# Patient Record
Sex: Female | Born: 1960 | Race: Black or African American | Hispanic: No | Marital: Married | Smoking: Current some day smoker
Health system: Southern US, Community
[De-identification: ages and names within clinical notes are randomized; demographics above are authoritative.]

## PROBLEM LIST (undated history)

## (undated) DIAGNOSIS — I2510 Atherosclerotic heart disease of native coronary artery without angina pectoris: Secondary | ICD-10-CM

## (undated) DIAGNOSIS — I10 Essential (primary) hypertension: Secondary | ICD-10-CM

## (undated) DIAGNOSIS — J449 Chronic obstructive pulmonary disease, unspecified: Secondary | ICD-10-CM

## (undated) DIAGNOSIS — F32A Depression, unspecified: Secondary | ICD-10-CM

## (undated) DIAGNOSIS — I5030 Unspecified diastolic (congestive) heart failure: Secondary | ICD-10-CM

## (undated) DIAGNOSIS — G5600 Carpal tunnel syndrome, unspecified upper limb: Secondary | ICD-10-CM

## (undated) DIAGNOSIS — M1990 Unspecified osteoarthritis, unspecified site: Secondary | ICD-10-CM

## (undated) DIAGNOSIS — G5700 Lesion of sciatic nerve, unspecified lower limb: Secondary | ICD-10-CM

## (undated) DIAGNOSIS — F329 Major depressive disorder, single episode, unspecified: Secondary | ICD-10-CM

## (undated) DIAGNOSIS — G43909 Migraine, unspecified, not intractable, without status migrainosus: Secondary | ICD-10-CM

## (undated) DIAGNOSIS — M797 Fibromyalgia: Secondary | ICD-10-CM

## (undated) DIAGNOSIS — E785 Hyperlipidemia, unspecified: Secondary | ICD-10-CM

## (undated) DIAGNOSIS — M810 Age-related osteoporosis without current pathological fracture: Secondary | ICD-10-CM

## (undated) DIAGNOSIS — G4730 Sleep apnea, unspecified: Secondary | ICD-10-CM

## (undated) HISTORY — DX: Lesion of sciatic nerve, unspecified lower limb: G57.00

## (undated) HISTORY — PX: APPENDECTOMY: SHX54

## (undated) HISTORY — DX: Carpal tunnel syndrome, unspecified upper limb: G56.00

## (undated) HISTORY — DX: Age-related osteoporosis without current pathological fracture: M81.0

---

## 2004-10-12 ENCOUNTER — Encounter: Admission: RE | Admit: 2004-10-12 | Discharge: 2004-10-12 | Payer: Self-pay | Admitting: Family Medicine

## 2005-08-24 ENCOUNTER — Other Ambulatory Visit: Admission: RE | Admit: 2005-08-24 | Discharge: 2005-08-24 | Payer: Self-pay | Admitting: Obstetrics and Gynecology

## 2005-09-28 ENCOUNTER — Encounter: Admission: RE | Admit: 2005-09-28 | Discharge: 2005-09-28 | Payer: Self-pay | Admitting: Obstetrics and Gynecology

## 2007-08-15 ENCOUNTER — Emergency Department: Payer: Self-pay | Admitting: Emergency Medicine

## 2010-08-19 ENCOUNTER — Observation Stay: Payer: Self-pay | Admitting: Surgery

## 2010-08-20 LAB — PATHOLOGY REPORT

## 2010-09-02 ENCOUNTER — Ambulatory Visit: Payer: Self-pay | Admitting: Surgery

## 2011-12-21 ENCOUNTER — Telehealth: Payer: Self-pay | Admitting: Hematology and Oncology

## 2011-12-21 NOTE — Telephone Encounter (Signed)
lmonvm for pt re calling me for appt w/LO 

## 2011-12-22 ENCOUNTER — Telehealth: Payer: Self-pay | Admitting: Hematology and Oncology

## 2011-12-22 NOTE — Telephone Encounter (Signed)
S/w pt re appt for 2/12 @ 10 am w/LO.

## 2011-12-24 ENCOUNTER — Telehealth: Payer: Self-pay | Admitting: Hematology and Oncology

## 2011-12-24 NOTE — Telephone Encounter (Signed)
Del.Chart °

## 2011-12-28 ENCOUNTER — Ambulatory Visit (HOSPITAL_BASED_OUTPATIENT_CLINIC_OR_DEPARTMENT_OTHER): Payer: Federal, State, Local not specified - PPO | Admitting: Hematology and Oncology

## 2011-12-28 ENCOUNTER — Ambulatory Visit: Payer: Self-pay

## 2011-12-28 ENCOUNTER — Ambulatory Visit: Payer: Federal, State, Local not specified - PPO | Admitting: Lab

## 2011-12-28 VITALS — BP 122/86 | HR 79 | Temp 97.1°F | Ht 65.0 in | Wt 174.8 lb

## 2011-12-28 DIAGNOSIS — R238 Other skin changes: Secondary | ICD-10-CM

## 2011-12-28 DIAGNOSIS — D472 Monoclonal gammopathy: Secondary | ICD-10-CM

## 2011-12-28 DIAGNOSIS — R233 Spontaneous ecchymoses: Secondary | ICD-10-CM

## 2011-12-28 LAB — CBC WITH DIFFERENTIAL/PLATELET
BASO%: 0.1 % (ref 0.0–2.0)
Eosinophils Absolute: 0.2 10*3/uL (ref 0.0–0.5)
HCT: 43.6 % (ref 34.8–46.6)
MCHC: 32.8 g/dL (ref 31.5–36.0)
MONO#: 0.4 10*3/uL (ref 0.1–0.9)
NEUT#: 6.9 10*3/uL — ABNORMAL HIGH (ref 1.5–6.5)
NEUT%: 77 % — ABNORMAL HIGH (ref 38.4–76.8)
WBC: 8.9 10*3/uL (ref 3.9–10.3)
lymph#: 1.4 10*3/uL (ref 0.9–3.3)

## 2011-12-28 NOTE — Progress Notes (Signed)
Dr.    Mila Palmer     -      Primary. Dr.    Fatima Sanger      -      Rheumatologist.  [facility]    At   [facility].    Rd.  Cell        [phone].

## 2011-12-28 NOTE — Progress Notes (Signed)
This office note has been dictated.

## 2011-12-28 NOTE — Progress Notes (Signed)
CC:   Emeterio Reeve, MD Pollyann Savoy, M.D.  IDENTIFYING STATEMENT:  The patient is a 51 year old woman seen at the request of Dr. Corliss Skains with an elevated IgG level.  HISTORY OF PRESENT ILLNESS:  Mrs. Spinola was recently diagnosed with fibromyalgia.  She also has history of migraines and myofascial pain. During her workup, she was found to have abnormal lab work.  The results obtained through Dr. Fatima Sanger office on 12/03/2011 noted SPEP with no monoclonal protein.  IgG was slightly elevated at 2,120.  IgA was normal at 296 and IgM was 252.  The patient does have generalized muscle aches and joint pain.  She has not lost weight.  She denies fever, chills, night sweats, and adenopathy.  She does note some easy bruising.  The patient also notes that her paternal aunts had similar complaints.  She denies history of menorrhagia or gum bleeding.  She has not had any rectal bleeding.  PAST MEDICAL HISTORY: 1. Fibromyalgia. 2. Migraines. 3. Myofascial pain syndrome. 4. Glaucoma. 5. Status post appendectomy.  ALLERGIES:  None.  MEDICATIONS: 1. Skelaxin 800 mg q.i.d. 2. Topamax 200 mg at bedtime. 3. Sertraline 50 mg daily. 4. Lyrica 50 mg p.r.n. q.i.d. 5. Vitamin D 50,000 units weekly.  SOCIAL HISTORY:  The patient is married with 1 child.  She works for the [employer]. She has smoked one-half pack per day for 20 years.  She drinks a glass of wine at night.  FAMILY HISTORY:  Patient's father had liver cancer.  Patient's paternal aunts had easy bruising.  No family history for hematological disorders or malignancy.  REVIEW OF SYSTEMS:  Constitutional:  She denies fever, chills, night sweats, anorexia, weight loss.  Cardiovascular:  She denies chest pain, PND, orthopnea, ankle swelling.  Respirations:  She denies cough, hemoptysis, wheeze, shortness of breath.  GI:  She denies nausea, vomiting, abdominal pain, diarrhea, melena, hematochezia.  GU:  She denies  dysuria, hematuria, nocturia, frequency.  Skin:  Notes easy bruising.  She denies joint effusions.  She admits to generalized muscle aches and pains.  Neurologic:  She denies headache, vision change, extremity weakness.  The rest of the review of systems is negative.  PHYSICAL EXAMINATION:  General Appearance:  The patient is alert and oriented x3.  Vital Signs:  Pulse 79.  Blood pressure 122/86.  Temp 97.1.  Respirations 16.  Weight 174 pounds.  HEENT:  Head is atraumatic, normocephalic.  Extraocular muscles are intact.  Sclerae are anicteric. Pupils are equal, round, and reactive to light.  Mouth is moist without ulcerations, thrush, or lesions.  Neck:  Supple without adenopathy and trachea is central.  Chest:  Demonstrates good air entry bilaterally. Clear to both percussion and auscultation.  Cardiovascular Exam:  First and second heart sounds were present.  No added sounds or murmurs. Abdomen:  Soft, nontender.  No hepatomegaly.  Bowel sounds present. Extremities:  No calf tenderness.  CNS:  Nonfocal.  IMPRESSION AND PLAN:  Mrs. Foxworthy is a 51 year old woman referred with an abnormal SPEP with slightly elevated IgG levels.  She was diagnosed with fibromyalgia.  We spent more than half the time discussing possible etiologies.  We also reviewed the causes of easy bruising.  Mrs. Jenison will be further evaluated with blood work.  She will proceed with repeat a CBC.  We will review morphology.  We will also obtain a comprehensive metabolic panel.  I suggest we do an SPEP with immunoglobulin levels and serum light chain.  We will also  obtain a bone survey.  She has a significant history for easy bruisability.  We will need to rule out von Willebrand's disease and, therefore, we will obtain a PT, PTT, and von Willebrand panel.  The patient had a number of questions, which were all answered to her satisfaction.  She follows up to discuss results and further  recommendations.    ______________________________ Laurice Record, M.D. LIO/MEDQ  D:  12/28/2011  T:  12/28/2011  Job:  [id number]

## 2011-12-31 LAB — COMPREHENSIVE METABOLIC PANEL
ALT: 21 U/L (ref 0–35)
CO2: 20 mEq/L (ref 19–32)
Calcium: 10.6 mg/dL — ABNORMAL HIGH (ref 8.4–10.5)
Chloride: 111 mEq/L (ref 96–112)
Creatinine, Ser: 0.85 mg/dL (ref 0.50–1.10)
Sodium: 141 mEq/L (ref 135–145)
Total Protein: 7.5 g/dL (ref 6.0–8.3)

## 2011-12-31 LAB — VON WILLEBRAND FACTOR MULTIMER: Factor-VIII Activity: 122 % (ref 50–180)

## 2011-12-31 LAB — KAPPA/LAMBDA LIGHT CHAINS: Kappa:Lambda Ratio: 1.01 (ref 0.26–1.65)

## 2011-12-31 LAB — SPEP & IFE WITH QIG
Alpha-2-Globulin: 9.1 % (ref 7.1–11.8)
Beta 2: 5.3 % (ref 3.2–6.5)
Beta Globulin: 8.9 % — ABNORMAL HIGH (ref 4.7–7.2)
Gamma Globulin: 15.7 % (ref 11.1–18.8)
IgG (Immunoglobin G), Serum: 1240 mg/dL (ref 690–1700)

## 2012-01-05 ENCOUNTER — Other Ambulatory Visit (HOSPITAL_COMMUNITY): Payer: Federal, State, Local not specified - PPO

## 2012-01-11 ENCOUNTER — Ambulatory Visit (HOSPITAL_BASED_OUTPATIENT_CLINIC_OR_DEPARTMENT_OTHER): Payer: Federal, State, Local not specified - PPO | Admitting: Hematology and Oncology

## 2012-01-11 ENCOUNTER — Encounter: Payer: Self-pay | Admitting: Hematology and Oncology

## 2012-01-11 ENCOUNTER — Ambulatory Visit: Payer: Federal, State, Local not specified - PPO | Admitting: Lab

## 2012-01-11 ENCOUNTER — Telehealth: Payer: Self-pay | Admitting: Hematology and Oncology

## 2012-01-11 VITALS — BP 128/90 | HR 66 | Temp 97.1°F | Ht 65.0 in | Wt 179.0 lb

## 2012-01-11 DIAGNOSIS — M797 Fibromyalgia: Secondary | ICD-10-CM | POA: Insufficient documentation

## 2012-01-11 DIAGNOSIS — I998 Other disorder of circulatory system: Secondary | ICD-10-CM

## 2012-01-11 DIAGNOSIS — IMO0001 Reserved for inherently not codable concepts without codable children: Secondary | ICD-10-CM

## 2012-01-11 DIAGNOSIS — D472 Monoclonal gammopathy: Secondary | ICD-10-CM

## 2012-01-11 DIAGNOSIS — T148XXA Other injury of unspecified body region, initial encounter: Secondary | ICD-10-CM

## 2012-01-11 DIAGNOSIS — R238 Other skin changes: Secondary | ICD-10-CM

## 2012-01-11 NOTE — Progress Notes (Signed)
CC:   Teresa Reeve, MD Teresa Roberts, M.D.  IDENTIFYING STATEMENT:  Patient is a 51 year old woman who presents to discuss results.  INTERIM HISTORY:  In summary, the patient was diagnosed with fibromyalgia and, during workup, was found to have elevated immunoglobulins.  She is here to discuss results.  In addition, she had reported easy bruisability.  Otherwise, she is fairly asymptomatic.  RESULTS:  On 12/28/2011 notes a white blood cell of 8.9, hemoglobin 14.3, hematocrit 43.6, platelets 235; morphology was essentially unremarkable with normal-appearing white blood cells, red blood cells and platelets; BUN and creatinine 18 and 0.85 respectively; calcium 10.6; serum protein electrophoresis did not reveal an M-spike, but the possibility of a faint restricted band could not be completely excluded. Immunoglobulins IgG was normal 1240, IgA was slightly elevated at 389, and IgM was normal at 231.  The patient did not proceed for bone survey. Von Willebrand multimers were essentially normal with factor VIII activity of 122%, von Willebrand's antigen 139, ristocetin cofactor normal at 88% and all multimers were present in normal amounts with no lab evidence for von Willebrand's disease.  MEDICATIONS:  Reviewed and updated.  ALLERGIES:  None.  Past medical history, family history, social history unchanged.  REVIEW OF SYSTEMS:  Ten-point review of systems negative.  PHYSICAL EXAM:  The patient is a well-appearing well-nourished woman in no distress.  Vitals:  Pulse 66, blood pressure 128/93, temperature 97.1, respirations 20, weight 179 pounds.  HEENT:  Head is atraumatic, normocephalic.  Sclerae anicteric.  Mouth moist.  Neck:  Supple.  Chest: Clear.  CVS:  First and second heart sounds present.  No added sounds or murmurs.  Abdomen:  Soft, nontender.  Bowel sounds present. Extremities:  No edema.  Skin:  No petechiae or bruising evident.  CNS: Nonfocal.  Lymph nodes:  No  adenopathy.  LAB DATA:  As above.  In addition, sodium 141, potassium 4.3, chloride 111, CO2 20, glucose 89, total bilirubin 0.4, alkaline phosphatase 120, AST 22, ALT 21.  IMPRESSION AND PLAN:  Teresa Roberts is a 51 year old woman with: 1. 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  I would like for her to go ahead and get her bone     survey which she needs to reschedule.  She has promised to do so.     We discussed that no treatment is necessary at this time except for     surveillance.  She thus follows up in a year's time with protein     markers and repeat bone survey. 2. Easy bruisability with normal coagulation and von Willebrand     multimers.  She will proceed for platelet aggregation studies.  We     will telephone her with those results.  I spent more than half the time coordinating care.    ______________________________ Laurice Record, M.D. LIO/MEDQ  D:  01/11/2012  T:  01/11/2012  Job:  [id number]

## 2012-01-11 NOTE — Patient Instructions (Signed)
Patient to follow up as instructed.  

## 2012-01-11 NOTE — Telephone Encounter (Signed)
Sent pt back to labs.  scheduled pt for bone survey for 01/18/2012 [facility] and 01/02/2013 @ [facility].  Gv pt appt for [phone]

## 2012-01-11 NOTE — Progress Notes (Signed)
This office note has been dictated.

## 2012-01-12 ENCOUNTER — Other Ambulatory Visit (HOSPITAL_BASED_OUTPATIENT_CLINIC_OR_DEPARTMENT_OTHER): Payer: Federal, State, Local not specified - PPO | Admitting: Lab

## 2012-01-12 DIAGNOSIS — M797 Fibromyalgia: Secondary | ICD-10-CM

## 2012-01-12 DIAGNOSIS — R238 Other skin changes: Secondary | ICD-10-CM

## 2012-01-12 DIAGNOSIS — T148XXA Other injury of unspecified body region, initial encounter: Secondary | ICD-10-CM

## 2012-01-12 DIAGNOSIS — D472 Monoclonal gammopathy: Secondary | ICD-10-CM

## 2012-01-14 LAB — PLATELET AGGREGATION STUDY, BLOOD: Ristocetin: DECREASED

## 2012-01-18 ENCOUNTER — Other Ambulatory Visit (HOSPITAL_COMMUNITY): Payer: Federal, State, Local not specified - PPO

## 2012-10-19 ENCOUNTER — Encounter (HOSPITAL_COMMUNITY): Payer: Self-pay

## 2012-10-19 ENCOUNTER — Emergency Department (HOSPITAL_COMMUNITY)
Admission: EM | Admit: 2012-10-19 | Discharge: 2012-10-19 | Discharge status: Against Medical Advice | Payer: Federal, State, Local not specified - PPO | Attending: Emergency Medicine | Admitting: Emergency Medicine

## 2012-10-19 DIAGNOSIS — F17200 Nicotine dependence, unspecified, uncomplicated: Secondary | ICD-10-CM | POA: Insufficient documentation

## 2012-10-19 DIAGNOSIS — Z5320 Procedure and treatment not carried out because of patient's decision for unspecified reasons: Secondary | ICD-10-CM | POA: Insufficient documentation

## 2012-10-19 HISTORY — DX: Fibromyalgia: M79.7

## 2012-10-19 HISTORY — DX: Migraine, unspecified, not intractable, without status migrainosus: G43.909

## 2012-10-19 NOTE — ED Notes (Signed)
Pt presents with 2 day h/o generalized body pain.  Pt reports h/o fibromyalgia and states this pain is worse, she is unable to get out of the bed, is unable to lift her arms.  Pt reports 3 days ago, she had episodes of vomiting and fever.  Pt reports pain medication that she is prescribed is not working.  Pt also reports migraine headache that began x 2 days ago.  Pt reports pain is occipital (where she normally gets pain) and that prescribed medication is not working either.

## 2012-10-19 NOTE — ED Notes (Signed)
Pt did not answer x 2 

## 2012-10-19 NOTE — ED Notes (Signed)
Pt did not answer x 1 

## 2012-11-18 ENCOUNTER — Telehealth: Payer: Self-pay | Admitting: Oncology

## 2012-11-18 NOTE — Telephone Encounter (Signed)
Talked to patient, she is in a funeral and out of town. She will call us to r/s appt to Dr. Arline Asp. I will also follow up with patient on Monday

## 2013-01-02 ENCOUNTER — Other Ambulatory Visit (HOSPITAL_COMMUNITY): Payer: Federal, State, Local not specified - PPO

## 2013-01-02 ENCOUNTER — Other Ambulatory Visit: Payer: Federal, State, Local not specified - PPO | Admitting: Lab

## 2013-01-09 ENCOUNTER — Ambulatory Visit: Payer: Federal, State, Local not specified - PPO | Admitting: Hematology and Oncology

## 2014-02-04 ENCOUNTER — Other Ambulatory Visit: Payer: Self-pay | Admitting: Family Medicine

## 2014-02-04 ENCOUNTER — Inpatient Hospital Stay (HOSPITAL_COMMUNITY): Admit: 2014-02-04 | Payer: Self-pay

## 2014-02-04 ENCOUNTER — Other Ambulatory Visit (HOSPITAL_COMMUNITY)
Admission: RE | Admit: 2014-02-04 | Discharge: 2014-02-04 | Disposition: A | Discharge status: Home / Self Care | Payer: 59 | Source: Ambulatory Visit | Attending: Family Medicine | Admitting: Family Medicine

## 2014-02-04 DIAGNOSIS — Z1151 Encounter for screening for human papillomavirus (HPV): Secondary | ICD-10-CM | POA: Insufficient documentation

## 2014-02-04 DIAGNOSIS — Z124 Encounter for screening for malignant neoplasm of cervix: Secondary | ICD-10-CM | POA: Insufficient documentation

## 2014-04-15 ENCOUNTER — Telehealth: Payer: Self-pay | Admitting: Internal Medicine

## 2014-04-15 NOTE — Telephone Encounter (Signed)
S/W PATIENT AND GAVE NP APPT FOR 06/02 @ 10:30 W/DR. CHISM.  REFERRING -[facility] DX- MGUS

## 2014-04-15 NOTE — Telephone Encounter (Signed)
C/D 04/15/14 for appt. 04/16/14

## 2014-04-16 ENCOUNTER — Other Ambulatory Visit: Payer: 59

## 2014-04-16 ENCOUNTER — Ambulatory Visit (HOSPITAL_BASED_OUTPATIENT_CLINIC_OR_DEPARTMENT_OTHER): Payer: 59

## 2014-04-16 ENCOUNTER — Encounter: Payer: Self-pay | Admitting: Internal Medicine

## 2014-04-16 ENCOUNTER — Ambulatory Visit: Payer: Federal, State, Local not specified - PPO

## 2014-04-16 ENCOUNTER — Telehealth: Payer: Self-pay | Admitting: Internal Medicine

## 2014-04-16 ENCOUNTER — Ambulatory Visit (HOSPITAL_BASED_OUTPATIENT_CLINIC_OR_DEPARTMENT_OTHER): Payer: 59 | Admitting: Internal Medicine

## 2014-04-16 VITALS — BP 119/86 | HR 83 | Temp 98.1°F | Resp 18 | Ht 65.0 in | Wt 189.7 lb

## 2014-04-16 DIAGNOSIS — D472 Monoclonal gammopathy: Secondary | ICD-10-CM

## 2014-04-16 LAB — CBC WITH DIFFERENTIAL/PLATELET
BASO%: 1.2 % (ref 0.0–2.0)
BASOS ABS: 0.1 10*3/uL (ref 0.0–0.1)
EOS ABS: 0.1 10*3/uL (ref 0.0–0.5)
EOS%: 1.6 % (ref 0.0–7.0)
HCT: 50.5 % — ABNORMAL HIGH (ref 34.8–46.6)
HEMOGLOBIN: 16.3 g/dL — AB (ref 11.6–15.9)
LYMPH%: 16.9 % (ref 14.0–49.7)
MCH: 31.8 pg (ref 25.1–34.0)
MCHC: 32.3 g/dL (ref 31.5–36.0)
MCV: 98.4 fL (ref 79.5–101.0)
MONO#: 0.5 10*3/uL (ref 0.1–0.9)
MONO%: 6.2 % (ref 0.0–14.0)
NEUT%: 74.1 % (ref 38.4–76.8)
NEUTROS ABS: 6.1 10*3/uL (ref 1.5–6.5)
PLATELETS: 213 10*3/uL (ref 145–400)
RBC: 5.13 10*6/uL (ref 3.70–5.45)
RDW: 18.3 % — AB (ref 11.2–14.5)
WBC: 8.2 10*3/uL (ref 3.9–10.3)
lymph#: 1.4 10*3/uL (ref 0.9–3.3)

## 2014-04-16 LAB — COMPREHENSIVE METABOLIC PANEL (CC13)
ALK PHOS: 108 U/L (ref 40–150)
ALT: 11 U/L (ref 0–55)
AST: 12 U/L (ref 5–34)
Albumin: 3.5 g/dL (ref 3.5–5.0)
Anion Gap: 14 mEq/L — ABNORMAL HIGH (ref 3–11)
BUN: 12.2 mg/dL (ref 7.0–26.0)
CALCIUM: 9.2 mg/dL (ref 8.4–10.4)
CO2: 21 mEq/L — ABNORMAL LOW (ref 22–29)
Chloride: 106 mEq/L (ref 98–109)
Creatinine: 0.8 mg/dL (ref 0.6–1.1)
Glucose: 88 mg/dl (ref 70–140)
Potassium: 4.3 mEq/L (ref 3.5–5.1)
Sodium: 142 mEq/L (ref 136–145)
Total Bilirubin: 0.43 mg/dL (ref 0.20–1.20)
Total Protein: 7.2 g/dL (ref 6.4–8.3)

## 2014-04-16 LAB — LACTATE DEHYDROGENASE (CC13): LDH: 224 U/L (ref 125–245)

## 2014-04-16 NOTE — Progress Notes (Signed)
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  She was last seen by Dr. Sophronia Simas on 01/17/2012. She saw Dr. Stephanie Acre on 04/11/2014.   She was diagnosed with fibromyalgia in 2013. During her workup, she was found to have abnormal lab work. The results obtained through Dr. Arlean Hopping office on 12/03/2011 noted SPEP with no monoclonal protein. IgG was slightly elevated at 2,120. IgA was normal at 296 and IgM was 252. She was seen by Dr. Jamse Arn on 12/28/2011.  The results of her studies are as follows: A white blood cell of 8.9, hemoglobin 14.3, hematocrit 43.6, platelets 235; morphology was essentially unremarkable with normal-appearing white blood cells, red blood cells and platelets; BUN and creatinine 18 and 0.85 respectively; calcium 10.6; serum protein electrophoresis did not reveal an M-spike, but the possibility of a faint restricted band could not be completely excluded. Immunoglobulins IgG was normal 1240, IgA was slightly elevated at 389, and IgM was normal at 231. The patient did not proceed for bone survey. Von Willebrand multimers were essentially normal with factor VIII activity of 122%, von Willebrand's antigen 139, ristocetin cofactor normal at 88% and all multimers were present in normal amounts with no lab evidence for von Willebrand's disease. A bone survey was ordered but not obtained. She has been lost to follow up.   Today, she reports continued pain in her arms, legs, back and legs.  She describes it as achy, sharp that is intermittent.  She started walking and exercising but the pain makes this not tolerable. She denies  fevers but does report night sweats.  She denies weight changes.  She is up-to-date for her mammogram (done at the [facility]) in August.  Dr. Stephanie Acre did her annual PAP in April of this year (reportedly negative for malignancy).  She has had a colonoscopy in 2012 with one polyp.  She cannot recall her GI group. She denies kidney problems.  She denies rectal bleeding.  Her last menstrual period was in 2014.  She is on disability since December, 2015.  She smokes one pack per day for the past twenty years.   PAST MEDICAL HISTORY:  has a past medical history of Migraines and Fibromyalgia.     PAST SURGICAL HISTORY: Past Surgical History  Procedure Laterality Date  . Cesarean section       CURRENT MEDICATIONS: has a current medication list which includes the following prescription(s): duloxetine, ergocalciferol, metaxalone, and pregabalin.   ALLERGIES: Review of patient's allergies indicates no known allergies.   SOCIAL HISTORY:  reports that she has been smoking.  She does not have any smokeless tobacco history on file. She reports that she drinks about .6 ounces of alcohol per week. She reports that she does not use illicit drugs.   FAMILY HISTORY: family history is not on file.   LABORATORY DATA:  CBC    Component Value Date/Time   WBC 8.2 04/16/2014 1212   RBC 5.13 04/16/2014 1212   HGB 16.3* 04/16/2014 1212   HCT 50.5* 04/16/2014 1212   PLT 213 04/16/2014 1212   MCV 98.4 04/16/2014 1212   MCH 31.8 04/16/2014 1212  MCHC 32.3 04/16/2014 1212   RDW 18.3* 04/16/2014 1212   LYMPHSABS 1.4 04/16/2014 1212   MONOABS 0.5 04/16/2014 1212   EOSABS 0.1 04/16/2014 1212   BASOSABS 0.1 04/16/2014 1212   CMP     Component Value Date/Time   NA 142 04/16/2014 1213   NA 141 12/28/2011 1137   K 4.3 04/16/2014 1213   K 4.3 12/28/2011 1137   CL 111 12/28/2011 1137   CO2 21* 04/16/2014 1213   CO2 20 12/28/2011 1137   GLUCOSE 88 04/16/2014 1213   GLUCOSE 89 12/28/2011 1137   BUN 12.2 04/16/2014 1213   BUN 18 12/28/2011  1137   CREATININE 0.8 04/16/2014 1213   CREATININE 0.85 12/28/2011 1137   CALCIUM 9.2 04/16/2014 1213   CALCIUM 10.6* 12/28/2011 1137   PROT 7.2 04/16/2014 1213   PROT 7.5 12/28/2011 1137   ALBUMIN 3.5 04/16/2014 1213   ALBUMIN 4.3 12/28/2011 1137   AST 12 04/16/2014 1213   AST 22 12/28/2011 1137   ALT 11 04/16/2014 1213   ALT 21 12/28/2011 1137   ALKPHOS 108 04/16/2014 1213   ALKPHOS 120* 12/28/2011 1137   BILITOT 0.43 04/16/2014 1213   BILITOT 0.4 12/28/2011 1137   Results for Umholtz, Irasema (MRN [id number]) as of 04/16/2014 14:30  Ref. Range 04/16/2014 12:12  [facility] Latest Range: 125-245 U/L 224     RADIOGRAPHY: No results found.     REVIEW OF SYSTEMS:  Constitutional: Denies fevers, chills or abnormal weight loss; per HPI.  She has fibromyalgia with chronic generalized pain.  Eyes: Denies blurriness of vision Ears, nose, mouth, throat, and face: Denies mucositis or sore throat Respiratory: Denies cough, dyspnea or wheezes Cardiovascular: Denies palpitation, chest discomfort or lower extremity swelling Gastrointestinal:  Denies nausea, heartburn or change in bowel habits Skin: Denies abnormal skin rashes Lymphatics: Denies new lymphadenopathy or easy bruising Neurological:Denies numbness, tingling or new weaknesses Behavioral/Psych: Mood is stable, no new changes  All other systems were reviewed with the patient and are negative.  PHYSICAL EXAM:  height is 5' 5"  (1.651 m) and weight is 189 lb 11.2 oz (86.047 kg). Her oral temperature is 98.1 F (36.7 C). Her blood pressure is 119/86 and her pulse is 83. Her respiration is 18 and oxygen saturation is 91%.    GENERAL:alert, no distress and comfortable; well developed and well nourished. Mildly obese.  SKIN: skin color, texture, turgor are normal, no rashes or significant lesions EYES: normal, Conjunctiva are pink and non-injected, sclera clear OROPHARYNX:no exudate, no erythema and lips, buccal mucosa, and tongue normal  NECK: supple, thyroid normal  size, non-tender, without nodularity LYMPH:  no palpable lymphadenopathy in the cervical, axillary or inguinal LUNGS: clear to auscultation and percussion with normal breathing effort HEART: regular rate & rhythm and no murmurs and no lower extremity edema ABDOMEN:abdomen soft, non-tender and normal bowel sounds Musculoskeletal:no cyanosis of digits and no clubbing  NEURO: alert & oriented x 3 with fluent speech, no focal motor/sensory deficits   IMPRESSION: Brendan Gruwell is a 53 y.o. female with a history of    PLAN:  1.  IgA MGUS.  --We will exclude Multiple myeloma or smothering MM by checking an UPEP with IFE, kappa lambda light chains and 24 hour total protein. In addition, we will evaluate her bones with a skeletal survey. She has a normal calcemia (9.2), creatinine and hemoglobin (slightly elevated at 16.3). We will have her back to discuss the results of her skeletal survey if positive at her request.  --  We discussed that patients with MGUS have a one percent per year chance of developing multiple myeloma. We discussed that no treatment is necessary at this time except for surveillance pending review of her studies as noted above.   2. Tobacco Abuse. --Patient declines tobacco cessation class. She was counseled extensively on the benefits of not smoking.   3. Follow-up. -Patient will have follow up in 6 months then annually if her MM markers are stable and no organ dysfunction.  We will repeat CBC and chemistries and multiple myeloma markers.   All questions were answered. The patient knows to call the clinic with any problems, questions or concerns. We can certainly see the patient much sooner if necessary.  I spent 25 minutes counseling the patient face to face. The total time spent in the appointment was 45 minutes.    Concha Norway, MD 04/16/2014 2:36 PM

## 2014-04-16 NOTE — Progress Notes (Signed)
Checked in new patient with no financial issues. She has not seen the dr. She has not been out of the country and didn't want a photo taken. She has appt card.,

## 2014-04-16 NOTE — Telephone Encounter (Signed)
gv adn printed appt sched and avs for pt for Sept due to pt moving to [facility]...sent pt to lab

## 2014-04-16 NOTE — Patient Instructions (Signed)
Menopause Menopause is the normal time of life when menstrual periods stop completely. Menopause is complete when you have missed 12 consecutive menstrual periods. It usually occurs between the ages of 8 years and 48 years. Very rarely does a woman develop menopause before the age of 60 years. At menopause, your ovaries stop producing the female hormones estrogen and progesterone. This can cause undesirable symptoms and also affect your health. Sometimes the symptoms may occur 4 5 years before the menopause begins. There is no relationship between menopause and:  Oral contraceptives.  Number of children you had.  Race.  The age your menstrual periods started (menarche). Heavy smokers and very thin women may develop menopause earlier in life. CAUSES  The ovaries stop producing the female hormones estrogen and progesterone.  Other causes include:  Surgery to remove both ovaries.  The ovaries stop functioning for no known reason.  Tumors of the pituitary gland in the brain.  Medical disease that affects the ovaries and hormone production.  Radiation treatment to the abdomen or pelvis.  Chemotherapy that affects the ovaries. SYMPTOMS   Hot flashes.  Night sweats.  Decrease in sex drive.  Vaginal dryness and thinning of the vagina causing painful intercourse.  Dryness of the skin and developing wrinkles.  Headaches.  Tiredness.  Irritability.  Memory problems.  Weight gain.  Bladder infections.  Hair growth of the face and chest.  Infertility. More serious symptoms include:  Loss of bone (osteoporosis) causing breaks (fractures).  Depression.  Hardening and narrowing of the arteries (atherosclerosis) causing heart attacks and strokes. DIAGNOSIS   When the menstrual periods have stopped for 12 straight months.  Physical exam.  Hormone studies of the blood. TREATMENT  There are many treatment choices and nearly as many questions about them. The  decisions to treat or not to treat menopausal changes is an individual choice made with your health care provider. Your health care provider can discuss the treatments with you. Together, you can decide which treatment will work best for you. Your treatment choices may include:   Hormone therapy (estrogen and progesterone).  Non-hormonal medicines.  Treating the individual symptoms with medicine (for example antidepressants for depression).  Herbal medicines that may help specific symptoms.  Counseling by a psychiatrist or psychologist.  Group therapy.  Lifestyle changes including:  Eating healthy.  Regular exercise.  Limiting caffeine and alcohol.  Stress management and meditation.  No treatment. HOME CARE INSTRUCTIONS   Take the medicine your health care provider gives you as directed.  Get plenty of sleep and rest.  Exercise regularly.  Eat a diet that contains calcium (good for the bones) and soy products (acts like estrogen hormone).  Avoid alcoholic beverages.  Do not smoke.  If you have hot flashes, dress in layers.  Take supplements, calcium, and vitamin D to strengthen bones.  You can use over-the-counter lubricants or moisturizers for vaginal dryness.  Group therapy is sometimes very helpful.  Acupuncture may be helpful in some cases. SEEK MEDICAL CARE IF:   You are not sure you are in menopause.  You are having menopausal symptoms and need advice and treatment.  You are still having menstrual periods after age 71 years.  You have pain with intercourse.  Menopause is complete (no menstrual period for 12 months) and you develop vaginal bleeding.  You need a referral to a specialist (gynecologist, psychiatrist, or psychologist) for treatment. SEEK IMMEDIATE MEDICAL CARE IF:   You have severe depression.  You have excessive vaginal bleeding.  You fell and think you have a broken bone.  You have pain when you urinate.  You develop leg or  chest pain.  You have a fast pounding heart beat (palpitations).  You have severe headaches.  You develop vision problems.  You feel a lump in your breast.  You have abdominal pain or severe indigestion. Document Released: 01/22/2004 Document Revised: 07/04/2013 Document Reviewed: 05/31/2013 [facility] Patient Information 2014 [place], [place].

## 2014-04-18 LAB — SPEP & IFE WITH QIG
ALBUMIN ELP: 55.6 % — AB (ref 55.8–66.1)
ALPHA-2-GLOBULIN: 9 % (ref 7.1–11.8)
Alpha-1-Globulin: 4.8 % (ref 2.9–4.9)
BETA 2: 5.4 % (ref 3.2–6.5)
Beta Globulin: 9.7 % — ABNORMAL HIGH (ref 4.7–7.2)
Gamma Globulin: 15.5 % (ref 11.1–18.8)
IGA: 386 mg/dL — AB (ref 69–380)
IgG (Immunoglobin G), Serum: 1220 mg/dL (ref 690–1700)
IgM, Serum: 191 mg/dL (ref 52–322)
M-Spike, %: 0.33 g/dL
Total Protein, Serum Electrophoresis: 6.9 g/dL (ref 6.0–8.3)

## 2014-04-18 LAB — KAPPA/LAMBDA LIGHT CHAINS
Kappa free light chain: 1.43 mg/dL (ref 0.33–1.94)
Kappa:Lambda Ratio: 1.3 (ref 0.26–1.65)
LAMBDA FREE LGHT CHN: 1.1 mg/dL (ref 0.57–2.63)

## 2014-04-22 ENCOUNTER — Ambulatory Visit (HOSPITAL_COMMUNITY)
Admission: RE | Admit: 2014-04-22 | Discharge: 2014-04-22 | Disposition: A | Discharge status: Home / Self Care | Payer: 59 | Source: Ambulatory Visit | Attending: Internal Medicine | Admitting: Internal Medicine

## 2014-04-22 DIAGNOSIS — D472 Monoclonal gammopathy: Secondary | ICD-10-CM | POA: Insufficient documentation

## 2014-05-21 LAB — UIFE/LIGHT CHAINS/TP QN, 24-HR UR
ALBUMIN, U: DETECTED
ALPHA 1 UR: DETECTED — AB
ALPHA 2 UR: DETECTED — AB
Beta, Urine: DETECTED — AB
FREE KAPPA/LAMBDA RATIO: 9.85 ratio (ref 2.04–10.37)
Free Kappa Lt Chains,Ur: 8.57 mg/dL — ABNORMAL HIGH (ref 0.14–2.42)
Free Lambda Excretion/Day: 13.49 mg/d
Free Lambda Lt Chains,Ur: 0.87 mg/dL — ABNORMAL HIGH (ref 0.02–0.67)
Free Lt Chn Excr Rate: 132.84 mg/d
Gamma Globulin, Urine: DETECTED — AB
Time: 24 hours
Total Protein, Urine-Ur/day: 163 mg/d — ABNORMAL HIGH (ref 10–140)
Total Protein, Urine: 10.5 mg/dL
Volume, Urine: 1550 mL

## 2014-05-24 ENCOUNTER — Telehealth: Payer: Self-pay

## 2014-05-24 NOTE — Telephone Encounter (Signed)
Pt called for results of labs drawn 6/2, labs printed and given to Dr Juliann Mule to interpret and call pt.

## 2014-05-30 ENCOUNTER — Telehealth: Payer: Self-pay | Admitting: Internal Medicine

## 2014-05-30 NOTE — Telephone Encounter (Signed)
lvm for pt regarding to appt .Marland KitchenMarland Kitchenpt requested soon appt

## 2014-05-31 ENCOUNTER — Telehealth: Payer: Self-pay | Admitting: Internal Medicine

## 2014-05-31 ENCOUNTER — Encounter: Payer: Self-pay | Admitting: Internal Medicine

## 2014-05-31 ENCOUNTER — Ambulatory Visit (HOSPITAL_BASED_OUTPATIENT_CLINIC_OR_DEPARTMENT_OTHER): Payer: 59 | Admitting: Internal Medicine

## 2014-05-31 VITALS — BP 129/85 | HR 90 | Temp 98.2°F | Resp 18 | Ht 65.0 in | Wt 189.2 lb

## 2014-05-31 DIAGNOSIS — D472 Monoclonal gammopathy: Secondary | ICD-10-CM

## 2014-05-31 DIAGNOSIS — F17200 Nicotine dependence, unspecified, uncomplicated: Secondary | ICD-10-CM

## 2014-05-31 DIAGNOSIS — IMO0001 Reserved for inherently not codable concepts without codable children: Secondary | ICD-10-CM

## 2014-05-31 NOTE — Progress Notes (Signed)
[facility] OFFICE PROGRESS NOTE  Lilian Coma, MD [address] [place] [address]  DIAGNOSIS: MGUS (monoclonal gammopathy of unknown significance) - Plan: CBC with Differential, Comprehensive metabolic panel (Cmet) - [facility], Lactate dehydrogenase (LDH) - [facility], Kappa/lambda light chains, SPEP & IFE with QIG  Chief Complaint  Patient presents with  . MGUS (monoclonal gammopathy of unknown significance)    CURRENT TREATMENT: Observation.  INTERVAL HISTORY: Teresa Roberts 53 y.o. 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  She was last seen by me on 04/16/2014. She was last seen by Dr. Sophronia Simas on 01/17/2012. She saw Dr. Stephanie Acre on 04/11/2014.   She was diagnosed with fibromyalgia in 2013. During her workup, she was found to have abnormal lab work. The results obtained through Dr. Arlean Hopping office on 12/03/2011 noted SPEP with no monoclonal protein. IgG was slightly elevated at 2,120. IgA was normal at 296 and IgM was 252. She was seen by Dr. Jamse Arn on 12/28/2011. The results of her studies are as follows: A white blood cell of 8.9, hemoglobin 14.3, hematocrit 43.6, platelets 235; morphology was essentially unremarkable with normal-appearing white blood cells, red blood cells and platelets; BUN and creatinine 18 and 0.85 respectively; calcium 10.6; serum protein electrophoresis did not reveal an M-spike, but the possibility of a faint restricted band could not be completely excluded. Immunoglobulins IgG was normal 1240, IgA was slightly elevated at 389, and IgM was normal at 231. The patient did not proceed for bone survey. Von Willebrand multimers were essentially normal with factor VIII activity of 122%, von Willebrand's antigen 139, ristocetin cofactor normal at 88% and all multimers were present in normal amounts with no lab evidence for von Willebrand's disease. A bone survey was ordered but not obtained. .   Today, she is  accompanied by her husband Shalandria Elsbernd. She reports continued pain in her arms, legs, back and legs that she attributes to her fibromyalgia. She describes it as achy, sharp that is intermittent. She started walking and exercising but the pain makes this not tolerable. She denies fevers but does report night sweats. She denies weight changes. She is up-to-date for her mammogram (done at the [facility] hospital) in August. Dr. Stephanie Acre did her annual PAP in April of this year (reportedly negative for malignancy). She has had a colonoscopy in 2012 with one polyp. She cannot recall her GI group. She denies kidney problems. She denies rectal bleeding. She is on disability since December, 2015. She smokes one pack per day for the past twenty years.    MEDICAL HISTORY: Past Medical History  Diagnosis Date  . Migraines   . Fibromyalgia     INTERIM HISTORY: has MGUS (monoclonal gammopathy of unknown significance); Fibromyalgia; and Bruises easily on her problem list.    ALLERGIES:  has No Known Allergies.  MEDICATIONS: has a current medication list which includes the following prescription(s): amlodipine, duloxetine, ergocalciferol, metaxalone, multivitamin, and pregabalin.  SURGICAL HISTORY:  Past Surgical History  Procedure Laterality Date  . Cesarean section      REVIEW OF SYSTEMS:   Constitutional: Denies fevers, chills or abnormal weight loss Eyes: Denies blurriness of vision Ears, nose, mouth, throat, and face: Denies mucositis or sore throat Respiratory: Denies cough, dyspnea or wheezes Cardiovascular: Denies palpitation, chest discomfort or lower extremity swelling Gastrointestinal:  Denies nausea, heartburn or change in bowel habits Skin: Denies abnormal skin rashes Lymphatics: Denies new lymphadenopathy or easy bruising Neurological:Denies numbness, tingling or  new weaknesses Behavioral/Psych: Mood is stable, no new changes  All other systems were reviewed with the patient and are  negative.  PHYSICAL EXAMINATION: ECOG PERFORMANCE STATUS: 0 - Asymptomatic  Blood pressure 129/85, pulse 90, temperature 98.2 F (36.8 C), temperature source Oral, resp. rate 18, height _0  (1.651 m), weight 189 lb 3.2 oz (85.821 kg), last menstrual period 08/19/2012.  GENERAL:alert, no distress and comfortable; well developed and well nourished.  SKIN: skin color, texture, turgor are normal, no rashes or significant lesions EYES: normal, Conjunctiva are pink and non-injected, sclera clear OROPHARYNX:no exudate, no erythema and lips, buccal mucosa, and tongue normal  NECK: supple, thyroid normal size, non-tender, without nodularity LYMPH:  no palpable lymphadenopathy in the cervical, axillary or supraclavicular LUNGS: clear to auscultation with normal breathing effort, no wheezes or rhonchi HEART: regular rate & rhythm and no murmurs and no lower extremity edema ABDOMEN:abdomen soft, non-tender and normal bowel sounds Musculoskeletal:no cyanosis of digits and no clubbing  NEURO: alert & oriented x 3 with fluent speech, no focal motor/sensory deficits  Labs:  Lab Results  Component Value Date   WBC 8.2 04/16/2014   HGB 16.3* 04/16/2014   HCT 50.5* 04/16/2014   MCV 98.4 04/16/2014   PLT 213 04/16/2014   NEUTROABS 6.1 04/16/2014      Chemistry      Component Value Date/Time   NA 142 04/16/2014 1213   NA 141 12/28/2011 1137   K 4.3 04/16/2014 1213   K 4.3 12/28/2011 1137   CL 111 12/28/2011 1137   CO2 21* 04/16/2014 1213   CO2 20 12/28/2011 1137   BUN 12.2 04/16/2014 1213   BUN 18 12/28/2011 1137   CREATININE 0.8 04/16/2014 1213   CREATININE 0.85 12/28/2011 1137      Component Value Date/Time   CALCIUM 9.2 04/16/2014 1213   CALCIUM 10.6* 12/28/2011 1137   ALKPHOS 108 04/16/2014 1213   ALKPHOS 120* 12/28/2011 1137   AST 12 04/16/2014 1213   AST 22 12/28/2011 1137   ALT 11 04/16/2014 1213   ALT 21 12/28/2011 1137   BILITOT 0.43 04/16/2014 1213   BILITOT 0.4 12/28/2011 1137     Results for Olver, Deslyn  (MRN [id number]) as of 05/31/2014 13:24  Ref. Range 04/16/2014 12:12  Albumin ELP Latest Range: 55.8-66.1 % 55.6 (L)  COMMENT (PROTEIN ELECTROPHOR) No range found *  Alpha-1-Globulin Latest Range: 2.9-4.9 % 4.8  Alpha-2-Globulin Latest Range: 7.1-11.8 % 9.0  Beta Globulin Latest Range: 4.7-7.2 % 9.7 (H)  Beta 2 Latest Range: 3.2-6.5 % 5.4  Gamma Globulin Latest Range: 11.1-18.8 % 15.5  M-SPIKE, % No range found 0.33  SPE Interp. No range found *  IgG (Immunoglobin G), Serum Latest Range: [id number] mg/dL 1220  IgA Latest Range: 69-380 mg/dL 386 (H)  IgM, Serum Latest Range: 52-322 mg/dL 191  Total Protein, Serum Electrophoresis Latest Range: 6.0-8.3 g/dL 6.9  Kappa free light chain Latest Range: 0.33-1.94 mg/dL 1.43  Lambda Free Lght Chn Latest Range: 0.57-2.63 mg/dL 1.10  Kappa:Lambda Ratio Latest Range: 0.26-1.65  1.30   Results for LATEIA, FRASER (MRN [id number]) as of 05/31/2014 13:24  Ref. Range 05/16/2014 08:57  Time-UPE24 No range found 24  Volume, Urine-UPE24 No range found 1550  Total Protein, Urine-UPE24 No range found 10.5  Total Protein, Urine-Ur/day Latest Range: 10-140 mg/day 163 (H)  ALBUMIN, U Latest Range: DETECTED  DETECTED  Alpha 1, Urine Latest Range: NONE DET  DETECTED (A)  Alpha 2, Urine Latest Range: NONE DET  DETECTED (A)  Beta, Urine Latest Range: NONE DET  DETECTED (A)  Gamma Globulin, Urine Latest Range: NONE DET  DETECTED (A)  Free Kappa Lt Chains,Ur Latest Range: 0.14-2.42 mg/dL 8.57 (H)  Free Lt Chn Excr Rate No range found 132.84  Free Lambda Lt Chains,Ur Latest Range: 0.02-0.67 mg/dL 0.87 (H)  Free Lambda Excretion/Day No range found 13.49  Free Kappa/Lambda Ratio Latest Range: 2.04-10.37 ratio 3.66   Basic Metabolic Panel: No results found for this basename: NA, K, CL, CO2, GLUCOSE, BUN, CREATININE, CALCIUM, MG, PHOS,  in the last 168 hours GFR Estimated Creatinine Clearance: 89 ml/min (by C-G formula based on Cr of 0.8). Liver Function Tests: No results  found for this basename: AST, ALT, ALKPHOS, BILITOT, PROT, ALBUMIN,  in the last 168 hours No results found for this basename: LIPASE, AMYLASE,  in the last 168 hours No results found for this basename: AMMONIA,  in the last 168 hours Coagulation profile No results found for this basename: INR, PROTIME,  in the last 168 hours  CBC: No results found for this basename: WBC, NEUTROABS, HGB, HCT, MCV, PLT,  in the last 168 hours  Anemia work up No results found for this basename: VITAMINB12, FOLATE, FERRITIN, TIBC, IRON, RETICCTPCT,  in the last 72 hours  Studies:  No results found.   RADIOGRAPHIC STUDIES: DG Bone Survey Met (04/22/2014) METASTATIC BONE SURVEY  COMPARISON: None. FINDINGS: Calvarium appears within normal limits. Normal appearance of the bones of both upper extremities. No osteolytic lesions. Old left  lateral sixth rib fracture. Spinal column appears within normal limits. Bones of the left lower extremity appear normal. 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 Right leg appears normal.  IMPRESSION: No evidence of myeloma or plasmacytoma.  ASSESSMENT: Teresa Roberts 53 y.o. female with a history of MGUS (monoclonal gammopathy of unknown significance) - Plan: CBC with Differential, Comprehensive metabolic panel (Cmet) - [facility], Lactate dehydrogenase (LDH) - [facility], Kappa/lambda light chains, SPEP & IFE with QIG   PLAN:  1. IgA MGUS.  --We excluded Multiple myeloma or smothering MM by checking an UPEP with IFE, kappa lambda light chains and 24 hour total protein as noted above. Her skeletal survey was also negative for bone lesions.  Labs from last visit demonstrated a normal calcium (9.2), creatinine and hemoglobin (slightly elevated at 16.3).   --We discussed that patients with MGUS have a one percent per year chance of developing multiple myeloma. We discussed that no treatment  is necessary at this time except for surveillance.   2. Tobacco Abuse.  --Patient declines tobacco cessation class. She was counseled extensively on the benefits of not smoking.   3. Follow-up.  -Patient will have follow up in 6 months then annually if her MM markers are stable and no organ dysfunction. We will repeat CBC and chemistries and multiple myeloma markers.   All questions were answered. The patient knows to call the clinic with any problems, questions or concerns. We can certainly see the patient much sooner if necessary.  I spent 15 minutes counseling the patient face to face. The total time spent in the appointment was 25 minutes.    Tanayia Wahlquist, MD 05/31/2014 3:22 PM

## 2014-05-31 NOTE — Patient Instructions (Signed)
Fibromyalgia Fibromyalgia is a disorder that is often misunderstood. It is associated with muscular pains and tenderness that comes and goes. It is often associated with fatigue and sleep disturbances. Though it tends to be long-lasting, fibromyalgia is not life-threatening. CAUSES  The exact cause of fibromyalgia is unknown. People with certain gene types are predisposed to developing fibromyalgia and other conditions. Certain factors can play a role as triggers, such as:  Spine disorders.  Arthritis.  Severe injury (trauma) and other physical stressors.  Emotional stressors. SYMPTOMS   The main symptom is pain and stiffness in the muscles and joints, which can vary over time.  Sleep and fatigue problems. Other related symptoms may include:  Bowel and bladder problems.  Headaches.  Visual problems.  Problems with odors and noises.  Depression or mood changes.  Painful periods (dysmenorrhea).  Dryness of the skin or eyes. DIAGNOSIS  There are no specific tests for diagnosing fibromyalgia. Patients can be diagnosed accurately from the specific symptoms they have. The diagnosis is made by determining that nothing else is causing the problems. TREATMENT  There is no cure. Management includes medicines and an active, healthy lifestyle. The goal is to enhance physical fitness, decrease pain, and improve sleep. HOME CARE INSTRUCTIONS   Only take over-the-counter or prescription medicines as directed by your caregiver. Sleeping pills, tranquilizers, and pain medicines may make your problems worse.  Low-impact aerobic exercise is very important and advised for treatment. At first, it may seem to make pain worse. Gradually increasing your tolerance will overcome this feeling.  Learning relaxation techniques and how to control stress will help you. Biofeedback, visual imagery, hypnosis, muscle relaxation, yoga, and meditation are all options.  Anti-inflammatory medicines and  physical therapy may provide short-term help.  Acupuncture or massage treatments may help.  Take muscle relaxant medicines as suggested by your caregiver.  Avoid stressful situations.  Plan a healthy lifestyle. This includes your diet, sleep, rest, exercise, and friends.  Find and practice a hobby you enjoy.  Join a fibromyalgia support group for interaction, ideas, and sharing advice. This may be helpful. SEEK MEDICAL CARE IF:  You are not having good results or improvement from your treatment. FOR MORE INFORMATION  National Fibromyalgia Association: www.fmaware.org Arthritis Foundation: www.arthritis.org Document Released: 11/01/2005 Document Revised: 01/24/2012 Document Reviewed: 02/11/2010 ExitCare Patient Information 2015 [employer]. This information is not intended to replace advice given to you by your health care provider. Make sure you discuss any questions you have with your health care provider.  

## 2014-05-31 NOTE — Telephone Encounter (Signed)
gv and printed appt sched adn avs for pt for Jan 2016

## 2014-07-30 ENCOUNTER — Ambulatory Visit: Payer: 59

## 2014-07-30 ENCOUNTER — Other Ambulatory Visit: Payer: 59

## 2014-08-02 ENCOUNTER — Telehealth: Payer: Self-pay | Admitting: Hematology

## 2014-08-02 NOTE — Telephone Encounter (Signed)
Lvm advisng appt time chg on 11/29/14 from 1 pm to 2 pm.

## 2014-09-12 ENCOUNTER — Encounter (HOSPITAL_COMMUNITY): Payer: Self-pay | Admitting: Emergency Medicine

## 2014-09-12 ENCOUNTER — Emergency Department (HOSPITAL_COMMUNITY): Payer: 59

## 2014-09-12 ENCOUNTER — Emergency Department (HOSPITAL_COMMUNITY)
Admission: EM | Admit: 2014-09-12 | Discharge: 2014-09-12 | Disposition: A | Discharge status: Home / Self Care | Payer: 59 | Attending: Emergency Medicine | Admitting: Emergency Medicine

## 2014-09-12 ENCOUNTER — Other Ambulatory Visit: Payer: Self-pay | Admitting: Family Medicine

## 2014-09-12 DIAGNOSIS — R1013 Epigastric pain: Secondary | ICD-10-CM | POA: Diagnosis present

## 2014-09-12 DIAGNOSIS — M797 Fibromyalgia: Secondary | ICD-10-CM | POA: Diagnosis not present

## 2014-09-12 DIAGNOSIS — N390 Urinary tract infection, site not specified: Secondary | ICD-10-CM | POA: Insufficient documentation

## 2014-09-12 DIAGNOSIS — Z9089 Acquired absence of other organs: Secondary | ICD-10-CM | POA: Insufficient documentation

## 2014-09-12 DIAGNOSIS — K8050 Calculus of bile duct without cholangitis or cholecystitis without obstruction: Secondary | ICD-10-CM

## 2014-09-12 DIAGNOSIS — Z720 Tobacco use: Secondary | ICD-10-CM | POA: Diagnosis not present

## 2014-09-12 DIAGNOSIS — R1011 Right upper quadrant pain: Secondary | ICD-10-CM | POA: Diagnosis not present

## 2014-09-12 DIAGNOSIS — R112 Nausea with vomiting, unspecified: Secondary | ICD-10-CM | POA: Insufficient documentation

## 2014-09-12 DIAGNOSIS — K8020 Calculus of gallbladder without cholecystitis without obstruction: Secondary | ICD-10-CM | POA: Diagnosis not present

## 2014-09-12 DIAGNOSIS — G43909 Migraine, unspecified, not intractable, without status migrainosus: Secondary | ICD-10-CM | POA: Insufficient documentation

## 2014-09-12 DIAGNOSIS — R6883 Chills (without fever): Secondary | ICD-10-CM | POA: Insufficient documentation

## 2014-09-12 LAB — CBC WITH DIFFERENTIAL/PLATELET
Basophils Absolute: 0 10*3/uL (ref 0.0–0.1)
Basophils Relative: 0 % (ref 0–1)
EOS PCT: 1 % (ref 0–5)
Eosinophils Absolute: 0.1 10*3/uL (ref 0.0–0.7)
HEMATOCRIT: 51.1 % — AB (ref 36.0–46.0)
HEMOGLOBIN: 17.7 g/dL — AB (ref 12.0–15.0)
LYMPHS ABS: 1.7 10*3/uL (ref 0.7–4.0)
LYMPHS PCT: 19 % (ref 12–46)
MCH: 34.4 pg — ABNORMAL HIGH (ref 26.0–34.0)
MCHC: 34.6 g/dL (ref 30.0–36.0)
MCV: 99.2 fL (ref 78.0–100.0)
MONO ABS: 0.7 10*3/uL (ref 0.1–1.0)
MONOS PCT: 7 % (ref 3–12)
Neutro Abs: 6.6 10*3/uL (ref 1.7–7.7)
Neutrophils Relative %: 73 % (ref 43–77)
PLATELETS: 205 10*3/uL (ref 150–400)
RBC: 5.15 MIL/uL — ABNORMAL HIGH (ref 3.87–5.11)
RDW: 17.8 % — ABNORMAL HIGH (ref 11.5–15.5)
WBC: 9.1 10*3/uL (ref 4.0–10.5)

## 2014-09-12 LAB — COMPREHENSIVE METABOLIC PANEL
ALT: 12 U/L (ref 0–35)
AST: 21 U/L (ref 0–37)
Albumin: 3.9 g/dL (ref 3.5–5.2)
Alkaline Phosphatase: 104 U/L (ref 39–117)
Anion gap: 16 — ABNORMAL HIGH (ref 5–15)
BUN: 13 mg/dL (ref 6–23)
CO2: 23 meq/L (ref 19–32)
CREATININE: 0.65 mg/dL (ref 0.50–1.10)
Calcium: 10.3 mg/dL (ref 8.4–10.5)
Chloride: 98 mEq/L (ref 96–112)
GLUCOSE: 86 mg/dL (ref 70–99)
Potassium: 4.4 mEq/L (ref 3.7–5.3)
Sodium: 137 mEq/L (ref 137–147)
Total Bilirubin: 0.7 mg/dL (ref 0.3–1.2)
Total Protein: 7.8 g/dL (ref 6.0–8.3)

## 2014-09-12 LAB — URINALYSIS, ROUTINE W REFLEX MICROSCOPIC
Glucose, UA: NEGATIVE mg/dL
Ketones, ur: >80 mg/dL — AB
NITRITE: POSITIVE — AB
PROTEIN: NEGATIVE mg/dL
Specific Gravity, Urine: 1.022 (ref 1.005–1.030)
Urobilinogen, UA: 1 mg/dL (ref 0.0–1.0)
pH: 5 (ref 5.0–8.0)

## 2014-09-12 LAB — URINE MICROSCOPIC-ADD ON

## 2014-09-12 LAB — LIPASE, BLOOD: LIPASE: 22 U/L (ref 11–59)

## 2014-09-12 MED ORDER — HYDROMORPHONE HCL 1 MG/ML IJ SOLN
0.5000 mg | INTRAMUSCULAR | Status: DC | PRN
  Administered 2014-09-12: 0.5 mg via INTRAVENOUS
  Filled 2014-09-12: qty 1

## 2014-09-12 MED ORDER — SODIUM CHLORIDE 0.9 % IV BOLUS (SEPSIS)
1000.0000 mL | Freq: Once | INTRAVENOUS | Status: AC
  Administered 2014-09-12: 1000 mL via INTRAVENOUS

## 2014-09-12 MED ORDER — ONDANSETRON HCL 4 MG PO TABS: 4.0000 mg | ORAL_TABLET | Freq: Three times a day (TID) | ORAL | Status: DC | PRN

## 2014-09-12 MED ORDER — CEPHALEXIN 500 MG PO CAPS: 500.0000 mg | ORAL_CAPSULE | Freq: Two times a day (BID) | ORAL | Status: DC

## 2014-09-12 MED ORDER — HYDROCODONE-ACETAMINOPHEN 5-325 MG PO TABS: 1.0000 | ORAL_TABLET | Freq: Four times a day (QID) | ORAL | Status: DC | PRN

## 2014-09-12 NOTE — ED Provider Notes (Signed)
CSN: [id number]     Arrival date & time 09/12/14  1550 History   First MD Initiated Contact with Patient 09/12/14 1644     Chief Complaint  Patient presents with  . Sent from PCP for CT scan     (Consider location/radiation/quality/duration/timing/severity/associated sxs/prior Treatment) Patient is a 53 y.o. female presenting with abdominal pain.  Abdominal Pain Pain location:  Epigastric and RUQ Pain quality: gnawing   Pain radiates to:  Does not radiate Pain severity:  Severe Onset quality:  Gradual Duration:  4 days Timing:  Constant Progression:  Worsening Chronicity:  New Context comment:  Saw her primary doctor today, was sent to the emergency department for further workup. Relieved by: Improved slightly after IM Toradol given PCPs office. Worsened by:  Nothing tried Associated symptoms: chills, nausea and vomiting   Associated symptoms: no chest pain, no constipation, no diarrhea, no dysuria and no fever     Past Medical History  Diagnosis Date  . Migraines   . Fibromyalgia    Past Surgical History  Procedure Laterality Date  . Cesarean section    . Appendectomy     No family history on file. History  Substance Use Topics  . Smoking status: Current Every Day Smoker -- 0.50 packs/day for 20 years  . Smokeless tobacco: Not on file  . Alcohol Use: 0.6 oz/week    1 Glasses of wine per week   OB History   Grav Para Term Preterm Abortions TAB SAB Ect Mult Living                 Review of Systems  Constitutional: Positive for chills. Negative for fever.  Cardiovascular: Negative for chest pain.  Gastrointestinal: Positive for nausea, vomiting and abdominal pain. Negative for diarrhea and constipation.  Genitourinary: Negative for dysuria.  All other systems reviewed and are negative.     Allergies  Citrus and Lisinopril  Home Medications   Prior to Admission medications   Medication Sig Start Date End Date Taking? Authorizing Provider  amLODipine  (NORVASC) 5 MG tablet Take 5 mg by mouth daily. 10/29/13 10/29/14 Yes Historical Provider, MD  DULoxetine (CYMBALTA) 60 MG capsule Take 60 mg by mouth daily.   Yes Historical Provider, MD  metaxalone (SKELAXIN) 800 MG tablet Take 800 mg by mouth 4 (four) times daily.   Yes Historical Provider, MD  pregabalin (LYRICA) 50 MG capsule Take 50 mg by mouth 4 (four) times daily as needed (fibromyalgia).    Yes Historical Provider, MD   BP 146/98  Pulse 92  Temp(Src) 97.7 F (36.5 C) (Oral)  Resp 16  SpO2 90%  LMP 08/19/2012 Physical Exam  Nursing note and vitals reviewed. Constitutional: She is oriented to person, place, and time. She appears well-developed and well-nourished. No distress.  HENT:  Head: Normocephalic and atraumatic.  Mouth/Throat: Oropharynx is clear and moist.  Eyes: Conjunctivae are normal. Pupils are equal, round, and reactive to light. No scleral icterus.  Neck: Neck supple.  Cardiovascular: Normal rate, regular rhythm, normal heart sounds and intact distal pulses.   No murmur heard. Pulmonary/Chest: Effort normal and breath sounds normal. No stridor. No respiratory distress. She has no rales.  Abdominal: Soft. Bowel sounds are normal. She exhibits no distension. There is tenderness in the right upper quadrant and epigastric area. There is no rigidity, no rebound, no guarding and no CVA tenderness.  Musculoskeletal: Normal range of motion.  Neurological: She is alert and oriented to person, place, and time.  Skin: Skin  is warm and dry. No rash noted.  Psychiatric: She has a normal mood and affect. Her behavior is normal.    ED Course  Procedures (including critical care time) Labs Review Labs Reviewed  CBC WITH DIFFERENTIAL - Abnormal; Notable for the following:    RBC 5.15 (*)    Hemoglobin 17.7 (*)    HCT 51.1 (*)    MCH 34.4 (*)    RDW 17.8 (*)    All other components within normal limits  COMPREHENSIVE METABOLIC PANEL - Abnormal; Notable for the following:     Anion gap 16 (*)    All other components within normal limits  URINALYSIS, ROUTINE W REFLEX MICROSCOPIC - Abnormal; Notable for the following:    Color, Urine AMBER (*)    APPearance CLOUDY (*)    Hgb urine dipstick MODERATE (*)    Bilirubin Urine SMALL (*)    Ketones, ur >80 (*)    Nitrite POSITIVE (*)    Leukocytes, UA LARGE (*)    All other components within normal limits  URINE MICROSCOPIC-ADD ON - Abnormal; Notable for the following:    Squamous Epithelial / LPF MANY (*)    Bacteria, UA MANY (*)    All other components within normal limits  LIPASE, BLOOD    Imaging Review US Abdomen Limited  09/12/2014   CLINICAL DATA:  Four day history of right upper quadrant pain  EXAM: US ABDOMEN LIMITED - RIGHT UPPER QUADRANT  COMPARISON:  None.  FINDINGS: Gallbladder:  There is minimal sludge in the gallbladder. No gallstones are appreciable. There is no gallbladder wall thickening or pericholecystic fluid. No sonographic Murphy sign noted.  Common bile duct:  Diameter: 5 mm. No intrahepatic or extrahepatic biliary duct dilatation.  Liver:  No focal lesion identified. Liver echogenicity is overall increased. There may be mild fatty sparing near the gallbladder fossa.  IMPRESSION: Minimal sludge in gallbladder. Gallbladder otherwise appears unremarkable.  Increased echogenicity in the liver is consistent with hepatic steatosis. While no focal liver lesions are identified, it must be cautioned that the sensitivity of ultrasound for focal liver lesions is diminished in this circumstance.   Electronically Signed   By: Lowella Grip M.D.   On: 09/12/2014 19:32     EKG Interpretation None      MDM   Final diagnoses:  RUQ abdominal pain  Biliary colic  UTI (lower urinary tract infection)    53 year old female sent from her PCPs office for evaluation of abdominal pain.  Pain is epigastric and RUQ.  Labs demonstrated normal lipase, normal white blood cell count, normal kidney function,  normal LFTs.  She does have evidence of a urinary tract infection. Her pain was controlled and did not recur. She did have some hypoxia after receiving Dilaudid. This improved and she remained well-appearing subsequently. Her ultrasound showed gallbladder sludge without signs of acute cholecystitis. She tolerated by mouth intake without recurrence of pain or vomiting. I suspect pain is from biliary colic and have referred her to general surgery for further evaluation. We'll also treat her for urinary tract infection.   Artis Delay, MD 09/12/14 2302

## 2014-09-12 NOTE — Discharge Instructions (Signed)
Abdominal Pain, Women °Abdominal (stomach, pelvic, or belly) pain can be caused by many things. It is important to tell your doctor: °· The location of the pain. °· Does it come and go or is it present all the time? °· Are there things that start the pain (eating certain foods, exercise)? °· Are there other symptoms associated with the pain (fever, nausea, vomiting, diarrhea)? °All of this is helpful to know when trying to find the cause of the pain. °CAUSES  °· Stomach: virus or bacteria infection, or ulcer. °· Intestine: appendicitis (inflamed appendix), regional ileitis (Crohn's disease), ulcerative colitis (inflamed colon), irritable bowel syndrome, diverticulitis (inflamed diverticulum of the colon), or cancer of the stomach or intestine. °· Gallbladder disease or stones in the gallbladder. °· Kidney disease, kidney stones, or infection. °· Pancreas infection or cancer. °· Fibromyalgia (pain disorder). °· Diseases of the female organs: °¨ Uterus: fibroid (non-cancerous) tumors or infection. °¨ Fallopian tubes: infection or tubal pregnancy. °¨ Ovary: cysts or tumors. °¨ Pelvic adhesions (scar tissue). °¨ Endometriosis (uterus lining tissue growing in the pelvis and on the pelvic organs). °¨ Pelvic congestion syndrome (female organs filling up with blood just before the menstrual period). °¨ Pain with the menstrual period. °¨ Pain with ovulation (producing an egg). °¨ Pain with an IUD (intrauterine device, birth control) in the uterus. °¨ Cancer of the female organs. °· Functional pain (pain not caused by a disease, may improve without treatment). °· Psychological pain. °· Depression. °DIAGNOSIS  °Your doctor will decide the seriousness of your pain by doing an examination. °· Blood tests. °· X-rays. °· Ultrasound. °· CT scan (computed tomography, special type of X-ray). °· MRI (magnetic resonance imaging). °· Cultures, for infection. °· Barium enema (dye inserted in the large intestine, to better view it with  X-rays). °· Colonoscopy (looking in intestine with a lighted tube). °· Laparoscopy (minor surgery, looking in abdomen with a lighted tube). °· Major abdominal exploratory surgery (looking in abdomen with a large incision). °TREATMENT  °The treatment will depend on the cause of the pain.  °· Many cases can be observed and treated at home. °· Over-the-counter medicines recommended by your caregiver. °· Prescription medicine. °· Antibiotics, for infection. °· Birth control pills, for painful periods or for ovulation pain. °· Hormone treatment, for endometriosis. °· Nerve blocking injections. °· Physical therapy. °· Antidepressants. °· Counseling with a psychologist or psychiatrist. °· Minor or major surgery. °HOME CARE INSTRUCTIONS  °· Do not take laxatives, unless directed by your caregiver. °· Take over-the-counter pain medicine only if ordered by your caregiver. Do not take aspirin because it can cause an upset stomach or bleeding. °· Try a clear liquid diet (broth or water) as ordered by your caregiver. Slowly move to a bland diet, as tolerated, if the pain is related to the stomach or intestine. °· Have a thermometer and take your temperature several times a day, and record it. °· Bed rest and sleep, if it helps the pain. °· Avoid sexual intercourse, if it causes pain. °· Avoid stressful situations. °· Keep your follow-up appointments and tests, as your caregiver orders. °· If the pain does not go away with medicine or surgery, you may try: °¨ Acupuncture. °¨ Relaxation exercises (yoga, meditation). °¨ Group therapy. °¨ Counseling. °SEEK MEDICAL CARE IF:  °· You notice certain foods cause stomach pain. °· Your home care treatment is not helping your pain. °· You need stronger pain medicine. °· You want your IUD removed. °· You feel faint or   lightheaded.  You develop nausea and vomiting.  You develop a rash.  You are having side effects or an allergy to your medicine. SEEK IMMEDIATE MEDICAL CARE IF:   Your  pain does not go away or gets worse.  You have a fever.  Your pain is felt only in portions of the abdomen. The right side could possibly be appendicitis. The left lower portion of the abdomen could be colitis or diverticulitis.  You are passing blood in your stools (bright red or black tarry stools, with or without vomiting).  You have blood in your urine.  You develop chills, with or without a fever.  You pass out. MAKE SURE YOU:   Understand these instructions.  Will watch your condition.  Will get help right away if you are not doing well or get worse. Document Released: 08/29/2007 Document Revised: 03/18/2014 Document Reviewed: 09/18/2009 [facility] Patient Information 2015 [place], [place]. This information is not intended to replace advice given to you by your health care provider. Make sure you discuss any questions you have with your health care provider.  Biliary Colic  Biliary colic is a steady or irregular pain in the upper abdomen. It is usually under the right side of the rib cage. It happens when gallstones interfere with the normal flow of bile from the gallbladder. Bile is a liquid that helps to digest fats. Bile is made in the liver and stored in the gallbladder. When you eat a meal, bile passes from the gallbladder through the cystic duct and the common bile duct into the small intestine. There, it mixes with partially digested food. If a gallstone blocks either of these ducts, the normal flow of bile is blocked. The muscle cells in the bile duct contract forcefully to try to move the stone. This causes the pain of biliary colic.  SYMPTOMS   A person with biliary colic usually complains of pain in the upper abdomen. This pain can be:  In the center of the upper abdomen just below the breastbone.  In the upper-right part of the abdomen, near the gallbladder and liver.  Spread back toward the right shoulder blade.  Nausea and vomiting.  The pain usually occurs after  eating.  Biliary colic is usually triggered by the digestive system's demand for bile. The demand for bile is high after fatty meals. Symptoms can also occur when a person who has been fasting suddenly eats a very large meal. Most episodes of biliary colic pass after 1 to 5 hours. After the most intense pain passes, your abdomen may continue to ache mildly for about 24 hours. DIAGNOSIS  After you describe your symptoms, your caregiver will perform a physical exam. He or she will pay attention to the upper right portion of your belly (abdomen). This is the area of your liver and gallbladder. An ultrasound will help your caregiver look for gallstones. Specialized scans of the gallbladder may also be done. Blood tests may be done, especially if you have fever or if your pain persists. PREVENTION  Biliary colic can be prevented by controlling the risk factors for gallstones. Some of these risk factors, such as heredity, increasing age, and pregnancy are a normal part of life. Obesity and a high-fat diet are risk factors you can change through a healthy lifestyle. Women going through menopause who take hormone replacement therapy (estrogen) are also more likely to develop biliary colic. TREATMENT   Pain medication may be prescribed.  You may be encouraged to eat a fat-free  diet.  If the first episode of biliary colic is severe, or episodes of colic keep retuning, surgery to remove the gallbladder (cholecystectomy) is usually recommended. This procedure can be done through small incisions using an instrument called a laparoscope. The procedure often requires a brief stay in the hospital. Some people can leave the hospital the same day. It is the most widely used treatment in people troubled by painful gallstones. It is effective and safe, with no complications in more than 90% of cases.  If surgery cannot be done, medication that dissolves gallstones may be used. This medication is expensive and can take  months or years to work. Only small stones will dissolve.  Rarely, medication to dissolve gallstones is combined with a procedure called shock-wave lithotripsy. This procedure uses carefully aimed shock waves to break up gallstones. In many people treated with this procedure, gallstones form again within a few years. PROGNOSIS  If gallstones block your cystic duct or common bile duct, you are at risk for repeated episodes of biliary colic. There is also a 25% chance that you will develop a gallbladder infection(acute cholecystitis), or some other complication of gallstones within 10 to 20 years. If you have surgery, schedule it at a time that is convenient for you and at a time when you are not sick. HOME CARE INSTRUCTIONS   Drink plenty of clear fluids.  Avoid fatty, greasy or fried foods, or any foods that make your pain worse.  Take medications as directed. SEEK MEDICAL CARE IF:   You develop a fever over 100.5 F (38.1 C).  Your pain gets worse over time.  You develop nausea that prevents you from eating and drinking.  You develop vomiting. SEEK IMMEDIATE MEDICAL CARE IF:   You have continuous or severe belly (abdominal) pain which is not relieved with medications.  You develop nausea and vomiting which is not relieved with medications.  You have symptoms of biliary colic and you suddenly develop a fever and shaking chills. This may signal cholecystitis. Call your caregiver immediately.  You develop a yellow color to your skin or the white part of your eyes (jaundice). Document Released: 04/04/2006 Document Revised: 01/24/2012 Document Reviewed: 06/13/2008 [facility] Patient Information 2015 [place], [place]. This information is not intended to replace advice given to you by your health care provider. Make sure you discuss any questions you have with your health care provider.  Urinary Tract Infection Urinary tract infections (UTIs) can develop anywhere along your urinary tract.  Your urinary tract is your body's drainage system for removing wastes and extra water. Your urinary tract includes two kidneys, two ureters, a bladder, and a urethra. Your kidneys are a pair of bean-shaped organs. Each kidney is about the size of your fist. They are located below your ribs, one on each side of your spine. CAUSES Infections are caused by microbes, which are microscopic organisms, including fungi, viruses, and bacteria. These organisms are so small that they can only be seen through a microscope. Bacteria are the microbes that most commonly cause UTIs. SYMPTOMS  Symptoms of UTIs may vary by age and gender of the patient and by the location of the infection. Symptoms in [place] women typically include a frequent and intense urge to urinate and a painful, burning feeling in the bladder or urethra during urination. Older women and men are more likely to be tired, shaky, and weak and have muscle aches and abdominal pain. A fever may mean the infection is in your kidneys. Other symptoms  of a kidney infection include pain in your back or sides below the ribs, nausea, and vomiting. DIAGNOSIS To diagnose a UTI, your caregiver will ask you about your symptoms. Your caregiver also will ask to provide a urine sample. The urine sample will be tested for bacteria and white blood cells. White blood cells are made by your body to help fight infection. TREATMENT  Typically, UTIs can be treated with medication. Because most UTIs are caused by a bacterial infection, they usually can be treated with the use of antibiotics. The choice of antibiotic and length of treatment depend on your symptoms and the type of bacteria causing your infection. HOME CARE INSTRUCTIONS  If you were prescribed antibiotics, take them exactly as your caregiver instructs you. Finish the medication even if you feel better after you have only taken some of the medication.  Drink enough water and fluids to keep your urine clear or pale  yellow.  Avoid caffeine, tea, and carbonated beverages. They tend to irritate your bladder.  Empty your bladder often. Avoid holding urine for long periods of time.  Empty your bladder before and after sexual intercourse.  After a bowel movement, women should cleanse from front to back. Use each tissue only once. SEEK MEDICAL CARE IF:   You have back pain.  You develop a fever.  Your symptoms do not begin to resolve within 3 days. SEEK IMMEDIATE MEDICAL CARE IF:   You have severe back pain or lower abdominal pain.  You develop chills.  You have nausea or vomiting.  You have continued burning or discomfort with urination. MAKE SURE YOU:   Understand these instructions.  Will watch your condition.  Will get help right away if you are not doing well or get worse. Document Released: 08/11/2005 Document Revised: 05/02/2012 Document Reviewed: 12/10/2011 [facility] Patient Information 2015 [place], [place]. This information is not intended to replace advice given to you by your health care provider. Make sure you discuss any questions you have with your health care provider.

## 2014-09-12 NOTE — ED Notes (Signed)
Pt sent from [facility] for vomiting and upper abd pain x 4 days for CT scan of abd and pelvis.  They are believing possible dx to be pancreatitis, kidney stones, SBO.

## 2014-09-12 NOTE — ED Notes (Signed)
Pt aware urine sample is needed, urine sample is needed.

## 2014-11-07 ENCOUNTER — Telehealth: Payer: Self-pay | Admitting: Internal Medicine

## 2014-11-29 ENCOUNTER — Ambulatory Visit: Payer: 59

## 2014-11-29 ENCOUNTER — Other Ambulatory Visit: Payer: 59

## 2014-12-05 ENCOUNTER — Other Ambulatory Visit: Payer: Self-pay | Admitting: Medical Oncology

## 2014-12-05 DIAGNOSIS — D472 Monoclonal gammopathy: Secondary | ICD-10-CM

## 2014-12-09 ENCOUNTER — Other Ambulatory Visit: Payer: 59

## 2014-12-09 ENCOUNTER — Ambulatory Visit: Payer: 59 | Admitting: Internal Medicine

## 2015-02-06 ENCOUNTER — Other Ambulatory Visit (HOSPITAL_COMMUNITY)
Admission: RE | Admit: 2015-02-06 | Discharge: 2015-02-06 | Disposition: A | Discharge status: Home / Self Care | Payer: 59 | Source: Ambulatory Visit | Attending: Family Medicine | Admitting: Family Medicine

## 2015-02-06 ENCOUNTER — Other Ambulatory Visit: Payer: Self-pay | Admitting: Family Medicine

## 2015-02-06 DIAGNOSIS — Z124 Encounter for screening for malignant neoplasm of cervix: Secondary | ICD-10-CM | POA: Insufficient documentation

## 2015-02-10 ENCOUNTER — Other Ambulatory Visit: Payer: Self-pay | Admitting: Family Medicine

## 2015-02-10 DIAGNOSIS — Z1231 Encounter for screening mammogram for malignant neoplasm of breast: Secondary | ICD-10-CM

## 2015-02-12 LAB — CYTOLOGY - PAP

## 2015-02-25 ENCOUNTER — Other Ambulatory Visit: Payer: Self-pay | Admitting: Family Medicine

## 2015-02-25 DIAGNOSIS — Z803 Family history of malignant neoplasm of breast: Secondary | ICD-10-CM

## 2015-02-25 DIAGNOSIS — Z1231 Encounter for screening mammogram for malignant neoplasm of breast: Secondary | ICD-10-CM

## 2015-03-13 ENCOUNTER — Ambulatory Visit
Admission: RE | Admit: 2015-03-13 | Discharge: 2015-03-13 | Disposition: A | Discharge status: Home / Self Care | Payer: 59 | Source: Ambulatory Visit | Attending: Family Medicine | Admitting: Family Medicine

## 2015-03-13 DIAGNOSIS — Z803 Family history of malignant neoplasm of breast: Secondary | ICD-10-CM

## 2015-03-13 DIAGNOSIS — Z1231 Encounter for screening mammogram for malignant neoplasm of breast: Secondary | ICD-10-CM

## 2015-09-30 ENCOUNTER — Encounter: Payer: Self-pay | Admitting: Pulmonary Disease

## 2016-11-12 ENCOUNTER — Other Ambulatory Visit: Payer: Self-pay | Admitting: Rheumatology

## 2016-11-12 ENCOUNTER — Telehealth: Payer: Self-pay | Admitting: Rheumatology

## 2016-11-12 NOTE — Telephone Encounter (Signed)
-----   Message from Carole Binning, LPN sent at [id number] 10:50 AM EST ----- Regarding: Please schedule patient for a follow up appointment.  Please schedule patient for a follow up appointment. Patient is due February 2018. Thanks!

## 2016-11-12 NOTE — Telephone Encounter (Signed)
Last Visit: 06/22/16 Next Visit due February 2018. Message sent to the front to schedule.   Okay to refill Cymbalta?

## 2016-11-12 NOTE — Telephone Encounter (Signed)
LMOM for patient to call back to schedule appointment.  

## 2016-12-09 ENCOUNTER — Other Ambulatory Visit: Payer: Self-pay | Admitting: Rheumatology

## 2016-12-09 NOTE — Telephone Encounter (Signed)
Last Visit: 06/22/16 Next visit: 01/03/17  Okay to refill Cymbalta?

## 2017-01-03 ENCOUNTER — Ambulatory Visit: Payer: Self-pay | Admitting: Rheumatology

## 2017-01-05 ENCOUNTER — Other Ambulatory Visit: Payer: Self-pay | Admitting: Rheumatology

## 2017-01-05 NOTE — Telephone Encounter (Signed)
Last Visit: 06/22/16 Next Visit: 01/31/17  Okay to refill Cymbalta?

## 2017-01-26 ENCOUNTER — Inpatient Hospital Stay (HOSPITAL_COMMUNITY): Payer: Commercial Managed Care - HMO

## 2017-01-26 ENCOUNTER — Inpatient Hospital Stay (HOSPITAL_COMMUNITY)
Admission: EM | Admit: 2017-01-26 | Discharge: 2017-01-28 | DRG: 280 | Disposition: A | Discharge status: Home / Self Care | Payer: Commercial Managed Care - HMO | Attending: Infectious Disease | Admitting: Infectious Disease

## 2017-01-26 ENCOUNTER — Emergency Department (HOSPITAL_COMMUNITY): Payer: Commercial Managed Care - HMO

## 2017-01-26 ENCOUNTER — Encounter (HOSPITAL_COMMUNITY): Payer: Self-pay | Admitting: Emergency Medicine

## 2017-01-26 DIAGNOSIS — R0602 Shortness of breath: Secondary | ICD-10-CM | POA: Diagnosis not present

## 2017-01-26 DIAGNOSIS — Z9989 Dependence on other enabling machines and devices: Secondary | ICD-10-CM | POA: Diagnosis not present

## 2017-01-26 DIAGNOSIS — I5032 Chronic diastolic (congestive) heart failure: Secondary | ICD-10-CM | POA: Diagnosis present

## 2017-01-26 DIAGNOSIS — F17210 Nicotine dependence, cigarettes, uncomplicated: Secondary | ICD-10-CM | POA: Diagnosis not present

## 2017-01-26 DIAGNOSIS — R51 Headache: Secondary | ICD-10-CM

## 2017-01-26 DIAGNOSIS — J9611 Chronic respiratory failure with hypoxia: Secondary | ICD-10-CM

## 2017-01-26 DIAGNOSIS — I21A1 Myocardial infarction type 2: Secondary | ICD-10-CM | POA: Diagnosis present

## 2017-01-26 DIAGNOSIS — I272 Pulmonary hypertension, unspecified: Secondary | ICD-10-CM | POA: Diagnosis present

## 2017-01-26 DIAGNOSIS — G43909 Migraine, unspecified, not intractable, without status migrainosus: Secondary | ICD-10-CM

## 2017-01-26 DIAGNOSIS — D472 Monoclonal gammopathy: Secondary | ICD-10-CM | POA: Diagnosis present

## 2017-01-26 DIAGNOSIS — I10 Essential (primary) hypertension: Secondary | ICD-10-CM | POA: Diagnosis not present

## 2017-01-26 DIAGNOSIS — Z91018 Allergy to other foods: Secondary | ICD-10-CM

## 2017-01-26 DIAGNOSIS — J9601 Acute respiratory failure with hypoxia: Secondary | ICD-10-CM | POA: Diagnosis present

## 2017-01-26 DIAGNOSIS — E669 Obesity, unspecified: Secondary | ICD-10-CM | POA: Diagnosis present

## 2017-01-26 DIAGNOSIS — J441 Chronic obstructive pulmonary disease with (acute) exacerbation: Secondary | ICD-10-CM | POA: Diagnosis present

## 2017-01-26 DIAGNOSIS — Z888 Allergy status to other drugs, medicaments and biological substances status: Secondary | ICD-10-CM | POA: Diagnosis not present

## 2017-01-26 DIAGNOSIS — I214 Non-ST elevation (NSTEMI) myocardial infarction: Secondary | ICD-10-CM | POA: Diagnosis not present

## 2017-01-26 DIAGNOSIS — Z9981 Dependence on supplemental oxygen: Secondary | ICD-10-CM

## 2017-01-26 DIAGNOSIS — I110 Hypertensive heart disease with heart failure: Secondary | ICD-10-CM | POA: Diagnosis present

## 2017-01-26 DIAGNOSIS — I5030 Unspecified diastolic (congestive) heart failure: Secondary | ICD-10-CM

## 2017-01-26 DIAGNOSIS — E785 Hyperlipidemia, unspecified: Secondary | ICD-10-CM | POA: Diagnosis present

## 2017-01-26 DIAGNOSIS — G4733 Obstructive sleep apnea (adult) (pediatric): Secondary | ICD-10-CM | POA: Diagnosis not present

## 2017-01-26 DIAGNOSIS — Z79899 Other long term (current) drug therapy: Secondary | ICD-10-CM | POA: Diagnosis not present

## 2017-01-26 DIAGNOSIS — R519 Headache, unspecified: Secondary | ICD-10-CM

## 2017-01-26 DIAGNOSIS — Z862 Personal history of diseases of the blood and blood-forming organs and certain disorders involving the immune mechanism: Secondary | ICD-10-CM | POA: Diagnosis not present

## 2017-01-26 DIAGNOSIS — J9811 Atelectasis: Secondary | ICD-10-CM | POA: Diagnosis present

## 2017-01-26 DIAGNOSIS — R0902 Hypoxemia: Secondary | ICD-10-CM

## 2017-01-26 DIAGNOSIS — Z8249 Family history of ischemic heart disease and other diseases of the circulatory system: Secondary | ICD-10-CM | POA: Diagnosis not present

## 2017-01-26 DIAGNOSIS — C9000 Multiple myeloma not having achieved remission: Secondary | ICD-10-CM | POA: Diagnosis present

## 2017-01-26 DIAGNOSIS — J9691 Respiratory failure, unspecified with hypoxia: Secondary | ICD-10-CM

## 2017-01-26 DIAGNOSIS — M797 Fibromyalgia: Secondary | ICD-10-CM | POA: Diagnosis not present

## 2017-01-26 HISTORY — DX: Hyperlipidemia, unspecified: E78.5

## 2017-01-26 HISTORY — DX: Myocardial infarction type 2: I21.A1

## 2017-01-26 HISTORY — DX: Sleep apnea, unspecified: G47.30

## 2017-01-26 HISTORY — DX: Depression, unspecified: F32.A

## 2017-01-26 HISTORY — DX: Major depressive disorder, single episode, unspecified: F32.9

## 2017-01-26 HISTORY — DX: Essential (primary) hypertension: I10

## 2017-01-26 LAB — BASIC METABOLIC PANEL
Anion gap: 7 (ref 5–15)
BUN: 9 mg/dL (ref 6–20)
CO2: 24 mmol/L (ref 22–32)
Calcium: 9.5 mg/dL (ref 8.9–10.3)
Chloride: 109 mmol/L (ref 101–111)
Creatinine, Ser: 0.79 mg/dL (ref 0.44–1.00)
GFR calc Af Amer: >60 mL/min (ref >60)
GFR calc non Af Amer: >60 mL/min (ref >60)
Glucose, Bld: 100 mg/dL — ABNORMAL HIGH (ref 65–99)
Potassium: 4.8 mmol/L (ref 3.5–5.1)
Sodium: 140 mmol/L (ref 135–145)

## 2017-01-26 LAB — CBC WITH DIFFERENTIAL/PLATELET
Basophils Absolute: 0 10*3/uL (ref 0.0–0.1)
Basophils Relative: 0 %
EOS ABS: 0.1 10*3/uL (ref 0.0–0.7)
Eosinophils Relative: 1 %
HCT: 40.7 % (ref 36.0–46.0)
Hemoglobin: 12.7 g/dL (ref 12.0–15.0)
LYMPHS ABS: 1 10*3/uL (ref 0.7–4.0)
Lymphocytes Relative: 15 %
MCH: 24 pg — AB (ref 26.0–34.0)
MCHC: 31.2 g/dL (ref 30.0–36.0)
MCV: 76.8 fL — ABNORMAL LOW (ref 78.0–100.0)
Monocytes Absolute: 0.6 10*3/uL (ref 0.1–1.0)
Monocytes Relative: 8 %
Neutro Abs: 5.3 10*3/uL (ref 1.7–7.7)
Neutrophils Relative %: 76 %
PLATELETS: ADEQUATE 10*3/uL (ref 150–400)
RBC: 5.3 MIL/uL — ABNORMAL HIGH (ref 3.87–5.11)
RDW: 20.3 % — ABNORMAL HIGH (ref 11.5–15.5)
WBC: 7 10*3/uL (ref 4.0–10.5)

## 2017-01-26 LAB — I-STAT ARTERIAL BLOOD GAS, ED
Bicarbonate: 25.9 mmol/L (ref 20.0–28.0)
O2 Saturation: 90 %
Patient temperature: 98.6
TCO2: 27 mmol/L (ref 0–100)
pCO2 arterial: 45.4 mmHg (ref 32.0–48.0)
pH, Arterial: 7.363 (ref 7.350–7.450)
pO2, Arterial: 62 mmHg — ABNORMAL LOW (ref 83.0–108.0)

## 2017-01-26 LAB — BRAIN NATRIURETIC PEPTIDE: B Natriuretic Peptide: 224.3 pg/mL — ABNORMAL HIGH (ref 0.0–100.0)

## 2017-01-26 LAB — ECHOCARDIOGRAM COMPLETE
Height: 66 in
Weight: 3056 oz

## 2017-01-26 LAB — PROTIME-INR
INR: 1.11
Prothrombin Time: 14.4 seconds (ref 11.4–15.2)

## 2017-01-26 LAB — TROPONIN I
Troponin I: 0.12 ng/mL (ref <0.03)
Troponin I: 0.13 ng/mL (ref <0.03)

## 2017-01-26 LAB — I-STAT TROPONIN, ED: Troponin i, poc: 0.09 ng/mL (ref 0.00–0.08)

## 2017-01-26 LAB — HEPARIN LEVEL (UNFRACTIONATED): HEPARIN UNFRACTIONATED: 0.2 [IU]/mL — AB (ref 0.30–0.70)

## 2017-01-26 MED ORDER — MIRTAZAPINE 7.5 MG PO TABS
15.0000 mg | ORAL_TABLET | Freq: Every day | ORAL | Status: DC
  Administered 2017-01-26 – 2017-01-27 (×2): 15 mg via ORAL
  Filled 2017-01-26 (×2): qty 2

## 2017-01-26 MED ORDER — AMLODIPINE BESYLATE 5 MG PO TABS: 5.0000 mg | ORAL_TABLET | Freq: Every day | ORAL | Status: DC

## 2017-01-26 MED ORDER — ENOXAPARIN SODIUM 40 MG/0.4ML ~~LOC~~ SOLN: 40.0000 mg | SUBCUTANEOUS | Status: DC

## 2017-01-26 MED ORDER — FUROSEMIDE 10 MG/ML IJ SOLN
40.0000 mg | Freq: Once | INTRAMUSCULAR | Status: AC
  Administered 2017-01-26: 40 mg via INTRAVENOUS
  Filled 2017-01-26: qty 4

## 2017-01-26 MED ORDER — GABAPENTIN 400 MG PO CAPS
800.0000 mg | ORAL_CAPSULE | Freq: Two times a day (BID) | ORAL | Status: DC
Start: 2017-01-26 — End: 2017-01-28
  Administered 2017-01-26 – 2017-01-28 (×5): 800 mg via ORAL
  Filled 2017-01-26 (×5): qty 2

## 2017-01-26 MED ORDER — IPRATROPIUM-ALBUTEROL 0.5-2.5 (3) MG/3ML IN SOLN
3.0000 mL | Freq: Once | RESPIRATORY_TRACT | Status: AC
  Administered 2017-01-26: 3 mL via RESPIRATORY_TRACT
  Filled 2017-01-26: qty 3

## 2017-01-26 MED ORDER — NICOTINE 14 MG/24HR TD PT24
14.0000 mg | MEDICATED_PATCH | Freq: Every day | TRANSDERMAL | Status: DC
  Administered 2017-01-26 – 2017-01-28 (×3): 14 mg via TRANSDERMAL
  Filled 2017-01-26 (×3): qty 1

## 2017-01-26 MED ORDER — DOCUSATE SODIUM 100 MG PO CAPS
100.0000 mg | ORAL_CAPSULE | Freq: Three times a day (TID) | ORAL | Status: DC
  Administered 2017-01-26 – 2017-01-28 (×3): 100 mg via ORAL
  Filled 2017-01-26 (×3): qty 1

## 2017-01-26 MED ORDER — TRAZODONE HCL 100 MG PO TABS
100.0000 mg | ORAL_TABLET | Freq: Every evening | ORAL | Status: DC | PRN
  Administered 2017-01-26 – 2017-01-27 (×2): 100 mg via ORAL
  Filled 2017-01-26 (×2): qty 1

## 2017-01-26 MED ORDER — ATORVASTATIN CALCIUM 40 MG PO TABS
40.0000 mg | ORAL_TABLET | Freq: Every day | ORAL | Status: DC
  Administered 2017-01-27: 40 mg via ORAL
  Filled 2017-01-26: qty 1

## 2017-01-26 MED ORDER — DIPHENHYDRAMINE HCL 50 MG/ML IJ SOLN
25.0000 mg | Freq: Once | INTRAMUSCULAR | Status: AC
  Administered 2017-01-26: 25 mg via INTRAVENOUS
  Filled 2017-01-26: qty 1

## 2017-01-26 MED ORDER — CARVEDILOL 6.25 MG PO TABS
6.2500 mg | ORAL_TABLET | Freq: Two times a day (BID) | ORAL | Status: DC
  Administered 2017-01-26 – 2017-01-28 (×3): 6.25 mg via ORAL
  Filled 2017-01-26 (×3): qty 1

## 2017-01-26 MED ORDER — BUSPIRONE HCL 5 MG PO TABS
5.0000 mg | ORAL_TABLET | Freq: Two times a day (BID) | ORAL | Status: DC
  Administered 2017-01-26 – 2017-01-28 (×4): 5 mg via ORAL
  Filled 2017-01-26 (×4): qty 1

## 2017-01-26 MED ORDER — ATORVASTATIN CALCIUM 40 MG PO TABS: 40.0000 mg | ORAL_TABLET | Freq: Every day | ORAL | Status: DC

## 2017-01-26 MED ORDER — DEXAMETHASONE SODIUM PHOSPHATE 10 MG/ML IJ SOLN
10.0000 mg | Freq: Once | INTRAMUSCULAR | Status: AC
  Administered 2017-01-26: 10 mg via INTRAVENOUS
  Filled 2017-01-26: qty 1

## 2017-01-26 MED ORDER — DULOXETINE HCL 60 MG PO CPEP
60.0000 mg | ORAL_CAPSULE | Freq: Every day | ORAL | Status: DC
  Administered 2017-01-26 – 2017-01-28 (×3): 60 mg via ORAL
  Filled 2017-01-26 (×3): qty 1

## 2017-01-26 MED ORDER — IPRATROPIUM-ALBUTEROL 0.5-2.5 (3) MG/3ML IN SOLN: 3.0000 mL | RESPIRATORY_TRACT | Status: DC | PRN

## 2017-01-26 MED ORDER — HEPARIN (PORCINE) IN NACL 100-0.45 UNIT/ML-% IJ SOLN
1400.0000 [IU]/h | INTRAMUSCULAR | Status: DC
  Administered 2017-01-26: 950 [IU]/h via INTRAVENOUS
  Administered 2017-01-27: 1400 [IU]/h via INTRAVENOUS
  Filled 2017-01-26 (×2): qty 250

## 2017-01-26 MED ORDER — SODIUM CHLORIDE 0.9% FLUSH: 3.0000 mL | Freq: Two times a day (BID) | INTRAVENOUS | Status: DC

## 2017-01-26 MED ORDER — PANTOPRAZOLE SODIUM 20 MG PO TBEC
40.0000 mg | DELAYED_RELEASE_TABLET | Freq: Every day | ORAL | Status: DC
  Administered 2017-01-26 – 2017-01-28 (×3): 40 mg via ORAL
  Filled 2017-01-26 (×4): qty 2

## 2017-01-26 MED ORDER — IOPAMIDOL (ISOVUE-370) INJECTION 76%
INTRAVENOUS | Status: AC
  Administered 2017-01-26: 100 mL
  Filled 2017-01-26: qty 100

## 2017-01-26 MED ORDER — GUAIFENESIN ER 600 MG PO TB12
600.0000 mg | ORAL_TABLET | Freq: Two times a day (BID) | ORAL | Status: DC | PRN
Start: 2017-01-26 — End: 2017-01-28

## 2017-01-26 MED ORDER — METOCLOPRAMIDE HCL 5 MG/ML IJ SOLN
10.0000 mg | Freq: Once | INTRAMUSCULAR | Status: AC
  Administered 2017-01-26: 10 mg via INTRAVENOUS
  Filled 2017-01-26: qty 2

## 2017-01-26 MED ORDER — VITAMIN B-12 100 MCG PO TABS
100.0000 ug | ORAL_TABLET | Freq: Every day | ORAL | Status: DC
Start: 2017-01-26 — End: 2017-01-28
  Administered 2017-01-26 – 2017-01-28 (×3): 100 ug via ORAL
  Filled 2017-01-26 (×3): qty 1

## 2017-01-26 MED ORDER — HEPARIN BOLUS VIA INFUSION
4000.0000 [IU] | Freq: Once | INTRAVENOUS | Status: AC
  Administered 2017-01-26: 4000 [IU] via INTRAVENOUS
  Filled 2017-01-26: qty 4000

## 2017-01-26 MED ORDER — NITROGLYCERIN 0.4 MG SL SUBL
0.4000 mg | SUBLINGUAL_TABLET | SUBLINGUAL | Status: DC | PRN
  Administered 2017-01-26 (×3): 0.4 mg via SUBLINGUAL
  Filled 2017-01-26: qty 1

## 2017-01-26 MED ORDER — ASPIRIN-ACETAMINOPHEN-CAFFEINE 250-250-65 MG PO TABS
2.0000 | ORAL_TABLET | Freq: Three times a day (TID) | ORAL | Status: DC | PRN
  Administered 2017-01-28: 2 via ORAL
  Filled 2017-01-26 (×3): qty 2

## 2017-01-26 MED ORDER — ASPIRIN 81 MG PO CHEW
81.0000 mg | CHEWABLE_TABLET | ORAL | Status: AC
  Administered 2017-01-27: 81 mg via ORAL
  Filled 2017-01-26: qty 1

## 2017-01-26 NOTE — ED Provider Notes (Signed)
Complains of migraine headache typical headache she gets daily for several years headache is throbbing in nature bitemporal and frontal. She treated herself with Imitrex and with a sublingual tablet which she does not recall, without relief. Headache was gradual onset yesterday. Denies fever. Other associated symptoms include cough productive of greenish yellowish sputum for one week and shortness of breath. On exam alert Glasgow Coma Score 15 HEENT exam no facial asymmetry neck supple lungs clear auscultation heart regular rate and rhythm neurologic Glasgow Coma Score 15 gait normal Romberg normal pronator drift normal finger to nose normal DTR symmetric bilaterally at knee jerk ankle jerk and biceps toes downward going bilaterally   Orlie Dakin, MD 01/26/17 [phone]

## 2017-01-26 NOTE — ED Notes (Addendum)
Pt initial O2 sat was high 50's-low 60's. Checked on two monitors and tried a pulse oximeter on different fingers and pt's ear. Pt put on O2 and titrated to 6L via  and started started to sat in the mid 90's. Pt has normal mentation. A&Ox4.

## 2017-01-26 NOTE — Progress Notes (Signed)
ANTICOAGULATION CONSULT NOTE  Pharmacy Consult for heparin Indication: chest pain/ACS  Heparin Dosing Weight: 77.9 kg   Assessment: 49 yof with SOB, HA, cough. Trop 0.09. Pharmacy consulted to dose heparin for ACS. Not on anticoagulation PTA. Hg wnl, plt pending, no bleed documented.  Goal of Therapy:  Heparin level 0.3-0.7 units/ml Monitor platelets by anticoagulation protocol: Yes   Plan:  Heparin 4000 unit bolus Start heparin at 950 units/h 6h heparin level Daily heparin level/CBC Monitor s/sx bleeding    Teresa Roberts, PharmD, BCPS Clinical Pharmacist 01/26/2017 12:05 PM

## 2017-01-26 NOTE — Consult Note (Signed)
Cardiology Consult    Patient ID: Teresa Roberts MRN: [id number], DOB/AGE: [id number]   Admit date: 01/26/2017 Date of Consult: 01/26/2017  Primary Physician: Lilian Coma, MD Reason for Consult: Chest pain/posative troponins Primary Cardiologist: Dr. Johnsie Cancel Requesting Provider: Martinique Russo, PA-C  History of Present Illness    Teresa Roberts is a 56 y.o. with past medical history significant for Hypertension, hyperlipidemia, Fibromyalgia, COPD, OSA and CPAP with oxygen at night, chronic bronchitis, half pack per day smoker, IgA MGUS and daily headaches presented to the ED today with intractable headache and increased shortness of breath. On arrival her oxygen saturation was in the 60s and she was placed on 4 L of oxygen. Her headache has now been controlled, however, she did develop chest pain.  She has no cardiac history and no previous cardiac evaluation. No diabetes. Her shortness of breath with exertion has been progressive over the last month. She developed chest pain in the ED during her evaluation for headache and shortness of breath. Her pain is central pressure, non-radiating, improves with palpation with pressure, not associated with position or deep breathing. She denies any previous chest pain or exertional chest Discomfort. She has no orthopnea or palpitations, and only occasional puffiness of her hands and feet which is not present currently.  Her first troponin is mildly elevated at 0.09. BNP is 224. ABG showed a PO2 of 62 PCO2 was 45.4 and pH was normal. Her chest x-ray showed changes of mild CHF.   EKG shows sinus rhythm at 70 bpm with diffuse T-wave inversions, late R-wave progression, and probable left atrial enlargement.  Past Medical History   Past Medical History:  Diagnosis Date  . Fibromyalgia   . Migraines     Past Surgical History:  Procedure Laterality Date  . APPENDECTOMY    . CESAREAN SECTION       Allergies  Allergies  Allergen Reactions  . Citrus  Hives  . Lisinopril Cough    Inpatient Medications    . atorvastatin  40 mg Oral q1800  . carvedilol  6.25 mg Oral BID WC  . DULoxetine  60 mg Oral Daily  . enoxaparin (LOVENOX) injection  40 mg Subcutaneous Q24H  . nicotine  14 mg Transdermal Daily  . sodium chloride flush  3 mL Intravenous Q12H    Family History    Family History  Problem Relation Age of Onset  . Diabetes Mother   . Hypertension Mother   . CAD Mother   . Diabetes Father   . Lung cancer Father   . Diabetes Sister     Social History    Social History   Social History  . Marital status: Married    Spouse name: N/A  . Number of children: N/A  . Years of education: N/A   Occupational History  . Not on file.   Social History Main Topics  . Smoking status: Current Every Day Smoker    Packs/day: 0.50    Years: 20.00  . Smokeless tobacco: Not on file  . Alcohol use 0.6 oz/week    1 Glasses of wine per week  . Drug use: No  . Sexual activity: Not on file   Other Topics Concern  . Not on file   Social History Narrative  . No narrative on file     Review of Systems    General:  No chills, fever, night sweats or weight changes.  Cardiovascular:  Positive for chest pain 2/10 and dyspnea on exertion, no  edema, orthopnea, palpitations, paroxysmal nocturnal dyspnea. Dermatological: No rash, lesions/masses Respiratory: Positive for cough and dyspnea on exertion Urologic: No hematuria, dysuria Abdominal:   No nausea, vomiting, positive for subjective blood in her stools Neurologic:  No visual changes, wkns, changes in mental status. All other systems reviewed and are otherwise negative except as noted above.  Physical Exam    Blood pressure 131/90, pulse 67, temperature 98.7 F (37.1 C), temperature source Oral, resp. rate 16, height 5\' 6"  (1.676 m), weight 191 lb (86.6 kg), last menstrual period 08/19/2012, SpO2 95 %.  General: Pleasant, NAD Psych: Normal affect. Neuro: Alert and oriented X 3.  Moves all extremities spontaneously. HEENT: Normal  Neck: Supple without bruits or JVD. Lungs:  Resp regular and unlabored, CTA. Heart: RRR no s3, s4, or murmurs. Abdomen: Soft, non-tender, non-distended, BS + x 4.  Extremities: No clubbing, cyanosis or edema. DP/PT/Radials 2+ and equal bilaterally.  Labs    Troponin [facility] of Care Test)  Recent Labs  01/26/17 0740  TROPIPOC 0.09*   No results for input(s): CKTOTAL, CKMB, TROPONINI in the last 72 hours. Lab Results  Component Value Date   WBC 7.0 01/26/2017   HGB 12.7 01/26/2017   HCT 40.7 01/26/2017   MCV 76.8 (L) 01/26/2017   PLT PLATELETS APPEAR ADEQUATE 01/26/2017     Recent Labs Lab 01/26/17 0855  NA 140  K 4.8  CL 109  CO2 24  BUN 9  CREATININE 0.79  CALCIUM 9.5  GLUCOSE 100*   No results found for: CHOL, HDL, LDLCALC, TRIG No results found for: [facility]   Radiology Studies    Dg Chest 2 View  Result Date: 01/26/2017 CLINICAL DATA:  Shortness of breath for several days EXAM: CHEST  2 VIEW COMPARISON:  None. FINDINGS: Cardiac shadow is at the upper limits of normal in size. Mild central vascular congestion is noted with very minimal interstitial edema. No sizable effusion or focal infiltrate is noted. No bony abnormality is seen. IMPRESSION: Changes of mild CHF. Electronically Signed   By: Inez Catalina M.D.   On: 01/26/2017 08:22    EKG & Cardiac Imaging   EKG: sinus rhythm at 70 bpm with diffuse T-wave inversions, late R-wave progression, and probable left atrial enlargement.  Echocardiogram: Pending   Assessment & Plan    1. Atypical Chest pain -New-onset chest pain during evaluation for headache and shortness of breath, now nearly resolved. Central chest pressure, nonradiating, not associated with deep breathing or positions. -Patient has also experienced progressive dyspnea on exertion over the last month -CVD risk factors include hypertension, hyperlipidemia, half pack per day smoker, and  obesity -Troponin is mildly elevated at 0.09 which is in the range of demand ischemia possibly related to hypoxia. -EKG is with diffuse T-wave inversions -BNP is mildly elevated at 224.3, no clinical signs of volume overload. Carvedilol has been initiated and Lasix 40 mg IV has been given -Continue to cycle troponins -Check echocardiogram today for evaluation of LV function, valves and wall motion -Heparin drip has been initiated -With elevation in troponin, increased dyspnea on exertion over the past month, and EKG changes patient should have further cardiac testing for definitive evaluation -Plan for right and left heart cath tomorrow.  SCr is 0.79. She is scheduled for 12:00 tomorrow with Dr. Claiborne Billings.  2. Hypoxemia -Patient has a long time smoker and has a history of COPD requiring CPAP with oxygen at night. She usually does not use oxygen during the day -Supplemental oxygen and continue  to seek out pulmonary causes -Plan for right and left heart cath  3. Hypertension -Prazosin is her home med. She has been started on amlodipine in the ED. Currently fairly well-controlled  4 hyperlipidemia -Followed by her PCP.  -Patient is on simvastatin at home. Atorvastatin 40 mg has been initiated here -Will check fasting lipids  5. Tobacco use -1/2 PPD smoker, advise cessation  6. Obstructive sleep apnea -Continue home CPAP with oxygen  Signed, Daune Perch, NP-C 01/26/2017, 1:23 PM Pager: [phone]  Patient examined chart reviewed. Discussed care with patient and family. CRF;s with exertional chest pain and markedly abnormal ECG with inferior lateral T wave inversions Will order echo to r/o HOCM or LVH no murmur On exam. Lungs with poor inspiratory effort no active wheezing. Favor right and left heart cath to risk stratify Discussed risks with patient willing to proceed Lab indicates cannot be done today so will do in am  [employer]

## 2017-01-26 NOTE — Progress Notes (Signed)
ANTICOAGULATION CONSULT NOTE  Pharmacy Consult for heparin Indication: chest pain/ACS  Heparin Dosing Weight: 77.9 kg   Assessment: 21 yof with SOB, HA, cough. Trop 0.09. Pharmacy consulted to dose heparin for ACS. Not on anticoagulation PTA. Hg wnl, plt pending, no bleed documented.  Initial level low at 0.2  Goal of Therapy:  Heparin level 0.3-0.7 units/ml Monitor platelets by anticoagulation protocol: Yes   Plan:  Increase heparin to 1100 units/hr Next level am labs  Levester Fresh, PharmD, BCPS, BCCCP Clinical Pharmacist 01/26/2017 7:36 PM

## 2017-01-26 NOTE — ED Notes (Signed)
Dr Arcelia Jew at bedside.

## 2017-01-26 NOTE — ED Notes (Signed)
Pt disconnected herself to walk to bathroom.  On returning to room pt's O2 was 80-81%.  Pt reporting new bilat anterior chest pain, states pain "feels like squeezing".   MD made aware.

## 2017-01-26 NOTE — ED Notes (Signed)
Decreased pt to 2 L O2 from 5 L O2 at 0720 with Spo2 down to 89-90%.  Increased to 5 L nasal cannula with increase in SpO2 to 96% or higher.

## 2017-01-26 NOTE — ED Notes (Signed)
Notified Dr Arcelia Jew of pt's CP.

## 2017-01-26 NOTE — Progress Notes (Signed)
ABG was obtained on patient after being on 2L nasal cannula for around 10 minutes.  Results given to RN to make sure MD is aware.  Will continue to monitor.   Ref. Range 01/26/2017 07:25  Sample type Unknown ARTERIAL  pH, Arterial Latest Ref Range: 7.350 - 7.450  7.363  pCO2 arterial Latest Ref Range: 32.0 - 48.0 mmHg 45.4  pO2, Arterial Latest Ref Range: 83.0 - 108.0 mmHg 62.0 (L)  TCO2 Latest Ref Range: 0 - 100 mmol/L 27  Bicarbonate Latest Ref Range: 20.0 - 28.0 mmol/L 25.9  O2 Saturation Latest Units: % 90.0  Patient temperature Unknown 98.6 F  Collection site Unknown RADIAL, ALLEN'S T.Marland KitchenMarland Kitchen

## 2017-01-26 NOTE — Plan of Care (Signed)
Problem: Education: Goal: Knowledge of [facility] Education information/materials will improve Outcome: Progressing Pt & family given the pt information guide & information on it. Pt educated on the importance of hand hygiene. Pt is educated on pain scale & plan of care.

## 2017-01-26 NOTE — ED Triage Notes (Signed)
Pt to ED for migraine since last night. Pt states she gets migraine everyday. Pt does not c/o N, V. No complaints of visual difficulties.

## 2017-01-26 NOTE — ED Notes (Signed)
Patient transported to X-ray 

## 2017-01-26 NOTE — ED Provider Notes (Signed)
[facility] DEPT Provider Note   CSN: [id number] Arrival date & time: 01/26/17  8341     History   Chief Complaint Chief Complaint  Patient presents with  . Migraine  . Shortness of Breath    HPI Teresa Roberts is a 56 y.o. female.  Pt w PMHx of fibromyalgia, headaches, COPD on CPAP at night, presents w HA that is worse than her everyday headache. Reports it to be throbbing across the front of her head, associated with photophobia. Denies vision changes, N/V, weakness, changes in speech. She is not on anticoagulants. Reports smoking 1ppd x 19yrs. Pt also reports increasing SOB from baseline with activity. Reports productive cough x1wk with intermittent chills. Denies recent illnesses, fever, lower extremity edema, abd pain.       Past Medical History:  Diagnosis Date  . Fibromyalgia   . Migraines     Patient Active Problem List   Diagnosis Date Noted  . Shortness of breath 01/26/2017  . MGUS (monoclonal gammopathy of unknown significance) 01/11/2012  . Fibromyalgia 01/11/2012  . Bruises easily 01/11/2012    Past Surgical History:  Procedure Laterality Date  . APPENDECTOMY    . CESAREAN SECTION      OB History    No data available       Home Medications    Prior to Admission medications   Medication Sig Start Date End Date Taking? Authorizing Provider  amLODipine (NORVASC) 5 MG tablet Take 5 mg by mouth daily. 10/29/13 01/26/17 Yes Historical Provider, MD  Biotin 1 MG CAPS Take 1 mg by mouth daily.   Yes Historical Provider, MD  DULoxetine (CYMBALTA) 30 MG capsule TAKE ONE CAPSULE BY MOUTH EVERY DAY Patient taking differently: take 60mg  by mouth once daily 01/05/17  Yes Naitik Panwala, PA-C  guaiFENesin (MUCINEX) 600 MG 12 hr tablet Take 600 mg by mouth 2 (two) times daily as needed for cough.   Yes Historical Provider, MD  vitamin B-12 (CYANOCOBALAMIN) 100 MCG tablet Take 100 mcg by mouth daily.   Yes Historical Provider, MD  cephALEXin (KEFLEX) 500 MG  capsule Take 1 capsule (500 mg total) by mouth 2 (two) times daily. Patient not taking: Reported on 01/26/2017 09/12/14   Serita Grit, MD  HYDROcodone-acetaminophen [facility]) 5-325 MG per tablet Take 1 tablet by mouth every 6 (six) hours as needed for moderate pain. Patient not taking: Reported on 01/26/2017 09/12/14   Serita Grit, MD  ondansetron (ZOFRAN) 4 MG tablet Take 1 tablet (4 mg total) by mouth every 8 (eight) hours as needed for nausea or vomiting. Patient not taking: Reported on 01/26/2017 09/12/14   Serita Grit, MD    Family History History reviewed. No pertinent family history.  Social History Social History  Substance Use Topics  . Smoking status: Current Every Day Smoker    Packs/day: 0.50    Years: 20.00  . Smokeless tobacco: Not on file  . Alcohol use 0.6 oz/week    1 Glasses of wine per week     Allergies   Citrus and Lisinopril   Review of Systems Review of Systems  Constitutional: Positive for chills. Negative for fever.  HENT: Negative for congestion and sore throat.   Eyes: Positive for photophobia. Negative for visual disturbance.  Respiratory: Positive for cough (increased cough and sputum x1wk) and shortness of breath (increased SOB x1wk).   Cardiovascular: Negative for chest pain and leg swelling.  Gastrointestinal: Negative for abdominal pain, nausea and vomiting.  Neurological: Positive for headaches (Throbbing, 10/10, across forehead,  associated photophobia. No vision changes). Negative for dizziness, syncope and weakness.       No changes in speech.   Psychiatric/Behavioral: Negative for confusion.     Physical Exam Updated Vital Signs BP 147/95   Pulse 81   Temp 98.7 F (37.1 C) (Oral)   Resp 24   Ht 5\' 6"  (1.676 m)   Wt 86.6 kg   LMP 08/19/2012 Comment: reviewed by Baxter Flattery Dingus RT-T  SpO2 93%   BMI 30.83 kg/m   Physical Exam  Constitutional: She is oriented to person, place, and time. She appears well-developed and well-nourished.    HENT:  Head: Normocephalic and atraumatic.  Eyes: Conjunctivae and EOM are normal. Pupils are equal, round, and reactive to light.  Neck: Normal range of motion. Neck supple.  Cardiovascular: Normal rate, regular rhythm, normal heart sounds and intact distal pulses.  Exam reveals no friction rub.   No murmur heard. Pulmonary/Chest: Effort normal and breath sounds normal. She has no wheezes. She has no rales.  Abdominal: Soft. Bowel sounds are normal. She exhibits no distension and no mass. There is no tenderness.  Musculoskeletal: Normal range of motion.  Neurological: She is alert and oriented to person, place, and time. No cranial nerve deficit or sensory deficit. Coordination normal.  Skin: Skin is warm and dry.  Psychiatric: She has a normal mood and affect. Her behavior is normal.     ED Treatments / Results  Labs (all labs ordered are listed, but only abnormal results are displayed) Labs Reviewed  CBC WITH DIFFERENTIAL/PLATELET - Abnormal; Notable for the following:       Result Value   RBC 5.30 (*)    MCV 76.8 (*)    MCH 24.0 (*)    RDW 20.3 (*)    All other components within normal limits  BASIC METABOLIC PANEL - Abnormal; Notable for the following:    Glucose, Bld 100 (*)    All other components within normal limits  I-STAT TROPOININ, ED - Abnormal; Notable for the following:    Troponin i, poc 0.09 (*)    All other components within normal limits  I-STAT ARTERIAL BLOOD GAS, ED - Abnormal; Notable for the following:    pO2, Arterial 62.0 (*)    All other components within normal limits  TROPONIN I  TROPONIN I  BRAIN NATRIURETIC PEPTIDE  I-STAT ARTERIAL BLOOD GAS, ED    EKG  EKG Interpretation  Date/Time:  Wednesday January 26 2017 08:04:37 EDT Ventricular Rate:  72 PR Interval:    QRS Duration: 75 QT Interval:  409 QTC Calculation: 448 R Axis:   88 Text Interpretation:  Sinus rhythm Abnormal T, consider ischemia, diffuse leads No old tracing to compare  Confirmed by Winfred Leeds  MD, SAM [id number]) on 01/26/2017 8:21:11 AM       Radiology Dg Chest 2 View  Result Date: 01/26/2017 CLINICAL DATA:  Shortness of breath for several days EXAM: CHEST  2 VIEW COMPARISON:  None. FINDINGS: Cardiac shadow is at the upper limits of normal in size. Mild central vascular congestion is noted with very minimal interstitial edema. No sizable effusion or focal infiltrate is noted. No bony abnormality is seen. IMPRESSION: Changes of mild CHF. Electronically Signed   By: Inez Catalina M.D.   On: 01/26/2017 08:22    Procedures Procedures (including critical care time)  Medications Ordered in ED Medications  dexamethasone (DECADRON) injection 10 mg (10 mg Intravenous Given 01/26/17 0804)  diphenhydrAMINE (BENADRYL) injection 25 mg (25 mg Intravenous  Given 01/26/17 0759)  metoCLOPramide (REGLAN) injection 10 mg (10 mg Intravenous Given 01/26/17 0801)  ipratropium-albuterol (DUONEB) 0.5-2.5 (3) MG/3ML nebulizer solution 3 mL (3 mLs Nebulization Given 01/26/17 0826)     Initial Impression / Assessment and Plan / ED Course  I have reviewed the triage vital signs and the nursing notes.  Pertinent labs & imaging results that were available during my care of the patient were reviewed by me and considered in my medical decision making (see chart for details).  Clinical Course as of Jan 26 1021  Wed Jan 26, 2017  0837 Discussed lab results and plan for admission with pt.  [JR]    Clinical Course User Index [JR] [place] Nicole Russo, PA-C    Pt w PMHx of fibromyalgia, COPD on CPAP at night, daily headaches, presents with HA rated 10/10 as well as increased SOB x 1wk. Reports  photophobia, denies N/V, changes in speech, weakness, Cp, LE edema. Reports increase cough and sputum x1wk.  On arrival O2 sat in 60s, put pt on 4L [place] with improvement into 90s. On exam, neuro exam wnl, lungs are clear, abd is benign. CXR showed mild CHF changes. Trop elevated, suspect demand ischemia.  No apparent ischemic EKG findings. Headache treated with Dexamethasone, Benadryl, Reglan with improvement.  Admit for monitoring of respiratory status and serial trop. Internal Medicine Teaching Service will admit.  Patient seen by and discussed with Dr. Winfred Leeds.  Final Clinical Impressions(s) / ED Diagnoses   Final diagnoses:  Shortness of breath  Hypoxia  COPD exacerbation (HCC)  Daily headache    New Prescriptions New Prescriptions   No medications on file     [place] Nicole Russo, PA-C 01/26/17 [place], MD 01/26/17 1704

## 2017-01-26 NOTE — ED Notes (Signed)
Cardiology at bedside.

## 2017-01-26 NOTE — ED Notes (Signed)
EKG given to Dr. Jacubowitz. 

## 2017-01-26 NOTE — Progress Notes (Signed)
Pt refused to watch the Cath video at this time. Hoover Brunette, RN

## 2017-01-26 NOTE — ED Notes (Signed)
Pt ambulated to the restroom with ease. On return to the room pt SpO2 was 58% on RA.  Notified [place], [place].

## 2017-01-26 NOTE — ED Notes (Signed)
Pt titrated off O2 since pt was sating fine on 2L. Pt started to desat to 84%. Pt put back on 4L of O2

## 2017-01-26 NOTE — Progress Notes (Signed)
  Echocardiogram 2D Echocardiogram has been performed.  Deshannon Hinchliffe L Androw 01/26/2017, 3:43 PM

## 2017-01-26 NOTE — H&P (Signed)
Date: 01/26/2017               Patient Name:  Teresa Roberts MRN: [id number]  DOB: September 27, 1961 Age / Sex: 56 y.o., female   PCP: Jonathon Jordan, MD         Medical Service: Internal Medicine Teaching Service         Attending Physician: Dr. Heber Brodhead    First Contact: Dr. Gay Filler Pager: [phone]  Second Contact: Dr. Benjamine Mola Pager: [phone]       After Hours (After 5p/  First Contact Pager: [phone]  weekends / holidays): Second Contact Pager: [phone]   Chief Complaint: Shortness of breath  History of Present Illness: Teresa Roberts is a 56yo woman with PMHx of HTN, OSA on CPAP, tobacco abuse, fibromyalgia, and migraines who presents to the ED with shortness of breath. She reports her SOB started about 1 week ago and occurred anytime she would exert herself. She reports associated productive cough of green mucus and wheezing. She denies any fevers, chills, body aches, sore throat, rhinorrhea, chest pain, or abdominal pain. She denies any sick contacts. She also reports a migraine that started last night and has not been relieved with her normal medications of imitrex and a caffeine containing medication that she cannot recall the name of. She reports this migraine is no different than her typical ones.   In the ED, she was noted to be hypoxic in 60's on admission and then desaturated to 58% on ambulation while not on oxygen. She was breathing comfortably on 5 L oxygen via [place], satting in mid-90s when initially evaluated. EKG notable for T wave inversions in the inferolateral leads, no prior to compare. Her initial troponin elevated at 0.09.   Subsequently after initial evaluation, notified by nurse that patient experiencing new bilateral chest pain after walking to the bathroom. Patient had initially denied any chest pain. Reports pain is 4/10 in severity, on both sides of chest, squeezing/pressure-like, and non-radiating. STAT EKG obtained and shows no changes from admission EKG. Patient given  sublingual nitroglycerin. Reports her migraine has now resolved.  Home Meds:  Norvasc 5 mg daily Cymbalta 60 mg daily Mucinex 600 mg BID PRN Buspar 5 mg BID Colace Gabapentin 800 mg BID Mesalamine 1000 mg QHS Remeron 15 mg QHS Prilosec 40 mg daily Rizatriptan PRN Zocor 10 mg QHS Trazodone 100 mg QHS PRN   Allergies: Allergies as of 01/26/2017 - Review Complete 01/26/2017  Allergen Reaction Noted  . Citrus Hives 09/12/2014  . Lisinopril Cough 09/12/2014   Past Medical History:  Diagnosis Date  . Fibromyalgia   . Migraines     Family History: Mother- HTN, breast cancer. Father- deceased, kidney disease  Social History: Current smoker, 1 ppd x 30 years. Denies alcohol use. Reports marijuana use. Denies IVDU or other illicit drug use. Lives at home with husband, has 1 son. Served in [employer].   Review of Systems: A complete ROS was negative except as per HPI.   Physical Exam: Blood pressure 147/95, pulse 81, temperature 98.7 F (37.1 C), temperature source Oral, resp. rate 24, height 5' 6"  (1.676 m), weight 191 lb (86.6 kg), last menstrual period 08/19/2012, SpO2 93 %. General: Obese middle-aged woman sitting up in bed, breathing comfortably on oxygen via [place] HEENT: [place]/AT, EOMI, sclera anicteric, mucus membranes moist CV: RRR, no m/g/r. No chest wall tenderness. Pulm: CTA bilaterally, breaths non-labored on 5 L O2 Abd: BS+, soft, obese, non-tender Ext: no peripheral edema  Neuro: alert and oriented  x 3, no focal deficits   EKG: Sinus rhythm. T wave inversions in the inferior and lateral leads. No prior EKG to compare.  CXR: Mild pulmonary edema.   Assessment & Plan by Problem:  Hypoxic Respiratory Failure: Patient presenting with 1 week hx SOB found to be hypoxic in the 60's on admission. She has desatted down to 58% on room air with ambulation. ABG consistent with hypoxia with PO2 62. CXR shows mild CHF changes which could be contributing to her SOB. The more concerning  possibilities for her hypoxia are PE and ACS. She developed sudden onset chest pain in the ED in the setting of inferolateral T wave inversions on EKG and elevated troponin of 0.09. Given the degree of her hypoxia, her obesity, and her current smoking there is concern for PE with right heart strain as the etiology. Her Wells score is 0, but I have high clinical suspicion for PE so will obtain a CTA chest. Heparin gtt has been started. - f/u CTA chest - Continue heparin gtt - Oxygen as needed, aim for sats in 88-92% range - Cardiac monitoring   NSTEMI: Patient denies any cardiac history. EKG with inferior and lateral T wave inversions and troponin mildly elevated at 0.09. There is no prior EKG to compare. She did not have chest pain on my initial evaluation but then I was called shortly after that she developed acute onset squeezing/pressure-like chest pain. Repeat EKG did not show any changes from the admission EKG. She was given nitroglycerin and started on a heparin gtt. Cardiology has been consulted. Her NSTEMI could be secondary to right heart strain from PE but will need CTA chest to confirm.  - Heparin gtt - Start Coreg 6.25 mg BID - Start Atorvastatin 40 mg daily - Cardiac monitoring - Nitroglycerin PRN - f/u CTA chest - Echocardiogram - Cardiology consulted, appreciate recommendations   HTN: BP stable in 357S-177L systolic on admission. She is on Amlodipine 5 mg daily at home. - Hold Amlodipine - Beta blocker started as above  OSA on CPAP: Uses CPAP nightly.  - Continue CPAP   Tobacco abuse: Currently smokes 1 ppd and has been smoking for last 30+ years. If she does have a PE will need extensive counseling about importance of quitting to prevent future events. Patient advised to quit smoking regardless. Will provide nicotine patch. - Nicotine patch  Hx IgA MGUS: Initially diagnosed in 2013. She last saw Heme/Onc, Dr. Juliann Mule, in July 2015. Multiple myeloma and smothering MM were  excluded by a normal UPEP with IFE, kappa lambda light chains, and 24 hour total protein levels. Skeletal survey in June 2015 negatve for any bone lesions. She has not had any additional follow up. Her calcium and renal function are within normal limits. She would benefit from being plugged back in with Oncology on discharge. - Outpatient Heme/Onc follow up  Fibromyalgia: On Cymbalta at home. - Continue Cymbalta 60 mg daily   Migraines: Now resolved. Would avoid triptans in the future given her evidence of cardiac ischemia this admission. - Use Tylenol, anti-emetics if needed    Diet: NPO for now DVT PPx: Heparin gtt  Dispo: Admit patient to Observation with expected length of stay less than 2 midnights.  Signed: Juliet Rude, MD 01/26/2017, 9:51 AM  Pager: [phone]

## 2017-01-27 ENCOUNTER — Encounter (HOSPITAL_COMMUNITY)
Admission: EM | Disposition: A | Discharge status: Home / Self Care | Payer: Self-pay | Source: Home / Self Care | Attending: Internal Medicine

## 2017-01-27 DIAGNOSIS — Z9989 Dependence on other enabling machines and devices: Secondary | ICD-10-CM | POA: Diagnosis not present

## 2017-01-27 DIAGNOSIS — M797 Fibromyalgia: Secondary | ICD-10-CM

## 2017-01-27 DIAGNOSIS — R0602 Shortness of breath: Secondary | ICD-10-CM

## 2017-01-27 DIAGNOSIS — R51 Headache: Secondary | ICD-10-CM

## 2017-01-27 DIAGNOSIS — G43909 Migraine, unspecified, not intractable, without status migrainosus: Secondary | ICD-10-CM | POA: Diagnosis not present

## 2017-01-27 DIAGNOSIS — Z79899 Other long term (current) drug therapy: Secondary | ICD-10-CM

## 2017-01-27 DIAGNOSIS — Z862 Personal history of diseases of the blood and blood-forming organs and certain disorders involving the immune mechanism: Secondary | ICD-10-CM | POA: Diagnosis not present

## 2017-01-27 DIAGNOSIS — J9601 Acute respiratory failure with hypoxia: Secondary | ICD-10-CM

## 2017-01-27 DIAGNOSIS — F17210 Nicotine dependence, cigarettes, uncomplicated: Secondary | ICD-10-CM

## 2017-01-27 DIAGNOSIS — G4733 Obstructive sleep apnea (adult) (pediatric): Secondary | ICD-10-CM

## 2017-01-27 DIAGNOSIS — I10 Essential (primary) hypertension: Secondary | ICD-10-CM | POA: Diagnosis not present

## 2017-01-27 DIAGNOSIS — I214 Non-ST elevation (NSTEMI) myocardial infarction: Secondary | ICD-10-CM | POA: Diagnosis not present

## 2017-01-27 DIAGNOSIS — R0902 Hypoxemia: Secondary | ICD-10-CM | POA: Insufficient documentation

## 2017-01-27 DIAGNOSIS — R519 Headache, unspecified: Secondary | ICD-10-CM | POA: Insufficient documentation

## 2017-01-27 HISTORY — PX: RIGHT/LEFT HEART CATH AND CORONARY ANGIOGRAPHY: CATH118266

## 2017-01-27 LAB — BASIC METABOLIC PANEL
ANION GAP: 9 (ref 5–15)
BUN: 13 mg/dL (ref 6–20)
CO2: 25 mmol/L (ref 22–32)
CREATININE: 0.83 mg/dL (ref 0.44–1.00)
Calcium: 9.6 mg/dL (ref 8.9–10.3)
Chloride: 106 mmol/L (ref 101–111)
GFR calc non Af Amer: >60 mL/min (ref >60)
Glucose, Bld: 115 mg/dL — ABNORMAL HIGH (ref 65–99)
POTASSIUM: 4.1 mmol/L (ref 3.5–5.1)
SODIUM: 140 mmol/L (ref 135–145)

## 2017-01-27 LAB — CBC
HCT: 40.8 % (ref 36.0–46.0)
Hemoglobin: 12.5 g/dL (ref 12.0–15.0)
MCH: 23.5 pg — AB (ref 26.0–34.0)
MCHC: 30.6 g/dL (ref 30.0–36.0)
MCV: 76.8 fL — ABNORMAL LOW (ref 78.0–100.0)
PLATELETS: 167 10*3/uL (ref 150–400)
RBC: 5.31 MIL/uL — ABNORMAL HIGH (ref 3.87–5.11)
RDW: 18.3 % — AB (ref 11.5–15.5)
WBC: 9.1 10*3/uL (ref 4.0–10.5)

## 2017-01-27 LAB — POCT I-STAT 3, ART BLOOD GAS (G3+)
BICARBONATE: 26.6 mmol/L (ref 20.0–28.0)
O2 SAT: 92 %
PCO2 ART: 50.6 mmHg — AB (ref 32.0–48.0)
PO2 ART: 70 mmHg — AB (ref 83.0–108.0)
TCO2: 28 mmol/L (ref 0–100)
pH, Arterial: 7.328 — ABNORMAL LOW (ref 7.350–7.450)

## 2017-01-27 LAB — HEPARIN LEVEL (UNFRACTIONATED)
HEPARIN UNFRACTIONATED: 0.19 [IU]/mL — AB (ref 0.30–0.70)
Heparin Unfractionated: 0.62 IU/mL (ref 0.30–0.70)

## 2017-01-27 LAB — POCT I-STAT 3, VENOUS BLOOD GAS (G3P V)
BICARBONATE: 27.6 mmol/L (ref 20.0–28.0)
O2 Saturation: 68 %
PCO2 VEN: 53.5 mmHg (ref 44.0–60.0)
PH VEN: 7.321 (ref 7.250–7.430)
PO2 VEN: 39 mmHg (ref 32.0–45.0)
TCO2: 29 mmol/L (ref 0–100)

## 2017-01-27 LAB — LIPID PANEL
Cholesterol: 117 mg/dL (ref 0–200)
HDL: 24 mg/dL — ABNORMAL LOW (ref >40)
LDL Cholesterol: 66 mg/dL (ref 0–99)
TRIGLYCERIDES: 135 mg/dL (ref <150)
Total CHOL/HDL Ratio: 4.9 RATIO
VLDL: 27 mg/dL (ref 0–40)

## 2017-01-27 SURGERY — RIGHT/LEFT HEART CATH AND CORONARY ANGIOGRAPHY

## 2017-01-27 MED ORDER — SODIUM CHLORIDE 0.9% FLUSH: 3.0000 mL | INTRAVENOUS | Status: DC | PRN

## 2017-01-27 MED ORDER — SODIUM CHLORIDE 0.9 % WEIGHT BASED INFUSION: 1.0000 mL/kg/h | INTRAVENOUS | Status: DC

## 2017-01-27 MED ORDER — HEPARIN (PORCINE) IN NACL 2-0.9 UNIT/ML-% IJ SOLN
INTRAMUSCULAR | Status: DC | PRN
  Administered 2017-01-27: 1000 mL

## 2017-01-27 MED ORDER — FENTANYL CITRATE (PF) 100 MCG/2ML IJ SOLN
INTRAMUSCULAR | Status: AC
  Filled 2017-01-27: qty 2

## 2017-01-27 MED ORDER — FENTANYL CITRATE (PF) 100 MCG/2ML IJ SOLN
INTRAMUSCULAR | Status: DC | PRN
  Administered 2017-01-27: 25 ug via INTRAVENOUS

## 2017-01-27 MED ORDER — SODIUM CHLORIDE 0.9 % WEIGHT BASED INFUSION
3.0000 mL/kg/h | INTRAVENOUS | Status: DC
  Administered 2017-01-27: 3 mL/kg/h via INTRAVENOUS

## 2017-01-27 MED ORDER — LIDOCAINE HCL (PF) 1 % IJ SOLN
INTRAMUSCULAR | Status: DC | PRN
  Administered 2017-01-27: 20 mL

## 2017-01-27 MED ORDER — LIDOCAINE HCL (PF) 1 % IJ SOLN
INTRAMUSCULAR | Status: AC
  Filled 2017-01-27: qty 30

## 2017-01-27 MED ORDER — SODIUM CHLORIDE 0.9% FLUSH
3.0000 mL | Freq: Two times a day (BID) | INTRAVENOUS | Status: DC
  Administered 2017-01-27 – 2017-01-28 (×2): 3 mL via INTRAVENOUS

## 2017-01-27 MED ORDER — SODIUM CHLORIDE 0.9 % WEIGHT BASED INFUSION: 3.0000 mL/kg/h | INTRAVENOUS | Status: DC

## 2017-01-27 MED ORDER — KETOROLAC TROMETHAMINE 30 MG/ML IJ SOLN
30.0000 mg | Freq: Three times a day (TID) | INTRAMUSCULAR | Status: DC | PRN
Start: 2017-01-27 — End: 2017-01-28
  Administered 2017-01-27: 30 mg via INTRAVENOUS
  Filled 2017-01-27: qty 1

## 2017-01-27 MED ORDER — HEPARIN BOLUS VIA INFUSION
2000.0000 [IU] | Freq: Once | INTRAVENOUS | Status: AC
  Administered 2017-01-27: 2000 [IU] via INTRAVENOUS
  Filled 2017-01-27: qty 2000

## 2017-01-27 MED ORDER — HEPARIN (PORCINE) IN NACL 2-0.9 UNIT/ML-% IJ SOLN
INTRAMUSCULAR | Status: AC
  Filled 2017-01-27: qty 1000

## 2017-01-27 MED ORDER — MIDAZOLAM HCL 2 MG/2ML IJ SOLN
INTRAMUSCULAR | Status: AC
  Filled 2017-01-27: qty 2

## 2017-01-27 MED ORDER — SODIUM CHLORIDE 0.9 % IV SOLN: 250.0000 mL | INTRAVENOUS | Status: DC | PRN

## 2017-01-27 MED ORDER — DIAZEPAM 5 MG PO TABS: 5.0000 mg | ORAL_TABLET | ORAL | Status: DC | PRN

## 2017-01-27 MED ORDER — ENOXAPARIN SODIUM 40 MG/0.4ML ~~LOC~~ SOLN
40.0000 mg | SUBCUTANEOUS | Status: DC
  Filled 2017-01-27: qty 0.4

## 2017-01-27 MED ORDER — MIDAZOLAM HCL 2 MG/2ML IJ SOLN
INTRAMUSCULAR | Status: DC | PRN
  Administered 2017-01-27: 2 mg via INTRAVENOUS

## 2017-01-27 MED ORDER — IOPAMIDOL (ISOVUE-370) INJECTION 76%
INTRAVENOUS | Status: AC
  Filled 2017-01-27: qty 100

## 2017-01-27 MED ORDER — SODIUM CHLORIDE 0.9% FLUSH
3.0000 mL | Freq: Two times a day (BID) | INTRAVENOUS | Status: DC
Start: 2017-01-27 — End: 2017-01-27

## 2017-01-27 MED ORDER — SODIUM CHLORIDE 0.9 % WEIGHT BASED INFUSION
1.0000 mL/kg/h | INTRAVENOUS | Status: AC
  Administered 2017-01-27: 1 mL/kg/h via INTRAVENOUS

## 2017-01-27 MED ORDER — ACETAMINOPHEN 325 MG PO TABS
650.0000 mg | ORAL_TABLET | ORAL | Status: DC | PRN
  Administered 2017-01-28: 650 mg via ORAL
  Filled 2017-01-27: qty 2

## 2017-01-27 MED ORDER — IOPAMIDOL (ISOVUE-370) INJECTION 76%
INTRAVENOUS | Status: DC | PRN
  Administered 2017-01-27: 70 mL via INTRA_ARTERIAL

## 2017-01-27 MED ORDER — ONDANSETRON HCL 4 MG/2ML IJ SOLN: 4.0000 mg | Freq: Four times a day (QID) | INTRAMUSCULAR | Status: DC | PRN

## 2017-01-27 SURGICAL SUPPLY — 11 items
CATH INFINITI 5FR MULTPACK ANG (CATHETERS) ×2 IMPLANT
CATH SWAN GANZ 7F STRAIGHT (CATHETERS) ×2 IMPLANT
KIT HEART LEFT (KITS) ×2 IMPLANT
KIT HEART RIGHT NAMIC (KITS) ×2 IMPLANT
PACK CARDIAC CATHETERIZATION (CUSTOM PROCEDURE TRAY) ×2 IMPLANT
SHEATH PINNACLE 5F 10CM (SHEATH) ×2 IMPLANT
SHEATH PINNACLE 7F 10CM (SHEATH) ×2 IMPLANT
SYR MEDRAD MARK V 150ML (SYRINGE) ×2 IMPLANT
TRANSDUCER W/STOPCOCK (MISCELLANEOUS) ×4 IMPLANT
WIRE EMERALD 3MM-J .025X260CM (WIRE) ×2 IMPLANT
WIRE EMERALD 3MM-J .035X150CM (WIRE) ×2 IMPLANT

## 2017-01-27 NOTE — Plan of Care (Signed)
Problem: Cardiac: Goal: Ability to achieve and maintain adequate cardiovascular perfusion will improve Outcome: Progressing Pt has not had any chest pain since arrival to floor. Pt is scheduled for a cardiac cath today.

## 2017-01-27 NOTE — H&P (View-Only) (Signed)
Progress Note  Patient Name: Teresa Roberts Date of Encounter: 01/27/2017  Primary Cardiologist: New- Dr. Johnsie Cancel  Subjective   Patient rest well without further chest pain. Her breathing is better since admission.   Inpatient Medications    Scheduled Meds: . aspirin  81 mg Oral Pre-Cath  . atorvastatin  40 mg Oral QHS  . busPIRone  5 mg Oral BID  . carvedilol  6.25 mg Oral BID WC  . docusate sodium  100 mg Oral TID  . DULoxetine  60 mg Oral Daily  . gabapentin  800 mg Oral BID  . mirtazapine  15 mg Oral QHS  . nicotine  14 mg Transdermal Daily  . pantoprazole  40 mg Oral Daily  . sodium chloride flush  3 mL Intravenous Q12H  . vitamin B-12  100 mcg Oral Daily   Continuous Infusions: . heparin 1,400 Units/hr (01/27/17 0659)   PRN Meds: aspirin-acetaminophen-caffeine, guaiFENesin, ipratropium-albuterol, nitroGLYCERIN, traZODone   Vital Signs    Vitals:   01/26/17 1626 01/26/17 1718 01/26/17 1900 01/27/17 0607  BP: (!) 143/79  127/70 116/65  Pulse: 63 72 66 98  Resp: 18  18 19   Temp: 98 F (36.7 C)  97.3 F (36.3 C) 98.2 F (36.8 C)  TempSrc: Oral  Oral Oral  SpO2: 96%  100% 100%  Weight:    187 lb 8 oz (85 kg)  Height:        Intake/Output Summary (Last 24 hours) at 01/27/17 0739 Last data filed at 01/27/17 9323  Gross per 24 hour  Intake             2000 ml  Output             1500 ml  Net              500 ml   Filed Weights   01/26/17 0656 01/26/17 1545 01/27/17 0607  Weight: 191 lb (86.6 kg) 188 lb 3.2 oz (85.4 kg) 187 lb 8 oz (85 kg)    Telemetry    NSR 60's-70's - Personally Reviewed  ECG    No new tracings. EKG on 3/14 showed sinus rhythm at 70 bpm with diffuse T-wave inversions, late R-wave progression, and probable left atrial enlargement  Physical Exam   GEN: No acute distress.   Neck: No JVD Cardiac: RRR, no murmurs, rubs, or gallops.  Respiratory: Clear to auscultation bilaterally. GI: Soft, nontender, non-distended  MS: No edema;  No deformity. Neuro:  Nonfocal  Psych: Normal affect   Labs    Chemistry Recent Labs Lab 01/26/17 0855  NA 140  K 4.8  CL 109  CO2 24  GLUCOSE 100*  BUN 9  CREATININE 0.79  CALCIUM 9.5  GFRNONAA >60  GFRAA >60  ANIONGAP 7     Hematology Recent Labs Lab 01/26/17 0717 01/27/17 0246  WBC 7.0 9.1  RBC 5.30* 5.31*  HGB 12.7 12.5  HCT 40.7 40.8  MCV 76.8* 76.8*  MCH 24.0* 23.5*  MCHC 31.2 30.6  RDW 20.3* 18.3*  PLT PLATELETS APPEAR ADEQUATE 167    Cardiac Enzymes Recent Labs Lab 01/26/17 1320 01/26/17 1851  TROPONINI 0.12* 0.13*    Recent Labs Lab 01/26/17 0740  TROPIPOC 0.09*     BNP Recent Labs Lab 01/26/17 0717  BNP 224.3*     DDimer No results for input(s): DDIMER in the last 168 hours.   Radiology    Dg Chest 2 View  Result Date: 01/26/2017 CLINICAL DATA:  Shortness of  breath for several days EXAM: CHEST  2 VIEW COMPARISON:  None. FINDINGS: Cardiac shadow is at the upper limits of normal in size. Mild central vascular congestion is noted with very minimal interstitial edema. No sizable effusion or focal infiltrate is noted. No bony abnormality is seen. IMPRESSION: Changes of mild CHF. Electronically Signed   By: Inez Catalina M.D.   On: 01/26/2017 08:22   Ct Angio Chest Pe W Or Wo Contrast  Result Date: 01/26/2017 CLINICAL DATA:  Hypoxia EXAM: CT ANGIOGRAPHY CHEST WITH CONTRAST TECHNIQUE: Multidetector CT imaging of the chest was performed using the standard protocol during bolus administration of intravenous contrast. Multiplanar CT image reconstructions and MIPs were obtained to evaluate the vascular anatomy. CONTRAST:  62 mL Isovue 370 intravenous COMPARISON:  Chest x-ray 01/26/2017 FINDINGS: Cardiovascular: Satisfactory opacification of the pulmonary arteries to the segmental level. No evidence of pulmonary embolism. Non aneurysmal aorta. There is cardiomegaly. Minimal coronary artery calcification. No large pericardial effusion.  Mediastinum/Nodes: Nonspecific mediastinal lymph nodes. Midline trachea. No thyroid mass. Esophagus within normal limits. Lungs/Pleura: Lungs are clear. No pleural effusion or pneumothorax. Upper Abdomen: Mild reflux of contrast into the hepatic veins. No acute abnormality. Musculoskeletal: No chest wall abnormality. No acute or significant osseous findings. Mild degenerative changes. Review of the MIP images confirms the above findings. IMPRESSION: 1. Negative for acute pulmonary embolus 2. Cardiomegaly. Mild reflux of contrast into the hepatic veins suggests elevated right heart pressure. 3. Clear lung fields. Electronically Signed   By: Donavan Foil M.D.   On: 01/26/2017 23:15    Cardiac Studies   Echo 01/26/17 Study Conclusions  - Left ventricle: The cavity size was normal. Wall thickness was   normal. Systolic function was normal. The estimated ejection   fraction was in the range of 60% to 65%. Wall motion was normal;   there were no regional wall motion abnormalities. Features are   consistent with a pseudonormal left ventricular filling pattern,   with concomitant abnormal relaxation and increased filling   pressure (grade 2 diastolic dysfunction). - Aortic valve: Mildly calcified annulus. Valve area (Vmax): 1.94   cm^2. Valve area (Vmean): 2.16 cm^2. - Right atrium: The atrium was mildly dilated.  Patient Profile     56 y.o. female with past medical history significant for Hypertension, hyperlipidemia, Fibromyalgia, COPD, OSA and CPAP with oxygen at night, chronic bronchitis, half pack per day smoker, IgA MGUS and daily headaches presented to the ED on 3/14 with intractable headache and increased shortness of breath. On arrival her oxygen saturation was in the 60s and she was placed on 4 L of oxygen. Her headache has now been controlled, however, she did develop chest pain. She has no cardiac history.   Assessment & Plan    1. Atypical Chest pain -New-onset chest pain during  evaluation for headache and shortness of breath, now nearly resolved. Central chest pressure, nonradiating, not associated with deep breathing or positions. -Patient has also experienced progressive dyspnea on exertion over the last month -CVD risk factors include hypertension, hyperlipidemia, half pack per day smoker, and obesity -Troponin is mildly elevated at 0.09, 0.12, 0.13 -EKG is with diffuse T-wave inversions -BNP is mildly elevated at 224.3, no clinical signs of volume overload. Carvedilol has been initiated and Lasix 40 mg IV has been given with improvement in breathing -echocardiogram showed normal LV systolic function, no RWMA, grade 2 DD -Heparin infusing -With elevation in troponin, increased dyspnea on exertion over the past month, and EKG changes patient should  have further cardiac testing for definitive evaluation -Plan for right and left heart cath today.  SCr is 0.79. She is scheduled for 12:00 today with Dr. Claiborne Billings.  2. Diastolic dysfunction -Pt has had shortness of breath over the last month. -BNP 224 on admission. Responded well to lasix with improvement in her shortness of breath -may benefit from lasix therapy at home.  3. Hypoxemia -Patient is a long time smoker and has a history of COPD requiring CPAP with oxygen at night. She usually does not use oxygen during the day -Supplemental oxygen and continue to seek out pulmonary causes -Plan for right and left heart cath  4. Hypertension -Prazosin is her home med. She has been started on amlodipine in the ED. Currently well-controlled  5. hyperlipidemia -Followed by her PCP.  -Patient is on simvastatin at home. Atorvastatin 40 mg has been initiated here -Lipid panel: LDL 66, well controlled  6. Tobacco use -1/2 PPD smoker, advise cessation  7. Obstructive sleep apnea -Continue home CPAP with oxygen  Signed, Daune Perch, NP  01/27/2017, 7:39 AM    Patient examined chart reviewed Lungs with some basilar  atelectasis Has significant COPD and OSA Amlodipne started for BP ? Diastolic dysfunction improved breathing with lasix in ER.  Further recommendations based on results of heart cath   [facility]

## 2017-01-27 NOTE — Progress Notes (Signed)
Site area: Right groin a 5 french arterial and 7 french venous sheath was removed  Site Prior to Removal:  Level 0  Pressure Applied For 20 MINUTES    Bedrest Beginning at 1655p  Manual:   Yes.    Patient Status During Pull:  stable  Post Pull Groin Site:  Level 0  Post Pull Instructions Given:  Yes.    Post Pull Pulses Present:  Yes.    Dressing Applied:  Yes.    Comments:  VS remain astable during sheath pull

## 2017-01-27 NOTE — Plan of Care (Signed)
Problem: Education: Goal: Knowledge of [facility] Education information/materials will improve Outcome: Completed/Met Date Met: 01/27/17 Pt given general education packet. Pt & family oriented to unit & room. Pt educated & given information on tobacco cessation. Pt educated on pain rating & scale.

## 2017-01-27 NOTE — Progress Notes (Signed)
Subjective: Currently, the patient is feeling well. She denies any further chest pain and reports that her headache is mostly resolved. She has no SOB at breath at rest and was not wearing O2 when we saw her this AM. She does still endorse SOB with activity.  Interval Events: CTA negative for PE. Echo w/ G2DD.  Objective: Vital signs in last 24 hours: Vitals:   01/26/17 1718 01/26/17 1900 01/27/17 0607 01/27/17 0855  BP:  127/70 116/65 (!) 107/57  Pulse: 72 66 98 90  Resp:  18 19   Temp:  97.3 F (36.3 C) 98.2 F (36.8 C)   TempSrc:  Oral Oral   SpO2:  100% 100%   Weight:   187 lb 8 oz (85 kg)   Height:       Intake/Output:  03/14 0701 - 03/15 0700 In: 2000 [P.O.:600; I.V.:1400] Out: 1500 [Urine:1500]    Physical Exam: Physical Exam  Constitutional: She appears well-developed. She is cooperative. No distress.  Cardiovascular: Normal rate, regular rhythm, normal heart sounds and normal pulses.  Exam reveals no gallop.   No murmur heard. Pulmonary/Chest: Effort normal and breath sounds normal. No respiratory distress. Breasts are symmetrical.  Abdominal: Soft. Bowel sounds are normal. There is no tenderness.  Musculoskeletal: She exhibits no edema.   Labs: CBC:  Recent Labs Lab 01/26/17 0717 01/27/17 0246  WBC 7.0 9.1  NEUTROABS 5.3  --   HGB 12.7 12.5  HCT 40.7 40.8  MCV 76.8* 76.8*  PLT PLATELETS APPEAR ADEQUATE 469   Metabolic Panel:  Recent Labs Lab 01/26/17 0855 01/26/17 1638 01/27/17 0827  NA 140  --  140  K 4.8  --  4.1  CL 109  --  106  CO2 24  --  25  GLUCOSE 100*  --  115*  BUN 9  --  13  CREATININE 0.79  --  0.83  CALCIUM 9.5  --  9.6  LABPROT  --  14.4  --   INR  --  1.11  --    Cardiac Labs:  Recent Labs Lab 01/26/17 0717 01/26/17 0740 01/26/17 1320 01/26/17 1851  TROPIPOC  --  0.09*  --   --   TROPONINI  --   --  0.12* 0.13*  BNP 224.3*  --   --   --     Medications: Infusions: . sodium chloride 1 mL/kg/hr (01/27/17  1013)  . heparin 1,400 Units/hr (01/27/17 0659)   Scheduled Medications: . atorvastatin  40 mg Oral QHS  . busPIRone  5 mg Oral BID  . carvedilol  6.25 mg Oral BID WC  . docusate sodium  100 mg Oral TID  . DULoxetine  60 mg Oral Daily  . gabapentin  800 mg Oral BID  . mirtazapine  15 mg Oral QHS  . nicotine  14 mg Transdermal Daily  . pantoprazole  40 mg Oral Daily  . sodium chloride flush  3 mL Intravenous Q12H  . sodium chloride flush  3 mL Intravenous Q12H  . vitamin B-12  100 mcg Oral Daily   PRN Medications: sodium chloride, aspirin-acetaminophen-caffeine, guaiFENesin, ipratropium-albuterol, nitroGLYCERIN, sodium chloride flush, traZODone  Assessment/Plan: Pt is a 56 y.o. yo female with a PMHx of OSA, HTN, Tobacco abuse, and Migraines who was admitted on 01/26/2017 with symptoms of hypoxic resp failure and atypical CP after presentation to the ED for HA.  Hypoxic Respiratory Failure: Patient presenting with 1 week hx SOB found to be hypoxic in the 60's on admission.  She has desatted down to 58% on room air with ambulation. ABG consistent with hypoxia with PO2 62. CXR shows mild CHF changes but echo demonstrates G2DD w/ pEF. DDx included PE but CTA was neg. Concern for ACS given elevated trop to 0.13 w/ sudden onset chest pain in the ED and persistent inferolateral T wave inversions on EKG. Other risk factors include HTN, obesity, and tobacco abuse. Cards following w/ plan for Left and Right cath today. On ACS Heparin ggt. - Continue heparin gtt - Oxygen as needed, aim for sats in 88-92% range - Cardiac monitoring - Appreciate Cards recs  NSTEMI: Atypical CP now resolved w/ elevated trop to 0.13 and new TWI. Echo w/o wall motion abnl. Scheduled for cath as above. On ACS heparin as above. - ACS Heparin gtt - Coreg 6.25 mg BID - Atorvastatin 40 mg daily - Cardiac monitoring - Nitroglycerin PRN - Cars recs as above   HTN: BP stable in 426S-341D systolic on admission. No  normotensive on Coreg. She was on Amlodipine 5 mg daily at home. - Hold Amlodipine - Coreg as above  OSA on CPAP: Uses CPAP nightly.  - Continue CPAP   Tobacco abuse: Currently smokes 1 ppd and has been smoking for last 30+ years.Will provide nicotine patch.  Hx IgA MGUS: Initially diagnosed in 2013. She last saw Heme/Onc, Dr. Juliann Mule, in July 2015. Multiple myeloma and smoldering MM were excluded by a normal UPEP with IFE, kappa / lambda light chains, and 24 hour total protein levels. Skeletal survey in June 2015 negatve for any bone lesions. She has not had any additional follow up. Her calcium and renal function are within normal limits. - Outpatient Heme/Onc follow up  Fibromyalgia: Continue home Cymbalta 65m qD  Migraines: Now resolved. Would avoid triptans in the future given her evidence of cardiac ischemia this admission. - Use Tylenol, anti-emetics if needed  Length of Stay: 1 day(s) Dispo: Anticipated discharge pending cardiac w/u.  BHolley Raring MD Pager: [phone](7AM-5PM) 01/27/2017, 10:47 AM

## 2017-01-27 NOTE — Progress Notes (Signed)
Progress Note  Patient Name: Teresa Roberts Date of Encounter: 01/27/2017  Primary Cardiologist: New- Dr. Johnsie Cancel  Subjective   Patient rest well without further chest pain. Her breathing is better since admission.   Inpatient Medications    Scheduled Meds: . aspirin  81 mg Oral Pre-Cath  . atorvastatin  40 mg Oral QHS  . busPIRone  5 mg Oral BID  . carvedilol  6.25 mg Oral BID WC  . docusate sodium  100 mg Oral TID  . DULoxetine  60 mg Oral Daily  . gabapentin  800 mg Oral BID  . mirtazapine  15 mg Oral QHS  . nicotine  14 mg Transdermal Daily  . pantoprazole  40 mg Oral Daily  . sodium chloride flush  3 mL Intravenous Q12H  . vitamin B-12  100 mcg Oral Daily   Continuous Infusions: . heparin 1,400 Units/hr (01/27/17 0659)   PRN Meds: aspirin-acetaminophen-caffeine, guaiFENesin, ipratropium-albuterol, nitroGLYCERIN, traZODone   Vital Signs    Vitals:   01/26/17 1626 01/26/17 1718 01/26/17 1900 01/27/17 0607  BP: (!) 143/79  127/70 116/65  Pulse: 63 72 66 98  Resp: 18  18 19   Temp: 98 F (36.7 C)  97.3 F (36.3 C) 98.2 F (36.8 C)  TempSrc: Oral  Oral Oral  SpO2: 96%  100% 100%  Weight:    187 lb 8 oz (85 kg)  Height:        Intake/Output Summary (Last 24 hours) at 01/27/17 0739 Last data filed at 01/27/17 2423  Gross per 24 hour  Intake             2000 ml  Output             1500 ml  Net              500 ml   Filed Weights   01/26/17 0656 01/26/17 1545 01/27/17 0607  Weight: 191 lb (86.6 kg) 188 lb 3.2 oz (85.4 kg) 187 lb 8 oz (85 kg)    Telemetry    NSR 60's-70's - Personally Reviewed  ECG    No new tracings. EKG on 3/14 showed sinus rhythm at 70 bpm with diffuse T-wave inversions, late R-wave progression, and probable left atrial enlargement  Physical Exam   GEN: No acute distress.   Neck: No JVD Cardiac: RRR, no murmurs, rubs, or gallops.  Respiratory: Clear to auscultation bilaterally. GI: Soft, nontender, non-distended  MS: No edema;  No deformity. Neuro:  Nonfocal  Psych: Normal affect   Labs    Chemistry Recent Labs Lab 01/26/17 0855  NA 140  K 4.8  CL 109  CO2 24  GLUCOSE 100*  BUN 9  CREATININE 0.79  CALCIUM 9.5  GFRNONAA >60  GFRAA >60  ANIONGAP 7     Hematology Recent Labs Lab 01/26/17 0717 01/27/17 0246  WBC 7.0 9.1  RBC 5.30* 5.31*  HGB 12.7 12.5  HCT 40.7 40.8  MCV 76.8* 76.8*  MCH 24.0* 23.5*  MCHC 31.2 30.6  RDW 20.3* 18.3*  PLT PLATELETS APPEAR ADEQUATE 167    Cardiac Enzymes Recent Labs Lab 01/26/17 1320 01/26/17 1851  TROPONINI 0.12* 0.13*    Recent Labs Lab 01/26/17 0740  TROPIPOC 0.09*     BNP Recent Labs Lab 01/26/17 0717  BNP 224.3*     DDimer No results for input(s): DDIMER in the last 168 hours.   Radiology    Dg Chest 2 View  Result Date: 01/26/2017 CLINICAL DATA:  Shortness of  breath for several days EXAM: CHEST  2 VIEW COMPARISON:  None. FINDINGS: Cardiac shadow is at the upper limits of normal in size. Mild central vascular congestion is noted with very minimal interstitial edema. No sizable effusion or focal infiltrate is noted. No bony abnormality is seen. IMPRESSION: Changes of mild CHF. Electronically Signed   By: Inez Catalina M.D.   On: 01/26/2017 08:22   Ct Angio Chest Pe W Or Wo Contrast  Result Date: 01/26/2017 CLINICAL DATA:  Hypoxia EXAM: CT ANGIOGRAPHY CHEST WITH CONTRAST TECHNIQUE: Multidetector CT imaging of the chest was performed using the standard protocol during bolus administration of intravenous contrast. Multiplanar CT image reconstructions and MIPs were obtained to evaluate the vascular anatomy. CONTRAST:  62 mL Isovue 370 intravenous COMPARISON:  Chest x-ray 01/26/2017 FINDINGS: Cardiovascular: Satisfactory opacification of the pulmonary arteries to the segmental level. No evidence of pulmonary embolism. Non aneurysmal aorta. There is cardiomegaly. Minimal coronary artery calcification. No large pericardial effusion.  Mediastinum/Nodes: Nonspecific mediastinal lymph nodes. Midline trachea. No thyroid mass. Esophagus within normal limits. Lungs/Pleura: Lungs are clear. No pleural effusion or pneumothorax. Upper Abdomen: Mild reflux of contrast into the hepatic veins. No acute abnormality. Musculoskeletal: No chest wall abnormality. No acute or significant osseous findings. Mild degenerative changes. Review of the MIP images confirms the above findings. IMPRESSION: 1. Negative for acute pulmonary embolus 2. Cardiomegaly. Mild reflux of contrast into the hepatic veins suggests elevated right heart pressure. 3. Clear lung fields. Electronically Signed   By: Donavan Foil M.D.   On: 01/26/2017 23:15    Cardiac Studies   Echo 01/26/17 Study Conclusions  - Left ventricle: The cavity size was normal. Wall thickness was   normal. Systolic function was normal. The estimated ejection   fraction was in the range of 60% to 65%. Wall motion was normal;   there were no regional wall motion abnormalities. Features are   consistent with a pseudonormal left ventricular filling pattern,   with concomitant abnormal relaxation and increased filling   pressure (grade 2 diastolic dysfunction). - Aortic valve: Mildly calcified annulus. Valve area (Vmax): 1.94   cm^2. Valve area (Vmean): 2.16 cm^2. - Right atrium: The atrium was mildly dilated.  Patient Profile     56 y.o. female with past medical history significant for Hypertension, hyperlipidemia, Fibromyalgia, COPD, OSA and CPAP with oxygen at night, chronic bronchitis, half pack per day smoker, IgA MGUS and daily headaches presented to the ED on 3/14 with intractable headache and increased shortness of breath. On arrival her oxygen saturation was in the 60s and she was placed on 4 L of oxygen. Her headache has now been controlled, however, she did develop chest pain. She has no cardiac history.   Assessment & Plan    1. Atypical Chest pain -New-onset chest pain during  evaluation for headache and shortness of breath, now nearly resolved. Central chest pressure, nonradiating, not associated with deep breathing or positions. -Patient has also experienced progressive dyspnea on exertion over the last month -CVD risk factors include hypertension, hyperlipidemia, half pack per day smoker, and obesity -Troponin is mildly elevated at 0.09, 0.12, 0.13 -EKG is with diffuse T-wave inversions -BNP is mildly elevated at 224.3, no clinical signs of volume overload. Carvedilol has been initiated and Lasix 40 mg IV has been given with improvement in breathing -echocardiogram showed normal LV systolic function, no RWMA, grade 2 DD -Heparin infusing -With elevation in troponin, increased dyspnea on exertion over the past month, and EKG changes patient should  have further cardiac testing for definitive evaluation -Plan for right and left heart cath today.  SCr is 0.79. She is scheduled for 12:00 today with Dr. Claiborne Billings.  2. Diastolic dysfunction -Pt has had shortness of breath over the last month. -BNP 224 on admission. Responded well to lasix with improvement in her shortness of breath -may benefit from lasix therapy at home.  3. Hypoxemia -Patient is a long time smoker and has a history of COPD requiring CPAP with oxygen at night. She usually does not use oxygen during the day -Supplemental oxygen and continue to seek out pulmonary causes -Plan for right and left heart cath  4. Hypertension -Prazosin is her home med. She has been started on amlodipine in the ED. Currently well-controlled  5. hyperlipidemia -Followed by her PCP.  -Patient is on simvastatin at home. Atorvastatin 40 mg has been initiated here -Lipid panel: LDL 66, well controlled  6. Tobacco use -1/2 PPD smoker, advise cessation  7. Obstructive sleep apnea -Continue home CPAP with oxygen  Signed, Daune Perch, NP  01/27/2017, 7:39 AM    Patient examined chart reviewed Lungs with some basilar  atelectasis Has significant COPD and OSA Amlodipne started for BP ? Diastolic dysfunction improved breathing with lasix in ER.  Further recommendations based on results of heart cath   [facility]

## 2017-01-27 NOTE — Progress Notes (Signed)
ANTICOAGULATION CONSULT NOTE - Follow Up Consult  Pharmacy Consult for heparin Indication: chest pain/ACS  Labs:  Recent Labs  01/26/17 0717 01/26/17 0855 01/26/17 1320 01/26/17 1638 01/26/17 1851 01/27/17 0246  HGB 12.7  --   --   --   --  12.5  HCT 40.7  --   --   --   --  40.8  PLT PLATELETS APPEAR ADEQUATE  --   --   --   --  167  LABPROT  --   --   --  14.4  --   --   INR  --   --   --  1.11  --   --   HEPARINUNFRC  --   --   --   --  0.20* 0.19*  CREATININE  --  0.79  --   --   --   --   TROPONINI  --   --  0.12*  --  0.13*  --     Assessment: 56yo female remains subtherapeutic on heparin with lower heparin level despite increased rate; no gtt issues per RN.  Goal of Therapy:  Heparin level 0.3-0.7 units/ml   Plan:  Will rebolus with heparin 2000 units and increase gtt by 3 units/kg/hr to 1400 units/hr and check level in 6hr.  Wynona Neat, PharmD, BCPS  01/27/2017,4:11 AM

## 2017-01-27 NOTE — Interval H&P Note (Signed)
Cath Lab Visit (complete for each Cath Lab visit)  Clinical Evaluation Leading to the Procedure:   ACS: Yes.    Non-ACS:    Anginal Classification: CCS III  Anti-ischemic medical therapy: Minimal Therapy (1 class of medications)  Non-Invasive Test Results: No non-invasive testing performed  Prior CABG: No previous CABG      History and Physical Interval Note:  01/27/2017 3:18 PM  Teresa Roberts  has presented today for surgery, with the diagnosis of unstable angina  The various methods of treatment have been discussed with the patient and family. After consideration of risks, benefits and other options for treatment, the patient has consented to  Procedure(s): Right/Left Heart Cath and Coronary Angiography (N/A) as a surgical intervention .  The patient's history has been reviewed, patient examined, no change in status, stable for surgery.  I have reviewed the patient's chart and labs.  Questions were answered to the patient's satisfaction.     Shelva Majestic

## 2017-01-27 NOTE — Progress Notes (Signed)
ANTICOAGULATION CONSULT NOTE - Follow Up Consult  Pharmacy Consult for Heparin Indication: chest pain/ACS  Allergies  Allergen Reactions  . Citrus Hives  . Lisinopril Cough    Patient Measurements: Height: 5\' 4"  (162.6 cm) Weight: 187 lb 8 oz (85 kg) IBW/kg (Calculated) : 54.7 Heparin Dosing Weight: 78 kg  Vital Signs: Temp: 98.2 F (36.8 C) (03/15 0607) Temp Source: Oral (03/15 0607) BP: 107/57 (03/15 0855) Pulse Rate: 90 (03/15 0855)  Labs:  Recent Labs  01/26/17 0717 01/26/17 0855 01/26/17 1320 01/26/17 1638 01/26/17 1851 01/27/17 0246 01/27/17 0827 01/27/17 0958  HGB 12.7  --   --   --   --  12.5  --   --   HCT 40.7  --   --   --   --  40.8  --   --   PLT PLATELETS APPEAR ADEQUATE  --   --   --   --  167  --   --   LABPROT  --   --   --  14.4  --   --   --   --   INR  --   --   --  1.11  --   --   --   --   HEPARINUNFRC  --   --   --   --  0.20* 0.19*  --  0.62  CREATININE  --  0.79  --   --   --   --  0.83  --   TROPONINI  --   --  0.12*  --  0.13*  --   --   --     Estimated Creatinine Clearance: 81.7 mL/min (by C-G formula based on SCr of 0.83 mg/dL).  Assessment:   Heparin level is now therapeutic (0.62) on 1400 units/hr.   For cardiac cath today.   Goal of Therapy:  Heparin level 0.3-0.7 units/ml Monitor platelets by anticoagulation protocol: Yes   Plan:   Continue heparin drip at 1400 units/hr.  Daily heparin level and CBC while on heparin.  Follow up post-cath.  Arty Baumgartner, [place] Pager: [phone] 01/27/2017,11:37 AM

## 2017-01-28 ENCOUNTER — Encounter (HOSPITAL_COMMUNITY): Payer: Self-pay | Admitting: Cardiovascular Disease

## 2017-01-28 DIAGNOSIS — R0902 Hypoxemia: Secondary | ICD-10-CM

## 2017-01-28 DIAGNOSIS — J9611 Chronic respiratory failure with hypoxia: Secondary | ICD-10-CM

## 2017-01-28 DIAGNOSIS — J9691 Respiratory failure, unspecified with hypoxia: Secondary | ICD-10-CM

## 2017-01-28 DIAGNOSIS — G43909 Migraine, unspecified, not intractable, without status migrainosus: Secondary | ICD-10-CM

## 2017-01-28 LAB — CBC
HEMATOCRIT: 42.6 % (ref 36.0–46.0)
HEMOGLOBIN: 13 g/dL (ref 12.0–15.0)
MCH: 23.9 pg — ABNORMAL LOW (ref 26.0–34.0)
MCHC: 30.5 g/dL (ref 30.0–36.0)
MCV: 78.3 fL (ref 78.0–100.0)
PLATELETS: 178 10*3/uL (ref 150–400)
RBC: 5.44 MIL/uL — AB (ref 3.87–5.11)
RDW: 18.9 % — ABNORMAL HIGH (ref 11.5–15.5)
WBC: 9.4 10*3/uL (ref 4.0–10.5)

## 2017-01-28 LAB — BASIC METABOLIC PANEL
Anion gap: 10 (ref 5–15)
BUN: 13 mg/dL (ref 6–20)
CALCIUM: 9.5 mg/dL (ref 8.9–10.3)
CO2: 28 mmol/L (ref 22–32)
Chloride: 104 mmol/L (ref 101–111)
Creatinine, Ser: 0.79 mg/dL (ref 0.44–1.00)
GFR calc Af Amer: >60 mL/min (ref >60)
GLUCOSE: 103 mg/dL — AB (ref 65–99)
Potassium: 4.2 mmol/L (ref 3.5–5.1)
Sodium: 142 mmol/L (ref 135–145)

## 2017-01-28 MED ORDER — HYDROCHLOROTHIAZIDE 25 MG PO TABS: 25.0000 mg | ORAL_TABLET | Freq: Every day | ORAL | 0 refills | Status: DC

## 2017-01-28 MED ORDER — FUROSEMIDE 10 MG/ML IJ SOLN
40.0000 mg | Freq: Once | INTRAMUSCULAR | Status: AC
  Administered 2017-01-28: 40 mg via INTRAVENOUS
  Filled 2017-01-28: qty 4

## 2017-01-28 MED ORDER — HYDROCHLOROTHIAZIDE 25 MG PO TABS: 25.0000 mg | ORAL_TABLET | Freq: Every day | ORAL | Status: DC

## 2017-01-28 MED ORDER — NICOTINE 14 MG/24HR TD PT24: 14.0000 mg | MEDICATED_PATCH | Freq: Every day | TRANSDERMAL | 0 refills | Status: DC

## 2017-01-28 NOTE — Progress Notes (Signed)
Patient states she is not ready for CPAP at this time. Patient states she will call when she gets ready. RT will continue to monitor.

## 2017-01-28 NOTE — Care Management Note (Addendum)
Case Management Note  Patient Details  Name: Teresa Roberts MRN: [id number] Date of Birth: 19-Mar-1961  Subjective/Objective:   Pt presented for SOB and NStemi. Pt is from home and she has 02 qhs that bleeds into CPAP. Pt gets her 02 via the [facility] is the agency of choice for the [place].                Action/Plan: CM did call the [facility] in regards to patient and DME needs. Awaiting call back. CM did speak with the clinic in regards to the DME needs. Clinic to call Teresa Roberts with the information and awaiting Teresa Roberts to call back to make her aware that pt needs continuous 02 for home.   Expected Discharge Date:  01/28/17               Expected Discharge Plan:  Home/Self Care  In-House Referral:  NA  Discharge planning Services  CM Consult  Post Acute Care Choice:  Durable Medical Equipment Choice offered to:  Patient  DME Arranged:  Oxygen (Has 02 via the [facility] for DME) DME Agency:  [facility]  HH Arranged:  NA [facility] Agency:  NA  Status of Service:  Completed, signed off  If discussed at [place] of Stay Meetings, dates discussed:    Additional Comments: 1545 01-28-17 Jacqlyn Krauss, RN,BSN [phone] CM was finally able to get [place] with the [facility]. Orders were faxed to [facility] Respiratory Therapy  Department. Teresa Roberts will contact [facility] for orders and fax them in order for 02 delivery tonight to patient's home. Pt left with portable tank for home. Pt has concentrator at home. No further needs from CM at this time.    1507 01-28-17 Jacqlyn Krauss, RN,BSN [phone] CM did call [facility] in [place] to see if they had fax number for RT [facility]-CM was able to fax orders, however awaiting on return phone call from [place]. [facility] could not send 02 unless had an official order from the [facility].  Bethena Roys, RN 01/28/2017, 1:34 PM

## 2017-01-28 NOTE — Discharge Instructions (Signed)
Acute Respiratory Failure, Adult °Acute respiratory failure occurs when there is not enough oxygen passing from your lungs to your body. When this happens, your lungs have trouble removing carbon dioxide from the blood. This causes your blood oxygen level to drop too low as carbon dioxide builds up. °Acute respiratory failure is a medical emergency. It can develop quickly, but it is temporary if treated promptly. Your lung capacity, or how much air your lungs can hold, may improve with time, exercise, and treatment. °What are the causes? °There are many possible causes of acute respiratory failure, including: °· Lung injury. °· Chest injury or damage to the ribs or tissues near the lungs. °· Lung conditions that affect the flow of air and blood into and out of the lungs, such as pneumonia, acute respiratory distress syndrome, and cystic fibrosis. °· Medical conditions, such as strokes or spinal cord injuries, that affect the muscles and nerves that control breathing. °· Blood infection (sepsis). °· Inflammation of the pancreas (pancreatitis). °· A blood clot in the lungs (pulmonary embolism). °· A large-volume blood transfusion. °· Burns. °· Near-drowning. °· Seizure. °· Smoke inhalation. °· Reaction to medicines. °· Alcohol or drug overdose. °What increases the risk? °This condition is more likely to develop in people who have: °· A blocked airway. °· Asthma. °· A condition or disease that damages or weakens the muscles, nerves, bones, or tissues that are involved in breathing. °· A serious infection. °· A health problem that blocks the unconscious reflex that is involved in breathing, such as hypothyroidism or sleep apnea. °· A lung injury or trauma. °What are the signs or symptoms? °Trouble breathing is the main symptom of acute respiratory failure. Symptoms may also include: °· Rapid breathing. °· Restlessness or anxiety. °· Skin, lips, or fingernails that appear blue (cyanosis). °· Rapid heart rate. °· Abnormal  heart rhythms (arrhythmias). °· Confusion or changes in behavior. °· Tiredness or loss of energy. °· Feeling sleepy or having a loss of consciousness. °How is this diagnosed? °Your health care provider can diagnose acute respiratory failure with a medical history and physical exam. During the exam, your health care provider will listen to your heart and check for crackling or wheezing sounds in your lungs. Your may also have tests to confirm the diagnosis and determine what is causing respiratory failure. These tests may include: °· Measuring the amount of oxygen in your blood (pulse oximetry). The measurement comes from a small device that is placed on your finger, earlobe, or toe. °· Other blood tests to measure blood gases and to look for signs of infection. °· Sampling your cerebral spinal fluid or tracheal fluid to check for infections. °· Chest X-ray to look for fluid in spaces that should be filled with air. °· Electrocardiogram (ECG) to look at the heart's electrical activity. °How is this treated? °Treatment for this condition usually takes places in a hospital intensive care unit (ICU). Treatment depends on what is causing the condition. It may include one or more treatments until your symptoms improve. Treatment may include: °· Supplemental oxygen. Extra oxygen is given through a tube in the nose, a face mask, or a hood. °· A device such as a continuous positive airway pressure (CPAP) or bi-level positive airway pressure (BiPAP or BPAP) machine. This treatment uses mild air pressure to keep the airways open. A mask or other device will be placed over your nose or mouth. A tube that is connected to a motor will deliver oxygen through the mask. °·   Ventilator. This treatment helps move air into and out of the lungs. This may be done with a bag and mask or a machine. For this treatment, a tube is placed in your windpipe (trachea) so air and oxygen can flow to the lungs.  Extracorporeal membrane oxygenation  (ECMO). This treatment temporarily takes over the function of the heart and lungs, supplying oxygen and removing carbon dioxide. ECMO gives the lungs a chance to recover. It may be used if a ventilator is not effective.  Tracheostomy. This is a procedure that creates a hole in the neck to insert a breathing tube.  Receiving fluids and medicines.  Rocking the bed to help breathing. Follow these instructions at home:  Take over-the-counter and prescription medicines only as told by your health care provider.  Return to normal activities as told by your health care provider. Ask your health care provider what activities are safe for you.  Keep all follow-up visits as told by your health care provider. This is important. How is this prevented? Treating infections and medical conditions that may lead to acute respiratory failure can help prevent the condition from developing. Contact a health care provider if:  You have a fever.  Your symptoms do not improve or they get worse. Get help right away if:  You are having trouble breathing.  You lose consciousness.  Your have cyanosis or turn blue.  You develop a rapid heart rate.  You are confused. These symptoms may represent a serious problem that is an emergency. Do not wait to see if the symptoms will go away. Get medical help right away. Call your local emergency services (911 in the [place] Do not drive yourself to the hospital. This information is not intended to replace advice given to you by your health care provider. Make sure you discuss any questions you have with your health care provider. Document Released: 11/06/2013 Document Revised: 05/29/2016 Document Reviewed: 05/19/2016 Elsevier Interactive Patient Education  2017 [place].     Hypoxemia Hypoxemia occurs when the blood does not contain enough oxygen. The body cannot work well when it does not have enough oxygen because every part of the body needs oxygen. Oxygen  enters the lungs when we breathe in, then it travels to all parts of the body through the blood. Hypoxemia can develop suddenly or slowly. What are the causes? Common causes of this condition include:  Long-term (chronic) lung diseases, such as chronic obstructive pulmonary disease (COPD) or interstitial lung disease.  Disorders that affect breathing at night, such as sleep apnea.  Fluid buildup in the lungs (pulmonary edema).  Lung infection (pneumonia).  Lung or throat cancer.  Abnormal blood flow that bypasses the lungs (having a shunt).  Certain diseasesthat affect nerves or muscles.  A collapsed lung (pneumothorax).  A blood clot in the lungs (pulmonary embolus).  Certain types of heart disease.  Slow or shallow breathing (hypoventilation).  Certain medicines.  High altitudes.  Toxic chemicals, smoke, and gases. What are the signs or symptoms? In some cases, there may be no symptoms of this condition. If you do have symptoms, they may include:  Shortness of breath (dyspnea).  Bluish color of the skin, lips, or nail beds.  Breathing that is fast, noisy, or shallow.  A fast heartbeat.  Feeling tired or sleepy.  Feeling confused or worried. If hypoxemia develops quickly, you will likely have dyspnea. If hypoxemia develops slowly over months or years, you may not notice any symptoms. How is this diagnosed?  This condition is diagnosed by:  A physical exam.  Blood tests.  A test that measures the percentage of oxygen in your blood (pulse oximetry). This is done with a sensor that is placed on your finger, toe, or earlobe. How is this treated? Treatment for this condition depends on the underlying cause of your hypoxemia. You will likely be treated with oxygen therapy to restore your blood oxygen level. Depending on the cause of your hypoxemia, you may need oxygen therapy for a short time (weeks or months), or you may need it for the rest of your life. Your  health care provider may also recommend other therapies to treat the underlying cause of your hypoxemia. Follow these instructions at home:  Take over-the-counter and prescription medicines only as told by your health care provider.  If you are on oxygen therapy, follow oxygen safety precautions as directed by your health care provider. These may include:  Always having a backup supply of oxygen.  Not allowing anyone to smoke or have a fire around oxygen.  Handling oxygen tanks carefully and as instructed.  Do not use any products that contain nicotine or tobacco, such as cigarettes and e-cigarettes. If you need help quitting, ask your health care provider. Stay away from people who smoke.  Keep all follow-up visits as told by your health care provider. This is important. Contact a health care provider if:  You have any concerns about your oxygen therapy.  You have trouble breathing, even during or after treatment.  You become short of breath when you exercise.  You are tired when you wake up.  You have a headache when you wake up. Get help right away if:  Your shortness of breath gets worse, especially with normal or minimal activity.  You have a bluish color of the skin, lips, or nail beds.  You become confused or you cannot think properly.  You cough up dark mucus or blood.  You have chest pain.  You have a fever. Summary  Hypoxemia occurs when the blood does not contain enough oxygen.  Hypoxemia may or may not cause symptoms. Often, the main symptom is shortness of breath (dyspnea).  Depending on the cause of your hypoxemia, you may need oxygen therapy for a short time (weeks or months), or you may need it for the rest of your life.  If you are on oxygen therapy, follow oxygen safety precautions as directed by your health care provider. This information is not intended to replace advice given to you by your health care provider. Make sure you discuss any questions  you have with your health care provider. Document Released: 05/17/2011 Document Revised: 10/05/2016 Document Reviewed: 10/05/2016 Elsevier Interactive Patient Education  2017 [place].   Migraine Headache A migraine headache is an intense, throbbing pain on one side or both sides of the head. Migraines may also cause other symptoms, such as nausea, vomiting, and sensitivity to light and noise. What are the causes? Doing or taking certain things may also trigger migraines, such as:  Alcohol.  Smoking.  Medicines, such as:  Medicine used to treat chest pain (nitroglycerine).  Birth control pills.  Estrogen pills.  Certain blood pressure medicines.  Aged cheeses, chocolate, or caffeine.  Foods or drinks that contain nitrates, glutamate, aspartame, or tyramine.  Physical activity. Other things that may trigger a migraine include:  Menstruation.  Pregnancy.  Hunger.  Stress, lack of sleep, too much sleep, or fatigue.  Weather changes. What increases the risk? The  following factors may make you more likely to experience migraine headaches:  Age. Risk increases with age.  Family history of migraine headaches.  Being Caucasian.  Depression and anxiety.  Obesity.  Being a woman.  Having a hole in the heart (patent foramen ovale) or other heart problems. What are the signs or symptoms? The main symptom of this condition is pulsating or throbbing pain. Pain may:  Happen in any area of the head, such as on one side or both sides.  Interfere with daily activities.  Get worse with physical activity.  Get worse with exposure to bright lights or loud noises. Other symptoms may include:  Nausea.  Vomiting.  Dizziness.  General sensitivity to bright lights, loud noises, or smells. Before you get a migraine, you may get warning signs that a migraine is developing (aura). An aura may include:  Seeing flashing lights or having blind spots.  Seeing bright  spots, halos, or zigzag lines.  Having tunnel vision or blurred vision.  Having numbness or a tingling feeling.  Having trouble talking.  Having muscle weakness. How is this diagnosed? A migraine headache can be diagnosed based on:  Your symptoms.  A physical exam.  Tests, such as CT scan or MRI of the head. These imaging tests can help rule out other causes of headaches.  Taking fluid from the spine (lumbar puncture) and analyzing it (cerebrospinal fluid analysis, or CSF analysis). How is this treated? A migraine headache is usually treated with medicines that:  Relieve pain.  Relieve nausea.  Prevent migraines from coming back. Treatment may also include:  Acupuncture.  Lifestyle changes like avoiding foods that trigger migraines. Follow these instructions at home: Medicines   Take over-the-counter and prescription medicines only as told by your health care provider.  Do not drive or use heavy machinery while taking prescription pain medicine.  To prevent or treat constipation while you are taking prescription pain medicine, your health care provider may recommend that you:  Drink enough fluid to keep your urine clear or pale yellow.  Take over-the-counter or prescription medicines.  Eat foods that are high in fiber, such as fresh fruits and vegetables, whole grains, and beans.  Limit foods that are high in fat and processed sugars, such as fried and sweet foods. Lifestyle   Avoid alcohol use.  Do not use any products that contain nicotine or tobacco, such as cigarettes and e-cigarettes. If you need help quitting, ask your health care provider.  Get at least 8 hours of sleep every night.  Limit your stress. General instructions    Keep a journal to find out what may trigger your migraine headaches. For example, write down:  What you eat and drink.  How much sleep you get.  Any change to your diet or medicines.  If you have a migraine:  Avoid  things that make your symptoms worse, such as bright lights.  It may help to lie down in a dark, quiet room.  Do not drive or use heavy machinery.  Ask your health care provider what activities are safe for you while you are experiencing symptoms.  Keep all follow-up visits as told by your health care provider. This is important. Contact a health care provider if:  You develop symptoms that are different or more severe than your usual migraine symptoms. Get help right away if:  Your migraine becomes severe.  You have a fever.  You have a stiff neck.  You have vision loss.  Your muscles  feel weak or like you cannot control them.  You start to lose your balance often.  You develop trouble walking.  You faint. This information is not intended to replace advice given to you by your health care provider. Make sure you discuss any questions you have with your health care provider. Document Released: 11/01/2005 Document Revised: 05/21/2016 Document Reviewed: 04/19/2016 Elsevier Interactive Patient Education  2017 [employer].

## 2017-01-28 NOTE — Progress Notes (Addendum)
Subjective: Currently, the patient is feeling well. No acute complaints, no SOB at rest, but did have desaturation to low 80s with ambulation. No HA or CP.  Interval Events: L/RHC w/o CAD. Mod PH.  Objective: Vital signs in last 24 hours: Vitals:   01/27/17 2007 01/27/17 2008 01/28/17 0500 01/28/17 0734  BP:  139/78 110/77 112/63  Pulse: (!) 57  88 87  Resp: 18  20 18   Temp: 98.4 F (36.9 C)  97.7 F (36.5 C) 98.2 F (36.8 C)  TempSrc: Oral  Oral Oral  SpO2: 92%  92% 90%  Weight:   188 lb 12.8 oz (85.6 kg)   Height:       Intake/Output:  03/15 0701 - 03/16 0700 In: 466.7 [P.O.:240; I.V.:226.7] Out: 1300 [Urine:1300]    Physical Exam: Physical Exam  Constitutional: She appears well-developed. She is cooperative. No distress.  Cardiovascular: Normal rate, regular rhythm, normal heart sounds and normal pulses.  Exam reveals no gallop.   No murmur heard. Pulmonary/Chest: Effort normal and breath sounds normal. No respiratory distress. Breasts are symmetrical.  Abdominal: Soft. Bowel sounds are normal. There is no tenderness.  Musculoskeletal: She exhibits no edema.   Labs: CBC:  Recent Labs Lab 01/26/17 0717 01/27/17 0246 01/28/17 0415  WBC 7.0 9.1 9.4  NEUTROABS 5.3  --   --   HGB 12.7 12.5 13.0  HCT 40.7 40.8 42.6  MCV 76.8* 76.8* 78.3  PLT PLATELETS APPEAR ADEQUATE 737 106   Metabolic Panel:  Recent Labs Lab 01/26/17 0855 01/26/17 1638 01/27/17 0827 01/28/17 0415  NA 140  --  140 142  K 4.8  --  4.1 4.2  CL 109  --  106 104  CO2 24  --  25 28  GLUCOSE 100*  --  115* 103*  BUN 9  --  13 13  CREATININE 0.79  --  0.83 0.79  CALCIUM 9.5  --  9.6 9.5  LABPROT  --  14.4  --   --   INR  --  1.11  --   --    Cardiac Labs:  Recent Labs Lab 01/26/17 0717 01/26/17 0740 01/26/17 1320 01/26/17 1851  TROPIPOC  --  0.09*  --   --   TROPONINI  --   --  0.12* 0.13*  BNP 224.3*  --   --   --     Medications: Infusions:  Scheduled Medications: .  atorvastatin  40 mg Oral QHS  . busPIRone  5 mg Oral BID  . carvedilol  6.25 mg Oral BID WC  . docusate sodium  100 mg Oral TID  . DULoxetine  60 mg Oral Daily  . enoxaparin (LOVENOX) injection  40 mg Subcutaneous Q24H  . gabapentin  800 mg Oral BID  . mirtazapine  15 mg Oral QHS  . nicotine  14 mg Transdermal Daily  . pantoprazole  40 mg Oral Daily  . sodium chloride flush  3 mL Intravenous Q12H  . sodium chloride flush  3 mL Intravenous Q12H  . vitamin B-12  100 mcg Oral Daily   PRN Medications: sodium chloride, acetaminophen, aspirin-acetaminophen-caffeine, diazepam, guaiFENesin, ipratropium-albuterol, ketorolac, nitroGLYCERIN, ondansetron (ZOFRAN) IV, sodium chloride flush, traZODone  Assessment/Plan: Pt is a 56 y.o. yo female with a PMHx of OSA, HTN, Tobacco abuse, and Migraines who was admitted on 01/26/2017 with symptoms of hypoxic resp failure and atypical CP after presentation to the ED for HA.  Hypoxic Respiratory Failure: Patient presenting with 1 week hx SOB found  to be hypoxic in the 60's on admission. She has desatted down to 58% on room air with ambulation. ABG consistent with hypoxia with PO2 62. CXR shows mild CHF changes but echo demonstrates G2DD w/ pEF. L/RHC w/o CAD, mod pulm HTN. DDx included PE but CTA was neg. Type 2 NSTEMI w/ elevated trop to 0.13 and sudden onset chest pain in the ED with persistent inferolateral T wave inversions on EKG. May benefit from diuresis. Should have PFTs outpt to w/u primary lung disease. - discontinue heparin gtt - 85m IV Lasix once - add HCTZ for BP and mild diuresis - Oxygen as needed, aim for sats in 88-92% range - outpt PFts  Type 2 NSTEMI: Atypical CP now resolved w/ elevated trop to 0.13 and new TWI. Echo w/o wall motion abnl. L/RHC w/o CAD. - Coreg 6.25 mg BID - add HCTZ for BP - Atorvastatin 40 mg daily - Nitroglycerin PRN - Will need to d/c triptans 2/2 cardiovascular risk  HTN: BP stable in [id number] on  admission. No normotensive on Coreg. She was on Amlodipine 5 mg daily at home. - switch home med from Amlodipine to HCTZ for diuretic affect - Coreg as above  OSA on CPAP: Uses CPAP nightly.  - Continue CPAP   Tobacco abuse: Currently smokes 1 ppd and has been smoking for last 30+ years.Will provide nicotine patch.  Hx IgA MGUS: Initially diagnosed in 2013. She last saw Heme/Onc, Dr. CJuliann Mule in July 2015. Multiple myeloma and smoldering MM were excluded by a normal UPEP with IFE, kappa / lambda light chains, and 24 hour total protein levels. Skeletal survey in June 2015 negatve for any bone lesions. She has not had any additional follow up. Her calcium and renal function are within normal limits. - Outpatient Heme/Onc follow up  Fibromyalgia: Continue home Cymbalta [id number]  Migraines: Now resolved. Would avoid triptans in the future given her evidence of cardiac ischemia this admission. - Use Tylenol, anti-emetics if needed  Length of Stay: 2 day(s) Dispo: Anticipated discharge today to outpt f/u.  BrHolley RaringMD Pager: [phone]-5PM) 01/28/2017, 8:25 AM

## 2017-01-28 NOTE — Plan of Care (Signed)
Problem: Activity: Goal: Ability to tolerate increased activity will improve Outcome: Completed/Met Date Met: 01/28/17 Ambulating independently in room, requiring oxygen with ambulation.  Case Manager and MD made aware.  Home oxygen ordered, awaiting delivery.

## 2017-01-28 NOTE — H&P (Deleted)
Name: Teresa Roberts MRN: [id number] DOB: [birth year] 56 y.o. PCP: Jonathon Jordan, MD  Date of Admission: 01/26/2017  6:45 AM Date of Discharge: 01/28/2017 Attending Physician: Truman Hayward, MD  Discharge Diagnosis: Active Problems:   Non-ST elevation myocardial infarction (NSTEMI), type 2 ([place])   Respiratory failure with hypoxia ([place])   Migraine   Discharge Medications: Allergies as of 01/28/2017      Reactions   Citrus Hives   Lisinopril Cough      Medication List    STOP taking these medications   amLODipine 5 MG tablet Commonly known as:  NORVASC   guaiFENesin 600 MG 12 hr tablet Commonly known as:  MUCINEX   nicotine 7 mg/24hr patch Commonly known as:  NICODERM CQ - dosed in mg/24 hr Replaced by:  nicotine 14 mg/24hr patch   rizatriptan 10 MG disintegrating tablet Commonly known as:  MAXALT-MLT     TAKE these medications   Acidophilus Lactobacillus Caps Take 2 capsules by mouth every morning.   Biotin 1 MG Caps Take 1 mg by mouth daily.   busPIRone 10 MG tablet Commonly known as:  BUSPAR Take 5 mg by mouth 2 (two) times daily.   docusate sodium 100 MG capsule Commonly known as:  COLACE Take 100 mg by mouth 3 (three) times daily.   DULoxetine 30 MG capsule Commonly known as:  CYMBALTA TAKE ONE CAPSULE BY MOUTH EVERY DAY What changed:  See the new instructions.   gabapentin 300 MG capsule Commonly known as:  NEURONTIN Take 800 mg by mouth 2 (two) times daily.   hydrochlorothiazide 25 MG tablet Commonly known as:  HYDRODIURIL Take 1 tablet (25 mg total) by mouth daily. Start taking on:  01/29/2017   mesalamine 1000 MG suppository Commonly known as:  CANASA Place 1,000 mg rectally at bedtime.   mirtazapine 15 MG tablet Commonly known as:  REMERON Take 15 mg by mouth at bedtime.   nicotine 14 mg/24hr patch Commonly known as:  NICODERM CQ - dosed in mg/24 hours Place 1 patch (14 mg total) onto the skin daily. Start taking on:   01/29/2017 Replaces:  nicotine 7 mg/24hr patch   omeprazole 20 MG tablet Commonly known as:  PRILOSEC OTC Take 40 mg by mouth daily.   prazosin 2 MG capsule Commonly known as:  MINIPRESS Take 2 mg by mouth at bedtime.   simvastatin 10 MG tablet Commonly known as:  ZOCOR Take 10 mg by mouth at bedtime.   traZODone 100 MG tablet Commonly known as:  DESYREL Take 100 mg by mouth at bedtime as needed for sleep.   vitamin B-12 100 MCG tablet Commonly known as:  CYANOCOBALAMIN Take 100 mcg by mouth daily.            Durable Medical Equipment        Start     Ordered   01/28/17 1220  DME Oxygen  Once    Comments:  Use for while active.  Question Answer Comment  Mode or (Route) Nasal cannula   Liters per Minute 2   Frequency Continuous (stationary and portable oxygen unit needed)   Oxygen delivery system Gas      01/28/17 1224      Disposition and follow-up:   Teresa Roberts was discharged from [facility] in Stable condition.  At the hospital follow up visit please address:  1.  Resp Failure and Hypoxia: No obvious cause identified but PE, ACS, and HF are r/o. Concern for primary  PAH. Would recommend outpt Pulm referral and PFTs. Reassess O2 requirement.  2.  Labs / imaging needed at time of follow-up: PFTs, Amb pulse-ox  Follow-up Appointments: Follow-up Information    Lilian Coma, MD. Go on 02/04/2017.   Specialty:  Family Medicine Why:  Appt at 11:00am for hospital follow up. Contact information: [address] [place] [address] [place] Hospital Course by problem list: Active Problems:   Non-ST elevation myocardial infarction (NSTEMI), type 2 ([facility])   Respiratory failure with hypoxia ([facility])   Migraine   1. Acute resp failure with hypoxia and type 2 NSTEMI: Patient initially presented to ED with complaint of headache, while in the ED she was walking to the bathroom and had a acute onset of  hypoxia down to 58%. She had atypical chest pain at that time EKG showed T-wave inversions which were new and troponin trend showed elevation of 0.13. Cardiology was consulted she received an echocardiogram which showed grade 2 diastolic dysfunction but no significant reduced EF and no wall motion abnormalities. She received a CTA for concern of PE which was negative for any embolism or parenchymal lung disease. Cardiology decided to pursue a left and right heart cath for full investigation. Catheterization revealed no significant coronary artery disease with moderate pulmonary hypertension. She received several doses of IV Lasix for concern of pulmonary edema which had mild success but did not relieve her oxygen requirement. Cardiology has recommended that we transition her antihypertensive therapy from amlodipine to HCTZ to help with any hypervolemia in the future. Patient's chest pain resolved spontaneously. Ambulatory oxygen saturations revealed 83% on room air which resolved to 95% with 2 L. Resting saturations are 97% on room air are best addition to date has not demonstrated any obvious reasons for her current oxygen requirement for her acute respiratory failure however we have ruled out PE, ACS, and significant parenchymal lung disease. There is still some concern for primary pulmonary HTN or functional lung disease which warrants PFT testing and would likely benefit from follow up with Pulmonology. She will be discharged with home oxygen and PCP follow up who should be able to facilitate PFTs and Pulm appointments.  2. Migraines: Patient initially presented to the ED with complaint of headache that was similar to her previous migraines presented outside her home medications including triptan. She received a headache cocktail in the ED prior was suffering her acute respiratory failure and chest pain. Since her initial presentation her headache has been completely resolved and she is asymptomatic today.  The patient we would recommend that she avoid triptan's in the setting of her and STEMI as these can have some cardiovascular complications.  Discharge Vitals:   BP 112/63 (BP Location: Right Arm)   Pulse 87   Temp 98.2 F (36.8 C) (Oral)   Resp 18   Ht 5\' 4"  (1.626 m)   Wt 188 lb 12.8 oz (85.6 kg)   LMP 08/19/2012 Comment: reviewed by Baxter Flattery Dingus RT-T  SpO2 90%   BMI 32.41 kg/m   Pertinent Labs, Studies, and Procedures: As above.  Procedures Performed:  Dg Chest 2 View Result Date: 01/26/2017 IMPRESSION: Changes of mild CHF.   Ct Angio Chest Pe W Or Wo Contrast Result Date: 01/26/2017 IMPRESSION: 1. Negative for acute pulmonary embolus 2. Cardiomegaly. Mild reflux of contrast into the hepatic veins suggests elevated right heart pressure. 3. Clear lung fields.  2D Echo:  Study Conclusions - Left  ventricle: The cavity size was normal. Wall thickness was   normal. Systolic function was normal. The estimated ejection   fraction was in the range of 60% to 65%. Wall motion was normal;   there were no regional wall motion abnormalities. Features are   consistent with a pseudonormal left ventricular filling pattern,   with concomitant abnormal relaxation and increased filling   pressure (grade 2 diastolic dysfunction). - Aortic valve: Mildly calcified annulus. Valve area (Vmax): 1.94   cm^2. Valve area (Vmean): 2.16 cm^2. - Right atrium: The atrium was mildly dilated.  Cardiac Cath:   Mid RCA lesion, 20 %stenosed.  The left ventricular ejection fraction is 55-65% by visual estimate.  The left ventricular systolic function is normal.  LV end diastolic pressure is normal.   Moderate right sided heart pressure elevation with moderate pulmonary hypertension. Normal systolic function with moderate left ventricular hypertrophy with EF estimate of approximately 60%. 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  Consultations: Treatment Team:  Rounding Lbcardiology, MD  Discharge Instructions: Discharge Instructions    Call MD for:  difficulty breathing, headache or visual disturbances    Complete by:  As directed    Call MD for:  extreme fatigue    Complete by:  As directed    Call MD for:  persistant dizziness or light-headedness    Complete by:  As directed    Diet - low sodium heart healthy    Complete by:  As directed    Discharge instructions    Complete by:  As directed    Please follow up with your primary doctor to continue evaluation of your breathing. We have prescribed oxygen for you to use at home when you are active. You do not need to use this while resting. Your primary doctor can continue to schedule further testing for your shortness of breath.   Increase activity slowly    Complete by:  As directed       Signed: Holley Raring, MD 01/28/2017, 12:24 PM   Pager: [phone]

## 2017-01-28 NOTE — Progress Notes (Signed)
SATURATION QUALIFICATIONS: (This note is used to comply with regulatory documentation for home oxygen)  Patient Saturations on Room Air at Rest = 97%  Patient Saturations on Room Air while Ambulating = 83%  Patient Saturations on 2 Liters of oxygen while Ambulating = 95%  Please briefly explain why patient needs home oxygen:  Teresa Roberts

## 2017-01-28 NOTE — Progress Notes (Signed)
Reviewed discharge instructions with patient and she stated her understanding.  Reviewed heart failure instructions concerning daily weights, low sodium diet, and when to notify the physician with patient and family as well.  Patient awaiting home oxygen delivery for discharge home.  Teresa Roberts

## 2017-01-28 NOTE — Progress Notes (Signed)
Progress Note  Patient Name: Teresa Roberts Date of Encounter: 01/28/2017  Primary Cardiologist: New- Dr. Johnsie Cancel  Subjective   Feels well wants to go home   Inpatient Medications    Scheduled Meds: . atorvastatin  40 mg Oral QHS  . busPIRone  5 mg Oral BID  . carvedilol  6.25 mg Oral BID WC  . docusate sodium  100 mg Oral TID  . DULoxetine  60 mg Oral Daily  . enoxaparin (LOVENOX) injection  40 mg Subcutaneous Q24H  . gabapentin  800 mg Oral BID  . mirtazapine  15 mg Oral QHS  . nicotine  14 mg Transdermal Daily  . pantoprazole  40 mg Oral Daily  . sodium chloride flush  3 mL Intravenous Q12H  . sodium chloride flush  3 mL Intravenous Q12H  . vitamin B-12  100 mcg Oral Daily   Continuous Infusions:  PRN Meds: sodium chloride, acetaminophen, aspirin-acetaminophen-caffeine, diazepam, guaiFENesin, ipratropium-albuterol, ketorolac, nitroGLYCERIN, ondansetron (ZOFRAN) IV, sodium chloride flush, traZODone   Vital Signs    Vitals:   01/27/17 2007 01/27/17 2008 01/28/17 0500 01/28/17 0734  BP:  139/78 110/77 112/63  Pulse: (!) 57  88 87  Resp: 18  20 18   Temp: 98.4 F (36.9 C)  97.7 F (36.5 C) 98.2 F (36.8 C)  TempSrc: Oral  Oral Oral  SpO2: 92%  92% 90%  Weight:   188 lb 12.8 oz (85.6 kg)   Height:        Intake/Output Summary (Last 24 hours) at 01/28/17 0811 Last data filed at 01/28/17 0700  Gross per 24 hour  Intake           466.67 ml  Output             1300 ml  Net          -833.33 ml   Filed Weights   01/26/17 1545 01/27/17 0607 01/28/17 0500  Weight: 188 lb 3.2 oz (85.4 kg) 187 lb 8 oz (85 kg) 188 lb 12.8 oz (85.6 kg)    Telemetry    NSR 60's-70's - Personally Reviewed  ECG    No new tracings. EKG on 3/14 showed sinus rhythm at 70 bpm with diffuse T-wave inversions, late R-wave progression, and probable left atrial enlargement  Physical Exam  BP 112/63 (BP Location: Right Arm)   Pulse 87   Temp 98.2 F (36.8 C) (Oral)   Resp 18   Ht 5\' 4"   (1.626 m)   Wt 188 lb 12.8 oz (85.6 kg)   LMP 08/19/2012 Comment: reviewed by Baxter Flattery Dingus RT-T  SpO2 90%   BMI 32.41 kg/m   GEN: Obese black female   Neck: No JVD Cardiac: RRR, no murmurs, rubs, or gallops.  Respiratory: Clear to auscultation bilaterally. GI: Soft, nontender, non-distended  MS: No edema; No deformity. Neuro:  Nonfocal  Psych: Normal affect  Right femoral cath sight A no hematoma   Labs    Chemistry  Recent Labs Lab 01/26/17 0855 01/27/17 0827 01/28/17 0415  NA 140 140 142  K 4.8 4.1 4.2  CL 109 106 104  CO2 24 25 28   GLUCOSE 100* 115* 103*  BUN 9 13 13   CREATININE 0.79 0.83 0.79  CALCIUM 9.5 9.6 9.5  GFRNONAA >60 >60 >60  GFRAA >60 >60 >60  ANIONGAP 7 9 10      Hematology  Recent Labs Lab 01/26/17 0717 01/27/17 0246 01/28/17 0415  WBC 7.0 9.1 9.4  RBC 5.30* 5.31* 5.44*  HGB  12.7 12.5 13.0  HCT 40.7 40.8 42.6  MCV 76.8* 76.8* 78.3  MCH 24.0* 23.5* 23.9*  MCHC 31.2 30.6 30.5  RDW 20.3* 18.3* 18.9*  PLT PLATELETS APPEAR ADEQUATE 167 178    Cardiac Enzymes  Recent Labs Lab 01/26/17 1320 01/26/17 1851  TROPONINI 0.12* 0.13*     Recent Labs Lab 01/26/17 0740  TROPIPOC 0.09*     BNP  Recent Labs Lab 01/26/17 0717  BNP 224.3*     DDimer No results for input(s): DDIMER in the last 168 hours.   Radiology    Dg Chest 2 View  Result Date: 01/26/2017 CLINICAL DATA:  Shortness of breath for several days EXAM: CHEST  2 VIEW COMPARISON:  None. FINDINGS: Cardiac shadow is at the upper limits of normal in size. Mild central vascular congestion is noted with very minimal interstitial edema. No sizable effusion or focal infiltrate is noted. No bony abnormality is seen. IMPRESSION: Changes of mild CHF. Electronically Signed   By: Inez Catalina M.D.   On: 01/26/2017 08:22   Ct Angio Chest Pe W Or Wo Contrast  Result Date: 01/26/2017 CLINICAL DATA:  Hypoxia EXAM: CT ANGIOGRAPHY CHEST WITH CONTRAST TECHNIQUE: Multidetector CT imaging of  the chest was performed using the standard protocol during bolus administration of intravenous contrast. Multiplanar CT image reconstructions and MIPs were obtained to evaluate the vascular anatomy. CONTRAST:  62 mL Isovue 370 intravenous COMPARISON:  Chest x-ray 01/26/2017 FINDINGS: Cardiovascular: Satisfactory opacification of the pulmonary arteries to the segmental level. No evidence of pulmonary embolism. Non aneurysmal aorta. There is cardiomegaly. Minimal coronary artery calcification. No large pericardial effusion. Mediastinum/Nodes: Nonspecific mediastinal lymph nodes. Midline trachea. No thyroid mass. Esophagus within normal limits. Lungs/Pleura: Lungs are clear. No pleural effusion or pneumothorax. Upper Abdomen: Mild reflux of contrast into the hepatic veins. No acute abnormality. Musculoskeletal: No chest wall abnormality. No acute or significant osseous findings. Mild degenerative changes. Review of the MIP images confirms the above findings. IMPRESSION: 1. Negative for acute pulmonary embolus 2. Cardiomegaly. Mild reflux of contrast into the hepatic veins suggests elevated right heart pressure. 3. Clear lung fields. Electronically Signed   By: Donavan Foil M.D.   On: 01/26/2017 23:15    Cardiac Studies   Echo 01/26/17 Study Conclusions  - Left ventricle: The cavity size was normal. Wall thickness was   normal. Systolic function was normal. The estimated ejection   fraction was in the range of 60% to 65%. Wall motion was normal;   there were no regional wall motion abnormalities. Features are   consistent with a pseudonormal left ventricular filling pattern,   with concomitant abnormal relaxation and increased filling   pressure (grade 2 diastolic dysfunction). - Aortic valve: Mildly calcified annulus. Valve area (Vmax): 1.94   cm^2. Valve area (Vmean): 2.16 cm^2. - Right atrium: The atrium was mildly dilated.  Patient Profile     56 y.o. female with past medical history  significant for Hypertension, hyperlipidemia, Fibromyalgia, COPD, OSA and CPAP with oxygen at night, chronic bronchitis, half pack per day smoker, IgA MGUS and daily headaches presented to the ED on 3/14 with intractable headache and increased shortness of breath. On arrival her oxygen saturation was in the 60s and she was placed on 4 L of oxygen. Her headache has now been controlled, however, she did develop chest pain. She has no cardiac history.   Assessment & Plan    1. Atypical Chest pain -New-onset chest pain during evaluation for headache and shortness of  breath, now nearly resolved. Central chest pressure, nonradiating, not associated with deep breathing or positions. -Patient has also experienced progressive dyspnea on exertion over the last month -CVD risk factors include hypertension, hyperlipidemia, half pack per day smoker, and obesity -Troponin is mildly elevated at 0.09, 0.12, 0.13 -EKG is with diffuse T-wave inversions -BNP is mildly elevated at 224.3, no clinical signs of volume overload. Carvedilol has been initiated and Lasix 40 mg IV has been given with improvement in breathing -echocardiogram showed normal LV systolic function, no RWMA, grade 2 DD -Heparin infusing -With elevation in troponin, increased dyspnea on exertion over the past month, and EKG changes patient should have further cardiac testing for definitive evaluation -Plan for right and left heart cath today.  SCr is 0.79. She is scheduled for 12:00 today with Dr. Claiborne Billings.  2. Diastolic dysfunction -Pt has had shortness of breath over the last month. -BNP 224 on admission. Responded well to lasix with improvement in her shortness of breath - would d/c with HCTZ 12.5 mg daily   3. Hypoxemia -Patient is a long time smoker and has a history of COPD requiring CPAP with oxygen at night. She usually does not use oxygen during the day -Supplemental oxygen and continue to seek out pulmonary causes   4.  Hypertension -Prazosin is her home med. Diuretic added for diastolic issues and elevated EDP not sure she needs norvasc   5. hyperlipidemia -Followed by her PCP.  -Patient is on simvastatin at home. Atorvastatin 40 mg has been initiated here -Lipid panel: LDL 66, well controlled  6. Tobacco use -1/2 PPD smoker, advise cessation  7. Obstructive sleep apnea -Continue home CPAP with oxygen  Ok to d/c home no need for cardiology f/u   Jenkins Rouge

## 2017-01-28 NOTE — Discharge Summary (Signed)
Name: Teresa Roberts MRN: [id number] DOB: 1960/11/17 56 y.o. PCP: Teresa Jordan, Roberts  Date of Admission: 01/26/2017  6:45 AM Date of Discharge: 01/28/2017 Attending Physician: Teresa Hayward, Roberts  Discharge Diagnosis: Active Problems:   Non-ST elevation myocardial infarction (NSTEMI), type 2 ([place])   Respiratory failure with hypoxia ([place])   Migraine   Discharge Medications: Allergies as of 01/28/2017      Reactions   Citrus Hives   Lisinopril Cough      Medication List    STOP taking these medications   amLODipine 5 MG tablet Commonly known as:  NORVASC   guaiFENesin 600 MG 12 hr tablet Commonly known as:  MUCINEX   nicotine 7 mg/24hr patch Commonly known as:  NICODERM CQ - dosed in mg/24 hr Replaced by:  nicotine 14 mg/24hr patch   rizatriptan 10 MG disintegrating tablet Commonly known as:  MAXALT-MLT     TAKE these medications   Acidophilus Lactobacillus Caps Take 2 capsules by mouth every morning.   Biotin 1 MG Caps Take 1 mg by mouth daily.   busPIRone 10 MG tablet Commonly known as:  BUSPAR Take 5 mg by mouth 2 (two) times daily.   docusate sodium 100 MG capsule Commonly known as:  COLACE Take 100 mg by mouth 3 (three) times daily.   DULoxetine 30 MG capsule Commonly known as:  CYMBALTA TAKE ONE CAPSULE BY MOUTH EVERY DAY What changed:  See the new instructions.   gabapentin 300 MG capsule Commonly known as:  NEURONTIN Take 800 mg by mouth 2 (two) times daily.   hydrochlorothiazide 25 MG tablet Commonly known as:  HYDRODIURIL Take 1 tablet (25 mg total) by mouth daily. Start taking on:  01/29/2017   mesalamine 1000 MG suppository Commonly known as:  CANASA Place 1,000 mg rectally at bedtime.   mirtazapine 15 MG tablet Commonly known as:  REMERON Take 15 mg by mouth at bedtime.   nicotine 14 mg/24hr patch Commonly known as:  NICODERM CQ - dosed in mg/24 hours Place 1 patch (14 mg total) onto the skin daily. Start taking on:   01/29/2017 Replaces:  nicotine 7 mg/24hr patch   omeprazole 20 MG tablet Commonly known as:  PRILOSEC OTC Take 40 mg by mouth daily.   prazosin 2 MG capsule Commonly known as:  MINIPRESS Take 2 mg by mouth at bedtime.   simvastatin 10 MG tablet Commonly known as:  ZOCOR Take 10 mg by mouth at bedtime.   traZODone 100 MG tablet Commonly known as:  DESYREL Take 100 mg by mouth at bedtime as needed for sleep.   vitamin B-12 100 MCG tablet Commonly known as:  CYANOCOBALAMIN Take 100 mcg by mouth daily.            Durable Medical Equipment        Start     Ordered   01/28/17 1220  DME Oxygen  Once    Comments:  Use for while active.  Question Answer Comment  Mode or (Route) Nasal cannula   Liters per Minute 2   Frequency Continuous (stationary and portable oxygen unit needed)   Oxygen delivery system Gas      01/28/17 1224      Disposition and follow-up:   Ms.Teresa Roberts was discharged from [facility] in Stable condition.  At the hospital follow up visit please address:  1.  Resp Failure and Hypoxia: No obvious cause identified but PE, ACS, and HF are r/o. Concern for primary  PAH. Would recommend outpt Pulm referral and PFTs. Reassess O2 requirement.  2.  Labs / imaging needed at time of follow-up: PFTs, Amb pulse-ox  Follow-up Appointments: Follow-up Information    Teresa Roberts. Go on 02/04/2017.   Specialty:  Family Medicine Why:  Appt at 11:00am for hospital follow up. Contact information: Teresa Roberts [id number] [address] [place] Hospital Course by problem list: Active Problems:   Non-ST elevation myocardial infarction (NSTEMI), type 2 ([facility])   Respiratory failure with hypoxia ([facility])   Migraine   1. Acute resp failure with hypoxia and type 2 NSTEMI: Patient initially presented to ED with complaint of headache, while in the ED she was walking to the bathroom and had a acute onset of  hypoxia down to 58%. She had atypical chest pain at that time EKG showed T-wave inversions which were new and troponin trend showed elevation of 0.13. Cardiology was consulted she received an echocardiogram which showed grade 2 diastolic dysfunction but no significant reduced EF and no wall motion abnormalities. She received a CTA for concern of PE which was negative for any embolism or parenchymal lung disease. Cardiology decided to pursue a left and right heart cath for full investigation. Catheterization revealed no significant coronary artery disease with moderate pulmonary hypertension. She received several doses of IV Lasix for concern of pulmonary edema which had mild success but did not relieve her oxygen requirement. Cardiology has recommended that we transition her antihypertensive therapy from amlodipine to HCTZ to help with any hypervolemia in the future. Patient's chest pain resolved spontaneously. Ambulatory oxygen saturations revealed 83% on room air which resolved to 95% with 2 L. Resting saturations are 97% on room air are best addition to date has not demonstrated any obvious reasons for her current oxygen requirement for her acute respiratory failure however we have ruled out PE, ACS, and significant parenchymal lung disease. There is still some concern for primary pulmonary HTN or functional lung disease which warrants PFT testing and would likely benefit from follow up with Pulmonology. She will be discharged with home oxygen and PCP follow up who should be able to facilitate PFTs and Pulm appointments.  2. Migraines: Patient initially presented to the ED with complaint of headache that was similar to her previous migraines presented outside her home medications including triptan. She received a headache cocktail in the ED prior was suffering her acute respiratory failure and chest pain. Since her initial presentation her headache has been completely resolved and she is asymptomatic today.  The patient we would recommend that she avoid triptan's in the setting of her and STEMI as these can have some cardiovascular complications.  Discharge Vitals:   BP 112/63 (BP Location: Right Arm)   Pulse 87   Temp 98.2 F (36.8 C) (Oral)   Resp 18   Ht 5\' 4"  (1.626 m)   Wt 188 lb 12.8 oz (85.6 kg)   LMP 08/19/2012 Comment: reviewed by Baxter Flattery Dingus RT-T  SpO2 90%   BMI 32.41 kg/m   Pertinent Labs, Studies, and Procedures: As above.  Procedures Performed:  Dg Chest 2 View Result Date: 01/26/2017 IMPRESSION: Changes of mild CHF.   Ct Angio Chest Pe W Or Wo Contrast Result Date: 01/26/2017 IMPRESSION: 1. Negative for acute pulmonary embolus 2. Cardiomegaly. Mild reflux of contrast into the hepatic veins suggests elevated right heart pressure. 3. Clear lung fields.  2D Echo:  Study Conclusions - Left  ventricle: The cavity size was normal. Wall thickness was   normal. Systolic function was normal. The estimated ejection   fraction was in the range of 60% to 65%. Wall motion was normal;   there were no regional wall motion abnormalities. Features are   consistent with a pseudonormal left ventricular filling pattern,   with concomitant abnormal relaxation and increased filling   pressure (grade 2 diastolic dysfunction). - Aortic valve: Mildly calcified annulus. Valve area (Vmax): 1.94   cm^2. Valve area (Vmean): 2.16 cm^2. - Right atrium: The atrium was mildly dilated.  Cardiac Cath:   Mid RCA lesion, 20 %stenosed.  The left ventricular ejection fraction is 55-65% by visual estimate.  The left ventricular systolic function is normal.  LV end diastolic pressure is normal.   Moderate right sided heart pressure elevation with moderate pulmonary hypertension. Normal systolic function with moderate left ventricular hypertrophy with EF estimate of approximately 60%. 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  Consultations: Treatment Team:  [facility], Roberts  Discharge Instructions: Discharge Instructions    Call Roberts for:  difficulty breathing, headache or visual disturbances    Complete by:  As directed    Call Roberts for:  extreme fatigue    Complete by:  As directed    Call Roberts for:  persistant dizziness or light-headedness    Complete by:  As directed    Diet - low sodium heart healthy    Complete by:  As directed    Discharge instructions    Complete by:  As directed    Please follow up with your primary doctor to continue evaluation of your breathing. We have prescribed oxygen for you to use at home when you are active. You do not need to use this while resting. Your primary doctor can continue to schedule further testing for your shortness of breath.   Increase activity slowly    Complete by:  As directed       Signed: Holley Raring, Roberts 01/28/2017, 1:15 PM   Pager: [phone]

## 2017-01-31 ENCOUNTER — Ambulatory Visit (INDEPENDENT_AMBULATORY_CARE_PROVIDER_SITE_OTHER): Payer: Commercial Managed Care - HMO | Admitting: Rheumatology

## 2017-01-31 ENCOUNTER — Encounter: Payer: Self-pay | Admitting: Rheumatology

## 2017-01-31 VITALS — BP 136/74 | HR 80 | Resp 14 | Ht 64.0 in | Wt 186.0 lb

## 2017-01-31 DIAGNOSIS — F5101 Primary insomnia: Secondary | ICD-10-CM

## 2017-01-31 DIAGNOSIS — M797 Fibromyalgia: Secondary | ICD-10-CM

## 2017-01-31 DIAGNOSIS — M62838 Other muscle spasm: Secondary | ICD-10-CM

## 2017-01-31 DIAGNOSIS — R5382 Chronic fatigue, unspecified: Secondary | ICD-10-CM

## 2017-01-31 MED ORDER — LIDOCAINE HCL 1 % IJ SOLN
0.3000 mL | INTRAMUSCULAR | Status: AC | PRN
  Administered 2017-01-31: .3 mL

## 2017-01-31 MED ORDER — TRIAMCINOLONE ACETONIDE 40 MG/ML IJ SUSP
10.0000 mg | INTRAMUSCULAR | Status: AC | PRN
  Administered 2017-01-31: 10 mg via INTRAMUSCULAR

## 2017-01-31 MED ORDER — METHOCARBAMOL 500 MG PO TABS: 500.0000 mg | ORAL_TABLET | Freq: Three times a day (TID) | ORAL | 2 refills | Status: DC

## 2017-01-31 NOTE — Progress Notes (Signed)
Office Visit Note  Patient: Teresa Roberts             Date of Birth: 1960/12/09           MRN: [id number]             PCP: Lilian Coma, MD Referring: Jonathon Jordan, MD Visit Date: 01/31/2017 Occupation: @GUAROCC @    Subjective:  Follow-up   History of Present Illness: Teresa Roberts is a 56 y.o. female   Last seen in our office 06/22/2016.  Patient recently went to the emergency room secondary to migraines. Patient states that they couldn't figure out why my oxygen saturation was dropping and therefore worked me up for that instead of the migraines.  They're going to be sending me to my PCP for further evaluation and treatment and set me up with a pulmonologist.  Please see report for full details from 01/26/2017.  Patient's fibromyalgia is doing "bad".  Patient has a history of disability (partial) and wanted to be converted to full disability by Dr. Estanislado Pandy.  Activities of Daily Living:  Patient reports morning stiffness for 60 minutes.   Patient Reports nocturnal pain.  Difficulty dressing/grooming: Reports Difficulty climbing stairs: Reports Difficulty getting out of chair: Reports Difficulty using hands for taps, buttons, cutlery, and/or writing: Reports   Review of Systems  Constitutional: Positive for fatigue.  HENT: Negative for mouth sores and mouth dryness.   Eyes: Negative for dryness.  Respiratory: Negative for shortness of breath.   Gastrointestinal: Negative for constipation and diarrhea.  Musculoskeletal: Positive for myalgias and myalgias.  Skin: Negative for sensitivity to sunlight.  Psychiatric/Behavioral: Positive for sleep disturbance. Negative for decreased concentration.    PMFS History:  Patient Active Problem List   Diagnosis Date Noted  . Respiratory failure with hypoxia ([place]) 01/28/2017  . Migraine 01/28/2017  . Daily headache   . Hypoxia   . Shortness of breath 01/26/2017  . Non-ST elevation myocardial infarction (NSTEMI), type 2  ([facility]) 01/26/2017  . MGUS (monoclonal gammopathy of unknown significance) 01/11/2012  . Fibromyalgia 01/11/2012  . Bruises easily 01/11/2012    Past Medical History:  Diagnosis Date  . cpap   . Depression    PTSD  . Fibromyalgia   . Hyperlipidemia   . Hypertension   . Migraines     Family History  Problem Relation Age of Onset  . Diabetes Mother   . Hypertension Mother   . CAD Mother   . Diabetes Father   . Lung cancer Father   . Diabetes Sister    Past Surgical History:  Procedure Laterality Date  . APPENDECTOMY    . CESAREAN SECTION    . RIGHT/LEFT HEART CATH AND CORONARY ANGIOGRAPHY N/A 01/27/2017   Procedure: Right/Left Heart Cath and Coronary Angiography;  Surgeon: Troy Sine, MD;  Location: [facility];  Service: Cardiovascular;  Laterality: N/A;   Social History   Social History Narrative  . No narrative on file     Objective: Vital Signs: BP 136/74   Pulse 80   Resp 14   Ht 5' 4"  (1.626 m)   Wt 186 lb (84.4 kg)   LMP 08/19/2012 Comment: reviewed by Baxter Flattery Dingus RT-T  BMI 31.93 kg/m    Physical Exam  Constitutional: She is oriented to person, place, and time. She appears well-developed and well-nourished.  HENT:  Head: Normocephalic and atraumatic.  Eyes: EOM are normal. Pupils are equal, round, and reactive to light.  Cardiovascular: Normal rate, regular rhythm and  normal heart sounds.  Exam reveals no gallop and no friction rub.   No murmur heard. Pulmonary/Chest: Effort normal and breath sounds normal. She has no wheezes. She has no rales.  Abdominal: Soft. Bowel sounds are normal. She exhibits no distension. There is no tenderness. There is no guarding. No hernia.  Musculoskeletal: Normal range of motion. She exhibits no edema, tenderness or deformity.  Lymphadenopathy:    She has no cervical adenopathy.  Neurological: She is alert and oriented to person, place, and time. Coordination normal.  Skin: Skin is warm and dry. Capillary refill  takes less than 2 seconds. No rash noted.  Psychiatric: She has a normal mood and affect. Her behavior is normal.  Nursing note and vitals reviewed.    Musculoskeletal Exam:  Decreased range of motion of bilateral shoulders. Patient does not want to raise her arms up to the ceiling because "they hurt". She states "I can't do it". Grip strength is equal and strong bilaterally  Fiber myalgia tender points are 18 out of 18 positive  CDAI Exam: CDAI Homunculus Exam:   Joint Counts:  CDAI Tender Joint count: 0 CDAI Swollen Joint count: 0  Global Assessments:  Patient Global Assessment: 10 Provider Global Assessment: 10  CDAI Calculated Score: 20    Investigation: No additional findings.  Admission on 01/26/2017, Discharged on 01/28/2017  Component Date Value Ref Range Status  . WBC 01/26/2017 7.0  4.0 - 10.5 K/uL Final  . RBC 01/26/2017 5.30* 3.87 - 5.11 MIL/uL Final  . Hemoglobin 01/26/2017 12.7  12.0 - 15.0 g/dL Final  . HCT 01/26/2017 40.7  36.0 - 46.0 % Final  . MCV 01/26/2017 76.8* 78.0 - 100.0 fL Final  . MCH 01/26/2017 24.0* 26.0 - 34.0 pg Final  . MCHC 01/26/2017 31.2  30.0 - 36.0 g/dL Final  . RDW 01/26/2017 20.3* 11.5 - 15.5 % Final  . Platelets 01/26/2017 PLATELETS APPEAR ADEQUATE  150 - 400 K/uL Final  . Neutrophils Relative % 01/26/2017 76  % Final  . Neutro Abs 01/26/2017 5.3  1.7 - 7.7 K/uL Final  . Lymphocytes Relative 01/26/2017 15  % Final  . Lymphs Abs 01/26/2017 1.0  0.7 - 4.0 K/uL Final  . Monocytes Relative 01/26/2017 8  % Final  . Monocytes Absolute 01/26/2017 0.6  0.1 - 1.0 K/uL Final  . Eosinophils Relative 01/26/2017 1  % Final  . Eosinophils Absolute 01/26/2017 0.1  0.0 - 0.7 K/uL Final  . Basophils Relative 01/26/2017 0  % Final  . Basophils Absolute 01/26/2017 0.0  0.0 - 0.1 K/uL Final  . Troponin i, poc 01/26/2017 0.09* 0.00 - 0.08 ng/mL Final  . Comment 01/26/2017 NOTIFIED PHYSICIAN   Final  . Comment 3 01/26/2017          Final    Comment: Due to the release kinetics of cTnI, a negative result within the first hours of the onset of symptoms does not rule out myocardial infarction with certainty. If myocardial infarction is still suspected, repeat the test at appropriate intervals.   . pH, Arterial 01/26/2017 7.363  7.350 - 7.450 Final  . pCO2 arterial 01/26/2017 45.4  32.0 - 48.0 mmHg Final  . pO2, Arterial 01/26/2017 62.0* 83.0 - 108.0 mmHg Final  . Bicarbonate 01/26/2017 25.9  20.0 - 28.0 mmol/L Final  . TCO2 01/26/2017 27  0 - 100 mmol/L Final  . O2 Saturation 01/26/2017 90.0  % Final  . Patient temperature 01/26/2017 98.6 F   Final  . Collection site  01/26/2017 RADIAL, ALLEN'S TEST ACCEPTABLE   Final  . Drawn by 01/26/2017 Operator   Final  . Sample type 01/26/2017 ARTERIAL   Final  . Sodium 01/26/2017 140  135 - 145 mmol/L Final  . Potassium 01/26/2017 4.8  3.5 - 5.1 mmol/L Final  . Chloride 01/26/2017 109  101 - 111 mmol/L Final  . CO2 01/26/2017 24  22 - 32 mmol/L Final  . Glucose, Bld 01/26/2017 100* 65 - 99 mg/dL Final  . BUN 01/26/2017 9  6 - 20 mg/dL Final  . Creatinine, Ser 01/26/2017 0.79  0.44 - 1.00 mg/dL Final  . Calcium 01/26/2017 9.5  8.9 - 10.3 mg/dL Final  . GFR calc non Af Amer 01/26/2017 >60  >60 mL/min Final  . GFR calc Af Amer 01/26/2017 >60  >60 mL/min Final   Comment: (NOTE) The eGFR has been calculated using the CKD EPI equation. This calculation has not been validated in all clinical situations. eGFR's persistently <60 mL/min signify possible Chronic Kidney Disease.   . Anion gap 01/26/2017 7  5 - 15 Final  . Troponin I 01/26/2017 0.12* <0.03 ng/mL Final   Comment: CRITICAL RESULT CALLED TO, READ BACK BY AND VERIFIED WITH: A.WHEELER,RN [id number] 01/26/17 CLARK,S   . Troponin I 01/26/2017 0.13* <0.03 ng/mL Final  . B Natriuretic Peptide 01/26/2017 224.3* 0.0 - 100.0 pg/mL Final  . Weight 01/26/2017 3056  oz Final  . Height 01/26/2017 66  in Final  . BP 01/26/2017 112/79  mmHg  Final  . Heparin Unfractionated 01/26/2017 0.20* 0.30 - 0.70 IU/mL Final   Comment:        IF HEPARIN RESULTS ARE BELOW EXPECTED VALUES, AND PATIENT DOSAGE HAS BEEN CONFIRMED, SUGGEST FOLLOW UP TESTING OF ANTITHROMBIN III LEVELS.   Marland Kitchen Prothrombin Time 01/26/2017 14.4  11.4 - 15.2 seconds Final  . INR 01/26/2017 1.11   Final  . Heparin Unfractionated 01/27/2017 0.19* 0.30 - 0.70 IU/mL Final   Comment:        IF HEPARIN RESULTS ARE BELOW EXPECTED VALUES, AND PATIENT DOSAGE HAS BEEN CONFIRMED, SUGGEST FOLLOW UP TESTING OF ANTITHROMBIN III LEVELS.   . WBC 01/27/2017 9.1  4.0 - 10.5 K/uL Final  . RBC 01/27/2017 5.31* 3.87 - 5.11 MIL/uL Final  . Hemoglobin 01/27/2017 12.5  12.0 - 15.0 g/dL Final  . HCT 01/27/2017 40.8  36.0 - 46.0 % Final  . MCV 01/27/2017 76.8* 78.0 - 100.0 fL Final  . MCH 01/27/2017 23.5* 26.0 - 34.0 pg Final  . MCHC 01/27/2017 30.6  30.0 - 36.0 g/dL Final  . RDW 01/27/2017 18.3* 11.5 - 15.5 % Final  . Platelets 01/27/2017 167  150 - 400 K/uL Final  . Cholesterol 01/27/2017 117  0 - 200 mg/dL Final  . Triglycerides 01/27/2017 135  <150 mg/dL Final  . HDL 01/27/2017 24* >40 mg/dL Final  . Total CHOL/HDL Ratio 01/27/2017 4.9  RATIO Final  . VLDL 01/27/2017 27  0 - 40 mg/dL Final  . LDL Cholesterol 01/27/2017 66  0 - 99 mg/dL Final   Comment:        Total Cholesterol/HDL:CHD Risk Coronary Heart Disease Risk Table                     Men   Women  1/2 Average Risk   3.4   3.3  Average Risk       5.0   4.4  2 X Average Risk   9.6   7.1  3 X Average  Risk  23.4   11.0        Use the calculated Patient Ratio above and the CHD Risk Table to determine the patient's CHD Risk.        ATP III CLASSIFICATION (LDL):  <100     mg/dL   Optimal  100-129  mg/dL   Near or Above                    Optimal  130-159  mg/dL   Borderline  160-189  mg/dL   High  >190     mg/dL   Very High   . Heparin Unfractionated 01/27/2017 0.62  0.30 - 0.70 IU/mL Final   Comment:          IF HEPARIN RESULTS ARE BELOW EXPECTED VALUES, AND PATIENT DOSAGE HAS BEEN CONFIRMED, SUGGEST FOLLOW UP TESTING OF ANTITHROMBIN III LEVELS.   Marland Kitchen Sodium 01/27/2017 140  135 - 145 mmol/L Final  . Potassium 01/27/2017 4.1  3.5 - 5.1 mmol/L Final  . Chloride 01/27/2017 106  101 - 111 mmol/L Final  . CO2 01/27/2017 25  22 - 32 mmol/L Final  . Glucose, Bld 01/27/2017 115* 65 - 99 mg/dL Final  . BUN 01/27/2017 13  6 - 20 mg/dL Final  . Creatinine, Ser 01/27/2017 0.83  0.44 - 1.00 mg/dL Final  . Calcium 01/27/2017 9.6  8.9 - 10.3 mg/dL Final  . GFR calc non Af Amer 01/27/2017 >60  >60 mL/min Final  . GFR calc Af Amer 01/27/2017 >60  >60 mL/min Final   Comment: (NOTE) The eGFR has been calculated using the CKD EPI equation. This calculation has not been validated in all clinical situations. eGFR's persistently <60 mL/min signify possible Chronic Kidney Disease.   . Anion gap 01/27/2017 9  5 - 15 Final  . WBC 01/28/2017 9.4  4.0 - 10.5 K/uL Final  . RBC 01/28/2017 5.44* 3.87 - 5.11 MIL/uL Final  . Hemoglobin 01/28/2017 13.0  12.0 - 15.0 g/dL Final  . HCT 01/28/2017 42.6  36.0 - 46.0 % Final  . MCV 01/28/2017 78.3  78.0 - 100.0 fL Final  . MCH 01/28/2017 23.9* 26.0 - 34.0 pg Final  . MCHC 01/28/2017 30.5  30.0 - 36.0 g/dL Final  . RDW 01/28/2017 18.9* 11.5 - 15.5 % Final  . Platelets 01/28/2017 178  150 - 400 K/uL Final  . Sodium 01/28/2017 142  135 - 145 mmol/L Final  . Potassium 01/28/2017 4.2  3.5 - 5.1 mmol/L Final  . Chloride 01/28/2017 104  101 - 111 mmol/L Final  . CO2 01/28/2017 28  22 - 32 mmol/L Final  . Glucose, Bld 01/28/2017 103* 65 - 99 mg/dL Final  . BUN 01/28/2017 13  6 - 20 mg/dL Final  . Creatinine, Ser 01/28/2017 0.79  0.44 - 1.00 mg/dL Final  . Calcium 01/28/2017 9.5  8.9 - 10.3 mg/dL Final  . GFR calc non Af Amer 01/28/2017 >60  >60 mL/min Final  . GFR calc Af Amer 01/28/2017 >60  >60 mL/min Final   Comment: (NOTE) The eGFR has been calculated using the CKD EPI  equation. This calculation has not been validated in all clinical situations. eGFR's persistently <60 mL/min signify possible Chronic Kidney Disease.   . Anion gap 01/28/2017 10  5 - 15 Final  . pH, Ven 01/27/2017 7.321  7.250 - 7.430 Final  . pCO2, Ven 01/27/2017 53.5  44.0 - 60.0 mmHg Final  . pO2, Ven 01/27/2017 39.0  32.0 - 45.0  mmHg Final  . Bicarbonate 01/27/2017 27.6  20.0 - 28.0 mmol/L Final  . TCO2 01/27/2017 29  0 - 100 mmol/L Final  . O2 Saturation 01/27/2017 68.0  % Final  . Patient temperature 01/27/2017 HIDE   Final  . Sample type 01/27/2017 VENOUS   Final  . Comment 01/27/2017 NOTIFIED PHYSICIAN   Final  . pH, Arterial 01/27/2017 7.328* 7.350 - 7.450 Final  . pCO2 arterial 01/27/2017 50.6* 32.0 - 48.0 mmHg Final  . pO2, Arterial 01/27/2017 70.0* 83.0 - 108.0 mmHg Final  . Bicarbonate 01/27/2017 26.6  20.0 - 28.0 mmol/L Final  . TCO2 01/27/2017 28  0 - 100 mmol/L Final  . O2 Saturation 01/27/2017 92.0  % Final  . Patient temperature 01/27/2017 HIDE   Final  . Sample type 01/27/2017 ARTERIAL   Final    Imaging: Dg Chest 2 View  Result Date: 01/26/2017 CLINICAL DATA:  Shortness of breath for several days EXAM: CHEST  2 VIEW COMPARISON:  None. FINDINGS: Cardiac shadow is at the upper limits of normal in size. Mild central vascular congestion is noted with very minimal interstitial edema. No sizable effusion or focal infiltrate is noted. No bony abnormality is seen. IMPRESSION: Changes of mild CHF. Electronically Signed   By: Inez Catalina M.D.   On: 01/26/2017 08:22   Ct Angio Chest Pe W Or Wo Contrast  Result Date: 01/26/2017 CLINICAL DATA:  Hypoxia EXAM: CT ANGIOGRAPHY CHEST WITH CONTRAST TECHNIQUE: Multidetector CT imaging of the chest was performed using the standard protocol during bolus administration of intravenous contrast. Multiplanar CT image reconstructions and MIPs were obtained to evaluate the vascular anatomy. CONTRAST:  62 mL Isovue 370 intravenous COMPARISON:   Chest x-ray 01/26/2017 FINDINGS: Cardiovascular: Satisfactory opacification of the pulmonary arteries to the segmental level. No evidence of pulmonary embolism. Non aneurysmal aorta. There is cardiomegaly. Minimal coronary artery calcification. No large pericardial effusion. Mediastinum/Nodes: Nonspecific mediastinal lymph nodes. Midline trachea. No thyroid mass. Esophagus within normal limits. Lungs/Pleura: Lungs are clear. No pleural effusion or pneumothorax. Upper Abdomen: Mild reflux of contrast into the hepatic veins. No acute abnormality. Musculoskeletal: No chest wall abnormality. No acute or significant osseous findings. Mild degenerative changes. Review of the MIP images confirms the above findings. IMPRESSION: 1. Negative for acute pulmonary embolus 2. Cardiomegaly. Mild reflux of contrast into the hepatic veins suggests elevated right heart pressure. 3. Clear lung fields. Electronically Signed   By: Donavan Foil M.D.   On: 01/26/2017 23:15    Speciality Comments: No specialty comments available.    Procedures:  Trigger Point Inj Date/Time: 01/31/2017 3:21 PM Performed by: Eliezer Lofts Authorized by: Eliezer Lofts   Consent Given by:  Patient Site marked: the procedure site was marked   Timeout: prior to procedure the correct patient, procedure, and site was verified   Indications:  Muscle spasm and pain Total # of Trigger Points:  2 Location: neck   Needle Size:  27 G Approach:  Dorsal Medications #1:  0.3 mL lidocaine 1 %; 10 mg triamcinolone acetonide 40 MG/ML Medications #2:  0.3 mL lidocaine 1 %; 10 mg triamcinolone acetonide 40 MG/ML Patient tolerance:  Patient tolerated the procedure well with no immediate complications   Allergies: Citrus and Lisinopril   Assessment / Plan:     Visit Diagnoses: Fibromyalgia  Chronic fatigue  Primary insomnia  Trapezius muscle spasm - 01/31/2017: Injected 10 mg of Kenalog and 0.3 miles 1% lidocaine bilaterally today. No  relief 5 minutes after the injection  Patient was given partial disability by Dr. Estanislado Pandy about 4-5 years ago. Patient is requesting the partial disability because of her into full disability since she is unable to work and make her ENDS MEET I explained to the patient that we are not trained to give full disability and I'll be happy to send a message to Dr. Estanislado Pandy so she can evaluate patient's request and see what can be done from R and (if anything at all) I explained to the patient that functional capacity evaluation needs to be done by trained specialist and that information plus old medical records are taken by patient's representative in front of a judge who makes determination of disability. However, if anything can be done, I will send a message to Dr. Estanislado Pandy and she can discuss with patient.   orders: Orders Placed This Encounter  Procedures  . Trigger Point Injection   Meds ordered this encounter  Medications  . methocarbamol (ROBAXIN) 500 MG tablet    Sig: Take 1 tablet (500 mg total) by mouth 3 (three) times daily.    Dispense:  90 tablet    Refill:  2    Order Specific Question:   Supervising Provider    Answer:   Bo Merino [phone]    Face-to-face time spent with patient was 30 minutes. 50% of time was spent in counseling and coordination of care.  Follow-Up Instructions: Return in about 6 months (around 08/03/2017) for FMS, FATIGUE,INSOMNIA, partial disability, bil trap muscle spasm.   Eliezer Lofts, PA-C  Note - This record has been created using [employer].  Chart creation errors have been sought, but may not always  have been located. Such creation errors do not reflect on  the standard of medical care.

## 2017-02-01 ENCOUNTER — Telehealth: Payer: Self-pay | Admitting: Radiology

## 2017-02-01 NOTE — Telephone Encounter (Signed)
I called pt to advise her Dr Estanislado Pandy will send records to [place] regarding her medical records.However, she can not give any disability recommendations, this is outside of her scope of practice. Patient voiced understanding.

## 2017-02-04 DIAGNOSIS — J9691 Respiratory failure, unspecified with hypoxia: Secondary | ICD-10-CM | POA: Diagnosis not present

## 2017-02-04 DIAGNOSIS — I214 Non-ST elevation (NSTEMI) myocardial infarction: Secondary | ICD-10-CM | POA: Diagnosis not present

## 2017-02-04 DIAGNOSIS — I2510 Atherosclerotic heart disease of native coronary artery without angina pectoris: Secondary | ICD-10-CM | POA: Diagnosis not present

## 2017-02-15 ENCOUNTER — Ambulatory Visit (INDEPENDENT_AMBULATORY_CARE_PROVIDER_SITE_OTHER): Payer: Commercial Managed Care - HMO | Admitting: Pulmonary Disease

## 2017-02-15 ENCOUNTER — Encounter: Payer: Self-pay | Admitting: Pulmonary Disease

## 2017-02-15 VITALS — BP 126/84 | HR 86 | Ht 64.0 in | Wt 186.0 lb

## 2017-02-15 DIAGNOSIS — J9611 Chronic respiratory failure with hypoxia: Secondary | ICD-10-CM

## 2017-02-15 DIAGNOSIS — J449 Chronic obstructive pulmonary disease, unspecified: Secondary | ICD-10-CM | POA: Diagnosis not present

## 2017-02-15 DIAGNOSIS — Z720 Tobacco use: Secondary | ICD-10-CM | POA: Insufficient documentation

## 2017-02-15 DIAGNOSIS — J441 Chronic obstructive pulmonary disease with (acute) exacerbation: Secondary | ICD-10-CM | POA: Insufficient documentation

## 2017-02-15 MED ORDER — FLUTICASONE-UMECLIDIN-VILANT 100-62.5-25 MCG/INH IN AEPB: 1.0000 | INHALATION_SPRAY | Freq: Every day | RESPIRATORY_TRACT | 0 refills | Status: DC

## 2017-02-15 NOTE — Addendum Note (Signed)
Addended by: Valerie Salts on: 02/15/2017 04:28 PM   Modules accepted: Orders

## 2017-02-15 NOTE — Assessment & Plan Note (Signed)
Lung function is at 50% Trial of TRELEGY - call us for prescription if this works.

## 2017-02-15 NOTE — Addendum Note (Signed)
Addended by: Valerie Salts on: 02/15/2017 04:40 PM   Modules accepted: Orders

## 2017-02-15 NOTE — Patient Instructions (Addendum)
Trial of Nicotrol inhaler #4 You have to quit smoking completely  Ambulatory saturation.  Lung function is at 50% Trial of TRELEGY - call us for prescription if this works.

## 2017-02-15 NOTE — Progress Notes (Signed)
Subjective:    Patient ID: Teresa Roberts, female    DOB: 1961-08-17, 56 y.o.   MRN: [id number]  HPI   Chief Complaint  Patient presents with  . Pulm Consult    Per Pt, for SOB. Denies any chest pains today.     56 year old heavy smoker presents for evaluation of COPD and pulmonary hypertension. She was hospitalized 3/14-3/18 where she presented with headaches and hypoxia with saturation in the 60s on walking to the bathroom. CT angiogram did not show pulmonary embolism and showed clear lung fields. ABG showed mild hypercarbia with 7.36/45/62 and 7.33/51/70 . She underwent cardiac catheterization which showed no evidence of coronary artery disease and normal LV function. Right heart catheterization showed are the pressures of 55/14 and LVEDP was 18. PVR not documented She was noted to desaturate on day of discharge and was discharged on 3 L of oxygen with which she has been compliant since discharge. She has attempted to quit smoking and is now smoking about 1-2 cigarettes a day, using a nicotine patch. She smoked about 30-pack-years  Oxygen saturation was 92% on room air  Spirometry showed a ratio of 69, FEV1 of 1.15-52% and FVC of 60%   Past Medical History:  Diagnosis Date  . cpap   . Depression    PTSD  . Fibromyalgia   . Hyperlipidemia   . Hypertension   . Migraines    Past Surgical History:  Procedure Laterality Date  . APPENDECTOMY    . CESAREAN SECTION    . RIGHT/LEFT HEART CATH AND CORONARY ANGIOGRAPHY N/A 01/27/2017   Procedure: Right/Left Heart Cath and Coronary Angiography;  Surgeon: Troy Sine, MD;  Location: [facility];  Service: Cardiovascular;  Laterality: N/A;    Allergies  Allergen Reactions  . Citrus Hives  . Lisinopril Cough     Social History   Social History  . Marital status: Married    Spouse name: N/A  . Number of children: N/A  . Years of education: N/A   Occupational History  . Not on file.   Social History Main Topics  .  Smoking status: Current Every Day Smoker    Packs/day: 1.00    Years: 39.00  . Smokeless tobacco: Current User     Comment: Handouts given to pt & family  . Alcohol use 0.6 oz/week    1 Glasses of wine per week  . Drug use: No  . Sexual activity: Not on file   Other Topics Concern  . Not on file   Social History Narrative  . No narrative on file    Family History  Problem Relation Age of Onset  . Diabetes Mother   . Hypertension Mother   . CAD Mother   . Diabetes Father   . Lung cancer Father   . Diabetes Sister       Review of Systems  Positive for shortness of breath with activity and rest, weight gain, anxiety and depression, headaches and joint stiffness  Constitutional: negative for anorexia, fevers and sweats  Eyes: negative for irritation, redness and visual disturbance  Ears, nose, mouth, throat, and face: negative for earaches, epistaxis, nasal congestion and sore throat  Respiratory: negative for cough,sputum and wheezing  Cardiovascular: negative for chest pain,  lower extremity edema, orthopnea, palpitations and syncope  Gastrointestinal: negative for abdominal pain, constipation, diarrhea, melena, nausea and vomiting  Genitourinary:negative for dysuria, frequency and hematuria  Hematologic/lymphatic: negative for bleeding, easy bruising and lymphadenopathy  Musculoskeletal:negative for arthralgias,  muscle weakness and stiff joints  Neurological: negative for coordination problems, gait problems, headaches and weakness  Endocrine: negative for diabetic symptoms including polydipsia, polyuria and weight loss     Objective:   Physical Exam  Gen. Pleasant, obese, in no distress, normal affect ENT - no lesions, no post nasal drip, class 2-3 airway Neck: No JVD, no thyromegaly, no carotid bruits Lungs: no use of accessory muscles, no dullness to percussion, decreased without rales or rhonchi  Cardiovascular: Rhythm regular, heart sounds  normal, no murmurs  or gallops, no peripheral edema Abdomen: soft and non-tender, no hepatosplenomegaly, BS normal. Musculoskeletal: No deformities, no cyanosis or clubbing Neuro:  alert, non focal, no tremors       Assessment & Plan:

## 2017-02-15 NOTE — Assessment & Plan Note (Signed)
  Ambulatory saturation. If she needs long-term oxygen, we'll consider portable concentrator next visit

## 2017-02-15 NOTE — Assessment & Plan Note (Signed)
Trial of Nicotrol inhaler #4 You have to quit smoking completely She was counseled

## 2017-02-16 ENCOUNTER — Telehealth: Payer: Self-pay | Admitting: Pulmonary Disease

## 2017-02-16 ENCOUNTER — Other Ambulatory Visit: Payer: Self-pay | Admitting: Pulmonary Disease

## 2017-02-16 NOTE — Telephone Encounter (Signed)
Attempted to contact pt, but mailbox was full

## 2017-02-16 NOTE — Telephone Encounter (Signed)
Patient returned phone call.Teresa Roberts ° °

## 2017-02-16 NOTE — Telephone Encounter (Signed)
Spoke with Corene Cornea at [facility], states that they cannot provide O2 for the patient since the [place] is the current prescriber. The order for the POC needs to go through the [place] not [facility]. Will send to [facility] to have then re-route this order to the correct location >>> [place]. Thanks.

## 2017-02-16 NOTE — Telephone Encounter (Signed)
[id number] x1 

## 2017-02-17 MED ORDER — NICOTINE 10 MG IN INHA: 1.0000 | RESPIRATORY_TRACT | 0 refills | Status: DC | PRN

## 2017-02-17 NOTE — Telephone Encounter (Signed)
Okay; thanks.

## 2017-02-17 NOTE — Telephone Encounter (Signed)
Called and spoke to pt. Pt's Nicotrol was not sent to pharmacy during [facility] on 4.3.2018. OV instructions states for pt to use the Nicotrol #4. Pt is requesting this to be called in today. Pt does not need a call back, pt requests this be sent to [facility] on [address].   Dr. Elsworth Soho please advise if the Nicotrol 10mg  is the correct strength and please specify the frequency. Thanks.

## 2017-02-17 NOTE — Telephone Encounter (Signed)
[facility] [place]

## 2017-02-17 NOTE — Telephone Encounter (Signed)
Was unavble to send order to the [facility] pt went to [facility] [place] in [place] today and they are ordering her 02 for her so I am going to cancel this order Teresa Roberts

## 2017-02-17 NOTE — Telephone Encounter (Signed)
Nicotrol inhaler 10 mg - use every 2 hours as needed #4

## 2017-02-17 NOTE — Telephone Encounter (Signed)
atc pt X2, no answer, vm full.  wcb.

## 2017-02-17 NOTE — Telephone Encounter (Signed)
Patient returned phone call.Teresa Roberts ° °

## 2017-02-25 ENCOUNTER — Other Ambulatory Visit: Payer: Self-pay | Admitting: Internal Medicine

## 2017-02-28 ENCOUNTER — Other Ambulatory Visit: Payer: Self-pay | Admitting: Internal Medicine

## 2017-03-18 ENCOUNTER — Other Ambulatory Visit: Payer: Self-pay | Admitting: Internal Medicine

## 2017-03-22 ENCOUNTER — Telehealth: Payer: Self-pay | Admitting: Pulmonary Disease

## 2017-03-22 ENCOUNTER — Encounter: Payer: Self-pay | Admitting: Pulmonary Disease

## 2017-03-22 ENCOUNTER — Ambulatory Visit (INDEPENDENT_AMBULATORY_CARE_PROVIDER_SITE_OTHER): Payer: Commercial Managed Care - HMO | Admitting: Pulmonary Disease

## 2017-03-22 DIAGNOSIS — J449 Chronic obstructive pulmonary disease, unspecified: Secondary | ICD-10-CM | POA: Diagnosis not present

## 2017-03-22 DIAGNOSIS — J9611 Chronic respiratory failure with hypoxia: Secondary | ICD-10-CM

## 2017-03-22 NOTE — Assessment & Plan Note (Signed)
Continue on oxygen for now during exertion and sleep but she can stay off it for longer periods  Full reassess on next visit in 3 months

## 2017-03-22 NOTE — Patient Instructions (Signed)
Take Symbicort 2 puffs twice daily Okay to use albuterol MDI 2 puffs as needed only for wheezing or shortness of breath  [facility] once [facility] to [facility]  Keep working on quitting smoking

## 2017-03-22 NOTE — Assessment & Plan Note (Signed)
Take Symbicort 2 puffs twice daily Okay to use albuterol MDI 2 puffs as needed only for wheezing or shortness of breath  [facility] once daily to regimen  Keep working on quitting smoking

## 2017-03-22 NOTE — Telephone Encounter (Signed)
Spoke with patient informed her Dr. Elsworth Soho wanted her to remain off oxygen and it will be reassessed at her next appointment. Pt verbalized understanding. Nothing further is needed.

## 2017-03-22 NOTE — Progress Notes (Signed)
   Subjective:    Patient ID: Teresa Roberts, female    DOB: 07-02-1961, 56 y.o.   MRN: [id number]  HPI  56 year old heavy smoker for FU of COPD and pulmonary hypertension. She was hospitalized 01/2017 where she presented with headaches and hypoxia with saturation in the 60s on walking to the bathroom.  She was discharged on 3 L of oxygen  She smoked about 30-pack-years  On initial consultation with [place] 02/2017 she was noted to desaturate. She was able to get oxygen from the [place]. She feels that she is breathing better today and ready to come off oxygen. She was given a sample of trelegy which really helped her-she received Symbicort prescription from the [place] and albuterol. Albuterol causes headaches and tachycardia and palpitations and tremors so she has not been using it.  She was given Nicotrol Inhaler and she has been able to decrease smoking 3-4 cigarettes per day from her earlier half-pack per day  She desaturated from 95% at rest to 87% on walking 3 laps around the office and recovered quickly  Significant tests/ events reviewed  CT angiogram neg for pulmonary embolism .  ABG showed mild hypercarbia with 7.36/45/62 and 7.33/51/70 .  LHC  showed no evidence of coronary artery disease and normal LV function.  RHC showed RV pressures of 55/14 and LVEDP was 18. PVR not documented  Spirometry 02/2017  ratio of 69, FEV1 of 1.15-52% and FVC of 60%   Review of Systems neg for any significant sore throat, dysphagia, itching, sneezing, nasal congestion or excess/ purulent secretions, fever, chills, sweats, unintended wt loss, pleuritic or exertional cp, hempoptysis, orthopnea pnd or change in chronic leg swelling. Also denies presyncope, palpitations, heartburn, abdominal pain, nausea, vomiting, diarrhea or change in bowel or urinary habits, dysuria,hematuria, rash, arthralgias, visual complaints, headache, numbness weakness or ataxia.     Objective:   Physical Exam  Gen. Pleasant,  well-nourished, in no distress ENT - no thrush, no post nasal drip Neck: No JVD, no thyromegaly, no carotid bruits Lungs: no use of accessory muscles, no dullness to percussion,decreased BL  without rales or rhonchi  Cardiovascular: Rhythm regular, heart sounds  normal, no murmurs or gallops, no peripheral edema Musculoskeletal: No deformities, no cyanosis or clubbing        Assessment & Plan:

## 2017-04-29 ENCOUNTER — Other Ambulatory Visit: Payer: Self-pay | Admitting: Rheumatology

## 2017-04-29 NOTE — Telephone Encounter (Signed)
Last Visit: 01/31/17 Next Visit: 08/09/17  Okay to refill Methocarbamol?

## 2017-06-13 DIAGNOSIS — F5101 Primary insomnia: Secondary | ICD-10-CM | POA: Insufficient documentation

## 2017-06-13 DIAGNOSIS — R5383 Other fatigue: Secondary | ICD-10-CM | POA: Insufficient documentation

## 2017-06-13 DIAGNOSIS — Z8669 Personal history of other diseases of the nervous system and sense organs: Secondary | ICD-10-CM | POA: Insufficient documentation

## 2017-06-15 ENCOUNTER — Encounter: Payer: Self-pay | Admitting: Rheumatology

## 2017-06-15 ENCOUNTER — Ambulatory Visit (INDEPENDENT_AMBULATORY_CARE_PROVIDER_SITE_OTHER): Payer: Commercial Managed Care - HMO | Admitting: Rheumatology

## 2017-06-15 VITALS — BP 124/68 | HR 78 | Resp 14 | Ht 66.0 in | Wt 194.0 lb

## 2017-06-15 DIAGNOSIS — J449 Chronic obstructive pulmonary disease, unspecified: Secondary | ICD-10-CM

## 2017-06-15 DIAGNOSIS — M797 Fibromyalgia: Secondary | ICD-10-CM

## 2017-06-15 DIAGNOSIS — M7062 Trochanteric bursitis, left hip: Secondary | ICD-10-CM

## 2017-06-15 DIAGNOSIS — G8929 Other chronic pain: Secondary | ICD-10-CM

## 2017-06-15 DIAGNOSIS — M25511 Pain in right shoulder: Secondary | ICD-10-CM | POA: Diagnosis not present

## 2017-06-15 DIAGNOSIS — Z720 Tobacco use: Secondary | ICD-10-CM

## 2017-06-15 DIAGNOSIS — R5383 Other fatigue: Secondary | ICD-10-CM | POA: Diagnosis not present

## 2017-06-15 DIAGNOSIS — F5101 Primary insomnia: Secondary | ICD-10-CM

## 2017-06-15 DIAGNOSIS — D472 Monoclonal gammopathy: Secondary | ICD-10-CM

## 2017-06-15 DIAGNOSIS — Z8669 Personal history of other diseases of the nervous system and sense organs: Secondary | ICD-10-CM

## 2017-06-15 DIAGNOSIS — M25512 Pain in left shoulder: Secondary | ICD-10-CM

## 2017-06-15 MED ORDER — TRIAMCINOLONE ACETONIDE 40 MG/ML IJ SUSP
40.0000 mg | INTRAMUSCULAR | Status: AC | PRN
  Administered 2017-06-15: 40 mg via INTRA_ARTICULAR

## 2017-06-15 MED ORDER — METHOCARBAMOL 500 MG PO TABS: 500.0000 mg | ORAL_TABLET | Freq: Three times a day (TID) | ORAL | 4 refills | Status: DC

## 2017-06-15 MED ORDER — LIDOCAINE HCL 1 % IJ SOLN
1.5000 mL | INTRAMUSCULAR | Status: AC | PRN
  Administered 2017-06-15: 1.5 mL

## 2017-06-15 NOTE — Progress Notes (Signed)
Office Visit Note  Patient: Teresa Roberts             Date of Birth: Jan 09, 1961           MRN: [id number]             PCP: Jonathon Jordan, MD Referring: Jonathon Jordan, MD Visit Date: 06/15/2017 Occupation: @GUAROCC @    Subjective:  Muscle Pain (c/o increased pain past month can't walk entire body tightens up )   History of Present Illness: Nelwyn Hebdon is a 56 y.o. female with history of fibromyalgia syndrome. According to her she's been having discomfort everywhere. She describes her pain  all the way from her shoulders down to her lower extremities. She states difficult for her to move around and take care of herself. She reports hand swelling off and on. She has difficulty walking.  Activities of Daily Living:  Patient reports morning stiffness for  all day.   Patient Denies nocturnal pain.  Difficulty dressing/grooming: Reports Difficulty climbing stairs: Reports Difficulty getting out of chair: Reports Difficulty using hands for taps, buttons, cutlery, and/or writing: Reports   Review of Systems  Constitutional: Positive for fatigue. Negative for night sweats, weight gain, weight loss and weakness.  HENT: Positive for mouth dryness. Negative for mouth sores, trouble swallowing, trouble swallowing and nose dryness.   Eyes: Negative for pain, redness, visual disturbance and dryness.       States vision is blurry   Respiratory: Positive for cough and shortness of breath. Negative for difficulty breathing.   Cardiovascular: Negative.  Negative for chest pain, palpitations, hypertension, irregular heartbeat and swelling in legs/feet.  Gastrointestinal: Negative.  Negative for blood in stool, constipation and diarrhea.  Endocrine: Negative for cold intolerance and increased urination.  Genitourinary: Negative.  Negative for vaginal dryness.  Musculoskeletal: Positive for gait problem, myalgias, morning stiffness, muscle tenderness and myalgias. Negative for arthralgias, joint pain,  joint swelling and muscle weakness.  Skin: Negative.  Negative for color change, rash, hair loss, skin tightness, ulcers and sensitivity to sunlight.  Allergic/Immunologic: Negative.  Negative for susceptible to infections.  Neurological: Negative for dizziness, headaches, memory loss and night sweats.       Has migraines  Hematological: Negative for bruising/bleeding tendency and swollen glands.       Easy bruising  Psychiatric/Behavioral: Positive for depressed mood. Negative for suicidal ideas and sleep disturbance. The patient is not nervous/anxious.     PMFS History:  Patient Active Problem List   Diagnosis Date Noted  . Other fatigue 06/13/2017  . Primary insomnia 06/13/2017  . History of migraine 06/13/2017  . COPD (chronic obstructive pulmonary disease) ([place]) 02/15/2017  . Tobacco abuse 02/15/2017  . Respiratory failure with hypoxia ([place]) 01/28/2017  . Migraine 01/28/2017  . Daily headache   . Hypoxia   . Shortness of breath 01/26/2017  . Non-ST elevation myocardial infarction (NSTEMI), type 2 01/26/2017  . MGUS (monoclonal gammopathy of unknown significance) 01/11/2012  . Fibromyalgia 01/11/2012  . Bruises easily 01/11/2012    Past Medical History:  Diagnosis Date  . cpap   . Depression    PTSD  . Fibromyalgia   . Hyperlipidemia   . Hypertension   . Migraines     Family History  Problem Relation Age of Onset  . Diabetes Mother   . Hypertension Mother   . CAD Mother   . Diabetes Father   . Lung cancer Father   . Diabetes Sister    Past Surgical History:  Procedure Laterality  Date  . APPENDECTOMY    . CESAREAN SECTION    . RIGHT/LEFT HEART CATH AND CORONARY ANGIOGRAPHY N/A 01/27/2017   Procedure: Right/Left Heart Cath and Coronary Angiography;  Surgeon: Troy Sine, MD;  Location: [facility];  Service: Cardiovascular;  Laterality: N/A;   Social History   Social History Narrative  . No narrative on file     Objective: Vital Signs: BP  124/68   Pulse 78   Resp 14   Ht 5\' 6"  (1.676 m)   Wt 194 lb (88 kg)   LMP 08/19/2012 Comment: reviewed by Baxter Flattery Dingus RT-T  BMI 31.31 kg/m    Physical Exam  Constitutional: She is oriented to person, place, and time. She appears well-developed and well-nourished.  HENT:  Head: Normocephalic and atraumatic.  Eyes: Conjunctivae and EOM are normal.  Neck: Normal range of motion.  Cardiovascular: Normal rate, regular rhythm, normal heart sounds and intact distal pulses.   Pulmonary/Chest: Effort normal and breath sounds normal.  Abdominal: Soft. Bowel sounds are normal.  Lymphadenopathy:    She has no cervical adenopathy.  Neurological: She is alert and oriented to person, place, and time.  Skin: Skin is warm and dry. Capillary refill takes less than 2 seconds.  Psychiatric: She has a normal mood and affect. Her behavior is normal.  Nursing note and vitals reviewed.    Musculoskeletal Exam: C-spine and thoracic lumbar spine good range of motion. She has discomfort range of motion of bilateral shoulders. Elbow joints wrist joint MCPs PIPs DIPs with good range of motion with no synovitis. Hip joints knee joints ankles MTPs PIPs DIPs with good range of motion with no synovitis. She had tenderness on palpation over left trochanteric bursa. Consistent with left trochanteric bursitis.  CDAI Exam: No CDAI exam completed.    Investigation: No additional findings.   Imaging: No results found.  Speciality Comments: No specialty comments available.    Procedures:  Large Joint Inj Date/Time: 06/15/2017 9:45 AM Performed by: Bo Merino Authorized by: Bo Merino   Consent Given by:  Patient Site marked: the procedure site was marked   Timeout: prior to procedure the correct patient, procedure, and site was verified   Indications:  Pain Location:  Hip Site:  L greater trochanter Prep: patient was prepped and draped in usual sterile fashion   Needle Size:  27  G Needle Length:  1.5 inches Approach:  Lateral Ultrasound Guidance: No   Fluoroscopic Guidance: No   Arthrogram: No   Medications:  40 mg triamcinolone acetonide 40 MG/ML; 1.5 mL lidocaine 1 % Aspiration Attempted: No   Aspirate amount (mL):  0 Patient tolerance:  Patient tolerated the procedure well with no immediate complications   Allergies: Citrus and Lisinopril   Assessment / Plan:     Visit Diagnoses: Fibromyalgia: She continues to have generalized pain and discomfort and positive tender points. She ran out of methocarbamol. Per her request prescription refill for methocarbamol was given.  Other fatigue: Reports fatigue due to insomnia. Need for regular exercise was discussed.  Primary insomnia: She has chronic insomnia  Trochanteric bursitis of left hip: She has severe left trochanteric pain. Per her request the left trochanteric area was prepped and injected with cortisone as described above.  Chronic pain of both shoulders: Need for regular exercise were discussed.  MGUS (monoclonal gammopathy of unknown significance)  Chronic obstructive pulmonary disease, unspecified COPD type ([place])  Tobacco abuse: Smoking cessation was discussed at length.  History of migraine  Orders: Orders Placed This Encounter  Procedures  . Large Joint Injection/Arthrocentesis   Meds ordered this encounter  Medications  . methocarbamol (ROBAXIN) 500 MG tablet    Sig: Take 1 tablet (500 mg total) by mouth 3 (three) times daily.    Dispense:  90 tablet    Refill:  4    Face-to-face time spent with patient was 30 minutes. Greater than 50% of time was spent in counseling and coordination of care.  Follow-Up Instructions: Return in about 1 year (around 06/15/2018), or if symptoms worsen or fail to improve, for FMS.   Bo Merino, MD  Note - This record has been created using Editor, commissioning.  Chart creation errors have been sought, but may not always  have been located. Such  creation errors do not reflect on  the standard of medical care.

## 2017-06-22 ENCOUNTER — Encounter: Payer: Self-pay | Admitting: Adult Health

## 2017-06-22 ENCOUNTER — Ambulatory Visit (INDEPENDENT_AMBULATORY_CARE_PROVIDER_SITE_OTHER): Payer: 59 | Admitting: Adult Health

## 2017-06-22 DIAGNOSIS — J449 Chronic obstructive pulmonary disease, unspecified: Secondary | ICD-10-CM | POA: Diagnosis not present

## 2017-06-22 DIAGNOSIS — J9611 Chronic respiratory failure with hypoxia: Secondary | ICD-10-CM | POA: Diagnosis not present

## 2017-06-22 MED ORDER — FLUTICASONE PROPIONATE 50 MCG/ACT NA SUSP: 2.0000 | Freq: Every day | NASAL | 5 refills | Status: DC

## 2017-06-22 MED ORDER — TIOTROPIUM BROMIDE MONOHYDRATE 18 MCG IN CAPS: 18.0000 ug | ORAL_CAPSULE | Freq: Every day | RESPIRATORY_TRACT | 5 refills | Status: DC

## 2017-06-22 NOTE — Assessment & Plan Note (Signed)
Will add Spriiva to her regimen   Plan  Patient Instructions  Continue on Symbicort 2 puffs Twice daily  , rinse after use.  Add Spiriva Handihaler 1 puff daily .  Work on not smoking .  May use Flonase 2 puff daily As needed  Nasal congestion  Saline nasal spray and gel .As needed   Follow up with Dr. Elsworth Soho  In 4 months and As needed

## 2017-06-22 NOTE — Patient Instructions (Addendum)
Continue on Symbicort 2 puffs Twice daily  , rinse after use.  Add Spiriva Handihaler 1 puff daily .  Work on not smoking .  Continue on oxygen 2l/ with walking .  May use Flonase 2 puff daily As needed  Nasal congestion  Saline nasal spray and gel .As needed   Follow up with Dr. Elsworth Soho  In 4 months and As needed

## 2017-06-22 NOTE — Progress Notes (Signed)
@Patient  ID: Teresa Roberts, female    DOB: 27-Jan-1961, 56 y.o.   MRN: [id number]  Chief Complaint  Patient presents with  . Follow-up    COPD     Referring provider: Jonathon Jordan, MD  HPI: 56 year old female, active smoker followed for COPD, pulmonary hypertension, oxygen dependent respiratory failure  Significant tests/ events reviewed  CT angiogram neg for pulmonary embolism .  ABG showed mild hypercarbia with 7.36/45/62 and 7.33/51/70 .  LHC  showed no evidence of coronary artery disease and normal LV function.  RHC showed RV pressures of 55/14 and LVEDP was 18. PVR not documented  Spirometry 02/2017  ratio of 69, FEV1 of 1.15-52% and FVC of 60%  06/22/2017 Follow up : COPD , O2 RF  Pt returns for 3 months follow up . Says she is doing okay with breathing. Gets winded easily . Previously on TRELEGY but [place] will not cover TRELEGY. , She is on Symbicort Twice daily  .  Currently on Oxygen 3l/m with activity . Cut back on smoking , down to 1/2 PPD.  Complains that oxygen burns her nose.  Has nasal congestion and drainage .  No fever chest pain, orthopnea, edema or fever.  Walk test in office with O2 sats 81% on room air, on O2 at 2l/ >90%.     Allergies  Allergen Reactions  . Citrus Hives  . Lisinopril Cough    Immunization History  Administered Date(s) Administered  . Influenza-Unspecified 08/15/2016  . Pneumococcal-Unspecified 02/13/2013    Past Medical History:  Diagnosis Date  . cpap   . Depression    PTSD  . Fibromyalgia   . Hyperlipidemia   . Hypertension   . Migraines     Tobacco History: History  Smoking Status  . Current Every Day Smoker  . Packs/day: 1.00  . Years: 39.00  . Types: Cigarettes  Smokeless Tobacco  . Current User    Comment: down to 0.5ppd 06/22/17   Ready to quit: No Counseling given: Yes   Outpatient Encounter Prescriptions as of 06/22/2017  Medication Sig  . Acidophilus Lactobacillus CAPS Take 2 capsules by mouth every  morning.  . Biotin 1 MG CAPS Take 1 mg by mouth daily.  . busPIRone (BUSPAR) 10 MG tablet Take 5 mg by mouth 2 (two) times daily.  Marland Kitchen docusate sodium (COLACE) 100 MG capsule Take 100 mg by mouth 3 (three) times daily.  . DULoxetine (CYMBALTA) 30 MG capsule   . Fluticasone-Umeclidin-Vilant (TRELEGY ELLIPTA) 100-62.5-25 MCG/INH AEPB Inhale 1 puff into the lungs daily.  Marland Kitchen gabapentin (NEURONTIN) 300 MG capsule Take 800 mg by mouth 2 (two) times daily.   . hydrochlorothiazide (HYDRODIURIL) 25 MG tablet Take 1 tablet (25 mg total) by mouth daily.  . methocarbamol (ROBAXIN) 500 MG tablet Take 1 tablet (500 mg total) by mouth 3 (three) times daily.  Marland Kitchen omeprazole (PRILOSEC OTC) 20 MG tablet Take 40 mg by mouth daily.  . rizatriptan (MAXALT-MLT) 10 MG disintegrating tablet Take 10 mg by mouth as needed for migraine. May repeat in 2 hours if needed  . simvastatin (ZOCOR) 10 MG tablet Take 10 mg by mouth at bedtime.  . traZODone (DESYREL) 100 MG tablet Take 100 mg by mouth at bedtime as needed for sleep.  . vitamin B-12 (CYANOCOBALAMIN) 100 MCG tablet Take 100 mcg by mouth daily.  . [DISCONTINUED] mesalamine (CANASA) 1000 MG suppository Place 1,000 mg rectally at bedtime.   No facility-administered encounter medications on file as of 06/22/2017.  Review of Systems  Constitutional:   No  weight loss, night sweats,  Fevers, chills, fatigue, or  lassitude.  HEENT:   No headaches,  Difficulty swallowing,  Tooth/dental problems, or  Sore throat,                No sneezing, itching, ear ache, + nasal congestion, post nasal drip,   CV:  No chest pain,  Orthopnea, PND, swelling in lower extremities, anasarca, dizziness, palpitations, syncope.   GI  No heartburn, indigestion, abdominal pain, nausea, vomiting, diarrhea, change in bowel habits, loss of appetite, bloody stools.   Resp:   No excess mucus, no productive cough,  No non-productive cough,  No coughing up of blood.  No change in color of mucus.   No wheezing.  No chest wall deformity  Skin: no rash or lesions.  GU: no dysuria, change in color of urine, no urgency or frequency.  No flank pain, no hematuria   MS:  No joint pain or swelling.  No decreased range of motion.  No back pain.    Physical Exam  BP 114/68 (BP Location: Left Arm, Cuff Size: Normal)   Pulse 77   Ht 5\' 6"  (1.676 m)   Wt 193 lb (87.5 kg)   LMP 08/19/2012 Comment: reviewed by Baxter Flattery Dingus RT-T  SpO2 92%   BMI 31.15 kg/m   GEN: A/Ox3; pleasant , NAD, obese    HEENT:  [place]/AT,  EACs-clear, TMs-wnl, NOSE-clear, THROAT-clear, no lesions, no postnasal drip or exudate noted.   NECK:  Supple w/ fair ROM; no JVD; normal carotid impulses w/o bruits; no thyromegaly or nodules palpated; no lymphadenopathy.    RESP  Clear  P & A; w/o, wheezes/ rales/ or rhonchi. no accessory muscle use, no dullness to percussion  CARD:  RRR, no m/r/g, no peripheral edema, pulses intact, no cyanosis or clubbing.  GI:   Soft & nt; nml bowel sounds; no organomegaly or masses detected.   Musco: Warm bil, no deformities or joint swelling noted.   Neuro: alert, no focal deficits noted.    Skin: Warm, no lesions or rashes    Lab Results:  CBC   ProBNP No results found for: PROBNP  Imaging: No results found.   Assessment & Plan:   No problem-specific Assessment & Plan notes found for this encounter.     Rexene Edison, NP 06/22/2017

## 2017-06-22 NOTE — Assessment & Plan Note (Addendum)
desats on room air , O2 sats 2l/m >90%.

## 2017-06-29 ENCOUNTER — Other Ambulatory Visit: Payer: Self-pay | Admitting: Rheumatology

## 2017-06-29 NOTE — Telephone Encounter (Signed)
01/31/17 last visit (injection only in August) Next visit needs to be scheduled message sent for her to be called to schedule  Ok to refill per Dr Estanislado Pandy

## 2017-07-20 ENCOUNTER — Other Ambulatory Visit: Payer: Self-pay | Admitting: *Deleted

## 2017-07-20 MED ORDER — METAXALONE 800 MG PO TABS
800.0000 mg | ORAL_TABLET | Freq: Three times a day (TID) | ORAL | 2 refills | Status: DC | PRN
Start: 2017-07-20 — End: 2017-08-13

## 2017-07-20 NOTE — Telephone Encounter (Signed)
Received fax from pharmacy stating Methocarbamol is on back order or not currently available. Pharmacy requesting a medication change.  Per Dr. Estanislado Pandy okay to change prescription to Skelaxin 800 mg TID prn

## 2017-08-09 ENCOUNTER — Inpatient Hospital Stay (HOSPITAL_COMMUNITY): Payer: 59

## 2017-08-09 ENCOUNTER — Ambulatory Visit: Payer: Self-pay | Admitting: Rheumatology

## 2017-08-09 ENCOUNTER — Emergency Department (HOSPITAL_COMMUNITY): Payer: 59

## 2017-08-09 ENCOUNTER — Inpatient Hospital Stay (HOSPITAL_COMMUNITY)
Admission: EM | Admit: 2017-08-09 | Discharge: 2017-08-13 | DRG: 853 | Disposition: A | Discharge status: Home / Self Care | Payer: 59 | Attending: Internal Medicine | Admitting: Internal Medicine

## 2017-08-09 ENCOUNTER — Encounter (HOSPITAL_COMMUNITY): Payer: Self-pay | Admitting: Emergency Medicine

## 2017-08-09 DIAGNOSIS — J441 Chronic obstructive pulmonary disease with (acute) exacerbation: Secondary | ICD-10-CM | POA: Diagnosis present

## 2017-08-09 DIAGNOSIS — J440 Chronic obstructive pulmonary disease with acute lower respiratory infection: Secondary | ICD-10-CM | POA: Diagnosis present

## 2017-08-09 DIAGNOSIS — I959 Hypotension, unspecified: Secondary | ICD-10-CM | POA: Diagnosis present

## 2017-08-09 DIAGNOSIS — A419 Sepsis, unspecified organism: Secondary | ICD-10-CM | POA: Diagnosis not present

## 2017-08-09 DIAGNOSIS — G43909 Migraine, unspecified, not intractable, without status migrainosus: Secondary | ICD-10-CM | POA: Diagnosis present

## 2017-08-09 DIAGNOSIS — I361 Nonrheumatic tricuspid (valve) insufficiency: Secondary | ICD-10-CM | POA: Diagnosis not present

## 2017-08-09 DIAGNOSIS — Z9049 Acquired absence of other specified parts of digestive tract: Secondary | ICD-10-CM | POA: Diagnosis not present

## 2017-08-09 DIAGNOSIS — S82842A Displaced bimalleolar fracture of left lower leg, initial encounter for closed fracture: Secondary | ICD-10-CM | POA: Diagnosis present

## 2017-08-09 DIAGNOSIS — J449 Chronic obstructive pulmonary disease, unspecified: Secondary | ICD-10-CM | POA: Diagnosis not present

## 2017-08-09 DIAGNOSIS — Z888 Allergy status to other drugs, medicaments and biological substances status: Secondary | ICD-10-CM

## 2017-08-09 DIAGNOSIS — F329 Major depressive disorder, single episode, unspecified: Secondary | ICD-10-CM | POA: Diagnosis present

## 2017-08-09 DIAGNOSIS — W19XXXA Unspecified fall, initial encounter: Secondary | ICD-10-CM | POA: Diagnosis not present

## 2017-08-09 DIAGNOSIS — R0602 Shortness of breath: Secondary | ICD-10-CM | POA: Diagnosis not present

## 2017-08-09 DIAGNOSIS — N390 Urinary tract infection, site not specified: Secondary | ICD-10-CM

## 2017-08-09 DIAGNOSIS — Z801 Family history of malignant neoplasm of trachea, bronchus and lung: Secondary | ICD-10-CM | POA: Diagnosis not present

## 2017-08-09 DIAGNOSIS — N179 Acute kidney failure, unspecified: Secondary | ICD-10-CM | POA: Diagnosis not present

## 2017-08-09 DIAGNOSIS — M797 Fibromyalgia: Secondary | ICD-10-CM | POA: Diagnosis not present

## 2017-08-09 DIAGNOSIS — S8292XA Unspecified fracture of left lower leg, initial encounter for closed fracture: Secondary | ICD-10-CM | POA: Diagnosis not present

## 2017-08-09 DIAGNOSIS — Z79899 Other long term (current) drug therapy: Secondary | ICD-10-CM

## 2017-08-09 DIAGNOSIS — J90 Pleural effusion, not elsewhere classified: Secondary | ICD-10-CM | POA: Diagnosis not present

## 2017-08-09 DIAGNOSIS — R0600 Dyspnea, unspecified: Secondary | ICD-10-CM | POA: Diagnosis not present

## 2017-08-09 DIAGNOSIS — Z419 Encounter for procedure for purposes other than remedying health state, unspecified: Secondary | ICD-10-CM

## 2017-08-09 DIAGNOSIS — I130 Hypertensive heart and chronic kidney disease with heart failure and stage 1 through stage 4 chronic kidney disease, or unspecified chronic kidney disease: Secondary | ICD-10-CM | POA: Diagnosis present

## 2017-08-09 DIAGNOSIS — R748 Abnormal levels of other serum enzymes: Secondary | ICD-10-CM | POA: Diagnosis not present

## 2017-08-09 DIAGNOSIS — R740 Nonspecific elevation of levels of transaminase and lactic acid dehydrogenase [LDH]: Secondary | ICD-10-CM

## 2017-08-09 DIAGNOSIS — I5033 Acute on chronic diastolic (congestive) heart failure: Secondary | ICD-10-CM

## 2017-08-09 DIAGNOSIS — Z09 Encounter for follow-up examination after completed treatment for conditions other than malignant neoplasm: Secondary | ICD-10-CM

## 2017-08-09 DIAGNOSIS — R4020 Unspecified coma: Secondary | ICD-10-CM | POA: Diagnosis not present

## 2017-08-09 DIAGNOSIS — S82402A Unspecified fracture of shaft of left fibula, initial encounter for closed fracture: Secondary | ICD-10-CM | POA: Diagnosis not present

## 2017-08-09 DIAGNOSIS — Z9981 Dependence on supplemental oxygen: Secondary | ICD-10-CM | POA: Diagnosis not present

## 2017-08-09 DIAGNOSIS — R52 Pain, unspecified: Secondary | ICD-10-CM

## 2017-08-09 DIAGNOSIS — Z720 Tobacco use: Secondary | ICD-10-CM | POA: Diagnosis not present

## 2017-08-09 DIAGNOSIS — J9621 Acute and chronic respiratory failure with hypoxia: Secondary | ICD-10-CM

## 2017-08-09 DIAGNOSIS — I10 Essential (primary) hypertension: Secondary | ICD-10-CM | POA: Diagnosis not present

## 2017-08-09 DIAGNOSIS — I2729 Other secondary pulmonary hypertension: Secondary | ICD-10-CM | POA: Diagnosis present

## 2017-08-09 DIAGNOSIS — J189 Pneumonia, unspecified organism: Secondary | ICD-10-CM | POA: Diagnosis present

## 2017-08-09 DIAGNOSIS — R6521 Severe sepsis with septic shock: Secondary | ICD-10-CM | POA: Diagnosis not present

## 2017-08-09 DIAGNOSIS — R000 Tachycardia, unspecified: Secondary | ICD-10-CM | POA: Diagnosis present

## 2017-08-09 DIAGNOSIS — I5032 Chronic diastolic (congestive) heart failure: Secondary | ICD-10-CM

## 2017-08-09 DIAGNOSIS — Z8249 Family history of ischemic heart disease and other diseases of the circulatory system: Secondary | ICD-10-CM | POA: Diagnosis not present

## 2017-08-09 DIAGNOSIS — Z9889 Other specified postprocedural states: Secondary | ICD-10-CM

## 2017-08-09 DIAGNOSIS — N189 Chronic kidney disease, unspecified: Secondary | ICD-10-CM | POA: Diagnosis present

## 2017-08-09 DIAGNOSIS — Z833 Family history of diabetes mellitus: Secondary | ICD-10-CM | POA: Diagnosis not present

## 2017-08-09 DIAGNOSIS — K7200 Acute and subacute hepatic failure without coma: Secondary | ICD-10-CM | POA: Diagnosis present

## 2017-08-09 DIAGNOSIS — G9341 Metabolic encephalopathy: Secondary | ICD-10-CM

## 2017-08-09 DIAGNOSIS — S89392D Other physeal fracture of lower end of left fibula, subsequent encounter for fracture with routine healing: Secondary | ICD-10-CM | POA: Diagnosis not present

## 2017-08-09 DIAGNOSIS — R768 Other specified abnormal immunological findings in serum: Secondary | ICD-10-CM | POA: Diagnosis not present

## 2017-08-09 DIAGNOSIS — S93439A Sprain of tibiofibular ligament of unspecified ankle, initial encounter: Secondary | ICD-10-CM | POA: Diagnosis present

## 2017-08-09 DIAGNOSIS — S82892A Other fracture of left lower leg, initial encounter for closed fracture: Secondary | ICD-10-CM | POA: Diagnosis not present

## 2017-08-09 DIAGNOSIS — R7989 Other specified abnormal findings of blood chemistry: Secondary | ICD-10-CM

## 2017-08-09 DIAGNOSIS — G8918 Other acute postprocedural pain: Secondary | ICD-10-CM | POA: Diagnosis not present

## 2017-08-09 DIAGNOSIS — F17210 Nicotine dependence, cigarettes, uncomplicated: Secondary | ICD-10-CM | POA: Diagnosis present

## 2017-08-09 DIAGNOSIS — D696 Thrombocytopenia, unspecified: Secondary | ICD-10-CM | POA: Diagnosis present

## 2017-08-09 DIAGNOSIS — S8262XA Displaced fracture of lateral malleolus of left fibula, initial encounter for closed fracture: Secondary | ICD-10-CM | POA: Diagnosis not present

## 2017-08-09 DIAGNOSIS — R945 Abnormal results of liver function studies: Secondary | ICD-10-CM | POA: Diagnosis not present

## 2017-08-09 DIAGNOSIS — S9302XA Subluxation of left ankle joint, initial encounter: Secondary | ICD-10-CM | POA: Diagnosis not present

## 2017-08-09 DIAGNOSIS — S0990XA Unspecified injury of head, initial encounter: Secondary | ICD-10-CM | POA: Diagnosis not present

## 2017-08-09 DIAGNOSIS — R778 Other specified abnormalities of plasma proteins: Secondary | ICD-10-CM

## 2017-08-09 DIAGNOSIS — R7401 Elevation of levels of liver transaminase levels: Secondary | ICD-10-CM

## 2017-08-09 DIAGNOSIS — I2510 Atherosclerotic heart disease of native coronary artery without angina pectoris: Secondary | ICD-10-CM | POA: Diagnosis present

## 2017-08-09 DIAGNOSIS — E785 Hyperlipidemia, unspecified: Secondary | ICD-10-CM | POA: Diagnosis present

## 2017-08-09 HISTORY — DX: Chronic obstructive pulmonary disease, unspecified: J44.9

## 2017-08-09 HISTORY — DX: Unspecified diastolic (congestive) heart failure: I50.30

## 2017-08-09 LAB — BRAIN NATRIURETIC PEPTIDE: B Natriuretic Peptide: 2139.4 pg/mL — ABNORMAL HIGH (ref 0.0–100.0)

## 2017-08-09 LAB — URINALYSIS, ROUTINE W REFLEX MICROSCOPIC
BILIRUBIN URINE: NEGATIVE
Glucose, UA: NEGATIVE mg/dL
Ketones, ur: NEGATIVE mg/dL
NITRITE: NEGATIVE
PH: 5 (ref 5.0–8.0)
Protein, ur: NEGATIVE mg/dL
SPECIFIC GRAVITY, URINE: 1.016 (ref 1.005–1.030)
Squamous Epithelial / LPF: NONE SEEN

## 2017-08-09 LAB — COMPREHENSIVE METABOLIC PANEL
ALK PHOS: 149 U/L — AB (ref 38–126)
ALT: 405 U/L — ABNORMAL HIGH (ref 14–54)
ANION GAP: 15 (ref 5–15)
AST: 482 U/L — ABNORMAL HIGH (ref 15–41)
Albumin: 3.5 g/dL (ref 3.5–5.0)
BILIRUBIN TOTAL: 2.7 mg/dL — AB (ref 0.3–1.2)
BUN: 81 mg/dL — ABNORMAL HIGH (ref 6–20)
CALCIUM: 9.9 mg/dL (ref 8.9–10.3)
CO2: 21 mmol/L — ABNORMAL LOW (ref 22–32)
Chloride: 103 mmol/L (ref 101–111)
Creatinine, Ser: 2.45 mg/dL — ABNORMAL HIGH (ref 0.44–1.00)
GFR calc non Af Amer: 21 mL/min — ABNORMAL LOW (ref >60)
GFR, EST AFRICAN AMERICAN: 24 mL/min — AB (ref >60)
Glucose, Bld: 104 mg/dL — ABNORMAL HIGH (ref 65–99)
Potassium: 4.7 mmol/L (ref 3.5–5.1)
Sodium: 139 mmol/L (ref 135–145)
TOTAL PROTEIN: 6.9 g/dL (ref 6.5–8.1)

## 2017-08-09 LAB — CBC WITH DIFFERENTIAL/PLATELET
BASOS PCT: 0 %
Basophils Absolute: 0 10*3/uL (ref 0.0–0.1)
EOS ABS: 0 10*3/uL (ref 0.0–0.7)
Eosinophils Relative: 0 %
HEMATOCRIT: 37 % (ref 36.0–46.0)
HEMOGLOBIN: 11.3 g/dL — AB (ref 12.0–15.0)
LYMPHS ABS: 1.3 10*3/uL (ref 0.7–4.0)
LYMPHS PCT: 8 %
MCH: 21.8 pg — ABNORMAL LOW (ref 26.0–34.0)
MCHC: 30.5 g/dL (ref 30.0–36.0)
MCV: 71.4 fL — ABNORMAL LOW (ref 78.0–100.0)
MONOS PCT: 10 %
Monocytes Absolute: 1.6 10*3/uL — ABNORMAL HIGH (ref 0.1–1.0)
NEUTROS ABS: 13.4 10*3/uL — AB (ref 1.7–7.7)
Neutrophils Relative %: 82 %
Platelets: 163 10*3/uL (ref 150–400)
RBC: 5.18 MIL/uL — ABNORMAL HIGH (ref 3.87–5.11)
RDW: 20.2 % — ABNORMAL HIGH (ref 11.5–15.5)
WBC: 16.3 10*3/uL — ABNORMAL HIGH (ref 4.0–10.5)

## 2017-08-09 LAB — COOXEMETRY PANEL
Carboxyhemoglobin: 4.6 % — ABNORMAL HIGH (ref 0.5–1.5)
Methemoglobin: 1 % (ref 0.0–1.5)
O2 SAT: 98.9 %
Total hemoglobin: 11.5 g/dL — ABNORMAL LOW (ref 12.0–16.0)

## 2017-08-09 LAB — I-STAT VENOUS BLOOD GAS, ED
Acid-base deficit: 2 mmol/L (ref 0.0–2.0)
BICARBONATE: 25.4 mmol/L (ref 20.0–28.0)
O2 Saturation: 98 %
PH VEN: 7.273 (ref 7.250–7.430)
TCO2: 27 mmol/L (ref 22–32)
pCO2, Ven: 54.9 mmHg (ref 44.0–60.0)
pO2, Ven: 113 mmHg — ABNORMAL HIGH (ref 32.0–45.0)

## 2017-08-09 LAB — PROTIME-INR
INR: 1.73
Prothrombin Time: 20.1 seconds — ABNORMAL HIGH (ref 11.4–15.2)

## 2017-08-09 LAB — AMMONIA
AMMONIA: 54 umol/L — AB (ref 9–35)
Ammonia: 48 umol/L — ABNORMAL HIGH (ref 9–35)

## 2017-08-09 LAB — I-STAT TROPONIN, ED
TROPONIN I, POC: 1.11 ng/mL — AB (ref 0.00–0.08)
Troponin i, poc: 0.78 ng/mL (ref 0.00–0.08)

## 2017-08-09 LAB — ACETAMINOPHEN LEVEL: Acetaminophen (Tylenol), Serum: <10 ug/mL — ABNORMAL LOW (ref 10–30)

## 2017-08-09 LAB — CK: CK TOTAL: 183 U/L (ref 38–234)

## 2017-08-09 LAB — LACTIC ACID, PLASMA
LACTIC ACID, VENOUS: 2.2 mmol/L — AB (ref 0.5–1.9)
Lactic Acid, Venous: 2.8 mmol/L (ref 0.5–1.9)

## 2017-08-09 LAB — PROCALCITONIN: PROCALCITONIN: 0.52 ng/mL

## 2017-08-09 LAB — TROPONIN I
Troponin I: 1.13 ng/mL (ref <0.03)
Troponin I: 1.62 ng/mL (ref <0.03)

## 2017-08-09 LAB — CBG MONITORING, ED: Glucose-Capillary: 106 mg/dL — ABNORMAL HIGH (ref 65–99)

## 2017-08-09 LAB — MAGNESIUM: Magnesium: 2.5 mg/dL — ABNORMAL HIGH (ref 1.7–2.4)

## 2017-08-09 LAB — LIPASE, BLOOD: LIPASE: 57 U/L — AB (ref 11–51)

## 2017-08-09 LAB — SALICYLATE LEVEL

## 2017-08-09 LAB — STREP PNEUMONIAE URINARY ANTIGEN: Strep Pneumo Urinary Antigen: NEGATIVE

## 2017-08-09 LAB — I-STAT CG4 LACTIC ACID, ED
LACTIC ACID, VENOUS: 4.55 mmol/L — AB (ref 0.5–1.9)
LACTIC ACID, VENOUS: 2.89 mmol/L — AB (ref 0.5–1.9)

## 2017-08-09 LAB — PHOSPHORUS: Phosphorus: 3.3 mg/dL (ref 2.5–4.6)

## 2017-08-09 MED ORDER — SODIUM CHLORIDE 0.9 % IV SOLN
INTRAVENOUS | Status: DC
  Administered 2017-08-09 – 2017-08-10 (×2): via INTRAVENOUS

## 2017-08-09 MED ORDER — PIPERACILLIN-TAZOBACTAM 3.375 G IVPB 30 MIN
3.3750 g | Freq: Once | INTRAVENOUS | Status: AC
  Administered 2017-08-09: 3.375 g via INTRAVENOUS
  Filled 2017-08-09: qty 50

## 2017-08-09 MED ORDER — DULOXETINE HCL 30 MG PO CPEP
30.0000 mg | ORAL_CAPSULE | Freq: Every day | ORAL | Status: DC
  Administered 2017-08-10 – 2017-08-11 (×2): 30 mg via ORAL
  Filled 2017-08-09 (×2): qty 1

## 2017-08-09 MED ORDER — GABAPENTIN 400 MG PO CAPS: 800.0000 mg | ORAL_CAPSULE | Freq: Two times a day (BID) | ORAL | Status: DC

## 2017-08-09 MED ORDER — TRAZODONE HCL 100 MG PO TABS: 100.0000 mg | ORAL_TABLET | Freq: Every evening | ORAL | Status: DC | PRN

## 2017-08-09 MED ORDER — FLUTICASONE-UMECLIDIN-VILANT 100-62.5-25 MCG/INH IN AEPB
1.0000 | INHALATION_SPRAY | Freq: Every day | RESPIRATORY_TRACT | Status: DC
  Filled 2017-08-09: qty 1

## 2017-08-09 MED ORDER — FUROSEMIDE 10 MG/ML IJ SOLN
40.0000 mg | Freq: Once | INTRAMUSCULAR | Status: AC
  Administered 2017-08-09: 40 mg via INTRAVENOUS
  Filled 2017-08-09: qty 4

## 2017-08-09 MED ORDER — VANCOMYCIN HCL IN DEXTROSE 1-5 GM/200ML-% IV SOLN
1000.0000 mg | INTRAVENOUS | Status: DC
Start: 2017-08-10 — End: 2017-08-11
  Administered 2017-08-10 – 2017-08-11 (×2): 1000 mg via INTRAVENOUS
  Filled 2017-08-09 (×2): qty 200

## 2017-08-09 MED ORDER — METHOCARBAMOL 500 MG PO TABS
500.0000 mg | ORAL_TABLET | Freq: Three times a day (TID) | ORAL | Status: DC | PRN
  Administered 2017-08-11 (×2): 500 mg via ORAL
  Filled 2017-08-09 (×2): qty 1

## 2017-08-09 MED ORDER — UMECLIDINIUM BROMIDE 62.5 MCG/INH IN AEPB
1.0000 | INHALATION_SPRAY | Freq: Every day | RESPIRATORY_TRACT | Status: DC
  Administered 2017-08-10 – 2017-08-12 (×3): 1 via RESPIRATORY_TRACT
  Filled 2017-08-09: qty 7

## 2017-08-09 MED ORDER — ONDANSETRON HCL 4 MG PO TABS: 4.0000 mg | ORAL_TABLET | Freq: Four times a day (QID) | ORAL | Status: DC | PRN

## 2017-08-09 MED ORDER — SODIUM CHLORIDE 0.9 % IV BOLUS (SEPSIS)
1000.0000 mL | Freq: Once | INTRAVENOUS | Status: AC
  Administered 2017-08-09: 1000 mL via INTRAVENOUS

## 2017-08-09 MED ORDER — ONDANSETRON HCL 4 MG/2ML IJ SOLN: 4.0000 mg | Freq: Four times a day (QID) | INTRAMUSCULAR | Status: DC | PRN

## 2017-08-09 MED ORDER — TIOTROPIUM BROMIDE MONOHYDRATE 18 MCG IN CAPS: 18.0000 ug | ORAL_CAPSULE | Freq: Every day | RESPIRATORY_TRACT | Status: DC

## 2017-08-09 MED ORDER — FLUTICASONE FUROATE-VILANTEROL 100-25 MCG/INH IN AEPB
1.0000 | INHALATION_SPRAY | Freq: Every day | RESPIRATORY_TRACT | Status: DC
  Administered 2017-08-10 – 2017-08-12 (×3): 1 via RESPIRATORY_TRACT
  Filled 2017-08-09: qty 28

## 2017-08-09 MED ORDER — ACETAMINOPHEN 325 MG PO TABS
650.0000 mg | ORAL_TABLET | Freq: Four times a day (QID) | ORAL | Status: DC | PRN
  Administered 2017-08-10: 650 mg via ORAL
  Filled 2017-08-09: qty 2

## 2017-08-09 MED ORDER — ACETAMINOPHEN 650 MG RE SUPP: 650.0000 mg | Freq: Four times a day (QID) | RECTAL | Status: DC | PRN

## 2017-08-09 MED ORDER — BUSPIRONE HCL 10 MG PO TABS: 5.0000 mg | ORAL_TABLET | Freq: Two times a day (BID) | ORAL | Status: DC

## 2017-08-09 MED ORDER — PIPERACILLIN-TAZOBACTAM 3.375 G IVPB
3.3750 g | Freq: Three times a day (TID) | INTRAVENOUS | Status: DC
  Administered 2017-08-09 – 2017-08-11 (×6): 3.375 g via INTRAVENOUS
  Filled 2017-08-09 (×9): qty 50

## 2017-08-09 MED ORDER — HEPARIN SODIUM (PORCINE) 5000 UNIT/ML IJ SOLN
5000.0000 [IU] | Freq: Three times a day (TID) | INTRAMUSCULAR | Status: DC
  Administered 2017-08-09 – 2017-08-11 (×7): 5000 [IU] via SUBCUTANEOUS
  Filled 2017-08-09 (×7): qty 1

## 2017-08-09 MED ORDER — SIMVASTATIN 10 MG PO TABS
10.0000 mg | ORAL_TABLET | Freq: Every day | ORAL | Status: DC
  Administered 2017-08-10: 10 mg via ORAL
  Filled 2017-08-09 (×2): qty 1

## 2017-08-09 MED ORDER — PANTOPRAZOLE SODIUM 40 MG PO TBEC
40.0000 mg | DELAYED_RELEASE_TABLET | Freq: Every day | ORAL | Status: DC
  Administered 2017-08-10 – 2017-08-13 (×4): 40 mg via ORAL
  Filled 2017-08-09 (×4): qty 1

## 2017-08-09 MED ORDER — VANCOMYCIN HCL 10 G IV SOLR
1500.0000 mg | Freq: Once | INTRAVENOUS | Status: AC
  Administered 2017-08-09: 1500 mg via INTRAVENOUS
  Filled 2017-08-09: qty 1500

## 2017-08-09 MED ORDER — VANCOMYCIN HCL IN DEXTROSE 1-5 GM/200ML-% IV SOLN
1000.0000 mg | Freq: Once | INTRAVENOUS | Status: DC
  Filled 2017-08-09: qty 200

## 2017-08-09 NOTE — ED Notes (Signed)
Pt to CT

## 2017-08-09 NOTE — ED Notes (Signed)
Patient now on venti mask 7 L and  97% oxygen saturation

## 2017-08-09 NOTE — ED Notes (Signed)
Found patient with oxygen off and sitting at edge of the bed stating " I need to get out of here". Patient 68% on room air. Place on nonrebreather now at 99% admitting MD [facility] aware

## 2017-08-09 NOTE — ED Notes (Signed)
Patient transported to Ultrasound 

## 2017-08-09 NOTE — ED Notes (Signed)
Pt to US.

## 2017-08-09 NOTE — ED Notes (Signed)
Patient alert and asking to use bathroom

## 2017-08-09 NOTE — ED Triage Notes (Addendum)
Fell twice on sat  And her husband found her on floor yesterday, husband states  Today he came home and she was so weak that she could not dress herself   And not eating Has been hurting and weak,  Husband states she has been getting accupunture for the last week ,  And has not been eating, pts speech is slurreed

## 2017-08-09 NOTE — Progress Notes (Signed)
Pharmacy Antibiotic Note  Teresa Roberts is a 56 y.o. female admitted on 08/09/2017 with sepsis.  Pharmacy has been consulted for vancomycin and zosyn dosing. Pt is afebrile but WBC is elevated at 16.3. Lactic acid is elevated at 4.55. Scr is significantly elevated above baseline at 2.45.    Plan: Vancomycin 1500mg  IV x 1 then 1gm IV Q24H Zosyn 3.375gm IV Q8H (4 hr inf) F/u renal fxn, C&S, clinical status and trough at SS  Height: 5\' 6"  (167.6 cm) Weight: 192 lb 14.4 oz (87.5 kg) IBW/kg (Calculated) : 59.3  Temp (24hrs), Avg:98.5 F (36.9 C), Min:98.5 F (36.9 C), Max:98.5 F (36.9 C)   Recent Labs Lab 08/09/17 1010  LATICACIDVEN 4.55*    CrCl cannot be calculated (Patient's most recent lab result is older than the maximum 21 days allowed.).    Allergies  Allergen Reactions  . Citrus Hives  . Lisinopril Cough    Antimicrobials this admission: Vanc 9/25>> Zosyn 9/25>>  Dose adjustments this admission: N/A  Microbiology results: Pending  Thank you for allowing pharmacy to be a part of this patient's care.  Gresia Isidoro, Rande Lawman 08/09/2017 10:20 AM

## 2017-08-09 NOTE — ED Notes (Signed)
Attempted to call report

## 2017-08-09 NOTE — H&P (Addendum)
History and Physical    Teresa Roberts UYQ:[id number] DOB: [birth year] DOA: 08/09/2017  PCP: Jonathon Jordan, MD Patient coming from: Home  Chief Complaint: confusion  HPI: Teresa Roberts is a 55 y.o. female with medical history significant of hyperlipidemia, hypertension, PTSD, fibromyalgia, COPD.  Level V caveat applies this patient is in acute distress and completely altered.   Respiratory admission patient developed an imminent falls with gradual onset of weakness. She is also had progressive symptoms of urinary incontinence and frequency which is completely unusual for the patient. Been associated with progressive anorexia general malaise but denies any fevers, nausea, vomiting. No recent changes in medication regimen. On day of admission patient was completely confused and somnolent. Unable to obtain further history given patient's altered mental state, lack of family, and acuity of situation. Patient is critically ill.  ED Course: 1.5L NS bolus given per sepsis protocol. Started on vancomycin and Zosyn. A nonrebreather to treat hypoxemia.  Review of Systems: As per HPI otherwise all other systems reviewed and are negative  Ambulatory Status: No retractions.  Past Medical History:  Diagnosis Date  . COPD (chronic obstructive pulmonary disease) ([place])    O2 dependent  . cpap   . Depression    PTSD  . Diastolic CHF ([place])   . Fibromyalgia   . Hyperlipidemia   . Hypertension   . Migraines     Past Surgical History:  Procedure Laterality Date  . APPENDECTOMY    . CESAREAN SECTION    . RIGHT/LEFT HEART CATH AND CORONARY ANGIOGRAPHY N/A 01/27/2017   Procedure: Right/Left Heart Cath and Coronary Angiography;  Surgeon: Troy Sine, MD;  Location: [facility];  Service: Cardiovascular;  Laterality: N/A;    Social History   Social History  . Marital status: Married    Spouse name: N/A  . Number of children: N/A  . Years of education: N/A   Occupational History  . Not on  file.   Social History Main Topics  . Smoking status: Current Every Day Smoker    Packs/day: 1.00    Years: 39.00    Types: Cigarettes  . Smokeless tobacco: Current User     Comment: down to 0.5ppd 06/22/17  . Alcohol use 0.6 oz/week    1 Glasses of wine per week  . Drug use: No  . Sexual activity: Not on file   Other Topics Concern  . Not on file   Social History Narrative  . No narrative on file    Allergies  Allergen Reactions  . Citrus Hives  . Lisinopril Cough    Family History  Problem Relation Age of Onset  . Diabetes Mother   . Hypertension Mother   . CAD Mother   . Diabetes Father   . Lung cancer Father   . Diabetes Sister       Prior to Admission medications   Medication Sig Start Date End Date Taking? Authorizing Provider  Acidophilus Lactobacillus CAPS Take 2 capsules by mouth every morning.    [provider]  Biotin 1 MG CAPS Take 1 mg by mouth daily.    [provider]  busPIRone (BUSPAR) 10 MG tablet Take 5 mg by mouth 2 (two) times daily.    [provider]  docusate sodium (COLACE) 100 MG capsule Take 100 mg by mouth 3 (three) times daily.    [provider]  DULoxetine (CYMBALTA) 30 MG capsule  05/31/17   [provider]  DULoxetine (CYMBALTA) 30 MG capsule TAKE  ONE CAPSULE BY MOUTH EVERY DAY 06/29/17   Bo Merino, MD  fluticasone (FLONASE) 50 MCG/ACT nasal spray Place 2 sprays into both nostrils daily. 06/22/17   Parrett, Fonnie Mu, NP  Fluticasone-Umeclidin-Vilant (TRELEGY ELLIPTA) 100-62.5-25 MCG/INH AEPB Inhale 1 puff into the lungs daily. 02/15/17   Rigoberto Noel, MD  gabapentin (NEURONTIN) 300 MG capsule Take 800 mg by mouth 2 (two) times daily.     [provider]  hydrochlorothiazide (HYDRODIURIL) 25 MG tablet Take 1 tablet (25 mg total) by mouth daily. 01/29/17   Holley Raring, MD  metaxalone (SKELAXIN) 800 MG tablet Take 1 tablet (800 mg total) by mouth 3 (three) times daily as needed for  muscle spasms. 07/20/17   Bo Merino, MD  methocarbamol (ROBAXIN) 500 MG tablet Take 1 tablet (500 mg total) by mouth 3 (three) times daily. 06/15/17 09/13/17  Bo Merino, MD  omeprazole (PRILOSEC OTC) 20 MG tablet Take 40 mg by mouth daily.    [provider]  rizatriptan (MAXALT-MLT) 10 MG disintegrating tablet Take 10 mg by mouth as needed for migraine. May repeat in 2 hours if needed    [provider]  simvastatin (ZOCOR) 10 MG tablet Take 10 mg by mouth at bedtime.    [provider]  tiotropium (SPIRIVA) 18 MCG inhalation capsule Place 1 capsule (18 mcg total) into inhaler and inhale daily. 06/22/17   Parrett, Fonnie Mu, NP  traZODone (DESYREL) 100 MG tablet Take 100 mg by mouth at bedtime as needed for sleep.    [provider]  vitamin B-12 (CYANOCOBALAMIN) 100 MCG tablet Take 100 mcg by mouth daily.    [provider]    Physical Exam: Vitals:   08/09/17 1127 08/09/17 1145 08/09/17 1200 08/09/17 1230  BP: 116/83 122/88 119/82 121/88  Pulse: 78  78 78  Resp: (!) 22 (!) 23 (!) 26 (!) 25  Temp:      TempSrc:      SpO2: 100%  99% 100%  Weight:      Height:         General: Sleeping in bed. Healthy overall appearing. Eyes:  PERRL, EOMI, normal lids, iris ENT:  grossly normal hearing, lips & tongue, mmm Neck:  no LAD, masses or thyromegaly Cardiovascular:  RRR, no m/r/g. No LE edema.  Respiratory:  increased effort. On nonrebreather. Crackles in bases with diminished breath sounds throughout.  Abdomen:  soft, ntnd, NABS Skin:  no rash or induration seen on limited exam Musculoskeletal:  grossly normal tone BUE/BLE, good ROM, no bony abnormality Psychiatric: Alert and oriented 0. Follows very basic commands. Sleepy. Difficult to engage. Neurologic:  CN 2-12 grossly intact, moves all extremities in coordinated fashion, sensation intact  Labs on Admission: I have personally reviewed following labs and imaging  studies  CBC:  Recent Labs Lab 08/09/17 0955  WBC 16.3*  NEUTROABS 13.4*  HGB 11.3*  HCT 37.0  MCV 71.4*  PLT 914   Basic Metabolic Panel:  Recent Labs Lab 08/09/17 0955  NA 139  K 4.7  CL 103  CO2 21*  GLUCOSE 104*  BUN 81*  CREATININE 2.45*  CALCIUM 9.9   GFR: Estimated Creatinine Clearance: 28.9 mL/min (A) (by C-G formula based on SCr of 2.45 mg/dL (H)). Liver Function Tests:  Recent Labs Lab 08/09/17 0955  AST 482*  ALT 405*  ALKPHOS 149*  BILITOT 2.7*  PROT 6.9  ALBUMIN 3.5    Recent Labs Lab 08/09/17 0955  LIPASE 57*   No results  for input(s): AMMONIA in the last 168 hours. Coagulation Profile:  Recent Labs Lab 08/09/17 0955  INR 1.73   Cardiac Enzymes:  Recent Labs Lab 08/09/17 0955  CKTOTAL 183   BNP (last 3 results) No results for input(s): PROBNP in the last 8760 hours. HbA1C: No results for input(s): HGBA1C in the last 72 hours. CBG:  Recent Labs Lab 08/09/17 0946  GLUCAP 106*   Lipid Profile: No results for input(s): CHOL, HDL, LDLCALC, TRIG, CHOLHDL, LDLDIRECT in the last 72 hours. Thyroid Function Tests: No results for input(s): TSH, T4TOTAL, FREET4, T3FREE, THYROIDAB in the last 72 hours. Anemia Panel: No results for input(s): VITAMINB12, FOLATE, FERRITIN, TIBC, IRON, RETICCTPCT in the last 72 hours. Urine analysis:    Component Value Date/Time   COLORURINE YELLOW 08/09/2017 1039   APPEARANCEUR CLEAR 08/09/2017 1039   LABSPEC 1.016 08/09/2017 1039   PHURINE 5.0 08/09/2017 1039   GLUCOSEU NEGATIVE 08/09/2017 1039   HGBUR MODERATE (A) 08/09/2017 1039   BILIRUBINUR NEGATIVE 08/09/2017 1039   KETONESUR NEGATIVE 08/09/2017 1039   PROTEINUR NEGATIVE 08/09/2017 1039   UROBILINOGEN 1.0 09/12/2014 1952   NITRITE NEGATIVE 08/09/2017 1039   LEUKOCYTESUR MODERATE (A) 08/09/2017 1039    Creatinine Clearance: Estimated Creatinine Clearance: 28.9 mL/min (A) (by C-G formula based on SCr of 2.45 mg/dL (H)).  Sepsis  Labs: @LABRCNTIP (procalcitonin:4,lacticidven:4) )No results found for this or any previous visit (from the past 240 hour(s)).   Radiological Exams on Admission: Ct Head Wo Contrast  Result Date: 08/09/2017 CLINICAL DATA:  Altered level of consciousness, recent falls, initial encounter EXAM: CT HEAD WITHOUT CONTRAST TECHNIQUE: Contiguous axial images were obtained from the base of the skull through the vertex without intravenous contrast. COMPARISON:  None. FINDINGS: Brain: No evidence of acute infarction, hemorrhage, hydrocephalus, extra-axial collection or mass lesion/mass effect. Vascular: No hyperdense vessel or unexpected calcification. Skull: Normal. Negative for fracture or focal lesion. Sinuses/Orbits: No acute finding. Other: None. IMPRESSION: No acute intracranial abnormality noted. Electronically Signed   By: Inez Catalina M.D.   On: 08/09/2017 11:01   Dg Chest Portable 1 View  Result Date: 08/09/2017 CLINICAL DATA:  Hypoxia, tachycardia, altered mental status. EXAM: PORTABLE CHEST 1 VIEW COMPARISON:  01/26/2017 FINDINGS: Cardiomegaly with vascular congestion and bilateral lower lobe airspace opacities which could reflect edema or infection. Suspect small right effusion. No acute bony abnormality. IMPRESSION: Cardiomegaly with vascular congestion and bilateral lower lobe opacities, edema versus infection. Small right effusion. Electronically Signed   By: Rolm Baptise M.D.   On: 08/09/2017 10:26   US Abdomen Limited Ruq  Result Date: 08/09/2017 CLINICAL DATA:  Elevated liver enzymes. EXAM: ULTRASOUND ABDOMEN LIMITED RIGHT UPPER QUADRANT COMPARISON:  09/12/2014. FINDINGS: Gallbladder: No gallstones or wall thickening visualized. No sludge noted on today's exam. No sonographic Murphy sign noted by sonographer. Common bile duct: Diameter: 3.6 mm Liver: Increase echogenicity consistent fatty infiltration and/or hepatocellular disease. No focal hepatic abnormality identified. Portal vein is patent  on color Doppler imaging with normal direction of blood flow towards the liver. IMPRESSION: 1. Increased hepatic echogenicity consistent fatty infiltration or hepatocellular disease. Similar findings noted on prior exam. 2. No acute abnormality identified. No evidence of gallstones or sludge noted on today's exam. No biliary distention. Electronically Signed   By: Marcello Moores  Register   On: 08/09/2017 12:42     Assessment/Plan Active Problems:   Fibromyalgia   Migraine   COPD (chronic obstructive pulmonary disease) ([facility])   Tobacco abuse   Sepsis ([place])   Acute on chronic  respiratory failure with hypoxia ([facility])   Acute renal failure (ARF) ([facility])   Transaminitis   Acute on chronic diastolic CHF (congestive heart failure) ([facility])   Elevated troponin   Sepsis secondary to UTI ([place])   Acute metabolic encephalopathy    Sepsis: likely multifactorial including secondary to UTI and possible CAP. Some spur: Initiated in the ED with 1.5 L normal saline bolus given and started on vancomycin and Zosyn. - Continue sepsis protocol - Follow-up on  Cultures - Continue vancomycin and Zosyn - Admit to stepdown - Treatment of UTI and hypoxic respiratory failure outlined below   UTI: UA grossly abnormal. Recent complaints are to admission of urinary incontinence and frequency. - Antibiotics as above (narrow as cultures return and patient's condition stabilizes) - IVF - Follow-up urine culture  Acute on chronic hypoxic respiratory failure: Likely a combination of baseline respiratory failure from advanced COPD and decompensated congestive heart failure and possible infectious process/CAP. Patient initially with reported O2 saturations of 48%. Followed closely by Dr. Elsworth Soho for O2 dependent COPD. VBG at time of admission showing pH of 7.27, PCO2 54.9, PO2 113, bicarbonate 27. BNP 2139. Previous echo showing an EF of 65% and restrictive heart disease pattern noting grade 2 diastolic dysfunction. O2 saturations  improved to 100% on nonrebreather. Mentation slowly improving. Chest x-ray per my read showing diffuse vascular pulmonary congestion with new cardiomegaly compared to previous x-ray. No definitive infiltrate appreciated. Patient received 1.5 L normal saline bolus the pressures have remained stable. - Sepsis and CHF protocols - Antibiotix as above, f/u cultures - Follow up on blood and respiratory cultures - I/O, Daily wts - lasix 40mg  IV x1 (further dosing likely but will need to evaluate response to initial dose as pt is naive and based on improvement in overall sepsis condition) - continue supplemental O2 - Echo - continyue home Trelegy Ellipta, Spiriva  Elevated Troponin: likely secondary demand and hypoperfusion from sepsis/shock. Recent heart cath (02/2017) by Dr. Claiborne Billings showing non-obstructive CAD. EKG on my read is sinus and tachycardia w/ some ST depression in inferior leads but no true sign of ACS.  - Cards consulted - cycle trop - Tele - Continue ASA, Statin  Acute Renal failure: suspect from urosepsis and hypoperfusion. Cr 2.45. Baseline 0.85. Diuresis as above may actually worsen renal function before improving.  - IVF - Renal US and Renal consult if not improving in am.  Transaminitis: AST and ALT in the 400 range. Likely from sepsis and hypotensive shock to the liver.  - hepatitis panel ordered by PCP - CMP in am to trend. May see bump in values for 1 more day before starting to downtrend  HTN: hypotensive to normotensive - hold home HCTZ  AMS: likely from sepsis, hypoxemia, and mild uremia. Improving w/ O2, hydration, and ABX.  - Treatment as above.   Depression: - continue in the am cymbalta, Buspar,  Chronic MSK/Neuropathic pain: - continue neurontin and robaxin in am   HA: h/o migraines - hold maxalt for now  DVT prophylaxis: Hep  Code Status: full  Family Communication: none  Disposition Plan: pending improvement in sepsis   Consults called: cardiology   Admission status: SDU, inpt    Waldemar Dickens MD [facility]  If 7PM-7AM, please contact night-coverage [email or web address] Password [facility]  08/09/2017, 1:01 PM      Critical Care Time This patient is critically ill requiring frequent monitoring and support due to their life/organ threatening condition. This care involves a high complexity of medical  decision making. Greater than 70 minutes spent in direct patient care and coordination.

## 2017-08-09 NOTE — ED Notes (Signed)
Dr. Marily Memos aware of lactic 2.8

## 2017-08-09 NOTE — Consult Note (Signed)
Cardiology Consultation:   Patient ID: Teresa Roberts; [id number]; November 28, 1960   Admit date: 08/09/2017 Date of Consult: 08/09/2017  Primary Care Provider: Jonathon Jordan, MD Primary Cardiologist: New  Primary Electrophysiologist: n/a   Patient Profile:   Teresa Roberts is a 56 y.o. female with a hx of chronic diastolic CHF, moderate pulmonary HTN, fibromyalgia, oxygen dependent COPD, HTN, and HLD who is being seen today for the evaluation of elevated troponin, volume overload at the request of Dr. Marily Memos.   History of Present Illness:   Teresa Roberts is a 56 year old female with a past medical history of chronic diastolic CHF, moderate pulmonary HTN, fibromyalgia, oxygen dependent COPD, HTN, and HLD.  Over the past few days, her husband has noted that she has been progressively weak, intermittently confused. Today, she was using the bathroom and became lethargic to the point that she could not get off the toilet by herself. Her husband brought her to the ED. Pertinent admission labs - lactic acid 2.89, troponin 1.13, ammonia 54. EKG with NSR, right axis deviation, ST abnormalities in the inferior leads, consistent with prior EKG. Head CT without acute abnormalities. She was hypoxic, now on NRB.   She had a R/L heart cath in 01/2017 that showed 20% RCA stenosis, right heart with moderate PH (PVR calculated at 2.6 WU). Denies chest pain, palpitations, orthopnea and PND.     Past Medical History:  Diagnosis Date  . COPD (chronic obstructive pulmonary disease) ([place])    O2 dependent  . cpap   . Depression    PTSD  . Diastolic CHF ([place])   . Fibromyalgia   . Hyperlipidemia   . Hypertension   . Migraines     Past Surgical History:  Procedure Laterality Date  . APPENDECTOMY    . CESAREAN SECTION    . RIGHT/LEFT HEART CATH AND CORONARY ANGIOGRAPHY N/A 01/27/2017   Procedure: Right/Left Heart Cath and Coronary Angiography;  Surgeon: Troy Sine, MD;  Location: [facility];  Service:  Cardiovascular;  Laterality: N/A;       Inpatient Medications: Scheduled Meds: . [START ON 08/10/2017] busPIRone  5 mg Oral BID  . [START ON 08/10/2017] DULoxetine  30 mg Oral Daily  . Fluticasone-Umeclidin-Vilant  1 puff Inhalation Daily  . [START ON 08/10/2017] gabapentin  800 mg Oral BID  . heparin  5,000 Units Subcutaneous Q8H  . [START ON 08/10/2017] omeprazole  40 mg Oral Daily  . simvastatin  10 mg Oral QHS  . tiotropium  18 mcg Inhalation Daily   Continuous Infusions: . sodium chloride 50 mL/hr at 08/09/17 1327  . piperacillin-tazobactam (ZOSYN)  IV    . [START ON 08/10/2017] vancomycin     PRN Meds: acetaminophen **OR** acetaminophen, [START ON 08/10/2017] methocarbamol, ondansetron **OR** ondansetron (ZOFRAN) IV, traZODone  Allergies:    Allergies  Allergen Reactions  . Citrus Hives  . Lisinopril Cough    Social History:   Social History   Social History  . Marital status: Married    Spouse name: N/A  . Number of children: N/A  . Years of education: N/A   Occupational History  . Not on file.   Social History Main Topics  . Smoking status: Current Every Day Smoker    Packs/day: 1.00    Years: 39.00    Types: Cigarettes  . Smokeless tobacco: Current User     Comment: down to 0.5ppd 06/22/17  . Alcohol use 0.6 oz/week    1 Glasses of wine per  week  . Drug use: No  . Sexual activity: Not on file   Other Topics Concern  . Not on file   Social History Narrative  . No narrative on file    Family History:    Family History  Problem Relation Age of Onset  . Diabetes Mother   . Hypertension Mother   . CAD Mother   . Diabetes Father   . Lung cancer Father   . Diabetes Sister      ROS:  Please see the history of present illness.  ROS  All other ROS reviewed and negative.     Physical Exam/Data:   Vitals:   08/09/17 1445 08/09/17 1530 08/09/17 1600 08/09/17 1633  BP: 130/84 126/87 132/76   Pulse: 84 83 81   Resp: (!) 22 (!) 25 (!) 25   Temp:     98.3 F (36.8 C)  TempSrc:    Oral  SpO2: 100% 100% 100%   Weight:      Height:        Intake/Output Summary (Last 24 hours) at 08/09/17 1722 Last data filed at 08/09/17 1423  Gross per 24 hour  Intake             2050 ml  Output             1500 ml  Net              550 ml   Filed Weights   08/09/17 1000  Weight: 192 lb 14.4 oz (87.5 kg)   Body mass index is 31.14 kg/m.  General:  Ill appearing female.   HEENT: normal Lymph: no adenopathy Neck: JVP hard to assess, does appear elevated.  Endocrine:  No thryomegaly Vascular: No carotid bruits; FA pulses 2+ bilaterally without bruits  Cardiac:  normal S1, S2; RRR; no murmur Lungs:  clear to auscultation bilaterally, no wheezing, rhonchi or rales  Abd: soft, nontender, no hepatomegaly  Ext: no edema Musculoskeletal:  No deformities, BUE and BLE strength normal and equal Skin: warm and dry  Neuro:  CNs 2-12 intact, no focal abnormalities noted. Lethargic Psych:  Normal affect   EKG:  The EKG was personally reviewed and demonstrates:  NSR, right axis deviation.  Telemetry:  Telemetry was personally reviewed and demonstrates:  NSR   Relevant CV Studies: Transthoracic Echocardiography 01/2017 ------------------------------------------------------------------- Study Conclusions  - Left ventricle: The cavity size was normal. Wall thickness was   normal. Systolic function was normal. The estimated ejection   fraction was in the range of 60% to 65%. Wall motion was normal;   there were no regional wall motion abnormalities. Features are   consistent with a pseudonormal left ventricular filling pattern,   with concomitant abnormal relaxation and increased filling   pressure (grade 2 diastolic dysfunction). - Aortic valve: Mildly calcified annulus. Valve area (Vmax): 1.94   cm^2. Valve area (Vmean): 2.16 cm^2. - Right atrium: The atrium was mildly dilated.  Right/Left Heart Cath and Coronary Angiography 01/27/17  Mid RCA  lesion, 20 %stenosed.  The left ventricular ejection fraction is 55-65% by visual estimate.  The left ventricular systolic function is normal.  LV end diastolic pressure is normal.   Moderate right sided heart pressure elevation with moderate pulmonary hypertension.  Normal systolic function with moderate left ventricular hypertrophy with EF estimate of approximately 60%.  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     Laboratory Data:  Chemistry Recent Labs Lab 08/09/17 0955  NA 139  K 4.7  CL 103  CO2 21*  GLUCOSE 104*  BUN 81*  CREATININE 2.45*  CALCIUM 9.9  GFRNONAA 21*  GFRAA 24*  ANIONGAP 15     Recent Labs Lab 08/09/17 0955  PROT 6.9  ALBUMIN 3.5  AST 482*  ALT 405*  ALKPHOS 149*  BILITOT 2.7*   Hematology Recent Labs Lab 08/09/17 0955  WBC 16.3*  RBC 5.18*  HGB 11.3*  HCT 37.0  MCV 71.4*  MCH 21.8*  MCHC 30.5  RDW 20.2*  PLT 163   Cardiac Enzymes Recent Labs Lab 08/09/17 1259  TROPONINI 1.13*    Recent Labs Lab 08/09/17 1007 08/09/17 1318  TROPIPOC 0.78* 1.11*    BNP Recent Labs Lab 08/09/17 0956  BNP 2,139.4*    DDimer No results for input(s): DDIMER in the last 168 hours.  Radiology/Studies:  Ct Head Wo Contrast  Result Date: 08/09/2017 CLINICAL DATA:  Altered level of consciousness, recent falls, initial encounter EXAM: CT HEAD WITHOUT CONTRAST TECHNIQUE: Contiguous axial images were obtained from the base of the skull through the vertex without intravenous contrast. COMPARISON:  None. FINDINGS: Brain: No evidence of acute infarction, hemorrhage, hydrocephalus, extra-axial collection or mass lesion/mass effect. Vascular: No hyperdense vessel or unexpected calcification. Skull: Normal. Negative for fracture or focal lesion. Sinuses/Orbits: No acute finding. Other: None. IMPRESSION: No acute intracranial abnormality noted. Electronically Signed   By:  Inez Catalina M.D.   On: 08/09/2017 11:01   Dg Chest Portable 1 View  Result Date: 08/09/2017 CLINICAL DATA:  Hypoxia, tachycardia, altered mental status. EXAM: PORTABLE CHEST 1 VIEW COMPARISON:  01/26/2017 FINDINGS: Cardiomegaly with vascular congestion and bilateral lower lobe airspace opacities which could reflect edema or infection. Suspect small right effusion. No acute bony abnormality. IMPRESSION: Cardiomegaly with vascular congestion and bilateral lower lobe opacities, edema versus infection. Small right effusion. Electronically Signed   By: Rolm Baptise M.D.   On: 08/09/2017 10:26   US Abdomen Limited Ruq  Result Date: 08/09/2017 CLINICAL DATA:  Elevated liver enzymes. EXAM: ULTRASOUND ABDOMEN LIMITED RIGHT UPPER QUADRANT COMPARISON:  09/12/2014. FINDINGS: Gallbladder: No gallstones or wall thickening visualized. No sludge noted on today's exam. No sonographic Murphy sign noted by sonographer. Common bile duct: Diameter: 3.6 mm Liver: Increase echogenicity consistent fatty infiltration and/or hepatocellular disease. No focal hepatic abnormality identified. Portal vein is patent on color Doppler imaging with normal direction of blood flow towards the liver. IMPRESSION: 1. Increased hepatic echogenicity consistent fatty infiltration or hepatocellular disease. Similar findings noted on prior exam. 2. No acute abnormality identified. No evidence of gallstones or sludge noted on today's exam. No biliary distention. Electronically Signed   By: Marcello Moores  Register   On: 08/09/2017 12:42    Assessment and Plan:   1. Elevated troponin: Likely due to hypoxia and infectious process (questionable PNA on CXR and + UTI).  Recent heart cath with very minimal CAD. No need for repeat cath.  2. Chronic diastolic CHF:  - Volume overloaded on exam.  - Has gotten one dose of Lasix. Watch response. May need another dose, but I do not think this is her primary issue.   3. Moderate pulmonary HTN: WHO group II and  III due to underlying lung disease (severe COPD and diastolic CHF).  - Lasix if needed.  - Continue supplemental O2.   4. UTI - on antibiotics per primary  5. Acute renal failure - Suspect ATN from urosepsis.  - follow with  BM[facility].    For questions or updates, please contact [place] Please consult [email or web address] for contact info under Cardiology/STEMI.   Signed, Arbutus Leas, NP  08/09/2017 5:22 PM   History and all data above reviewed.  Patient examined.  I agree with the findings as above.  She presents with falls, weakness and is found to have an elevated troponin.  She has multiple lab abnormalities including acute renal insufficiency, elevated liver enzymes, UTI, elevated WBC and lactic acid.  She does not have any cardiac complaints. EKG has chronic changes. Of note she had recent mild plaque on cardiac cath earlier this year.   She denies chest pain.  She does have DOE and infiltrates on CXR with possible edema vs pneumonia.  The patient exam reveals COR:RRR  ,  Lungs: Decreased breath sounds  ,  Abd: Positive bowel sounds, no rebound no guarding, Ext no edema  .  All available labs, radiology testing, previous records reviewed. Agree with documented assessment and plan.   ELEVATED TROPONIN:  This is non specific and possibly demand ischemia in this situation.  She does have a significantly elevated BNP and so I suspect some component of likely diastolic dysfunction.  She was given diuresis in the ED.  Repeat an echo given this and we will follow volume.        Jeneen Rinks Kauri Garson  5:51 PM  08/09/2017

## 2017-08-09 NOTE — ED Notes (Signed)
Changed out suction for pure wick, 600 ml output document-able, had some accidents earlier that we were not able to get measured.  Urine normal yellow in color, no sediment, no foul odor.

## 2017-08-09 NOTE — ED Notes (Signed)
EDP at bedisde

## 2017-08-09 NOTE — ED Notes (Signed)
Assisted patient with use of the bedpan

## 2017-08-09 NOTE — Progress Notes (Signed)
Placed pt on 8L [place] Mask per RN request. Pt mouth breathing with spo2 only 93% on [address]L [address]. Pt in no distress, no increased WOB. RT will continue to closely monitor pt

## 2017-08-09 NOTE — ED Provider Notes (Signed)
[facility] DEPT Provider Note   CSN: [id number] Arrival date & time: 08/09/17  0909     History   Chief Complaint Chief Complaint  Patient presents with  . Weakness    HPI Teresa Roberts is a 56 y.o. female.  The history is provided by the EMS personnel, medical records and a relative. The history is limited by the condition of the patient.  Altered Mental Status   This is a new problem. The current episode started yesterday. The problem has not changed since onset.Associated symptoms include confusion, somnolence and unresponsiveness. Her past medical history is significant for hypertension.   LVL 5 Caveat for AMS  Past Medical History:  Diagnosis Date  . cpap   . Depression    PTSD  . Fibromyalgia   . Hyperlipidemia   . Hypertension   . Migraines     Patient Active Problem List   Diagnosis Date Noted  . Other fatigue 06/13/2017  . Primary insomnia 06/13/2017  . History of migraine 06/13/2017  . COPD (chronic obstructive pulmonary disease) ([place]) 02/15/2017  . Tobacco abuse 02/15/2017  . Respiratory failure with hypoxia ([place]) 01/28/2017  . Migraine 01/28/2017  . Daily headache   . Hypoxia   . Shortness of breath 01/26/2017  . Non-ST elevation myocardial infarction (NSTEMI), type 2 01/26/2017  . MGUS (monoclonal gammopathy of unknown significance) 01/11/2012  . Fibromyalgia 01/11/2012  . Bruises easily 01/11/2012    Past Surgical History:  Procedure Laterality Date  . APPENDECTOMY    . CESAREAN SECTION    . RIGHT/LEFT HEART CATH AND CORONARY ANGIOGRAPHY N/A 01/27/2017   Procedure: Right/Left Heart Cath and Coronary Angiography;  Surgeon: Troy Sine, MD;  Location: [facility];  Service: Cardiovascular;  Laterality: N/A;    OB History    No data available       Home Medications    Prior to Admission medications   Medication Sig Start Date End Date Taking? Authorizing Provider  Acidophilus Lactobacillus CAPS Take 2 capsules by mouth every  morning.    [provider]  Biotin 1 MG CAPS Take 1 mg by mouth daily.    [provider]  busPIRone (BUSPAR) 10 MG tablet Take 5 mg by mouth 2 (two) times daily.    [provider]  docusate sodium (COLACE) 100 MG capsule Take 100 mg by mouth 3 (three) times daily.    [provider]  DULoxetine (CYMBALTA) 30 MG capsule  05/31/17   [provider]  DULoxetine (CYMBALTA) 30 MG capsule TAKE ONE CAPSULE BY MOUTH EVERY DAY 06/29/17   Bo Merino, MD  fluticasone (FLONASE) 50 MCG/ACT nasal spray Place 2 sprays into both nostrils daily. 06/22/17   Parrett, Fonnie Mu, NP  Fluticasone-Umeclidin-Vilant (TRELEGY ELLIPTA) 100-62.5-25 MCG/INH AEPB Inhale 1 puff into the lungs daily. 02/15/17   Rigoberto Noel, MD  gabapentin (NEURONTIN) 300 MG capsule Take 800 mg by mouth 2 (two) times daily.     [provider]  hydrochlorothiazide (HYDRODIURIL) 25 MG tablet Take 1 tablet (25 mg total) by mouth daily. 01/29/17   Holley Raring, MD  metaxalone (SKELAXIN) 800 MG tablet Take 1 tablet (800 mg total) by mouth 3 (three) times daily as needed for muscle spasms. 07/20/17   Bo Merino, MD  methocarbamol (ROBAXIN) 500 MG tablet Take 1 tablet (500 mg total) by mouth 3 (three) times daily. 06/15/17 09/13/17  Bo Merino, MD  omeprazole (PRILOSEC OTC) 20 MG tablet Take 40 mg by mouth daily.  [provider]  rizatriptan (MAXALT-MLT) 10 MG disintegrating tablet Take 10 mg by mouth as needed for migraine. May repeat in 2 hours if needed    [provider]  simvastatin (ZOCOR) 10 MG tablet Take 10 mg by mouth at bedtime.    [provider]  tiotropium (SPIRIVA) 18 MCG inhalation capsule Place 1 capsule (18 mcg total) into inhaler and inhale daily. 06/22/17   Parrett, Fonnie Mu, NP  traZODone (DESYREL) 100 MG tablet Take 100 mg by mouth at bedtime as needed for sleep.    [provider]  vitamin B-12 (CYANOCOBALAMIN) 100 MCG tablet  Take 100 mcg by mouth daily.    [provider]    Family History Family History  Problem Relation Age of Onset  . Diabetes Mother   . Hypertension Mother   . CAD Mother   . Diabetes Father   . Lung cancer Father   . Diabetes Sister     Social History Social History  Substance Use Topics  . Smoking status: Current Every Day Smoker    Packs/day: 1.00    Years: 39.00    Types: Cigarettes  . Smokeless tobacco: Current User     Comment: down to 0.5ppd 06/22/17  . Alcohol use 0.6 oz/week    1 Glasses of wine per week     Allergies   Citrus and Lisinopril   Review of Systems Review of Systems  Unable to perform ROS: Mental status change  Psychiatric/Behavioral: Positive for confusion.     Physical Exam Updated Vital Signs BP 117/74   Pulse 93   Temp 98.5 F (36.9 C) (Oral)   Resp 16   LMP 08/19/2012 Comment: reviewed by Baxter Flattery Dingus RT-T  SpO2 91%   Physical Exam  Constitutional: She appears well-developed and well-nourished. She appears distressed.  HENT:  Head: Normocephalic.  Mouth/Throat: Oropharynx is clear and moist. No oropharyngeal exudate.  Eyes: Pupils are equal, round, and reactive to light. Conjunctivae and EOM are normal.  Neck: Normal range of motion.  Cardiovascular: Intact distal pulses.  Tachycardia present.   No murmur heard. Pulmonary/Chest: No stridor. Tachypnea noted. No respiratory distress. She has wheezes. She has rhonchi. She has rales. She exhibits no tenderness.  Abdominal: She exhibits no distension. There is tenderness (mild).  Neurological: No sensory deficit. She exhibits normal muscle tone. GCS eye subscore is 4. GCS verbal subscore is 3. GCS motor subscore is 6.  Skin: Capillary refill takes less than 2 seconds. No rash noted. She is not diaphoretic. No erythema.  Nursing note and vitals reviewed.    ED Treatments / Results  Labs (all labs ordered are listed, but only abnormal results are displayed) Labs Reviewed    URINE CULTURE - Abnormal; Notable for the following:       Result Value   Culture MULTIPLE SPECIES PRESENT, SUGGEST RECOLLECTION (*)    All other components within normal limits  CBC WITH DIFFERENTIAL/PLATELET - Abnormal; Notable for the following:    WBC 16.3 (*)    RBC 5.18 (*)    Hemoglobin 11.3 (*)    MCV 71.4 (*)    MCH 21.8 (*)    RDW 20.2 (*)    Neutro Abs 13.4 (*)    Monocytes Absolute 1.6 (*)    All other components within normal limits  COMPREHENSIVE METABOLIC PANEL - Abnormal; Notable for the following:    CO2 21 (*)    Glucose, Bld 104 (*)    BUN 81 (*)  Creatinine, Ser 2.45 (*)    AST 482 (*)    ALT 405 (*)    Alkaline Phosphatase 149 (*)    Total Bilirubin 2.7 (*)    GFR calc non Af Amer 21 (*)    GFR calc Af Amer 24 (*)    All other components within normal limits  LIPASE, BLOOD - Abnormal; Notable for the following:    Lipase 57 (*)    All other components within normal limits  PROTIME-INR - Abnormal; Notable for the following:    Prothrombin Time 20.1 (*)    All other components within normal limits  COOXEMETRY PANEL - Abnormal; Notable for the following:    Total hemoglobin 11.5 (*)    Carboxyhemoglobin 4.6 (*)    All other components within normal limits  URINALYSIS, ROUTINE W REFLEX MICROSCOPIC - Abnormal; Notable for the following:    Hgb urine dipstick MODERATE (*)    Leukocytes, UA MODERATE (*)    Bacteria, UA MANY (*)    All other components within normal limits  BRAIN NATRIURETIC PEPTIDE - Abnormal; Notable for the following:    B Natriuretic Peptide 2,139.4 (*)    All other components within normal limits  ACETAMINOPHEN LEVEL - Abnormal; Notable for the following:    Acetaminophen (Tylenol), Serum <10 (*)    All other components within normal limits  AMMONIA - Abnormal; Notable for the following:    Ammonia 54 (*)    All other components within normal limits  AMMONIA - Abnormal; Notable for the following:    Ammonia 48 (*)    All other  components within normal limits  LACTIC ACID, PLASMA - Abnormal; Notable for the following:    Lactic Acid, Venous 2.8 (*)    All other components within normal limits  LACTIC ACID, PLASMA - Abnormal; Notable for the following:    Lactic Acid, Venous 2.2 (*)    All other components within normal limits  MAGNESIUM - Abnormal; Notable for the following:    Magnesium 2.5 (*)    All other components within normal limits  TROPONIN I - Abnormal; Notable for the following:    Troponin I 1.13 (*)    All other components within normal limits  TROPONIN I - Abnormal; Notable for the following:    Troponin I 1.62 (*)    All other components within normal limits  TROPONIN I - Abnormal; Notable for the following:    Troponin I 1.67 (*)    All other components within normal limits  CBC - Abnormal; Notable for the following:    WBC 11.8 (*)    Hemoglobin 10.0 (*)    HCT 33.8 (*)    MCV 71.2 (*)    MCH 21.1 (*)    MCHC 29.6 (*)    RDW 19.8 (*)    Platelets 116 (*)    All other components within normal limits  COMPREHENSIVE METABOLIC PANEL - Abnormal; Notable for the following:    Potassium 3.1 (*)    BUN 48 (*)    Creatinine, Ser 1.34 (*)    Total Protein 5.9 (*)    Albumin 3.0 (*)    AST 904 (*)    ALT 793 (*)    Alkaline Phosphatase 135 (*)    Total Bilirubin 2.1 (*)    GFR calc non Af Amer 44 (*)    GFR calc Af Amer 51 (*)    All other components within normal limits  CBG MONITORING, ED - Abnormal; Notable  for the following:    Glucose-Capillary 106 (*)    All other components within normal limits  I-STAT CG4 LACTIC ACID, ED - Abnormal; Notable for the following:    Lactic Acid, Venous 4.55 (*)    All other components within normal limits  I-STAT TROPONIN, ED - Abnormal; Notable for the following:    Troponin i, poc 0.78 (*)    All other components within normal limits  I-STAT VENOUS BLOOD GAS, ED - Abnormal; Notable for the following:    pO2, Ven 113.0 (*)    All other  components within normal limits  I-STAT CG4 LACTIC ACID, ED - Abnormal; Notable for the following:    Lactic Acid, Venous 2.89 (*)    All other components within normal limits  I-STAT TROPONIN, ED - Abnormal; Notable for the following:    Troponin i, poc 1.11 (*)    All other components within normal limits  CULTURE, BLOOD (ROUTINE X 2)  CULTURE, BLOOD (ROUTINE X 2)  MRSA PCR SCREENING  CULTURE, EXPECTORATED SPUTUM-ASSESSMENT  GRAM STAIN  URINE CULTURE  CK  SALICYLATE LEVEL  HEPATITIS PANEL, ACUTE  HIV ANTIBODY (ROUTINE TESTING)  PROCALCITONIN  STREP PNEUMONIAE URINARY ANTIGEN  PHOSPHORUS  LEGIONELLA PNEUMOPHILA SEROGP 1 UR AG  LACTIC ACID, PLASMA  BLOOD GAS, VENOUS  CBC WITH DIFFERENTIAL/PLATELET  COMPREHENSIVE METABOLIC PANEL  MAGNESIUM    EKG  EKG Interpretation  Date/Time:  Tuesday August 09 2017 09:23:57 EDT Ventricular Rate:  97 PR Interval:  138 QRS Duration: 82 QT Interval:  320 QTC Calculation: 406 R Axis:   108 Text Interpretation:  Sinus rhythm with occasional Premature ventricular complexes Rightward axis ST & T wave abnormality, consider inferior ischemia Abnormal ECG When compared to prior, new S wave in lead 1, similar t wave inversions to prior.  No STEMI Confirmed by Antony Blackbird [phone]) on 08/09/2017 9:51:10 AM       Radiology Dg Chest 2 View  Result Date: 08/10/2017 CLINICAL DATA:  Shortness of breath. History of stepped cysts and CHF. Also COPD current smoker. EXAM: CHEST  2 VIEW COMPARISON:  Portable chest x-ray of August 09, 2017 FINDINGS: The lungs are adequately inflated. There is bilateral hilar prominence which is stable. The pulmonary vascularity appears engorged with some cephalization. The interstitial markings are less prominent today. There remain increased densities lateral to the left heart border. IMPRESSION: Slight interval improvement in the appearance of the chest with decreased interstitial edema. Persistent bilateral hilar  prominence may reflect the congested pulmonary vascularity though underlying lymphadenopathy is not excluded. Electronically Signed   By: David  Martinique M.D.   On: 08/10/2017 07:50   Dg Ankle 2 Views Left  Result Date: 08/10/2017 CLINICAL DATA:  56 year old who fell 5 days ago. Persistent left ankle pain. Initial encounter. EXAM: LEFT ANKLE - 2 VIEW COMPARISON:  None. FINDINGS: Lateral soft tissue swelling. Comminuted mildly displaced oblique fracture involving the distal fibula. Tiny avulsion fracture arising from the medial malleolus. Slight lateral subluxation of the talus relative to the tibial plafond. IMPRESSION: 1. Comminuted mildly displaced oblique fracture involving the distal fibula. 2. Tiny avulsion fracture arising from the medial malleolus. 3. Slight lateral subluxation of the talus relative to the tibia. Electronically Signed   By: Evangeline Dakin M.D.   On: 08/10/2017 18:46   Ct Head Wo Contrast  Result Date: 08/09/2017 CLINICAL DATA:  Altered level of consciousness, recent falls, initial encounter EXAM: CT HEAD WITHOUT CONTRAST TECHNIQUE: Contiguous axial images were obtained from the base of  the skull through the vertex without intravenous contrast. COMPARISON:  None. FINDINGS: Brain: No evidence of acute infarction, hemorrhage, hydrocephalus, extra-axial collection or mass lesion/mass effect. Vascular: No hyperdense vessel or unexpected calcification. Skull: Normal. Negative for fracture or focal lesion. Sinuses/Orbits: No acute finding. Other: None. IMPRESSION: No acute intracranial abnormality noted. Electronically Signed   By: Inez Catalina M.D.   On: 08/09/2017 11:01   Dg Chest Portable 1 View  Result Date: 08/09/2017 CLINICAL DATA:  Hypoxia, tachycardia, altered mental status. EXAM: PORTABLE CHEST 1 VIEW COMPARISON:  01/26/2017 FINDINGS: Cardiomegaly with vascular congestion and bilateral lower lobe airspace opacities which could reflect edema or infection. Suspect small right  effusion. No acute bony abnormality. IMPRESSION: Cardiomegaly with vascular congestion and bilateral lower lobe opacities, edema versus infection. Small right effusion. Electronically Signed   By: Rolm Baptise M.D.   On: 08/09/2017 10:26   US Abdomen Limited Ruq  Result Date: 08/09/2017 CLINICAL DATA:  Elevated liver enzymes. EXAM: ULTRASOUND ABDOMEN LIMITED RIGHT UPPER QUADRANT COMPARISON:  09/12/2014. FINDINGS: Gallbladder: No gallstones or wall thickening visualized. No sludge noted on today's exam. No sonographic Murphy sign noted by sonographer. Common bile duct: Diameter: 3.6 mm Liver: Increase echogenicity consistent fatty infiltration and/or hepatocellular disease. No focal hepatic abnormality identified. Portal vein is patent on color Doppler imaging with normal direction of blood flow towards the liver. IMPRESSION: 1. Increased hepatic echogenicity consistent fatty infiltration or hepatocellular disease. Similar findings noted on prior exam. 2. No acute abnormality identified. No evidence of gallstones or sludge noted on today's exam. No biliary distention. Electronically Signed   By: Marcello Moores  Register   On: 08/09/2017 12:42    Procedures Procedures (including critical care time)  CRITICAL CARE Performed by: Gwenyth Allegra Haja Crego Total critical care time: 45 minutes Critical care time was exclusive of separately billable procedures and treating other patients. Critical care was necessary to treat or prevent imminent or life-threatening deterioration. Critical care was time spent personally by me on the following activities: development of treatment plan with patient and/or surrogate as well as nursing, discussions with consultants, evaluation of patient's response to treatment, examination of patient, obtaining history from patient or surrogate, ordering and performing treatments and interventions, ordering and review of laboratory studies, ordering and review of radiographic studies, pulse  oximetry and re-evaluation of patient's condition.   Medications Ordered in ED Medications  piperacillin-tazobactam (ZOSYN) IVPB 3.375 g (3.375 g Intravenous New Bag/Given 08/10/17 1430)  vancomycin (VANCOCIN) IVPB 1000 mg/200 mL premix (0 mg Intravenous Stopped 08/10/17 1300)  DULoxetine (CYMBALTA) DR capsule 30 mg (30 mg Oral Given 08/10/17 0942)  traZODone (DESYREL) tablet 100 mg (not administered)  pantoprazole (PROTONIX) EC tablet 40 mg (40 mg Oral Given 08/10/17 0943)  methocarbamol (ROBAXIN) tablet 500 mg (not administered)  heparin injection 5,000 Units (5,000 Units Subcutaneous Given 08/10/17 1400)  0.9 %  sodium chloride infusion ( Intravenous New Bag/Given 08/09/17 1327)  acetaminophen (TYLENOL) tablet 650 mg (650 mg Oral Given 08/10/17 1327)    Or  acetaminophen (TYLENOL) suppository 650 mg ( Rectal See Alternative 08/10/17 1327)  ondansetron (ZOFRAN) tablet 4 mg (not administered)    Or  ondansetron (ZOFRAN) injection 4 mg (not administered)  fluticasone furoate-vilanterol (BREO ELLIPTA) 100-25 MCG/INH 1 puff (1 puff Inhalation Given 08/10/17 0757)    And  umeclidinium bromide (INCRUSE ELLIPTA) 62.5 MCG/INH 1 puff (1 puff Inhalation Given 08/10/17 0757)  MEDLINE mouth rinse (15 mLs Mouth Rinse Given 08/10/17 0414)  methylPREDNISolone sodium succinate (SOLU-MEDROL) 40 mg/mL injection 40  mg (40 mg Intravenous Given 08/10/17 1115)  ipratropium-albuterol (DUONEB) 0.5-2.5 (3) MG/3ML nebulizer solution 3 mL (not administered)  traMADol (ULTRAM) tablet 50 mg (50 mg Oral Given 08/10/17 1652)  sodium chloride 0.9 % bolus 1,000 mL (0 mLs Intravenous Stopped 08/09/17 1126)  sodium chloride 0.9 % bolus 1,000 mL (0 mLs Intravenous Stopped 08/09/17 1126)    And  sodium chloride 0.9 % bolus 1,000 mL (0 mLs Intravenous Stopped 08/09/17 1126)  piperacillin-tazobactam (ZOSYN) IVPB 3.375 g (0 g Intravenous Stopped 08/09/17 1126)  vancomycin (VANCOCIN) 1,500 mg in sodium chloride 0.9 % 500 mL IVPB (0 mg  Intravenous Stopped 08/09/17 1302)  furosemide (LASIX) injection 40 mg (40 mg Intravenous Given 08/09/17 1327)     Initial Impression / Assessment and Plan / ED Course  I have reviewed the triage vital signs and the nursing notes.  Pertinent labs & imaging results that were available during my care of the patient were reviewed by me and considered in my medical decision making (see chart for details).     Teresa Roberts is a 56 y.o. female with a past medical history significant for hypertension, hyperlipidemia, [place], PTSD, fibromyalgia, and COPD presents for altered mental status, slurred speech, and rectal bleeding. According to the patient's husband, 4 days ago, patient was having rectal bleeding from previous hemorrhoids. They called the PCP who instructed her to follow up with them in several days. Patient then had multiple falls. Husband says that yesterday, he found her on the ground in the bathroom with slurred speech. He says that she has been sleepy and confused since then. She refused going to the ED yesterday however this morning he was able to convince her to come in. He reports that this is all abnormal for her. He denies patient having any fevers or chills and also denies her having significant nausea, vomiting, or cough. Patient has been incontinent of urine onto herself. He isn't sure of constipation or diarrhea. He says that she has not been eating or drinking.  On exam, patient is disoriented. She is alert to her name. Patient opens her eyes with verbal stimuli. Patient's lungs are coarse bilaterally. Initial oxygen saturation in the 80s however on room air, a good waveform showed been nauseous saturation in the 40s. A nonrebreather was placed and oxygen improved into the 90s. Initial blood pressures in the 33H systolic. Patient will be given fluids. On repeat it was over 545 systolic.  Patient will have labs and imaging ordered to look for etiology of her hypotension, altered mental  status, hypoxia.  Patient found to have UTI and pneumonia. Patient made code sepsis. Antibiotics and fluids were started. Ultrasound ordered given LFT elevation. Troponins rising. BNP very elevated. Fluids slowed.  No evidence of acute cholecystitis on ultrasound.  Patient admitted to hospitalist service for sepsis with multiple sources. Cardiology called and will be involved for heart failure evaluation.  Patient admitted for further management.   Final Clinical Impressions(s) / ED Diagnoses   Final diagnoses:  Elevated liver enzymes     Clinical Impression: 1. Elevated liver enzymes   2. Sepsis ([place])   3. Dyspnea   4. Pain     Disposition: Admit to Hospitalist service    Kailei Cowens, Gwenyth Allegra, MD 08/10/17 [phone]

## 2017-08-09 NOTE — ED Notes (Signed)
Purewick applied.

## 2017-08-10 ENCOUNTER — Inpatient Hospital Stay (HOSPITAL_COMMUNITY): Payer: 59

## 2017-08-10 DIAGNOSIS — J441 Chronic obstructive pulmonary disease with (acute) exacerbation: Secondary | ICD-10-CM

## 2017-08-10 DIAGNOSIS — I5032 Chronic diastolic (congestive) heart failure: Secondary | ICD-10-CM

## 2017-08-10 DIAGNOSIS — Z720 Tobacco use: Secondary | ICD-10-CM

## 2017-08-10 LAB — COMPREHENSIVE METABOLIC PANEL
ALK PHOS: 135 U/L — AB (ref 38–126)
ALT: 793 U/L — AB (ref 14–54)
AST: 904 U/L — AB (ref 15–41)
Albumin: 3 g/dL — ABNORMAL LOW (ref 3.5–5.0)
Anion gap: 10 (ref 5–15)
BUN: 48 mg/dL — AB (ref 6–20)
CHLORIDE: 102 mmol/L (ref 101–111)
CO2: 31 mmol/L (ref 22–32)
Calcium: 9.5 mg/dL (ref 8.9–10.3)
Creatinine, Ser: 1.34 mg/dL — ABNORMAL HIGH (ref 0.44–1.00)
GFR calc non Af Amer: 44 mL/min — ABNORMAL LOW (ref >60)
GFR, EST AFRICAN AMERICAN: 51 mL/min — AB (ref >60)
Glucose, Bld: 88 mg/dL (ref 65–99)
POTASSIUM: 3.1 mmol/L — AB (ref 3.5–5.1)
SODIUM: 143 mmol/L (ref 135–145)
Total Bilirubin: 2.1 mg/dL — ABNORMAL HIGH (ref 0.3–1.2)
Total Protein: 5.9 g/dL — ABNORMAL LOW (ref 6.5–8.1)

## 2017-08-10 LAB — URINE CULTURE

## 2017-08-10 LAB — LACTIC ACID, PLASMA: LACTIC ACID, VENOUS: 0.8 mmol/L (ref 0.5–1.9)

## 2017-08-10 LAB — HEPATITIS PANEL, ACUTE
HCV Ab: <0.1 s/co ratio (ref 0.0–0.9)
Hep A IgM: NEGATIVE
Hep B C IgM: NEGATIVE
Hepatitis B Surface Ag: NEGATIVE

## 2017-08-10 LAB — CBC
HCT: 33.8 % — ABNORMAL LOW (ref 36.0–46.0)
HEMOGLOBIN: 10 g/dL — AB (ref 12.0–15.0)
MCH: 21.1 pg — AB (ref 26.0–34.0)
MCHC: 29.6 g/dL — ABNORMAL LOW (ref 30.0–36.0)
MCV: 71.2 fL — AB (ref 78.0–100.0)
PLATELETS: 116 10*3/uL — AB (ref 150–400)
RBC: 4.75 MIL/uL (ref 3.87–5.11)
RDW: 19.8 % — ABNORMAL HIGH (ref 11.5–15.5)
WBC: 11.8 10*3/uL — AB (ref 4.0–10.5)

## 2017-08-10 LAB — MRSA PCR SCREENING: MRSA BY PCR: NEGATIVE

## 2017-08-10 LAB — HIV ANTIBODY (ROUTINE TESTING W REFLEX): HIV Screen 4th Generation wRfx: NONREACTIVE

## 2017-08-10 LAB — TROPONIN I: TROPONIN I: 1.67 ng/mL — AB (ref <0.03)

## 2017-08-10 LAB — LEGIONELLA PNEUMOPHILA SEROGP 1 UR AG: L. PNEUMOPHILA SEROGP 1 UR AG: NEGATIVE

## 2017-08-10 MED ORDER — TRAMADOL HCL 50 MG PO TABS
50.0000 mg | ORAL_TABLET | Freq: Four times a day (QID) | ORAL | Status: DC | PRN
  Administered 2017-08-10 – 2017-08-11 (×3): 50 mg via ORAL
  Filled 2017-08-10 (×3): qty 1

## 2017-08-10 MED ORDER — METHYLPREDNISOLONE SODIUM SUCC 40 MG IJ SOLR
40.0000 mg | Freq: Three times a day (TID) | INTRAMUSCULAR | Status: DC
  Administered 2017-08-10 – 2017-08-11 (×4): 40 mg via INTRAVENOUS
  Filled 2017-08-10 (×4): qty 1

## 2017-08-10 MED ORDER — ORAL CARE MOUTH RINSE
15.0000 mL | Freq: Two times a day (BID) | OROMUCOSAL | Status: DC
  Administered 2017-08-10 – 2017-08-12 (×3): 15 mL via OROMUCOSAL

## 2017-08-10 MED ORDER — IPRATROPIUM-ALBUTEROL 0.5-2.5 (3) MG/3ML IN SOLN: 3.0000 mL | RESPIRATORY_TRACT | Status: DC | PRN

## 2017-08-10 NOTE — Progress Notes (Signed)
RT placed patient on CPAP HS. 6L O2  Bleed in needed. Patient tolerating well at this time.

## 2017-08-10 NOTE — Progress Notes (Signed)
Patient arrived to unit [place] [place] bed 6 from emergency room.Assisted patient to bed by nursing staff.Patient denies pain or discomfort at present time remains on  nonrebreather mask oxygen saturations 100%.Patient is at risk for falls educated patient and family yellow arm band ,yellow socks, and bed alarm on for safety.Educated patient and family to nurse call bell and nursing unit.No acute distress at present time will continue to monitor.

## 2017-08-10 NOTE — Progress Notes (Signed)
Progress Note  Patient Name: Teresa Roberts Date of Encounter: 08/10/2017  Primary Cardiologist:   Dr. Johnsie Cancel  Subjective   Breathing OK this morning.  No chest pain.   Inpatient Medications    Scheduled Meds: . DULoxetine  30 mg Oral Daily  . fluticasone furoate-vilanterol  1 puff Inhalation Daily   And  . umeclidinium bromide  1 puff Inhalation Daily  . heparin  5,000 Units Subcutaneous Q8H  . mouth rinse  15 mL Mouth Rinse BID  . pantoprazole  40 mg Oral Daily  . simvastatin  10 mg Oral QHS   Continuous Infusions: . sodium chloride 50 mL/hr at 08/09/17 1327  . piperacillin-tazobactam (ZOSYN)  IV 3.375 g (08/10/17 0414)  . vancomycin     PRN Meds: acetaminophen **OR** acetaminophen, methocarbamol, ondansetron **OR** ondansetron (ZOFRAN) IV, traZODone   Vital Signs    Vitals:   08/10/17 0048 08/10/17 0511 08/10/17 0600 08/10/17 0757  BP:  105/70  102/70  Pulse: 72 77  69  Resp: 20 (!) 21    Temp:  98.9 F (37.2 C)  97.7 F (36.5 C)  TempSrc:  Oral  Oral  SpO2: 100% 91% 99% 99%  Weight:  187 lb 9.6 oz (85.1 kg)    Height:        Intake/Output Summary (Last 24 hours) at 08/10/17 0901 Last data filed at 08/10/17 0802  Gross per 24 hour  Intake             2050 ml  Output             2550 ml  Net             -500 ml   Filed Weights   08/09/17 1000 08/09/17 2247 08/10/17 0511  Weight: 192 lb 14.4 oz (87.5 kg) 187 lb 9.6 oz (85.1 kg) 187 lb 9.6 oz (85.1 kg)    Telemetry    NSR - Personally Reviewed  ECG    NA - Personally Reviewed  Physical Exam   GEN: No acute distress.   Neck: No  JVD Cardiac: RRR, no murmurs, rubs, or gallops.  Respiratory: Clear  to auscultation bilaterally. GI: Soft, nontender, non-distended  MS: No  edema; No deformity. Neuro:  Nonfocal  Psych: Normal affect   Labs    Chemistry Recent Labs Lab 08/09/17 0955 08/10/17 0054  NA 139 143  K 4.7 3.1*  CL 103 102  CO2 21* 31  GLUCOSE 104* 88  BUN 81* 48*  CREATININE  2.45* 1.34*  CALCIUM 9.9 9.5  PROT 6.9 5.9*  ALBUMIN 3.5 3.0*  AST 482* 904*  ALT 405* 793*  ALKPHOS 149* 135*  BILITOT 2.7* 2.1*  GFRNONAA 21* 44*  GFRAA 24* 51*  ANIONGAP 15 10     Hematology Recent Labs Lab 08/09/17 0955 08/10/17 0054  WBC 16.3* 11.8*  RBC 5.18* 4.75  HGB 11.3* 10.0*  HCT 37.0 33.8*  MCV 71.4* 71.2*  MCH 21.8* 21.1*  MCHC 30.5 29.6*  RDW 20.2* 19.8*  PLT 163 116*    Cardiac Enzymes Recent Labs Lab 08/09/17 1259 08/09/17 1912 08/10/17 0054  TROPONINI 1.13* 1.62* 1.67*    Recent Labs Lab 08/09/17 1007 08/09/17 1318  TROPIPOC 0.78* 1.11*     BNP Recent Labs Lab 08/09/17 0956  BNP 2,139.4*     DDimer No results for input(s): DDIMER in the last 168 hours.   Radiology    Dg Chest 2 View  Result Date: 08/10/2017 CLINICAL DATA:  Shortness  of breath. History of stepped cysts and CHF. Also COPD current smoker. EXAM: CHEST  2 VIEW COMPARISON:  Portable chest x-ray of August 09, 2017 FINDINGS: The lungs are adequately inflated. There is bilateral hilar prominence which is stable. The pulmonary vascularity appears engorged with some cephalization. The interstitial markings are less prominent today. There remain increased densities lateral to the left heart border. IMPRESSION: Slight interval improvement in the appearance of the chest with decreased interstitial edema. Persistent bilateral hilar prominence may reflect the congested pulmonary vascularity though underlying lymphadenopathy is not excluded. Electronically Signed   By: David  Martinique M.D.   On: 08/10/2017 07:50   Ct Head Wo Contrast  Result Date: 08/09/2017 CLINICAL DATA:  Altered level of consciousness, recent falls, initial encounter EXAM: CT HEAD WITHOUT CONTRAST TECHNIQUE: Contiguous axial images were obtained from the base of the skull through the vertex without intravenous contrast. COMPARISON:  None. FINDINGS: Brain: No evidence of acute infarction, hemorrhage, hydrocephalus,  extra-axial collection or mass lesion/mass effect. Vascular: No hyperdense vessel or unexpected calcification. Skull: Normal. Negative for fracture or focal lesion. Sinuses/Orbits: No acute finding. Other: None. IMPRESSION: No acute intracranial abnormality noted. Electronically Signed   By: Inez Catalina M.D.   On: 08/09/2017 11:01   Dg Chest Portable 1 View  Result Date: 08/09/2017 CLINICAL DATA:  Hypoxia, tachycardia, altered mental status. EXAM: PORTABLE CHEST 1 VIEW COMPARISON:  01/26/2017 FINDINGS: Cardiomegaly with vascular congestion and bilateral lower lobe airspace opacities which could reflect edema or infection. Suspect small right effusion. No acute bony abnormality. IMPRESSION: Cardiomegaly with vascular congestion and bilateral lower lobe opacities, edema versus infection. Small right effusion. Electronically Signed   By: Rolm Baptise M.D.   On: 08/09/2017 10:26   US Abdomen Limited Ruq  Result Date: 08/09/2017 CLINICAL DATA:  Elevated liver enzymes. EXAM: ULTRASOUND ABDOMEN LIMITED RIGHT UPPER QUADRANT COMPARISON:  09/12/2014. FINDINGS: Gallbladder: No gallstones or wall thickening visualized. No sludge noted on today's exam. No sonographic Murphy sign noted by sonographer. Common bile duct: Diameter: 3.6 mm Liver: Increase echogenicity consistent fatty infiltration and/or hepatocellular disease. No focal hepatic abnormality identified. Portal vein is patent on color Doppler imaging with normal direction of blood flow towards the liver. IMPRESSION: 1. Increased hepatic echogenicity consistent fatty infiltration or hepatocellular disease. Similar findings noted on prior exam. 2. No acute abnormality identified. No evidence of gallstones or sludge noted on today's exam. No biliary distention. Electronically Signed   By: Marcello Moores  Register   On: 08/09/2017 12:42    Cardiac Studies   ECHO PENDING:    Patient Profile     56 y.o. female with a hx of chronic diastolic CHF, moderate pulmonary  HTN, fibromyalgia, oxygen dependent COPD, HTN, and HLD who is being seen for the evaluation of elevated troponin, volume overload at the request of Dr. Marily Memos.   Assessment & Plan    ELEVATED TROPONIN:   Elevated but trend is flat.  No acute EKG changes on presentation.  Repeat EKG.  Echo is pending.     ACUTE ON CHRONIC DIASTOLIC HF:  Chest Xray with interval improvement.  Intake and output incomplete.   Improved clinically.   Echo pending.   AKI:    Creat is improved.  ELEVATED LIVER ENZYMES:  AST/ALT increasing.  Plan per primary team.   Stop Zocor.   Signed, Minus Breeding, MD  08/10/2017, 9:01 AM

## 2017-08-10 NOTE — Progress Notes (Signed)
Nurse will call when PT is ready to be placed on CPAP.

## 2017-08-10 NOTE — Progress Notes (Signed)
Called and received report from [facility].

## 2017-08-10 NOTE — Progress Notes (Signed)
Patient ID: Teresa Roberts, female   DOB: Feb 21, 1961, 56 y.o.   MRN: [id number]  PROGRESS NOTE    Nannie Starzyk  OFB:[id number] DOB: 24-Aug-1961 DOA: 08/09/2017 PCP: Jonathon Jordan, MD   Brief Narrative:  56 year old female with history of hyperlipidemia, hypertension, PTSD, fibromyalgia, COPD, chronic hypoxic respiratory failure on home oxygen presented on 08/09/2017 with confusion. She was found to have probable pneumonia versus UTI along with acute kidney injury, abnormal LFTs and positive troponin. Cardiology was consulted. She was started on IV fluids and broad spectrum antibiotics.   Assessment & Plan:   Active Problems:   Fibromyalgia   Migraine   COPD (chronic obstructive pulmonary disease) (HCC)   Tobacco abuse   Sepsis ([place])   Acute on chronic respiratory failure with hypoxia (HCC)   Acute renal failure (ARF) (HCC)   Transaminitis   Acute on chronic diastolic CHF (congestive heart failure) (HCC)   Elevated troponin   Sepsis secondary to UTI ([place])   Acute metabolic encephalopathy   Sepsis: - likely multifactorial including secondary to UTI and possible CAP.  - Continue broad-spectrum antibiotics. Follow cultures - Blood pressure improving but still on the lower side. - Continue IV fluids.   UTI: - Continue antibiotics. Follow cultures   Acute on chronic hypoxic respiratory failure:  - Respiratory status improving. Continue nasal cannula oxygen supplementation.  - Continue broad-spectrum antibiotics; unclear if the patient has bacterial pneumonia. Repeat chest x-ray for tomorrow.  - Follow 2-D echo.   COPD with probable exacerbation - Continue Breo and Incruse ellipta. Start Solu-Medrol 40 mg IV every 8 hours.  - If respiratory status worsens, will get pulmonary evaluation  Leukocytosis - Probably secondary to above. Improving  Tobacco abuse ongoing - Counseled about tobacco cessation  Chronic diastolic heart failure - Blood pressure on the lower side. We will  hold off on Lasix for now. Cardiology following. Monitor strict input and output and daily weights  Elevated Troponin: likely secondary demand and hypoperfusion from sepsis. Recent heart cath (01/2017) by Dr. Claiborne Billings showing non-obstructive CAD.  - Cardiology following. - Echo pending  Acute Renal failure:  -Creatinine improving. Repeat a.m. labs. Continue IV fluids  Acute Transaminitis probably from shock liver:  - LFTs worsening. Repeat a.m. labs.Ultrasound does not show gallstones or cholecystitis or biliary dilatation  HTN:  -Blood pressure on the lower side. Monitor off antihypertensives  Acute metabolic encephalopathy leading to altered mental status:  -likely from sepsis, hypoxemia, and mild uremia.  -Improving . Monitor mental status   Depression: - continue cymbalta  Chronic MSK/Neuropathic pain: - We will monitor for now. Restart Neurontin and Robaxin once mental status stabilizes.  HA: h/o migraines - hold maxalt for now   DVT prophylaxis: Heparin Code Status:  Full Family Communication: Discussed with mother at bedside Disposition Plan: Pending clinical improvement  Consultants: Cardiology   Procedures: None  Antimicrobials: Vancomycin and Zosyn from 08/09/2017    Subjective: Patient seen and examined at bedside. She feels little better. She is more awake. She complains of mild lower abdominal pain and poor appetite but no current chest pain.  Objective: Vitals:   08/10/17 0048 08/10/17 0511 08/10/17 0600 08/10/17 0757  BP:  105/70  102/70  Pulse: 72 77  69  Resp: 20 (!) 21    Temp:  98.9 F (37.2 C)  97.7 F (36.5 C)  TempSrc:  Oral  Oral  SpO2: 100% 91% 99% 99%  Weight:  85.1 kg (187 lb 9.6 oz)    Height:  Intake/Output Summary (Last 24 hours) at 08/10/17 1038 Last data filed at 08/10/17 0802  Gross per 24 hour  Intake             2050 ml  Output             2550 ml  Net             -500 ml   Filed Weights   08/09/17 1000  08/09/17 2247 08/10/17 0511  Weight: 87.5 kg (192 lb 14.4 oz) 85.1 kg (187 lb 9.6 oz) 85.1 kg (187 lb 9.6 oz)    Examination:  General exam: Appears Older than stated age Respiratory system: Bilateral decreased breath sound at bases with scattered crackles Cardiovascular system: S1 & S2 heard, rate controlled.  Gastrointestinal system: Abdomen is nondistended, soft and nontender. Normal bowel sounds heard. Central nervous system: Sleepy but wakes up and answers questions. No focal neurological deficits. Moving extremities Extremities: No cyanosis, clubbing; trace edema  Skin: No rashes, lesions or ulcers Lymph: No cervical lymphadenopathy    Data Reviewed: I have personally reviewed following labs and imaging studies  CBC:  Recent Labs Lab 08/09/17 0955 08/10/17 0054  WBC 16.3* 11.8*  NEUTROABS 13.4*  --   HGB 11.3* 10.0*  HCT 37.0 33.8*  MCV 71.4* 71.2*  PLT 163 924*   Basic Metabolic Panel:  Recent Labs Lab 08/09/17 0955 08/09/17 1259 08/10/17 0054  NA 139  --  143  K 4.7  --  3.1*  CL 103  --  102  CO2 21*  --  31  GLUCOSE 104*  --  88  BUN 81*  --  48*  CREATININE 2.45*  --  1.34*  CALCIUM 9.9  --  9.5  MG  --  2.5*  --   PHOS  --  3.3  --    GFR: Estimated Creatinine Clearance: 52.1 mL/min (A) (by C-G formula based on SCr of 1.34 mg/dL (H)). Liver Function Tests:  Recent Labs Lab 08/09/17 0955 08/10/17 0054  AST 482* 904*  ALT 405* 793*  ALKPHOS 149* 135*  BILITOT 2.7* 2.1*  PROT 6.9 5.9*  ALBUMIN 3.5 3.0*    Recent Labs Lab 08/09/17 0955  LIPASE 57*    Recent Labs Lab 08/09/17 1205 08/09/17 1259  AMMONIA 54* 48*   Coagulation Profile:  Recent Labs Lab 08/09/17 0955  INR 1.73   Cardiac Enzymes:  Recent Labs Lab 08/09/17 0955 08/09/17 1259 08/09/17 1912 08/10/17 0054  CKTOTAL 183  --   --   --   TROPONINI  --  1.13* 1.62* 1.67*   BNP (last 3 results) No results for input(s): PROBNP in the last 8760 hours. HbA1C: No  results for input(s): HGBA1C in the last 72 hours. CBG:  Recent Labs Lab 08/09/17 0946  GLUCAP 106*   Lipid Profile: No results for input(s): CHOL, HDL, LDLCALC, TRIG, CHOLHDL, LDLDIRECT in the last 72 hours. Thyroid Function Tests: No results for input(s): TSH, T4TOTAL, FREET4, T3FREE, THYROIDAB in the last 72 hours. Anemia Panel: No results for input(s): VITAMINB12, FOLATE, FERRITIN, TIBC, IRON, RETICCTPCT in the last 72 hours. Sepsis Labs:  Recent Labs Lab 08/09/17 1259 08/09/17 1320 08/09/17 1641 08/10/17 0054  PROCALCITON 0.52  --   --   --   LATICACIDVEN 2.8* 2.89* 2.2* 0.8    Recent Results (from the past 240 hour(s))  Urine culture     Status: Abnormal   Collection Time: 08/09/17 10:39 AM  Result Value Ref Range Status  Specimen Description URINE, RANDOM  Final   Special Requests NONE  Final   Culture MULTIPLE SPECIES PRESENT, SUGGEST RECOLLECTION (A)  Final   Report Status 08/10/2017 FINAL  Final  MRSA PCR Screening     Status: None   Collection Time: 08/09/17 10:51 PM  Result Value Ref Range Status   MRSA by PCR NEGATIVE NEGATIVE Final    Comment:        The GeneXpert MRSA Assay (FDA approved for NASAL specimens only), is one component of a comprehensive MRSA colonization surveillance program. It is not intended to diagnose MRSA infection nor to guide or monitor treatment for MRSA infections.          Radiology Studies: Dg Chest 2 View  Result Date: 08/10/2017 CLINICAL DATA:  Shortness of breath. History of stepped cysts and CHF. Also COPD current smoker. EXAM: CHEST  2 VIEW COMPARISON:  Portable chest x-ray of August 09, 2017 FINDINGS: The lungs are adequately inflated. There is bilateral hilar prominence which is stable. The pulmonary vascularity appears engorged with some cephalization. The interstitial markings are less prominent today. There remain increased densities lateral to the left heart border. IMPRESSION: Slight interval improvement  in the appearance of the chest with decreased interstitial edema. Persistent bilateral hilar prominence may reflect the congested pulmonary vascularity though underlying lymphadenopathy is not excluded. Electronically Signed   By: David  Martinique M.D.   On: 08/10/2017 07:50   Ct Head Wo Contrast  Result Date: 08/09/2017 CLINICAL DATA:  Altered level of consciousness, recent falls, initial encounter EXAM: CT HEAD WITHOUT CONTRAST TECHNIQUE: Contiguous axial images were obtained from the base of the skull through the vertex without intravenous contrast. COMPARISON:  None. FINDINGS: Brain: No evidence of acute infarction, hemorrhage, hydrocephalus, extra-axial collection or mass lesion/mass effect. Vascular: No hyperdense vessel or unexpected calcification. Skull: Normal. Negative for fracture or focal lesion. Sinuses/Orbits: No acute finding. Other: None. IMPRESSION: No acute intracranial abnormality noted. Electronically Signed   By: Inez Catalina M.D.   On: 08/09/2017 11:01   Dg Chest Portable 1 View  Result Date: 08/09/2017 CLINICAL DATA:  Hypoxia, tachycardia, altered mental status. EXAM: PORTABLE CHEST 1 VIEW COMPARISON:  01/26/2017 FINDINGS: Cardiomegaly with vascular congestion and bilateral lower lobe airspace opacities which could reflect edema or infection. Suspect small right effusion. No acute bony abnormality. IMPRESSION: Cardiomegaly with vascular congestion and bilateral lower lobe opacities, edema versus infection. Small right effusion. Electronically Signed   By: Rolm Baptise M.D.   On: 08/09/2017 10:26   US Abdomen Limited Ruq  Result Date: 08/09/2017 CLINICAL DATA:  Elevated liver enzymes. EXAM: ULTRASOUND ABDOMEN LIMITED RIGHT UPPER QUADRANT COMPARISON:  09/12/2014. FINDINGS: Gallbladder: No gallstones or wall thickening visualized. No sludge noted on today's exam. No sonographic Murphy sign noted by sonographer. Common bile duct: Diameter: 3.6 mm Liver: Increase echogenicity consistent  fatty infiltration and/or hepatocellular disease. No focal hepatic abnormality identified. Portal vein is patent on color Doppler imaging with normal direction of blood flow towards the liver. IMPRESSION: 1. Increased hepatic echogenicity consistent fatty infiltration or hepatocellular disease. Similar findings noted on prior exam. 2. No acute abnormality identified. No evidence of gallstones or sludge noted on today's exam. No biliary distention. Electronically Signed   By: Marcello Moores  Register   On: 08/09/2017 12:42        Scheduled Meds: . DULoxetine  30 mg Oral Daily  . fluticasone furoate-vilanterol  1 puff Inhalation Daily   And  . umeclidinium bromide  1 puff Inhalation Daily  .  heparin  5,000 Units Subcutaneous Q8H  . mouth rinse  15 mL Mouth Rinse BID  . pantoprazole  40 mg Oral Daily   Continuous Infusions: . sodium chloride 50 mL/hr at 08/09/17 1327  . piperacillin-tazobactam (ZOSYN)  IV Stopped (08/10/17 7711)  . vancomycin       LOS: 1 day        Aline August, MD [facility] Pager [phone]  If 7PM-7AM, please contact night-coverage [email or web address] Password [facility] 08/10/2017, 10:38 AM

## 2017-08-11 ENCOUNTER — Inpatient Hospital Stay (HOSPITAL_COMMUNITY): Payer: 59

## 2017-08-11 ENCOUNTER — Telehealth: Payer: Self-pay | Admitting: Cardiology

## 2017-08-11 DIAGNOSIS — S82892A Other fracture of left lower leg, initial encounter for closed fracture: Secondary | ICD-10-CM

## 2017-08-11 LAB — CBC WITH DIFFERENTIAL/PLATELET
BASOS ABS: 0 10*3/uL (ref 0.0–0.1)
Basophils Relative: 0 %
EOS PCT: 0 %
Eosinophils Absolute: 0 10*3/uL (ref 0.0–0.7)
HEMATOCRIT: 35.5 % — AB (ref 36.0–46.0)
Hemoglobin: 10.7 g/dL — ABNORMAL LOW (ref 12.0–15.0)
LYMPHS ABS: 0.3 10*3/uL — AB (ref 0.7–4.0)
LYMPHS PCT: 3 %
MCH: 21.4 pg — AB (ref 26.0–34.0)
MCHC: 30.1 g/dL (ref 30.0–36.0)
MCV: 71.1 fL — AB (ref 78.0–100.0)
MONO ABS: 0.3 10*3/uL (ref 0.1–1.0)
Monocytes Relative: 3 %
NEUTROS ABS: 8 10*3/uL — AB (ref 1.7–7.7)
Neutrophils Relative %: 94 %
PLATELETS: 118 10*3/uL — AB (ref 150–400)
RBC: 4.99 MIL/uL (ref 3.87–5.11)
RDW: 20.8 % — AB (ref 11.5–15.5)
WBC: 8.6 10*3/uL (ref 4.0–10.5)

## 2017-08-11 LAB — COMPREHENSIVE METABOLIC PANEL
ALT: 1486 U/L — AB (ref 14–54)
AST: 922 U/L — AB (ref 15–41)
Albumin: 2.9 g/dL — ABNORMAL LOW (ref 3.5–5.0)
Alkaline Phosphatase: 130 U/L — ABNORMAL HIGH (ref 38–126)
Anion gap: 10 (ref 5–15)
BILIRUBIN TOTAL: 1.5 mg/dL — AB (ref 0.3–1.2)
BUN: 22 mg/dL — AB (ref 6–20)
CHLORIDE: 102 mmol/L (ref 101–111)
CO2: 27 mmol/L (ref 22–32)
CREATININE: 0.79 mg/dL (ref 0.44–1.00)
Calcium: 10 mg/dL (ref 8.9–10.3)
Glucose, Bld: 140 mg/dL — ABNORMAL HIGH (ref 65–99)
Potassium: 3.9 mmol/L (ref 3.5–5.1)
Sodium: 139 mmol/L (ref 135–145)
TOTAL PROTEIN: 6.3 g/dL — AB (ref 6.5–8.1)

## 2017-08-11 LAB — MAGNESIUM: MAGNESIUM: 2 mg/dL (ref 1.7–2.4)

## 2017-08-11 MED ORDER — ARIPIPRAZOLE 5 MG PO TABS
5.0000 mg | ORAL_TABLET | Freq: Every day | ORAL | Status: DC
  Administered 2017-08-11 – 2017-08-13 (×3): 5 mg via ORAL
  Filled 2017-08-11 (×3): qty 1

## 2017-08-11 MED ORDER — LEVOFLOXACIN 500 MG PO TABS
500.0000 mg | ORAL_TABLET | Freq: Every day | ORAL | Status: DC
  Administered 2017-08-11 – 2017-08-13 (×3): 500 mg via ORAL
  Filled 2017-08-11 (×3): qty 1

## 2017-08-11 MED ORDER — METHYLPREDNISOLONE SODIUM SUCC 40 MG IJ SOLR
40.0000 mg | Freq: Two times a day (BID) | INTRAMUSCULAR | Status: DC
  Administered 2017-08-12 (×2): 40 mg via INTRAVENOUS
  Filled 2017-08-11: qty 1

## 2017-08-11 MED ORDER — CEFAZOLIN SODIUM-DEXTROSE 2-4 GM/100ML-% IV SOLN
2.0000 g | INTRAVENOUS | Status: DC
  Filled 2017-08-11: qty 100

## 2017-08-11 MED ORDER — CHLORHEXIDINE GLUCONATE 4 % EX LIQD: 60.0000 mL | Freq: Once | CUTANEOUS | Status: DC

## 2017-08-11 MED ORDER — CEFAZOLIN SODIUM-DEXTROSE 2-4 GM/100ML-% IV SOLN
2.0000 g | INTRAVENOUS | Status: AC
  Administered 2017-08-12: 2 g via INTRAVENOUS
  Filled 2017-08-11 (×2): qty 100

## 2017-08-11 MED ORDER — BUSPIRONE HCL 5 MG PO TABS
15.0000 mg | ORAL_TABLET | Freq: Two times a day (BID) | ORAL | Status: DC
  Administered 2017-08-11 – 2017-08-13 (×5): 15 mg via ORAL
  Filled 2017-08-11 (×5): qty 1

## 2017-08-11 MED ORDER — POVIDONE-IODINE 10 % EX SWAB: 2.0000 "application " | Freq: Once | CUTANEOUS | Status: DC

## 2017-08-11 MED ORDER — GABAPENTIN 400 MG PO CAPS
800.0000 mg | ORAL_CAPSULE | Freq: Two times a day (BID) | ORAL | Status: DC
  Administered 2017-08-11 – 2017-08-13 (×5): 800 mg via ORAL
  Filled 2017-08-11 (×5): qty 2

## 2017-08-11 NOTE — Telephone Encounter (Signed)
Error

## 2017-08-11 NOTE — Consult Note (Signed)
Reason for Consult:Ankle fx Referring Physician: Michelene Keniston is an 56 y.o. female.  HPI: Teresa Roberts was admitted 2d ago 2/2 sepsis likely from PNA. She was confused and somnolent on arrival. As she has improved she began to c/o left ankle pain. X-rays showed an ankle fx. Her son notes she fell multiple times over the weekend.  Past Medical History:  Diagnosis Date  . COPD (chronic obstructive pulmonary disease) ([place])    O2 dependent  . cpap   . Depression    PTSD  . Diastolic CHF ([place])   . Fibromyalgia   . Hyperlipidemia   . Hypertension   . Migraines     Past Surgical History:  Procedure Laterality Date  . APPENDECTOMY    . CESAREAN SECTION    . RIGHT/LEFT HEART CATH AND CORONARY ANGIOGRAPHY N/A 01/27/2017   Procedure: Right/Left Heart Cath and Coronary Angiography;  Surgeon: Troy Sine, MD;  Location: [facility];  Service: Cardiovascular;  Laterality: N/A;    Family History  Problem Relation Age of Onset  . Diabetes Mother   . Hypertension Mother   . CAD Mother   . Diabetes Father   . Lung cancer Father   . Diabetes Sister     Social History:  reports that she has been smoking Cigarettes.  She has a 39.00 pack-year smoking history. She uses smokeless tobacco. She reports that she drinks about 0.6 oz of alcohol per week . She reports that she does not use drugs.  Allergies:  Allergies  Allergen Reactions  . Citrus Hives  . Lisinopril Cough    Medications: I have reviewed the patient's current medications.  Results for orders placed or performed during the hospital encounter of 08/09/17 (from the past 48 hour(s))  Acetaminophen level     Status: Abnormal   Collection Time: 08/09/17  9:10 AM  Result Value Ref Range   Acetaminophen (Tylenol), Serum <10 (L) 10 - 30 ug/mL    Comment:        THERAPEUTIC CONCENTRATIONS VARY SIGNIFICANTLY. A RANGE OF 10-30 ug/mL MAY BE AN EFFECTIVE CONCENTRATION FOR MANY PATIENTS. HOWEVER, SOME ARE BEST TREATED AT  CONCENTRATIONS OUTSIDE THIS RANGE. ACETAMINOPHEN CONCENTRATIONS >150 ug/mL AT 4 HOURS AFTER INGESTION AND >50 ug/mL AT 12 HOURS AFTER INGESTION ARE OFTEN ASSOCIATED WITH TOXIC REACTIONS.   Salicylate level     Status: None   Collection Time: 08/09/17  9:10 AM  Result Value Ref Range   Salicylate Lvl <3.2 2.8 - 30.0 mg/dL  CBG monitoring, ED     Status: Abnormal   Collection Time: 08/09/17  9:46 AM  Result Value Ref Range   Glucose-Capillary 106 (H) 65 - 99 mg/dL  CBC with Differential     Status: Abnormal   Collection Time: 08/09/17  9:55 AM  Result Value Ref Range   WBC 16.3 (H) 4.0 - 10.5 K/uL   RBC 5.18 (H) 3.87 - 5.11 MIL/uL   Hemoglobin 11.3 (L) 12.0 - 15.0 g/dL   HCT 37.0 36.0 - 46.0 %   MCV 71.4 (L) 78.0 - 100.0 fL   MCH 21.8 (L) 26.0 - 34.0 pg   MCHC 30.5 30.0 - 36.0 g/dL   RDW 20.2 (H) 11.5 - 15.5 %   Platelets 163 150 - 400 K/uL   Neutrophils Relative % 82 %   Lymphocytes Relative 8 %   Monocytes Relative 10 %   Eosinophils Relative 0 %   Basophils Relative 0 %   Neutro Abs 13.4 (  H) 1.7 - 7.7 K/uL   Lymphs Abs 1.3 0.7 - 4.0 K/uL   Monocytes Absolute 1.6 (H) 0.1 - 1.0 K/uL   Eosinophils Absolute 0.0 0.0 - 0.7 K/uL   Basophils Absolute 0.0 0.0 - 0.1 K/uL   RBC Morphology POLYCHROMASIA PRESENT     Comment: TARGET CELLS RARE NRBCs   Comprehensive metabolic panel     Status: Abnormal   Collection Time: 08/09/17  9:55 AM  Result Value Ref Range   Sodium 139 135 - 145 mmol/L   Potassium 4.7 3.5 - 5.1 mmol/L   Chloride 103 101 - 111 mmol/L   CO2 21 (L) 22 - 32 mmol/L   Glucose, Bld 104 (H) 65 - 99 mg/dL   BUN 81 (H) 6 - 20 mg/dL   Creatinine, Ser 2.45 (H) 0.44 - 1.00 mg/dL   Calcium 9.9 8.9 - 10.3 mg/dL   Total Protein 6.9 6.5 - 8.1 g/dL   Albumin 3.5 3.5 - 5.0 g/dL   AST 482 (H) 15 - 41 U/L   ALT 405 (H) 14 - 54 U/L   Alkaline Phosphatase 149 (H) 38 - 126 U/L   Total Bilirubin 2.7 (H) 0.3 - 1.2 mg/dL   GFR calc non Af Amer 21 (L) >60 mL/min   GFR calc  Af Amer 24 (L) >60 mL/min    Comment: (NOTE) The eGFR has been calculated using the CKD EPI equation. This calculation has not been validated in all clinical situations. eGFR's persistently <60 mL/min signify possible Chronic Kidney Disease.    Anion gap 15 5 - 15  Lipase, blood     Status: Abnormal   Collection Time: 08/09/17  9:55 AM  Result Value Ref Range   Lipase 57 (H) 11 - 51 U/L  Protime-INR     Status: Abnormal   Collection Time: 08/09/17  9:55 AM  Result Value Ref Range   Prothrombin Time 20.1 (H) 11.4 - 15.2 seconds   INR 1.73   CK     Status: None   Collection Time: 08/09/17  9:55 AM  Result Value Ref Range   Total CK 183 38 - 234 U/L  Brain natriuretic peptide     Status: Abnormal   Collection Time: 08/09/17  9:56 AM  Result Value Ref Range   B Natriuretic Peptide 2,139.4 (H) 0.0 - 100.0 pg/mL  Blood culture (routine x 2)     Status: None (Preliminary result)   Collection Time: 08/09/17 10:00 AM  Result Value Ref Range   Specimen Description BLOOD RIGHT ANTECUBITAL    Special Requests      BOTTLES DRAWN AEROBIC AND ANAEROBIC Blood Culture adequate volume   Culture NO GROWTH 1 DAY    Report Status PENDING   I-stat troponin, ED     Status: Abnormal   Collection Time: 08/09/17 10:07 AM  Result Value Ref Range   Troponin i, poc 0.78 (HH) 0.00 - 0.08 ng/mL   Comment NOTIFIED PHYSICIAN    Comment 3            Comment: Due to the release kinetics of cTnI, a negative result within the first hours of the onset of symptoms does not rule out myocardial infarction with certainty. If myocardial infarction is still suspected, repeat the test at appropriate intervals.   I-Stat venous blood gas, ED     Status: Abnormal   Collection Time: 08/09/17 10:09 AM  Result Value Ref Range   pH, Ven 7.273 7.250 - 7.430   pCO2, Ven 54.9   44.0 - 60.0 mmHg   pO2, Ven 113.0 (H) 32.0 - 45.0 mmHg   Bicarbonate 25.4 20.0 - 28.0 mmol/L   TCO2 27 22 - 32 mmol/L   O2 Saturation 98.0 %    Acid-base deficit 2.0 0.0 - 2.0 mmol/L   Patient temperature HIDE    Sample type VENOUS   I-Stat CG4 Lactic Acid, ED     Status: Abnormal   Collection Time: 08/09/17 10:10 AM  Result Value Ref Range   Lactic Acid, Venous 4.55 (HH) 0.5 - 1.9 mmol/L   Comment NOTIFIED PHYSICIAN   Blood culture (routine x 2)     Status: None (Preliminary result)   Collection Time: 08/09/17 10:15 AM  Result Value Ref Range   Specimen Description BLOOD RIGHT FOREARM    Special Requests      BOTTLES DRAWN AEROBIC AND ANAEROBIC Blood Culture adequate volume   Culture NO GROWTH 1 DAY    Report Status PENDING   .Cooxemetry Panel (carboxy, met, total hgb, O2 sat)     Status: Abnormal   Collection Time: 08/09/17 10:25 AM  Result Value Ref Range   Total hemoglobin 11.5 (L) 12.0 - 16.0 g/dL   O2 Saturation 98.9 %   Carboxyhemoglobin 4.6 (H) 0.5 - 1.5 %   Methemoglobin 1.0 0.0 - 1.5 %  Urinalysis, Routine w reflex microscopic     Status: Abnormal   Collection Time: 08/09/17 10:39 AM  Result Value Ref Range   Color, Urine YELLOW YELLOW   APPearance CLEAR CLEAR   Specific Gravity, Urine 1.016 1.005 - 1.030   pH 5.0 5.0 - 8.0   Glucose, UA NEGATIVE NEGATIVE mg/dL   Hgb urine dipstick MODERATE (A) NEGATIVE   Bilirubin Urine NEGATIVE NEGATIVE   Ketones, ur NEGATIVE NEGATIVE mg/dL   Protein, ur NEGATIVE NEGATIVE mg/dL   Nitrite NEGATIVE NEGATIVE   Leukocytes, UA MODERATE (A) NEGATIVE   RBC / HPF 6-30 0 - 5 RBC/hpf   WBC, UA TOO NUMEROUS TO COUNT 0 - 5 WBC/hpf   Bacteria, UA MANY (A) NONE SEEN   Squamous Epithelial / LPF NONE SEEN NONE SEEN   WBC Clumps PRESENT    Mucus PRESENT    Hyaline Casts, UA PRESENT   Urine culture     Status: Abnormal   Collection Time: 08/09/17 10:39 AM  Result Value Ref Range   Specimen Description URINE, RANDOM    Special Requests NONE    Culture MULTIPLE SPECIES PRESENT, SUGGEST RECOLLECTION (A)    Report Status 08/10/2017 FINAL   Ammonia     Status: Abnormal   Collection  Time: 08/09/17 12:05 PM  Result Value Ref Range   Ammonia 54 (H) 9 - 35 umol/L  Hepatitis panel, acute     Status: None   Collection Time: 08/09/17 12:59 PM  Result Value Ref Range   Hepatitis B Surface Ag Negative Negative   HCV Ab <0.1 0.0 - 0.9 s/co ratio    Comment: (NOTE)                                  Negative:     < 0.8                             Indeterminate: 0.8 - 0.9                                    Positive:     > 0.9 The CDC recommends that a positive HCV antibody result be followed up with a HCV Nucleic Acid Amplification test (550713). Performed At: [facility] [address] [place], [place] [id number] Hancock William F MD Ph:[phone]    Hep A IgM Negative Negative   Hep B C IgM Negative Negative  Ammonia     Status: Abnormal   Collection Time: 08/09/17 12:59 PM  Result Value Ref Range   Ammonia 48 (H) 9 - 35 umol/L  HIV antibody (Routine Testing)     Status: None   Collection Time: 08/09/17 12:59 PM  Result Value Ref Range   HIV Screen 4th Generation wRfx Non Reactive Non Reactive    Comment: (NOTE) Performed At: [facility] [place] [address] [place], [place] [id number] Hancock William F MD Ph:[phone]   Lactic acid, plasma     Status: Abnormal   Collection Time: 08/09/17 12:59 PM  Result Value Ref Range   Lactic Acid, Venous 2.8 (HH) 0.5 - 1.9 mmol/L    Comment: CRITICAL RESULT CALLED TO, READ BACK BY AND VERIFIED WITH: H.BOWMAN,RN 08/09/17 1348 BY BSLADE   Procalcitonin     Status: None   Collection Time: 08/09/17 12:59 PM  Result Value Ref Range   Procalcitonin 0.52 ng/mL    Comment:        Interpretation: PCT > 0.5 ng/mL and <= 2 ng/mL: Systemic infection (sepsis) is possible, but other conditions are known to elevate PCT as well. (NOTE)         ICU PCT Algorithm               Non ICU PCT Algorithm    ----------------------------     ------------------------------         PCT < 0.25 ng/mL                 PCT < 0.1 ng/mL      Stopping of antibiotics            Stopping of antibiotics       strongly encouraged.               strongly encouraged.    ----------------------------     ------------------------------       PCT level decrease by               PCT < 0.25 ng/mL       >= 80% from peak PCT       OR PCT 0.25 - 0.5 ng/mL          Stopping of antibiotics                                             encouraged.     Stopping of antibiotics           encouraged.    ----------------------------     ------------------------------       PCT level decrease by              PCT >= 0.25 ng/mL       < 80% from peak PCT        AND PCT >= 0.5 ng/mL             Continuing antibiotics                                                encouraged.       Continuing antibiotics            encouraged.    ----------------------------     ------------------------------     PCT level increase compared          PCT > 0.5 ng/mL         with peak PCT AND          PCT >= 0.5 ng/mL             Escalation of antibiotics                                          strongly encouraged.      Escalation of antibiotics        strongly encouraged.   Magnesium     Status: Abnormal   Collection Time: 08/09/17 12:59 PM  Result Value Ref Range   Magnesium 2.5 (H) 1.7 - 2.4 mg/dL  Phosphorus     Status: None   Collection Time: 08/09/17 12:59 PM  Result Value Ref Range   Phosphorus 3.3 2.5 - 4.6 mg/dL  Troponin I     Status: Abnormal   Collection Time: 08/09/17 12:59 PM  Result Value Ref Range   Troponin I 1.13 (HH) <0.03 ng/mL    Comment: CRITICAL RESULT CALLED TO, READ BACK BY AND VERIFIED WITH: H.BOWMAN,RN 1402 08/09/17 CLARK,S   Legionella Pneumophila Serogp 1 Ur Ag     Status: None   Collection Time: 08/09/17  1:01 PM  Result Value Ref Range   L. pneumophila Serogp 1 Ur Ag Negative Negative    Comment: (NOTE) Presumptive negative for L. pneumophila serogroup 1 antigen in urine, suggesting no recent or current infection. Legionnaires'  disease cannot be ruled out since other serogroups and species may also cause disease. Performed At: [facility] [address] [place], [place] [id number] Hancock William F MD Ph:[phone]    Source of Sample NONE   Strep pneumoniae urinary antigen     Status: None   Collection Time: 08/09/17  1:02 PM  Result Value Ref Range   Strep Pneumo Urinary Antigen NEGATIVE NEGATIVE    Comment:        Infection due to S. pneumoniae cannot be absolutely ruled out since the antigen present may be below the detection limit of the test.   I-stat troponin, ED     Status: Abnormal   Collection Time: 08/09/17  1:18 PM  Result Value Ref Range   Troponin i, poc 1.11 (HH) 0.00 - 0.08 ng/mL   Comment NOTIFIED PHYSICIAN    Comment 3            Comment: Due to the release kinetics of cTnI, a negative result within the first hours of the onset of symptoms does not rule out myocardial infarction with certainty. If myocardial infarction is still suspected, repeat the test at appropriate intervals.   I-Stat CG4 Lactic Acid, ED     Status: Abnormal   Collection Time: 08/09/17  1:20 PM  Result Value Ref Range   Lactic Acid, Venous 2.89 (HH) 0.5 - 1.9 mmol/L   Comment NOTIFIED PHYSICIAN   Lactic acid, plasma     Status: Abnormal   Collection Time: 08/09/17  4:41 PM  Result Value Ref Range   Lactic Acid, Venous 2.2 (HH) 0.5 - 1.9 mmol/L    Comment: CRITICAL RESULT CALLED   TO, READ BACK BY AND VERIFIED WITH: H BOWMAN,RN 1735 08/09/2017 WBOND   Troponin I     Status: Abnormal   Collection Time: 08/09/17  7:12 PM  Result Value Ref Range   Troponin I 1.62 (HH) <0.03 ng/mL    Comment: CRITICAL VALUE NOTED.  VALUE IS CONSISTENT WITH PREVIOUSLY REPORTED AND CALLED VALUE.  MRSA PCR Screening     Status: None   Collection Time: 08/09/17 10:51 PM  Result Value Ref Range   MRSA by PCR NEGATIVE NEGATIVE    Comment:        The GeneXpert MRSA Assay (FDA approved for NASAL specimens only), is one  component of a comprehensive MRSA colonization surveillance program. It is not intended to diagnose MRSA infection nor to guide or monitor treatment for MRSA infections.   Troponin I     Status: Abnormal   Collection Time: 08/10/17 12:54 AM  Result Value Ref Range   Troponin I 1.67 (HH) <0.03 ng/mL    Comment: CRITICAL VALUE NOTED.  VALUE IS CONSISTENT WITH PREVIOUSLY REPORTED AND CALLED VALUE.  CBC     Status: Abnormal   Collection Time: 08/10/17 12:54 AM  Result Value Ref Range   WBC 11.8 (H) 4.0 - 10.5 K/uL   RBC 4.75 3.87 - 5.11 MIL/uL   Hemoglobin 10.0 (L) 12.0 - 15.0 g/dL   HCT 33.8 (L) 36.0 - 46.0 %   MCV 71.2 (L) 78.0 - 100.0 fL   MCH 21.1 (L) 26.0 - 34.0 pg   MCHC 29.6 (L) 30.0 - 36.0 g/dL   RDW 19.8 (H) 11.5 - 15.5 %   Platelets 116 (L) 150 - 400 K/uL    Comment: PLATELET COUNT CONFIRMED BY SMEAR  Comprehensive metabolic panel     Status: Abnormal   Collection Time: 08/10/17 12:54 AM  Result Value Ref Range   Sodium 143 135 - 145 mmol/L   Potassium 3.1 (L) 3.5 - 5.1 mmol/L    Comment: DELTA CHECK NOTED   Chloride 102 101 - 111 mmol/L   CO2 31 22 - 32 mmol/L   Glucose, Bld 88 65 - 99 mg/dL   BUN 48 (H) 6 - 20 mg/dL   Creatinine, Ser 1.34 (H) 0.44 - 1.00 mg/dL    Comment: DELTA CHECK NOTED   Calcium 9.5 8.9 - 10.3 mg/dL   Total Protein 5.9 (L) 6.5 - 8.1 g/dL   Albumin 3.0 (L) 3.5 - 5.0 g/dL   AST 904 (H) 15 - 41 U/L   ALT 793 (H) 14 - 54 U/L   Alkaline Phosphatase 135 (H) 38 - 126 U/L   Total Bilirubin 2.1 (H) 0.3 - 1.2 mg/dL   GFR calc non Af Amer 44 (L) >60 mL/min   GFR calc Af Amer 51 (L) >60 mL/min    Comment: (NOTE) The eGFR has been calculated using the CKD EPI equation. This calculation has not been validated in all clinical situations. eGFR's persistently <60 mL/min signify possible Chronic Kidney Disease.    Anion gap 10 5 - 15  Lactic acid, plasma     Status: None   Collection Time: 08/10/17 12:54 AM  Result Value Ref Range   Lactic Acid,  Venous 0.8 0.5 - 1.9 mmol/L  CBC with Differential/Platelet     Status: Abnormal   Collection Time: 08/11/17  5:59 AM  Result Value Ref Range   WBC 8.6 4.0 - 10.5 K/uL   RBC 4.99 3.87 - 5.11 MIL/uL   Hemoglobin 10.7 (L) 12.0 - 15.0   g/dL   HCT 35.5 (L) 36.0 - 46.0 %   MCV 71.1 (L) 78.0 - 100.0 fL   MCH 21.4 (L) 26.0 - 34.0 pg   MCHC 30.1 30.0 - 36.0 g/dL   RDW 20.8 (H) 11.5 - 15.5 %   Platelets 118 (L) 150 - 400 K/uL    Comment: REPEATED TO VERIFY CONSISTENT WITH PREVIOUS RESULT    Neutrophils Relative % 94 %   Neutro Abs 8.0 (H) 1.7 - 7.7 K/uL   Lymphocytes Relative 3 %   Lymphs Abs 0.3 (L) 0.7 - 4.0 K/uL   Monocytes Relative 3 %   Monocytes Absolute 0.3 0.1 - 1.0 K/uL   Eosinophils Relative 0 %   Eosinophils Absolute 0.0 0.0 - 0.7 K/uL   Basophils Relative 0 %   Basophils Absolute 0.0 0.0 - 0.1 K/uL  Comprehensive metabolic panel     Status: Abnormal   Collection Time: 08/11/17  5:59 AM  Result Value Ref Range   Sodium 139 135 - 145 mmol/L   Potassium 3.9 3.5 - 5.1 mmol/L   Chloride 102 101 - 111 mmol/L   CO2 27 22 - 32 mmol/L   Glucose, Bld 140 (H) 65 - 99 mg/dL   BUN 22 (H) 6 - 20 mg/dL   Creatinine, Ser 0.79 0.44 - 1.00 mg/dL   Calcium 10.0 8.9 - 10.3 mg/dL   Total Protein 6.3 (L) 6.5 - 8.1 g/dL   Albumin 2.9 (L) 3.5 - 5.0 g/dL   AST 922 (H) 15 - 41 U/L   ALT 1,486 (H) 14 - 54 U/L   Alkaline Phosphatase 130 (H) 38 - 126 U/L   Total Bilirubin 1.5 (H) 0.3 - 1.2 mg/dL   GFR calc non Af Amer >60 >60 mL/min   GFR calc Af Amer >60 >60 mL/min    Comment: (NOTE) The eGFR has been calculated using the CKD EPI equation. This calculation has not been validated in all clinical situations. eGFR's persistently <60 mL/min signify possible Chronic Kidney Disease.    Anion gap 10 5 - 15  Magnesium     Status: None   Collection Time: 08/11/17  5:59 AM  Result Value Ref Range   Magnesium 2.0 1.7 - 2.4 mg/dL    Dg Chest 2 View  Result Date: 08/10/2017 CLINICAL DATA:   Shortness of breath. History of stepped cysts and CHF. Also COPD current smoker. EXAM: CHEST  2 VIEW COMPARISON:  Portable chest x-ray of August 09, 2017 FINDINGS: The lungs are adequately inflated. There is bilateral hilar prominence which is stable. The pulmonary vascularity appears engorged with some cephalization. The interstitial markings are less prominent today. There remain increased densities lateral to the left heart border. IMPRESSION: Slight interval improvement in the appearance of the chest with decreased interstitial edema. Persistent bilateral hilar prominence may reflect the congested pulmonary vascularity though underlying lymphadenopathy is not excluded. Electronically Signed   By: David  Jordan M.D.   On: 08/10/2017 07:50   Dg Ankle 2 Views Left  Result Date: 08/10/2017 CLINICAL DATA:  56-year-old who fell 5 days ago. Persistent left ankle pain. Initial encounter. EXAM: LEFT ANKLE - 2 VIEW COMPARISON:  None. FINDINGS: Lateral soft tissue swelling. Comminuted mildly displaced oblique fracture involving the distal fibula. Tiny avulsion fracture arising from the medial malleolus. Slight lateral subluxation of the talus relative to the tibial plafond. IMPRESSION: 1. Comminuted mildly displaced oblique fracture involving the distal fibula. 2. Tiny avulsion fracture arising from the medial malleolus. 3. Slight lateral subluxation   of the talus relative to the tibia. Electronically Signed   By: Evangeline Dakin M.D.   On: 08/10/2017 18:46   Ct Head Wo Contrast  Result Date: 08/09/2017 CLINICAL DATA:  Altered level of consciousness, recent falls, initial encounter EXAM: CT HEAD WITHOUT CONTRAST TECHNIQUE: Contiguous axial images were obtained from the base of the skull through the vertex without intravenous contrast. COMPARISON:  None. FINDINGS: Brain: No evidence of acute infarction, hemorrhage, hydrocephalus, extra-axial collection or mass lesion/mass effect. Vascular: No hyperdense vessel or  unexpected calcification. Skull: Normal. Negative for fracture or focal lesion. Sinuses/Orbits: No acute finding. Other: None. IMPRESSION: No acute intracranial abnormality noted. Electronically Signed   By: Inez Catalina M.D.   On: 08/09/2017 11:01   Dg Chest Portable 1 View  Result Date: 08/09/2017 CLINICAL DATA:  Hypoxia, tachycardia, altered mental status. EXAM: PORTABLE CHEST 1 VIEW COMPARISON:  01/26/2017 FINDINGS: Cardiomegaly with vascular congestion and bilateral lower lobe airspace opacities which could reflect edema or infection. Suspect small right effusion. No acute bony abnormality. IMPRESSION: Cardiomegaly with vascular congestion and bilateral lower lobe opacities, edema versus infection. Small right effusion. Electronically Signed   By: Rolm Baptise M.D.   On: 08/09/2017 10:26   US Abdomen Limited Ruq  Result Date: 08/09/2017 CLINICAL DATA:  Elevated liver enzymes. EXAM: ULTRASOUND ABDOMEN LIMITED RIGHT UPPER QUADRANT COMPARISON:  09/12/2014. FINDINGS: Gallbladder: No gallstones or wall thickening visualized. No sludge noted on today's exam. No sonographic Murphy sign noted by sonographer. Common bile duct: Diameter: 3.6 mm Liver: Increase echogenicity consistent fatty infiltration and/or hepatocellular disease. No focal hepatic abnormality identified. Portal vein is patent on color Doppler imaging with normal direction of blood flow towards the liver. IMPRESSION: 1. Increased hepatic echogenicity consistent fatty infiltration or hepatocellular disease. Similar findings noted on prior exam. 2. No acute abnormality identified. No evidence of gallstones or sludge noted on today's exam. No biliary distention. Electronically Signed   By: Marcello Moores  Register   On: 08/09/2017 12:42    Review of Systems  Constitutional: Negative for weight loss.  HENT: Negative for ear discharge, ear pain, hearing loss and tinnitus.   Eyes: Negative for blurred vision, double vision, photophobia and pain.   Respiratory: Negative for cough, sputum production and shortness of breath.   Cardiovascular: Negative for chest pain.  Gastrointestinal: Negative for abdominal pain, nausea and vomiting.  Genitourinary: Negative for dysuria, flank pain, frequency and urgency.  Musculoskeletal: Positive for joint pain (Left ankle). Negative for back pain, falls, myalgias and neck pain.  Neurological: Negative for dizziness, tingling, sensory change, focal weakness, loss of consciousness and headaches.  Endo/Heme/Allergies: Does not bruise/bleed easily.  Psychiatric/Behavioral: Negative for depression, memory loss and substance abuse. The patient is not nervous/anxious.    Blood pressure 99/64, pulse (!) 54, temperature 98.1 F (36.7 C), temperature source Oral, resp. rate 20, height 5' 6" (1.676 m), weight 86 kg (189 lb 11.2 oz), last menstrual period 08/19/2012, SpO2 93 %. Physical Exam  Constitutional: She appears well-developed and well-nourished. No distress.  HENT:  Head: Normocephalic.  Eyes: Conjunctivae are normal. Right eye exhibits no discharge. Left eye exhibits no discharge. No scleral icterus.  Cardiovascular: Normal rate and regular rhythm.   Respiratory: Effort normal. No respiratory distress.  Musculoskeletal:  LLE No traumatic wounds, ecchymosis, or rash  Mild TTP fibular head, moderate TTP ankle  No knee effusion, ankle mildly edematous  Knee stable to varus/ valgus and anterior/posterior stress  Sens DPN, SPN, TN intact  Motor EHL, ext, flex, evers  5/5  DP 2+, PT 2+   Neurological: She is alert.  Skin: Skin is warm and dry. She is not diaphoretic.  Psychiatric: She has a normal mood and affect. Her behavior is normal.    Assessment/Plan: Left ankle fx -- Will need ORIF tomorrow morning by Dr. Lyla Glassing. NPO after MN.    Lisette Abu, PA-C Orthopedic Surgery [phone] 08/11/2017, 8:56 AM

## 2017-08-11 NOTE — Progress Notes (Signed)
Patient ID: Teresa Roberts, female   DOB: 10/03/1961, 56 y.o.   MRN: [id number]  PROGRESS NOTE    Teresa Roberts  TGG:[id number] DOB: [birth year] DOA: 08/09/2017 PCP: Jonathon Jordan, MD   Brief Narrative:  56 year old female with history of hyperlipidemia, hypertension, PTSD, fibromyalgia, COPD, chronic hypoxic respiratory failure on home oxygen presented on 08/09/2017 with confusion. She was found to have probable pneumonia versus UTI along with acute kidney injury, abnormal LFTs and positive troponin. Cardiology was consulted. She was started on IV fluids and broad spectrum antibiotics.   Assessment & Plan:   Active Problems:   Fibromyalgia   Migraine   COPD (chronic obstructive pulmonary disease) (HCC)   Tobacco abuse   Sepsis ([place])   Acute on chronic respiratory failure with hypoxia (HCC)   Acute renal failure (ARF) (HCC)   Transaminitis   Acute on chronic diastolic CHF (congestive heart failure) (HCC)   Elevated troponin   Sepsis secondary to UTI ([place])   Acute metabolic encephalopathy   Sepsis: - likely multifactorial including secondary to UTI and possible CAP.  - Blood cultures negative so far. Urine culture probably contaminant. Repeat urine cultures. - Blood pressure improving but still on the lower side. - Discontinue IV fluids - Discontinue vancomycin. Discontinue Zosyn and start Levaquin  Left ankle fracture - Probably secondary to falls at home - Orthopedics consulted.  UTI: - Continue Zosyn. Follow repeat urine culture  Acute on chronic hypoxic respiratory failure:  - Respiratory status improving. Continue nasal cannula oxygen supplementation.  - Discontinue Zosyn; unclear if the patient has bacterial pneumonia. Start Levaquin - Follow 2-D echo.   COPD with probable exacerbation - Continue Breo and Incruse ellipta. Change Solu-Medrol to 40 mg IV every 12 hours.  - Respiratory status stable  Leukocytosis - Probably secondary to above. Resolved  Tobacco abuse  ongoing - Counseled about tobacco cessation  Chronic diastolic heart failure - Blood pressure on the lower side. We will hold off on Lasix for now. Cardiology following. Monitor strict input and output and daily weights - Echo pending  Elevated Troponin: likely secondary demand and hypoperfusion from sepsis. Recent heart cath (01/2017) by Dr. Claiborne Billings showing non-obstructive CAD.  - Cardiology following.  Acute Renal failure:  -Creatinine improved. Discontinue IV fluids  Acute Transaminitis probably from shock liver:  - LFTs worsening. Repeat a.m. labs.Ultrasound does not show gallstones or cholecystitis or biliary dilatation  HTN:  -Blood pressure on the lower side. Monitor off antihypertensives  Acute metabolic encephalopathy leading to altered mental status:  -likely from sepsis, hypoxemia, and mild uremia.  -Improving . Monitor mental status   Depression: - Hold Cymbalta because of worsening renal function. Monitor  Chronic MSK/Neuropathic pain: - Stable. Continue Neurontin and Robaxin   HA: h/o migraines - hold maxalt for now   DVT prophylaxis: Heparin Code Status:  Full Family Communication: Discussed with husband at bedside Disposition Plan: Pending clinical improvement  Consultants: Cardiology ; orthopedics  Procedures: None  Antimicrobials: Vancomycin and Zosyn from 08/09/2017    Subjective: Patient seen and examined at bedside. She feels little better. She is more awake. She complains of left lower extremity pain but no current chest pain, nausea or vomiting   Objective: Vitals:   08/10/17 2356 08/11/17 0428 08/11/17 0748 08/11/17 1148  BP: 97/70 98/66 99/64  107/61  Pulse: 63 (!) 54    Resp: 20 20    Temp: 98.2 F (36.8 C) 98 F (36.7 C) 98.1 F (36.7 C) 98 F (36.7 C)  TempSrc: Oral  Oral Oral Oral  SpO2: 95% 99% 93% 100%  Weight:  86 kg (189 lb 11.2 oz)    Height:        Intake/Output Summary (Last 24 hours) at 08/11/17 1323 Last data  filed at 08/11/17 0429  Gross per 24 hour  Intake           1877.5 ml  Output              600 ml  Net           1277.5 ml   Filed Weights   08/09/17 2247 08/10/17 0511 08/11/17 0428  Weight: 85.1 kg (187 lb 9.6 oz) 85.1 kg (187 lb 9.6 oz) 86 kg (189 lb 11.2 oz)    Examination:  General exam: Appears Older than stated age Respiratory system: Bilateral decreased breath sound at bases with scattered crackles Cardiovascular system: S1 & S2 heard, rate controlled.  Gastrointestinal system: Abdomen is nondistended, soft and nontender. Normal bowel sounds heard. Central nervous system: More awake today and alert. No focal neurological deficits. Moving extremities Extremities: No cyanosis, clubbing; trace edema with left foot and ankle tenderness and swelling Skin: No rashes, lesions or ulcers Lymph: No cervical lymphadenopathy    Data Reviewed: I have personally reviewed following labs and imaging studies  CBC:  Recent Labs Lab 08/09/17 0955 08/10/17 0054 08/11/17 0559  WBC 16.3* 11.8* 8.6  NEUTROABS 13.4*  --  8.0*  HGB 11.3* 10.0* 10.7*  HCT 37.0 33.8* 35.5*  MCV 71.4* 71.2* 71.1*  PLT 163 116* 875*   Basic Metabolic Panel:  Recent Labs Lab 08/09/17 0955 08/09/17 1259 08/10/17 0054 08/11/17 0559  NA 139  --  143 139  K 4.7  --  3.1* 3.9  CL 103  --  102 102  CO2 21*  --  31 27  GLUCOSE 104*  --  88 140*  BUN 81*  --  48* 22*  CREATININE 2.45*  --  1.34* 0.79  CALCIUM 9.9  --  9.5 10.0  MG  --  2.5*  --  2.0  PHOS  --  3.3  --   --    GFR: Estimated Creatinine Clearance: 87.8 mL/min (by C-G formula based on SCr of 0.79 mg/dL). Liver Function Tests:  Recent Labs Lab 08/09/17 0955 08/10/17 0054 08/11/17 0559  AST 482* 904* 922*  ALT 405* 793* 1,486*  ALKPHOS 149* 135* 130*  BILITOT 2.7* 2.1* 1.5*  PROT 6.9 5.9* 6.3*  ALBUMIN 3.5 3.0* 2.9*    Recent Labs Lab 08/09/17 0955  LIPASE 57*    Recent Labs Lab 08/09/17 1205 08/09/17 1259  AMMONIA  54* 48*   Coagulation Profile:  Recent Labs Lab 08/09/17 0955  INR 1.73   Cardiac Enzymes:  Recent Labs Lab 08/09/17 0955 08/09/17 1259 08/09/17 1912 08/10/17 0054  CKTOTAL 183  --   --   --   TROPONINI  --  1.13* 1.62* 1.67*   BNP (last 3 results) No results for input(s): PROBNP in the last 8760 hours. HbA1C: No results for input(s): HGBA1C in the last 72 hours. CBG:  Recent Labs Lab 08/09/17 0946  GLUCAP 106*   Lipid Profile: No results for input(s): CHOL, HDL, LDLCALC, TRIG, CHOLHDL, LDLDIRECT in the last 72 hours. Thyroid Function Tests: No results for input(s): TSH, T4TOTAL, FREET4, T3FREE, THYROIDAB in the last 72 hours. Anemia Panel: No results for input(s): VITAMINB12, FOLATE, FERRITIN, TIBC, IRON, RETICCTPCT in the last 72 hours. Sepsis Labs:  Recent Labs Lab  08/09/17 1259 08/09/17 1320 08/09/17 1641 08/10/17 0054  PROCALCITON 0.52  --   --   --   LATICACIDVEN 2.8* 2.89* 2.2* 0.8    Recent Results (from the past 240 hour(s))  Blood culture (routine x 2)     Status: None (Preliminary result)   Collection Time: 08/09/17 10:00 AM  Result Value Ref Range Status   Specimen Description BLOOD RIGHT ANTECUBITAL  Final   Special Requests   Final    BOTTLES DRAWN AEROBIC AND ANAEROBIC Blood Culture adequate volume   Culture NO GROWTH 1 DAY  Final   Report Status PENDING  Incomplete  Blood culture (routine x 2)     Status: None (Preliminary result)   Collection Time: 08/09/17 10:15 AM  Result Value Ref Range Status   Specimen Description BLOOD RIGHT FOREARM  Final   Special Requests   Final    BOTTLES DRAWN AEROBIC AND ANAEROBIC Blood Culture adequate volume   Culture NO GROWTH 1 DAY  Final   Report Status PENDING  Incomplete  Urine culture     Status: Abnormal   Collection Time: 08/09/17 10:39 AM  Result Value Ref Range Status   Specimen Description URINE, RANDOM  Final   Special Requests NONE  Final   Culture MULTIPLE SPECIES PRESENT, SUGGEST  RECOLLECTION (A)  Final   Report Status 08/10/2017 FINAL  Final  MRSA PCR Screening     Status: None   Collection Time: 08/09/17 10:51 PM  Result Value Ref Range Status   MRSA by PCR NEGATIVE NEGATIVE Final    Comment:        The GeneXpert MRSA Assay (FDA approved for NASAL specimens only), is one component of a comprehensive MRSA colonization surveillance program. It is not intended to diagnose MRSA infection nor to guide or monitor treatment for MRSA infections.          Radiology Studies: Dg Chest 2 View  Result Date: 08/11/2017 CLINICAL DATA:  Shortness of breath today EXAM: CHEST  2 VIEW COMPARISON:  08/10/2017 FINDINGS: Cardiomegaly hilar fullness is again noted bilaterally. When compared to prior chest CT, no adenopathy was present and therefore this is felt to be vascular. No overt edema. Linear areas of scarring or atelectasis in the lingula. No effusions. No acute bony abnormality. IMPRESSION: Cardiomegaly.  Left lower lung atelectasis or scarring. Stable bilateral hilar prominence, likely vascular. Electronically Signed   By: Rolm Baptise M.D.   On: 08/11/2017 09:05   Dg Chest 2 View  Result Date: 08/10/2017 CLINICAL DATA:  Shortness of breath. History of stepped cysts and CHF. Also COPD current smoker. EXAM: CHEST  2 VIEW COMPARISON:  Portable chest x-ray of August 09, 2017 FINDINGS: The lungs are adequately inflated. There is bilateral hilar prominence which is stable. The pulmonary vascularity appears engorged with some cephalization. The interstitial markings are less prominent today. There remain increased densities lateral to the left heart border. IMPRESSION: Slight interval improvement in the appearance of the chest with decreased interstitial edema. Persistent bilateral hilar prominence may reflect the congested pulmonary vascularity though underlying lymphadenopathy is not excluded. Electronically Signed   By: David  Martinique M.D.   On: 08/10/2017 07:50   Dg  Ankle 2 Views Left  Result Date: 08/10/2017 CLINICAL DATA:  56 year old who fell 5 days ago. Persistent left ankle pain. Initial encounter. EXAM: LEFT ANKLE - 2 VIEW COMPARISON:  None. FINDINGS: Lateral soft tissue swelling. Comminuted mildly displaced oblique fracture involving the distal fibula. Tiny avulsion fracture arising from the  medial malleolus. Slight lateral subluxation of the talus relative to the tibial plafond. IMPRESSION: 1. Comminuted mildly displaced oblique fracture involving the distal fibula. 2. Tiny avulsion fracture arising from the medial malleolus. 3. Slight lateral subluxation of the talus relative to the tibia. Electronically Signed   By: Evangeline Dakin M.D.   On: 08/10/2017 18:46        Scheduled Meds: . ARIPiprazole  5 mg Oral Daily  . chlorhexidine  60 mL Topical Once  . fluticasone furoate-vilanterol  1 puff Inhalation Daily   And  . umeclidinium bromide  1 puff Inhalation Daily  . gabapentin  800 mg Oral BID  . heparin  5,000 Units Subcutaneous Q8H  . mouth rinse  15 mL Mouth Rinse BID  . methylPREDNISolone (SOLU-MEDROL) injection  40 mg Intravenous Q8H  . pantoprazole  40 mg Oral Daily  . povidone-iodine  2 application Topical Once   Continuous Infusions: . sodium chloride 50 mL/hr at 08/10/17 2201  . [START ON 08/12/2017]  ceFAZolin (ANCEF) IV    . piperacillin-tazobactam (ZOSYN)  IV Stopped (08/11/17 1325)  . vancomycin Stopped (08/11/17 1306)     LOS: 2 days        Aline August, MD [facility] Pager [phone]  If 7PM-7AM, please contact night-coverage [email or web address] Password [email or web address] 08/11/2017, 1:23 PM

## 2017-08-11 NOTE — Progress Notes (Signed)
Patient refuses CPAP. Patient states she would rather use oxygen. CPAP taken out of room. Will continue to monitor.

## 2017-08-11 NOTE — Progress Notes (Signed)
Progress Note  Patient Name: Teresa Roberts Date of Encounter: 08/11/2017  Primary Cardiologist:   Dr. Johnsie Cancel  Subjective   Denies chest pain or SOB.    Inpatient Medications    Scheduled Meds: . DULoxetine  30 mg Oral Daily  . fluticasone furoate-vilanterol  1 puff Inhalation Daily   And  . umeclidinium bromide  1 puff Inhalation Daily  . heparin  5,000 Units Subcutaneous Q8H  . mouth rinse  15 mL Mouth Rinse BID  . methylPREDNISolone (SOLU-MEDROL) injection  40 mg Intravenous Q8H  . pantoprazole  40 mg Oral Daily   Continuous Infusions: . sodium chloride 50 mL/hr at 08/10/17 2201  . piperacillin-tazobactam (ZOSYN)  IV Stopped (08/11/17 0829)  . vancomycin Stopped (08/10/17 1300)   PRN Meds: acetaminophen **OR** acetaminophen, ipratropium-albuterol, methocarbamol, ondansetron **OR** ondansetron (ZOFRAN) IV, traMADol, traZODone   Vital Signs    Vitals:   08/10/17 2025 08/10/17 2356 08/11/17 0428 08/11/17 0748  BP: 107/73 97/70 98/66  99/64  Pulse: 70 63 (!) 54   Resp: 20 20 20    Temp: 98.3 F (36.8 C) 98.2 F (36.8 C) 98 F (36.7 C) 98.1 F (36.7 C)  TempSrc: Oral Oral Oral Oral  SpO2: 96% 95% 99% 93%  Weight:   189 lb 11.2 oz (86 kg)   Height:        Intake/Output Summary (Last 24 hours) at 08/11/17 0821 Last data filed at 08/11/17 0429  Gross per 24 hour  Intake           1877.5 ml  Output              900 ml  Net            977.5 ml   Filed Weights   08/09/17 2247 08/10/17 0511 08/11/17 0428  Weight: 187 lb 9.6 oz (85.1 kg) 187 lb 9.6 oz (85.1 kg) 189 lb 11.2 oz (86 kg)    Telemetry    NA - Personally Reviewed  ECG    NA - Personally Reviewed  Physical Exam   GEN: No  acute distress.   Neck: No  JVD Cardiac: RRR, no murmurs, rubs, or gallops.  Respiratory: Clear   to auscultation bilaterally. GI: Soft, nontender, non-distended, normal bowel sounds  MS:  No edema; No deformity. Neuro:   Nonfocal  Psych: Oriented and appropriate    Labs      Chemistry  Recent Labs Lab 08/09/17 0955 08/10/17 0054 08/11/17 0559  NA 139 143 139  K 4.7 3.1* 3.9  CL 103 102 102  CO2 21* 31 27  GLUCOSE 104* 88 140*  BUN 81* 48* 22*  CREATININE 2.45* 1.34* 0.79  CALCIUM 9.9 9.5 10.0  PROT 6.9 5.9* 6.3*  ALBUMIN 3.5 3.0* 2.9*  AST 482* 904* 922*  ALT 405* 793* 1,486*  ALKPHOS 149* 135* 130*  BILITOT 2.7* 2.1* 1.5*  GFRNONAA 21* 44* >60  GFRAA 24* 51* >60  ANIONGAP 15 10 10      Hematology  Recent Labs Lab 08/09/17 0955 08/10/17 0054 08/11/17 0559  WBC 16.3* 11.8* 8.6  RBC 5.18* 4.75 4.99  HGB 11.3* 10.0* 10.7*  HCT 37.0 33.8* 35.5*  MCV 71.4* 71.2* 71.1*  MCH 21.8* 21.1* 21.4*  MCHC 30.5 29.6* 30.1  RDW 20.2* 19.8* 20.8*  PLT 163 116* 118*    Cardiac Enzymes  Recent Labs Lab 08/09/17 1259 08/09/17 1912 08/10/17 0054  TROPONINI 1.13* 1.62* 1.67*     Recent Labs Lab 08/09/17 1007 08/09/17 1318  TROPIPOC 0.78* 1.11*     BNP  Recent Labs Lab 08/09/17 0956  BNP 2,139.4*     DDimer No results for input(s): DDIMER in the last 168 hours.   Radiology    Dg Chest 2 View  Result Date: 08/10/2017 CLINICAL DATA:  Shortness of breath. History of stepped cysts and CHF. Also COPD current smoker. EXAM: CHEST  2 VIEW COMPARISON:  Portable chest x-ray of August 09, 2017 FINDINGS: The lungs are adequately inflated. There is bilateral hilar prominence which is stable. The pulmonary vascularity appears engorged with some cephalization. The interstitial markings are less prominent today. There remain increased densities lateral to the left heart border. IMPRESSION: Slight interval improvement in the appearance of the chest with decreased interstitial edema. Persistent bilateral hilar prominence may reflect the congested pulmonary vascularity though underlying lymphadenopathy is not excluded. Electronically Signed   By: David  Martinique M.D.   On: 08/10/2017 07:50   Dg Ankle 2 Views Left  Result Date:  08/10/2017 CLINICAL DATA:  56 year old who fell 5 days ago. Persistent left ankle pain. Initial encounter. EXAM: LEFT ANKLE - 2 VIEW COMPARISON:  None. FINDINGS: Lateral soft tissue swelling. Comminuted mildly displaced oblique fracture involving the distal fibula. Tiny avulsion fracture arising from the medial malleolus. Slight lateral subluxation of the talus relative to the tibial plafond. IMPRESSION: 1. Comminuted mildly displaced oblique fracture involving the distal fibula. 2. Tiny avulsion fracture arising from the medial malleolus. 3. Slight lateral subluxation of the talus relative to the tibia. Electronically Signed   By: Evangeline Dakin M.D.   On: 08/10/2017 18:46   Ct Head Wo Contrast  Result Date: 08/09/2017 CLINICAL DATA:  Altered level of consciousness, recent falls, initial encounter EXAM: CT HEAD WITHOUT CONTRAST TECHNIQUE: Contiguous axial images were obtained from the base of the skull through the vertex without intravenous contrast. COMPARISON:  None. FINDINGS: Brain: No evidence of acute infarction, hemorrhage, hydrocephalus, extra-axial collection or mass lesion/mass effect. Vascular: No hyperdense vessel or unexpected calcification. Skull: Normal. Negative for fracture or focal lesion. Sinuses/Orbits: No acute finding. Other: None. IMPRESSION: No acute intracranial abnormality noted. Electronically Signed   By: Inez Catalina M.D.   On: 08/09/2017 11:01   Dg Chest Portable 1 View  Result Date: 08/09/2017 CLINICAL DATA:  Hypoxia, tachycardia, altered mental status. EXAM: PORTABLE CHEST 1 VIEW COMPARISON:  01/26/2017 FINDINGS: Cardiomegaly with vascular congestion and bilateral lower lobe airspace opacities which could reflect edema or infection. Suspect small right effusion. No acute bony abnormality. IMPRESSION: Cardiomegaly with vascular congestion and bilateral lower lobe opacities, edema versus infection. Small right effusion. Electronically Signed   By: Rolm Baptise M.D.   On:  08/09/2017 10:26   US Abdomen Limited Ruq  Result Date: 08/09/2017 CLINICAL DATA:  Elevated liver enzymes. EXAM: ULTRASOUND ABDOMEN LIMITED RIGHT UPPER QUADRANT COMPARISON:  09/12/2014. FINDINGS: Gallbladder: No gallstones or wall thickening visualized. No sludge noted on today's exam. No sonographic Murphy sign noted by sonographer. Common bile duct: Diameter: 3.6 mm Liver: Increase echogenicity consistent fatty infiltration and/or hepatocellular disease. No focal hepatic abnormality identified. Portal vein is patent on color Doppler imaging with normal direction of blood flow towards the liver. IMPRESSION: 1. Increased hepatic echogenicity consistent fatty infiltration or hepatocellular disease. Similar findings noted on prior exam. 2. No acute abnormality identified. No evidence of gallstones or sludge noted on today's exam. No biliary distention. Electronically Signed   By: Marcello Moores  Register   On: 08/09/2017 12:42    Cardiac Studies   ECHO PENDING  Patient Profile     56 y.o. female with a hx of chronic diastolic CHF, moderate pulmonary HTN, fibromyalgia, oxygen dependent COPD, HTN, and HLD who is being seen for the evaluation of elevated troponin, volume overload at the request of Dr. Marily Memos.   Assessment & Plan    ELEVATED TROPONIN:   Elevated but trend is flat.  No acute EKG changes on presentation.  Echo is pending.   Repeat EKG today.   ACUTE ON CHRONIC DIASTOLIC HF:  I/O about even.    AKI:    Creat is improved again today.  ELEVATED LIVER ENZYMES:  Stopped Zocor yesterday.   Liver enzymes continue to worsen.  Plan per primary team.   Signed, Minus Breeding, MD  08/11/2017, 8:21 AM

## 2017-08-11 NOTE — Anesthesia Preprocedure Evaluation (Addendum)
Anesthesia Evaluation  Patient identified by MRN, date of birth, ID band Patient awake    Reviewed: Allergy & Precautions, NPO status , Patient's Chart, lab work & pertinent test results  History of Anesthesia Complications (+) Emergence Delirium  Airway Mallampati: III  TM Distance: >3 FB Neck ROM: Full    Dental  (+) Dental Advisory Given, Teeth Intact   Pulmonary neg pulmonary ROS, shortness of breath and with exertion, sleep apnea and Continuous Positive Airway Pressure Ventilation , COPD, Current Smoker,    Pulmonary exam normal breath sounds clear to auscultation       Cardiovascular hypertension, +CHF  negative cardio ROS Normal cardiovascular exam Rhythm:Regular Rate:Normal  TTE 01/2017 - Normal EF, grade 2 diastolic dysfx, no valvulopathy noted  Cath 01/2017 - Moderate right sided heart pressure elevation with moderate pulmonary hypertension. Normal systolic function with moderate left ventricular hypertrophy with EF estimate of approximately 60%. 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   Neuro/Psych  Headaches, PSYCHIATRIC DISORDERS Depression negative neurological ROS     GI/Hepatic negative GI ROS, Elevated LFTs   Endo/Other  Obesity  Renal/GU negative Renal ROS  negative genitourinary   Musculoskeletal  (+) Fibromyalgia -  Abdominal   Peds  Hematology  (+) anemia , Thrombocytopenia   Anesthesia Other Findings   Reproductive/Obstetrics                           Anesthesia Physical Anesthesia Plan  ASA: III  Anesthesia Plan: General   Post-op Pain Management:  Regional for Post-op pain   Induction: Intravenous  PONV Risk Score and Plan: 4 or greater and Ondansetron, Dexamethasone, Midazolam, Scopolamine patch - Pre-op and Treatment may vary due to age or medical condition  Airway Management  Planned: LMA  Additional Equipment: None  Intra-op Plan:   Post-operative Plan: Extubation in OR  Informed Consent: I have reviewed the patients History and Physical, chart, labs and discussed the procedure including the risks, benefits and alternatives for the proposed anesthesia with the patient or authorized representative who has indicated his/her understanding and acceptance.   Dental advisory given  Plan Discussed with: CRNA  Anesthesia Plan Comments:         Anesthesia Quick Evaluation

## 2017-08-11 NOTE — Care Management Note (Addendum)
Case Management Note  Patient Details  Name: Teresa Roberts MRN: [id number] Date of Birth: 1961/05/05  Subjective/Objective: Pt presented for Sepsis 2/2 UTI.  C/o left ankle pain- X-rays showed an ankle fx from multiple falls. Plan for ORIF 08-12-17. PTA pt was from home with husband. Per pt she has DME RW, [place], CPAP-02 @ 2L from [place] via the [place]. PCP Dr Barbra Sarks.                  Action/Plan: CM did call the CSW to make them aware that pt is hospitalized. Awaiting call back from [place]. Will continue to monitor.   Expected Discharge Date:                  Expected Discharge Plan:  [place]  In-House Referral:  NA  Discharge planning Services  CM Consult  Post Acute Care Choice:   Home with [place] Choice offered to:   Patient  DME Arranged:   Conservation officer, nature, Wheelchair, 3n1, Tub Producer, television/film/video DME Agency:   [facility]  HH Arranged:   RN, OT, Aide HH Agency:   [facility]  Status of Service: Completed  If discussed at [place] of Stay Meetings, dates discussed:    Additional Comments: 1356 08-13-17 Jacqlyn Krauss, RN,BSN [phone] CM did speak with pt in regards to [place] and DME. Agency List Provided.  Since pt is d/c today she not be able to use her [employer] Benefit for DME. Pt will utilize her [facility] Benefit and will have a co pay. Pt is agreeable to co pay. Referral made to [facility] with [facility] and SOC to begin within 24-48 hours post d/c.  No further needs from CM at this time.  Bethena Roys, RN 08/11/2017, 2:53 PM

## 2017-08-12 ENCOUNTER — Ambulatory Visit (HOSPITAL_COMMUNITY): Payer: 59

## 2017-08-12 ENCOUNTER — Inpatient Hospital Stay (HOSPITAL_COMMUNITY): Payer: 59 | Admitting: Anesthesiology

## 2017-08-12 ENCOUNTER — Encounter (HOSPITAL_COMMUNITY)
Admission: EM | Disposition: A | Discharge status: Home / Self Care | Payer: Self-pay | Source: Home / Self Care | Attending: Internal Medicine

## 2017-08-12 ENCOUNTER — Other Ambulatory Visit: Payer: Self-pay

## 2017-08-12 ENCOUNTER — Inpatient Hospital Stay (HOSPITAL_COMMUNITY): Payer: 59

## 2017-08-12 DIAGNOSIS — S82842A Displaced bimalleolar fracture of left lower leg, initial encounter for closed fracture: Secondary | ICD-10-CM | POA: Diagnosis present

## 2017-08-12 DIAGNOSIS — R748 Abnormal levels of other serum enzymes: Secondary | ICD-10-CM

## 2017-08-12 DIAGNOSIS — I361 Nonrheumatic tricuspid (valve) insufficiency: Secondary | ICD-10-CM

## 2017-08-12 HISTORY — PX: ORIF ANKLE FRACTURE: SHX5408

## 2017-08-12 LAB — CBC WITH DIFFERENTIAL/PLATELET
BASOS ABS: 0 10*3/uL (ref 0.0–0.1)
Basophils Relative: 0 %
Eosinophils Absolute: 0 10*3/uL (ref 0.0–0.7)
Eosinophils Relative: 0 %
HCT: 35.7 % — ABNORMAL LOW (ref 36.0–46.0)
Hemoglobin: 10.7 g/dL — ABNORMAL LOW (ref 12.0–15.0)
LYMPHS ABS: 0.5 10*3/uL — AB (ref 0.7–4.0)
LYMPHS PCT: 4 %
MCH: 21.4 pg — ABNORMAL LOW (ref 26.0–34.0)
MCHC: 30 g/dL (ref 30.0–36.0)
MCV: 71.4 fL — AB (ref 78.0–100.0)
MONO ABS: 0.9 10*3/uL (ref 0.1–1.0)
MONOS PCT: 7 %
Neutro Abs: 12 10*3/uL — ABNORMAL HIGH (ref 1.7–7.7)
Neutrophils Relative %: 89 %
PLATELETS: 122 10*3/uL — AB (ref 150–400)
RBC: 5 MIL/uL (ref 3.87–5.11)
RDW: 20.9 % — AB (ref 11.5–15.5)
WBC: 13.4 10*3/uL — AB (ref 4.0–10.5)

## 2017-08-12 LAB — COMPREHENSIVE METABOLIC PANEL
ALBUMIN: 2.9 g/dL — AB (ref 3.5–5.0)
ALT: 1178 U/L — ABNORMAL HIGH (ref 14–54)
AST: 265 U/L — AB (ref 15–41)
Alkaline Phosphatase: 124 U/L (ref 38–126)
Anion gap: 7 (ref 5–15)
BUN: 21 mg/dL — AB (ref 6–20)
CHLORIDE: 102 mmol/L (ref 101–111)
CO2: 30 mmol/L (ref 22–32)
Calcium: 10.2 mg/dL (ref 8.9–10.3)
Creatinine, Ser: 0.84 mg/dL (ref 0.44–1.00)
GFR calc Af Amer: >60 mL/min (ref >60)
Glucose, Bld: 157 mg/dL — ABNORMAL HIGH (ref 65–99)
POTASSIUM: 4 mmol/L (ref 3.5–5.1)
Sodium: 139 mmol/L (ref 135–145)
Total Bilirubin: 1 mg/dL (ref 0.3–1.2)
Total Protein: 6.1 g/dL — ABNORMAL LOW (ref 6.5–8.1)

## 2017-08-12 LAB — ECHOCARDIOGRAM COMPLETE
Height: 66 in
WEIGHTICAEL: 3020.8 [oz_av]

## 2017-08-12 LAB — PROTIME-INR
INR: 1.36
Prothrombin Time: 16.6 seconds — ABNORMAL HIGH (ref 11.4–15.2)

## 2017-08-12 LAB — MAGNESIUM: MAGNESIUM: 1.9 mg/dL (ref 1.7–2.4)

## 2017-08-12 SURGERY — OPEN REDUCTION INTERNAL FIXATION (ORIF) ANKLE FRACTURE
Anesthesia: Choice | Laterality: Left

## 2017-08-12 SURGERY — OPEN REDUCTION INTERNAL FIXATION (ORIF) ANKLE FRACTURE
Anesthesia: General | Laterality: Left

## 2017-08-12 MED ORDER — ONDANSETRON HCL 4 MG/2ML IJ SOLN
INTRAMUSCULAR | Status: AC
  Filled 2017-08-12: qty 2

## 2017-08-12 MED ORDER — LIDOCAINE HCL (CARDIAC) 20 MG/ML IV SOLN
INTRAVENOUS | Status: DC | PRN
  Administered 2017-08-12: 60 mg via INTRAVENOUS

## 2017-08-12 MED ORDER — ONDANSETRON HCL 4 MG/2ML IJ SOLN
INTRAMUSCULAR | Status: DC | PRN
  Administered 2017-08-12: 4 mg via INTRAVENOUS

## 2017-08-12 MED ORDER — SODIUM CHLORIDE 0.9 % IR SOLN
Status: DC | PRN
  Administered 2017-08-12: 1000 mL

## 2017-08-12 MED ORDER — FENTANYL CITRATE (PF) 100 MCG/2ML IJ SOLN: 25.0000 ug | INTRAMUSCULAR | Status: DC | PRN

## 2017-08-12 MED ORDER — LIDOCAINE 2% (20 MG/ML) 5 ML SYRINGE
INTRAMUSCULAR | Status: AC
  Filled 2017-08-12: qty 5

## 2017-08-12 MED ORDER — CEFAZOLIN SODIUM-DEXTROSE 2-4 GM/100ML-% IV SOLN
2.0000 g | Freq: Four times a day (QID) | INTRAVENOUS | Status: AC
  Administered 2017-08-12 – 2017-08-13 (×3): 2 g via INTRAVENOUS
  Filled 2017-08-12 (×3): qty 100

## 2017-08-12 MED ORDER — METOCLOPRAMIDE HCL 5 MG/ML IJ SOLN: 5.0000 mg | Freq: Three times a day (TID) | INTRAMUSCULAR | Status: DC | PRN

## 2017-08-12 MED ORDER — BUPIVACAINE-EPINEPHRINE (PF) 0.5% -1:200000 IJ SOLN
INTRAMUSCULAR | Status: DC | PRN
  Administered 2017-08-12: 22 mL via PERINEURAL

## 2017-08-12 MED ORDER — FENTANYL CITRATE (PF) 250 MCG/5ML IJ SOLN
INTRAMUSCULAR | Status: AC
  Filled 2017-08-12: qty 5

## 2017-08-12 MED ORDER — FENTANYL CITRATE (PF) 100 MCG/2ML IJ SOLN
INTRAMUSCULAR | Status: DC | PRN
  Administered 2017-08-12: 50 ug via INTRAVENOUS

## 2017-08-12 MED ORDER — PROPOFOL 10 MG/ML IV BOLUS
INTRAVENOUS | Status: DC | PRN
  Administered 2017-08-12: 180 mg via INTRAVENOUS

## 2017-08-12 MED ORDER — PROMETHAZINE HCL 25 MG/ML IJ SOLN: 6.2500 mg | INTRAMUSCULAR | Status: DC | PRN

## 2017-08-12 MED ORDER — EPHEDRINE SULFATE 50 MG/ML IJ SOLN
INTRAMUSCULAR | Status: DC | PRN
  Administered 2017-08-12 (×2): 10 mg via INTRAVENOUS
  Administered 2017-08-12: 5 mg via INTRAVENOUS

## 2017-08-12 MED ORDER — MIDAZOLAM HCL 5 MG/5ML IJ SOLN
INTRAMUSCULAR | Status: DC | PRN
  Administered 2017-08-12: 2 mg via INTRAVENOUS

## 2017-08-12 MED ORDER — PHENYLEPHRINE HCL 10 MG/ML IJ SOLN
INTRAMUSCULAR | Status: DC | PRN
  Administered 2017-08-12 (×5): 80 ug via INTRAVENOUS

## 2017-08-12 MED ORDER — HYDROCODONE-ACETAMINOPHEN 5-325 MG PO TABS
1.0000 | ORAL_TABLET | ORAL | Status: DC | PRN
  Administered 2017-08-12 – 2017-08-13 (×3): 2 via ORAL
  Filled 2017-08-12 (×3): qty 2

## 2017-08-12 MED ORDER — ENOXAPARIN SODIUM 40 MG/0.4ML ~~LOC~~ SOLN
40.0000 mg | SUBCUTANEOUS | Status: DC
  Administered 2017-08-13: 40 mg via SUBCUTANEOUS
  Filled 2017-08-12: qty 0.4

## 2017-08-12 MED ORDER — METOCLOPRAMIDE HCL 5 MG PO TABS: 5.0000 mg | ORAL_TABLET | Freq: Three times a day (TID) | ORAL | Status: DC | PRN

## 2017-08-12 MED ORDER — PHENYLEPHRINE 40 MCG/ML (10ML) SYRINGE FOR IV PUSH (FOR BLOOD PRESSURE SUPPORT)
PREFILLED_SYRINGE | INTRAVENOUS | Status: AC
  Filled 2017-08-12: qty 10

## 2017-08-12 MED ORDER — METHYLPREDNISOLONE SODIUM SUCC 40 MG IJ SOLR
40.0000 mg | Freq: Every day | INTRAMUSCULAR | Status: DC
  Administered 2017-08-13: 40 mg via INTRAVENOUS
  Filled 2017-08-12: qty 1

## 2017-08-12 MED ORDER — PROPOFOL 10 MG/ML IV BOLUS
INTRAVENOUS | Status: AC
  Filled 2017-08-12: qty 20

## 2017-08-12 MED ORDER — MIDAZOLAM HCL 2 MG/2ML IJ SOLN
INTRAMUSCULAR | Status: AC
Start: 2017-08-12 — End: ?
  Filled 2017-08-12: qty 2

## 2017-08-12 MED ORDER — HYDROMORPHONE HCL 1 MG/ML IJ SOLN
0.5000 mg | INTRAMUSCULAR | Status: DC | PRN
  Administered 2017-08-13: 0.5 mg via INTRAVENOUS
  Filled 2017-08-12: qty 0.5

## 2017-08-12 MED ORDER — LACTATED RINGERS IV SOLN
INTRAVENOUS | Status: DC | PRN
  Administered 2017-08-12: 07:00:00 via INTRAVENOUS

## 2017-08-12 SURGICAL SUPPLY — 68 items
BAG ZIPLOCK 12X15 (MISCELLANEOUS) IMPLANT
BANDAGE ACE 4X5 VEL STRL LF (GAUZE/BANDAGES/DRESSINGS) ×3 IMPLANT
BANDAGE ACE 6X5 VEL STRL LF (GAUZE/BANDAGES/DRESSINGS) ×3 IMPLANT
BANDAGE ELASTIC 4 VELCRO ST LF (GAUZE/BANDAGES/DRESSINGS) ×3 IMPLANT
BANDAGE ELASTIC 6 VELCRO ST LF (GAUZE/BANDAGES/DRESSINGS) ×3 IMPLANT
BIT DRILL 2.5X2.75 QC CALB (BIT) ×3 IMPLANT
BIT DRILL 3.5X5.5 QC CALB (BIT) ×3 IMPLANT
BIT DRILL CALIBRATED 2.7 (BIT) ×2 IMPLANT
BIT DRILL CALIBRATED 2.7MM (BIT) ×1
BNDG COHESIVE 4X5 TAN STRL (GAUZE/BANDAGES/DRESSINGS) ×3 IMPLANT
CHLORAPREP W/TINT 26ML (MISCELLANEOUS) ×3 IMPLANT
CLOSURE WOUND 1/2 X4 (GAUZE/BANDAGES/DRESSINGS)
CUFF TOURN SGL QUICK 34 (TOURNIQUET CUFF) ×2
CUFF TRNQT CYL 34X4X40X1 (TOURNIQUET CUFF) ×1 IMPLANT
DRAPE C-ARM 42X120 X-RAY (DRAPES) ×3 IMPLANT
DRAPE C-ARMOR (DRAPES) ×3 IMPLANT
DRAPE LG THREE QUARTER DISP (DRAPES) ×3 IMPLANT
DRAPE U-SHAPE 47X51 STRL (DRAPES) ×3 IMPLANT
DRSG ADAPTIC 3X8 NADH LF (GAUZE/BANDAGES/DRESSINGS) ×3 IMPLANT
DRSG PAD ABDOMINAL 8X10 ST (GAUZE/BANDAGES/DRESSINGS) ×6 IMPLANT
ELECT REM PT RETURN 9FT ADLT (ELECTROSURGICAL) ×3
ELECTRODE REM PT RTRN 9FT ADLT (ELECTROSURGICAL) ×1 IMPLANT
FIXATION ZIPTIGHT ANKLE SNDSMS (Ankle) ×1 IMPLANT
GAUZE SPONGE 4X4 12PLY STRL (GAUZE/BANDAGES/DRESSINGS) ×6 IMPLANT
GAUZE SPONGE 4X4 12PLY STRL LF (GAUZE/BANDAGES/DRESSINGS) ×3 IMPLANT
GAUZE XEROFORM 1X8 LF (GAUZE/BANDAGES/DRESSINGS) ×3 IMPLANT
GLOVE BIOGEL PI IND STRL 7.5 (GLOVE) ×1 IMPLANT
GLOVE BIOGEL PI IND STRL 8.5 (GLOVE) ×1 IMPLANT
GLOVE BIOGEL PI INDICATOR 7.5 (GLOVE) ×2
GLOVE BIOGEL PI INDICATOR 8.5 (GLOVE) ×2
GLOVE ECLIPSE 8.0 STRL XLNG CF (GLOVE) IMPLANT
GLOVE ORTHO TXT STRL SZ7.5 (GLOVE) ×6 IMPLANT
GLOVE SURG ORTHO 8.5 STRL (GLOVE) ×9 IMPLANT
GOWN SPEC L3 XXLG W/TWL (GOWN DISPOSABLE) ×6 IMPLANT
GOWN STRL REUS W/TWL LRG LVL3 (GOWN DISPOSABLE) ×3 IMPLANT
K-WIRE ACE 1.6X6 (WIRE) ×3
KWIRE ACE 1.6X6 (WIRE) ×1 IMPLANT
MANIFOLD NEPTUNE II (INSTRUMENTS) ×3 IMPLANT
NS IRRIG 1000ML POUR BTL (IV SOLUTION) ×3 IMPLANT
PACK TOTAL KNEE CUSTOM (KITS) ×3 IMPLANT
PAD CAST 4YDX4 CTTN HI CHSV (CAST SUPPLIES) ×2 IMPLANT
PADDING CAST ABS 6INX4YD NS (CAST SUPPLIES) ×4
PADDING CAST ABS COTTON 6X4 NS (CAST SUPPLIES) ×2 IMPLANT
PADDING CAST COTTON 4X4 STRL (CAST SUPPLIES) ×4
PADDING CAST SYNTHETIC 4 (CAST SUPPLIES) ×2
PADDING CAST SYNTHETIC 4X4 STR (CAST SUPPLIES) ×1 IMPLANT
PLATE LOCK 8H 103 BILAT FIB (Plate) ×3 IMPLANT
POSITIONER SURGICAL ARM (MISCELLANEOUS) ×3 IMPLANT
SCREW CORTICAL 3.5MM  28MM (Screw) ×2 IMPLANT
SCREW CORTICAL 3.5MM 24MM (Screw) ×3 IMPLANT
SCREW CORTICAL 3.5MM 28MM (Screw) ×1 IMPLANT
SCREW LOCK 3.5X14 DIST TIB (Screw) ×3 IMPLANT
SCREW LOCK 3.5X16 DIST TIB (Screw) ×3 IMPLANT
SCREW NON LOCKING LP 3.5 14MM (Screw) ×9 IMPLANT
SCREW NON LOCKING LP 3.5 16MM (Screw) ×3 IMPLANT
SOL PREP POV-IOD 4OZ 10% (MISCELLANEOUS) ×3 IMPLANT
STAPLER VISISTAT 35W (STAPLE) ×3 IMPLANT
STRIP CLOSURE SKIN 1/2X4 (GAUZE/BANDAGES/DRESSINGS) IMPLANT
SUT ETHILON 3 0 PS 1 (SUTURE) ×3 IMPLANT
SUT MNCRL AB 4-0 PS2 18 (SUTURE) ×3 IMPLANT
SUT VIC AB 1 CT1 27 (SUTURE) ×4
SUT VIC AB 1 CT1 27XBRD ANTBC (SUTURE) ×2 IMPLANT
SUT VIC AB 2-0 CT1 27 (SUTURE) ×2
SUT VIC AB 2-0 CT1 TAPERPNT 27 (SUTURE) ×1 IMPLANT
SUT VIC AB 3-0 PS2 18 (SUTURE) ×2
SUT VIC AB 3-0 PS2 18XBRD (SUTURE) ×1 IMPLANT
TOWEL OR 17X26 10 PK STRL BLUE (TOWEL DISPOSABLE) ×6 IMPLANT
ZIPTIGHT ANKLE SYNODESMOSS FIX (Ankle) ×3 IMPLANT

## 2017-08-12 NOTE — Anesthesia Procedure Notes (Signed)
Procedure Name: LMA Insertion Date/Time: 08/12/2017 7:40 AM Performed by: Manus Gunning, Deaken Jurgens J Pre-anesthesia Checklist: Patient identified, Emergency Drugs available, Suction available, Timeout performed and Patient being monitored Patient Re-evaluated:Patient Re-evaluated prior to induction Oxygen Delivery Method: Circle system utilized Preoxygenation: Pre-oxygenation with 100% oxygen Induction Type: IV induction Ventilation: Mask ventilation without difficulty LMA: LMA inserted LMA Size: 4.0 Number of attempts: 1 Placement Confirmation: positive ETCO2 and breath sounds checked- equal and bilateral Tube secured with: Tape Dental Injury: Teeth and Oropharynx as per pre-operative assessment

## 2017-08-12 NOTE — Transfer of Care (Signed)
Immediate Anesthesia Transfer of Care Note  Patient: Teresa Roberts  Procedure(s) Performed: Procedure(s): OPEN REDUCTION INTERNAL FIXATION (ORIF) ANKLE FRACTURE (Left)  Patient Location: PACU  Anesthesia Type:General  Level of Consciousness: awake  Airway & Oxygen Therapy: Patient Spontanous Breathing and Patient connected to nasal cannula oxygen  Post-op Assessment: Report given to RN and Post -op Vital signs reviewed and stable  Post vital signs: Reviewed and stable  Last Vitals:  Vitals:   08/11/17 2120 08/12/17 0500  BP:  109/77  Pulse:  61  Resp:  18  Temp:  36.6 C  SpO2: 95% 95%    Last Pain:  Vitals:   08/12/17 0500  TempSrc: Oral  PainSc:          Complications: No apparent anesthesia complications

## 2017-08-12 NOTE — Progress Notes (Signed)
PT refused CPAP.  

## 2017-08-12 NOTE — H&P (View-Only) (Signed)
Reason for Consult:Ankle fx Referring Physician: [name] is an 56 y.o. female.  HPI: Teresa Roberts was admitted 2d ago 2/2 sepsis likely from PNA. She was confused and somnolent on arrival. As she has improved she began to c/o left ankle pain. X-rays showed an ankle fx. Her son notes she fell multiple times over the weekend.  Past Medical History:  Diagnosis Date  . COPD (chronic obstructive pulmonary disease) ([place])    O2 dependent  . cpap   . Depression    PTSD  . Diastolic CHF ([place])   . Fibromyalgia   . Hyperlipidemia   . Hypertension   . Migraines     Past Surgical History:  Procedure Laterality Date  . APPENDECTOMY    . CESAREAN SECTION    . RIGHT/LEFT HEART CATH AND CORONARY ANGIOGRAPHY N/A 01/27/2017   Procedure: Right/Left Heart Cath and Coronary Angiography;  Surgeon: Troy Sine, MD;  Location: [facility];  Service: Cardiovascular;  Laterality: N/A;    Family History  Problem Relation Age of Onset  . Diabetes Mother   . Hypertension Mother   . CAD Mother   . Diabetes Father   . Lung cancer Father   . Diabetes Sister     Social History:  reports that she has been smoking Cigarettes.  She has a 39.00 pack-year smoking history. She uses smokeless tobacco. She reports that she drinks about 0.6 oz of alcohol per week . She reports that she does not use drugs.  Allergies:  Allergies  Allergen Reactions  . Citrus Hives  . Lisinopril Cough    Medications: I have reviewed the patient's current medications.  Results for orders placed or performed during the hospital encounter of 08/09/17 (from the past 48 hour(s))  Acetaminophen level     Status: Abnormal   Collection Time: 08/09/17  9:10 AM  Result Value Ref Range   Acetaminophen (Tylenol), Serum <10 (L) 10 - 30 ug/mL    Comment:        THERAPEUTIC CONCENTRATIONS VARY SIGNIFICANTLY. A RANGE OF 10-30 ug/mL MAY BE AN EFFECTIVE CONCENTRATION FOR MANY PATIENTS. HOWEVER, SOME ARE BEST TREATED AT  CONCENTRATIONS OUTSIDE THIS RANGE. ACETAMINOPHEN CONCENTRATIONS >150 ug/mL AT 4 HOURS AFTER INGESTION AND >50 ug/mL AT 12 HOURS AFTER INGESTION ARE OFTEN ASSOCIATED WITH TOXIC REACTIONS.   Salicylate level     Status: None   Collection Time: 08/09/17  9:10 AM  Result Value Ref Range   Salicylate Lvl <3.2 2.8 - 30.0 mg/dL  CBG monitoring, ED     Status: Abnormal   Collection Time: 08/09/17  9:46 AM  Result Value Ref Range   Glucose-Capillary 106 (H) 65 - 99 mg/dL  CBC with Differential     Status: Abnormal   Collection Time: 08/09/17  9:55 AM  Result Value Ref Range   WBC 16.3 (H) 4.0 - 10.5 K/uL   RBC 5.18 (H) 3.87 - 5.11 MIL/uL   Hemoglobin 11.3 (L) 12.0 - 15.0 g/dL   HCT 37.0 36.0 - 46.0 %   MCV 71.4 (L) 78.0 - 100.0 fL   MCH 21.8 (L) 26.0 - 34.0 pg   MCHC 30.5 30.0 - 36.0 g/dL   RDW 20.2 (H) 11.5 - 15.5 %   Platelets 163 150 - 400 K/uL   Neutrophils Relative % 82 %   Lymphocytes Relative 8 %   Monocytes Relative 10 %   Eosinophils Relative 0 %   Basophils Relative 0 %   Neutro Abs 13.4 (  H) 1.7 - 7.7 K/uL   Lymphs Abs 1.3 0.7 - 4.0 K/uL   Monocytes Absolute 1.6 (H) 0.1 - 1.0 K/uL   Eosinophils Absolute 0.0 0.0 - 0.7 K/uL   Basophils Absolute 0.0 0.0 - 0.1 K/uL   RBC Morphology POLYCHROMASIA PRESENT     Comment: TARGET CELLS RARE NRBCs   Comprehensive metabolic panel     Status: Abnormal   Collection Time: 08/09/17  9:55 AM  Result Value Ref Range   Sodium 139 135 - 145 mmol/L   Potassium 4.7 3.5 - 5.1 mmol/L   Chloride 103 101 - 111 mmol/L   CO2 21 (L) 22 - 32 mmol/L   Glucose, Bld 104 (H) 65 - 99 mg/dL   BUN 81 (H) 6 - 20 mg/dL   Creatinine, Ser 2.45 (H) 0.44 - 1.00 mg/dL   Calcium 9.9 8.9 - 10.3 mg/dL   Total Protein 6.9 6.5 - 8.1 g/dL   Albumin 3.5 3.5 - 5.0 g/dL   AST 482 (H) 15 - 41 U/L   ALT 405 (H) 14 - 54 U/L   Alkaline Phosphatase 149 (H) 38 - 126 U/L   Total Bilirubin 2.7 (H) 0.3 - 1.2 mg/dL   GFR calc non Af Amer 21 (L) >60 mL/min   GFR calc  Af Amer 24 (L) >60 mL/min    Comment: (NOTE) The eGFR has been calculated using the CKD EPI equation. This calculation has not been validated in all clinical situations. eGFR's persistently <60 mL/min signify possible Chronic Kidney Disease.    Anion gap 15 5 - 15  Lipase, blood     Status: Abnormal   Collection Time: 08/09/17  9:55 AM  Result Value Ref Range   Lipase 57 (H) 11 - 51 U/L  Protime-INR     Status: Abnormal   Collection Time: 08/09/17  9:55 AM  Result Value Ref Range   Prothrombin Time 20.1 (H) 11.4 - 15.2 seconds   INR 1.73   CK     Status: None   Collection Time: 08/09/17  9:55 AM  Result Value Ref Range   Total CK 183 38 - 234 U/L  Brain natriuretic peptide     Status: Abnormal   Collection Time: 08/09/17  9:56 AM  Result Value Ref Range   B Natriuretic Peptide 2,139.4 (H) 0.0 - 100.0 pg/mL  Blood culture (routine x 2)     Status: None (Preliminary result)   Collection Time: 08/09/17 10:00 AM  Result Value Ref Range   Specimen Description BLOOD RIGHT ANTECUBITAL    Special Requests      BOTTLES DRAWN AEROBIC AND ANAEROBIC Blood Culture adequate volume   Culture NO GROWTH 1 DAY    Report Status PENDING   I-stat troponin, ED     Status: Abnormal   Collection Time: 08/09/17 10:07 AM  Result Value Ref Range   Troponin i, poc 0.78 (HH) 0.00 - 0.08 ng/mL   Comment NOTIFIED PHYSICIAN    Comment 3            Comment: Due to the release kinetics of cTnI, a negative result within the first hours of the onset of symptoms does not rule out myocardial infarction with certainty. If myocardial infarction is still suspected, repeat the test at appropriate intervals.   I-Stat venous blood gas, ED     Status: Abnormal   Collection Time: 08/09/17 10:09 AM  Result Value Ref Range   pH, Ven 7.273 7.250 - 7.430   pCO2, Ven 54.9  44.0 - 60.0 mmHg   pO2, Ven 113.0 (H) 32.0 - 45.0 mmHg   Bicarbonate 25.4 20.0 - 28.0 mmol/L   TCO2 27 22 - 32 mmol/L   O2 Saturation 98.0 %    Acid-base deficit 2.0 0.0 - 2.0 mmol/L   Patient temperature HIDE    Sample type VENOUS   I-Stat CG4 Lactic Acid, ED     Status: Abnormal   Collection Time: 08/09/17 10:10 AM  Result Value Ref Range   Lactic Acid, Venous 4.55 (HH) 0.5 - 1.9 mmol/L   Comment NOTIFIED PHYSICIAN   Blood culture (routine x 2)     Status: None (Preliminary result)   Collection Time: 08/09/17 10:15 AM  Result Value Ref Range   Specimen Description BLOOD RIGHT FOREARM    Special Requests      BOTTLES DRAWN AEROBIC AND ANAEROBIC Blood Culture adequate volume   Culture NO GROWTH 1 DAY    Report Status PENDING   .Cooxemetry Panel (carboxy, met, total hgb, O2 sat)     Status: Abnormal   Collection Time: 08/09/17 10:25 AM  Result Value Ref Range   Total hemoglobin 11.5 (L) 12.0 - 16.0 g/dL   O2 Saturation 98.9 %   Carboxyhemoglobin 4.6 (H) 0.5 - 1.5 %   Methemoglobin 1.0 0.0 - 1.5 %  Urinalysis, Routine w reflex microscopic     Status: Abnormal   Collection Time: 08/09/17 10:39 AM  Result Value Ref Range   Color, Urine YELLOW YELLOW   APPearance CLEAR CLEAR   Specific Gravity, Urine 1.016 1.005 - 1.030   pH 5.0 5.0 - 8.0   Glucose, UA NEGATIVE NEGATIVE mg/dL   Hgb urine dipstick MODERATE (A) NEGATIVE   Bilirubin Urine NEGATIVE NEGATIVE   Ketones, ur NEGATIVE NEGATIVE mg/dL   Protein, ur NEGATIVE NEGATIVE mg/dL   Nitrite NEGATIVE NEGATIVE   Leukocytes, UA MODERATE (A) NEGATIVE   RBC / HPF 6-30 0 - 5 RBC/hpf   WBC, UA TOO NUMEROUS TO COUNT 0 - 5 WBC/hpf   Bacteria, UA MANY (A) NONE SEEN   Squamous Epithelial / LPF NONE SEEN NONE SEEN   WBC Clumps PRESENT    Mucus PRESENT    Hyaline Casts, UA PRESENT   Urine culture     Status: Abnormal   Collection Time: 08/09/17 10:39 AM  Result Value Ref Range   Specimen Description URINE, RANDOM    Special Requests NONE    Culture MULTIPLE SPECIES PRESENT, SUGGEST RECOLLECTION (A)    Report Status 08/10/2017 FINAL   Ammonia     Status: Abnormal   Collection  Time: 08/09/17 12:05 PM  Result Value Ref Range   Ammonia 54 (H) 9 - 35 umol/L  Hepatitis panel, acute     Status: None   Collection Time: 08/09/17 12:59 PM  Result Value Ref Range   Hepatitis B Surface Ag Negative Negative   HCV Ab <0.1 0.0 - 0.9 s/co ratio    Comment: (NOTE)                                  Negative:     < 0.8                             Indeterminate: 0.8 - 0.9  Positive:     > 0.9 The CDC recommends that a positive HCV antibody result be followed up with a HCV Nucleic Acid Amplification test (016553). Performed At: [facility] [place], [place] [id number] Lindon Romp MD LJ:[id number]    Hep A IgM Negative Negative   Hep B C IgM Negative Negative  Ammonia     Status: Abnormal   Collection Time: 08/09/17 12:59 PM  Result Value Ref Range   Ammonia 48 (H) 9 - 35 umol/L  HIV antibody (Routine Testing)     Status: None   Collection Time: 08/09/17 12:59 PM  Result Value Ref Range   HIV Screen 4th Generation wRfx Non Reactive Non Reactive    Comment: (NOTE) Performed At: [facility] [address] [place], [place] [id number] Lindon Romp MD PQ:[id number]   Lactic acid, plasma     Status: Abnormal   Collection Time: 08/09/17 12:59 PM  Result Value Ref Range   Lactic Acid, Venous 2.8 (HH) 0.5 - 1.9 mmol/L    Comment: CRITICAL RESULT CALLED TO, READ BACK BY AND VERIFIED WITH: H.BOWMAN,RN 08/09/17 1348 BY BSLADE   Procalcitonin     Status: None   Collection Time: 08/09/17 12:59 PM  Result Value Ref Range   Procalcitonin 0.52 ng/mL    Comment:        Interpretation: PCT > 0.5 ng/mL and <= 2 ng/mL: Systemic infection (sepsis) is possible, but other conditions are known to elevate PCT as well. (NOTE)         ICU PCT Algorithm               Non ICU PCT Algorithm    ----------------------------     ------------------------------         PCT < 0.25 ng/mL                 PCT < 0.1 ng/mL      Stopping of antibiotics            Stopping of antibiotics       strongly encouraged.               strongly encouraged.    ----------------------------     ------------------------------       PCT level decrease by               PCT < 0.25 ng/mL       >= 80% from peak PCT       OR PCT 0.25 - 0.5 ng/mL          Stopping of antibiotics                                             encouraged.     Stopping of antibiotics           encouraged.    ----------------------------     ------------------------------       PCT level decrease by              PCT >= 0.25 ng/mL       < 80% from peak PCT        AND PCT >= 0.5 ng/mL             Continuing antibiotics  encouraged.       Continuing antibiotics            encouraged.    ----------------------------     ------------------------------     PCT level increase compared          PCT > 0.5 ng/mL         with peak PCT AND          PCT >= 0.5 ng/mL             Escalation of antibiotics                                          strongly encouraged.      Escalation of antibiotics        strongly encouraged.   Magnesium     Status: Abnormal   Collection Time: 08/09/17 12:59 PM  Result Value Ref Range   Magnesium 2.5 (H) 1.7 - 2.4 mg/dL  Phosphorus     Status: None   Collection Time: 08/09/17 12:59 PM  Result Value Ref Range   Phosphorus 3.3 2.5 - 4.6 mg/dL  Troponin I     Status: Abnormal   Collection Time: 08/09/17 12:59 PM  Result Value Ref Range   Troponin I 1.13 (HH) <0.03 ng/mL    Comment: CRITICAL RESULT CALLED TO, READ BACK BY AND VERIFIED WITH: H.BOWMAN,RN 1402 08/09/17 CLARK,S   Legionella Pneumophila Serogp 1 Ur Ag     Status: None   Collection Time: 08/09/17  1:01 PM  Result Value Ref Range   L. pneumophila Serogp 1 Ur Ag Negative Negative    Comment: (NOTE) Presumptive negative for L. pneumophila serogroup 1 antigen in urine, suggesting no recent or current infection. Legionnaires'  disease cannot be ruled out since other serogroups and species may also cause disease. Performed At: [facility] [address] [place], [place] [id number] Lindon Romp MD XK:[id number]    Source of Sample NONE   Strep pneumoniae urinary antigen     Status: None   Collection Time: 08/09/17  1:02 PM  Result Value Ref Range   Strep Pneumo Urinary Antigen NEGATIVE NEGATIVE    Comment:        Infection due to S. pneumoniae cannot be absolutely ruled out since the antigen present may be below the detection limit of the test.   I-stat troponin, ED     Status: Abnormal   Collection Time: 08/09/17  1:18 PM  Result Value Ref Range   Troponin i, poc 1.11 (HH) 0.00 - 0.08 ng/mL   Comment NOTIFIED PHYSICIAN    Comment 3            Comment: Due to the release kinetics of cTnI, a negative result within the first hours of the onset of symptoms does not rule out myocardial infarction with certainty. If myocardial infarction is still suspected, repeat the test at appropriate intervals.   I-Stat CG4 Lactic Acid, ED     Status: Abnormal   Collection Time: 08/09/17  1:20 PM  Result Value Ref Range   Lactic Acid, Venous 2.89 (HH) 0.5 - 1.9 mmol/L   Comment NOTIFIED PHYSICIAN   Lactic acid, plasma     Status: Abnormal   Collection Time: 08/09/17  4:41 PM  Result Value Ref Range   Lactic Acid, Venous 2.2 (HH) 0.5 - 1.9 mmol/L    Comment: CRITICAL RESULT CALLED  TO, READ BACK BY AND VERIFIED WITH: H BOWMAN,RN 1735 08/09/2017 WBOND   Troponin I     Status: Abnormal   Collection Time: 08/09/17  7:12 PM  Result Value Ref Range   Troponin I 1.62 (HH) <0.03 ng/mL    Comment: CRITICAL VALUE NOTED.  VALUE IS CONSISTENT WITH PREVIOUSLY REPORTED AND CALLED VALUE.  MRSA PCR Screening     Status: None   Collection Time: 08/09/17 10:51 PM  Result Value Ref Range   MRSA by PCR NEGATIVE NEGATIVE    Comment:        The GeneXpert MRSA Assay (FDA approved for NASAL specimens only), is one  component of a comprehensive MRSA colonization surveillance program. It is not intended to diagnose MRSA infection nor to guide or monitor treatment for MRSA infections.   Troponin I     Status: Abnormal   Collection Time: 08/10/17 12:54 AM  Result Value Ref Range   Troponin I 1.67 (HH) <0.03 ng/mL    Comment: CRITICAL VALUE NOTED.  VALUE IS CONSISTENT WITH PREVIOUSLY REPORTED AND CALLED VALUE.  CBC     Status: Abnormal   Collection Time: 08/10/17 12:54 AM  Result Value Ref Range   WBC 11.8 (H) 4.0 - 10.5 K/uL   RBC 4.75 3.87 - 5.11 MIL/uL   Hemoglobin 10.0 (L) 12.0 - 15.0 g/dL   HCT 33.8 (L) 36.0 - 46.0 %   MCV 71.2 (L) 78.0 - 100.0 fL   MCH 21.1 (L) 26.0 - 34.0 pg   MCHC 29.6 (L) 30.0 - 36.0 g/dL   RDW 19.8 (H) 11.5 - 15.5 %   Platelets 116 (L) 150 - 400 K/uL    Comment: PLATELET COUNT CONFIRMED BY SMEAR  Comprehensive metabolic panel     Status: Abnormal   Collection Time: 08/10/17 12:54 AM  Result Value Ref Range   Sodium 143 135 - 145 mmol/L   Potassium 3.1 (L) 3.5 - 5.1 mmol/L    Comment: DELTA CHECK NOTED   Chloride 102 101 - 111 mmol/L   CO2 31 22 - 32 mmol/L   Glucose, Bld 88 65 - 99 mg/dL   BUN 48 (H) 6 - 20 mg/dL   Creatinine, Ser 1.34 (H) 0.44 - 1.00 mg/dL    Comment: DELTA CHECK NOTED   Calcium 9.5 8.9 - 10.3 mg/dL   Total Protein 5.9 (L) 6.5 - 8.1 g/dL   Albumin 3.0 (L) 3.5 - 5.0 g/dL   AST 904 (H) 15 - 41 U/L   ALT 793 (H) 14 - 54 U/L   Alkaline Phosphatase 135 (H) 38 - 126 U/L   Total Bilirubin 2.1 (H) 0.3 - 1.2 mg/dL   GFR calc non Af Amer 44 (L) >60 mL/min   GFR calc Af Amer 51 (L) >60 mL/min    Comment: (NOTE) The eGFR has been calculated using the CKD EPI equation. This calculation has not been validated in all clinical situations. eGFR's persistently <60 mL/min signify possible Chronic Kidney Disease.    Anion gap 10 5 - 15  Lactic acid, plasma     Status: None   Collection Time: 08/10/17 12:54 AM  Result Value Ref Range   Lactic Acid,  Venous 0.8 0.5 - 1.9 mmol/L  CBC with Differential/Platelet     Status: Abnormal   Collection Time: 08/11/17  5:59 AM  Result Value Ref Range   WBC 8.6 4.0 - 10.5 K/uL   RBC 4.99 3.87 - 5.11 MIL/uL   Hemoglobin 10.7 (L) 12.0 - 15.0  g/dL   HCT 35.5 (L) 36.0 - 46.0 %   MCV 71.1 (L) 78.0 - 100.0 fL   MCH 21.4 (L) 26.0 - 34.0 pg   MCHC 30.1 30.0 - 36.0 g/dL   RDW 20.8 (H) 11.5 - 15.5 %   Platelets 118 (L) 150 - 400 K/uL    Comment: REPEATED TO VERIFY CONSISTENT WITH PREVIOUS RESULT    Neutrophils Relative % 94 %   Neutro Abs 8.0 (H) 1.7 - 7.7 K/uL   Lymphocytes Relative 3 %   Lymphs Abs 0.3 (L) 0.7 - 4.0 K/uL   Monocytes Relative 3 %   Monocytes Absolute 0.3 0.1 - 1.0 K/uL   Eosinophils Relative 0 %   Eosinophils Absolute 0.0 0.0 - 0.7 K/uL   Basophils Relative 0 %   Basophils Absolute 0.0 0.0 - 0.1 K/uL  Comprehensive metabolic panel     Status: Abnormal   Collection Time: 08/11/17  5:59 AM  Result Value Ref Range   Sodium 139 135 - 145 mmol/L   Potassium 3.9 3.5 - 5.1 mmol/L   Chloride 102 101 - 111 mmol/L   CO2 27 22 - 32 mmol/L   Glucose, Bld 140 (H) 65 - 99 mg/dL   BUN 22 (H) 6 - 20 mg/dL   Creatinine, Ser 0.79 0.44 - 1.00 mg/dL   Calcium 10.0 8.9 - 10.3 mg/dL   Total Protein 6.3 (L) 6.5 - 8.1 g/dL   Albumin 2.9 (L) 3.5 - 5.0 g/dL   AST 922 (H) 15 - 41 U/L   ALT 1,486 (H) 14 - 54 U/L   Alkaline Phosphatase 130 (H) 38 - 126 U/L   Total Bilirubin 1.5 (H) 0.3 - 1.2 mg/dL   GFR calc non Af Amer >60 >60 mL/min   GFR calc Af Amer >60 >60 mL/min    Comment: (NOTE) The eGFR has been calculated using the CKD EPI equation. This calculation has not been validated in all clinical situations. eGFR's persistently <60 mL/min signify possible Chronic Kidney Disease.    Anion gap 10 5 - 15  Magnesium     Status: None   Collection Time: 08/11/17  5:59 AM  Result Value Ref Range   Magnesium 2.0 1.7 - 2.4 mg/dL    Dg Chest 2 View  Result Date: 08/10/2017 CLINICAL DATA:   Shortness of breath. History of stepped cysts and CHF. Also COPD current smoker. EXAM: CHEST  2 VIEW COMPARISON:  Portable chest x-ray of August 09, 2017 FINDINGS: The lungs are adequately inflated. There is bilateral hilar prominence which is stable. The pulmonary vascularity appears engorged with some cephalization. The interstitial markings are less prominent today. There remain increased densities lateral to the left heart border. IMPRESSION: Slight interval improvement in the appearance of the chest with decreased interstitial edema. Persistent bilateral hilar prominence may reflect the congested pulmonary vascularity though underlying lymphadenopathy is not excluded. Electronically Signed   By: David  Martinique M.D.   On: 08/10/2017 07:50   Dg Ankle 2 Views Left  Result Date: 08/10/2017 CLINICAL DATA:  56 year old who fell 5 days ago. Persistent left ankle pain. Initial encounter. EXAM: LEFT ANKLE - 2 VIEW COMPARISON:  None. FINDINGS: Lateral soft tissue swelling. Comminuted mildly displaced oblique fracture involving the distal fibula. Tiny avulsion fracture arising from the medial malleolus. Slight lateral subluxation of the talus relative to the tibial plafond. IMPRESSION: 1. Comminuted mildly displaced oblique fracture involving the distal fibula. 2. Tiny avulsion fracture arising from the medial malleolus. 3. Slight lateral subluxation  of the talus relative to the tibia. Electronically Signed   By: Evangeline Dakin M.D.   On: 08/10/2017 18:46   Ct Head Wo Contrast  Result Date: 08/09/2017 CLINICAL DATA:  Altered level of consciousness, recent falls, initial encounter EXAM: CT HEAD WITHOUT CONTRAST TECHNIQUE: Contiguous axial images were obtained from the base of the skull through the vertex without intravenous contrast. COMPARISON:  None. FINDINGS: Brain: No evidence of acute infarction, hemorrhage, hydrocephalus, extra-axial collection or mass lesion/mass effect. Vascular: No hyperdense vessel or  unexpected calcification. Skull: Normal. Negative for fracture or focal lesion. Sinuses/Orbits: No acute finding. Other: None. IMPRESSION: No acute intracranial abnormality noted. Electronically Signed   By: Inez Catalina M.D.   On: 08/09/2017 11:01   Dg Chest Portable 1 View  Result Date: 08/09/2017 CLINICAL DATA:  Hypoxia, tachycardia, altered mental status. EXAM: PORTABLE CHEST 1 VIEW COMPARISON:  01/26/2017 FINDINGS: Cardiomegaly with vascular congestion and bilateral lower lobe airspace opacities which could reflect edema or infection. Suspect small right effusion. No acute bony abnormality. IMPRESSION: Cardiomegaly with vascular congestion and bilateral lower lobe opacities, edema versus infection. Small right effusion. Electronically Signed   By: Rolm Baptise M.D.   On: 08/09/2017 10:26   US Abdomen Limited Ruq  Result Date: 08/09/2017 CLINICAL DATA:  Elevated liver enzymes. EXAM: ULTRASOUND ABDOMEN LIMITED RIGHT UPPER QUADRANT COMPARISON:  09/12/2014. FINDINGS: Gallbladder: No gallstones or wall thickening visualized. No sludge noted on today's exam. No sonographic Murphy sign noted by sonographer. Common bile duct: Diameter: 3.6 mm Liver: Increase echogenicity consistent fatty infiltration and/or hepatocellular disease. No focal hepatic abnormality identified. Portal vein is patent on color Doppler imaging with normal direction of blood flow towards the liver. IMPRESSION: 1. Increased hepatic echogenicity consistent fatty infiltration or hepatocellular disease. Similar findings noted on prior exam. 2. No acute abnormality identified. No evidence of gallstones or sludge noted on today's exam. No biliary distention. Electronically Signed   By: Marcello Moores  Register   On: 08/09/2017 12:42    Review of Systems  Constitutional: Negative for weight loss.  HENT: Negative for ear discharge, ear pain, hearing loss and tinnitus.   Eyes: Negative for blurred vision, double vision, photophobia and pain.   Respiratory: Negative for cough, sputum production and shortness of breath.   Cardiovascular: Negative for chest pain.  Gastrointestinal: Negative for abdominal pain, nausea and vomiting.  Genitourinary: Negative for dysuria, flank pain, frequency and urgency.  Musculoskeletal: Positive for joint pain (Left ankle). Negative for back pain, falls, myalgias and neck pain.  Neurological: Negative for dizziness, tingling, sensory change, focal weakness, loss of consciousness and headaches.  Endo/Heme/Allergies: Does not bruise/bleed easily.  Psychiatric/Behavioral: Negative for depression, memory loss and substance abuse. The patient is not nervous/anxious.    Blood pressure 99/64, pulse (!) 54, temperature 98.1 F (36.7 C), temperature source Oral, resp. rate 20, height 5' 6" (1.676 m), weight 86 kg (189 lb 11.2 oz), last menstrual period 08/19/2012, SpO2 93 %. Physical Exam  Constitutional: She appears well-developed and well-nourished. No distress.  HENT:  Head: Normocephalic.  Eyes: Conjunctivae are normal. Right eye exhibits no discharge. Left eye exhibits no discharge. No scleral icterus.  Cardiovascular: Normal rate and regular rhythm.   Respiratory: Effort normal. No respiratory distress.  Musculoskeletal:  LLE No traumatic wounds, ecchymosis, or rash  Mild TTP fibular head, moderate TTP ankle  No knee effusion, ankle mildly edematous  Knee stable to varus/ valgus and anterior/posterior stress  Sens DPN, SPN, TN intact  Motor EHL, ext, flex, evers  5/5  DP 2+, PT 2+   Neurological: She is alert.  Skin: Skin is warm and dry. She is not diaphoretic.  Psychiatric: She has a normal mood and affect. Her behavior is normal.    Assessment/Plan: Left ankle fx -- Will need ORIF tomorrow morning by Dr. Lyla Glassing. NPO after MN.    Lisette Abu, PA-C Orthopedic Surgery [phone] 08/11/2017, 8:56 AM

## 2017-08-12 NOTE — Anesthesia Postprocedure Evaluation (Signed)
Anesthesia Post Note  Patient: Teresa Roberts  Procedure(s) Performed: Procedure(s) (LRB): OPEN REDUCTION INTERNAL FIXATION (ORIF) ANKLE FRACTURE (Left)     Patient location during evaluation: PACU Anesthesia Type: General Level of consciousness: awake and alert Pain management: pain level controlled Vital Signs Assessment: post-procedure vital signs reviewed and stable Respiratory status: spontaneous breathing, nonlabored ventilation, respiratory function stable and patient connected to nasal cannula oxygen Cardiovascular status: blood pressure returned to baseline and stable Postop Assessment: no apparent nausea or vomiting Anesthetic complications: no    Last Vitals:  Vitals:   08/12/17 0915 08/12/17 0930  BP: 112/77 129/84  Pulse:    Resp:    Temp:  36.5 C  SpO2:      Last Pain:  Vitals:   08/12/17 0906  TempSrc:   PainSc: 0-No pain                 Audry Pili

## 2017-08-12 NOTE — Progress Notes (Signed)
Progress Note  Patient Name: Teresa Roberts Date of Encounter: 08/12/2017  Primary Cardiologist:   Dr. Johnsie Cancel   Inpatient Medications    Scheduled Meds: . ARIPiprazole  5 mg Oral Daily  . busPIRone  15 mg Oral BID  . chlorhexidine  60 mL Topical Once  . fluticasone furoate-vilanterol  1 puff Inhalation Daily   And  . umeclidinium bromide  1 puff Inhalation Daily  . gabapentin  800 mg Oral BID  . levofloxacin  500 mg Oral Daily  . mouth rinse  15 mL Mouth Rinse BID  . methylPREDNISolone (SOLU-MEDROL) injection  40 mg Intravenous Q12H  . pantoprazole  40 mg Oral Daily  . povidone-iodine  2 application Topical Once   Continuous Infusions: .  ceFAZolin (ANCEF) IV     PRN Meds: acetaminophen **OR** acetaminophen, ipratropium-albuterol, methocarbamol, ondansetron **OR** ondansetron (ZOFRAN) IV, traMADol, traZODone   Vital Signs    Vitals:   08/11/17 1148 08/11/17 2050 08/11/17 2120 08/12/17 0500  BP: 107/61 98/66  109/77  Pulse:  73  61  Resp:  18  18  Temp: 98 F (36.7 C) 98.1 F (36.7 C)    TempSrc: Oral Oral  Oral  SpO2: 100%  95% 95%  Weight:    188 lb 12.8 oz (85.6 kg)  Height:        Intake/Output Summary (Last 24 hours) at 08/12/17 0540 Last data filed at 08/12/17 0538  Gross per 24 hour  Intake                0 ml  Output              150 ml  Net             -150 ml   Filed Weights   08/10/17 0511 08/11/17 0428 08/12/17 0500  Weight: 187 lb 9.6 oz (85.1 kg) 189 lb 11.2 oz (86 kg) 188 lb 12.8 oz (85.6 kg)    Telemetry    NA - Personally Reviewed  ECG    NSR, rate 59, axis WNL, inferior and anterolateral T wave inversion new since previous.  - Personally Reviewed   Labs    Chemistry  Recent Labs Lab 08/09/17 0955 08/10/17 0054 08/11/17 0559  NA 139 143 139  K 4.7 3.1* 3.9  CL 103 102 102  CO2 21* 31 27  GLUCOSE 104* 88 140*  BUN 81* 48* 22*  CREATININE 2.45* 1.34* 0.79  CALCIUM 9.9 9.5 10.0  PROT 6.9 5.9* 6.3*  ALBUMIN 3.5 3.0* 2.9*    AST 482* 904* 922*  ALT 405* 793* 1,486*  ALKPHOS 149* 135* 130*  BILITOT 2.7* 2.1* 1.5*  GFRNONAA 21* 44* >60  GFRAA 24* 51* >60  ANIONGAP 15 10 10      Hematology  Recent Labs Lab 08/09/17 0955 08/10/17 0054 08/11/17 0559  WBC 16.3* 11.8* 8.6  RBC 5.18* 4.75 4.99  HGB 11.3* 10.0* 10.7*  HCT 37.0 33.8* 35.5*  MCV 71.4* 71.2* 71.1*  MCH 21.8* 21.1* 21.4*  MCHC 30.5 29.6* 30.1  RDW 20.2* 19.8* 20.8*  PLT 163 116* 118*    Cardiac Enzymes  Recent Labs Lab 08/09/17 1259 08/09/17 1912 08/10/17 0054  TROPONINI 1.13* 1.62* 1.67*     Recent Labs Lab 08/09/17 1007 08/09/17 1318  TROPIPOC 0.78* 1.11*     BNP  Recent Labs Lab 08/09/17 0956  BNP 2,139.4*     DDimer No results for input(s): DDIMER in the last 168 hours.   Radiology  Dg Chest 2 View  Result Date: 08/11/2017 CLINICAL DATA:  Shortness of breath today EXAM: CHEST  2 VIEW COMPARISON:  08/10/2017 FINDINGS: Cardiomegaly hilar fullness is again noted bilaterally. When compared to prior chest CT, no adenopathy was present and therefore this is felt to be vascular. No overt edema. Linear areas of scarring or atelectasis in the lingula. No effusions. No acute bony abnormality. IMPRESSION: Cardiomegaly.  Left lower lung atelectasis or scarring. Stable bilateral hilar prominence, likely vascular. Electronically Signed   By: Rolm Baptise M.D.   On: 08/11/2017 09:05   Dg Chest 2 View  Result Date: 08/10/2017 CLINICAL DATA:  Shortness of breath. History of stepped cysts and CHF. Also COPD current smoker. EXAM: CHEST  2 VIEW COMPARISON:  Portable chest x-ray of August 09, 2017 FINDINGS: The lungs are adequately inflated. There is bilateral hilar prominence which is stable. The pulmonary vascularity appears engorged with some cephalization. The interstitial markings are less prominent today. There remain increased densities lateral to the left heart border. IMPRESSION: Slight interval improvement in the  appearance of the chest with decreased interstitial edema. Persistent bilateral hilar prominence may reflect the congested pulmonary vascularity though underlying lymphadenopathy is not excluded. Electronically Signed   By: David  Martinique M.D.   On: 08/10/2017 07:50   Dg Ankle 2 Views Left  Result Date: 08/10/2017 CLINICAL DATA:  56 year old who fell 5 days ago. Persistent left ankle pain. Initial encounter. EXAM: LEFT ANKLE - 2 VIEW COMPARISON:  None. FINDINGS: Lateral soft tissue swelling. Comminuted mildly displaced oblique fracture involving the distal fibula. Tiny avulsion fracture arising from the medial malleolus. Slight lateral subluxation of the talus relative to the tibial plafond. IMPRESSION: 1. Comminuted mildly displaced oblique fracture involving the distal fibula. 2. Tiny avulsion fracture arising from the medial malleolus. 3. Slight lateral subluxation of the talus relative to the tibia. Electronically Signed   By: Evangeline Dakin M.D.   On: 08/10/2017 18:46   Dg Knee Complete 4 Views Left  Result Date: 08/11/2017 CLINICAL DATA:  LEFT ankle fracture. EXAM: LEFT KNEE - COMPLETE 4+ VIEW COMPARISON:  None. FINDINGS: No evidence of fracture, dislocation, or joint effusion. No evidence of arthropathy or other focal bone abnormality. Linear densities distal femur suggesting growth lines. Soft tissues are unremarkable. IMPRESSION: Negative. Electronically Signed   By: Elon Alas M.D.   On: 08/11/2017 16:52    Cardiac Studies   ECHO PENDING   Patient Profile     56 y.o. female with a hx of chronic diastolic CHF, moderate pulmonary HTN, fibromyalgia, oxygen dependent COPD, HTN, and HLD who is being seen for the evaluation of elevated troponin, volume overload at the request of Dr. Marily Memos.   Assessment & Plan    ELEVATED TROPONIN:   Elevated but trend is flat.  I have asked the echo department to make sure that they do this study today.  The EKG was done as a routine for follow up  was changed with T wave inversion in the inferior and anterolateral leads.  She is off the floor for ankle surgery.  We will follow up the echo and see tomorrow.  Please call today if there are any cardiac questions.   Signed, Minus Breeding, MD  08/12/2017, 5:40 AM

## 2017-08-12 NOTE — Op Note (Signed)
OPERATIVE REPORT   08/12/2017  8:47 AM  PATIENT:  Teresa Roberts   SURGEON:  Emalee Knies, Horald Pollen, MD  ASSISTANT:  Ky Barban, RNFA.   PREOPERATIVE DIAGNOSIS:  Left bimalleolar equivalent ankle fracture.  POSTOPERATIVE DIAGNOSIS:  1. Left bimalleolar equivalent ankle fracture. 2. Syndesmotic injury.  PROCEDURE:  1. Open reduction internal fixation left lateral malleolus. 2.  Open treatment of syndesmosis.  ANESTHESIA:   General.  ANTIBIOTICS:  2 g Ancef.  ESTIMATED BLOOD LOSS: Minimal.  IMPLANTS:  Biomet ALPS composite 7 hole plate with 3.5 mm screws. Biomet zip tight syndesmotic implant 1.  SPECIMENS:  None.  COMPLICATIONS:  None.  DISPOSITION:  Stable to PACU.  SURGICAL INDICATIONS:  [name] is a 56 y.o. female with a diagnosis of displaced left distal fibula fracture, bimalleolar equivalent. Patient was ill recently with pneumonia, and she sustained several falls. She has been in the hospital for several days with altered mental status, and she finally improved on antibiotics. She began complaining of left ankle pain. X-rays were obtained, showing the above mentioned fracture.  Orthopedic consultation was obtained.  The risks, benefits, and alternatives were discussed with the patient preoperatively including but not limited to the risks of infection, bleeding, nerve / blood vessel injury, malunion, nonunion, cardiopulmonary complications, the need for repeat surgery, among others, and the patient was willing to proceed.  PROCEDURE IN DETAIL: I identified the patient the holding area using 2 identifiers. The surgical site was marked. She was taken to the operating room, and she was transferred to the operating room table. Gen. anesthesia was induced. All bony prominences were well padded. A nonsterile tourniquet was applied. She was positioned on the bone foam positioner.  The left lower extremity was prepped and draped in normal sterile surgical fashion. Timeout  was called verifying side and site of surgery. She did receive preoperative antibiotics within 60 minutes of beginning the procedure. Gravity was used to exsanguinate the lower extremity, and the tourniquet was elevated to 250 mmHg. I made a longitudinal incision over the distal fibula. Distally, I carried the incision sharply down to bone. Proximally, I performed careful blunt dissection order to protect the superficial peroneal nerve. Periosteum was elevated. I identified the fracture. Fracture was cleaned with curet and a rongeur. I reduced the fracture and held the reduction with a clamp. Using standard AO technique, I placed a single lag screw to hold the fracture. I selected a 7-hole plate. I bent the plate. The plate was held in place provisionally. I placed a total of 3 bicortical screws proximally. Distally, I placed a single compression screw. I then placed 2 locking screws, taking care to not penetrate the joint. I then obtained AP, mortise, and lateral views. I performed a live external rotation stress test. She did have mild medial clear space widening. Therefore, I prepared for a zip tight implant. I placed the guidepin across all 4 cortices, parallel to the joint. I then drilled over the pin. The implant was placed, tightened, and the sutures were clipped. I then obtained AP, mortise, lateral, and live external rotation stress views. The mortise was stable. Reduction was anatomic. Hardware placement was appropriate.  The tourniquet was let down. Meticulous hemostasis was achieved with electrocautery. The wound was copiously irrigated with saline. The wound was closed in layers with 2-0 Vicryl for the fascia, 3-0 Vicryl for the deep dermal tissue, staples for skin. Sterile dressing followed by a posterior splint with a U was applied. Patient was then awakened from  anesthesia and taken to the PACU in stable condition. Sponge, needle, and instrument counts were correct the case 2. There were no known  complications.  POSTOPERATIVE PLAN:  Postoperatively, we will readmit the patient to the hospitalist. She'll be nonweightbearing left lower extremity. Keep the splint clean and dry. Do not remove the splint. We'll place her on Lovenox for DVT prophylaxis in-house, and she may be discharged on aspirin. I'll need to see her in the office 2 weeks after discharge for suture removal.

## 2017-08-12 NOTE — Discharge Instructions (Signed)
1. Nonweightbearing left lower extremity. 2. Keep the splint clean and dry. Do not remove the splint. 3. Elevate your toes above nose in order to control pain and swelling.

## 2017-08-12 NOTE — Progress Notes (Signed)
Patient ID: Teresa Roberts, female   DOB: April 20, 1961, 56 y.o.   MRN: [id number]  PROGRESS NOTE    Alawna Graybeal  AJO:[id number] DOB: 19-Nov-1960 DOA: 08/09/2017 PCP: Jonathon Jordan, MD   Brief Narrative:  56 year old female with history of hyperlipidemia, hypertension, PTSD, fibromyalgia, COPD, chronic hypoxic respiratory failure on home oxygen presented on 08/09/2017 with confusion. She was found to have probable pneumonia versus UTI along with acute kidney injury, abnormal LFTs and positive troponin. Cardiology was consulted. She was started on IV fluids and broad spectrum antibiotics.   Assessment & Plan:   Active Problems:   Fibromyalgia   Migraine   COPD (chronic obstructive pulmonary disease) (HCC)   Tobacco abuse   Sepsis ([place])   Acute on chronic respiratory failure with hypoxia (HCC)   Acute renal failure (ARF) (HCC)   Transaminitis   Acute on chronic diastolic CHF (congestive heart failure) (HCC)   Elevated troponin   Sepsis secondary to UTI ([place])   Acute metabolic encephalopathy   Bimalleolar fracture of left ankle   Sepsis: - likely multifactorial including secondary to UTI and possible CAP.  - Blood cultures negative so far. Urine culture probably contaminant. Repeat urine pending - Blood pressure improved  Left ankle fracture - Probably secondary to falls at home - Status post ORIF by orthopedics today  UTI: - Continue Levaquin. Follow repeat urine culture  Acute on chronic hypoxic respiratory failure:  - Respiratory status improving. Continue nasal cannula oxygen supplementation.  - Echo still pending  COPD with probable exacerbation - Continue Breo and Incruse ellipta. Change Solu-Medrol to 40 mg IV every daily and switch to oral prednisone tomorrow - Respiratory status stable - Outpatient follow-up with pulmonary  Leukocytosis - Probably secondary. Repeat a.m. labs  Tobacco abuse ongoing - Counseled about tobacco cessation  Chronic diastolic heart  failure - Blood pressure improved. Cardiology following. Monitor strict input and output and daily weights - Echo pending. Restart Lasix if okay with cardiology  Elevated Troponin: likely secondary demand and hypoperfusion from sepsis. Recent heart cath (01/2017) by Dr. Claiborne Billings showing non-obstructive CAD.  - Cardiology following.  Acute Renal failure:  -Creatinine improved.   Acute Transaminitis probably from shock liver:  - LFTs improving. Repeat a.m. labs.Ultrasound does not show gallstones or cholecystitis or biliary dilatation  HTN:  -Blood pressure stable. Monitor off antihypertensives  Acute metabolic encephalopathy leading to altered mental status:  -likely from sepsis, hypoxemia, and mild uremia.  -Improved   Depression: - Hold Cymbalta because of abnormal LFTs. Monitor  Chronic MSK/Neuropathic pain: - Stable. Continue Neurontin and Robaxin   HA: h/o migraines - hold maxalt for now   DVT prophylaxis: Heparin Code Status:  Full Family Communication: Discussed with husband and mother at bedside Disposition Plan: Discharge in 1-2 days  Consultants: Cardiology ; orthopedics  Procedures: None  Antimicrobials: Vancomycin and Zosyn from 08/09/2017-08/11/2017 Levaquin from 08/11/2017   Subjective: Patient seen and examined at bedside. She just came back from her left leg surgery. She complains of left lower extremity pain but no current chest pain, nausea or vomiting   Objective: Vitals:   08/12/17 0906 08/12/17 0915 08/12/17 0930 08/12/17 1018  BP: 130/86 112/77 129/84 129/87  Pulse: 72   74  Resp: 14   18  Temp: 98.4 F (36.9 C)  97.7 F (36.5 C)   TempSrc:      SpO2: 100%   100%  Weight:      Height:        Intake/Output Summary (Last  24 hours) at 08/12/17 1300 Last data filed at 08/12/17 0908  Gross per 24 hour  Intake              500 ml  Output              460 ml  Net               40 ml   Filed Weights   08/10/17 0511 08/11/17 0428  08/12/17 0500  Weight: 85.1 kg (187 lb 9.6 oz) 86 kg (189 lb 11.2 oz) 85.6 kg (188 lb 12.8 oz)    Examination:  General exam: Alert and awake and calm Respiratory system: Bilateral decreased breath sound at bases with scattered crackles Cardiovascular system: S1 & S2 heard, rate controlled.  Gastrointestinal system: Abdomen is nondistended, soft and nontender. Normal bowel sounds heard. Extremities: No cyanosis, clubbing; left lower extremity dressing present    Data Reviewed: I have personally reviewed following labs and imaging studies  CBC:  Recent Labs Lab 08/09/17 0955 08/10/17 0054 08/11/17 0559 08/12/17 0432  WBC 16.3* 11.8* 8.6 13.4*  NEUTROABS 13.4*  --  8.0* 12.0*  HGB 11.3* 10.0* 10.7* 10.7*  HCT 37.0 33.8* 35.5* 35.7*  MCV 71.4* 71.2* 71.1* 71.4*  PLT 163 116* 118* 010*   Basic Metabolic Panel:  Recent Labs Lab 08/09/17 0955 08/09/17 1259 08/10/17 0054 08/11/17 0559 08/12/17 0432  NA 139  --  143 139 139  K 4.7  --  3.1* 3.9 4.0  CL 103  --  102 102 102  CO2 21*  --  31 27 30   GLUCOSE 104*  --  88 140* 157*  BUN 81*  --  48* 22* 21*  CREATININE 2.45*  --  1.34* 0.79 0.84  CALCIUM 9.9  --  9.5 10.0 10.2  MG  --  2.5*  --  2.0 1.9  PHOS  --  3.3  --   --   --    GFR: Estimated Creatinine Clearance: 83.4 mL/min (by C-G formula based on SCr of 0.84 mg/dL). Liver Function Tests:  Recent Labs Lab 08/09/17 0955 08/10/17 0054 08/11/17 0559 08/12/17 0432  AST 482* 904* 922* 265*  ALT 405* 793* 1,486* 1,178*  ALKPHOS 149* 135* 130* 124  BILITOT 2.7* 2.1* 1.5* 1.0  PROT 6.9 5.9* 6.3* 6.1*  ALBUMIN 3.5 3.0* 2.9* 2.9*    Recent Labs Lab 08/09/17 0955  LIPASE 57*    Recent Labs Lab 08/09/17 1205 08/09/17 1259  AMMONIA 54* 48*   Coagulation Profile:  Recent Labs Lab 08/09/17 0955 08/12/17 0432  INR 1.73 1.36   Cardiac Enzymes:  Recent Labs Lab 08/09/17 0955 08/09/17 1259 08/09/17 1912 08/10/17 0054  CKTOTAL 183  --   --   --    TROPONINI  --  1.13* 1.62* 1.67*   BNP (last 3 results) No results for input(s): PROBNP in the last 8760 hours. HbA1C: No results for input(s): HGBA1C in the last 72 hours. CBG:  Recent Labs Lab 08/09/17 0946  GLUCAP 106*   Lipid Profile: No results for input(s): CHOL, HDL, LDLCALC, TRIG, CHOLHDL, LDLDIRECT in the last 72 hours. Thyroid Function Tests: No results for input(s): TSH, T4TOTAL, FREET4, T3FREE, THYROIDAB in the last 72 hours. Anemia Panel: No results for input(s): VITAMINB12, FOLATE, FERRITIN, TIBC, IRON, RETICCTPCT in the last 72 hours. Sepsis Labs:  Recent Labs Lab 08/09/17 1259 08/09/17 1320 08/09/17 1641 08/10/17 0054  PROCALCITON 0.52  --   --   --  LATICACIDVEN 2.8* 2.89* 2.2* 0.8    Recent Results (from the past 240 hour(s))  Blood culture (routine x 2)     Status: None (Preliminary result)   Collection Time: 08/09/17 10:00 AM  Result Value Ref Range Status   Specimen Description BLOOD RIGHT ANTECUBITAL  Final   Special Requests   Final    BOTTLES DRAWN AEROBIC AND ANAEROBIC Blood Culture adequate volume   Culture NO GROWTH 3 DAYS  Final   Report Status PENDING  Incomplete  Blood culture (routine x 2)     Status: None (Preliminary result)   Collection Time: 08/09/17 10:15 AM  Result Value Ref Range Status   Specimen Description BLOOD RIGHT FOREARM  Final   Special Requests   Final    BOTTLES DRAWN AEROBIC AND ANAEROBIC Blood Culture adequate volume   Culture NO GROWTH 3 DAYS  Final   Report Status PENDING  Incomplete  Urine culture     Status: Abnormal   Collection Time: 08/09/17 10:39 AM  Result Value Ref Range Status   Specimen Description URINE, RANDOM  Final   Special Requests NONE  Final   Culture MULTIPLE SPECIES PRESENT, SUGGEST RECOLLECTION (A)  Final   Report Status 08/10/2017 FINAL  Final  MRSA PCR Screening     Status: None   Collection Time: 08/09/17 10:51 PM  Result Value Ref Range Status   MRSA by PCR NEGATIVE NEGATIVE  Final    Comment:        The GeneXpert MRSA Assay (FDA approved for NASAL specimens only), is one component of a comprehensive MRSA colonization surveillance program. It is not intended to diagnose MRSA infection nor to guide or monitor treatment for MRSA infections.          Radiology Studies: Dg Chest 2 View  Result Date: 08/11/2017 CLINICAL DATA:  Shortness of breath today EXAM: CHEST  2 VIEW COMPARISON:  08/10/2017 FINDINGS: Cardiomegaly hilar fullness is again noted bilaterally. When compared to prior chest CT, no adenopathy was present and therefore this is felt to be vascular. No overt edema. Linear areas of scarring or atelectasis in the lingula. No effusions. No acute bony abnormality. IMPRESSION: Cardiomegaly.  Left lower lung atelectasis or scarring. Stable bilateral hilar prominence, likely vascular. Electronically Signed   By: Rolm Baptise M.D.   On: 08/11/2017 09:05   Dg Ankle 2 Views Left  Result Date: 08/10/2017 CLINICAL DATA:  56 year old who fell 5 days ago. Persistent left ankle pain. Initial encounter. EXAM: LEFT ANKLE - 2 VIEW COMPARISON:  None. FINDINGS: Lateral soft tissue swelling. Comminuted mildly displaced oblique fracture involving the distal fibula. Tiny avulsion fracture arising from the medial malleolus. Slight lateral subluxation of the talus relative to the tibial plafond. IMPRESSION: 1. Comminuted mildly displaced oblique fracture involving the distal fibula. 2. Tiny avulsion fracture arising from the medial malleolus. 3. Slight lateral subluxation of the talus relative to the tibia. Electronically Signed   By: Evangeline Dakin M.D.   On: 08/10/2017 18:46   Dg Ankle Complete Left  Result Date: 08/12/2017 CLINICAL DATA:  ORIF left ankle. EXAM: DG C-ARM 61-120 MIN; LEFT ANKLE COMPLETE - 3+ VIEW COMPARISON:  No recent prior. FINDINGS: Plate and screw fixation of the distal fibula noted. Metallic density noted adjacent to the distal fibula this appears to be  at a site of prior screw fixation. IMPRESSION: Postsurgical changes left ankle as above. Electronically Signed   By: [place]   On: 08/12/2017 08:59   Dg Knee Complete  4 Views Left  Result Date: 08/11/2017 CLINICAL DATA:  LEFT ankle fracture. EXAM: LEFT KNEE - COMPLETE 4+ VIEW COMPARISON:  None. FINDINGS: No evidence of fracture, dislocation, or joint effusion. No evidence of arthropathy or other focal bone abnormality. Linear densities distal femur suggesting growth lines. Soft tissues are unremarkable. IMPRESSION: Negative. Electronically Signed   By: Elon Alas M.D.   On: 08/11/2017 16:52   Dg Ankle Left Port  Result Date: 08/12/2017 CLINICAL DATA:  Status post ORIF left ankle EXAM: PORTABLE LEFT ANKLE - 2 VIEW COMPARISON:  Intraoperative films from earlier in the same day. FINDINGS: Fixation sideplate is noted along the distal fibula. Multiple fixation screws are noted. Additionally a fixation device traversing the distal tibia is noted. Fracture fragments are in anatomic alignment. Overlying casting material limits fine bony detail. IMPRESSION: Status post ORIF of left fibular fracture. Electronically Signed   By: Inez Catalina M.D.   On: 08/12/2017 09:40   Dg C-arm 1-60 Min  Result Date: 08/12/2017 CLINICAL DATA:  ORIF left ankle. EXAM: DG C-ARM 61-120 MIN; LEFT ANKLE COMPLETE - 3+ VIEW COMPARISON:  No recent prior. FINDINGS: Plate and screw fixation of the distal fibula noted. Metallic density noted adjacent to the distal fibula this appears to be at a site of prior screw fixation. IMPRESSION: Postsurgical changes left ankle as above. Electronically Signed   By: Marcello Moores  Register   On: 08/12/2017 08:59        Scheduled Meds: . ARIPiprazole  5 mg Oral Daily  . busPIRone  15 mg Oral BID  . [START ON 08/13/2017] enoxaparin (LOVENOX) injection  40 mg Subcutaneous Q24H  . fluticasone furoate-vilanterol  1 puff Inhalation Daily   And  . umeclidinium bromide  1 puff Inhalation  Daily  . gabapentin  800 mg Oral BID  . levofloxacin  500 mg Oral Daily  . mouth rinse  15 mL Mouth Rinse BID  . methylPREDNISolone (SOLU-MEDROL) injection  40 mg Intravenous Q12H  . pantoprazole  40 mg Oral Daily   Continuous Infusions: .  ceFAZolin (ANCEF) IV       LOS: 3 days        Aline August, MD [facility] Pager [phone]  If 7PM-7AM, please contact night-coverage [email or web address] Password [email or web address] 08/12/2017, 1:00 PM

## 2017-08-12 NOTE — Anesthesia Procedure Notes (Signed)
Anesthesia Regional Block: Popliteal block   Pre-Anesthetic Checklist: ,, timeout performed, Correct Patient, Correct Site, Correct Laterality, Correct Procedure, Correct Position, site marked, Risks and benefits discussed,  Surgical consent,  Pre-op evaluation,  At surgeon's request and post-op pain management  Laterality: Left  Prep: chloraprep       Needles:  Injection technique: Single-shot  Needle Type: Echogenic Needle     Needle Length: 9cm  Needle Gauge: 21     Additional Needles:   Narrative:  Start time: 08/12/2017 7:20 AM End time: 08/12/2017 7:25 AM Injection made incrementally with aspirations every 5 mL.  Performed by: Personally  Anesthesiologist: Renold Don E  Additional Notes: No pain on injection. No increased resistance to injection. Injection made in 5cc increments. Good needle visualization. Patient tolerated the procedure well.

## 2017-08-12 NOTE — Progress Notes (Signed)
  Echocardiogram 2D Echocardiogram has been performed.  Jannett Celestine 08/12/2017, 2:37 PM

## 2017-08-12 NOTE — Interval H&P Note (Signed)
History and Physical Interval Note:  08/12/2017 7:27 AM  Teresa Roberts  has presented today for surgery, with the diagnosis of left ankle fracture  The various methods of treatment have been discussed with the patient and family. After consideration of risks, benefits and other options for treatment, the patient has consented to  Procedure(s): OPEN REDUCTION INTERNAL FIXATION (ORIF) ANKLE FRACTURE (Left) as a surgical intervention .  The patient's history has been reviewed, patient examined, no change in status, stable for surgery.  I have reviewed the patient's chart and labs.  Questions were answered to the patient's satisfaction.     Desiray Orchard, Horald Pollen

## 2017-08-13 LAB — CBC WITH DIFFERENTIAL/PLATELET
BASOS PCT: 0 %
Basophils Absolute: 0 10*3/uL (ref 0.0–0.1)
EOS ABS: 0 10*3/uL (ref 0.0–0.7)
EOS PCT: 0 %
HCT: 35.3 % — ABNORMAL LOW (ref 36.0–46.0)
HEMOGLOBIN: 10.2 g/dL — AB (ref 12.0–15.0)
LYMPHS PCT: 5 %
Lymphs Abs: 0.7 10*3/uL (ref 0.7–4.0)
MCH: 20.8 pg — AB (ref 26.0–34.0)
MCHC: 28.9 g/dL — ABNORMAL LOW (ref 30.0–36.0)
MCV: 72 fL — ABNORMAL LOW (ref 78.0–100.0)
MONOS PCT: 8 %
Monocytes Absolute: 1.2 10*3/uL — ABNORMAL HIGH (ref 0.1–1.0)
NEUTROS PCT: 87 %
Neutro Abs: 13 10*3/uL — ABNORMAL HIGH (ref 1.7–7.7)
Platelets: 113 10*3/uL — ABNORMAL LOW (ref 150–400)
RBC: 4.9 MIL/uL (ref 3.87–5.11)
RDW: 20.6 % — ABNORMAL HIGH (ref 11.5–15.5)
WBC: 14.9 10*3/uL — ABNORMAL HIGH (ref 4.0–10.5)

## 2017-08-13 LAB — COMPREHENSIVE METABOLIC PANEL
ALBUMIN: 2.7 g/dL — AB (ref 3.5–5.0)
ALK PHOS: 109 U/L (ref 38–126)
ALT: 603 U/L — AB (ref 14–54)
AST: 68 U/L — ABNORMAL HIGH (ref 15–41)
Anion gap: 8 (ref 5–15)
BUN: 18 mg/dL (ref 6–20)
CALCIUM: 9.7 mg/dL (ref 8.9–10.3)
CO2: 26 mmol/L (ref 22–32)
CREATININE: 0.83 mg/dL (ref 0.44–1.00)
Chloride: 103 mmol/L (ref 101–111)
GFR calc non Af Amer: >60 mL/min (ref >60)
GLUCOSE: 194 mg/dL — AB (ref 65–99)
Potassium: 4 mmol/L (ref 3.5–5.1)
SODIUM: 137 mmol/L (ref 135–145)
Total Bilirubin: 1 mg/dL (ref 0.3–1.2)
Total Protein: 5.4 g/dL — ABNORMAL LOW (ref 6.5–8.1)

## 2017-08-13 LAB — MAGNESIUM: Magnesium: 1.8 mg/dL (ref 1.7–2.4)

## 2017-08-13 MED ORDER — METHOCARBAMOL 500 MG PO TABS: 500.0000 mg | ORAL_TABLET | Freq: Three times a day (TID) | ORAL | Status: AC | PRN

## 2017-08-13 MED ORDER — LEVOFLOXACIN 500 MG PO TABS: 500.0000 mg | ORAL_TABLET | Freq: Every day | ORAL | 0 refills | Status: AC

## 2017-08-13 MED ORDER — PREDNISONE 20 MG PO TABS: 40.0000 mg | ORAL_TABLET | Freq: Every day | ORAL | 0 refills | Status: AC

## 2017-08-13 MED ORDER — ASPIRIN 325 MG PO TBEC: 325.0000 mg | DELAYED_RELEASE_TABLET | Freq: Two times a day (BID) | ORAL | 0 refills | Status: DC

## 2017-08-13 MED ORDER — HYDROCODONE-ACETAMINOPHEN 5-325 MG PO TABS: 1.0000 | ORAL_TABLET | Freq: Four times a day (QID) | ORAL | 0 refills | Status: DC | PRN

## 2017-08-13 NOTE — Progress Notes (Signed)
Subjective:  No complaints of shortness of breath or chest pain  Objective:  Vital Signs in the last 24 hours: BP 126/78 (BP Location: Left Arm)   Pulse 61   Temp 97.8 F (36.6 C) (Oral)   Resp 18   Ht 5\' 6"  (1.676 m)   Wt 85.6 kg (188 lb 12.8 oz)   LMP 08/19/2012 Comment: reviewed by Baxter Flattery Dingus RT-T  SpO2 100%   BMI 30.47 kg/m   Physical Exam: Middle-aged black female in no acute distress Lungs:  Clear  Cardiac:  Regular rhythm, normal S1 and S2, no S3, 1 to 2/6 systolic murmur Abdomen:  Soft, nontender, no masses Extremities:  No edema present  Intake/Output from previous day: 09/28 0701 - 09/29 0700 In: 860 [P.O.:360; I.V.:500] Out: 710 [Urine:700; Blood:10] Weight Filed Weights   08/10/17 0511 08/11/17 0428 08/12/17 0500  Weight: 85.1 kg (187 lb 9.6 oz) 86 kg (189 lb 11.2 oz) 85.6 kg (188 lb 12.8 oz)    Lab Results: Basic Metabolic Panel:  Recent Labs  08/12/17 0432 08/13/17 0526  NA 139 137  K 4.0 4.0  CL 102 103  CO2 30 26  GLUCOSE 157* 194*  BUN 21* 18  CREATININE 0.84 0.83    CBC:  Recent Labs  08/12/17 0432 08/13/17 0526  WBC 13.4* 14.9*  NEUTROABS 12.0* 13.0*  HGB 10.7* 10.2*  HCT 35.7* 35.3*  MCV 71.4* 72.0*  PLT 122* 113*    BNP    Component Value Date/Time   BNP 2,139.4 (H) 08/09/2017 0956    Telemetry: personally reviewed sinus rhythm with PVCs  Assessment/Plan:  1.  Acute on chronicChronic diastolic heart failure 2.  Elevated troponin likely due to demand ischemia no further workup necessary at this point 3.  Acute on chronic renal insufficiency 4.  Thrombocytopenia 5.  Transaminitis 6.  Right heart failure with pulmonary hypertension-may be component of cor pulmonale or due to chronic diastolic heart failure  Recommendations  She looks volume neutral at this point in time.  No additional workup of the troponins as necessary.  Continue oxygen.  Echo showed preserved LV function suggesting diastolic component.   Significant right heart dysfunction.   Teresa Hough  MD [facility]  08/13/2017, 9:31 AM

## 2017-08-13 NOTE — Evaluation (Signed)
Occupational Therapy Evaluation Patient Details Name: Teresa Roberts MRN: [id number] DOB: 1961/01/21 Today's Date: 08/13/2017    History of Present Illness 56 y.o. female with medical history significant of hyperlipidemia, hypertension, PTSD, fibromyalgia, COPD. She presented to the ED with c/o confusion. Pt admitted for UTI and sepsis.  She had c/o L ankle pain. Family reports multi falls at home. Xray revealed L ankle fx. Pt underwent ORIF L ankle.    Clinical Impression   Pt. And husband were educated on NWB precautions. Pt. Is having difficulty with hopping and maintaining NWB level. Discussed with pt. And husband that they will need 3-1 commode and tub transfer bench at d/c and are in agreement. Pt. Is to d/c today with family to assist at home as needed.     Follow Up Recommendations  Other (comment) [facility] nurse can assess for HHOT needs. )    Equipment Recommendations  3 in 1 bedside commode;Tub/shower bench    Recommendations for Other Services       Precautions / Restrictions Precautions Precautions: Fall Restrictions Weight Bearing Restrictions: Yes LLE Weight Bearing: Non weight bearing Other Position/Activity Restrictions: NWB ON L LE      Mobility Bed Mobility Overal bed mobility: Needs Assistance Bed Mobility: Supine to Sit     Supine to sit: Min guard        Transfers Overall transfer level: Needs assistance Equipment used: Rolling walker (2 wheeled) Transfers: Sit to/from [facility] Sit to Stand: Min assist Stand pivot transfers: Min assist       General transfer comment: verbal cues for hand placement    Balance                                           ADL either performed or assessed with clinical judgement   ADL Overall ADL's : Needs assistance/impaired Eating/Feeding: Independent   Grooming: Set up;Sitting   Upper Body Bathing: Set up;Sitting   Lower Body Bathing: Minimal assistance;Moderate  assistance;Sit to/from stand   Upper Body Dressing : Set up;Sitting   Lower Body Dressing: Moderate assistance;Maximal assistance;Sit to/from stand   Toilet Transfer: Minimal assistance;Stand-pivot   Toileting- Clothing Manipulation and Hygiene: Moderate assistance;Sit to/from stand       Functional mobility during ADLs: Minimal assistance (Pt. is having difficulty adherring to wb precautions. ) General ADL Comments: Pt. and husband were educated on nwb status and need for dme.  (Pt. was educated on wearing loose clothing to be able to don)     Vision Baseline Vision/History: Wears glasses Wears Glasses: At all times Patient Visual Report: No change from baseline       Perception     Praxis      Pertinent Vitals/Pain Pain Assessment: 0-10 Pain Score: 6  Pain Location: LLE Pain Descriptors / Indicators: Aching Pain Intervention(s): Monitored during session;Limited activity within patient's tolerance;Repositioned     Hand Dominance Right   Extremity/Trunk Assessment Upper Extremity Assessment Upper Extremity Assessment: RUE deficits/detail;LUE deficits/detail RUE:  (R SHLD FLEX 70 DEGREES SECONDARY TO FIBROMYALGIA) LUE Deficits / Details:  (L SHLD FLEX 70 DEGREES SECONDARY TO FIBROMYALGIA)           Communication Communication Communication: No difficulties   Cognition Arousal/Alertness: Awake/alert Behavior During Therapy: WFL for tasks assessed/performed Overall Cognitive Status: Within Functional Limits for tasks assessed  General Comments       Exercises     Shoulder Instructions      Home Living Family/patient expects to be discharged to:: Private residence Living Arrangements: Spouse/significant other Available Help at Discharge: Available 24 hours/day Type of Home: House Home Access: Level entry     Home Layout: One level     Bathroom Shower/Tub: Teacher, early years/pre:  Standard     Home Equipment: Cane - single point          Prior Functioning/Environment Level of Independence: Independent with assistive device(s)                 OT Problem List:        OT Treatment/Interventions:      OT Goals(Current goals can be found in the care plan section) Acute Rehab OT Goals Patient Stated Goal: home today OT Goal Formulation: With patient  OT Frequency:     Barriers to D/C:            Co-evaluation              AM-PAC PT "6 Clicks" Daily Activity     Outcome Measure Help from another person eating meals?: None Help from another person taking care of personal grooming?: A Little Help from another person toileting, which includes using toliet, bedpan, or urinal?: A Lot Help from another person bathing (including washing, rinsing, drying)?: A Lot Help from another person to put on and taking off regular upper body clothing?: A Little Help from another person to put on and taking off regular lower body clothing?: A Lot 6 Click Score: 16   End of Session Equipment Utilized During Treatment: Gait belt;Rolling walker;Oxygen Nurse Communication:  (discussed equipment needs)  Activity Tolerance:   Patient left: in chair;with call bell/phone within reach;with family/visitor present  OT Visit Diagnosis: Repeated falls (R29.6)                Time: 6659-9357 OT Time Calculation (min): 59 min Charges:  OT General Charges $OT Visit: 1 Visit OT Evaluation $OT Eval Moderate Complexity: 1 Mod OT Treatments $Self Care/Home Management : 23-37 mins G-Codes:    6 clicks  [employer] 08/13/2017, 1:47 PM

## 2017-08-13 NOTE — Evaluation (Signed)
Physical Therapy Evaluation Patient Details Name: Teresa Roberts MRN: [id number] DOB: [birth year] Today's Date: 08/13/2017   History of Present Illness  56 y.o. female with medical history significant of hyperlipidemia, hypertension, PTSD, fibromyalgia, COPD. She presented to the ED with c/o confusion. Pt admitted for UTI and sepsis.  She had c/o L ankle pain. Family reports multi falls at home. Xray revealed L ankle fx. Pt underwent ORIF L ankle.   Clinical Impression  PT eval complete. Pt required min guard assist bed mobility, min assist transfers, and min assist ambulation 10 feet with RW. Pt will have needed level of assist from family. Recommend no follow up PT services at d/c. Pt is unable to maintain NWB LLE and, therefore, mobility needs to be minimized. Recommend initiation of HHPT when WB status increased. Pt to d/c home today. PT signing off.    Follow Up Recommendations No PT follow up;Supervision for mobility/OOB    Equipment Recommendations  Wheelchair cushion (measurements PT);Wheelchair (measurements PT);Rolling walker with 5" wheels    Recommendations for Other Services       Precautions / Restrictions Precautions Precautions: Fall Restrictions Weight Bearing Restrictions: Yes LLE Weight Bearing: Non weight bearing Other Position/Activity Restrictions: NWB ON L LE      Mobility  Bed Mobility Overal bed mobility: Needs Assistance Bed Mobility: Supine to Sit     Supine to sit: Min guard        Transfers Overall transfer level: Needs assistance Equipment used: Rolling walker (2 wheeled) Transfers: Sit to/from [facility] Sit to Stand: Min assist Stand pivot transfers: Min assist       General transfer comment: verbal cues for hand placement  Ambulation/Gait Ambulation/Gait assistance: Min assist Ambulation Distance (Feet): 10 Feet Assistive device: Rolling walker (2 wheeled) Gait Pattern/deviations: Step-to pattern;Antalgic Gait  velocity: decreased   General Gait Details: Pt unable to maintain NWB LLE even with continuous cueing.   Stairs            Wheelchair Mobility    Modified Rankin (Stroke Patients Only)       Balance                                             Pertinent Vitals/Pain Pain Assessment: 0-10 Pain Score: 6  Pain Location: LLE Pain Descriptors / Indicators: Aching Pain Intervention(s): Monitored during session;Limited activity within patient's tolerance;Repositioned    Home Living Family/patient expects to be discharged to:: Private residence Living Arrangements: Spouse/significant other Available Help at Discharge: Available 24 hours/day Type of Home: House Home Access: Level entry     Home Layout: One level Home Equipment: Cane - single point      Prior Function Level of Independence: Independent with assistive device(s)               Hand Dominance   Dominant Hand: Right    Extremity/Trunk Assessment   Upper Extremity Assessment Upper Extremity Assessment: RUE deficits/detail;LUE deficits/detail RUE:  (R SHLD FLEX 70 DEGREES SECONDARY TO FIBROMYALGIA) LUE Deficits / Details:  (L SHLD FLEX 70 DEGREES SECONDARY TO FIBROMYALGIA)            Communication   Communication: No difficulties  Cognition Arousal/Alertness: Awake/alert Behavior During Therapy: WFL for tasks assessed/performed Overall Cognitive Status: Within Functional Limits for tasks assessed  General Comments      Exercises     Assessment/Plan    PT Assessment Patent does not need any further PT services  PT Problem List         PT Treatment Interventions      PT Goals (Current goals can be found in the Care Plan section)  Acute Rehab PT Goals Patient Stated Goal: home today PT Goal Formulation: All assessment and education complete, DC therapy    Frequency     Barriers to discharge         Co-evaluation               AM-PAC PT "6 Clicks" Daily Activity  Outcome Measure Difficulty turning over in bed (including adjusting bedclothes, sheets and blankets)?: A Little Difficulty moving from lying on back to sitting on the side of the bed? : A Little Difficulty sitting down on and standing up from a chair with arms (e.g., wheelchair, bedside commode, etc,.)?: Unable Help needed moving to and from a bed to chair (including a wheelchair)?: A Little Help needed walking in hospital room?: A Little Help needed climbing 3-5 steps with a railing? : A Lot 6 Click Score: 15    End of Session Equipment Utilized During Treatment: Gait belt;Oxygen Activity Tolerance: Patient tolerated treatment well Patient left: in chair;with call bell/phone within reach;with family/visitor present Nurse Communication: Mobility status PT Visit Diagnosis: Difficulty in walking, not elsewhere classified (R26.2);Pain Pain - Right/Left: Left Pain - part of body: Ankle and joints of foot    Time: 1310-1330 PT Time Calculation (min) (ACUTE ONLY): 20 min   Charges:   PT Evaluation $PT Eval Moderate Complexity: 1 Mod     PT G Codes:        Lorrin Goodell, PT  Office # [phone] Pager [phone]   Teresa Roberts 08/13/2017, 1:35 PM

## 2017-08-13 NOTE — Progress Notes (Signed)
   Subjective:  Patient reports pain as mild.  Block is just wearing off.  Denies SOB/CP/N/V.  Objective:   VITALS:   Vitals:   08/12/17 1450 08/12/17 2002 08/13/17 0518 08/13/17 0826  BP: 92/66 110/76 116/78 126/78  Pulse: 78 64 (!) 59 61  Resp:  18 16 18   Temp: 97.8 F (36.6 C) 98.2 F (36.8 C) 97.8 F (36.6 C)   TempSrc: Oral Oral Oral   SpO2: 98% 99% 94% 100%  Weight:      Height:        Neurologically intact Neurovascular intact Sensation intact distally Incision: splint c/d/i Compartment soft Able to wiggle toes, no pain  Lab Results  Component Value Date   WBC 14.9 (H) 08/13/2017   HGB 10.2 (L) 08/13/2017   HCT 35.3 (L) 08/13/2017   MCV 72.0 (L) 08/13/2017   PLT 113 (L) 08/13/2017   BMET    Component Value Date/Time   NA 137 08/13/2017 0526   NA 142 04/16/2014 1213   K 4.0 08/13/2017 0526   K 4.3 04/16/2014 1213   CL 103 08/13/2017 0526   CO2 26 08/13/2017 0526   CO2 21 (L) 04/16/2014 1213   GLUCOSE 194 (H) 08/13/2017 0526   GLUCOSE 88 04/16/2014 1213   BUN 18 08/13/2017 0526   BUN 12.2 04/16/2014 1213   CREATININE 0.83 08/13/2017 0526   CREATININE 0.8 04/16/2014 1213   CALCIUM 9.7 08/13/2017 0526   CALCIUM 9.2 04/16/2014 1213   GFRNONAA >60 08/13/2017 0526   GFRAA >60 08/13/2017 0526     Assessment/Plan: 1 Day Post-Op   Active Problems:   Fibromyalgia   Migraine   COPD (chronic obstructive pulmonary disease) (HCC)   Tobacco abuse   Sepsis (HCC)   Acute on chronic respiratory failure with hypoxia (HCC)   Acute renal failure (ARF) (HCC)   Transaminitis   Acute on chronic diastolic CHF (congestive heart failure) (HCC)   Elevated troponin   Sepsis secondary to UTI (HCC)   Acute metabolic encephalopathy   Bimalleolar fracture of left ankle   Up with therapy NWB LLE Elevate lovenox in house with RLE SCD for DVT ppx Dc home on bid asa for DVT ppx PT/OT   Nicholes Stairs 08/13/2017, 9:53 AM   Geralynn Rile, MD [phone]

## 2017-08-13 NOTE — Discharge Summary (Addendum)
Physician Discharge Summary  Teresa Roberts ZWC:[id number] DOB: 06/29/1961 DOA: 08/09/2017  PCP: Jonathon Jordan, MD  Admit date: 08/09/2017 Discharge date: 08/13/2017  Admitted From: Home Disposition:  home  Recommendations for Outpatient Follow-up:  1. Follow up with PCP in 1 week with repeat CBC/CMP  2. Follow-up with cardiology in 2-3 weeks 3. Follow-up with orthopedics in 1-2 weeks. Activity and wound care as per orthopedic recommendations 4. Follow-up with pulmonary in 2-3 weeks 5. Counseled about tobacco cessation 6. Patient would benefit from outpatient follow-up with her psychiatrist to adjust her medications 7. Follow up in the ED if symptoms worsen or new appear   Home Health: yes  Equipment/Devices: oxygen via nasal cannula continuously 4-5 L/m as per the patient  Discharge Condition: stable  CODE STATUS: full Diet recommendation: Heart Healthy   Brief/Interim Summary: 56 year old female with history of hyperlipidemia, hypertension, PTSD, fibromyalgia, COPD, chronic hypoxic respiratory failure on home oxygen presented on 08/09/2017 with confusion. She was found to have probable pneumonia versus UTI along with acute kidney injury, abnormal LFTs and positive troponin. Cardiology was consulted. She was started on IV fluids and broad spectrum antibiotics. Solu-Medrol was also started for COPD exacerbation. She was found to have left ankle fracture for which she underwent ORIF on 08/12/2017. Her condition has improved, she is on oral antibiotics. Orthopedics has cleared the patient for discharge. She'll be discharged home today after PT evaluation  Discharge Diagnoses:  Active Problems:   Fibromyalgia   Migraine   COPD (chronic obstructive pulmonary disease) (HCC)   Tobacco abuse   Sepsis ([place])   Acute on chronic respiratory failure with hypoxia (HCC)   Acute renal failure (ARF) (HCC)   Transaminitis   Acute on chronic diastolic CHF (congestive heart failure) (HCC)   Elevated  troponin   Sepsis secondary to UTI ([place])   Acute metabolic encephalopathy   Bimalleolar fracture of left ankle  Sepsis: - likely multifactorial including secondary to UTI and possible CAP.  - Blood cultures negative so far. Urine culture probably contaminant. Repeat urine negative so far - Blood pressure improved - sepsis has resolved  Left ankle fracture - Probably secondary to falls at home - Status post ORIF by orthopedics on 08/12/2017. Weight appearing and wound care as per orthopedics recommendations. PT evaluation. Patient might need home PT and home nursing evaluation and follow-up. Outpatient follow-up with orthopedics. - aspirin twice a day as per orthopedics recommendations for DVT prophylaxis  UTI: - repeat urine culture negative.  Acute on chronic hypoxic respiratory failure:  - Respiratory status back to baseline. Continue nasal cannula oxygen supplementation. Chest x-ray was not clear if the patient had any pneumonia. Continue Levaquin for 2 more days - Echo unremarkable  COPD with probable exacerbation - improving. Currently on Solu-Medrol. Switch to oral prednisone 40 mg daily for 7 days then discontinue. Outpatient follow-up with pulmonary. Continue home Symbicort and Spiriva - Outpatient follow-up with pulmonary  Leukocytosis - Probably reactive  Tobacco abuse ongoing - Counseled about tobacco cessation  Chronic diastolic heart failure - Blood pressure improved. Cardiology following. Monitor strict input and output and daily weights - Echo unremarkable. Outpatient follow-up with cardiology  Elevated Troponin: likely secondary demand and hypoperfusion from sepsis. Recent heart cath (01/2017) by Dr. Claiborne Billings showing non-obstructive CAD.  - outpatient follow-up  Acute Renal failure:  -Creatinine improved.   Acute Transaminitis probably from shock liver:  - LFTs improving. outpatient follow-up off LFTs. Ultrasound does not show gallstones or  cholecystitis or biliary dilatation  HTN:  -  Blood pressure stable. Outpatient follow-up  Acute metabolic encephalopathy leading to altered mental status:  -likely from sepsis, hypoxemia, and mild uremia.  -Improved  Depression: - patient would benefit from psychiatric evaluation as an outpatient  Chronic MSK/Neuropathic pain: - Stable. Continue Neurontin and Robaxin   HA: h/o migraines Outpatient follow-up  Discharge Instructions  Discharge Instructions    Call MD for:  difficulty breathing, headache or visual disturbances    Complete by:  As directed    Call MD for:  extreme fatigue    Complete by:  As directed    Call MD for:  hives    Complete by:  As directed    Call MD for:  persistant dizziness or light-headedness    Complete by:  As directed    Call MD for:  persistant nausea and vomiting    Complete by:  As directed    Call MD for:  severe uncontrolled pain    Complete by:  As directed    Call MD for:  temperature >100.4    Complete by:  As directed    Diet - low sodium heart healthy    Complete by:  As directed    Discharge instructions    Complete by:  As directed    activity as per orthopedics/PT recommendations   Increase activity slowly    Complete by:  As directed      Allergies as of 08/13/2017      Reactions   Citrus Hives   Lisinopril Cough      Medication List    STOP taking these medications   Fluticasone-Umeclidin-Vilant 100-62.5-25 MCG/INH Aepb Commonly known as:  TRELEGY ELLIPTA   metaxalone 800 MG tablet Commonly known as:  SKELAXIN   nicotine 7 mg/24hr patch Commonly known as:  NICODERM CQ - dosed in mg/24 hr     TAKE these medications   Acidophilus Lactobacillus Caps Take 2 capsules by mouth every morning.   ARIPiprazole 10 MG tablet Commonly known as:  ABILIFY Take 5 mg by mouth every morning.   aspirin 325 MG EC tablet Take 1 tablet (325 mg total) by mouth 2 (two) times daily.   Biotin 1 MG Caps Take 1 mg by  mouth daily.   budesonide-formoterol 160-4.5 MCG/ACT inhaler Commonly known as:  SYMBICORT Inhale 2 puffs into the lungs 2 (two) times daily.   busPIRone 15 MG tablet Commonly known as:  BUSPAR Take 15 mg by mouth 2 (two) times daily.   butalbital-acetaminophen-caffeine 50-325-40 MG tablet Commonly known as:  FIORICET, ESGIC Take 1 tablet by mouth 2 (two) times daily as needed for headache.   Carboxymethylcellulose Sod PF 0.25 % Soln Apply 1 drop to eye 2 (two) times daily.   docusate sodium 100 MG capsule Commonly known as:  COLACE Take 100 mg by mouth 3 (three) times daily.   DULoxetine 30 MG capsule Commonly known as:  CYMBALTA TAKE ONE CAPSULE BY MOUTH EVERY DAY What changed:  See the new instructions.   fluticasone 50 MCG/ACT nasal spray Commonly known as:  FLONASE Place 2 sprays into both nostrils daily.   gabapentin 800 MG tablet Commonly known as:  NEURONTIN Take 800 mg by mouth 2 (two) times daily.   hydrochlorothiazide 25 MG tablet Commonly known as:  HYDRODIURIL Take 1 tablet (25 mg total) by mouth daily.   HYDROcodone-acetaminophen 5-325 MG tablet Commonly known as:  NORCO/VICODIN Take 1-2 tablets by mouth every 6 (six) hours as needed for moderate pain (breakthrough pain).  latanoprost 0.005 % ophthalmic solution Commonly known as:  XALATAN Place 1 drop into both eyes at bedtime.   levofloxacin 500 MG tablet Commonly known as:  LEVAQUIN Take 1 tablet (500 mg total) by mouth daily.   methocarbamol 500 MG tablet Commonly known as:  ROBAXIN Take 1 tablet (500 mg total) by mouth every 8 (eight) hours as needed for muscle spasms. What changed:  when to take this  reasons to take this   omeprazole 20 MG tablet Commonly known as:  PRILOSEC OTC Take 40 mg by mouth daily.   predniSONE 20 MG tablet Commonly known as:  DELTASONE Take 2 tablets (40 mg total) by mouth daily with breakfast.   PROVENTIL HFA 108 (90 Base) MCG/ACT inhaler Generic drug:   albuterol Inhale 2 puffs into the lungs 4 (four) times daily as needed for wheezing or shortness of breath.   rizatriptan 10 MG disintegrating tablet Commonly known as:  MAXALT-MLT Take 10 mg by mouth as needed for migraine. May repeat in 2 hours if needed   simvastatin 10 MG tablet Commonly known as:  ZOCOR Take 10 mg by mouth at bedtime.   SPIRIVA RESPIMAT 1.25 MCG/ACT Aers Generic drug:  Tiotropium Bromide Monohydrate Inhale 2 puffs into the lungs daily.   topiramate 25 MG tablet Commonly known as:  TOPAMAX Take 50 mg by mouth every morning.   traZODone 100 MG tablet Commonly known as:  DESYREL Take 100 mg by mouth at bedtime as needed for sleep.   vitamin B-12 100 MCG tablet Commonly known as:  CYANOCOBALAMIN Take 100 mcg by mouth daily.            Discharge Care Instructions        Start     Ordered   08/14/17 0000  levofloxacin (LEVAQUIN) 500 MG tablet  Daily     08/13/17 1224   08/13/17 0000  methocarbamol (ROBAXIN) 500 MG tablet  Every 8 hours PRN     08/13/17 1224   08/13/17 0000  HYDROcodone-acetaminophen (NORCO/VICODIN) 5-325 MG tablet  Every 6 hours PRN     08/13/17 1224   08/13/17 0000  Increase activity slowly     08/13/17 1224   08/13/17 0000  Diet - low sodium heart healthy     08/13/17 1224   08/13/17 0000  Discharge instructions    Comments:  activity as per orthopedics/PT recommendations   08/13/17 1224   08/13/17 0000  Call MD for:  temperature >100.4     08/13/17 1224   08/13/17 0000  Call MD for:  persistant nausea and vomiting     08/13/17 1224   08/13/17 0000  Call MD for:  severe uncontrolled pain     08/13/17 1224   08/13/17 0000  Call MD for:  difficulty breathing, headache or visual disturbances     08/13/17 1224   08/13/17 0000  Call MD for:  hives     08/13/17 1224   08/13/17 0000  Call MD for:  persistant dizziness or light-headedness     08/13/17 1224   08/13/17 0000  Call MD for:  extreme fatigue     08/13/17 1224    08/13/17 0000  predniSONE (DELTASONE) 20 MG tablet  Daily with breakfast     08/13/17 1224   08/13/17 0000  aspirin 325 MG EC tablet  2 times daily     08/13/17 1231      Follow-up Information    Swinteck, Aaron Edelman, MD. Schedule an appointment as soon as  possible for a visit in 2 week(s).   Specialty:  Orthopedic Surgery Why:  For wound re-check, For suture removal Contact information: [address] [place] [address] [phone]        Jonathon Jordan, MD. Schedule an appointment as soon as possible for a visit in 1 week(s).   Specialty:  Family Medicine Contact information: [place] [address] [place] [address] [phone]        Rigoberto Noel, MD. Schedule an appointment as soon as possible for a visit in 2 week(s).   Specialty:  Pulmonary Disease Contact information: [address] [address] [phone]        Josue Hector, MD. Schedule an appointment as soon as possible for a visit in 2 week(s).   Specialty:  Cardiology Contact information: [address] [place] [address] [phone]          Allergies  Allergen Reactions  . Citrus Hives  . Lisinopril Cough    Consultations:  Cardiology and orthopedics   Procedures/Studies: Dg Chest 2 View  Result Date: 08/11/2017 CLINICAL DATA:  Shortness of breath today EXAM: CHEST  2 VIEW COMPARISON:  08/10/2017 FINDINGS: Cardiomegaly hilar fullness is again noted bilaterally. When compared to prior chest CT, no adenopathy was present and therefore this is felt to be vascular. No overt edema. Linear areas of scarring or atelectasis in the lingula. No effusions. No acute bony abnormality. IMPRESSION: Cardiomegaly.  Left lower lung atelectasis or scarring. Stable bilateral hilar prominence, likely vascular. Electronically Signed   By: Rolm Baptise M.D.   On: 08/11/2017 09:05   Dg Chest 2 View  Result Date: 08/10/2017 CLINICAL DATA:   Shortness of breath. History of stepped cysts and CHF. Also COPD current smoker. EXAM: CHEST  2 VIEW COMPARISON:  Portable chest x-ray of August 09, 2017 FINDINGS: The lungs are adequately inflated. There is bilateral hilar prominence which is stable. The pulmonary vascularity appears engorged with some cephalization. The interstitial markings are less prominent today. There remain increased densities lateral to the left heart border. IMPRESSION: Slight interval improvement in the appearance of the chest with decreased interstitial edema. Persistent bilateral hilar prominence may reflect the congested pulmonary vascularity though underlying lymphadenopathy is not excluded. Electronically Signed   By: David  Martinique M.D.   On: 08/10/2017 07:50   Dg Ankle 2 Views Left  Result Date: 08/10/2017 CLINICAL DATA:  56 year old who fell 5 days ago. Persistent left ankle pain. Initial encounter. EXAM: LEFT ANKLE - 2 VIEW COMPARISON:  None. FINDINGS: Lateral soft tissue swelling. Comminuted mildly displaced oblique fracture involving the distal fibula. Tiny avulsion fracture arising from the medial malleolus. Slight lateral subluxation of the talus relative to the tibial plafond. IMPRESSION: 1. Comminuted mildly displaced oblique fracture involving the distal fibula. 2. Tiny avulsion fracture arising from the medial malleolus. 3. Slight lateral subluxation of the talus relative to the tibia. Electronically Signed   By: Evangeline Dakin M.D.   On: 08/10/2017 18:46   Dg Ankle Complete Left  Result Date: 08/12/2017 CLINICAL DATA:  ORIF left ankle. EXAM: DG C-ARM 61-120 MIN; LEFT ANKLE COMPLETE - 3+ VIEW COMPARISON:  No recent prior. FINDINGS: Plate and screw fixation of the distal fibula noted. Metallic density noted adjacent to the distal fibula this appears to be at a site of prior screw fixation. IMPRESSION: Postsurgical changes left ankle as above. Electronically Signed   By: Marcello Moores  Register   On: 08/12/2017 08:59  Ct Head Wo Contrast  Result Date: 08/09/2017 CLINICAL DATA:  Altered level of consciousness, recent falls, initial encounter EXAM: CT HEAD WITHOUT CONTRAST TECHNIQUE: Contiguous axial images were obtained from the base of the skull through the vertex without intravenous contrast. COMPARISON:  None. FINDINGS: Brain: No evidence of acute infarction, hemorrhage, hydrocephalus, extra-axial collection or mass lesion/mass effect. Vascular: No hyperdense vessel or unexpected calcification. Skull: Normal. Negative for fracture or focal lesion. Sinuses/Orbits: No acute finding. Other: None. IMPRESSION: No acute intracranial abnormality noted. Electronically Signed   By: Inez Catalina M.D.   On: 08/09/2017 11:01   Dg Chest Portable 1 View  Result Date: 08/09/2017 CLINICAL DATA:  Hypoxia, tachycardia, altered mental status. EXAM: PORTABLE CHEST 1 VIEW COMPARISON:  01/26/2017 FINDINGS: Cardiomegaly with vascular congestion and bilateral lower lobe airspace opacities which could reflect edema or infection. Suspect small right effusion. No acute bony abnormality. IMPRESSION: Cardiomegaly with vascular congestion and bilateral lower lobe opacities, edema versus infection. Small right effusion. Electronically Signed   By: Rolm Baptise M.D.   On: 08/09/2017 10:26   Dg Knee Complete 4 Views Left  Result Date: 08/11/2017 CLINICAL DATA:  LEFT ankle fracture. EXAM: LEFT KNEE - COMPLETE 4+ VIEW COMPARISON:  None. FINDINGS: No evidence of fracture, dislocation, or joint effusion. No evidence of arthropathy or other focal bone abnormality. Linear densities distal femur suggesting growth lines. Soft tissues are unremarkable. IMPRESSION: Negative. Electronically Signed   By: Elon Alas M.D.   On: 08/11/2017 16:52   Dg Ankle Left Port  Result Date: 08/12/2017 CLINICAL DATA:  Status post ORIF left ankle EXAM: PORTABLE LEFT ANKLE - 2 VIEW COMPARISON:  Intraoperative films from earlier in the same day. FINDINGS: Fixation  sideplate is noted along the distal fibula. Multiple fixation screws are noted. Additionally a fixation device traversing the distal tibia is noted. Fracture fragments are in anatomic alignment. Overlying casting material limits fine bony detail. IMPRESSION: Status post ORIF of left fibular fracture. Electronically Signed   By: Inez Catalina M.D.   On: 08/12/2017 09:40   Dg C-arm 1-60 Min  Result Date: 08/12/2017 CLINICAL DATA:  ORIF left ankle. EXAM: DG C-ARM 61-120 MIN; LEFT ANKLE COMPLETE - 3+ VIEW COMPARISON:  No recent prior. FINDINGS: Plate and screw fixation of the distal fibula noted. Metallic density noted adjacent to the distal fibula this appears to be at a site of prior screw fixation. IMPRESSION: Postsurgical changes left ankle as above. Electronically Signed   By: Marcello Moores  Register   On: 08/12/2017 08:59   US Abdomen Limited Ruq  Result Date: 08/09/2017 CLINICAL DATA:  Elevated liver enzymes. EXAM: ULTRASOUND ABDOMEN LIMITED RIGHT UPPER QUADRANT COMPARISON:  09/12/2014. FINDINGS: Gallbladder: No gallstones or wall thickening visualized. No sludge noted on today's exam. No sonographic Murphy sign noted by sonographer. Common bile duct: Diameter: 3.6 mm Liver: Increase echogenicity consistent fatty infiltration and/or hepatocellular disease. No focal hepatic abnormality identified. Portal vein is patent on color Doppler imaging with normal direction of blood flow towards the liver. IMPRESSION: 1. Increased hepatic echogenicity consistent fatty infiltration or hepatocellular disease. Similar findings noted on prior exam. 2. No acute abnormality identified. No evidence of gallstones or sludge noted on today's exam. No biliary distention. Electronically Signed   By: Marcello Moores  Register   On: 08/09/2017 12:42    Study Conclusions  - Left ventricle: The cavity size was normal. Systolic function was   normal. The estimated ejection fraction was in the range of 55%   to 60%. Wall motion  was normal;  there were no regional wall   motion abnormalities. Left ventricular diastolic function   parameters were normal. - Ventricular septum: The contour showed systolic flattening. These   changes are consistent with RV pressure overload. - Right ventricle: The cavity size was severely dilated. Wall   thickness was normal. Systolic function was mildly reduced. - Right atrium: The atrium was moderately dilated. - Atrial septum: No defect or patent foramen ovale was identified. - Pulmonary arteries: Systolic pressure was moderately increased.   PA peak pressure: 55 mm Hg (S).  Left ankle ORIF on 08/12/2017  Subjective: Patient seen and examined at bedside. She feels better and thinks that she is ready to go home. No overnight fever, nausea or vomiting  Discharge Exam: Vitals:   08/13/17 0826 08/13/17 1100  BP: 126/78   Pulse: 61   Resp: 18   Temp:  97.8 F (36.6 C)  SpO2: 100% 98%   Vitals:   08/12/17 2002 08/13/17 0518 08/13/17 0826 08/13/17 1100  BP: 110/76 116/78 126/78   Pulse: 64 (!) 59 61   Resp: 18 16 18    Temp: 98.2 F (36.8 C) 97.8 F (36.6 C)  97.8 F (36.6 C)  TempSrc: Oral Oral  Oral  SpO2: 99% 94% 100% 98%  Weight:      Height:        General: Pt is alert, awake, not in acute distress Cardiovascular: rate controlled, S1/S2 + Respiratory: bilateral decreased breath sound at bases Abdominal: Soft, NT, ND, bowel sounds + Extremities: Left lower extremity has a splint, no cyanosis    The results of significant diagnostics from this hospitalization (including imaging, microbiology, ancillary and laboratory) are listed below for reference.     Microbiology: Recent Results (from the past 240 hour(s))  Blood culture (routine x 2)     Status: None (Preliminary result)   Collection Time: 08/09/17 10:00 AM  Result Value Ref Range Status   Specimen Description BLOOD RIGHT ANTECUBITAL  Final   Special Requests   Final    BOTTLES DRAWN AEROBIC AND ANAEROBIC Blood  Culture adequate volume   Culture NO GROWTH 3 DAYS  Final   Report Status PENDING  Incomplete  Blood culture (routine x 2)     Status: None (Preliminary result)   Collection Time: 08/09/17 10:15 AM  Result Value Ref Range Status   Specimen Description BLOOD RIGHT FOREARM  Final   Special Requests   Final    BOTTLES DRAWN AEROBIC AND ANAEROBIC Blood Culture adequate volume   Culture NO GROWTH 3 DAYS  Final   Report Status PENDING  Incomplete  Urine culture     Status: Abnormal   Collection Time: 08/09/17 10:39 AM  Result Value Ref Range Status   Specimen Description URINE, RANDOM  Final   Special Requests NONE  Final   Culture MULTIPLE SPECIES PRESENT, SUGGEST RECOLLECTION (A)  Final   Report Status 08/10/2017 FINAL  Final  MRSA PCR Screening     Status: None   Collection Time: 08/09/17 10:51 PM  Result Value Ref Range Status   MRSA by PCR NEGATIVE NEGATIVE Final    Comment:        The GeneXpert MRSA Assay (FDA approved for NASAL specimens only), is one component of a comprehensive MRSA colonization surveillance program. It is not intended to diagnose MRSA infection nor to guide or monitor treatment for MRSA infections.      Labs: BNP (last 3 results)  Recent Labs  01/26/17 0717 08/09/17  0956  BNP 224.3* 1,610.9*   Basic Metabolic Panel:  Recent Labs Lab 08/09/17 0955 08/09/17 1259 08/10/17 0054 08/11/17 0559 08/12/17 0432 08/13/17 0526  NA 139  --  143 139 139 137  K 4.7  --  3.1* 3.9 4.0 4.0  CL 103  --  102 102 102 103  CO2 21*  --  31 27 30 26   GLUCOSE 104*  --  88 140* 157* 194*  BUN 81*  --  48* 22* 21* 18  CREATININE 2.45*  --  1.34* 0.79 0.84 0.83  CALCIUM 9.9  --  9.5 10.0 10.2 9.7  MG  --  2.5*  --  2.0 1.9 1.8  PHOS  --  3.3  --   --   --   --    Liver Function Tests:  Recent Labs Lab 08/09/17 0955 08/10/17 0054 08/11/17 0559 08/12/17 0432 08/13/17 0526  AST 482* 904* 922* 265* 68*  ALT 405* 793* 1,486* 1,178* 603*  ALKPHOS 149*  135* 130* 124 109  BILITOT 2.7* 2.1* 1.5* 1.0 1.0  PROT 6.9 5.9* 6.3* 6.1* 5.4*  ALBUMIN 3.5 3.0* 2.9* 2.9* 2.7*    Recent Labs Lab 08/09/17 0955  LIPASE 57*    Recent Labs Lab 08/09/17 1205 08/09/17 1259  AMMONIA 54* 48*   CBC:  Recent Labs Lab 08/09/17 0955 08/10/17 0054 08/11/17 0559 08/12/17 0432 08/13/17 0526  WBC 16.3* 11.8* 8.6 13.4* 14.9*  NEUTROABS 13.4*  --  8.0* 12.0* 13.0*  HGB 11.3* 10.0* 10.7* 10.7* 10.2*  HCT 37.0 33.8* 35.5* 35.7* 35.3*  MCV 71.4* 71.2* 71.1* 71.4* 72.0*  PLT 163 116* 118* 122* 113*   Cardiac Enzymes:  Recent Labs Lab 08/09/17 0955 08/09/17 1259 08/09/17 1912 08/10/17 0054  CKTOTAL 183  --   --   --   TROPONINI  --  1.13* 1.62* 1.67*   BNP: Invalid input(s): POCBNP CBG:  Recent Labs Lab 08/09/17 0946  GLUCAP 106*   D-Dimer No results for input(s): DDIMER in the last 72 hours. Hgb A1c No results for input(s): HGBA1C in the last 72 hours. Lipid Profile No results for input(s): CHOL, HDL, LDLCALC, TRIG, CHOLHDL, LDLDIRECT in the last 72 hours. Thyroid function studies No results for input(s): TSH, T4TOTAL, T3FREE, THYROIDAB in the last 72 hours.  Invalid input(s): FREET3 Anemia work up No results for input(s): VITAMINB12, FOLATE, FERRITIN, TIBC, IRON, RETICCTPCT in the last 72 hours. Urinalysis    Component Value Date/Time   COLORURINE YELLOW 08/09/2017 1039   APPEARANCEUR CLEAR 08/09/2017 1039   LABSPEC 1.016 08/09/2017 1039   PHURINE 5.0 08/09/2017 1039   GLUCOSEU NEGATIVE 08/09/2017 1039   HGBUR MODERATE (A) 08/09/2017 1039   BILIRUBINUR NEGATIVE 08/09/2017 1039   KETONESUR NEGATIVE 08/09/2017 1039   PROTEINUR NEGATIVE 08/09/2017 1039   UROBILINOGEN 1.0 09/12/2014 1952   NITRITE NEGATIVE 08/09/2017 1039   LEUKOCYTESUR MODERATE (A) 08/09/2017 1039   Sepsis Labs Invalid input(s): PROCALCITONIN,  WBC,  LACTICIDVEN Microbiology Recent Results (from the past 240 hour(s))  Blood culture (routine x 2)      Status: None (Preliminary result)   Collection Time: 08/09/17 10:00 AM  Result Value Ref Range Status   Specimen Description BLOOD RIGHT ANTECUBITAL  Final   Special Requests   Final    BOTTLES DRAWN AEROBIC AND ANAEROBIC Blood Culture adequate volume   Culture NO GROWTH 3 DAYS  Final   Report Status PENDING  Incomplete  Blood culture (routine x 2)     Status: None (  Preliminary result)   Collection Time: 08/09/17 10:15 AM  Result Value Ref Range Status   Specimen Description BLOOD RIGHT FOREARM  Final   Special Requests   Final    BOTTLES DRAWN AEROBIC AND ANAEROBIC Blood Culture adequate volume   Culture NO GROWTH 3 DAYS  Final   Report Status PENDING  Incomplete  Urine culture     Status: Abnormal   Collection Time: 08/09/17 10:39 AM  Result Value Ref Range Status   Specimen Description URINE, RANDOM  Final   Special Requests NONE  Final   Culture MULTIPLE SPECIES PRESENT, SUGGEST RECOLLECTION (A)  Final   Report Status 08/10/2017 FINAL  Final  MRSA PCR Screening     Status: None   Collection Time: 08/09/17 10:51 PM  Result Value Ref Range Status   MRSA by PCR NEGATIVE NEGATIVE Final    Comment:        The GeneXpert MRSA Assay (FDA approved for NASAL specimens only), is one component of a comprehensive MRSA colonization surveillance program. It is not intended to diagnose MRSA infection nor to guide or monitor treatment for MRSA infections.      Time coordinating discharge: 35 minutes  SIGNED:   Aline August, MD  [facility] 08/13/2017, 12:26 PM Pager: [phone]  If 7PM-7AM, please contact night-coverage [email or web address] Password [email or web address]

## 2017-08-13 NOTE — Plan of Care (Signed)
Problem: Safety: Goal: Ability to remain free from injury will improve Outcome: Progressing No falls, skin break down or other injuries this shift.   Problem: Pain Managment: Goal: General experience of comfort will improve Outcome: Progressing Pt complained of left leg pain and requested pain medication 2x this shift. PRN pain meds given per orders.   Problem: Tissue Perfusion: Goal: Risk factors for ineffective tissue perfusion will decrease Outcome: Progressing Pt stated that she felt like she could breath better throughout the night. O2 remained WNL this shift.

## 2017-08-14 ENCOUNTER — Encounter (HOSPITAL_COMMUNITY): Payer: Self-pay | Admitting: Orthopedic Surgery

## 2017-08-14 LAB — CULTURE, BLOOD (ROUTINE X 2)
CULTURE: NO GROWTH
CULTURE: NO GROWTH
Special Requests: ADEQUATE
Special Requests: ADEQUATE

## 2017-08-18 DIAGNOSIS — D472 Monoclonal gammopathy: Secondary | ICD-10-CM | POA: Diagnosis not present

## 2017-08-18 DIAGNOSIS — S82899A Other fracture of unspecified lower leg, initial encounter for closed fracture: Secondary | ICD-10-CM | POA: Diagnosis not present

## 2017-08-18 DIAGNOSIS — Z79899 Other long term (current) drug therapy: Secondary | ICD-10-CM | POA: Diagnosis not present

## 2017-08-19 DIAGNOSIS — N390 Urinary tract infection, site not specified: Secondary | ICD-10-CM | POA: Diagnosis not present

## 2017-08-19 DIAGNOSIS — S8265XD Nondisplaced fracture of lateral malleolus of left fibula, subsequent encounter for closed fracture with routine healing: Secondary | ICD-10-CM | POA: Diagnosis not present

## 2017-08-19 DIAGNOSIS — J449 Chronic obstructive pulmonary disease, unspecified: Secondary | ICD-10-CM | POA: Diagnosis not present

## 2017-08-26 DIAGNOSIS — S82842D Displaced bimalleolar fracture of left lower leg, subsequent encounter for closed fracture with routine healing: Secondary | ICD-10-CM | POA: Diagnosis not present

## 2017-08-30 DIAGNOSIS — J449 Chronic obstructive pulmonary disease, unspecified: Secondary | ICD-10-CM | POA: Diagnosis not present

## 2017-08-30 DIAGNOSIS — S8265XD Nondisplaced fracture of lateral malleolus of left fibula, subsequent encounter for closed fracture with routine healing: Secondary | ICD-10-CM | POA: Diagnosis not present

## 2017-08-30 DIAGNOSIS — N390 Urinary tract infection, site not specified: Secondary | ICD-10-CM | POA: Diagnosis not present

## 2017-09-02 DIAGNOSIS — J449 Chronic obstructive pulmonary disease, unspecified: Secondary | ICD-10-CM | POA: Diagnosis not present

## 2017-09-02 DIAGNOSIS — N390 Urinary tract infection, site not specified: Secondary | ICD-10-CM | POA: Diagnosis not present

## 2017-09-02 DIAGNOSIS — S8265XD Nondisplaced fracture of lateral malleolus of left fibula, subsequent encounter for closed fracture with routine healing: Secondary | ICD-10-CM | POA: Diagnosis not present

## 2017-09-06 DIAGNOSIS — S8265XD Nondisplaced fracture of lateral malleolus of left fibula, subsequent encounter for closed fracture with routine healing: Secondary | ICD-10-CM | POA: Diagnosis not present

## 2017-09-06 DIAGNOSIS — J449 Chronic obstructive pulmonary disease, unspecified: Secondary | ICD-10-CM | POA: Diagnosis not present

## 2017-09-06 DIAGNOSIS — N390 Urinary tract infection, site not specified: Secondary | ICD-10-CM | POA: Diagnosis not present

## 2017-09-08 DIAGNOSIS — S8265XD Nondisplaced fracture of lateral malleolus of left fibula, subsequent encounter for closed fracture with routine healing: Secondary | ICD-10-CM | POA: Diagnosis not present

## 2017-09-08 DIAGNOSIS — N390 Urinary tract infection, site not specified: Secondary | ICD-10-CM | POA: Diagnosis not present

## 2017-09-08 DIAGNOSIS — J449 Chronic obstructive pulmonary disease, unspecified: Secondary | ICD-10-CM | POA: Diagnosis not present

## 2017-09-12 DIAGNOSIS — J449 Chronic obstructive pulmonary disease, unspecified: Secondary | ICD-10-CM | POA: Diagnosis not present

## 2017-09-13 DIAGNOSIS — J449 Chronic obstructive pulmonary disease, unspecified: Secondary | ICD-10-CM | POA: Diagnosis not present

## 2017-09-13 DIAGNOSIS — N390 Urinary tract infection, site not specified: Secondary | ICD-10-CM | POA: Diagnosis not present

## 2017-09-13 DIAGNOSIS — S8265XD Nondisplaced fracture of lateral malleolus of left fibula, subsequent encounter for closed fracture with routine healing: Secondary | ICD-10-CM | POA: Diagnosis not present

## 2017-09-15 DIAGNOSIS — S8265XD Nondisplaced fracture of lateral malleolus of left fibula, subsequent encounter for closed fracture with routine healing: Secondary | ICD-10-CM | POA: Diagnosis not present

## 2017-09-15 DIAGNOSIS — J449 Chronic obstructive pulmonary disease, unspecified: Secondary | ICD-10-CM | POA: Diagnosis not present

## 2017-09-15 DIAGNOSIS — N390 Urinary tract infection, site not specified: Secondary | ICD-10-CM | POA: Diagnosis not present

## 2017-09-21 DIAGNOSIS — S8262XD Displaced fracture of lateral malleolus of left fibula, subsequent encounter for closed fracture with routine healing: Secondary | ICD-10-CM | POA: Diagnosis not present

## 2017-09-22 DIAGNOSIS — N390 Urinary tract infection, site not specified: Secondary | ICD-10-CM | POA: Diagnosis not present

## 2017-09-22 DIAGNOSIS — S8265XD Nondisplaced fracture of lateral malleolus of left fibula, subsequent encounter for closed fracture with routine healing: Secondary | ICD-10-CM | POA: Diagnosis not present

## 2017-09-22 DIAGNOSIS — J449 Chronic obstructive pulmonary disease, unspecified: Secondary | ICD-10-CM | POA: Diagnosis not present

## 2017-09-28 DIAGNOSIS — M25672 Stiffness of left ankle, not elsewhere classified: Secondary | ICD-10-CM | POA: Diagnosis not present

## 2017-09-29 ENCOUNTER — Ambulatory Visit (HOSPITAL_BASED_OUTPATIENT_CLINIC_OR_DEPARTMENT_OTHER): Payer: 59 | Admitting: Hematology and Oncology

## 2017-09-29 ENCOUNTER — Encounter: Payer: Self-pay | Admitting: Hematology and Oncology

## 2017-09-29 ENCOUNTER — Telehealth: Payer: Self-pay | Admitting: Hematology and Oncology

## 2017-09-29 VITALS — BP 135/79 | HR 94 | Temp 98.3°F | Resp 19 | Ht 66.0 in | Wt 191.0 lb

## 2017-09-29 DIAGNOSIS — D649 Anemia, unspecified: Secondary | ICD-10-CM

## 2017-09-29 DIAGNOSIS — D472 Monoclonal gammopathy: Secondary | ICD-10-CM | POA: Diagnosis not present

## 2017-09-29 NOTE — Telephone Encounter (Signed)
Gave patient avs and calendar with appts per 11/15 los.  °

## 2017-09-30 ENCOUNTER — Other Ambulatory Visit (HOSPITAL_BASED_OUTPATIENT_CLINIC_OR_DEPARTMENT_OTHER): Payer: 59

## 2017-09-30 DIAGNOSIS — D472 Monoclonal gammopathy: Secondary | ICD-10-CM

## 2017-09-30 DIAGNOSIS — D649 Anemia, unspecified: Secondary | ICD-10-CM

## 2017-09-30 LAB — COMPREHENSIVE METABOLIC PANEL
ALT: 32 U/L (ref 0–55)
ANION GAP: 10 meq/L (ref 3–11)
AST: 22 U/L (ref 5–34)
Albumin: 3.6 g/dL (ref 3.5–5.0)
Alkaline Phosphatase: 161 U/L — ABNORMAL HIGH (ref 40–150)
BUN: 8.8 mg/dL (ref 7.0–26.0)
CALCIUM: 10.4 mg/dL (ref 8.4–10.4)
CHLORIDE: 108 meq/L (ref 98–109)
CO2: 24 mEq/L (ref 22–29)
CREATININE: 0.8 mg/dL (ref 0.6–1.1)
Glucose: 96 mg/dl (ref 70–140)
POTASSIUM: 3.7 meq/L (ref 3.5–5.1)
Sodium: 141 mEq/L (ref 136–145)
Total Bilirubin: 0.29 mg/dL (ref 0.20–1.20)
Total Protein: 7.6 g/dL (ref 6.4–8.3)

## 2017-09-30 LAB — IRON AND TIBC
%SAT: 17 % — ABNORMAL LOW (ref 21–57)
IRON: 69 ug/dL (ref 41–142)
TIBC: 411 ug/dL (ref 236–444)
UIBC: 343 ug/dL (ref 120–384)

## 2017-09-30 LAB — CBC WITH DIFFERENTIAL/PLATELET
BASO%: 1.4 % (ref 0.0–2.0)
BASOS ABS: 0.1 10*3/uL (ref 0.0–0.1)
EOS ABS: 0.2 10*3/uL (ref 0.0–0.5)
EOS%: 3.6 % (ref 0.0–7.0)
HEMATOCRIT: 44.8 % (ref 34.8–46.6)
HGB: 13.6 g/dL (ref 11.6–15.9)
LYMPH#: 1.3 10*3/uL (ref 0.9–3.3)
LYMPH%: 24.3 % (ref 14.0–49.7)
MCH: 23.3 pg — AB (ref 25.1–34.0)
MCHC: 30.4 g/dL — AB (ref 31.5–36.0)
MCV: 76.8 fL — AB (ref 79.5–101.0)
MONO#: 0.4 10*3/uL (ref 0.1–0.9)
MONO%: 7 % (ref 0.0–14.0)
NEUT#: 3.5 10*3/uL (ref 1.5–6.5)
NEUT%: 63.7 % (ref 38.4–76.8)
PLATELETS: 177 10*3/uL (ref 145–400)
RBC: 5.83 10*6/uL — ABNORMAL HIGH (ref 3.70–5.45)
RDW: 29.2 % — ABNORMAL HIGH (ref 11.2–14.5)
WBC: 5.5 10*3/uL (ref 3.9–10.3)

## 2017-09-30 LAB — LACTATE DEHYDROGENASE: LDH: 205 U/L (ref 125–245)

## 2017-09-30 LAB — TECHNOLOGIST REVIEW

## 2017-09-30 LAB — FERRITIN: FERRITIN: 51 ng/mL (ref 9–269)

## 2017-10-01 LAB — SEDIMENTATION RATE: SED RATE: 33 mm/h (ref 0–40)

## 2017-10-03 LAB — KAPPA/LAMBDA LIGHT CHAINS
Ig Kappa Free Light Chain: 14.4 mg/L (ref 3.3–19.4)
Ig Lambda Free Light Chain: 9.9 mg/L (ref 5.7–26.3)
KAPPA/LAMBDA FLC RATIO: 1.45 (ref 0.26–1.65)

## 2017-10-04 ENCOUNTER — Telehealth: Payer: Self-pay | Admitting: Hematology and Oncology

## 2017-10-04 ENCOUNTER — Ambulatory Visit (HOSPITAL_BASED_OUTPATIENT_CLINIC_OR_DEPARTMENT_OTHER): Payer: 59 | Admitting: Hematology and Oncology

## 2017-10-04 ENCOUNTER — Encounter: Payer: Self-pay | Admitting: Hematology and Oncology

## 2017-10-04 VITALS — BP 117/84 | HR 108 | Temp 97.7°F | Resp 18 | Ht 66.0 in | Wt 196.8 lb

## 2017-10-04 DIAGNOSIS — Z720 Tobacco use: Secondary | ICD-10-CM | POA: Diagnosis not present

## 2017-10-04 DIAGNOSIS — D472 Monoclonal gammopathy: Secondary | ICD-10-CM

## 2017-10-04 DIAGNOSIS — D649 Anemia, unspecified: Secondary | ICD-10-CM

## 2017-10-04 NOTE — Telephone Encounter (Signed)
Gave avs and calendar for February 2019 °

## 2017-10-05 LAB — MULTIPLE MYELOMA PANEL, SERUM
ALBUMIN SERPL ELPH-MCNC: 3.6 g/dL (ref 2.9–4.4)
ALPHA 1: 0.3 g/dL (ref 0.0–0.4)
ALPHA2 GLOB SERPL ELPH-MCNC: 0.7 g/dL (ref 0.4–1.0)
Albumin/Glob SerPl: 1.1 (ref 0.7–1.7)
B-Globulin SerPl Elph-Mcnc: 1.2 g/dL (ref 0.7–1.3)
Gamma Glob SerPl Elph-Mcnc: 1.2 g/dL (ref 0.4–1.8)
Globulin, Total: 3.4 g/dL (ref 2.2–3.9)
IGM (IMMUNOGLOBIN M), SRM: 157 mg/dL (ref 26–217)
IgA, Qn, Serum: 287 mg/dL (ref 87–352)
TOTAL PROTEIN: 7 g/dL (ref 6.0–8.5)

## 2017-10-12 DIAGNOSIS — M25672 Stiffness of left ankle, not elsewhere classified: Secondary | ICD-10-CM | POA: Diagnosis not present

## 2017-10-13 DIAGNOSIS — J449 Chronic obstructive pulmonary disease, unspecified: Secondary | ICD-10-CM | POA: Diagnosis not present

## 2017-10-18 DIAGNOSIS — M25672 Stiffness of left ankle, not elsewhere classified: Secondary | ICD-10-CM | POA: Diagnosis not present

## 2017-10-18 NOTE — Progress Notes (Signed)
[facility] New Visit:  Assessment: MGUS (monoclonal gammopathy of unknown significance) 56 y.o. female with previous history of elevated IgA without clear evidence of monoclonal gammopathy in the available lab work was lost to follow-up.  Returns to the clinic with new referral for the above-mentioned supposed monoclonal gammopathy as well as a recent anemia and leukocytosis.  At this time, the first step would be to reassess the hematological status and obtain repeat protein values for monoclonal gammopathy monitoring.  Plan: --Labs as outlined below --RTC 1week to review and discuss further need of evaluation Voice recognition software was used and creation of this note. Despite my best effort at editing the text, some misspelling/errors may have occurred.  Orders Placed This Encounter  Procedures  . CBC with Differential    Standing Status:   Future    Number of Occurrences:   1    Standing Expiration Date:   09/29/2018  . Sedimentation rate    Standing Status:   Future    Number of Occurrences:   1    Standing Expiration Date:   09/29/2018  . Comprehensive metabolic panel    Standing Status:   Future    Number of Occurrences:   1    Standing Expiration Date:   09/29/2018  . Lactate dehydrogenase (LDH)    Standing Status:   Future    Number of Occurrences:   1    Standing Expiration Date:   09/29/2018  . Ferritin    Standing Status:   Future    Number of Occurrences:   1    Standing Expiration Date:   09/29/2018  . Iron and TIBC    Standing Status:   Future    Number of Occurrences:   1    Standing Expiration Date:   09/29/2018  . Multiple Myeloma Panel (SPEP&IFE w/QIG)    Standing Status:   Future    Number of Occurrences:   1    Standing Expiration Date:   09/29/2018  . Kappa/lambda light chains    Standing Status:   Future    Number of Occurrences:   1    Standing Expiration Date:   09/29/2018    All questions were answered.  . The  patient knows to call the clinic with any problems, questions or concerns.  This note was electronically signed.    History of Presenting Illness Teresa Roberts 56 y.o. presenting to the [facility] for IgA monoclonal gammopathy of unknown significance and anemia, referred by Dr Jonathon Jordan.  Please see oncological history below for details.  It appears that patient was lost to follow-up, and is now referred back for continued monitoring.  Patient denies any new symptoms since the last visit.  Continues to have muscle and soft tissue pain consistent with her fibromyalgia.  No interval fevers, chills, night sweats.  Denies any changes in appetite or unexpected weight loss.  Oncological/hematological History: **MGUS, IgA --Diagnosed with fibromyalgia in 2013. During her workup, she was found to have abnormal lab work.   --Labs, 12/03/11: SPEP -- no monoclonal protein; IgG 2120, IgA 296, IgM 252.   --Labs, 12/28/11: . WBC 8.9, Hgb 14.3, Plt 235; morphology was essentially unremarkable with normal-appearing white blood cells, red blood cells and platelets; Cr 0.9, Ca 10.6; SPEP -- no M-spike, but the possibility of a faint restricted band could not be completely excluded; IgG 1240, IgA 389, IgM 231. fVIII activity 122%, vWF Ag 139, Ristocetin cofactor 88%(wnl), all multimers  were present in normal amounts with no lab evidence for von Willebrand's disease. --Labs, 08/13/17: WBC 14.9, ANC 13.0, ALC 0.7, Mono 1.2, Hgb 10.2, MCV 72.0, MCH 20.8, RDW 20.6, Plt 113; tProt 5.4, Alb 2.7, Ca 9.7, Cr 0.8, AP 109;   Medical History: Past Medical History:  Diagnosis Date  . COPD (chronic obstructive pulmonary disease) ([place])    O2 dependent  . cpap   . Depression    PTSD  . Diastolic CHF ([place])   . Fibromyalgia   . Hyperlipidemia   . Hypertension   . Migraines     Surgical History: Past Surgical History:  Procedure Laterality Date  . APPENDECTOMY    . CESAREAN SECTION    . ORIF ANKLE FRACTURE Left  08/12/2017   Procedure: OPEN REDUCTION INTERNAL FIXATION (ORIF) ANKLE FRACTURE;  Surgeon: Rod Can, MD;  Location: [facility];  Service: Orthopedics;  Laterality: Left;  . RIGHT/LEFT HEART CATH AND CORONARY ANGIOGRAPHY N/A 01/27/2017   Procedure: Right/Left Heart Cath and Coronary Angiography;  Surgeon: Troy Sine, MD;  Location: [facility];  Service: Cardiovascular;  Laterality: N/A;    Family History: Family History  Problem Relation Age of Onset  . Diabetes Mother   . Hypertension Mother   . CAD Mother   . Diabetes Father   . Lung cancer Father   . Diabetes Sister     Social History: Social History   Socioeconomic History  . Marital status: Married    Spouse name: Not on file  . Number of children: Not on file  . Years of education: Not on file  . Highest education level: Not on file  Social Needs  . Financial resource strain: Not on file  . Food insecurity - worry: Not on file  . Food insecurity - inability: Not on file  . Transportation needs - medical: Not on file  . Transportation needs - non-medical: Not on file  Occupational History  . Not on file  Tobacco Use  . Smoking status: Current Every Day Smoker    Packs/day: 1.00    Years: 39.00    Pack years: 39.00    Types: Cigarettes  . Smokeless tobacco: Current User  . Tobacco comment: down to 0.5ppd 06/22/17  Substance and Sexual Activity  . Alcohol use: Yes    Alcohol/week: 0.6 oz    Types: 1 Glasses of wine per week  . Drug use: No  . Sexual activity: Not on file  Other Topics Concern  . Not on file  Social History Narrative  . Not on file    Allergies: Allergies  Allergen Reactions  . Citrus Hives  . Lisinopril Cough    Medications:  Current Outpatient Medications  Medication Sig Dispense Refill  . Acidophilus Lactobacillus CAPS Take 2 capsules by mouth every morning.    Marland Kitchen albuterol (PROVENTIL HFA) 108 (90 Base) MCG/ACT inhaler Inhale 2 puffs into the lungs 4 (four) times daily as  needed for wheezing or shortness of breath.    . ARIPiprazole (ABILIFY) 10 MG tablet Take 5 mg by mouth every morning.    Marland Kitchen aspirin 325 MG EC tablet Take 1 tablet (325 mg total) by mouth 2 (two) times daily. 60 tablet 0  . Biotin 1 MG CAPS Take 1 mg by mouth daily.    . budesonide-formoterol (SYMBICORT) 160-4.5 MCG/ACT inhaler Inhale 2 puffs into the lungs 2 (two) times daily.    . busPIRone (BUSPAR) 15 MG tablet Take 15 mg by mouth 2 (two)  times daily.    . butalbital-acetaminophen-caffeine (FIORICET, ESGIC) 50-325-40 MG tablet Take 1 tablet by mouth 2 (two) times daily as needed for headache.    . Carboxymethylcellulose Sod PF 0.25 % SOLN Apply 1 drop to eye 2 (two) times daily.    Marland Kitchen docusate sodium (COLACE) 100 MG capsule Take 100 mg by mouth 3 (three) times daily.    . DULoxetine (CYMBALTA) 30 MG capsule TAKE ONE CAPSULE BY MOUTH EVERY DAY (Patient taking differently: take 53m capsule once daily at bedtime) 30 capsule 5  . fluticasone (FLONASE) 50 MCG/ACT nasal spray Place 2 sprays into both nostrils daily. 16 g 5  . gabapentin (NEURONTIN) 800 MG tablet Take 800 mg by mouth 2 (two) times daily.    . hydrochlorothiazide (HYDRODIURIL) 25 MG tablet Take 1 tablet (25 mg total) by mouth daily. 30 tablet 0  . HYDROcodone-acetaminophen (NORCO/VICODIN) 5-325 MG tablet Take 1-2 tablets by mouth every 6 (six) hours as needed for moderate pain (breakthrough pain). 20 tablet 0  . latanoprost (XALATAN) 0.005 % ophthalmic solution Place 1 drop into both eyes at bedtime.    . methocarbamol (ROBAXIN) 500 MG tablet Take 1 tablet (500 mg total) by mouth every 8 (eight) hours as needed for muscle spasms.    .Marland Kitchenomeprazole (PRILOSEC OTC) 20 MG tablet Take 40 mg by mouth daily.    . rizatriptan (MAXALT-MLT) 10 MG disintegrating tablet Take 10 mg by mouth as needed for migraine. May repeat in 2 hours if needed    . simvastatin (ZOCOR) 10 MG tablet Take 10 mg by mouth at bedtime.    . Tiotropium Bromide Monohydrate  (SPIRIVA RESPIMAT) 1.25 MCG/ACT AERS Inhale 2 puffs into the lungs daily.    .Marland Kitchentopiramate (TOPAMAX) 25 MG tablet Take 50 mg by mouth every morning.    . traZODone (DESYREL) 100 MG tablet Take 100 mg by mouth at bedtime as needed for sleep.    . vitamin B-12 (CYANOCOBALAMIN) 100 MCG tablet Take 100 mcg by mouth daily.     No current facility-administered medications for this visit.     Review of Systems: Review of Systems  All other systems reviewed and are negative.    PHYSICAL EXAMINATION Blood pressure 135/79, pulse 94, temperature 98.3 F (36.8 C), temperature source Oral, resp. rate 19, height 5' 6"  (1.676 m), weight 191 lb (86.6 kg), last menstrual period 08/19/2012, SpO2 94 %, peak flow (!) 2 L/min.  ECOG PERFORMANCE STATUS: 1 - Symptomatic but completely ambulatory  Physical Exam  Constitutional: She is oriented to person, place, and time and well-developed, well-nourished, and in no distress. No distress.  HENT:  Head: Normocephalic and atraumatic.  Mouth/Throat: Oropharynx is clear and moist. No oropharyngeal exudate.  Eyes: Conjunctivae and EOM are normal. Pupils are equal, round, and reactive to light. No scleral icterus.  Neck: No thyromegaly present.  Cardiovascular: Normal rate, regular rhythm and normal heart sounds.  No murmur heard. Pulmonary/Chest: Effort normal and breath sounds normal. No respiratory distress. She has no wheezes. She has no rales.  Abdominal: Soft. Bowel sounds are normal. She exhibits no distension and no mass. There is no tenderness. There is no rebound and no guarding.  Musculoskeletal: Normal range of motion. She exhibits no edema.  Lymphadenopathy:    She has no cervical adenopathy.  Neurological: She is alert and oriented to person, place, and time. She has normal reflexes. She displays normal reflexes. No cranial nerve deficit. Coordination normal.  Skin: Skin is warm and dry. No  rash noted. She is not diaphoretic. No erythema. No pallor.      LABORATORY DATA: I have personally reviewed the data as listed: No visits with results within 1 Week(s) from this visit.  Latest known visit with results is:  Admission on 08/09/2017, Discharged on 08/13/2017  No results displayed because visit has over 200 results.           Ardath Sax, MD

## 2017-10-19 DIAGNOSIS — S8262XD Displaced fracture of lateral malleolus of left fibula, subsequent encounter for closed fracture with routine healing: Secondary | ICD-10-CM | POA: Diagnosis not present

## 2017-10-19 DIAGNOSIS — D649 Anemia, unspecified: Secondary | ICD-10-CM | POA: Insufficient documentation

## 2017-10-19 DIAGNOSIS — D509 Iron deficiency anemia, unspecified: Secondary | ICD-10-CM | POA: Insufficient documentation

## 2017-10-19 NOTE — Assessment & Plan Note (Signed)
56 y.o. female with previous history of elevated IgA without clear evidence of monoclonal gammopathy in the available lab work was lost to follow-up.  Returns to the clinic with new referral for the above-mentioned supposed monoclonal gammopathy as well as a recent anemia and leukocytosis.  At this time, the first step would be to reassess the hematological status and obtain repeat protein values for monoclonal gammopathy monitoring.  Plan: --Labs as outlined below --RTC 1week to review and discuss further need of evaluation

## 2017-10-22 NOTE — Assessment & Plan Note (Signed)
56 y.o. female with previous history of elevated IgA without clear evidence of monoclonal gammopathy in the available lab work was lost to follow-up.  Repeat blood work demonstrates a faint monoclonal IgA kappa supporting MGUS diagnosis.  At this time, patient had complete recovery of her hemoglobin levels making active myeloma unlikely based on the remainder of the lab work.  Never the less, patient does need routine follow-up with our clinic to watch for possible future development of IgA multiple myeloma.  Plan: --RTC in 3 months with labs to monitor for possible monoclonal gammopathy progression

## 2017-10-22 NOTE — Progress Notes (Signed)
[facility] Follow-up Visit:  Assessment: MGUS (monoclonal gammopathy of unknown significance) 56 y.o. female with previous history of elevated IgA without clear evidence of monoclonal gammopathy in the available lab work was lost to follow-up.  Repeat blood work demonstrates a faint monoclonal IgA kappa supporting MGUS diagnosis.  At this time, patient had complete recovery of her hemoglobin levels making active myeloma unlikely based on the remainder of the lab work.  Never the less, patient does need routine follow-up with our clinic to watch for possible future development of IgA multiple myeloma.  Plan: --RTC in 3 months with labs to monitor for possible monoclonal gammopathy progression  Voice recognition software was used and creation of this note. Despite my best effort at editing the text, some misspelling/errors may have occurred.  Orders Placed This Encounter  Procedures  . CBC with Differential    Standing Status:   Future    Standing Expiration Date:   10/04/2018  . Comprehensive metabolic panel    Standing Status:   Future    Standing Expiration Date:   10/04/2018  . Multiple Myeloma Panel (SPEP&IFE w/QIG)    Standing Status:   Future    Standing Expiration Date:   10/04/2018   All questions were answered.  . The patient knows to call the clinic with any problems, questions or concerns.  This note was electronically signed.    History of Presenting Illness Teresa Roberts is a 55 y.o. female followed in the [facility] for IgA monoclonal gammopathy of unknown significance and anemia, referred by Dr Jonathon Jordan.  Please see oncological history below for details.  It appears that patient was lost to follow-up, and is now referred back for continued monitoring.  Patient denies any new symptoms since the last visit.  Continues to have muscle and soft tissue pain consistent with her fibromyalgia.  No interval fevers, chills, night sweats.  Denies any  changes in appetite or unexpected weight loss.  Recent returns to the clinic to review findings of recent laboratory testing.  She denies any new complaints since the last visit to the clinic.  Oncological/hematological History: **MGUS, IgA --Diagnosed with fibromyalgia in 2013. During her workup, she was found to have abnormal lab work.              --Labs, 12/03/11: SPEP -- no monoclonal protein; IgG 2120, IgA 296, IgM 252.              --Labs, 12/28/11: . WBC 8.9, Hgb 14.3, Plt 235; morphology was essentially unremarkable with normal-appearing white blood cells, red blood cells and platelets; Cr 0.9, Ca 10.6; SPEP -- no M-spike, but the possibility of a faint restricted band could not be completely excluded; IgG 1240, IgA 389, IgM 231. fVIII activity 122%, vWF Ag 139, Ristocetin cofactor 88%(wnl), all multimers were present in normal amounts with no lab evidence for von Willebrand's disease. --Labs, 08/13/17: WBC 14.9, Hgb 10.2, MCV 72.0, MCH 20.8, RDW 20.6, Plt 113; tProt 5.4, Alb 2.7, Ca   9.7, Cr 0.8, AP 109;  --Labs, 09/30/17: WBC   5.5, Hgb 13.6, MCV 76.8, MCH 23.3, RDW 29.2, Plt 177; tProt 7.6, Alb 3.6, Ca 10.4, Cr 0.8, AP 161; SPEP -- M-Spike 0, SIFE --IgA kappa monoclonal protein present; IgG 1096, IgA 287, IgM 157; kappa 14.4, lambda 9.9, KLR 1.45  LDH 205; Fe 69, FeSat 17%, TIBC 411, Ferritin 51;    Medical History: Past Medical History:  Diagnosis Date  . COPD (chronic obstructive  pulmonary disease) ([place])    O2 dependent  . cpap   . Depression    PTSD  . Diastolic CHF ([place])   . Fibromyalgia   . Hyperlipidemia   . Hypertension   . Migraines     Surgical History: Past Surgical History:  Procedure Laterality Date  . APPENDECTOMY    . CESAREAN SECTION    . ORIF ANKLE FRACTURE Left 08/12/2017   Procedure: OPEN REDUCTION INTERNAL FIXATION (ORIF) ANKLE FRACTURE;  Surgeon: Rod Can, MD;  Location: [facility];  Service: Orthopedics;  Laterality: Left;  . RIGHT/LEFT HEART  CATH AND CORONARY ANGIOGRAPHY N/A 01/27/2017   Procedure: Right/Left Heart Cath and Coronary Angiography;  Surgeon: Troy Sine, MD;  Location: [facility];  Service: Cardiovascular;  Laterality: N/A;    Family History: Family History  Problem Relation Age of Onset  . Diabetes Mother   . Hypertension Mother   . CAD Mother   . Diabetes Father   . Lung cancer Father   . Diabetes Sister     Social History: Social History   Socioeconomic History  . Marital status: Married    Spouse name: Not on file  . Number of children: Not on file  . Years of education: Not on file  . Highest education level: Not on file  Social Needs  . Financial resource strain: Not on file  . Food insecurity - worry: Not on file  . Food insecurity - inability: Not on file  . Transportation needs - medical: Not on file  . Transportation needs - non-medical: Not on file  Occupational History  . Not on file  Tobacco Use  . Smoking status: Current Every Day Smoker    Packs/day: 1.00    Years: 39.00    Pack years: 39.00    Types: Cigarettes  . Smokeless tobacco: Current User  . Tobacco comment: down to 0.5ppd 06/22/17  Substance and Sexual Activity  . Alcohol use: Yes    Alcohol/week: 0.6 oz    Types: 1 Glasses of wine per week  . Drug use: No  . Sexual activity: Not on file  Other Topics Concern  . Not on file  Social History Narrative  . Not on file    Allergies: Allergies  Allergen Reactions  . Citrus Hives  . Lisinopril Cough    Medications:  Current Outpatient Medications  Medication Sig Dispense Refill  . Acidophilus Lactobacillus CAPS Take 2 capsules by mouth every morning.    Marland Kitchen albuterol (PROVENTIL HFA) 108 (90 Base) MCG/ACT inhaler Inhale 2 puffs into the lungs 4 (four) times daily as needed for wheezing or shortness of breath.    . ARIPiprazole (ABILIFY) 10 MG tablet Take 5 mg by mouth every morning.    Marland Kitchen aspirin 325 MG EC tablet Take 1 tablet (325 mg total) by mouth 2  (two) times daily. 60 tablet 0  . Biotin 1 MG CAPS Take 1 mg by mouth daily.    . budesonide-formoterol (SYMBICORT) 160-4.5 MCG/ACT inhaler Inhale 2 puffs into the lungs 2 (two) times daily.    . busPIRone (BUSPAR) 15 MG tablet Take 15 mg by mouth 2 (two) times daily.    . butalbital-acetaminophen-caffeine (FIORICET, ESGIC) 50-325-40 MG tablet Take 1 tablet by mouth 2 (two) times daily as needed for headache.    . Carboxymethylcellulose Sod PF 0.25 % SOLN Apply 1 drop to eye 2 (two) times daily.    Marland Kitchen docusate sodium (COLACE) 100 MG capsule Take 100 mg by  mouth 3 (three) times daily.    . DULoxetine (CYMBALTA) 30 MG capsule TAKE ONE CAPSULE BY MOUTH EVERY DAY (Patient taking differently: take 60m capsule once daily at bedtime) 30 capsule 5  . fluticasone (FLONASE) 50 MCG/ACT nasal spray Place 2 sprays into both nostrils daily. 16 g 5  . gabapentin (NEURONTIN) 800 MG tablet Take 800 mg by mouth 2 (two) times daily.    . hydrochlorothiazide (HYDRODIURIL) 25 MG tablet Take 1 tablet (25 mg total) by mouth daily. 30 tablet 0  . HYDROcodone-acetaminophen (NORCO/VICODIN) 5-325 MG tablet Take 1-2 tablets by mouth every 6 (six) hours as needed for moderate pain (breakthrough pain). 20 tablet 0  . latanoprost (XALATAN) 0.005 % ophthalmic solution Place 1 drop into both eyes at bedtime.    . methocarbamol (ROBAXIN) 500 MG tablet Take 1 tablet (500 mg total) by mouth every 8 (eight) hours as needed for muscle spasms.    .Marland Kitchenomeprazole (PRILOSEC OTC) 20 MG tablet Take 40 mg by mouth daily.    . rizatriptan (MAXALT-MLT) 10 MG disintegrating tablet Take 10 mg by mouth as needed for migraine. May repeat in 2 hours if needed    . simvastatin (ZOCOR) 10 MG tablet Take 10 mg by mouth at bedtime.    . Tiotropium Bromide Monohydrate (SPIRIVA RESPIMAT) 1.25 MCG/ACT AERS Inhale 2 puffs into the lungs daily.    .Marland Kitchentopiramate (TOPAMAX) 25 MG tablet Take 50 mg by mouth every morning.    . traZODone (DESYREL) 100 MG tablet  Take 100 mg by mouth at bedtime as needed for sleep.    . vitamin B-12 (CYANOCOBALAMIN) 100 MCG tablet Take 100 mcg by mouth daily.     No current facility-administered medications for this visit.     Review of Systems: Review of Systems  All other systems reviewed and are negative.    PHYSICAL EXAMINATION Blood pressure 117/84, pulse (!) 108, temperature 97.7 F (36.5 C), temperature source Oral, resp. rate 18, height 5' 6"  (1.676 m), weight 196 lb 12.8 oz (89.3 kg), last menstrual period 08/19/2012.  ECOG PERFORMANCE STATUS: 1 - Symptomatic but completely ambulatory  Physical Exam  Constitutional: She is oriented to person, place, and time and well-developed, well-nourished, and in no distress. No distress.  HENT:  Head: Normocephalic and atraumatic.  Mouth/Throat: Oropharynx is clear and moist. No oropharyngeal exudate.  Eyes: Conjunctivae and EOM are normal. Pupils are equal, round, and reactive to light. No scleral icterus.  Neck: No thyromegaly present.  Cardiovascular: Normal rate, regular rhythm and normal heart sounds.  No murmur heard. Pulmonary/Chest: Effort normal and breath sounds normal. No respiratory distress. She has no wheezes. She has no rales.  Abdominal: Soft. Bowel sounds are normal. She exhibits no distension and no mass. There is no tenderness. There is no rebound and no guarding.  Musculoskeletal: Normal range of motion. She exhibits no edema.  Lymphadenopathy:    She has no cervical adenopathy.  Neurological: She is alert and oriented to person, place, and time. She has normal reflexes. No cranial nerve deficit. Coordination normal.  Skin: Skin is warm and dry. No rash noted. She is not diaphoretic. No erythema. No pallor.     LABORATORY DATA: I have personally reviewed the data as listed: Appointment on 09/30/2017  Component Date Value Ref Range Status  . WBC 09/30/2017 5.5  3.9 - 10.3 10e3/uL Final  . NEUT# 09/30/2017 3.5  1.5 - 6.5 10e3/uL Final   . HGB 09/30/2017 13.6  11.6 - 15.9 g/dL  Final  . HCT 09/30/2017 44.8  34.8 - 46.6 % Final  . Platelets 09/30/2017 177  145 - 400 10e3/uL Final  . MCV 09/30/2017 76.8* 79.5 - 101.0 fL Final  . MCH 09/30/2017 23.3* 25.1 - 34.0 pg Final  . MCHC 09/30/2017 30.4* 31.5 - 36.0 g/dL Final  . RBC 09/30/2017 5.83* 3.70 - 5.45 10e6/uL Final  . RDW 09/30/2017 29.2* 11.2 - 14.5 % Final  . lymph# 09/30/2017 1.3  0.9 - 3.3 10e3/uL Final  . MONO# 09/30/2017 0.4  0.1 - 0.9 10e3/uL Final  . Eosinophils Absolute 09/30/2017 0.2  0.0 - 0.5 10e3/uL Final  . Basophils Absolute 09/30/2017 0.1  0.0 - 0.1 10e3/uL Final  . NEUT% 09/30/2017 63.7  38.4 - 76.8 % Final  . LYMPH% 09/30/2017 24.3  14.0 - 49.7 % Final  . MONO% 09/30/2017 7.0  0.0 - 14.0 % Final  . EOS% 09/30/2017 3.6  0.0 - 7.0 % Final  . BASO% 09/30/2017 1.4  0.0 - 2.0 % Final  . Sodium 09/30/2017 141  136 - 145 mEq/L Final  . Potassium 09/30/2017 3.7  3.5 - 5.1 mEq/L Final  . Chloride 09/30/2017 108  98 - 109 mEq/L Final  . CO2 09/30/2017 24  22 - 29 mEq/L Final  . Glucose 09/30/2017 96  70 - 140 mg/dl Final   Glucose reference range is for nonfasting patients. Fasting glucose reference range is 70- 100.  Marland Kitchen BUN 09/30/2017 8.8  7.0 - 26.0 mg/dL Final  . Creatinine 09/30/2017 0.8  0.6 - 1.1 mg/dL Final  . Total Bilirubin 09/30/2017 0.29  0.20 - 1.20 mg/dL Final  . Alkaline Phosphatase 09/30/2017 161* 40 - 150 U/L Final  . AST 09/30/2017 22  5 - 34 U/L Final  . ALT 09/30/2017 32  0 - 55 U/L Final  . Total Protein 09/30/2017 7.6  6.4 - 8.3 g/dL Final  . Albumin 09/30/2017 3.6  3.5 - 5.0 g/dL Final  . Calcium 09/30/2017 10.4  8.4 - 10.4 mg/dL Final  . Anion Gap 09/30/2017 10  3 - 11 mEq/L Final  . EGFR 09/30/2017 >60  >60 ml/min/1.73 m2 Final   eGFR is calculated using the CKD-EPI Creatinine Equation (2009)  . Sedimentation Rate-Westergren 09/30/2017 33  0 - 40 mm/hr Final  . LDH 09/30/2017 205  125 - 245 U/L Final  . Ferritin 09/30/2017 51  9 -  269 ng/ml Final  . Iron 09/30/2017 69  41 - 142 ug/dL Final  . TIBC 09/30/2017 411  236 - 444 ug/dL Final  . UIBC 09/30/2017 343  120 - 384 ug/dL Final  . %SAT 09/30/2017 17* 21 - 57 % Final  . IgG, Qn, Serum 09/30/2017 1,096  700 - 1600 mg/dL Final  . IgA, Qn, Serum 09/30/2017 287  87 - 352 mg/dL Final  . IgM, Qn, Serum 09/30/2017 157  26 - 217 mg/dL Final  . Total Protein 09/30/2017 7.0  6.0 - 8.5 g/dL Final  . Albumin SerPl Elph-Mcnc 09/30/2017 3.6  2.9 - 4.4 g/dL Final  . Alpha 1 09/30/2017 0.3  0.0 - 0.4 g/dL Final  . Alpha2 Glob SerPl Elph-Mcnc 09/30/2017 0.7  0.4 - 1.0 g/dL Final  . B-Globulin SerPl Elph-Mcnc 09/30/2017 1.2  0.7 - 1.3 g/dL Final  . Gamma Glob SerPl Elph-Mcnc 09/30/2017 1.2  0.4 - 1.8 g/dL Final  . M Protein SerPl Elph-Mcnc 09/30/2017 Not Observed  Not Observed g/dL Final  . Globulin, Total 09/30/2017 3.4  2.2 - 3.9 g/dL Final  .  Albumin/Glob SerPl 09/30/2017 1.1  0.7 - 1.7 Final  . IFE 1 09/30/2017 Comment   Final   Comment: Immunofixation shows IgA monoclonal protein with kappa light chain specificity.   . Please Note 09/30/2017 Comment   Final   Comment: Protein electrophoresis scan will follow via computer, mail, or courier delivery.   Loletha Grayer Kappa Free Light Chain 09/30/2017 14.4  3.3 - 19.4 mg/L Final  . Ig Lambda Free Light Chain 09/30/2017 9.9  5.7 - 26.3 mg/L Final  . Kappa/Lambda FluidC Ratio 09/30/2017 1.45  0.26 - 1.65 Final  . Technologist Review 09/30/2017 Mod target, sl poly   Final       Ardath Sax, MD

## 2017-10-24 ENCOUNTER — Ambulatory Visit: Payer: 59 | Admitting: Adult Health

## 2017-10-24 ENCOUNTER — Other Ambulatory Visit: Payer: Self-pay | Admitting: Rheumatology

## 2017-10-27 ENCOUNTER — Ambulatory Visit: Payer: 59 | Admitting: Adult Health

## 2017-10-27 NOTE — Telephone Encounter (Signed)
Last Visit: 01/31/17 Next visit: 11/23/17

## 2017-10-27 NOTE — Telephone Encounter (Signed)
Okay to refill per Hazel Sams, PA-C

## 2017-11-03 DIAGNOSIS — M25672 Stiffness of left ankle, not elsewhere classified: Secondary | ICD-10-CM | POA: Diagnosis not present

## 2017-11-11 NOTE — Progress Notes (Signed)
Office Visit Note  Patient: Teresa Roberts             Date of Birth: [birth year]           MRN: [id number]             PCP: Jonathon Jordan, MD Referring: Jonathon Jordan, MD Visit Date: 11/23/2017 Occupation: @GUAROCC @    Subjective:  Fibromyalgia (increased pain and fatigue )   History of Present Illness: Teresa Roberts is a 56 y.o. female with history of fibromyalgia syndrome. She states she's been experiencing increased pain all over. Increase activity causes increased pain. She feels very tired. She is also gain some weight. She continues to have pain in her trochanteric area. She still has insomnia and fatigue. She's been doing water aerobics and regular exercise.  Activities of Daily Living:  Patient reports morning stiffness for 2 hours.   Patient Reports nocturnal pain.  Difficulty dressing/grooming: Denies Difficulty climbing stairs: Reports Difficulty getting out of chair: Reports Difficulty using hands for taps, buttons, cutlery, and/or writing: Reports   Review of Systems  Constitutional: Positive for fatigue. Negative for night sweats, weight gain, weight loss and weakness.  HENT: Positive for mouth dryness. Negative for mouth sores, trouble swallowing, trouble swallowing and nose dryness.   Eyes: Negative for pain, redness, visual disturbance and dryness.  Respiratory: Negative for cough, shortness of breath and difficulty breathing.   Cardiovascular: Negative for chest pain, palpitations, hypertension, irregular heartbeat and swelling in legs/feet.  Gastrointestinal: Negative for blood in stool, constipation and diarrhea.  Endocrine: Negative for increased urination.  Genitourinary: Negative for vaginal dryness.  Musculoskeletal: Positive for arthralgias, joint pain, myalgias, morning stiffness and myalgias. Negative for joint swelling, muscle weakness and muscle tenderness.  Skin: Negative for color change, rash, hair loss, skin tightness, ulcers and sensitivity to  sunlight.  Allergic/Immunologic: Negative for susceptible to infections.  Neurological: Negative for dizziness, memory loss and night sweats.  Hematological: Negative for swollen glands.  Psychiatric/Behavioral: Positive for depressed mood and sleep disturbance. The patient is nervous/anxious.     PMFS History:  Patient Active Problem List   Diagnosis Date Noted  . Bimalleolar fracture of left ankle 08/12/2017  . Acute on chronic respiratory failure with hypoxia ([place]) 08/09/2017  . Acute renal failure (ARF) ([place]) 08/09/2017  . Acute on chronic diastolic CHF (congestive heart failure) ([place]) 08/09/2017  . Acute metabolic encephalopathy 62/70/3500  . Other fatigue 06/13/2017  . Primary insomnia 06/13/2017  . History of migraine 06/13/2017  . COPD (chronic obstructive pulmonary disease) ([place]) 02/15/2017  . Tobacco abuse 02/15/2017  . Respiratory failure with hypoxia ([place]) 01/28/2017  . Migraine 01/28/2017  . Daily headache   . Hypoxia   . Shortness of breath 01/26/2017  . Non-ST elevation myocardial infarction (NSTEMI), type 2 01/26/2017  . MGUS (monoclonal gammopathy of unknown significance) 01/11/2012  . Fibromyalgia 01/11/2012  . Bruises easily 01/11/2012    Past Medical History:  Diagnosis Date  . COPD (chronic obstructive pulmonary disease) ([place])    O2 dependent  . cpap   . Depression    PTSD  . Diastolic CHF ([place])   . Fibromyalgia   . Hyperlipidemia   . Hypertension   . Migraines     Family History  Problem Relation Age of Onset  . Diabetes Mother   . Hypertension Mother   . CAD Mother   . Diabetes Father   . Lung cancer Father   . Diabetes Sister    Past Surgical History:  Procedure  Laterality Date  . APPENDECTOMY    . CESAREAN SECTION    . ORIF ANKLE FRACTURE Left 08/12/2017   Procedure: OPEN REDUCTION INTERNAL FIXATION (ORIF) ANKLE FRACTURE;  Surgeon: Rod Can, MD;  Location: [facility];  Service: Orthopedics;  Laterality: Left;  . RIGHT/LEFT HEART CATH  AND CORONARY ANGIOGRAPHY N/A 01/27/2017   Procedure: Right/Left Heart Cath and Coronary Angiography;  Surgeon: Troy Sine, MD;  Location: [facility];  Service: Cardiovascular;  Laterality: N/A;   Social History   Social History Narrative  . Not on file     Objective: Vital Signs: BP 106/75 (BP Location: Left Arm, Patient Position: Sitting, Cuff Size: Normal)   Pulse 87   Resp 17   Ht 5\' 4"  (1.626 m)   Wt 203 lb (92.1 kg)   LMP 08/19/2012   BMI 34.84 kg/m    Physical Exam  Constitutional: She is oriented to person, place, and time. She appears well-developed and well-nourished.  HENT:  Head: Normocephalic and atraumatic.  Eyes: Conjunctivae and EOM are normal.  Neck: Normal range of motion.  Cardiovascular: Normal rate, regular rhythm, normal heart sounds and intact distal pulses.  Pulmonary/Chest: Effort normal and breath sounds normal.  Abdominal: Soft. Bowel sounds are normal.  Lymphadenopathy:    She has no cervical adenopathy.  Neurological: She is alert and oriented to person, place, and time.  Skin: Skin is warm and dry. Capillary refill takes less than 2 seconds.  Psychiatric: She has a normal mood and affect. Her behavior is normal.  Nursing note and vitals reviewed.    Musculoskeletal Exam: C-spine and thoracic lumbar spine was good range of motion. She had bilateral trapezius is spasm. Shoulder joints elbow joints wrist joint MCPs PIPs DIPs with good range of motion with no synovitis. Hip joints knee joints ankles MTPs PIPs DIPs are good range of motion with no synovitis. Fibromyalgia tender points were 16 out of 18 positive is generalize hyperalgesia.  CDAI Exam: No CDAI exam completed.    Investigation: No additional findings. CBC Latest Ref Rng & Units 09/30/2017 08/13/2017 08/12/2017  WBC 3.9 - 10.3 10e3/uL 5.5 14.9(H) 13.4(H)  Hemoglobin 11.6 - 15.9 g/dL 13.6 10.2(L) 10.7(L)  Hematocrit 34.8 - 46.6 % 44.8 35.3(L) 35.7(L)  Platelets 145 - 400  10e3/uL 177 113(L) 122(L)   CMP Latest Ref Rng & Units 09/30/2017 09/30/2017 08/13/2017  Glucose 70 - 140 mg/dl 96 - 194(H)  BUN 7.0 - 26.0 mg/dL 8.8 - 18  Creatinine 0.6 - 1.1 mg/dL 0.8 - 0.83  Sodium 136 - 145 mEq/L 141 - 137  Potassium 3.5 - 5.1 mEq/L 3.7 - 4.0  Chloride 101 - 111 mmol/L - - 103  CO2 22 - 29 mEq/L 24 - 26  Calcium 8.4 - 10.4 mg/dL 10.4 - 9.7  Total Protein 6.0 - 8.5 g/dL 7.6 7.0 5.4(L)  Total Bilirubin 0.20 - 1.20 mg/dL 0.29 - 1.0  Alkaline Phos 40 - 150 U/L 161(H) - 109  AST 5 - 34 U/L 22 - 68(H)  ALT 0 - 55 U/L 32 - 603(H)    Imaging: No results found.  Speciality Comments: No specialty comments available.    Procedures:  Trigger Point Inj Date/Time: 11/23/2017 10:39 AM Performed by: Bo Merino, MD Authorized by: Bo Merino, MD   Consent Given by:  Patient Site marked: the procedure site was marked   Timeout: prior to procedure the correct patient, procedure, and site was verified   Indications:  Muscle spasm and pain Total #  of Trigger Points:  2 Location: neck   Needle Size:  27 G Approach:  Dorsal Medications #1:  0.5 mL lidocaine 1 %; 10 mg triamcinolone acetonide 40 MG/ML Medications #2:  0.5 mL lidocaine 1 %; 10 mg triamcinolone acetonide 40 MG/ML Patient tolerance:  Patient tolerated the procedure well with no immediate complications   Allergies: Citrus and Lisinopril   Assessment / Plan:     Visit Diagnoses: Fibromyalgia - patient complains of increased pain and discomfort. She states she's been having generalized pain and increased pain after increased activity. She is on Cymbalta 60 mg a day, methocarbamol 500 mg by mouth 3 times a day, gabapentin 800 mg by mouth twice a day. I discussed physical therapy which she would like to wait at this time.  Neck pain: She has bilateral trapezius is spasm. Per patient's request informed consent was obtained bilateral trapezius area were prepped and injected with cortisone which she  tolerated well.  Trochanteric bursitis of left hip: ITB and exercise were demonstrated and discussed.  Other fatigue: She's been experiencing increased fatigue.  Primary insomnia - On trazodone 100 mg at bedtime. Patient states not very effective.  Obesity: Patient reports increased weight gain recently. Weight loss diet and exercise was discussed.  Other medical problems are listed as follows:  MGUS (monoclonal gammopathy of unknown significance)  History of migraine - On Topamax when necessary  History of COPD  Tobacco abuse    Orders: Orders Placed This Encounter  Procedures  . Trigger Point Inj   No orders of the defined types were placed in this encounter.   Face-to-face time spent with patient was 30 minutes. Greater than 50% of time was spent in counseling and coordination of care.  Follow-Up Instructions: Return in about 6 months (around 05/23/2018) for FMS.   Bo Merino, MD  Note - This record has been created using Editor, commissioning.  Chart creation errors have been sought, but may not always  have been located. Such creation errors do not reflect on  the standard of medical care.

## 2017-11-12 DIAGNOSIS — J449 Chronic obstructive pulmonary disease, unspecified: Secondary | ICD-10-CM | POA: Diagnosis not present

## 2017-11-23 ENCOUNTER — Ambulatory Visit: Payer: Commercial Managed Care - HMO | Admitting: Rheumatology

## 2017-11-23 ENCOUNTER — Encounter: Payer: Self-pay | Admitting: Rheumatology

## 2017-11-23 VITALS — BP 106/75 | HR 87 | Resp 17 | Ht 64.0 in | Wt 203.0 lb

## 2017-11-23 DIAGNOSIS — F5101 Primary insomnia: Secondary | ICD-10-CM

## 2017-11-23 DIAGNOSIS — R5383 Other fatigue: Secondary | ICD-10-CM | POA: Diagnosis not present

## 2017-11-23 DIAGNOSIS — D472 Monoclonal gammopathy: Secondary | ICD-10-CM

## 2017-11-23 DIAGNOSIS — M797 Fibromyalgia: Secondary | ICD-10-CM

## 2017-11-23 DIAGNOSIS — M7062 Trochanteric bursitis, left hip: Secondary | ICD-10-CM

## 2017-11-23 DIAGNOSIS — E6609 Other obesity due to excess calories: Secondary | ICD-10-CM

## 2017-11-23 DIAGNOSIS — Z8709 Personal history of other diseases of the respiratory system: Secondary | ICD-10-CM

## 2017-11-23 DIAGNOSIS — Z720 Tobacco use: Secondary | ICD-10-CM

## 2017-11-23 DIAGNOSIS — M542 Cervicalgia: Secondary | ICD-10-CM

## 2017-11-23 DIAGNOSIS — Z6834 Body mass index (BMI) 34.0-34.9, adult: Secondary | ICD-10-CM

## 2017-11-23 DIAGNOSIS — Z8669 Personal history of other diseases of the nervous system and sense organs: Secondary | ICD-10-CM

## 2017-11-23 MED ORDER — TRIAMCINOLONE ACETONIDE 40 MG/ML IJ SUSP
10.0000 mg | INTRAMUSCULAR | Status: AC | PRN
  Administered 2017-11-23: 10 mg via INTRAMUSCULAR

## 2017-11-23 MED ORDER — LIDOCAINE HCL 1 % IJ SOLN
0.5000 mL | INTRAMUSCULAR | Status: AC | PRN
  Administered 2017-11-23: .5 mL

## 2017-12-02 DIAGNOSIS — D472 Monoclonal gammopathy: Secondary | ICD-10-CM | POA: Diagnosis not present

## 2017-12-02 DIAGNOSIS — G4730 Sleep apnea, unspecified: Secondary | ICD-10-CM | POA: Diagnosis not present

## 2017-12-02 DIAGNOSIS — Z713 Dietary counseling and surveillance: Secondary | ICD-10-CM | POA: Diagnosis not present

## 2017-12-13 DIAGNOSIS — J449 Chronic obstructive pulmonary disease, unspecified: Secondary | ICD-10-CM | POA: Diagnosis not present

## 2017-12-30 ENCOUNTER — Other Ambulatory Visit: Payer: 59

## 2018-01-04 ENCOUNTER — Ambulatory Visit: Payer: 59 | Admitting: Hematology and Oncology

## 2018-01-12 ENCOUNTER — Telehealth: Payer: Self-pay

## 2018-01-12 ENCOUNTER — Ambulatory Visit: Payer: 59 | Admitting: Hematology and Oncology

## 2018-01-12 NOTE — Telephone Encounter (Signed)
A call was made to the patient in regard to her missed appointment at 10:00.  A message was left for patient to call back and reschedule her appointment.

## 2018-01-25 ENCOUNTER — Telehealth: Payer: Self-pay | Admitting: Pulmonary Disease

## 2018-01-25 NOTE — Telephone Encounter (Signed)
Spoke with pt, she is requesting an order for a POC. She wants to send the RX to [facility]. Please fax to them when approved by RA. [phone]. Ok to place order?

## 2018-01-25 NOTE — Telephone Encounter (Signed)
She was last seen 06/2017. Schedule office visit please with NP/me

## 2018-01-26 NOTE — Telephone Encounter (Signed)
Spoke with patient. She is willing to come in and see RA. Will schedule her for 3/18 at 10am. Patient verbalized understanding. Nothing else needed at time of call.

## 2018-01-30 ENCOUNTER — Encounter: Payer: Self-pay | Admitting: Pulmonary Disease

## 2018-01-30 ENCOUNTER — Ambulatory Visit (INDEPENDENT_AMBULATORY_CARE_PROVIDER_SITE_OTHER): Payer: Medicare Other | Admitting: Pulmonary Disease

## 2018-01-30 VITALS — BP 120/84 | HR 89 | Ht 64.0 in | Wt 190.0 lb

## 2018-01-30 DIAGNOSIS — Z720 Tobacco use: Secondary | ICD-10-CM

## 2018-01-30 DIAGNOSIS — J9611 Chronic respiratory failure with hypoxia: Secondary | ICD-10-CM

## 2018-01-30 DIAGNOSIS — J432 Centrilobular emphysema: Secondary | ICD-10-CM | POA: Diagnosis not present

## 2018-01-30 NOTE — Assessment & Plan Note (Addendum)
Smoking cessation again emphasized  She continues to smoke about half pack per day, prior efforts to quit have not been successful including using Nicotrol Inhaler.  She does not want to try Chantix

## 2018-01-30 NOTE — Progress Notes (Signed)
   Subjective:    Patient ID: Teresa Roberts, female    DOB: [birth year], 57 y.o.   MRN: [id number]  HPI  57 year old heavy smoker for FU of COPD and pulmonary hypertension. She was hospitalized 01/2017 where she presented with headaches and hypoxia with saturation in the 60s on walking to the bathroom.  She was discharged on 3 L of oxygen  She smoked > 30-pack-years  She was last hospitalized 07/2017 for acute hypercarbic respiratory failure, presumed sepsis due to UTI, had a fall and broke her ankle which was fixed. Since then she has been maintained on 3 L continuous oxygen with which she has been compliant.  Now she requests a portable concentrator such as Inogen.  She is now on Medicare.  She initially obtained her oxygen from the [place]. She is compliant with Symbicort and Spiriva.  She continues to smoke about half pack per day, prior efforts to quit have not been successful including using Nicotrol Inhaler.  She does not want to try Chantix Attributes it to stress and depression in people around her  Significant tests/ events reviewed  CT angiogram 01/2017  neg for pulmonary embolism .  ABG showed mild hypercarbia with 7.36/45/62 and 7.33/51/70 .  LHC  showed no evidence of coronary artery disease and normal LV function.  RHC showed RV pressures of 55/14 and LVEDP was 18. PVR not documented  Spirometry 02/2017  ratio of 69, FEV1 of 1.15-52% and FVC of 60%   Past Medical History:  Diagnosis Date  . COPD (chronic obstructive pulmonary disease) ([place])    O2 dependent  . cpap   . Depression    PTSD  . Diastolic CHF ([place])   . Fibromyalgia   . Hyperlipidemia   . Hypertension   . Migraines     Review of Systems neg for any significant sore throat, dysphagia, itching, sneezing, nasal congestion or excess/ purulent secretions, fever, chills, sweats, unintended wt loss, pleuritic or exertional cp, hempoptysis, orthopnea pnd or change in chronic leg swelling. Also denies presyncope,  palpitations, heartburn, abdominal pain, nausea, vomiting, diarrhea or change in bowel or urinary habits, dysuria,hematuria, rash, arthralgias, visual complaints, headache, numbness weakness or ataxia.     Objective:   Physical Exam   Gen. Pleasant, well-nourished, in no distress ENT - no thrush, no post nasal drip Neck: No JVD, no thyromegaly, no carotid bruits Lungs: no use of accessory muscles, no dullness to percussion, decreased without rales or rhonchi  Cardiovascular: Rhythm regular, heart sounds  normal, no murmurs or gallops, no peripheral edema Musculoskeletal: No deformities, no cyanosis or clubbing         Assessment & Plan:

## 2018-01-30 NOTE — Assessment & Plan Note (Signed)
We will evaluate you for portable concentrator and send in prescription.  If she needs 2 L continuous, may be difficult to get her POC

## 2018-01-30 NOTE — Patient Instructions (Addendum)
  You have to quit smoking!  This will make you live longer.  We will evaluate you for portable concentrator and send in prescription.  Referral to pulmonary rehab.  Continue on Symbicort and Spiriva

## 2018-01-30 NOTE — Assessment & Plan Note (Signed)
  Referral to pulmonary rehab.  Continue on Symbicort and Spiriva

## 2018-01-31 ENCOUNTER — Telehealth (HOSPITAL_COMMUNITY): Payer: Self-pay

## 2018-01-31 NOTE — Telephone Encounter (Signed)
Patient returned phone call in regards to PR - Patient is interested in the program. Scheduled orientation on 02/20/2018 at 9:15am. Patient will attend the 1:30pm exc class. Went over insurance with patient and patient verbally stated she understands what is responsible for. Mailed packet.

## 2018-01-31 NOTE — Telephone Encounter (Signed)
Patients insurance is active and benefits verified through Medicare Part A/B - 20% co-insurance.  Patients insurance is active and benefits verified through [facility] - $17.00 co-pay, no deductible, out of pocket amount of $500.00/$34.00 has been met, no co-insurance, and no pre-authorization is required. Spoke with [facility] reference [phone]

## 2018-01-31 NOTE — Telephone Encounter (Signed)
Attempted to call patient in regards to PR - lm on vm

## 2018-02-01 ENCOUNTER — Telehealth: Payer: Self-pay | Admitting: Pulmonary Disease

## 2018-02-01 NOTE — Telephone Encounter (Signed)
Order reprinted and re faxed to number requested

## 2018-02-06 DIAGNOSIS — G43719 Chronic migraine without aura, intractable, without status migrainosus: Secondary | ICD-10-CM | POA: Diagnosis not present

## 2018-02-10 DIAGNOSIS — J449 Chronic obstructive pulmonary disease, unspecified: Secondary | ICD-10-CM | POA: Diagnosis not present

## 2018-02-20 ENCOUNTER — Ambulatory Visit (HOSPITAL_COMMUNITY): Payer: Medicare Other

## 2018-02-21 ENCOUNTER — Other Ambulatory Visit: Payer: Self-pay | Admitting: Rheumatology

## 2018-02-21 ENCOUNTER — Telehealth (HOSPITAL_COMMUNITY): Payer: Self-pay

## 2018-02-21 NOTE — Telephone Encounter (Signed)
Attempted to call patient to reschedule orientation for PR - Lm on vm

## 2018-02-21 NOTE — Telephone Encounter (Signed)
Last Visit: 11/23/17 Next Visit: 05/30/18  Okay to refill per Dr. Estanislado Pandy

## 2018-02-22 ENCOUNTER — Other Ambulatory Visit: Payer: Self-pay | Admitting: Rheumatology

## 2018-02-22 NOTE — Telephone Encounter (Signed)
Last Visit: 11/23/17 Next Visit: 05/30/18  Okay to refill Skelaxin?

## 2018-02-22 NOTE — Telephone Encounter (Signed)
ok 

## 2018-02-23 ENCOUNTER — Telehealth (HOSPITAL_COMMUNITY): Payer: Self-pay

## 2018-02-23 NOTE — Telephone Encounter (Signed)
2nd attempt to call patient to reschedule orientation for PR - lm on vm. Sending letter.

## 2018-02-28 ENCOUNTER — Telehealth (HOSPITAL_COMMUNITY): Payer: Self-pay

## 2018-02-28 NOTE — Telephone Encounter (Signed)
3rd attempt to call patient to reschedule orientation - lm on vm

## 2018-03-13 DIAGNOSIS — J449 Chronic obstructive pulmonary disease, unspecified: Secondary | ICD-10-CM | POA: Diagnosis not present

## 2018-03-22 ENCOUNTER — Other Ambulatory Visit: Payer: Self-pay | Admitting: Rheumatology

## 2018-03-22 NOTE — Telephone Encounter (Signed)
Last Visit: 11/23/17 Next Visit: 05/30/18  Okay to refill per Dr. Estanislado Pandy

## 2018-04-12 DIAGNOSIS — J449 Chronic obstructive pulmonary disease, unspecified: Secondary | ICD-10-CM | POA: Diagnosis not present

## 2018-04-22 ENCOUNTER — Other Ambulatory Visit: Payer: Self-pay | Admitting: Rheumatology

## 2018-04-24 ENCOUNTER — Other Ambulatory Visit: Payer: Self-pay | Admitting: Rheumatology

## 2018-04-24 NOTE — Telephone Encounter (Signed)
Last Visit: 11/23/17 Next Visit: 05/30/18  Okay to refill per Dr. Estanislado Pandy

## 2018-04-26 ENCOUNTER — Other Ambulatory Visit: Payer: Self-pay | Admitting: Rheumatology

## 2018-05-03 ENCOUNTER — Encounter: Payer: Self-pay | Admitting: Adult Health

## 2018-05-03 ENCOUNTER — Ambulatory Visit (INDEPENDENT_AMBULATORY_CARE_PROVIDER_SITE_OTHER): Payer: 59 | Admitting: Adult Health

## 2018-05-03 VITALS — BP 118/78 | HR 87 | Ht 66.0 in | Wt 200.2 lb

## 2018-05-03 DIAGNOSIS — J449 Chronic obstructive pulmonary disease, unspecified: Secondary | ICD-10-CM | POA: Diagnosis not present

## 2018-05-03 DIAGNOSIS — I5033 Acute on chronic diastolic (congestive) heart failure: Secondary | ICD-10-CM

## 2018-05-03 DIAGNOSIS — Z720 Tobacco use: Secondary | ICD-10-CM

## 2018-05-03 DIAGNOSIS — G4733 Obstructive sleep apnea (adult) (pediatric): Secondary | ICD-10-CM

## 2018-05-03 DIAGNOSIS — I272 Pulmonary hypertension, unspecified: Secondary | ICD-10-CM

## 2018-05-03 DIAGNOSIS — J9611 Chronic respiratory failure with hypoxia: Secondary | ICD-10-CM

## 2018-05-03 MED ORDER — FLUTICASONE-UMECLIDIN-VILANT 100-62.5-25 MCG/INH IN AEPB: 1.0000 | INHALATION_SPRAY | Freq: Every day | RESPIRATORY_TRACT | 0 refills | Status: DC

## 2018-05-03 MED ORDER — FLUTICASONE-UMECLIDIN-VILANT 100-62.5-25 MCG/INH IN AEPB: 1.0000 | INHALATION_SPRAY | Freq: Every day | RESPIRATORY_TRACT | 3 refills | Status: DC

## 2018-05-03 NOTE — Assessment & Plan Note (Signed)
Pulmonary hypertension noted on echo and previous right heart cath.  Patient has underlying sleep apnea COPD with cor pulmonale Refer to cardiology.  Continue on oxygen Avoid fluid overload.  PLAN  Patient Instructions  Begin TRELEGY 1 puff daily , rinse after use.  Work on not smoking .  Continue on Oxygen 4l/m .  Refer to Cardiology  For Pulmonary HTN  Continue on CPAP At bedtime  . Bring your SD card next office visit.  REfer to pulmonary rehab  Follow up with Dr. Elsworth Soho  In 6-8 weeks and As needed

## 2018-05-03 NOTE — Patient Instructions (Addendum)
Begin TRELEGY 1 puff daily , rinse after use.  Work on not smoking .  Continue on Oxygen 4l/m .  Refer to Cardiology  For Pulmonary HTN  Continue on CPAP At bedtime  . Bring your SD card next office visit.  REfer to pulmonary rehab  Follow up with Dr. Elsworth Soho  In 6-8 weeks and As needed

## 2018-05-03 NOTE — Assessment & Plan Note (Signed)
Cor pulmonale and chronic diastolic heart failure.  Appears to be euvolemic.  Patient needs referral to cardiology as she did not follow-up with them as previously recommended

## 2018-05-03 NOTE — Assessment & Plan Note (Signed)
Continue on CPAP at bedtime Bring SD card to next visit for CPAP download

## 2018-05-03 NOTE — Assessment & Plan Note (Signed)
Compensated on present regimen. Smoking cessation was discussed. Need to restart controller medication.  Plan  Patient Instructions  Begin TRELEGY 1 puff daily , rinse after use.  Work on not smoking .  Continue on Oxygen 4l/m .  Refer to Cardiology  For Pulmonary HTN  Continue on CPAP At bedtime  . Bring your SD card next office visit.  REfer to pulmonary rehab  Follow up with Dr. Elsworth Soho  In 6-8 weeks and As needed

## 2018-05-03 NOTE — Assessment & Plan Note (Signed)
Continue on oxygen to keep O2 saturations greater than 90%.  Over the last couple years patient's O2 demands are slowly increasing now requiring 4 L of oxygen to keep sats greater than 90% suspect this is multifactorial with underlying COPD cor pulmonale and pulmonary hypertension.

## 2018-05-03 NOTE — Addendum Note (Signed)
Addended by: Parke Poisson E on: 05/03/2018 05:28 PM   Modules accepted: Orders

## 2018-05-03 NOTE — Assessment & Plan Note (Signed)
Smoking cessation discussed 

## 2018-05-03 NOTE — Progress Notes (Signed)
@Patient  ID: Teresa Roberts, female    DOB: 16-Feb-1961, 57 y.o.   MRN: [id number]  Chief Complaint  Patient presents with  . Follow-up    COPD    Referring provider: Jonathon Jordan, MD  HPI: 57 year old female active smoker followed for COPD, oxygen dependent respiratory failure and pulmonary hypertension  TEST/Events  Hospitalization March 2018 for hypoxia, discharged on 3 L of oxygen Hospitalization September 2018 hypercarbic respiratory failure with sepsis CT angiogram 01/2017 neg forpulmonary embolism .  ABG showed mild hypercarbia with 7.36/45/62 and 7.33/51/70 . LHCshowed no evidence of coronary artery disease and normal LV function.  RHCshowed RVpressures of 55/14 and LVEDP was 18. PVR not documented  Spirometry4/2018ratio of 69, FEV1 of 1.15-52% and FVC of 60% 2D echo September 2018 EF 55 to 60%, severely dilated right ventricle, moderately dilated right atrium, pulmonary artery pressure 55 mmHg.  05/03/2018 Follow up ; COPD , O2 RF , Pulmonary HTN   Patient returns for a 34-month follow-up.  Patient has moderate to severe COPD.  She continues to smoke.  Discussed smoking cessation.  She is trying to cut back on smoking. Was previously getting meds thru [place] . Has run out of Synmbicort and Spiriva .  Would like to try TRELEGY again.  She denies flare  of cough or wheezing..  Patient is on chronic oxygen.  Patient says that the [place] had increase her oxygen up to 3 to 4 L. She is currently on 4 L at home.  Oxygen check in the office showed that 4 L maintains her O2 saturations greater than 90%.  Previous echo and right heart cath has showed pulmonary hypertension with cor pulmonale. Patient did not follow-up with cardiology in past as recommended.  Patient has sleep apnea says she is on CPAP at bedtime.  This was managed at the [place] . Says that she feels rested.  We discussed bringing her SD card on next visit for evaluation.     Allergies  Allergen Reactions  .  Citrus Hives  . Lisinopril Cough    Immunization History  Administered Date(s) Administered  . Influenza Split 08/15/2017  . Influenza-Unspecified 08/15/2016  . Pneumococcal-Unspecified 02/13/2013    Past Medical History:  Diagnosis Date  . COPD (chronic obstructive pulmonary disease) ([place])    O2 dependent  . cpap   . Depression    PTSD  . Diastolic CHF ([place])   . Fibromyalgia   . Hyperlipidemia   . Hypertension   . Migraines     Tobacco History: Social History   Tobacco Use  Smoking Status Current Every Day Smoker  . Packs/day: 1.00  . Years: 39.00  . Pack years: 39.00  . Types: Cigarettes  Smokeless Tobacco Never Used  Tobacco Comment   down to 0.5ppd 06/22/17   Ready to quit: No Counseling given: Yes Comment: down to 0.5ppd 06/22/17   Outpatient Encounter Medications as of 05/03/2018  Medication Sig  . Acidophilus Lactobacillus CAPS Take 2 capsules by mouth every morning.  Marland Kitchen aspirin 325 MG EC tablet Take 1 tablet (325 mg total) by mouth 2 (two) times daily.  . Biotin 1 MG CAPS Take 1 mg by mouth daily.  . budesonide-formoterol (SYMBICORT) 160-4.5 MCG/ACT inhaler Inhale 2 puffs into the lungs 2 (two) times daily.  . busPIRone (BUSPAR) 15 MG tablet Take 15 mg by mouth 2 (two) times daily.  . butalbital-acetaminophen-caffeine (FIORICET, ESGIC) 50-325-40 MG tablet Take 1 tablet by mouth 2 (two) times daily as needed for headache.  Marland Kitchen  Carboxymethylcellulose Sod PF 0.25 % SOLN Apply 1 drop to eye 2 (two) times daily.  Marland Kitchen docusate sodium (COLACE) 100 MG capsule Take 100 mg by mouth 3 (three) times daily.  . DULoxetine (CYMBALTA) 30 MG capsule take 60mg  capsule once daily at bedtime  . gabapentin (NEURONTIN) 800 MG tablet Take 800 mg by mouth 2 (two) times daily.  . hydrochlorothiazide (HYDRODIURIL) 25 MG tablet Take 1 tablet (25 mg total) by mouth daily.  Marland Kitchen latanoprost (XALATAN) 0.005 % ophthalmic solution Place 1 drop into both eyes at bedtime.  . metaxalone (SKELAXIN)  800 MG tablet TAKE 1 TABLET (800 MG TOTAL) BY MOUTH 3 (THREE) TIMES DAILY AS NEEDED FOR MUSCLE SPASMS.  Marland Kitchen omeprazole (PRILOSEC OTC) 20 MG tablet Take 40 mg by mouth daily.  . rizatriptan (MAXALT-MLT) 10 MG disintegrating tablet Take 10 mg by mouth as needed for migraine. May repeat in 2 hours if needed  . simvastatin (ZOCOR) 10 MG tablet Take 10 mg by mouth at bedtime.  . Tiotropium Bromide Monohydrate (SPIRIVA RESPIMAT) 1.25 MCG/ACT AERS Inhale 2 puffs into the lungs daily.  Marland Kitchen topiramate (TOPAMAX) 25 MG tablet Take 50 mg by mouth every morning.  . traZODone (DESYREL) 100 MG tablet Take 100 mg by mouth at bedtime as needed for sleep.   No facility-administered encounter medications on file as of 05/03/2018.      Review of Systems  Constitutional:   No  weight loss, night sweats,  Fevers, chills,  +fatigue, or  lassitude.  HEENT:   No headaches,  Difficulty swallowing,  Tooth/dental problems, or  Sore throat,                No sneezing, itching, ear ache,  +nasal congestion, post nasal drip,   CV:  No chest pain,  Orthopnea, PND, swelling in lower extremities, anasarca, dizziness, palpitations, syncope.   GI  No heartburn, indigestion, abdominal pain, nausea, vomiting, diarrhea, change in bowel habits, loss of appetite, bloody stools.   Resp:    No chest wall deformity  Skin: no rash or lesions.  GU: no dysuria, change in color of urine, no urgency or frequency.  No flank pain, no hematuria   MS:  No joint pain or swelling.  No decreased range of motion.  No back pain.    Physical Exam  BP 118/78 (BP Location: Left Arm, Cuff Size: Normal)   Pulse 87   Ht 5\' 6"  (1.676 m)   Wt 200 lb 3.2 oz (90.8 kg)   LMP 08/19/2012   SpO2 90%   BMI 32.31 kg/m   GEN: A/Ox3; pleasant , NAD, obese    HEENT:  [place]/AT,  EACs-clear, TMs-wnl, NOSE-clear, THROAT-clear, no lesions, no postnasal drip or exudate noted. Class 2-3 MP airway   NECK:  Supple w/ fair ROM; no JVD; normal carotid impulses  w/o bruits; no thyromegaly or nodules palpated; no lymphadenopathy.    RESP  Clear  P & A; w/o, wheezes/ rales/ or rhonchi. no accessory muscle use, no dullness to percussion  CARD:  RRR, no m/r/g, no peripheral edema, pulses intact, no cyanosis or clubbing.  GI:   Soft & nt; nml bowel sounds; no organomegaly or masses detected.   Musco: Warm bil, no deformities or joint swelling noted.   Neuro: alert, no focal deficits noted.    Skin: Warm, no lesions or rashes    Lab Results:    BNP  ProBNP No results found for: PROBNP  Imaging: No results found.   Assessment &  Plan:   COPD (chronic obstructive pulmonary disease) ([place]) Compensated on present regimen. Smoking cessation was discussed. Need to restart controller medication.  Plan  Patient Instructions  Begin TRELEGY 1 puff daily , rinse after use.  Work on not smoking .  Continue on Oxygen 4l/m .  Refer to Cardiology  For Pulmonary HTN  Continue on CPAP At bedtime  . Bring your SD card next office visit.  REfer to pulmonary rehab  Follow up with Dr. Elsworth Soho  In 6-8 weeks and As needed          Respiratory failure with hypoxia ([place]) Continue on oxygen to keep O2 saturations greater than 90%.  Over the last couple years patient's O2 demands are slowly increasing now requiring 4 L of oxygen to keep sats greater than 90% suspect this is multifactorial with underlying COPD cor pulmonale and pulmonary hypertension.  Acute on chronic diastolic CHF (congestive heart failure) ([facility]) Cor pulmonale and chronic diastolic heart failure.  Appears to be euvolemic.  Patient needs referral to cardiology as she did not follow-up with them as previously recommended  Tobacco abuse Smoking cessation discussed  OSA (obstructive sleep apnea) Continue on CPAP at bedtime Bring SD card to next visit for CPAP download  Pulmonary hypertension ([place]) Pulmonary hypertension noted on echo and previous right heart cath.  Patient has  underlying sleep apnea COPD with cor pulmonale Refer to cardiology.  Continue on oxygen Avoid fluid overload.  PLAN  Patient Instructions  Begin TRELEGY 1 puff daily , rinse after use.  Work on not smoking .  Continue on Oxygen 4l/m .  Refer to Cardiology  For Pulmonary HTN  Continue on CPAP At bedtime  . Bring your SD card next office visit.  REfer to pulmonary rehab  Follow up with Dr. Elsworth Soho  In 6-8 weeks and As needed             Rexene Edison, NP 05/03/2018

## 2018-05-09 DIAGNOSIS — R55 Syncope and collapse: Secondary | ICD-10-CM | POA: Diagnosis not present

## 2018-05-09 DIAGNOSIS — N179 Acute kidney failure, unspecified: Secondary | ICD-10-CM | POA: Diagnosis not present

## 2018-05-09 DIAGNOSIS — E119 Type 2 diabetes mellitus without complications: Secondary | ICD-10-CM | POA: Diagnosis not present

## 2018-05-09 DIAGNOSIS — F39 Unspecified mood [affective] disorder: Secondary | ICD-10-CM | POA: Diagnosis present

## 2018-05-09 DIAGNOSIS — G43909 Migraine, unspecified, not intractable, without status migrainosus: Secondary | ICD-10-CM | POA: Diagnosis present

## 2018-05-09 DIAGNOSIS — I10 Essential (primary) hypertension: Secondary | ICD-10-CM | POA: Diagnosis not present

## 2018-05-09 DIAGNOSIS — J449 Chronic obstructive pulmonary disease, unspecified: Secondary | ICD-10-CM | POA: Diagnosis not present

## 2018-05-09 DIAGNOSIS — I272 Pulmonary hypertension, unspecified: Secondary | ICD-10-CM | POA: Diagnosis not present

## 2018-05-09 DIAGNOSIS — R0602 Shortness of breath: Secondary | ICD-10-CM | POA: Diagnosis not present

## 2018-05-09 DIAGNOSIS — Z720 Tobacco use: Secondary | ICD-10-CM | POA: Diagnosis not present

## 2018-05-09 DIAGNOSIS — I214 Non-ST elevation (NSTEMI) myocardial infarction: Secondary | ICD-10-CM | POA: Diagnosis not present

## 2018-05-09 DIAGNOSIS — G4733 Obstructive sleep apnea (adult) (pediatric): Secondary | ICD-10-CM | POA: Diagnosis present

## 2018-05-09 DIAGNOSIS — Z9981 Dependence on supplemental oxygen: Secondary | ICD-10-CM | POA: Diagnosis not present

## 2018-05-09 DIAGNOSIS — J9811 Atelectasis: Secondary | ICD-10-CM | POA: Diagnosis not present

## 2018-05-09 DIAGNOSIS — M797 Fibromyalgia: Secondary | ICD-10-CM | POA: Diagnosis present

## 2018-05-09 DIAGNOSIS — I071 Rheumatic tricuspid insufficiency: Secondary | ICD-10-CM | POA: Diagnosis present

## 2018-05-09 DIAGNOSIS — I228 Subsequent ST elevation (STEMI) myocardial infarction of other sites: Secondary | ICD-10-CM | POA: Diagnosis not present

## 2018-05-09 DIAGNOSIS — R9431 Abnormal electrocardiogram [ECG] [EKG]: Secondary | ICD-10-CM | POA: Diagnosis not present

## 2018-05-13 DIAGNOSIS — J449 Chronic obstructive pulmonary disease, unspecified: Secondary | ICD-10-CM | POA: Diagnosis not present

## 2018-05-16 NOTE — Progress Notes (Deleted)
Office Visit Note  Patient: Teresa Roberts             Date of Birth: [birth year]           MRN: [id number]             PCP: Jonathon Jordan, MD Referring: Jonathon Jordan, MD Visit Date: 05/30/2018 Occupation: @GUAROCC @    Subjective:  No chief complaint on file.   History of Present Illness: Teresa Roberts is a 57 y.o. female ***   Activities of Daily Living:  Patient reports morning stiffness for *** {minute/hour:19697}.   Patient {ACTIONS;DENIES/REPORTS:21021675::"Denies"} nocturnal pain.  Difficulty dressing/grooming: {ACTIONS;DENIES/REPORTS:21021675::"Denies"} Difficulty climbing stairs: {ACTIONS;DENIES/REPORTS:21021675::"Denies"} Difficulty getting out of chair: {ACTIONS;DENIES/REPORTS:21021675::"Denies"} Difficulty using hands for taps, buttons, cutlery, and/or writing: {ACTIONS;DENIES/REPORTS:21021675::"Denies"}   No Rheumatology ROS completed.   PMFS History:  Patient Active Problem List   Diagnosis Date Noted  . OSA (obstructive sleep apnea) 05/03/2018  . Pulmonary hypertension ([place]) 05/03/2018  . Bimalleolar fracture of left ankle 08/12/2017  . Acute renal failure (ARF) ([place]) 08/09/2017  . Acute on chronic diastolic CHF (congestive heart failure) ([place]) 08/09/2017  . Acute metabolic encephalopathy 18/84/1660  . Other fatigue 06/13/2017  . Primary insomnia 06/13/2017  . History of migraine 06/13/2017  . COPD (chronic obstructive pulmonary disease) ([place]) 02/15/2017  . Tobacco abuse 02/15/2017  . Respiratory failure with hypoxia ([place]) 01/28/2017  . Migraine 01/28/2017  . Daily headache   . Hypoxia   . Shortness of breath 01/26/2017  . Non-ST elevation myocardial infarction (NSTEMI), type 2 01/26/2017  . MGUS (monoclonal gammopathy of unknown significance) 01/11/2012  . Fibromyalgia 01/11/2012  . Bruises easily 01/11/2012    Past Medical History:  Diagnosis Date  . COPD (chronic obstructive pulmonary disease) ([place])    O2 dependent  . cpap   . Depression    PTSD  . Diastolic CHF ([place])   . Fibromyalgia   . Hyperlipidemia   . Hypertension   . Migraines     Family History  Problem Relation Age of Onset  . Diabetes Mother   . Hypertension Mother   . CAD Mother   . Diabetes Father   . Lung cancer Father   . Diabetes Sister    Past Surgical History:  Procedure Laterality Date  . APPENDECTOMY    . CESAREAN SECTION    . ORIF ANKLE FRACTURE Left 08/12/2017   Procedure: OPEN REDUCTION INTERNAL FIXATION (ORIF) ANKLE FRACTURE;  Surgeon: Rod Can, MD;  Location: [facility];  Service: Orthopedics;  Laterality: Left;  . RIGHT/LEFT HEART CATH AND CORONARY ANGIOGRAPHY N/A 01/27/2017   Procedure: Right/Left Heart Cath and Coronary Angiography;  Surgeon: Troy Sine, MD;  Location: [facility];  Service: Cardiovascular;  Laterality: N/A;   Social History   Social History Narrative  . Not on file     Objective: Vital Signs: LMP 08/19/2012    Physical Exam   Musculoskeletal Exam: ***  CDAI Exam: No CDAI exam completed.    Investigation: No additional findings. CBC Latest Ref Rng & Units 09/30/2017 08/13/2017 08/12/2017  WBC 3.9 - 10.3 10e3/uL 5.5 14.9(H) 13.4(H)  Hemoglobin 11.6 - 15.9 g/dL 13.6 10.2(L) 10.7(L)  Hematocrit 34.8 - 46.6 % 44.8 35.3(L) 35.7(L)  Platelets 145 - 400 10e3/uL 177 113(L) 122(L)   CMP Latest Ref Rng & Units 09/30/2017 09/30/2017 08/13/2017  Glucose 70 - 140 mg/dl 96 - 194(H)  BUN 7.0 - 26.0 mg/dL 8.8 - 18  Creatinine 0.6 - 1.1 mg/dL 0.8 - 0.83  Sodium 136 -  145 mEq/L 141 - 137  Potassium 3.5 - 5.1 mEq/L 3.7 - 4.0  Chloride 101 - 111 mmol/L - - 103  CO2 22 - 29 mEq/L 24 - 26  Calcium 8.4 - 10.4 mg/dL 10.4 - 9.7  Total Protein 6.0 - 8.5 g/dL 7.6 7.0 5.4(L)  Total Bilirubin 0.20 - 1.20 mg/dL 0.29 - 1.0  Alkaline Phos 40 - 150 U/L 161(H) - 109  AST 5 - 34 U/L 22 - 68(H)  ALT 0 - 55 U/L 32 - 603(H)    Imaging: No results found.  Speciality Comments: No specialty comments  available.    Procedures:  No procedures performed Allergies: Citrus and Lisinopril   Assessment / Plan:     Visit Diagnoses: No diagnosis found.    Orders: No orders of the defined types were placed in this encounter.  No orders of the defined types were placed in this encounter.   Face-to-face time spent with patient was *** minutes. Greater than 50% of time was spent in counseling and coordination of care.  Follow-Up Instructions: No follow-ups on file.   Earnestine Mealing, CMA  Note - This record has been created using Editor, commissioning.  Chart creation errors have been sought, but may not always  have been located. Such creation errors do not reflect on  the standard of medical care.

## 2018-05-20 ENCOUNTER — Other Ambulatory Visit: Payer: Self-pay | Admitting: Rheumatology

## 2018-05-22 NOTE — Telephone Encounter (Signed)
Last visit: 11/23/2017 Next visit: 05/30/2018  Okay to refill per Dr. Estanislado Pandy.

## 2018-05-26 NOTE — Progress Notes (Signed)
Office Visit Note  Patient: Teresa Roberts             Date of Birth: [birth year]           MRN: [id number]             PCP: Jonathon Jordan, MD Referring: Jonathon Jordan, MD Visit Date: 06/05/2018 Occupation: @GUAROCC @  Subjective:  Muscle spasms   History of Present Illness: Jennette Leask is a 57 y.o. female with history of fibromyalgia.  She takes Gabapentin 800 mg po BID. she is been having increased frequency and severity of fibromyalgia flares.  She is having generalized muscle aches muscle tenderness due to fibromyalgia.  She is also been having increased muscle spasms and muscle cramps especially at nighttime.  She states she is having increased spasms in her upper and lower extremities.  She states her fatigue is also been worsening.  She continues to have interrupted sleep at night and averages about 6 hours a night of sleep.  She states she wakes up several times a night due to cramps as well as pain.  She denies using heating pad or getting massages.  She states avoids taking over-the-counter medications for pain relief.  She states she continues to have trochanteric bursitis on the left side and performs exercises on a regular basis which provide minimal relief.  She denies any joint pain or joint swelling at this time.  She states that about 6 months ago she was placed on oxygen 4 L at rest and with exertion.  She states the oxygen has helped with her level of fatigue but she continues to have difficulty exercising as well as with any exertion.  Activities of Daily Living:  Patient reports morning stiffness for  2  hours.   Patient Denies nocturnal pain.  Difficulty dressing/grooming: Denies Difficulty climbing stairs: Reports Difficulty getting out of chair: Reports Difficulty using hands for taps, buttons, cutlery, and/or writing: Reports  Review of Systems  Constitutional: Positive for fatigue.  HENT: Positive for mouth dryness. Negative for mouth sores and nose dryness.   Eyes:  Positive for dryness. Negative for pain and visual disturbance.  Respiratory: Positive for cough (On Oxygen 4 L). Negative for hemoptysis, shortness of breath and difficulty breathing.   Cardiovascular: Negative for chest pain, palpitations, hypertension and swelling in legs/feet.  Gastrointestinal: Negative for blood in stool, constipation and diarrhea.  Endocrine: Negative for increased urination.  Genitourinary: Negative for painful urination.  Musculoskeletal: Positive for myalgias, morning stiffness, muscle tenderness and myalgias. Negative for arthralgias, joint pain, joint swelling and muscle weakness.  Skin: Negative for color change, pallor, rash, hair loss, nodules/bumps, skin tightness, ulcers and sensitivity to sunlight.  Allergic/Immunologic: Negative for susceptible to infections.  Neurological: Negative for dizziness, numbness, headaches and weakness.  Hematological: Negative for swollen glands.  Psychiatric/Behavioral: Positive for depressed mood and sleep disturbance. The patient is not nervous/anxious.     PMFS History:  Patient Active Problem List   Diagnosis Date Noted  . Chest pain 05/27/2018  . HLD (hyperlipidemia) 05/27/2018  . Essential hypertension 05/27/2018  . Depression 05/27/2018  . OSA (obstructive sleep apnea) 05/03/2018  . Pulmonary hypertension ([place]) 05/03/2018  . Bimalleolar fracture of left ankle 08/12/2017  . Acute renal failure (ARF) ([place]) 08/09/2017  . Acute on chronic diastolic CHF (congestive heart failure) ([place]) 08/09/2017  . Acute metabolic encephalopathy 50/93/2671  . Other fatigue 06/13/2017  . Primary insomnia 06/13/2017  . History of migraine 06/13/2017  . COPD (chronic obstructive pulmonary disease) (  [place]) 02/15/2017  . Tobacco abuse 02/15/2017  . Respiratory failure with hypoxia ([place]) 01/28/2017  . Migraine 01/28/2017  . Daily headache   . Hypoxia   . Shortness of breath 01/26/2017  . Non-ST elevation myocardial infarction (NSTEMI),  type 2 01/26/2017  . MGUS (monoclonal gammopathy of unknown significance) 01/11/2012  . Fibromyalgia 01/11/2012  . Bruises easily 01/11/2012    Past Medical History:  Diagnosis Date  . CAD (coronary artery disease)    a. 01/2017: cath showing 20% mid RCA stenosis and otherwise normal LAD and LCx with a preserved EF of 55 to 60%; mod pulmonary HTN  . COPD (chronic obstructive pulmonary disease) ([place])    O2 dependent  . cpap   . Depression    PTSD  . Diastolic CHF ([place])   . Fibromyalgia   . Hyperlipidemia   . Hypertension   . Migraines     Family History  Problem Relation Age of Onset  . Diabetes Mother   . Hypertension Mother   . CAD Mother   . Diabetes Father   . Lung cancer Father   . Diabetes Sister    Past Surgical History:  Procedure Laterality Date  . APPENDECTOMY    . CESAREAN SECTION    . ORIF ANKLE FRACTURE Left 08/12/2017   Procedure: OPEN REDUCTION INTERNAL FIXATION (ORIF) ANKLE FRACTURE;  Surgeon: Rod Can, MD;  Location: [facility];  Service: Orthopedics;  Laterality: Left;  . RIGHT/LEFT HEART CATH AND CORONARY ANGIOGRAPHY N/A 01/27/2017   Procedure: Right/Left Heart Cath and Coronary Angiography;  Surgeon: Troy Sine, MD;  Location: [facility];  Service: Cardiovascular;  Laterality: N/A;   Social History   Social History Narrative  . Not on file    Objective: Vital Signs: BP 112/74 (BP Location: Left Arm, Patient Position: Sitting, Cuff Size: Normal)   Pulse 86   Resp 18   Ht 5\' 4"  (1.626 m)   Wt 200 lb (90.7 kg)   LMP 08/19/2012   BMI 34.33 kg/m    Physical Exam  Constitutional: She is oriented to person, place, and time. She appears well-developed and well-nourished.  HENT:  Head: Normocephalic and atraumatic.  Eyes: Conjunctivae and EOM are normal.  Neck: Normal range of motion.  Cardiovascular: Normal rate, regular rhythm, normal heart sounds and intact distal pulses.  Pulmonary/Chest: Effort normal and breath sounds normal.    On Oxygen 4L   Abdominal: Soft. Bowel sounds are normal.  Lymphadenopathy:    She has no cervical adenopathy.  Neurological: She is alert and oriented to person, place, and time.  Skin: Skin is warm and dry. Capillary refill takes less than 2 seconds.  Psychiatric: She has a normal mood and affect. Her behavior is normal.  Nursing note and vitals reviewed.    Musculoskeletal Exam: C-spine limited ROM with discomfort.  Thoracic and lumbar spine good ROM.  No midline spinal tenderness. No SI joint tenderness.  Tenderness of bilateral trapezius muscles.  Limited shoulder abduction due to discomfort.  Elbow joints, wrist joints, MCPs, PIPs, DIPs good range of motion with no synovitis.  Hip joints, knee joints, ankle joints, MTPs, PIPs, DIPs good range of motion with no synovitis.  18 out of 18 fibromyalgia tender points.  She has generalized hyperalgesia on exam.  CDAI Exam: No CDAI exam completed.   Investigation: No additional findings.  Imaging: Dg Chest 2 View  Result Date: 05/27/2018 CLINICAL DATA:  Patient from home, is c/o of sob with generalized pain. Chest pain,  and dizziness present EXAM: CHEST - 2 VIEW COMPARISON:  08/11/2017 and older studies. FINDINGS: Cardiac silhouette is mildly enlarged. No mediastinal or hilar masses. No evidence of adenopathy. Mild linear opacity at the left lung base consistent with scarring, similar to the prior exam. No evidence of pneumonia or pulmonary edema. No pleural effusion or pneumothorax. Skeletal structures are intact. IMPRESSION: No active cardiopulmonary disease. Electronically Signed   By: Lajean Manes M.D.   On: 05/27/2018 16:32   Ct Angio Chest Pe W And/or Wo Contrast  Result Date: 05/27/2018 CLINICAL DATA:  57 y/o F; shortness of breath, chest pain, dizziness. EXAM: CT ANGIOGRAPHY CHEST WITH CONTRAST TECHNIQUE: Multidetector CT imaging of the chest was performed using the standard protocol during bolus administration of intravenous  contrast. Multiplanar CT image reconstructions and MIPs were obtained to evaluate the vascular anatomy. CONTRAST:  100 cc Isovue 370 COMPARISON:  01/26/2018 CT angiogram chest. FINDINGS: Cardiovascular: Moderate cardiomegaly with prominent right atrial and ventricular enlargement. Normal caliber thoracic aorta. Borderline enlarged main pulmonary artery. Mild coronary artery calcific atherosclerosis. Satisfactory opacification of the pulmonary arteries. Mediastinum/Nodes: No enlarged mediastinal, hilar, or axillary lymph nodes. Thyroid gland, trachea, and esophagus demonstrate no significant findings. Lungs/Pleura: Mild centrilobular emphysema greatest in the upper lobes. Smooth interlobular septal thickening compatible with interstitial edema. Minor atelectasis in the lung bases. No consolidation or pleural effusion. Upper Abdomen: No acute abnormality. Musculoskeletal: No chest wall abnormality. No acute or significant osseous findings. Review of the MIP images confirms the above findings. IMPRESSION: 1. No acute pulmonary embolus identified. 2. Moderate cardiomegaly with prominent right atrial and ventricular enlargement. 3. Interstitial pulmonary edema and mild centrilobular emphysema. Electronically Signed   By: Kristine Garbe M.D.   On: 05/27/2018 20:34    Recent Labs: Lab Results  Component Value Date   WBC 7.9 05/27/2018   HGB 10.7 (L) 05/27/2018   PLT 124 (L) 05/27/2018   NA 143 05/29/2018   K 3.7 05/29/2018   CL 101 05/29/2018   CO2 33 (H) 05/29/2018   GLUCOSE 93 05/29/2018   BUN 15 05/29/2018   CREATININE 0.95 05/29/2018   BILITOT 0.29 09/30/2017   ALKPHOS 161 (H) 09/30/2017   AST 22 09/30/2017   ALT 32 09/30/2017   PROT 7.6 09/30/2017   PROT 7.0 09/30/2017   ALBUMIN 3.6 09/30/2017   CALCIUM 9.7 05/29/2018   GFRAA >60 05/29/2018    Speciality Comments: No specialty comments available.  Procedures:  Trigger Point Inj Date/Time: 06/05/2018 2:26 PM Performed by: Ofilia Neas, PA-C Authorized by: Ofilia Neas, PA-C   Consent Given by:  Patient Site marked: the procedure site was marked   Timeout: prior to procedure the correct patient, procedure, and site was verified   Indications:  Pain Total # of Trigger Points:  2 Location: neck   Needle Size:  27 G Approach:  Dorsal Medications #1:  0.5 mL lidocaine 1 %; 10 mg triamcinolone acetonide 40 MG/ML Medications #2:  0.5 mL lidocaine 1 %; 10 mg triamcinolone acetonide 40 MG/ML Patient tolerance:  Patient tolerated the procedure well with no immediate complications   Allergies: Citrus and Lisinopril   Assessment / Plan:     Visit Diagnoses: Fibromyalgia -she has been having more frequent and severe fibromyalgia flares.  She has generalized hyperalgesia on exam.  She is a 18 out of 18 tender points on exam.  She has generalized muscle aches and muscle tenderness.  She has been having more frequent muscle spasms and muscle cramps,  especially at night. She continues to have chronic insomnia and fatigue.  She is no longer taking trazodone 100 mg.  She takes gabapentin 800 mg by mouth twice a day.  She requested  bilateral trapezius trigger point injections.  She tolerated the procedure well.  Potential side effects were discussed.  We discussed massage therapy and using a heating pad.  We also discussed OTC biofreeze, which she can apply topically. She was given a prescription for Zanaflex 4 mg at bedtime PRN for muscle spasms. Potential side effects were discussed. She will follow in the office in 6 months.   Trapezius muscle spasm -She has tenderness and tension of trapezius muscles bilaterally.  We discussed Biofreeze and massage therapy. She requested trigger point injections bilaterally.  She tolerated the procedure well.  She was given a prescription for Zanaflex 4 mg at bedtime for muscle spasms.  Plan: Trigger Point Inj   Neck pain: She has limited ROM of the C-spine with discomfort.  She has tenderness  and tension of the trapezius muscles bilaterally.   Trochanteric bursitis of left hip: She has tenderness of the left trochanteric bursa.  She performs stretching exercises on a regular basis.   Other fatigue: Chronic and related to insomnia.   Primary insomnia: She is no longer   Other medical conditions are listed as follows:   MGUS (monoclonal gammopathy of unknown significance)  History of migraine: She is on Topamax.  She does not see a neurologist.  She was advised to follow up with PCP for management of migraines.   History of COPD: She is on Oxygen 4L at rest and with exertion.    Tobacco abuse: She quit smoking 1 month ago.     Orders: Orders Placed This Encounter  Procedures  . Trigger Point Inj   Meds ordered this encounter  Medications  . tiZANidine (ZANAFLEX) 4 MG tablet    Sig: Take 1 tablet (4 mg total) by mouth at bedtime as needed for muscle spasms.    Dispense:  30 tablet    Refill:  0    Face-to-face time spent with patient was 30 minutes. Greater than 50% of time was spent in counseling and coordination of care.  Follow-Up Instructions: Return in about 6 months (around 12/06/2018) for Fibromyalgia.   Ofilia Neas, PA-C  Note - This record has been created using Dragon software.  Chart creation errors have been sought, but may not always  have been located. Such creation errors do not reflect on  the standard of medical care.

## 2018-05-27 ENCOUNTER — Other Ambulatory Visit: Payer: Self-pay

## 2018-05-27 ENCOUNTER — Inpatient Hospital Stay (HOSPITAL_COMMUNITY)
Admission: EM | Admit: 2018-05-27 | Discharge: 2018-05-29 | DRG: 292 | Disposition: A | Discharge status: Home / Self Care | Payer: 59 | Attending: Family Medicine | Admitting: Family Medicine

## 2018-05-27 ENCOUNTER — Emergency Department (HOSPITAL_COMMUNITY): Payer: 59

## 2018-05-27 ENCOUNTER — Encounter (HOSPITAL_COMMUNITY): Payer: Self-pay | Admitting: Emergency Medicine

## 2018-05-27 DIAGNOSIS — I110 Hypertensive heart disease with heart failure: Principal | ICD-10-CM | POA: Diagnosis present

## 2018-05-27 DIAGNOSIS — Z6834 Body mass index (BMI) 34.0-34.9, adult: Secondary | ICD-10-CM

## 2018-05-27 DIAGNOSIS — I071 Rheumatic tricuspid insufficiency: Secondary | ICD-10-CM | POA: Diagnosis present

## 2018-05-27 DIAGNOSIS — F329 Major depressive disorder, single episode, unspecified: Secondary | ICD-10-CM | POA: Diagnosis present

## 2018-05-27 DIAGNOSIS — G4733 Obstructive sleep apnea (adult) (pediatric): Secondary | ICD-10-CM

## 2018-05-27 DIAGNOSIS — J9611 Chronic respiratory failure with hypoxia: Secondary | ICD-10-CM | POA: Diagnosis present

## 2018-05-27 DIAGNOSIS — R0602 Shortness of breath: Secondary | ICD-10-CM | POA: Diagnosis not present

## 2018-05-27 DIAGNOSIS — I5033 Acute on chronic diastolic (congestive) heart failure: Secondary | ICD-10-CM | POA: Diagnosis present

## 2018-05-27 DIAGNOSIS — F32A Depression, unspecified: Secondary | ICD-10-CM | POA: Diagnosis present

## 2018-05-27 DIAGNOSIS — R079 Chest pain, unspecified: Secondary | ICD-10-CM | POA: Diagnosis present

## 2018-05-27 DIAGNOSIS — Z8249 Family history of ischemic heart disease and other diseases of the circulatory system: Secondary | ICD-10-CM

## 2018-05-27 DIAGNOSIS — E785 Hyperlipidemia, unspecified: Secondary | ICD-10-CM | POA: Diagnosis present

## 2018-05-27 DIAGNOSIS — J449 Chronic obstructive pulmonary disease, unspecified: Secondary | ICD-10-CM | POA: Diagnosis present

## 2018-05-27 DIAGNOSIS — Z91018 Allergy to other foods: Secondary | ICD-10-CM

## 2018-05-27 DIAGNOSIS — Z833 Family history of diabetes mellitus: Secondary | ICD-10-CM

## 2018-05-27 DIAGNOSIS — Z888 Allergy status to other drugs, medicaments and biological substances status: Secondary | ICD-10-CM

## 2018-05-27 DIAGNOSIS — I2781 Cor pulmonale (chronic): Secondary | ICD-10-CM | POA: Diagnosis present

## 2018-05-27 DIAGNOSIS — Z79899 Other long term (current) drug therapy: Secondary | ICD-10-CM

## 2018-05-27 DIAGNOSIS — I10 Essential (primary) hypertension: Secondary | ICD-10-CM | POA: Diagnosis not present

## 2018-05-27 DIAGNOSIS — R0789 Other chest pain: Secondary | ICD-10-CM | POA: Diagnosis present

## 2018-05-27 DIAGNOSIS — I272 Pulmonary hypertension, unspecified: Secondary | ICD-10-CM | POA: Diagnosis present

## 2018-05-27 DIAGNOSIS — J441 Chronic obstructive pulmonary disease with (acute) exacerbation: Secondary | ICD-10-CM | POA: Diagnosis present

## 2018-05-27 DIAGNOSIS — I2510 Atherosclerotic heart disease of native coronary artery without angina pectoris: Secondary | ICD-10-CM | POA: Diagnosis present

## 2018-05-27 DIAGNOSIS — Z7982 Long term (current) use of aspirin: Secondary | ICD-10-CM

## 2018-05-27 DIAGNOSIS — R7989 Other specified abnormal findings of blood chemistry: Secondary | ICD-10-CM | POA: Diagnosis present

## 2018-05-27 DIAGNOSIS — I5032 Chronic diastolic (congestive) heart failure: Secondary | ICD-10-CM | POA: Diagnosis present

## 2018-05-27 DIAGNOSIS — G8929 Other chronic pain: Secondary | ICD-10-CM | POA: Diagnosis present

## 2018-05-27 DIAGNOSIS — D472 Monoclonal gammopathy: Secondary | ICD-10-CM | POA: Diagnosis present

## 2018-05-27 DIAGNOSIS — R778 Other specified abnormalities of plasma proteins: Secondary | ICD-10-CM

## 2018-05-27 DIAGNOSIS — Z9981 Dependence on supplemental oxygen: Secondary | ICD-10-CM

## 2018-05-27 DIAGNOSIS — Z720 Tobacco use: Secondary | ICD-10-CM | POA: Diagnosis present

## 2018-05-27 DIAGNOSIS — F17210 Nicotine dependence, cigarettes, uncomplicated: Secondary | ICD-10-CM | POA: Diagnosis present

## 2018-05-27 DIAGNOSIS — M797 Fibromyalgia: Secondary | ICD-10-CM | POA: Diagnosis present

## 2018-05-27 DIAGNOSIS — R9431 Abnormal electrocardiogram [ECG] [EKG]: Secondary | ICD-10-CM | POA: Diagnosis present

## 2018-05-27 DIAGNOSIS — F4310 Post-traumatic stress disorder, unspecified: Secondary | ICD-10-CM | POA: Diagnosis present

## 2018-05-27 DIAGNOSIS — Z801 Family history of malignant neoplasm of trachea, bronchus and lung: Secondary | ICD-10-CM

## 2018-05-27 DIAGNOSIS — G43909 Migraine, unspecified, not intractable, without status migrainosus: Secondary | ICD-10-CM | POA: Diagnosis present

## 2018-05-27 DIAGNOSIS — I2729 Other secondary pulmonary hypertension: Secondary | ICD-10-CM | POA: Diagnosis present

## 2018-05-27 DIAGNOSIS — Z8669 Personal history of other diseases of the nervous system and sense organs: Secondary | ICD-10-CM

## 2018-05-27 HISTORY — DX: Atherosclerotic heart disease of native coronary artery without angina pectoris: I25.10

## 2018-05-27 LAB — BASIC METABOLIC PANEL
Anion gap: 10 (ref 5–15)
BUN: 13 mg/dL (ref 6–20)
CO2: 26 mmol/L (ref 22–32)
Calcium: 10 mg/dL (ref 8.9–10.3)
Chloride: 103 mmol/L (ref 98–111)
Creatinine, Ser: 1 mg/dL (ref 0.44–1.00)
GFR calc Af Amer: >60 mL/min (ref >60)
GFR calc non Af Amer: >60 mL/min (ref >60)
Glucose, Bld: 93 mg/dL (ref 70–99)
Potassium: 4.3 mmol/L (ref 3.5–5.1)
Sodium: 139 mmol/L (ref 135–145)

## 2018-05-27 LAB — CBC
HCT: 37.9 % (ref 36.0–46.0)
Hemoglobin: 10.7 g/dL — ABNORMAL LOW (ref 12.0–15.0)
MCH: 20.5 pg — ABNORMAL LOW (ref 26.0–34.0)
MCHC: 28.2 g/dL — ABNORMAL LOW (ref 30.0–36.0)
MCV: 72.5 fL — ABNORMAL LOW (ref 78.0–100.0)
Platelets: 124 10*3/uL — ABNORMAL LOW (ref 150–400)
RBC: 5.23 MIL/uL — ABNORMAL HIGH (ref 3.87–5.11)
RDW: 22.4 % — ABNORMAL HIGH (ref 11.5–15.5)
WBC: 7.9 10*3/uL (ref 4.0–10.5)

## 2018-05-27 LAB — I-STAT TROPONIN, ED: Troponin i, poc: 0.43 ng/mL (ref 0.00–0.08)

## 2018-05-27 LAB — I-STAT BETA HCG BLOOD, ED (MC, WL, AP ONLY): I-stat hCG, quantitative: <5 m[IU]/mL (ref <5)

## 2018-05-27 MED ORDER — ALBUTEROL SULFATE (2.5 MG/3ML) 0.083% IN NEBU: 2.5000 mg | INHALATION_SOLUTION | RESPIRATORY_TRACT | Status: DC | PRN

## 2018-05-27 MED ORDER — BUTALBITAL-APAP-CAFFEINE 50-325-40 MG PO TABS: 1.0000 | ORAL_TABLET | Freq: Two times a day (BID) | ORAL | Status: DC | PRN

## 2018-05-27 MED ORDER — METAXALONE 800 MG PO TABS
800.0000 mg | ORAL_TABLET | Freq: Three times a day (TID) | ORAL | Status: DC | PRN
  Administered 2018-05-28 – 2018-05-29 (×4): 800 mg via ORAL
  Filled 2018-05-27 (×5): qty 1

## 2018-05-27 MED ORDER — GABAPENTIN 800 MG PO TABS
800.0000 mg | ORAL_TABLET | Freq: Two times a day (BID) | ORAL | Status: DC
  Filled 2018-05-27: qty 1

## 2018-05-27 MED ORDER — IOPAMIDOL (ISOVUE-370) INJECTION 76%
INTRAVENOUS | Status: AC
  Filled 2018-05-27: qty 100

## 2018-05-27 MED ORDER — ALBUTEROL SULFATE (2.5 MG/3ML) 0.083% IN NEBU: 5.0000 mg | INHALATION_SOLUTION | Freq: Once | RESPIRATORY_TRACT | Status: DC

## 2018-05-27 MED ORDER — SUMATRIPTAN SUCCINATE 25 MG PO TABS
25.0000 mg | ORAL_TABLET | ORAL | Status: DC | PRN
  Filled 2018-05-27: qty 1

## 2018-05-27 MED ORDER — UMECLIDINIUM BROMIDE 62.5 MCG/INH IN AEPB
1.0000 | INHALATION_SPRAY | Freq: Every day | RESPIRATORY_TRACT | Status: DC
  Administered 2018-05-28 – 2018-05-29 (×2): 1 via RESPIRATORY_TRACT
  Filled 2018-05-27: qty 7

## 2018-05-27 MED ORDER — ATORVASTATIN CALCIUM 20 MG PO TABS
80.0000 mg | ORAL_TABLET | Freq: Every day | ORAL | Status: DC
  Administered 2018-05-28 (×2): 80 mg via ORAL
  Filled 2018-05-27 (×2): qty 4

## 2018-05-27 MED ORDER — SACCHAROMYCES BOULARDII 250 MG PO CAPS
250.0000 mg | ORAL_CAPSULE | Freq: Every day | ORAL | Status: DC
  Administered 2018-05-28 – 2018-05-29 (×2): 250 mg via ORAL
  Filled 2018-05-27 (×2): qty 1

## 2018-05-27 MED ORDER — HAIR/SKIN/NAILS/BIOTIN PO TABS: 1.0000 | ORAL_TABLET | Freq: Every day | ORAL | Status: DC

## 2018-05-27 MED ORDER — LATANOPROST 0.005 % OP SOLN
1.0000 [drp] | Freq: Every day | OPHTHALMIC | Status: DC
  Administered 2018-05-28 (×2): 1 [drp] via OPHTHALMIC
  Filled 2018-05-27: qty 2.5

## 2018-05-27 MED ORDER — SIMVASTATIN 10 MG PO TABS: 10.0000 mg | ORAL_TABLET | Freq: Every day | ORAL | Status: DC

## 2018-05-27 MED ORDER — TOPIRAMATE 25 MG PO TABS
50.0000 mg | ORAL_TABLET | Freq: Every day | ORAL | Status: DC
  Filled 2018-05-27: qty 2

## 2018-05-27 MED ORDER — ZOLPIDEM TARTRATE 5 MG PO TABS: 5.0000 mg | ORAL_TABLET | Freq: Every evening | ORAL | Status: DC | PRN

## 2018-05-27 MED ORDER — ONDANSETRON HCL 4 MG/2ML IJ SOLN: 4.0000 mg | Freq: Four times a day (QID) | INTRAMUSCULAR | Status: DC | PRN

## 2018-05-27 MED ORDER — MODAFINIL 100 MG PO TABS
100.0000 mg | ORAL_TABLET | Freq: Every day | ORAL | Status: DC
  Administered 2018-05-28 – 2018-05-29 (×2): 100 mg via ORAL
  Filled 2018-05-27 (×2): qty 1

## 2018-05-27 MED ORDER — DULOXETINE HCL 30 MG PO CPEP
30.0000 mg | ORAL_CAPSULE | Freq: Every day | ORAL | Status: DC
  Administered 2018-05-28 – 2018-05-29 (×2): 30 mg via ORAL
  Filled 2018-05-27 (×2): qty 1

## 2018-05-27 MED ORDER — FUROSEMIDE 10 MG/ML IJ SOLN
60.0000 mg | Freq: Once | INTRAMUSCULAR | Status: AC
  Administered 2018-05-27: 60 mg via INTRAVENOUS
  Filled 2018-05-27: qty 6

## 2018-05-27 MED ORDER — ENOXAPARIN SODIUM 40 MG/0.4ML ~~LOC~~ SOLN
40.0000 mg | SUBCUTANEOUS | Status: DC
Start: 2018-05-27 — End: 2018-05-29
  Administered 2018-05-28 (×2): 40 mg via SUBCUTANEOUS
  Filled 2018-05-27 (×2): qty 0.4

## 2018-05-27 MED ORDER — POLYVINYL ALCOHOL 1.4 % OP SOLN
1.0000 [drp] | Freq: Two times a day (BID) | OPHTHALMIC | Status: DC | PRN
  Filled 2018-05-27: qty 15

## 2018-05-27 MED ORDER — MORPHINE SULFATE (PF) 4 MG/ML IV SOLN
2.0000 mg | INTRAVENOUS | Status: DC | PRN
  Administered 2018-05-28: 2 mg via INTRAVENOUS
  Filled 2018-05-27: qty 1

## 2018-05-27 MED ORDER — FLUTICASONE FUROATE-VILANTEROL 100-25 MCG/INH IN AEPB
1.0000 | INHALATION_SPRAY | Freq: Every day | RESPIRATORY_TRACT | Status: DC
  Administered 2018-05-28 – 2018-05-29 (×2): 1 via RESPIRATORY_TRACT
  Filled 2018-05-27: qty 28

## 2018-05-27 MED ORDER — ACETAMINOPHEN 325 MG PO TABS: 650.0000 mg | ORAL_TABLET | ORAL | Status: DC | PRN

## 2018-05-27 MED ORDER — FLUTICASONE-UMECLIDIN-VILANT 100-62.5-25 MCG/INH IN AEPB: 1.0000 | INHALATION_SPRAY | Freq: Every day | RESPIRATORY_TRACT | Status: DC

## 2018-05-27 MED ORDER — FOLIC ACID 1 MG PO TABS
1.0000 mg | ORAL_TABLET | Freq: Every day | ORAL | Status: DC
  Administered 2018-05-28: 1 mg via ORAL
  Filled 2018-05-27: qty 1

## 2018-05-27 MED ORDER — AMLODIPINE BESYLATE 2.5 MG PO TABS
2.5000 mg | ORAL_TABLET | Freq: Every day | ORAL | Status: DC
  Administered 2018-05-28 – 2018-05-29 (×2): 2.5 mg via ORAL
  Filled 2018-05-27 (×2): qty 1

## 2018-05-27 MED ORDER — GABAPENTIN 400 MG PO CAPS
800.0000 mg | ORAL_CAPSULE | Freq: Two times a day (BID) | ORAL | Status: DC
Start: 2018-05-27 — End: 2018-05-29
  Administered 2018-05-28 – 2018-05-29 (×4): 800 mg via ORAL
  Filled 2018-05-27 (×4): qty 2

## 2018-05-27 MED ORDER — VITAMIN B-12 1000 MCG PO TABS
1000.0000 ug | ORAL_TABLET | Freq: Every day | ORAL | Status: DC
  Administered 2018-05-28: 1000 ug via ORAL
  Filled 2018-05-27: qty 1

## 2018-05-27 MED ORDER — IPRATROPIUM-ALBUTEROL 0.5-2.5 (3) MG/3ML IN SOLN
3.0000 mL | Freq: Three times a day (TID) | RESPIRATORY_TRACT | Status: DC
  Administered 2018-05-28 – 2018-05-29 (×4): 3 mL via RESPIRATORY_TRACT
  Filled 2018-05-27 (×4): qty 3

## 2018-05-27 MED ORDER — DOCUSATE SODIUM 100 MG PO CAPS
100.0000 mg | ORAL_CAPSULE | Freq: Three times a day (TID) | ORAL | Status: DC
  Administered 2018-05-28 – 2018-05-29 (×3): 100 mg via ORAL
  Filled 2018-05-27 (×5): qty 1

## 2018-05-27 MED ORDER — IOPAMIDOL (ISOVUE-370) INJECTION 76%
100.0000 mL | Freq: Once | INTRAVENOUS | Status: AC | PRN
  Administered 2018-05-27: 100 mL via INTRAVENOUS

## 2018-05-27 MED ORDER — TRAZODONE HCL 50 MG PO TABS
100.0000 mg | ORAL_TABLET | Freq: Every day | ORAL | Status: DC
  Administered 2018-05-28 (×2): 100 mg via ORAL
  Filled 2018-05-27 (×2): qty 2

## 2018-05-27 MED ORDER — IPRATROPIUM-ALBUTEROL 0.5-2.5 (3) MG/3ML IN SOLN: 3.0000 mL | Freq: Four times a day (QID) | RESPIRATORY_TRACT | Status: DC

## 2018-05-27 MED ORDER — IPRATROPIUM-ALBUTEROL 0.5-2.5 (3) MG/3ML IN SOLN: 3.0000 mL | Freq: Four times a day (QID) | RESPIRATORY_TRACT | Status: DC | PRN

## 2018-05-27 MED ORDER — ASPIRIN EC 325 MG PO TBEC
325.0000 mg | DELAYED_RELEASE_TABLET | Freq: Every day | ORAL | Status: DC
  Administered 2018-05-28 (×2): 325 mg via ORAL
  Filled 2018-05-27 (×2): qty 1

## 2018-05-27 MED ORDER — MORPHINE SULFATE (PF) 4 MG/ML IV SOLN
6.0000 mg | Freq: Once | INTRAVENOUS | Status: AC
Start: 2018-05-27 — End: 2018-05-27
  Administered 2018-05-27: 6 mg via INTRAVENOUS
  Filled 2018-05-27: qty 2

## 2018-05-27 MED ORDER — BUSPIRONE HCL 5 MG PO TABS
15.0000 mg | ORAL_TABLET | Freq: Two times a day (BID) | ORAL | Status: DC
  Administered 2018-05-28 – 2018-05-29 (×4): 15 mg via ORAL
  Filled 2018-05-27 (×4): qty 1

## 2018-05-27 MED ORDER — HYDRALAZINE HCL 20 MG/ML IJ SOLN: 5.0000 mg | INTRAMUSCULAR | Status: DC | PRN

## 2018-05-27 MED ORDER — NITROGLYCERIN 0.4 MG SL SUBL: 0.4000 mg | SUBLINGUAL_TABLET | SUBLINGUAL | Status: DC | PRN

## 2018-05-27 NOTE — ED Triage Notes (Addendum)
Patient from home, is c/o of sob with generalized pain. Chest pain, and dizziness present. Patient states she had to be taken off an airplane two weeks ago after she lost consciousness and was admitted to the hospital there and stayed for two nights. Hx of COPD and fibromyalgia. Patient wears 4L O2 at home. VSS in triage.

## 2018-05-27 NOTE — ED Notes (Signed)
Attempted IV x's 1 without success.

## 2018-05-27 NOTE — ED Notes (Signed)
IV team at bedside 

## 2018-05-27 NOTE — H&P (Signed)
History and Physical    Noell Lorensen FGH:[id number] DOB: 10-Feb-1961 DOA: 05/27/2018  Referring MD/NP/PA:   PCP: Jonathon Jordan, MD   Patient coming from:  The patient is coming from home.  At baseline, pt is independent for most of ADL.        Chief Complaint: Chest pain, shortness of breath  HPI: Teresa Roberts is a 57 y.o. female with medical history significant of fibromyalgia, hypertension, hyperlipidemia, COPD on 4 L oxygen at home, depression, dCHF, pulmonary hypertension, migraine headache, obesity, OSA on CPAP, former smoker (quit smoking 2 weeks ago), who presents with chest pain and shortness of breath.  Patient states that her chest pain started yesterday afternoon, which is located in the substernal area, constant, 8 out of 10 severity, pressure-like, nonradiating.  She does not have cough, fever or chills.  No wheezing.  Patient does not have nausea, vomiting, diarrhea, abdominal pain, symptoms of UTI or unilateral weakness.  Patient states that she quit smoking 2 weeks ago. she states that she does not need nicotine patch.    Of note, patient had syncope event at the end of June. She was hospitalized to the [facility] in [place]. She brought a copy of her labs/testing. Pt had troponin 0.994. Myoview stress test that showed normal myocardial perfusion scan without evidence of infarct or inducible ischemia and EF 66%.  VQ scan was very low probability for PE.  2D echo showed normal EF with grade 1 diastolic dysfunction, severely elevated right ventricular systolic pressure, moderate to severe tricuspid regurgitation, mild pulmonic regurgitation.  Carotid Doppler did not show hemodynamically significant stenosis.  Patient also had R/LHC through [facility] 01/2017 which showedd nonobstructive CAD with 20% narrowing in mid RCA and otherwise coronaries normal.   ED Course: pt was found to have troponin 0.43, WBC 7.9, negative pregnancy test, electrolytes renal function okay,  temperature normal, no tachycardia, oxygen saturation 94% on 4 L nasal cannula oxygen, chest x-ray negative.  CT angiogram is negative for PE, but showed interstitial pulmonary edema.  Patient is placed on telemetry bed for observation.  Review of Systems:   General: no fevers, chills, no body weight gain, has poor appetite, has fatigue HEENT: no blurry vision, hearing changes or sore throat Respiratory: has dyspnea, no coughing, wheezing CV: has chest pain, no palpitations GI: no nausea, vomiting, abdominal pain, diarrhea, constipation GU: no dysuria, burning on urination, increased urinary frequency, hematuria  Ext: has trace leg edema Neuro: no unilateral weakness, numbness, or tingling, no vision change or hearing loss Skin: no rash, no skin tear. MSK: No muscle spasm, no deformity, no limitation of range of movement in spin Heme: No easy bruising.  Travel history: No recent long distant travel.  Allergy:  Allergies  Allergen Reactions  . Citrus Hives  . Lisinopril Cough    Past Medical History:  Diagnosis Date  . COPD (chronic obstructive pulmonary disease) ([place])    O2 dependent  . cpap   . Depression    PTSD  . Diastolic CHF ([place])   . Fibromyalgia   . Hyperlipidemia   . Hypertension   . Migraines     Past Surgical History:  Procedure Laterality Date  . APPENDECTOMY    . CESAREAN SECTION    . ORIF ANKLE FRACTURE Left 08/12/2017   Procedure: OPEN REDUCTION INTERNAL FIXATION (ORIF) ANKLE FRACTURE;  Surgeon: Rod Can, MD;  Location: [place];  Service: Orthopedics;  Laterality: Left;  . RIGHT/LEFT HEART CATH AND CORONARY ANGIOGRAPHY N/A  01/27/2017   Procedure: Right/Left Heart Cath and Coronary Angiography;  Surgeon: Troy Sine, MD;  Location: [place] [facility];  Service: Cardiovascular;  Laterality: N/A;    Social History:  reports that she has been smoking cigarettes.  She has a 39.00 pack-year smoking history. She has never used smokeless tobacco. She  reports that she does not drink alcohol or use drugs.  Family History:  Family History  Problem Relation Age of Onset  . Diabetes Mother   . Hypertension Mother   . CAD Mother   . Diabetes Father   . Lung cancer Father   . Diabetes Sister      Prior to Admission medications   Medication Sig Start Date End Date Taking? Authorizing Provider  amLODipine (NORVASC) 2.5 MG tablet Take 2.5 mg by mouth daily. 05/11/18  Yes [provider]  Artificial Tear Solution (JUST TEARS EYE DROPS) SOLN Place 1 drop into both eyes 2 (two) times daily as needed (dry eyes).   Yes [provider]  aspirin 81 MG EC tablet Take 81 mg by mouth daily after lunch. 05/11/18  Yes [provider]  busPIRone (BUSPAR) 15 MG tablet Take 15 mg by mouth 2 (two) times daily.   Yes [provider]  butalbital-acetaminophen-caffeine (FIORICET, ESGIC) 50-325-40 MG tablet Take 1 tablet by mouth 2 (two) times daily as needed for headache.   Yes [provider]  docusate sodium (COLACE) 100 MG capsule Take 100 mg by mouth 3 (three) times daily.   Yes [provider]  DULoxetine (CYMBALTA) 30 MG capsule take 60mg  capsule once daily at bedtime Patient taking differently: Take 30 mg by mouth daily.  02/21/18  Yes Deveshwar, Abel Presto, MD  Fluticasone-Umeclidin-Vilant (TRELEGY ELLIPTA) 100-62.5-25 MCG/INH AEPB Inhale 1 puff into the lungs daily. 05/03/18  Yes Parrett, Fonnie Mu, NP  folic acid (FOLVITE) 1 MG tablet Take 1 mg by mouth daily after lunch. 05/11/18  Yes [provider]  gabapentin (NEURONTIN) 800 MG tablet Take 800 mg by mouth 2 (two) times daily.   Yes [provider]  latanoprost (XALATAN) 0.005 % ophthalmic solution Place 1 drop into both eyes at bedtime.   Yes [provider]  metaxalone (SKELAXIN) 800 MG tablet TAKE 1 TABLET (800 MG TOTAL) BY MOUTH 3 (THREE) TIMES DAILY AS NEEDED FOR MUSCLE SPASMS. 05/22/18  Yes Deveshwar, Abel Presto, MD  modafinil  (PROVIGIL) 100 MG tablet Take 100 mg by mouth daily. 04/24/18  Yes [provider]  Multiple Vitamins-Minerals (HAIR/SKIN/NAILS/BIOTIN) TABS Take 1 tablet by mouth daily after lunch.   Yes [provider]  OXYGEN Inhale 4 L into the lungs continuous.   Yes [provider]  Probiotic Product (PROBIOTIC PO) Take 1 tablet by mouth daily.   Yes [provider]  rizatriptan (MAXALT-MLT) 10 MG disintegrating tablet Take 10 mg by mouth as needed for migraine. May repeat in 2 hours if needed   Yes [provider]  simvastatin (ZOCOR) 10 MG tablet Take 10 mg by mouth at bedtime.   Yes [provider]  topiramate (TOPAMAX) 25 MG tablet Take 50 mg by mouth daily.    Yes [provider]  traZODone (DESYREL) 100 MG tablet Take 100 mg by mouth at bedtime.    Yes [provider]  vitamin B-12 (CYANOCOBALAMIN) 1000 MCG tablet Take 1,000 mcg by mouth daily after lunch.   Yes [provider]  aspirin 325 MG EC tablet Take 1 tablet (325 mg total) by mouth 2 (  two) times daily. Patient not taking: Reported on 05/27/2018 08/13/17   Aline August, MD  Fluticasone-Umeclidin-Vilant (TRELEGY ELLIPTA) 100-62.5-25 MCG/INH AEPB Inhale 1 puff into the lungs daily. Patient not taking: Reported on 05/27/2018 05/03/18   Parrett, Fonnie Mu, NP  hydrochlorothiazide (HYDRODIURIL) 25 MG tablet Take 1 tablet (25 mg total) by mouth daily. Patient not taking: Reported on 05/27/2018 01/29/17   Holley Raring, MD    Physical Exam: Vitals:   05/27/18 2030 05/27/18 2045 05/27/18 2100 05/27/18 2305  BP: (!) 147/99 (!) 142/96 134/80 (!) 83/48  Pulse:  72  94  Resp: 17 17 17 20   Temp:    97.8 F (36.6 C)  TempSrc:      SpO2:  100%  100%  Weight:      Height:       General: Not in acute distress HEENT:       Eyes: PERRL, EOMI, no scleral icterus.       ENT: No discharge from the ears and nose, no pharynx injection, no tonsillar enlargement.        Neck:  Difficult to assess JVD due to obesity. no bruit, no mass felt. Heme: No neck lymph node enlargement. Cardiac: S1/S2, RRR, No murmurs, No gallops or rubs. Respiratory: has decreased air movement bilaterally. No rales, wheezing, rhonchi or rubs. GI: Soft, nondistended, nontender, no rebound pain, no organomegaly, BS present. GU: No hematuria Ext: has trace leg edema bilaterally. 2+DP/PT pulse bilaterally. Musculoskeletal: No joint deformities, No joint redness or warmth, no limitation of ROM in spin. Skin: No rashes.  Neuro: Alert, oriented X3, cranial nerves II-XII grossly intact, moves all extremities normally.  Psych: Patient is not psychotic, no suicidal or hemocidal ideation.  Labs on Admission: I have personally reviewed following labs and imaging studies  CBC: Recent Labs  Lab 05/27/18 1618  WBC 7.9  HGB 10.7*  HCT 37.9  MCV 72.5*  PLT 010*   Basic Metabolic Panel: Recent Labs  Lab 05/27/18 1618  NA 139  K 4.3  CL 103  CO2 26  GLUCOSE 93  BUN 13  CREATININE 1.00  CALCIUM 10.0   GFR: Estimated Creatinine Clearance: 66.7 mL/min (by C-G formula based on SCr of 1 mg/dL). Liver Function Tests: No results for input(s): AST, ALT, ALKPHOS, BILITOT, PROT, ALBUMIN in the last 168 hours. No results for input(s): LIPASE, AMYLASE in the last 168 hours. No results for input(s): AMMONIA in the last 168 hours. Coagulation Profile: No results for input(s): INR, PROTIME in the last 168 hours. Cardiac Enzymes: No results for input(s): CKTOTAL, CKMB, CKMBINDEX, TROPONINI in the last 168 hours. BNP (last 3 results) No results for input(s): PROBNP in the last 8760 hours. HbA1C: No results for input(s): HGBA1C in the last 72 hours. CBG: No results for input(s): GLUCAP in the last 168 hours. Lipid Profile: No results for input(s): CHOL, HDL, LDLCALC, TRIG, CHOLHDL, LDLDIRECT in the last 72 hours. Thyroid Function Tests: No results for input(s): TSH, T4TOTAL, FREET4, T3FREE,  THYROIDAB in the last 72 hours. Anemia Panel: No results for input(s): VITAMINB12, FOLATE, FERRITIN, TIBC, IRON, RETICCTPCT in the last 72 hours. Urine analysis:    Component Value Date/Time   COLORURINE YELLOW 08/09/2017 [place] 08/09/2017 1039   LABSPEC 1.016 08/09/2017 1039   PHURINE 5.0 08/09/2017 1039   GLUCOSEU NEGATIVE 08/09/2017 1039   HGBUR MODERATE (A) 08/09/2017 1039   BILIRUBINUR NEGATIVE 08/09/2017 1039   KETONESUR NEGATIVE 08/09/2017 1039   PROTEINUR NEGATIVE 08/09/2017 1039  UROBILINOGEN 1.0 09/12/2014 1952   NITRITE NEGATIVE 08/09/2017 1039   LEUKOCYTESUR MODERATE (A) 08/09/2017 1039   Sepsis Labs: @LABRCNTIP (procalcitonin:4,lacticidven:4) )No results found for this or any previous visit (from the past 240 hour(s)).   Radiological Exams on Admission: Dg Chest 2 View  Result Date: 05/27/2018 CLINICAL DATA:  Patient from home, is c/o of sob with generalized pain. Chest pain, and dizziness present EXAM: CHEST - 2 VIEW COMPARISON:  08/11/2017 and older studies. FINDINGS: Cardiac silhouette is mildly enlarged. No mediastinal or hilar masses. No evidence of adenopathy. Mild linear opacity at the left lung base consistent with scarring, similar to the prior exam. No evidence of pneumonia or pulmonary edema. No pleural effusion or pneumothorax. Skeletal structures are intact. IMPRESSION: No active cardiopulmonary disease. Electronically Signed   By: Lajean Manes M.D.   On: 05/27/2018 16:32   Ct Angio Chest Pe W And/or Wo Contrast  Result Date: 05/27/2018 CLINICAL DATA:  57 y/o F; shortness of breath, chest pain, dizziness. EXAM: CT ANGIOGRAPHY CHEST WITH CONTRAST TECHNIQUE: Multidetector CT imaging of the chest was performed using the standard protocol during bolus administration of intravenous contrast. Multiplanar CT image reconstructions and MIPs were obtained to evaluate the vascular anatomy. CONTRAST:  100 cc Isovue 370 COMPARISON:  01/26/2018 CT  angiogram chest. FINDINGS: Cardiovascular: Moderate cardiomegaly with prominent right atrial and ventricular enlargement. Normal caliber thoracic aorta. Borderline enlarged main pulmonary artery. Mild coronary artery calcific atherosclerosis. Satisfactory opacification of the pulmonary arteries. Mediastinum/Nodes: No enlarged mediastinal, hilar, or axillary lymph nodes. Thyroid gland, trachea, and esophagus demonstrate no significant findings. Lungs/Pleura: Mild centrilobular emphysema greatest in the upper lobes. Smooth interlobular septal thickening compatible with interstitial edema. Minor atelectasis in the lung bases. No consolidation or pleural effusion. Upper Abdomen: No acute abnormality. Musculoskeletal: No chest wall abnormality. No acute or significant osseous findings. Review of the MIP images confirms the above findings. IMPRESSION: 1. No acute pulmonary embolus identified. 2. Moderate cardiomegaly with prominent right atrial and ventricular enlargement. 3. Interstitial pulmonary edema and mild centrilobular emphysema. Electronically Signed   By: Kristine Garbe M.D.   On: 05/27/2018 20:34     EKG: Independently reviewed.  Sinus rhythm, QTC 387, low voltage, T wave inversion in inferior leads and V3-V5.  Patient had previous EKG on 08/12/2017 showed sinus rhythm, QTC 397, T wave inversion in lead III/aVF and in precordial leads diffusely.   Assessment/Plan Principal Problem:   Chest pain Active Problems:   MGUS (monoclonal gammopathy of unknown significance)   Fibromyalgia   COPD (chronic obstructive pulmonary disease) (H[facility])   Tobacco abuse   History of migraine   Acute on chronic diastolic CHF (congestive heart failure) (H[facility])   OSA (obstructive sleep apnea)   Pulmonary hypertension ([facility])   HLD (hyperlipidemia)   Essential hypertension   Depression   Chest pain: trop 0.43.  Etiology is not clear.  Patient has history of fibromyalgia, which may contribute partially, but  does not explain elevated troponin. CT angiogram is negative for PE.  Patient had fibromyalgia negative stress test recently. R/LHC through [facility] 01/2017 showedd nonobstructive CAD with 20% narrowing in mid RCA and otherwise coronaries normal. Will continue to trend trop. If going up, will consider to start IV heparin and get card consult tonight.  - will place on Tele bed for obs - cycle CE q6 x3 and repeat EKG in the am  - prn Nitroglycerin, Morphine, and aspirin - change zocor to lipitor 80 mg daily - Risk factor stratification: will check FLP,  UDS and A1C  - 2d echo  SOB: Multifactorial etiology, including OSA, COPD, and possible CHF exacerbation.  CT angiogram is negative for PE, but showed interstitial pulmonary edema, which is consistent with possible CHF exacerbation. This is very difficult to assess volume status due to morbid obesity.  Pending BNP -pt received 1 dose of Lasix 60 mg in ED, will reevaluate volume status in morning. -Continue nasal cannula oxygen to maintain oxygen saturation above 93% -Continue bronchodilators.  COPD: no wheezing or rhonchi on auscultation.  No acute exacerbation. -DuoNeb nebulizers, Breo Ellipta inhaler, prn albuerol nebs  Fibromyalgia: -On Neurontin, skelaxin  MGUS (monoclonal gammopathy of unknown significance): following with Dr. Lebron Conners.  No active treatment now. under observation. -Follow-up with Dr. Lebron Conners  Tobacco abuse: Patient states that she quit smoking 2 weeks ago, she does not need nicotine patch today. -encouraged to not restart smoking  History of migraine: -prn sumatriptan  Acute on chronic diastolic CHF and Pulmonary hypertension: Difficult to assess volume status due to morbid obesity.  Pending BMP.  CT angiogram showed interstitial pulmonary edema.  Patient has shortness of breath, indicating possible CHF exacerbation. -Received 1 dose of Lasix-60 mg in ED, need to reevaluate volume status in the morning - Follow-up 2D  echo  OSA (obstructive sleep apnea): -CPAP  HLD: -lipitor  Essential hypertension: -Amlodipine - hydralazine IV prn  Depression and anxiety -BuSpar, Cymbalta, Topamax   DVT ppx: SQ Lovenox Code Status: Full code Family Communication:   Yes, patient's husband at bed side Disposition Plan:  Anticipate discharge back to previous home environment Consults called:  none Admission status: Obs / tele     Date of Service 05/27/2018    Ivor Costa [facility] Pager [phone]  If 7PM-7AM, please contact night-coverage [email or web address] Password [facility] 05/27/2018, 11:12 PM

## 2018-05-27 NOTE — ED Provider Notes (Signed)
[facility] EMERGENCY DEPARTMENT Provider Note   CSN: [id number] Arrival date & time: 05/27/18  1552     History   Chief Complaint Chief Complaint  Patient presents with  . Shortness of Breath    HPI Teresa Roberts is a 57 y.o. female.  HPI   30yF with CP and dyspnea. Hx of fibromyalgia and says she hurts all over but this CP is different. She has a hard to describing it beyond that. She also feels SOB and dizzy. No fever or chills. No cough. No unusual leg pain or swelling. Reports recent [place] after she had a syncopal event on airplane.   Past Medical History:  Diagnosis Date  . COPD (chronic obstructive pulmonary disease) ([place])    O2 dependent  . cpap   . Depression    PTSD  . Diastolic CHF ([place])   . Fibromyalgia   . Hyperlipidemia   . Hypertension   . Migraines     Patient Active Problem List   Diagnosis Date Noted  . OSA (obstructive sleep apnea) 05/03/2018  . Pulmonary hypertension ([place]) 05/03/2018  . Bimalleolar fracture of left ankle 08/12/2017  . Acute renal failure (ARF) ([place]) 08/09/2017  . Acute on chronic diastolic CHF (congestive heart failure) ([place]) 08/09/2017  . Acute metabolic encephalopathy 40/98/1191  . Other fatigue 06/13/2017  . Primary insomnia 06/13/2017  . History of migraine 06/13/2017  . COPD (chronic obstructive pulmonary disease) ([place]) 02/15/2017  . Tobacco abuse 02/15/2017  . Respiratory failure with hypoxia ([place]) 01/28/2017  . Migraine 01/28/2017  . Daily headache   . Hypoxia   . Shortness of breath 01/26/2017  . Non-ST elevation myocardial infarction (NSTEMI), type 2 01/26/2017  . MGUS (monoclonal gammopathy of unknown significance) 01/11/2012  . Fibromyalgia 01/11/2012  . Bruises easily 01/11/2012    Past Surgical History:  Procedure Laterality Date  . APPENDECTOMY    . CESAREAN SECTION    . ORIF ANKLE FRACTURE Left 08/12/2017   Procedure: OPEN REDUCTION INTERNAL FIXATION (ORIF) ANKLE  FRACTURE;  Surgeon: Rod Can, MD;  Location: [facility];  Service: Orthopedics;  Laterality: Left;  . RIGHT/LEFT HEART CATH AND CORONARY ANGIOGRAPHY N/A 01/27/2017   Procedure: Right/Left Heart Cath and Coronary Angiography;  Surgeon: Troy Sine, MD;  Location: [facility];  Service: Cardiovascular;  Laterality: N/A;     OB History   None      Home Medications    Prior to Admission medications   Medication Sig Start Date End Date Taking? Authorizing Provider  amLODipine (NORVASC) 2.5 MG tablet Take 2.5 mg by mouth daily. 05/11/18  Yes [provider]  Artificial Tear Solution (JUST TEARS EYE DROPS) SOLN Place 1 drop into both eyes 2 (two) times daily as needed (dry eyes).   Yes [provider]  aspirin 81 MG EC tablet Take 81 mg by mouth daily after lunch. 05/11/18  Yes [provider]  busPIRone (BUSPAR) 15 MG tablet Take 15 mg by mouth 2 (two) times daily.   Yes [provider]  butalbital-acetaminophen-caffeine (FIORICET, ESGIC) 50-325-40 MG tablet Take 1 tablet by mouth 2 (two) times daily as needed for headache.   Yes [provider]  docusate sodium (COLACE) 100 MG capsule Take 100 mg by mouth 3 (three) times daily.   Yes [provider]  DULoxetine (CYMBALTA) 30 MG capsule take 60mg  capsule once daily at bedtime Patient taking differently: Take 30 mg by mouth daily.  02/21/18  Yes Deveshwar,  Abel Presto, MD  Fluticasone-Umeclidin-Vilant (TRELEGY ELLIPTA) 100-62.5-25 MCG/INH AEPB Inhale 1 puff into the lungs daily. 05/03/18  Yes Parrett, Fonnie Mu, NP  folic acid (FOLVITE) 1 MG tablet Take 1 mg by mouth daily after lunch. 05/11/18  Yes [provider]  gabapentin (NEURONTIN) 800 MG tablet Take 800 mg by mouth 2 (two) times daily.   Yes [provider]  latanoprost (XALATAN) 0.005 % ophthalmic solution Place 1 drop into both eyes at bedtime.   Yes [provider]  metaxalone (SKELAXIN) 800 MG tablet TAKE 1  TABLET (800 MG TOTAL) BY MOUTH 3 (THREE) TIMES DAILY AS NEEDED FOR MUSCLE SPASMS. 05/22/18  Yes Deveshwar, Abel Presto, MD  modafinil (PROVIGIL) 100 MG tablet Take 100 mg by mouth daily. 04/24/18  Yes [provider]  Multiple Vitamins-Minerals (HAIR/SKIN/NAILS/BIOTIN) TABS Take 1 tablet by mouth daily after lunch.   Yes [provider]  OXYGEN Inhale 4 L into the lungs continuous.   Yes [provider]  Probiotic Product (PROBIOTIC PO) Take 1 tablet by mouth daily.   Yes [provider]  rizatriptan (MAXALT-MLT) 10 MG disintegrating tablet Take 10 mg by mouth as needed for migraine. May repeat in 2 hours if needed   Yes [provider]  simvastatin (ZOCOR) 10 MG tablet Take 10 mg by mouth at bedtime.   Yes [provider]  topiramate (TOPAMAX) 25 MG tablet Take 50 mg by mouth daily.    Yes [provider]  traZODone (DESYREL) 100 MG tablet Take 100 mg by mouth at bedtime.    Yes [provider]  vitamin B-12 (CYANOCOBALAMIN) 1000 MCG tablet Take 1,000 mcg by mouth daily after lunch.   Yes [provider]  aspirin 325 MG EC tablet Take 1 tablet (325 mg total) by mouth 2 (two) times daily. Patient not taking: Reported on 05/27/2018 08/13/17   Aline August, MD  Fluticasone-Umeclidin-Vilant (TRELEGY ELLIPTA) 100-62.5-25 MCG/INH AEPB Inhale 1 puff into the lungs daily. Patient not taking: Reported on 05/27/2018 05/03/18   Parrett, Fonnie Mu, NP  hydrochlorothiazide (HYDRODIURIL) 25 MG tablet Take 1 tablet (25 mg total) by mouth daily. Patient not taking: Reported on 05/27/2018 01/29/17   Holley Raring, MD    Family History Family History  Problem Relation Age of Onset  . Diabetes Mother   . Hypertension Mother   . CAD Mother   . Diabetes Father   . Lung cancer Father   . Diabetes Sister     Social History Social History   Tobacco Use  . Smoking status: Current Every Day Smoker    Packs/day: 1.00    Years: 39.00     Pack years: 39.00    Types: Cigarettes  . Smokeless tobacco: Never Used  . Tobacco comment: down to 0.5ppd 06/22/17  Substance Use Topics  . Alcohol use: No    Frequency: Never  . Drug use: No     Allergies   Citrus and Lisinopril   Review of Systems Review of Systems  All systems reviewed and negative, other than as noted in HPI.  Physical Exam Updated Vital Signs BP (!) 145/96   Pulse 82   Temp 98.8 F (37.1 C) (Oral)   Resp 15   Ht 5\' 4"  (1.626 m)   Wt 86.2 kg (190 lb)   LMP 08/19/2012   SpO2 94%   BMI 32.61 kg/m   Physical Exam  Constitutional: She appears well-developed and well-nourished. No distress.  Laying in bed. NAD. Obese.   HENT:  Head: Normocephalic and atraumatic.  Eyes: Conjunctivae are normal. Right eye exhibits no discharge. Left eye exhibits no discharge.  Neck: Neck supple.  Cardiovascular: Normal rate, regular rhythm and normal heart sounds. Exam reveals no gallop and no friction rub.  No murmur heard. Pulmonary/Chest: Effort normal and breath sounds normal. No respiratory distress.  Abdominal: Soft. She exhibits no distension. There is no tenderness.  Musculoskeletal: She exhibits no edema or tenderness.  Lower extremities symmetric as compared to each other. No calf tenderness. Negative Homan's. No palpable cords.   Neurological: She is alert.  Skin: Skin is warm and dry.  Psychiatric: She has a normal mood and affect. Her behavior is normal. Thought content normal.  Nursing note and vitals reviewed.    ED Treatments / Results  Labs (all labs ordered are listed, but only abnormal results are displayed) Labs Reviewed  CBC - Abnormal; Notable for the following components:      Result Value   RBC 5.23 (*)    Hemoglobin 10.7 (*)    MCV 72.5 (*)    MCH 20.5 (*)    MCHC 28.2 (*)    RDW 22.4 (*)    Platelets 124 (*)    All other components within normal limits  I-STAT TROPONIN, ED - Abnormal; Notable for the following components:    Troponin i, poc 0.43 (*)    All other components within normal limits  BASIC METABOLIC PANEL  I-STAT BETA HCG BLOOD, ED ([facility], [facility], [facility] ONLY)    EKG EKG Interpretation  Date/Time:  Saturday May 27 2018 15:59:56 EDT Ventricular Rate:  80 PR Interval:  156 QRS Duration: 82 QT Interval:  336 QTC Calculation: 387 R Axis:   88 Text Interpretation:  Normal sinus rhythm Possible Left atrial enlargement ST & T wave abnormality, consider inferior ischemia ST & T wave abnormality, consider anterolateral ischemia Abnormal ECG similar to previous tracings Confirmed by Virgel Manifold [phone]) on 05/27/2018 5:18:43 PM   Radiology Dg Chest 2 View  Result Date: 05/27/2018 CLINICAL DATA:  Patient from home, is c/o of sob with generalized pain. Chest pain, and dizziness present EXAM: CHEST - 2 VIEW COMPARISON:  08/11/2017 and older studies. FINDINGS: Cardiac silhouette is mildly enlarged. No mediastinal or hilar masses. No evidence of adenopathy. Mild linear opacity at the left lung base consistent with scarring, similar to the prior exam. No evidence of pneumonia or pulmonary edema. No pleural effusion or pneumothorax. Skeletal structures are intact. IMPRESSION: No active cardiopulmonary disease. Electronically Signed   By: Lajean Manes M.D.   On: 05/27/2018 16:32   Ct Angio Chest Pe W And/or Wo Contrast  Result Date: 05/27/2018 CLINICAL DATA:  57 y/o F; shortness of breath, chest pain, dizziness. EXAM: CT ANGIOGRAPHY CHEST WITH CONTRAST TECHNIQUE: Multidetector CT imaging of the chest was performed using the standard protocol during bolus administration of intravenous contrast. Multiplanar CT image reconstructions and MIPs were obtained to evaluate the vascular anatomy. CONTRAST:  100 cc Isovue 370 COMPARISON:  01/26/2018 CT angiogram chest. FINDINGS: Cardiovascular: Moderate cardiomegaly with prominent right atrial and ventricular enlargement. Normal caliber thoracic aorta. Borderline enlarged main pulmonary  artery. Mild coronary artery calcific atherosclerosis. Satisfactory opacification of the pulmonary arteries. Mediastinum/Nodes: No enlarged mediastinal, hilar, or axillary lymph nodes. Thyroid gland, trachea, and esophagus demonstrate no significant findings. Lungs/Pleura: Mild centrilobular emphysema greatest in the upper lobes. Smooth interlobular septal thickening compatible with interstitial edema. Minor atelectasis in the lung bases. No consolidation or pleural effusion. Upper Abdomen: No acute abnormality.  Musculoskeletal: No chest wall abnormality. No acute or significant osseous findings. Review of the MIP images confirms the above findings. IMPRESSION: 1. No acute pulmonary embolus identified. 2. Moderate cardiomegaly with prominent right atrial and ventricular enlargement. 3. Interstitial pulmonary edema and mild centrilobular emphysema. Electronically Signed   By: Kristine Garbe M.D.   On: 05/27/2018 20:34    Procedures Procedures (including critical care time)  Medications Ordered in ED Medications  iopamidol (ISOVUE-370) 76 % injection (has no administration in time range)  morphine 4 MG/ML injection 6 mg (has no administration in time range)  furosemide (LASIX) injection 60 mg (has no administration in time range)  iopamidol (ISOVUE-370) 76 % injection 100 mL (100 mLs Intravenous Contrast Given 05/27/18 1920)     Initial Impression / Assessment and Plan / ED Course  I have reviewed the triage vital signs and the nursing notes.  Pertinent labs & imaging results that were available during my care of the patient were reviewed by me and considered in my medical decision making (see chart for details).     57yF with dyspnea. Troponin is elevated. She endorses CP but says she has pain all over which she attributes to fibromyalgia.  Seems atypical for ACS. EKG abnormal but similar to priors.   She actually was admitted to a [facility] facility in [place] at the end of June  after a syncopal event. I do not have any notes available she brought a copy of her labs/testing.  Her initial troponin at that time was 0.994 and she was admitted.  She had a Lexiscan stress test which showed normal myocardial perfusion scan with no evidence of infarction or inducible ischemia. Normal wall motion with EF of 66%.  She had a VQ scan which was very low probability for PE.  She had an echocardiogram which showed normal EF, grade 1 diastolic dysfunction, severely elevated RV systolic pressures, moderate to severe tricuspid regurg and mild pulmonic regurg. Carotid dopplers w/o hemodynamically significant stenosis.   She had R/[facility]HC 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   Final Clinical Impressions(s) / ED Diagnoses   Final diagnoses:  Chest pain, unspecified type  Elevated troponin    ED Discharge Orders    None       Virgel Manifold, MD 06/06/18 1820

## 2018-05-27 NOTE — ED Notes (Signed)
Consult to IV team made.

## 2018-05-28 ENCOUNTER — Observation Stay (HOSPITAL_BASED_OUTPATIENT_CLINIC_OR_DEPARTMENT_OTHER): Payer: 59

## 2018-05-28 ENCOUNTER — Encounter (HOSPITAL_COMMUNITY): Payer: Self-pay | Admitting: Student

## 2018-05-28 DIAGNOSIS — Z79899 Other long term (current) drug therapy: Secondary | ICD-10-CM | POA: Diagnosis not present

## 2018-05-28 DIAGNOSIS — I2510 Atherosclerotic heart disease of native coronary artery without angina pectoris: Secondary | ICD-10-CM | POA: Diagnosis present

## 2018-05-28 DIAGNOSIS — I5033 Acute on chronic diastolic (congestive) heart failure: Secondary | ICD-10-CM | POA: Diagnosis not present

## 2018-05-28 DIAGNOSIS — I071 Rheumatic tricuspid insufficiency: Secondary | ICD-10-CM | POA: Diagnosis present

## 2018-05-28 DIAGNOSIS — Z7982 Long term (current) use of aspirin: Secondary | ICD-10-CM | POA: Diagnosis not present

## 2018-05-28 DIAGNOSIS — Z833 Family history of diabetes mellitus: Secondary | ICD-10-CM | POA: Diagnosis not present

## 2018-05-28 DIAGNOSIS — E785 Hyperlipidemia, unspecified: Secondary | ICD-10-CM | POA: Diagnosis present

## 2018-05-28 DIAGNOSIS — Z8249 Family history of ischemic heart disease and other diseases of the circulatory system: Secondary | ICD-10-CM | POA: Diagnosis not present

## 2018-05-28 DIAGNOSIS — M797 Fibromyalgia: Secondary | ICD-10-CM | POA: Diagnosis present

## 2018-05-28 DIAGNOSIS — D472 Monoclonal gammopathy: Secondary | ICD-10-CM | POA: Diagnosis present

## 2018-05-28 DIAGNOSIS — F17210 Nicotine dependence, cigarettes, uncomplicated: Secondary | ICD-10-CM | POA: Diagnosis present

## 2018-05-28 DIAGNOSIS — I361 Nonrheumatic tricuspid (valve) insufficiency: Secondary | ICD-10-CM

## 2018-05-28 DIAGNOSIS — G4733 Obstructive sleep apnea (adult) (pediatric): Secondary | ICD-10-CM | POA: Diagnosis present

## 2018-05-28 DIAGNOSIS — J9611 Chronic respiratory failure with hypoxia: Secondary | ICD-10-CM | POA: Diagnosis not present

## 2018-05-28 DIAGNOSIS — I110 Hypertensive heart disease with heart failure: Secondary | ICD-10-CM | POA: Diagnosis not present

## 2018-05-28 DIAGNOSIS — R0789 Other chest pain: Secondary | ICD-10-CM | POA: Diagnosis present

## 2018-05-28 DIAGNOSIS — R072 Precordial pain: Secondary | ICD-10-CM | POA: Diagnosis not present

## 2018-05-28 DIAGNOSIS — F329 Major depressive disorder, single episode, unspecified: Secondary | ICD-10-CM | POA: Diagnosis present

## 2018-05-28 DIAGNOSIS — I10 Essential (primary) hypertension: Secondary | ICD-10-CM

## 2018-05-28 DIAGNOSIS — F4310 Post-traumatic stress disorder, unspecified: Secondary | ICD-10-CM | POA: Diagnosis present

## 2018-05-28 DIAGNOSIS — Z91018 Allergy to other foods: Secondary | ICD-10-CM | POA: Diagnosis not present

## 2018-05-28 DIAGNOSIS — R079 Chest pain, unspecified: Secondary | ICD-10-CM | POA: Diagnosis not present

## 2018-05-28 DIAGNOSIS — I2729 Other secondary pulmonary hypertension: Secondary | ICD-10-CM | POA: Diagnosis present

## 2018-05-28 DIAGNOSIS — J441 Chronic obstructive pulmonary disease with (acute) exacerbation: Secondary | ICD-10-CM | POA: Diagnosis not present

## 2018-05-28 DIAGNOSIS — Z801 Family history of malignant neoplasm of trachea, bronchus and lung: Secondary | ICD-10-CM | POA: Diagnosis not present

## 2018-05-28 DIAGNOSIS — I272 Pulmonary hypertension, unspecified: Secondary | ICD-10-CM

## 2018-05-28 DIAGNOSIS — Z9981 Dependence on supplemental oxygen: Secondary | ICD-10-CM | POA: Diagnosis not present

## 2018-05-28 DIAGNOSIS — Z888 Allergy status to other drugs, medicaments and biological substances status: Secondary | ICD-10-CM | POA: Diagnosis not present

## 2018-05-28 DIAGNOSIS — Z6834 Body mass index (BMI) 34.0-34.9, adult: Secondary | ICD-10-CM | POA: Diagnosis not present

## 2018-05-28 DIAGNOSIS — J449 Chronic obstructive pulmonary disease, unspecified: Secondary | ICD-10-CM | POA: Diagnosis not present

## 2018-05-28 LAB — LIPID PANEL
Cholesterol: 144 mg/dL (ref 0–200)
HDL: 31 mg/dL — AB (ref >40)
LDL CALC: 84 mg/dL (ref 0–99)
TRIGLYCERIDES: 147 mg/dL (ref <150)
Total CHOL/HDL Ratio: 4.6 RATIO
VLDL: 29 mg/dL (ref 0–40)

## 2018-05-28 LAB — ECHOCARDIOGRAM COMPLETE
Height: 64 in
Weight: 3209.6 oz

## 2018-05-28 LAB — HEMOGLOBIN A1C
Hgb A1c MFr Bld: 6.3 % — ABNORMAL HIGH (ref 4.8–5.6)
Mean Plasma Glucose: 134.11 mg/dL

## 2018-05-28 LAB — RAPID URINE DRUG SCREEN, HOSP PERFORMED
AMPHETAMINES: NOT DETECTED
BENZODIAZEPINES: NOT DETECTED
COCAINE: NOT DETECTED
Opiates: POSITIVE — AB
Tetrahydrocannabinol: NOT DETECTED

## 2018-05-28 LAB — TROPONIN I
Troponin I: 0.27 ng/mL (ref <0.03)
Troponin I: 0.32 ng/mL (ref <0.03)
Troponin I: 0.36 ng/mL (ref <0.03)

## 2018-05-28 LAB — BRAIN NATRIURETIC PEPTIDE: B NATRIURETIC PEPTIDE 5: 510 pg/mL — AB (ref 0.0–100.0)

## 2018-05-28 MED ORDER — FUROSEMIDE 10 MG/ML IJ SOLN
40.0000 mg | Freq: Two times a day (BID) | INTRAMUSCULAR | Status: DC
  Administered 2018-05-28 – 2018-05-29 (×3): 40 mg via INTRAVENOUS
  Filled 2018-05-28 (×3): qty 4

## 2018-05-28 NOTE — Progress Notes (Signed)
RT asked about cpap, pt states her husband is bringing hers from home to wear tonight. Pt states she can place self on cpap when ready for bed.

## 2018-05-28 NOTE — Progress Notes (Signed)
  Echocardiogram 2D Echocardiogram has been performed.  Teresa Roberts 05/28/2018, 11:07 AM

## 2018-05-28 NOTE — Progress Notes (Signed)
[facility]  PROGRESS NOTE  Teresa Roberts LKT:[id number] DOB: 05-17-1961 DOA: 05/27/2018 PCP: Jonathon Jordan, MD   Brief HPI:   57 y.o. female with medical history significant of fibromyalgia, hypertension, hyperlipidemia, COPD on 4 L oxygen at home, depression, dCHF, pulmonary hypertension, migraine headache, obesity, OSA on CPAP, former smoker (quit smoking 2 weeks ago), who presents with chest pain and shortness of breath.   Subjective   Patient seen and examined, denies chest pain or shortness of breath.   Assessment/Plan:     1. Chest pain-with patient has troponin up to 0.32, cardiology was consulted and does not feel that patient has ischemic cardiomyopathy.  She had cardiac cath a year ago which showed nonobstructive disease.  No further work-up as per cardiology. 2. Acute on chronic diastolic CHF-patient started on furosemide 40 mg IV every 12.  Good diuresis.  Net -203 0 mL.  Strict intake and output.  Follow BMP in am. 3. Hypertension-blood pressure stable, continue Norvasc. 4. Fibromyalgia-stable, continue Neurontin, Skelaxin, Topamax, trazodone 5. MGUS-follows oncology as outpatient. 6.     DVT prophylaxis: Lovenox  Code Status: Full code  Family Communication: No family at bedside  Disposition Plan: likely home when medically ready for discharge   Consultants:  Cardiology  Procedures:  None   Antibiotics:   Anti-infectives (From admission, onward)   None       Objective   Vitals:   05/27/18 2308 05/27/18 2350 05/28/18 0500 05/28/18 1220  BP: (!) 98/52 106/68 115/76 115/75  Pulse:  77 73 80  Resp:   18 20  Temp:   98.5 F (36.9 C) 97.7 F (36.5 C)  TempSrc: Oral  Oral Oral  SpO2:   100% 95%  Weight: 93 kg (205 lb 0.4 oz)  91 kg (200 lb 9.6 oz)   Height: 5\' 4"  (1.626 m)       Intake/Output Summary (Last 24 hours) at 05/28/2018 1301 Last data filed at 05/28/2018 0900 Gross per 24 hour  Intake 120 ml  Output 2150 ml  Net -2030 ml    Filed Weights   05/27/18 1602 05/27/18 2308 05/28/18 0500  Weight: 86.2 kg (190 lb) 93 kg (205 lb 0.4 oz) 91 kg (200 lb 9.6 oz)     Physical Examination:    General:  Appears in no acute distress S1-S2, regular  Cardiovascular:   Respiratory: Bibasilar crackles  Abdomen: Soft, not, no organomegaly  Extremities: *No edema noted in the lower extremities  Neurologic:  Alert, oriented x3     Data Reviewed: I have personally reviewed following labs and imaging studies  CBG: No results for input(s): GLUCAP in the last 168 hours.  CBC: Recent Labs  Lab 05/27/18 1618  WBC 7.9  HGB 10.7*  HCT 37.9  MCV 72.5*  PLT 124*    Basic Metabolic Panel: Recent Labs  Lab 05/27/18 1618  NA 139  K 4.3  CL 103  CO2 26  GLUCOSE 93  BUN 13  CREATININE 1.00  CALCIUM 10.0    No results found for this or any previous visit (from the past 240 hour(s)).   Liver Function Tests: No results for input(s): AST, ALT, ALKPHOS, BILITOT, PROT, ALBUMIN in the last 168 hours. No results for input(s): LIPASE, AMYLASE in the last 168 hours. No results for input(s): AMMONIA in the last 168 hours.  Cardiac Enzymes: Recent Labs  Lab 05/27/18 2347 05/28/18 0629 05/28/18 1201  TROPONINI 0.27* 0.36* 0.32*   BNP (last 3 results) Recent Labs  08/09/17 0956 05/27/18 2347  BNP 2,139.4* 510.0*    ProBNP (last 3 results) No results for input(s): PROBNP in the last 8760 hours.    Studies: Dg Chest 2 View  Result Date: 05/27/2018 CLINICAL DATA:  Patient from home, is c/o of sob with generalized pain. Chest pain, and dizziness present EXAM: CHEST - 2 VIEW COMPARISON:  08/11/2017 and older studies. FINDINGS: Cardiac silhouette is mildly enlarged. No mediastinal or hilar masses. No evidence of adenopathy. Mild linear opacity at the left lung base consistent with scarring, similar to the prior exam. No evidence of pneumonia or pulmonary edema. No pleural effusion or pneumothorax.  Skeletal structures are intact. IMPRESSION: No active cardiopulmonary disease. Electronically Signed   By: Lajean Manes M.D.   On: 05/27/2018 16:32   Ct Angio Chest Pe W And/or Wo Contrast  Result Date: 05/27/2018 CLINICAL DATA:  57 y/o F; shortness of breath, chest pain, dizziness. EXAM: CT ANGIOGRAPHY CHEST WITH CONTRAST TECHNIQUE: Multidetector CT imaging of the chest was performed using the standard protocol during bolus administration of intravenous contrast. Multiplanar CT image reconstructions and MIPs were obtained to evaluate the vascular anatomy. CONTRAST:  100 cc Isovue 370 COMPARISON:  01/26/2018 CT angiogram chest. FINDINGS: Cardiovascular: Moderate cardiomegaly with prominent right atrial and ventricular enlargement. Normal caliber thoracic aorta. Borderline enlarged main pulmonary artery. Mild coronary artery calcific atherosclerosis. Satisfactory opacification of the pulmonary arteries. Mediastinum/Nodes: No enlarged mediastinal, hilar, or axillary lymph nodes. Thyroid gland, trachea, and esophagus demonstrate no significant findings. Lungs/Pleura: Mild centrilobular emphysema greatest in the upper lobes. Smooth interlobular septal thickening compatible with interstitial edema. Minor atelectasis in the lung bases. No consolidation or pleural effusion. Upper Abdomen: No acute abnormality. Musculoskeletal: No chest wall abnormality. No acute or significant osseous findings. Review of the MIP images confirms the above findings. IMPRESSION: 1. No acute pulmonary embolus identified. 2. Moderate cardiomegaly with prominent right atrial and ventricular enlargement. 3. Interstitial pulmonary edema and mild centrilobular emphysema. Electronically Signed   By: Kristine Garbe M.D.   On: 05/27/2018 20:34    Scheduled Meds: . amLODipine  2.5 mg Oral Daily  . aspirin EC  325 mg Oral QPC lunch  . atorvastatin  80 mg Oral q1800  . busPIRone  15 mg Oral BID  . docusate sodium  100 mg Oral TID   . DULoxetine  30 mg Oral Daily  . enoxaparin (LOVENOX) injection  40 mg Subcutaneous Q24H  . fluticasone furoate-vilanterol  1 puff Inhalation Daily   And  . umeclidinium bromide  1 puff Inhalation Daily  . folic acid  1 mg Oral QPC lunch  . furosemide  40 mg Intravenous BID  . gabapentin  800 mg Oral BID  . ipratropium-albuterol  3 mL Nebulization TID  . latanoprost  1 drop Both Eyes QHS  . modafinil  100 mg Oral Daily  . saccharomyces boulardii  250 mg Oral Daily  . topiramate  50 mg Oral Daily  . traZODone  100 mg Oral QHS  . vitamin B-12  1,000 mcg Oral QPC lunch      Time spent: 25 min  [facility] Pager [phone]. If 7PM-7AM, please contact night-coverage at [email or web address], Office  [phone]  password [email or web address]  05/28/2018, 1:01 PM  LOS: 0 days

## 2018-05-28 NOTE — Progress Notes (Signed)
Troponin 0.27 no s/s, MD notified and aware, will continue to monitor, Thanks Arvella Nigh RN.

## 2018-05-28 NOTE — Progress Notes (Signed)
Troponin 0.36, was 0.27, no s/s, MD notified, will continue to monitor, THanks Arvella Nigh RN.

## 2018-05-28 NOTE — Consult Note (Addendum)
Cardiology Consult    Patient ID: [facility]; [id number]; 21-May-1961   Admit date: 05/27/2018 Date of Consult: 05/28/2018  Primary Care Provider: Jonathon Jordan, MD Primary Cardiologist: Jenkins Rouge, MD (evaluated during admission in 01/2017 --> no outpatient follow-up)  Patient Profile    Teresa [facility] is a 57 y.o. female with past medical history of chronic diastolic CHF, CAD (nonobstructive by cath in 01/2017), COPD, chronic hypoxic respiratory failure (on 4L [place] at baseline), OSA, pulmonary HTN, HTN, HLD, fibromyalgia and tobacco use who is being seen today for the evaluation of chest pain and elevated troponin at the request of Dr. Blaine Hamper.   History of Present Illness    Teresa Roberts was first evaluated by the cardiology service in 01/2017 during an admission for atypical chest pain and CHF. She underwent a right and left heart catheterization at that time which showed 20% mid RCA stenosis and otherwise normal LAD and LCx with a preserved EF of 55 to 60% and moderate right-sided pressures consistent with moderate pulmonary hypertension. Continued medical management was recommended.  Was again admitted in 07/2017 for sepsis which was thought to be multifactorial in the setting of UTI and CAP. Cardiology was consulted as troponin values peaked at 1.67. This was thought to be secondary to demand ischemia given her flat trend and a repeat echocardiogram was performed and showed a preserved EF of 55 to 60% with no regional wall motion abnormalities.  She presented to [facility] on 05/27/2018 for evaluation of worsening dyspnea and chest discomfort. Reports that she had been admitted at an outside hospital in [place] two weeks ago for a syncopal event. By review of her H&P this admission, troponin values peaked at 0.9 during that admission and a Myoview stress test was performed which showed no evidence of infarct or ischemia. She reports that starting yesterday she developed worsening dyspnea and  had a discomfort along her sternal region which was worse when sitting up. Was not worse with exertion or after consuming food. Reports that her discomfort would last for 1 to 2 hours at a time and spontaneously resolve. She reports baseline orthopnea but denies any recent changes in this. No PND or lower extremity edema. No recent palpitations. Does not weigh herself regularly but feels like this has been stable.   Initial labs show WBC 7.9, Hgb 10.7, platelets 124, Na+ 139, K+ 4.3, and creatinine 1.00.  Initial troponin 0.43 with repeat values at 0.27 and 0.36. BNP elevated to 510.  UDS positive for opiates.  CXR shows no active cardiopulmonary disease. CTA shows no evidence of an acute PE.  She is noted to have moderate cardiomegaly with prominent right atrial and ventricular enlargement with interstitial pulmonary edema.  EKG shows normal sinus rhythm, heart rate 77, with diffuse T wave inversion along the inferior and anterior leads which is similar to prior tracings in 2018.   Past Medical History:  Diagnosis Date  . CAD (coronary artery disease)    a. 01/2017: cath showing 20% mid RCA stenosis and otherwise normal LAD and LCx with a preserved EF of 55 to 60%; mod pulmonary HTN  . COPD (chronic obstructive pulmonary disease) ([place])    O2 dependent  . cpap   . Depression    PTSD  . Diastolic CHF ([place])   . Fibromyalgia   . Hyperlipidemia   . Hypertension   . Migraines     Past Surgical History:  Procedure Laterality Date  . APPENDECTOMY    .  CESAREAN SECTION    . ORIF ANKLE FRACTURE Left 08/12/2017   Procedure: OPEN REDUCTION INTERNAL FIXATION (ORIF) ANKLE FRACTURE;  Surgeon: Rod Can, MD;  Location: [facility];  Service: Orthopedics;  Laterality: Left;  . RIGHT/LEFT HEART CATH AND CORONARY ANGIOGRAPHY N/A 01/27/2017   Procedure: Right/Left Heart Cath and Coronary Angiography;  Surgeon: Troy Sine, MD;  Location: [facility];  Service: Cardiovascular;  Laterality: N/A;      Home Medications:  Prior to Admission medications   Medication Sig Start Date End Date Taking? Authorizing Provider  amLODipine (NORVASC) 2.5 MG tablet Take 2.5 mg by mouth daily. 05/11/18  Yes [provider]  Artificial Tear Solution (JUST TEARS EYE DROPS) SOLN Place 1 drop into both eyes 2 (two) times daily as needed (dry eyes).   Yes [provider]  aspirin 81 MG EC tablet Take 81 mg by mouth daily after lunch. 05/11/18  Yes [provider]  busPIRone (BUSPAR) 15 MG tablet Take 15 mg by mouth 2 (two) times daily.   Yes [provider]  butalbital-acetaminophen-caffeine (FIORICET, ESGIC) 50-325-40 MG tablet Take 1 tablet by mouth 2 (two) times daily as needed for headache.   Yes [provider]  docusate sodium (COLACE) 100 MG capsule Take 100 mg by mouth 3 (three) times daily.   Yes [provider]  DULoxetine (CYMBALTA) 30 MG capsule take 60mg  capsule once daily at bedtime Patient taking differently: Take 30 mg by mouth daily.  02/21/18  Yes Deveshwar, Abel Presto, MD  Fluticasone-Umeclidin-Vilant (TRELEGY ELLIPTA) 100-62.5-25 MCG/INH AEPB Inhale 1 puff into the lungs daily. 05/03/18  Yes Parrett, Fonnie Mu, NP  folic acid (FOLVITE) 1 MG tablet Take 1 mg by mouth daily after lunch. 05/11/18  Yes [provider]  gabapentin (NEURONTIN) 800 MG tablet Take 800 mg by mouth 2 (two) times daily.   Yes [provider]  latanoprost (XALATAN) 0.005 % ophthalmic solution Place 1 drop into both eyes at bedtime.   Yes [provider]  metaxalone (SKELAXIN) 800 MG tablet TAKE 1 TABLET (800 MG TOTAL) BY MOUTH 3 (THREE) TIMES DAILY AS NEEDED FOR MUSCLE SPASMS. 05/22/18  Yes Deveshwar, Abel Presto, MD  modafinil (PROVIGIL) 100 MG tablet Take 100 mg by mouth daily. 04/24/18  Yes [provider]  Multiple Vitamins-Minerals (HAIR/SKIN/NAILS/BIOTIN) TABS Take 1 tablet by mouth daily after lunch.   Yes [provider]  OXYGEN Inhale  4 L into the lungs continuous.   Yes [provider]  Probiotic Product (PROBIOTIC PO) Take 1 tablet by mouth daily.   Yes [provider]  rizatriptan (MAXALT-MLT) 10 MG disintegrating tablet Take 10 mg by mouth as needed for migraine. May repeat in 2 hours if needed   Yes [provider]  simvastatin (ZOCOR) 10 MG tablet Take 10 mg by mouth at bedtime.   Yes [provider]  topiramate (TOPAMAX) 25 MG tablet Take 50 mg by mouth daily.    Yes [provider]  traZODone (DESYREL) 100 MG tablet Take 100 mg by mouth at bedtime.    Yes [provider]  vitamin B-12 (CYANOCOBALAMIN) 1000 MCG tablet Take 1,000 mcg by mouth daily after lunch.   Yes [provider]  aspirin 325 MG EC tablet Take 1 tablet (325 mg total) by mouth 2 (two) times daily. Patient not taking: Reported on 05/27/2018 08/13/17   Aline August, MD  Fluticasone-Umeclidin-Vilant (TRELEGY ELLIPTA) 100-62.5-25 MCG/INH AEPB Inhale 1 puff into the lungs daily. Patient not taking: Reported  on 05/27/2018 05/03/18   Parrett, Fonnie Mu, NP  hydrochlorothiazide (HYDRODIURIL) 25 MG tablet Take 1 tablet (25 mg total) by mouth daily. Patient not taking: Reported on 05/27/2018 01/29/17   Holley Raring, MD    Inpatient Medications: Scheduled Meds: . amLODipine  2.5 mg Oral Daily  . aspirin EC  325 mg Oral QPC lunch  . atorvastatin  80 mg Oral q1800  . busPIRone  15 mg Oral BID  . docusate sodium  100 mg Oral TID  . DULoxetine  30 mg Oral Daily  . enoxaparin (LOVENOX) injection  40 mg Subcutaneous Q24H  . fluticasone furoate-vilanterol  1 puff Inhalation Daily   And  . umeclidinium bromide  1 puff Inhalation Daily  . folic acid  1 mg Oral QPC lunch  . furosemide  40 mg Intravenous BID  . gabapentin  800 mg Oral BID  . ipratropium-albuterol  3 mL Nebulization TID  . latanoprost  1 drop Both Eyes QHS  . modafinil  100 mg Oral Daily  . saccharomyces boulardii  250 mg Oral Daily  .  topiramate  50 mg Oral Daily  . traZODone  100 mg Oral QHS  . vitamin B-12  1,000 mcg Oral QPC lunch   Continuous Infusions:  PRN Meds: acetaminophen, albuterol, butalbital-acetaminophen-caffeine, hydrALAZINE, metaxalone, morphine injection, nitroGLYCERIN, ondansetron (ZOFRAN) IV, polyvinyl alcohol, SUMAtriptan, zolpidem  Allergies:    Allergies  Allergen Reactions  . Citrus Hives  . Lisinopril Cough    Social History:   Social History   Socioeconomic History  . Marital status: Married    Spouse name: Not on file  . Number of children: Not on file  . Years of education: Not on file  . Highest education level: Not on file  Occupational History  . Not on file  Social Needs  . Financial resource strain: Not on file  . Food insecurity:    Worry: Not on file    Inability: Not on file  . Transportation needs:    Medical: Not on file    Non-medical: Not on file  Tobacco Use  . Smoking status: Current Every Day Smoker    Packs/day: 1.00    Years: 39.00    Pack years: 39.00    Types: Cigarettes  . Smokeless tobacco: Never Used  . Tobacco comment: down to 0.5ppd 06/22/17  Substance and Sexual Activity  . Alcohol use: No    Frequency: Never  . Drug use: No  . Sexual activity: Not on file  Lifestyle  . Physical activity:    Days per week: Not on file    Minutes per session: Not on file  . Stress: Not on file  Relationships  . Social connections:    Talks on phone: Not on file    Gets together: Not on file    Attends religious service: Not on file    Active member of club or organization: Not on file    Attends meetings of clubs or organizations: Not on file    Relationship status: Not on file  . Intimate partner violence:    Fear of current or ex partner: Not on file    Emotionally abused: Not on file    Physically abused: Not on file    Forced sexual activity: Not on file  Other Topics Concern  . Not on file  Social History Narrative  . Not on file      Family History:    Family History  Problem Relation Age of Onset  .  Diabetes Mother   . Hypertension Mother   . CAD Mother   . Diabetes Father   . Lung cancer Father   . Diabetes Sister       Review of Systems    General:  No chills, fever, night sweats or weight changes.  Cardiovascular:  No edema, orthopnea, palpitations, paroxysmal nocturnal dyspnea. Positive for chest pain and dyspnea on exertion.  Dermatological: No rash, lesions/masses Respiratory: No cough, Positive for dyspnea. Urologic: No hematuria, dysuria Abdominal:   No nausea, vomiting, diarrhea, bright red blood per rectum, melena, or hematemesis Neurologic:  No visual changes, wkns, changes in mental status. All other systems reviewed and are otherwise negative except as noted above.  Physical Exam/Data    Vitals:   05/27/18 2305 05/27/18 2308 05/27/18 2350 05/28/18 0500  BP: (!) 83/48 (!) 98/52 106/68 115/76  Pulse: 94  77 73  Resp: 20   18  Temp: 97.8 F (36.6 C)   98.5 F (36.9 C)  TempSrc:  Oral  Oral  SpO2: 100%   100%  Weight:  205 lb 0.4 oz (93 kg)  200 lb 9.6 oz (91 kg)  Height:  5\' 4"  (1.626 m)      Intake/Output Summary (Last 24 hours) at 05/28/2018 0911 Last data filed at 05/28/2018 0600 Gross per 24 hour  Intake 0 ml  Output 2150 ml  Net -2150 ml   Filed Weights   05/27/18 1602 05/27/18 2308 05/28/18 0500  Weight: 190 lb (86.2 kg) 205 lb 0.4 oz (93 kg) 200 lb 9.6 oz (91 kg)   Body mass index is 34.43 kg/m.   General: Pleasant, African American female appearing in NAD. Psych: Normal affect. Neuro: Alert and oriented X 3. Moves all extremities spontaneously. HEENT: Normal  Neck: Supple without bruits. JVD at 9cm. Lungs:  Resp regular and unlabored, rales along bases bilaterally. On 4L [place].  Heart: RRR no s3, s4, or murmurs. Tender to palpation along sternum.  Abdomen: Soft, non-tender, non-distended, BS + x 4.  Extremities: No clubbing, cyanosis or lower extremity edema.  DP/PT/Radials 2+ and equal bilaterally.   EKG:  The EKG was personally reviewed and demonstrates: NSR, HR 77, with diffuse T wave inversion along the inferior and anterior leads which is similar to prior tracings in 2018.   Labs/Studies     Relevant CV Studies:  Echocardiogram: 08/12/2017 Study Conclusions  - Left ventricle: The cavity size was normal. Systolic function was   normal. The estimated ejection fraction was in the range of 55%   to 60%. Wall motion was normal; there were no regional wall   motion abnormalities. Left ventricular diastolic function   parameters were normal. - Ventricular septum: The contour showed systolic flattening. These   changes are consistent with RV pressure overload. - Right ventricle: The cavity size was severely dilated. Wall   thickness was normal. Systolic function was mildly reduced. - Right atrium: The atrium was moderately dilated. - Atrial septum: No defect or patent foramen ovale was identified. - Pulmonary arteries: Systolic pressure was moderately increased.   PA peak pressure: 55 mm Hg (S).  Cardiac Catheterization: 01/2017  Mid RCA lesion, 20 %stenosed.  The left ventricular ejection fraction is 55-65% by visual estimate.  The left ventricular systolic function is normal.  LV end diastolic pressure is normal.   Moderate right sided heart pressure elevation with moderate pulmonary hypertension.  Normal systolic function with moderate left ventricular hypertrophy with EF estimate of approximately 60%.  Nonobstructive CAD with  smooth 20% narrowing in the mid RCA and otherwise normal LAD and left circumflex vessels.  RECOMMENDATION: Medical therapy   Laboratory Data:  Chemistry Recent Labs  Lab 05/27/18 1618  NA 139  K 4.3  CL 103  CO2 26  GLUCOSE 93  BUN 13  CREATININE 1.00  CALCIUM 10.0  GFRNONAA >60  GFRAA >60  ANIONGAP 10    No results for input(s): PROT, ALBUMIN, AST, ALT, ALKPHOS, BILITOT in the  last 168 hours. Hematology Recent Labs  Lab 05/27/18 1618  WBC 7.9  RBC 5.23*  HGB 10.7*  HCT 37.9  MCV 72.5*  MCH 20.5*  MCHC 28.2*  RDW 22.4*  PLT 124*   Cardiac Enzymes Recent Labs  Lab 05/27/18 2347 05/28/18 0629  TROPONINI 0.27* 0.36*    Recent Labs  Lab 05/27/18 1628  TROPIPOC 0.43*    BNP Recent Labs  Lab 05/27/18 2347  BNP 510.0*    DDimer No results for input(s): DDIMER in the last 168 hours.  Radiology/Studies:  Dg Chest 2 View  Result Date: 05/27/2018 CLINICAL DATA:  Patient from home, is c/o of sob with generalized pain. Chest pain, and dizziness present EXAM: CHEST - 2 VIEW COMPARISON:  08/11/2017 and older studies. FINDINGS: Cardiac silhouette is mildly enlarged. No mediastinal or hilar masses. No evidence of adenopathy. Mild linear opacity at the left lung base consistent with scarring, similar to the prior exam. No evidence of pneumonia or pulmonary edema. No pleural effusion or pneumothorax. Skeletal structures are intact. IMPRESSION: No active cardiopulmonary disease. Electronically Signed   By: Lajean Manes M.D.   On: 05/27/2018 16:32   Ct Angio Chest Pe W And/or Wo Contrast  Result Date: 05/27/2018 CLINICAL DATA:  57 y/o F; shortness of breath, chest pain, dizziness. EXAM: CT ANGIOGRAPHY CHEST WITH CONTRAST TECHNIQUE: Multidetector CT imaging of the chest was performed using the standard protocol during bolus administration of intravenous contrast. Multiplanar CT image reconstructions and MIPs were obtained to evaluate the vascular anatomy. CONTRAST:  100 cc Isovue 370 COMPARISON:  01/26/2018 CT angiogram chest. FINDINGS: Cardiovascular: Moderate cardiomegaly with prominent right atrial and ventricular enlargement. Normal caliber thoracic aorta. Borderline enlarged main pulmonary artery. Mild coronary artery calcific atherosclerosis. Satisfactory opacification of the pulmonary arteries. Mediastinum/Nodes: No enlarged mediastinal, hilar, or axillary lymph  nodes. Thyroid gland, trachea, and esophagus demonstrate no significant findings. Lungs/Pleura: Mild centrilobular emphysema greatest in the upper lobes. Smooth interlobular septal thickening compatible with interstitial edema. Minor atelectasis in the lung bases. No consolidation or pleural effusion. Upper Abdomen: No acute abnormality. Musculoskeletal: No chest wall abnormality. No acute or significant osseous findings. Review of the MIP images confirms the above findings. IMPRESSION: 1. No acute pulmonary embolus identified. 2. Moderate cardiomegaly with prominent right atrial and ventricular enlargement. 3. Interstitial pulmonary edema and mild centrilobular emphysema. Electronically Signed   By: Kristine Garbe M.D.   On: 05/27/2018 20:34     Assessment & Plan    1. Acute on Chronic Diastolic CHF  - She has a history of chronic hypoxic respiratory failure in the setting of COPD and pulmonary hypertension. Presented with worsening dyspnea at rest and with exertion over the past several days. Has baseline orthopnea but denies any worsening abdominal distention or lower extremity edema. Unsure of any weight changes but she does not weigh herself regularly. - BNP elevated to 510 on admission. CXR shows no active cardiopulmonary disease. CTA shows no evidence of an acute PE.  - She received a dose of IV Lasix  60 mg yesterday evening and diuresed -2.1 L. Will place order for IV Lasix at 40 mg twice daily. Obtain I&O's along with daily weights. Repeat BMET in AM. She was not on diuretic therapy prior to admission. Will likely require a standing dose at the time of discharge.  2. Chest Pain/ Elevated Troponin - The patient has a history of chest discomfort and elevated troponin values dating back to last year. Underwent a catheterization in 01/2017 which showed mild nonobstructive CAD with 20% stenosis along the RCA and no significant CAD along the LAD or LCx. Just underwent a Myoview stress test 2  weeks ago while hospitalized in [place] which showed no significant ischemia. - Troponin values have been flat this admission, peaking at 0.43. Were elevated to 0.9 during her admission 2 weeks ago but the reference range for that hospital is unavailable. EKG this admission shows diffuse TWI along the inferior and anterior leads which is similar to prior tracings in 2018.  CTA negative for PE. - On examination, her pain is reproducible along the sternum and was lasting for hours at a time when occurring yesterday.Overall this seems atypical for a cardiac etiology and most consistent with musculoskeletal pain. - Echocardiogram is pending to assess LV function and to rule out a pericardial effusion. If no significant abnormalities by echo, would not pursue further ischemic evaluation at this time given her recent cath and stress test.  3. Pulmonary HTN/ Chronic Hypoxic Respiratory Failure - PA peak pressure elevated to 55 mm Hg by echo in 07/2017. Reports compliance with CPAP at night and 4L nasal cannula throughout the day in the setting of her chronic hypoxic respiratory failure.  4. HTN - BP has been variable at 83/48 - 147/99 since admission. At 115/76 on most recent check.  - continue low-dose Amlodipine.   5. Tobacco Use - she recently quit smoking 2 weeks ago. Congratulated on this with continued cessation advised.    For questions or updates, please contact [place] Please consult [email or web address] for contact info under Cardiology/STEMI.  Signed, Erma Heritage, PA-C 05/28/2018, 9:11 AM Pager: [phone]  Cardiology Attending  Patient seen and examined. Agree with the findings as noted above. The patient presents with acute on chronic diastolic heart failure and chest pain, atypical for ischemia. She has no obstructive disease by cath a year ago and her abnormal ECG is unchanged from a year ago. She has diuresed. She is not on home lasix. Her exam is accurately noted above. I  would recommend continued IV lasix. No indication for additional workup of CAD. We will follow with you.  Mikle Bosworth.D.

## 2018-05-29 DIAGNOSIS — M797 Fibromyalgia: Secondary | ICD-10-CM

## 2018-05-29 DIAGNOSIS — R079 Chest pain, unspecified: Secondary | ICD-10-CM

## 2018-05-29 DIAGNOSIS — J449 Chronic obstructive pulmonary disease, unspecified: Secondary | ICD-10-CM

## 2018-05-29 LAB — BASIC METABOLIC PANEL
Anion gap: 9 (ref 5–15)
BUN: 15 mg/dL (ref 6–20)
CHLORIDE: 101 mmol/L (ref 98–111)
CO2: 33 mmol/L — ABNORMAL HIGH (ref 22–32)
CREATININE: 0.95 mg/dL (ref 0.44–1.00)
Calcium: 9.7 mg/dL (ref 8.9–10.3)
GFR calc Af Amer: >60 mL/min (ref >60)
Glucose, Bld: 93 mg/dL (ref 70–99)
Potassium: 3.7 mmol/L (ref 3.5–5.1)
SODIUM: 143 mmol/L (ref 135–145)

## 2018-05-29 MED ORDER — IPRATROPIUM-ALBUTEROL 0.5-2.5 (3) MG/3ML IN SOLN: 3.0000 mL | Freq: Four times a day (QID) | RESPIRATORY_TRACT | 1 refills | Status: DC | PRN

## 2018-05-29 MED ORDER — FUROSEMIDE 40 MG PO TABS: 40.0000 mg | ORAL_TABLET | Freq: Every day | ORAL | 11 refills | Status: DC

## 2018-05-29 MED ORDER — POTASSIUM CHLORIDE ER 20 MEQ PO TBCR: 20.0000 meq | EXTENDED_RELEASE_TABLET | Freq: Every day | ORAL | 1 refills | Status: DC

## 2018-05-29 NOTE — Progress Notes (Signed)
Patient has been provided discharge instructions, new medication information, follow up appointment information and when to notify the MD. Patient verbalizes understanding of all discharge instructions.

## 2018-05-29 NOTE — Discharge Summary (Addendum)
Physician Discharge Summary  Teresa Roberts VHQ:[id number] DOB: 1961/02/11 DOA: 05/27/2018  PCP: Jonathon Jordan, MD  Admit date: 05/27/2018 Discharge date: 05/29/2018  Time spent: 40* minutes  Recommendations for Outpatient Follow-up:  1. Follow up cardiology in 2-3 weeks 2. Follow up PCP in 2 weeks  Discharge Diagnoses:  Principal Problem:   Chest pain Active Problems:   MGUS (monoclonal gammopathy of unknown significance)   Fibromyalgia   COPD (chronic obstructive pulmonary disease) (HCC)   Tobacco abuse   History of migraine   Acute on chronic diastolic CHF (congestive heart failure) (HCC)   OSA (obstructive sleep apnea)   Pulmonary hypertension (HCC)   HLD (hyperlipidemia)   Essential hypertension   Depression   Discharge Condition: Stable  Diet recommendation: Heart healthy diet  Filed Weights   05/27/18 2308 05/28/18 0500 05/29/18 0518  Weight: 93 kg (205 lb 0.4 oz) 91 kg (200 lb 9.6 oz) 89 kg (196 lb 1.6 oz)    History of present illness:   57 y.o.femalewith medical history significant offibromyalgia, hypertension, hyperlipidemia, COPD on 4 L oxygen at home, depression,dCHF, pulmonary hypertension, migraine headache, obesity, OSA on CPAP, former smoker (quit smoking 2 weeks ago), who presents with chest painandshortness of breath.    Hospital Course:    1. Chest pain-resolved, with patient had elevated troponin up to 0.32, cardiology was consulted and does not feel that patient has ischemic cardiomyopathy.  She had cardiac cath a year ago which showed nonobstructive disease.  No further work-up as per cardiology. 2. Acute on chronic diastolic CHF-patient started on furosemide 40 mg IV every 12.  Good diuresis.  Net -5670 mL.    Cardiology has signed off, patient will be discharged on Lasix 40 mg p.o. daily.  She is currently on oxygen via nasal cannula at 4 L/min at home. 3. COPD- patient will follow-up with pulmonology as outpatient.  4. Hypertension-blood  pressure stable, continue Norvasc.  Will discontinue HCTZ, patient has been started on Lasix 40 mg p.o. daily as above. 5. Fibromyalgia-stable, continue Neurontin, Skelaxin, Topamax, trazodone 6. MGUS-follows oncology as outpatient.     Procedures:  Echocardiogram  Consultations:  Cardiology  Discharge Exam: Vitals:   05/29/18 0518 05/29/18 0843  BP: 105/66   Pulse: 73   Resp: 18   Temp: 98.3 F (36.8 C)   SpO2: 96% 96%    General: Appears in no acute distress Cardiovascular: S1-S2, regular Respiratory: Clear to auscultation bilaterally  Discharge Instructions   Discharge Instructions    Diet - low sodium heart healthy   Complete by:  As directed    Increase activity slowly   Complete by:  As directed      Allergies as of 05/29/2018      Reactions   Citrus Hives   Lisinopril Cough      Medication List    STOP taking these medications   hydrochlorothiazide 25 MG tablet Commonly known as:  HYDRODIURIL   JUST TEARS EYE DROPS Soln     TAKE these medications   amLODipine 2.5 MG tablet Commonly known as:  NORVASC Take 2.5 mg by mouth daily.   aspirin 81 MG EC tablet Take 81 mg by mouth daily after lunch. What changed:  Another medication with the same name was removed. Continue taking this medication, and follow the directions you see here.   busPIRone 15 MG tablet Commonly known as:  BUSPAR Take 15 mg by mouth 2 (two) times daily.   butalbital-acetaminophen-caffeine 50-325-40 MG tablet Commonly known as:  FIORICET, ESGIC Take 1 tablet by mouth 2 (two) times daily as needed for headache.   docusate sodium 100 MG capsule Commonly known as:  COLACE Take 100 mg by mouth 3 (three) times daily.   DULoxetine 30 MG capsule Commonly known as:  CYMBALTA take 60mg  capsule once daily at bedtime What changed:    how much to take  how to take this  when to take this  additional instructions   Fluticasone-Umeclidin-Vilant 100-62.5-25 MCG/INH  Aepb Commonly known as:  TRELEGY ELLIPTA Inhale 1 puff into the lungs daily. What changed:  Another medication with the same name was removed. Continue taking this medication, and follow the directions you see here.   folic acid 1 MG tablet Commonly known as:  FOLVITE Take 1 mg by mouth daily after lunch.   furosemide 40 MG tablet Commonly known as:  LASIX Take 1 tablet (40 mg total) by mouth daily.   gabapentin 800 MG tablet Commonly known as:  NEURONTIN Take 800 mg by mouth 2 (two) times daily.   HAIR/SKIN/NAILS/BIOTIN Tabs Take 1 tablet by mouth daily after lunch.   ipratropium-albuterol 0.5-2.5 (3) MG/3ML Soln Commonly known as:  DUONEB Take 3 mLs by nebulization every 6 (six) hours as needed (Shortness of breath).   latanoprost 0.005 % ophthalmic solution Commonly known as:  XALATAN Place 1 drop into both eyes at bedtime.   metaxalone 800 MG tablet Commonly known as:  SKELAXIN TAKE 1 TABLET (800 MG TOTAL) BY MOUTH 3 (THREE) TIMES DAILY AS NEEDED FOR MUSCLE SPASMS.   modafinil 100 MG tablet Commonly known as:  PROVIGIL Take 100 mg by mouth daily.   OXYGEN Inhale 4 L into the lungs continuous.   Potassium Chloride ER 20 MEQ Tbcr Take 20 mEq by mouth daily.   PROBIOTIC PO Take 1 tablet by mouth daily.   rizatriptan 10 MG disintegrating tablet Commonly known as:  MAXALT-MLT Take 10 mg by mouth as needed for migraine. May repeat in 2 hours if needed   simvastatin 10 MG tablet Commonly known as:  ZOCOR Take 10 mg by mouth at bedtime.   topiramate 25 MG tablet Commonly known as:  TOPAMAX Take 50 mg by mouth daily.   traZODone 100 MG tablet Commonly known as:  DESYREL Take 100 mg by mouth at bedtime.   vitamin B-12 1000 MCG tablet Commonly known as:  CYANOCOBALAMIN Take 1,000 mcg by mouth daily after lunch.            Durable Medical Equipment  (From admission, onward)        Start     Ordered   05/29/18 1106  For home use only DME Nebulizer  machine  Once    Question:  Patient needs a nebulizer to treat with the following condition  Answer:  COPD (chronic obstructive pulmonary disease) ([place])   05/29/18 1106     Allergies  Allergen Reactions  . Citrus Hives  . Lisinopril Cough   Follow-up Information    Jonathon Jordan, MD Follow up in 2 week(s).   Specialty:  Family Medicine Contact information: [place] [address] [phone]        Josue Hector, MD .   Specialty:  Cardiology Contact information: [phone] [address] [place] [address] [phone]            The results of significant diagnostics from this hospitalization (including imaging, microbiology, ancillary and laboratory) are listed below for reference.  Significant Diagnostic Studies: Dg Chest 2 View  Result Date: 05/27/2018 CLINICAL DATA:  Patient from home, is c/o of sob with generalized pain. Chest pain, and dizziness present EXAM: CHEST - 2 VIEW COMPARISON:  08/11/2017 and older studies. FINDINGS: Cardiac silhouette is mildly enlarged. No mediastinal or hilar masses. No evidence of adenopathy. Mild linear opacity at the left lung base consistent with scarring, similar to the prior exam. No evidence of pneumonia or pulmonary edema. No pleural effusion or pneumothorax. Skeletal structures are intact. IMPRESSION: No active cardiopulmonary disease. Electronically Signed   By: Lajean Manes M.D.   On: 05/27/2018 16:32   Ct Angio Chest Pe W And/or Wo Contrast  Result Date: 05/27/2018 CLINICAL DATA:  57 y/o F; shortness of breath, chest pain, dizziness. EXAM: CT ANGIOGRAPHY CHEST WITH CONTRAST TECHNIQUE: Multidetector CT imaging of the chest was performed using the standard protocol during bolus administration of intravenous contrast. Multiplanar CT image reconstructions and MIPs were obtained to evaluate the vascular anatomy. CONTRAST:  100 cc Isovue 370 COMPARISON:  01/26/2018 CT angiogram chest.  FINDINGS: Cardiovascular: Moderate cardiomegaly with prominent right atrial and ventricular enlargement. Normal caliber thoracic aorta. Borderline enlarged main pulmonary artery. Mild coronary artery calcific atherosclerosis. Satisfactory opacification of the pulmonary arteries. Mediastinum/Nodes: No enlarged mediastinal, hilar, or axillary lymph nodes. Thyroid gland, trachea, and esophagus demonstrate no significant findings. Lungs/Pleura: Mild centrilobular emphysema greatest in the upper lobes. Smooth interlobular septal thickening compatible with interstitial edema. Minor atelectasis in the lung bases. No consolidation or pleural effusion. Upper Abdomen: No acute abnormality. Musculoskeletal: No chest wall abnormality. No acute or significant osseous findings. Review of the MIP images confirms the above findings. IMPRESSION: 1. No acute pulmonary embolus identified. 2. Moderate cardiomegaly with prominent right atrial and ventricular enlargement. 3. Interstitial pulmonary edema and mild centrilobular emphysema. Electronically Signed   By: Kristine Garbe M.D.   On: 05/27/2018 20:34    Microbiology: No results found for this or any previous visit (from the past 240 hour(s)).   Labs: Basic Metabolic Panel: Recent Labs  Lab 05/27/18 1618 05/29/18 0639  NA 139 143  K 4.3 3.7  CL 103 101  CO2 26 33*  GLUCOSE 93 93  BUN 13 15  CREATININE 1.00 0.95  CALCIUM 10.0 9.7   Liver Function Tests: No results for input(s): AST, ALT, ALKPHOS, BILITOT, PROT, ALBUMIN in the last 168 hours. No results for input(s): LIPASE, AMYLASE in the last 168 hours. No results for input(s): AMMONIA in the last 168 hours. CBC: Recent Labs  Lab 05/27/18 1618  WBC 7.9  HGB 10.7*  HCT 37.9  MCV 72.5*  PLT 124*   Cardiac Enzymes: Recent Labs  Lab 05/27/18 2347 05/28/18 0629 05/28/18 1201  TROPONINI 0.27* 0.36* 0.32*   BNP: BNP (last 3 results) Recent Labs    08/09/17 0956 05/27/18 2347  BNP  2,139.4* 510.0*      Signed:  Oswald Hillock MD.  [facility] 05/29/2018, 11:10 AM

## 2018-05-29 NOTE — Progress Notes (Signed)
Pt had nebulizer delivered by [facility], advised she was ready to call spouse for pickup, verbalized understanding

## 2018-05-29 NOTE — Plan of Care (Signed)
  Problem: Respiratory: Goal: Ability to maintain a clear airway will improve Outcome: Completed/Met Goal: Levels of oxygenation will improve Outcome: Completed/Met Goal: Ability to maintain adequate ventilation will improve Outcome: Completed/Met   Problem: Clinical Measurements: Goal: Will remain free from infection Outcome: Completed/Met   Problem: Nutrition: Goal: Adequate nutrition will be maintained Outcome: Completed/Met   Problem: Elimination: Goal: Will not experience complications related to bowel motility Outcome: Completed/Met Goal: Will not experience complications related to urinary retention Outcome: Completed/Met   Problem: Safety: Goal: Ability to remain free from injury will improve Outcome: Completed/Met   Problem: Skin Integrity: Goal: Risk for impaired skin integrity will decrease Outcome: Completed/Met

## 2018-05-29 NOTE — Progress Notes (Addendum)
Subjective:  Denies SSCP, palpitations or Dyspnea   Objective:  Vitals:   05/28/18 1958 05/29/18 0003 05/29/18 0518 05/29/18 0843  BP: 119/73 104/65 105/66   Pulse: 85 89 73   Resp: 19 18 18    Temp: 97.8 F (36.6 C) 98.1 F (36.7 C) 98.3 F (36.8 C)   TempSrc: Oral Oral Oral   SpO2: 94% 95% 96% 96%  Weight:   196 lb 1.6 oz (89 kg)   Height:        Intake/Output from previous day:  Intake/Output Summary (Last 24 hours) at 05/29/2018 0852 Last data filed at 05/29/2018 0841 Gross per 24 hour  Intake 840 ml  Output 4600 ml  Net -3760 ml    Physical Exam: Affect appropriate Overweight black female  HEENT: normal Neck supple with no adenopathy JVP normal no bruits no thyromegaly Lungs clear with no wheezing and good diaphragmatic motion Heart:  S1/S2 no murmur, no rub, gallop or click PMI normal Abdomen: benighn, BS positve, no tenderness, no AAA no bruit.  No HSM or HJR Distal pulses intact with no bruits No edema Neuro non-focal Skin warm and dry No muscular weakness   Lab Results: Basic Metabolic Panel: Recent Labs    05/27/18 1618 05/29/18 0639  NA 139 143  K 4.3 3.7  CL 103 101  CO2 26 33*  GLUCOSE 93 93  BUN 13 15  CREATININE 1.00 0.95  CALCIUM 10.0 9.7   Liver Function Tests: No results for input(s): AST, ALT, ALKPHOS, BILITOT, PROT, ALBUMIN in the last 72 hours. No results for input(s): LIPASE, AMYLASE in the last 72 hours. CBC: Recent Labs    05/27/18 1618  WBC 7.9  HGB 10.7*  HCT 37.9  MCV 72.5*  PLT 124*   Cardiac Enzymes: Recent Labs    05/27/18 2347 05/28/18 0629 05/28/18 1201  TROPONINI 0.27* 0.36* 0.32*   BNP: Invalid input(s): POCBNP D-Dimer: No results for input(s): DDIMER in the last 72 hours. Hemoglobin A1C: Recent Labs    05/28/18 0008  HGBA1C 6.3*   Fasting Lipid Panel: Recent Labs    05/28/18 0629  CHOL 144  HDL 31*  LDLCALC 84  TRIG 147  CHOLHDL 4.6   Thyroid Function Tests: No results for  input(s): TSH, T4TOTAL, T3FREE, THYROIDAB in the last 72 hours.  Invalid input(s): FREET3 Anemia Panel: No results for input(s): VITAMINB12, FOLATE, FERRITIN, TIBC, IRON, RETICCTPCT in the last 72 hours.  Imaging: CT no PE  Cardiac Studies:  ECG: SR chronic inferolateral T wave changes    Telemetry:  NSR 05/29/2018   Echo: EF 50-55% no defined RWMA;s reviewed images right sided failure   Medications:   . amLODipine  2.5 mg Oral Daily  . aspirin EC  325 mg Oral QPC lunch  . atorvastatin  80 mg Oral q1800  . busPIRone  15 mg Oral BID  . docusate sodium  100 mg Oral TID  . DULoxetine  30 mg Oral Daily  . enoxaparin (LOVENOX) injection  40 mg Subcutaneous Q24H  . fluticasone furoate-vilanterol  1 puff Inhalation Daily   And  . umeclidinium bromide  1 puff Inhalation Daily  . folic acid  1 mg Oral QPC lunch  . furosemide  40 mg Intravenous BID  . gabapentin  800 mg Oral BID  . ipratropium-albuterol  3 mL Nebulization TID  . latanoprost  1 drop Both Eyes QHS  . modafinil  100 mg Oral Daily  . saccharomyces boulardii  250 mg Oral  Daily  . topiramate  50 mg Oral Daily  . traZODone  100 mg Oral QHS  . vitamin B-12  1,000 mcg Oral QPC lunch      Assessment/Plan:   Chest Pain:  Chronic low level elevation .32 no evolution Normal heart cath March 2018 reviewed these films and  Coronary arteries are fairly pristine. CT no PE TTE no defined RWMA low normal EF No indication for further Cardiac w/u  COPD.  Needs to stop smoking continue oxygen and CPAP suspect this is etiology of her pains along with  Cor pulmonale will need outpatient pulmonary f/u   HLD:  On statin   HTN:  stable continue norvasc change to PO diuretic   Time spent with direct patient contact and reviewing TTE and cath films form March 2018 30 minutes   Will sign off   Jenkins Rouge 05/29/2018, 8:52 AM

## 2018-05-30 ENCOUNTER — Ambulatory Visit: Payer: Self-pay | Admitting: Rheumatology

## 2018-06-01 DIAGNOSIS — D472 Monoclonal gammopathy: Secondary | ICD-10-CM | POA: Diagnosis not present

## 2018-06-01 DIAGNOSIS — J441 Chronic obstructive pulmonary disease with (acute) exacerbation: Secondary | ICD-10-CM | POA: Diagnosis not present

## 2018-06-01 DIAGNOSIS — I5043 Acute on chronic combined systolic (congestive) and diastolic (congestive) heart failure: Secondary | ICD-10-CM | POA: Diagnosis not present

## 2018-06-05 ENCOUNTER — Ambulatory Visit (INDEPENDENT_AMBULATORY_CARE_PROVIDER_SITE_OTHER): Payer: Self-pay | Admitting: Physician Assistant

## 2018-06-05 ENCOUNTER — Encounter: Payer: Self-pay | Admitting: Physician Assistant

## 2018-06-05 VITALS — BP 112/74 | HR 86 | Resp 18 | Ht 64.0 in | Wt 200.0 lb

## 2018-06-05 DIAGNOSIS — M62838 Other muscle spasm: Secondary | ICD-10-CM | POA: Diagnosis not present

## 2018-06-05 DIAGNOSIS — R5383 Other fatigue: Secondary | ICD-10-CM | POA: Diagnosis not present

## 2018-06-05 DIAGNOSIS — M797 Fibromyalgia: Secondary | ICD-10-CM

## 2018-06-05 DIAGNOSIS — D472 Monoclonal gammopathy: Secondary | ICD-10-CM

## 2018-06-05 DIAGNOSIS — Z720 Tobacco use: Secondary | ICD-10-CM

## 2018-06-05 DIAGNOSIS — Z8709 Personal history of other diseases of the respiratory system: Secondary | ICD-10-CM

## 2018-06-05 DIAGNOSIS — M542 Cervicalgia: Secondary | ICD-10-CM | POA: Diagnosis not present

## 2018-06-05 DIAGNOSIS — M7062 Trochanteric bursitis, left hip: Secondary | ICD-10-CM | POA: Diagnosis not present

## 2018-06-05 DIAGNOSIS — F5101 Primary insomnia: Secondary | ICD-10-CM

## 2018-06-05 DIAGNOSIS — Z8669 Personal history of other diseases of the nervous system and sense organs: Secondary | ICD-10-CM

## 2018-06-05 MED ORDER — LIDOCAINE HCL 1 % IJ SOLN
0.5000 mL | INTRAMUSCULAR | Status: AC | PRN
Start: 2018-06-05 — End: 2018-06-05
  Administered 2018-06-05: .5 mL

## 2018-06-05 MED ORDER — TRIAMCINOLONE ACETONIDE 40 MG/ML IJ SUSP
10.0000 mg | INTRAMUSCULAR | Status: AC | PRN
  Administered 2018-06-05: 10 mg via INTRAMUSCULAR

## 2018-06-05 MED ORDER — TIZANIDINE HCL 4 MG PO TABS: 4.0000 mg | ORAL_TABLET | Freq: Every evening | ORAL | 0 refills | Status: DC | PRN

## 2018-06-19 ENCOUNTER — Telehealth: Payer: Self-pay | Admitting: Pulmonary Disease

## 2018-06-19 ENCOUNTER — Ambulatory Visit (INDEPENDENT_AMBULATORY_CARE_PROVIDER_SITE_OTHER): Payer: 59 | Admitting: Pulmonary Disease

## 2018-06-19 ENCOUNTER — Encounter: Payer: Self-pay | Admitting: Pulmonary Disease

## 2018-06-19 DIAGNOSIS — J9611 Chronic respiratory failure with hypoxia: Secondary | ICD-10-CM

## 2018-06-19 DIAGNOSIS — Z720 Tobacco use: Secondary | ICD-10-CM | POA: Diagnosis not present

## 2018-06-19 DIAGNOSIS — J449 Chronic obstructive pulmonary disease, unspecified: Secondary | ICD-10-CM | POA: Diagnosis not present

## 2018-06-19 MED ORDER — FLUTICASONE-UMECLIDIN-VILANT 100-62.5-25 MCG/INH IN AEPB: 1.0000 | INHALATION_SPRAY | Freq: Every day | RESPIRATORY_TRACT | 3 refills | Status: DC

## 2018-06-19 MED ORDER — ALBUTEROL SULFATE (2.5 MG/3ML) 0.083% IN NEBU: 2.5000 mg | INHALATION_SOLUTION | Freq: Four times a day (QID) | RESPIRATORY_TRACT | 12 refills | Status: DC | PRN

## 2018-06-19 NOTE — Patient Instructions (Signed)
Refills on [facility]. Prescription for albuterol nebs thrice daily as needed #60  Stay on Lasix once daily CPAP is working well

## 2018-06-19 NOTE — Telephone Encounter (Signed)
Resent pt's Rx with the diagnosis code on it as pt had stated to be done. Attempted to call pt to let her know this had been done but unable to reach pt.  Left a message for pt to return call.

## 2018-06-19 NOTE — Assessment & Plan Note (Signed)
-   Currently on 4L home oxygen and Trelegy - Discharged from recent admission w/ DuoNeb nebulizer - Pt has the machine but yet to receive solution  Plan - C/w Trelegy, refilled today as requested - Start Albuterol nebulizer solution as needed for rescue instead of DuoNeb

## 2018-06-19 NOTE — Addendum Note (Signed)
Addended by: Valerie Salts on: 06/19/2018 04:48 PM   Modules accepted: Orders

## 2018-06-19 NOTE — Progress Notes (Addendum)
Teresa Roberts    [id number]    30-Nov-1960  Primary Care Physician:Wolters, Ivin Booty, MD  Referring Physician: Jonathon Jordan, MD [place], [place] [address]  Chief complaint:  COPD  HPI: Teresa Roberts is a 57 yo F w/ PMH of HTN, HLD, Diastolic CHF, CAD presenting to the clinic for continuing management of her COPD. Since her last visit, she has had 2 admissions for shortness of breath, with the most recent admission in 05/27/2018. She was worked up for ACS, found to have some pulmonary edema and discharged once symptoms improved with Lasix. She states since discharge she has had no additional dyspneic episodes. She is continuing to Lasix 40mg  po daily as prescribed.   For her COPD, she is currently on 4L home oxygen and Trelegy for maintenance and she was told to pick up duoneb nebulizer. She picked up the nebulizer machine but she has yet to pick up the nebulizer solution. She has quit smoking. She has no Albuterol inhaler at home. Denies any F/N/V/D/C. No cough, wheezing, chest pain, palpitations.  Smoking history: ~20 pack year smoking history with 1/2pack a day for 40 years. Currently have been tobacco free for last 2 months.  Allergies as of 06/19/2018 - Review Complete 06/19/2018  Allergen Reaction Noted  . Citrus Hives 09/12/2014  . Lisinopril Cough 09/12/2014    Past Medical History:  Diagnosis Date  . CAD (coronary artery disease)    a. 01/2017: cath showing 20% mid RCA stenosis and otherwise normal LAD and LCx with a preserved EF of 55 to 60%; mod pulmonary HTN  . COPD (chronic obstructive pulmonary disease) ([facility])    O2 dependent  . cpap   . Depression    PTSD  . Diastolic CHF ([facility])   . Fibromyalgia   . Hyperlipidemia   . Hypertension   . Migraines     Past Surgical History:  Procedure Laterality Date  . APPENDECTOMY    . CESAREAN SECTION    . ORIF ANKLE FRACTURE Left 08/12/2017   Procedure: OPEN REDUCTION INTERNAL FIXATION (ORIF)  ANKLE FRACTURE;  Surgeon: Rod Can, MD;  Location: [facility];  Service: Orthopedics;  Laterality: Left;  . RIGHT/LEFT HEART CATH AND CORONARY ANGIOGRAPHY N/A 01/27/2017   Procedure: Right/Left Heart Cath and Coronary Angiography;  Surgeon: Troy Sine, MD;  Location: [facility];  Service: Cardiovascular;  Laterality: N/A;     Review of systems: Review of Systems  Constitutional: Negative for fever and chills.  HENT: Negative.   Eyes: Negative for blurred vision.  Respiratory: as per HPI  Cardiovascular: Negative for chest pain and palpitations.  Gastrointestinal: Negative for vomiting, diarrhea, blood per rectum. Genitourinary: Negative for dysuria, urgency, frequency and hematuria.  Musculoskeletal: Negative for myalgias, back pain and joint pain.  Skin: Negative for itching and rash.  Neurological: Negative for dizziness, tremors, focal weakness, seizures and loss of consciousness.  Endo/Heme/Allergies: Negative for environmental allergies.  Psychiatric/Behavioral: Negative for depression, suicidal ideas and hallucinations.  All other systems reviewed and are negative.  Physical Exam: Blood pressure 124/84, pulse 72, height 5\' 4"  (1.626 m), weight 203 lb (92.1 kg), last menstrual period 08/19/2012, SpO2 96 %. Gen:      AAF in no acute distress, has 4L nasal cannula on HEENT:  EOMI, sclera anicteric Neck:     No masses; no thyromegaly Lungs:    Distant breath sounds, Normal respiratory efforts, No dullness of percussion, no rales, no ronchi  CV:         Regular rate and rhythm; no murmurs Ext:    No edema; adequate peripheral perfusion Skin:      Warm and dry; no rash Neuro: alert and oriented x 3 Psych: normal mood and affect  Imaging: Echo from 05/28/2018 show mild LVH, norm systolic function, EF 89~37%, Indeterminate diastolic function, PA peak pressure of 61mmHg This is an improvement from a prior study from 08/12/2017 which show PA peak pressure of  30mmHg  Assessment:  Refer to Encounters tab for problem based charting  Plan/Recommendations: Refer to Encounters tab for problem based charting  Gilberto Better, PGY1 [facility] 06/19/2018, 11:52 AM  CC: Jonathon Jordan, MD   Independently examined pt, evaluated data & formulated above care plan with NP/resident   Since her last visit, she had 2 hospitalizations including one at [facility] 05/2018 for shortness of breath, she was diuresed 5 L with improvement.  Echo surprisingly showed RVSP of only 25 mm which is improved from her prior PA pressure of 55 mm estimated. She has been able to obtain a portable concentrator and maintains this is 4 L pulse.  Breathing is not improved, she remains on Lasix and is compliant.  On exam-no JVD or edema, decreased breath sounds bilateral, no rhonchi or crackles  She is finally been able to quit smoking and I congratulated her on this. CPAP download was reviewed which shows excellent compliance and good control of events, her baseline and PSG was done at the [facility] which per report showed AHI of 16/hour CPAP download also shows average pressure of 16 cm, she is maintained on auto settings 12 to 20 cm  Her hypoxia seems to be somewhat out of proportion to degree of airway obstruction.  Pulmonary hypertension does not seem to be significant there is no evidence of PFO.  Leanna Sato Elsworth Soho MD

## 2018-06-19 NOTE — Assessment & Plan Note (Signed)
-   Has 40 pack year smoking hx - Smoked half a pack a day for 20 years - Currently smoke free for 2 months - Discussed alternative aid for smoking cessation for craving control

## 2018-06-19 NOTE — Assessment & Plan Note (Signed)
Shortness of breath episode resulting in admission 2/2 COPD exacerbation vs CHF exacerbation (cor pulmonale vs diastolic heart failure)  - Patient's recent dyspneic episodes most likely 2/2 fluid overload as her symptoms significantly improved with Lasix - Most recent Echo show 50~55% EF, PA peak pressure of 44mmHg - Currently stable with satting 96% in office  Plan: - C/w current Lasix dose - C/w home oxygen 4L - F/u with Cardiology

## 2018-06-22 ENCOUNTER — Telehealth (HOSPITAL_COMMUNITY): Payer: Self-pay

## 2018-06-22 NOTE — Telephone Encounter (Signed)
Patients insurance is active and benefits verified through [facility] - No co-pay, deductible, out of pocket amount of $500.00/$500.00 has been met, no co-insurance, and no pre-authorization is required. Reference [id number]  Patient is insured through Medicare A/B - Patient will need to contact insurance company to see if she has met her deductible.

## 2018-06-22 NOTE — Telephone Encounter (Signed)
Attempted to contact patient in regards to Pulmonary Rehab - lm on vm °

## 2018-06-24 ENCOUNTER — Other Ambulatory Visit: Payer: Self-pay | Admitting: Rheumatology

## 2018-06-28 ENCOUNTER — Telehealth (HOSPITAL_COMMUNITY): Payer: Self-pay

## 2018-06-28 NOTE — Telephone Encounter (Signed)
Patient called to schedule for the Pulmonary Rehab Program. Patient will come in for orientation on 07/14/18 @ 1:30PM and will attend the 1:30PM exercise class.  Mailed homework package.  Went over insurance, patient verbalized understanding.

## 2018-06-29 ENCOUNTER — Ambulatory Visit: Payer: 59 | Admitting: Cardiology

## 2018-07-04 ENCOUNTER — Other Ambulatory Visit: Payer: Self-pay | Admitting: Physician Assistant

## 2018-07-04 NOTE — Telephone Encounter (Signed)
Last Visit: 06/24/18 Next Visit: 12/06/18  Okay to refill per Dr. Estanislado Pandy

## 2018-07-07 ENCOUNTER — Other Ambulatory Visit: Payer: Self-pay | Admitting: Physician Assistant

## 2018-07-07 ENCOUNTER — Other Ambulatory Visit: Payer: Self-pay | Admitting: Rheumatology

## 2018-07-07 NOTE — Telephone Encounter (Signed)
Last visit:06/05/18 Next visit:12/06/18  Okay to refill per Dr. Deveshwar 

## 2018-07-10 NOTE — H&P (View-Only) (Signed)
Cardiology Office Note:    Date:  07/11/2018   ID:  Teresa Roberts, DOB Mar 25, 1961, MRN [id number]  PCP:  Jonathon Jordan, MD  Cardiologist:  Buford Dresser, MD PhD  Referring MD: Jonathon Jordan, MD   Chief Complaint  Patient presents with  . New Patient (Initial Visit)  Shortness of breath  History of Present Illness:    Teresa Roberts is a 57 y.o. female with a hx of fibromyalgia, hypertension, COPD, chronic diastolic heart failure, nononbstructive CAD who is seen as a new consult at the request of Dr. Stephanie Acre for evaluation and management of hypertension and chronic diastolic heart failure  Patient concerns:  1) blood pressure controlled at today's visit, feels like it is under control.  2) Fluid--started water pills in July, has been going to the bathroom a lot but no change to her breathing. Never gets swelling in her feet, sometimes in her abdomen. Sleeps on one pillow stably, sleeps with CPAP and sometimes she wakes up because of that. Wakes up 3-4 times/night to use the restroom.   She has had multiple admissions, most recently on 05/27/18, for shortness of breath. In 01/2017 she was admitted and discharged on 3L home O2. CTA negative for PE that admission. In 07/2017 she had hypercarbic respiratory failure with sepsis.  COPD notable for spirometry 02/15/2017: FEV1 52% pred, FVC 60% pred, FEV1/FVC 85% predicted. She was still smoking at the time of these PFTs.  Had an episode when she blacked out on a plane. Not sure what happened, was sitting in her seat and lost consciousness. Plan was getting ready to land at the time, she woke up in an ambulance. Was told it was CHF and pulmonary edema. Was in the hospital for two days in [place]; did ok on the flight home. Had another episode in September of last year when she was septic; lost consciousness.   Today feels "bad." Has a headache, feels sluggish. No fevers, does sweat a lot at night. No chills. No significant cough. Did quit smoking  several months ago, has not improved her breathing. Is followed by Dr. Elsworth Soho in pulmonary.  On my review of her most recent echo, she has a trivial pericardial effusion. Her RV is enlarged, actually appearing larger than her LV on her apical views. Her septum is flattened and has septal bounce. Her apical RV is trabeculated and hypocontractile, though her TAPSE at the base is normal. Her TR is trivial to mild. I do not think the RVSP is accurate, as the TR jet is not complete. So while her RA-RV gradient was thought to be only 22 mmHg, I think this is likely underrepresented. Her PASP was estimated at 25 mmHg, and I think this is low.  Denies recent chest pain, other than sharp discomfort when changing position.   Past Medical History:  Diagnosis Date  . CAD (coronary artery disease)    a. 01/2017: cath showing 20% mid RCA stenosis and otherwise normal LAD and LCx with a preserved EF of 55 to 60%; mod pulmonary HTN  . COPD (chronic obstructive pulmonary disease) ([place])    O2 dependent  . cpap   . Depression    PTSD  . Diastolic CHF ([place])   . Fibromyalgia   . Hyperlipidemia   . Hypertension   . Migraines     Past Surgical History:  Procedure Laterality Date  . APPENDECTOMY    . CESAREAN SECTION    . ORIF ANKLE FRACTURE Left 08/12/2017   Procedure: OPEN REDUCTION  INTERNAL FIXATION (ORIF) ANKLE FRACTURE;  Surgeon: Rod Can, MD;  Location: [facility];  Service: Orthopedics;  Laterality: Left;  . RIGHT/LEFT HEART CATH AND CORONARY ANGIOGRAPHY N/A 01/27/2017   Procedure: Right/Left Heart Cath and Coronary Angiography;  Surgeon: Troy Sine, MD;  Location: [facility];  Service: Cardiovascular;  Laterality: N/A;    Current Medications: Current Outpatient Medications on File Prior to Visit  Medication Sig  . albuterol (PROVENTIL) (2.5 MG/3ML) 0.083% nebulizer solution Take 3 mLs (2.5 mg total) by nebulization every 6 (six) hours as needed for wheezing or shortness of breath.  Marland Kitchen  amLODipine (NORVASC) 2.5 MG tablet Take 2.5 mg by mouth daily.  Marland Kitchen aspirin 81 MG EC tablet Take 81 mg by mouth daily after lunch.  . busPIRone (BUSPAR) 15 MG tablet Take 15 mg by mouth 2 (two) times daily.  Marland Kitchen docusate sodium (COLACE) 100 MG capsule Take 100 mg by mouth 3 (three) times daily.  . Fluticasone-Umeclidin-Vilant (TRELEGY ELLIPTA) 100-62.5-25 MCG/INH AEPB Inhale 1 puff into the lungs daily.  . folic acid (FOLVITE) 1 MG tablet Take 1 mg by mouth daily after lunch.  . furosemide (LASIX) 40 MG tablet Take 1 tablet (40 mg total) by mouth daily.  Marland Kitchen gabapentin (NEURONTIN) 800 MG tablet Take 800 mg by mouth 2 (two) times daily.  Marland Kitchen ipratropium-albuterol (DUONEB) 0.5-2.5 (3) MG/3ML SOLN Take 3 mLs by nebulization every 6 (six) hours as needed (Shortness of breath).  . latanoprost (XALATAN) 0.005 % ophthalmic solution Place 1 drop into both eyes at bedtime.  . metaxalone (SKELAXIN) 800 MG tablet TAKE 1 TABLET (800 MG TOTAL) BY MOUTH 3 (THREE) TIMES DAILY AS NEEDED FOR MUSCLE SPASMS.  . modafinil (PROVIGIL) 100 MG tablet Take 100 mg by mouth daily.  . Multiple Vitamins-Minerals (HAIR/SKIN/NAILS/BIOTIN) TABS Take 1 tablet by mouth daily after lunch.  Marland Kitchen omeprazole (PRILOSEC) 20 MG capsule Take by mouth daily.  . OXYGEN Inhale 4 L into the lungs continuous.  . potassium chloride 20 MEQ TBCR Take 20 mEq by mouth daily.  . rizatriptan (MAXALT-MLT) 10 MG disintegrating tablet Take 10 mg by mouth as needed for migraine. May repeat in 2 hours if needed  . simvastatin (ZOCOR) 10 MG tablet Take 10 mg by mouth at bedtime.  Marland Kitchen tiZANidine (ZANAFLEX) 4 MG tablet TAKE 1 TABLET (4 MG TOTAL) BY MOUTH AT BEDTIME AS NEEDED FOR MUSCLE SPASMS.  Marland Kitchen topiramate (TOPAMAX) 25 MG tablet Take 50 mg by mouth daily.   . traZODone (DESYREL) 100 MG tablet Take 100 mg by mouth at bedtime.   . vitamin B-12 (CYANOCOBALAMIN) 1000 MCG tablet Take 1,000 mcg by mouth daily after lunch.   No current facility-administered medications on  file prior to visit.     Cardiac medications: furosemide 40 mg daily, aspirin 81 mg daily, amlodipine 2.5 mg daily, simvastatin 10 mg daily.  Allergies:   Citrus and Lisinopril   Social History   Socioeconomic History  . Marital status: Married    Spouse name: Not on file  . Number of children: Not on file  . Years of education: Not on file  . Highest education level: Not on file  Occupational History  . Not on file  Social Needs  . Financial resource strain: Not on file  . Food insecurity:    Worry: Not on file    Inability: Not on file  . Transportation needs:    Medical: Not on file    Non-medical: Not on file  Tobacco Use  .  Smoking status: Former Smoker    Packs/day: 1.00    Years: 39.00    Pack years: 39.00    Types: Cigarettes    Last attempt to quit: 05/08/2018    Years since quitting: 0.1  . Smokeless tobacco: Never Used  Substance and Sexual Activity  . Alcohol use: No    Frequency: Never  . Drug use: No  . Sexual activity: Not on file  Lifestyle  . Physical activity:    Days per week: Not on file    Minutes per session: Not on file  . Stress: Not on file  Relationships  . Social connections:    Talks on phone: Not on file    Gets together: Not on file    Attends religious service: Not on file    Active member of club or organization: Not on file    Attends meetings of clubs or organizations: Not on file    Relationship status: Not on file  Other Topics Concern  . Not on file  Social History Narrative  . Not on file     Family History: The patient's family history includes CAD in her mother; Diabetes in her father, mother, and sister; Hypertension in her mother; Lung cancer in her father. No family history of use of oxygen, no heart failure.  ROS:   Please see the history of present illness.  Additional pertinent ROS: Review of Systems  Constitutional: Negative for chills and fever.       Positive for sweating  HENT: Negative for ear pain and  hearing loss.   Eyes: Negative for blurred vision and pain.  Respiratory: Positive for shortness of breath. Negative for cough, hemoptysis and wheezing.   Cardiovascular: Negative for chest pain, palpitations, orthopnea, claudication, leg swelling and PND.  Gastrointestinal: Negative for abdominal pain, blood in stool and melena.  Genitourinary: Negative for dysuria and hematuria.  Musculoskeletal: Positive for myalgias. Negative for falls.  Skin: Negative for itching.  Neurological: Positive for loss of consciousness and headaches. Negative for focal weakness.  Endo/Heme/Allergies: Bruises/bleeds easily.    EKGs/Labs/Other Studies Reviewed:    The following studies were reviewed today: [facility] 01/27/17  Mid RCA lesion, 20 %stenosed.  The left ventricular ejection fraction is 55-65% by visual estimate.  The left ventricular systolic function is normal.  LV end diastolic pressure is normal.   Moderate right sided heart pressure elevation with moderate pulmonary hypertension.  Normal systolic function with moderate left ventricular hypertrophy with EF estimate of approximately 60%.  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  RECOMMENDATION: Medical therapy  Right Heart Pressures RA: A wave 15, V-wave 13, mean 12 RV: 55/14 PA: 55/19 PW: mean16  AO: 134/72 PA: 55/19; mean 33  LV: 131/18 PW: a 19; v 17; mean 16  LV: 114/17 AO: 114/66; mean 84  Oxygen saturation 92% in the aorta and 68% in the pulmonary artery.  Cardiac output by the thermodilution method 6.5 and by the Fick method 6.2 L/m. Cardiac index 3.6 and 3.3 L/m/m, respectively   Echo 05/28/18 Study Conclusions  - Left ventricle: The cavity size was normal. Wall thickness was   increased in a pattern of mild LVH. Systolic function was normal.   The estimated ejection fraction was in the range of 50% to 55%.   Wall motion was normal;  there were no regional wall motion   abnormalities. Indeterminate diastolic function. - Aortic valve: Mildly calcified annulus.  Trileaflet. - Mitral valve: Mildly calcified annulus. There was trivial   regurgitation. - Left atrium: The atrium was at the upper limits of normal in   size. - Right ventricle: The cavity size was moderately dilated. Systolic   function was severely reduced. - Right atrium: The atrium was mildly dilated. Central venous   pressure (est): 3 mm Hg. - Tricuspid valve: There was mild regurgitation. - Pulmonary arteries: PA peak pressure: 25 mm Hg (S). - Pericardium, extracardiac: A small pericardial effusion was   identified posterior to the heart.  EKG:  EKG is ordered today.  The ekg ordered today demonstrates normal sinus rhythm with nonspecific ST changes. Does not meet strict criteria for RAD/RVH.  Recent Labs: 08/13/2017: Magnesium 1.8 09/30/2017: ALT 32 05/27/2018: B Natriuretic Peptide 510.0; Hemoglobin 10.7; Platelets 124 05/29/2018: BUN 15; Creatinine, Ser 0.95; Potassium 3.7; Sodium 143  Recent Lipid Panel    Component Value Date/Time   CHOL 144 05/28/2018 0629   TRIG 147 05/28/2018 0629   HDL 31 (L) 05/28/2018 0629   CHOLHDL 4.6 05/28/2018 0629   VLDL 29 05/28/2018 0629   LDLCALC 84 05/28/2018 0629    Physical Exam:    VS:  BP 116/83   Pulse 87   Ht 5\' 6"  (1.676 m)   Wt 206 lb 6.4 oz (93.6 kg)   LMP 08/19/2012   BMI 33.31 kg/m     Wt Readings from Last 3 Encounters:  07/11/18 206 lb 6.4 oz (93.6 kg)  06/19/18 203 lb (92.1 kg)  06/05/18 200 lb (90.7 kg)     GEN: Well nourished, well developed in no acute distress. Nasal cannula in place HEENT: Normal NECK: JVD 8 cm, supple neck LYMPHATICS: No lymphadenopathy CARDIAC: regular rhythm, normal S1 and S2, no murmurs, rubs, gallops. Radial and DP pulses 2+ bilaterally. RESPIRATORY:  Somewhat distant but clear to auscultation without rales, wheezing or rhonchi  ABDOMEN: Soft, non-tender,  non-distended MUSCULOSKELETAL:  No edema; No deformity  SKIN: Warm and dry NEUROLOGIC:  Alert and oriented x 3 PSYCHIATRIC:  Normal affect   ASSESSMENT:    1. Chronic diastolic (congestive) heart failure ([place])   2. Shortness of breath   3. Pulmonary hypertension, unspecified ([place])   4. Essential hypertension   5. Chronic respiratory failure with hypoxia ([facility])   6. Chronic obstructive pulmonary disease, unspecified COPD type ([place])   7. OSA (obstructive sleep apnea)    PLAN:    1. Chronic diastolic heart failure vs. Pulmonary hypertension as the etiology for her shortness of breath and chronic hypoxia: See my comments in the HPI re: her echo. I think that her PASP is underestimated on her echo due to lack of a complete TR jet. The RV enlargement and presence of a pericardial effusion are concerning to me, and the septal flattening also suggests that her RV pressure is higher than estimated.  I suspect that she may have pulmonary hypertension, either secondary to her lung disease or possibly due to another unclear etiology. I don't expect given her LV function and coronaries that this is due to left sided heart disease as a primary cause.   Her syncope and pericardial effusion are worrisome symptoms. I have contacted my colleague Dr. Haroldine Laws and asked him to do a right heart catheterization to better understand her filling pressures.   If she does have pressures suggestive of pulmonary hypertension, then we will have her evaluated in advanced heart failure clinic to discuss medical management options.  2. OSA: uses CPAP 3.  COPD: followed by Dr. Elsworth Soho, see note above for pulmonary function tests  Plan for follow up: [facility] likely next week, follow up in 6-8 weeks.  Medication Adjustments/Labs and Tests Ordered: Current medicines are reviewed at length with the patient today.  Concerns regarding medicines are outlined above.  Orders Placed This Encounter  Procedures  . EKG 12-Lead   No  orders of the defined types were placed in this encounter.   Patient Instructions  Medication Instructions: Your physician recommends that you continue on your current medications as directed.    If you need a refill on your cardiac medications before your next appointment, please call your pharmacy.   Labwork: None   Procedures/Testing: None  Follow-Up: Your physician wants you to follow-up in 6-8 weeks with Dr. Harrell Gave.   Special Instructions:    Thank you for choosing [facility] at [facility]!!       Signed, Buford Dresser, MD PhD 07/11/2018 1:19 PM    [place]

## 2018-07-10 NOTE — Progress Notes (Signed)
Cardiology Office Note:    Date:  07/11/2018   ID:  Teresa Roberts, DOB 11-11-1961, MRN [id number]  PCP:  Jonathon Jordan, MD  Cardiologist:  Buford Dresser, MD PhD  Referring MD: Jonathon Jordan, MD   Chief Complaint  Patient presents with  . New Patient (Initial Visit)  Shortness of breath  History of Present Illness:    Teresa Roberts is a 57 y.o. female with a hx of fibromyalgia, hypertension, COPD, chronic diastolic heart failure, nononbstructive CAD who is seen as a new consult at the request of Dr. Stephanie Acre for evaluation and management of hypertension and chronic diastolic heart failure  Patient concerns:  1) blood pressure controlled at today's visit, feels like it is under control.  2) Fluid--started water pills in July, has been going to the bathroom a lot but no change to her breathing. Never gets swelling in her feet, sometimes in her abdomen. Sleeps on one pillow stably, sleeps with CPAP and sometimes she wakes up because of that. Wakes up 3-4 times/night to use the restroom.   She has had multiple admissions, most recently on 05/27/18, for shortness of breath. In 01/2017 she was admitted and discharged on 3L home O2. CTA negative for PE that admission. In 07/2017 she had hypercarbic respiratory failure with sepsis.  COPD notable for spirometry 02/15/2017: FEV1 52% pred, FVC 60% pred, FEV1/FVC 85% predicted. She was still smoking at the time of these PFTs.  Had an episode when she blacked out on a plane. Not sure what happened, was sitting in her seat and lost consciousness. Plan was getting ready to land at the time, she woke up in an ambulance. Was told it was CHF and pulmonary edema. Was in the hospital for two days in [place]; did ok on the flight home. Had another episode in September of last year when she was septic; lost consciousness.   Today feels "bad." Has a headache, feels sluggish. No fevers, does sweat a lot at night. No chills. No significant cough. Did quit smoking  several months ago, has not improved her breathing. Is followed by Dr. Elsworth Soho in pulmonary.  On my review of her most recent echo, she has a trivial pericardial effusion. Her RV is enlarged, actually appearing larger than her LV on her apical views. Her septum is flattened and has septal bounce. Her apical RV is trabeculated and hypocontractile, though her TAPSE at the base is normal. Her TR is trivial to mild. I do not think the RVSP is accurate, as the TR jet is not complete. So while her RA-RV gradient was thought to be only 22 mmHg, I think this is likely underrepresented. Her PASP was estimated at 25 mmHg, and I think this is low.  Denies recent chest pain, other than sharp discomfort when changing position.   Past Medical History:  Diagnosis Date  . CAD (coronary artery disease)    a. 01/2017: cath showing 20% mid RCA stenosis and otherwise normal LAD and LCx with a preserved EF of 55 to 60%; mod pulmonary HTN  . COPD (chronic obstructive pulmonary disease) ([place])    O2 dependent  . cpap   . Depression    PTSD  . Diastolic CHF ([place])   . Fibromyalgia   . Hyperlipidemia   . Hypertension   . Migraines     Past Surgical History:  Procedure Laterality Date  . APPENDECTOMY    . CESAREAN SECTION    . ORIF ANKLE FRACTURE Left 08/12/2017   Procedure: OPEN REDUCTION  INTERNAL FIXATION (ORIF) ANKLE FRACTURE;  Surgeon: Rod Can, MD;  Location: [facility];  Service: Orthopedics;  Laterality: Left;  . RIGHT/LEFT HEART CATH AND CORONARY ANGIOGRAPHY N/A 01/27/2017   Procedure: Right/Left Heart Cath and Coronary Angiography;  Surgeon: Troy Sine, MD;  Location: [facility];  Service: Cardiovascular;  Laterality: N/A;    Current Medications: Current Outpatient Medications on File Prior to Visit  Medication Sig  . albuterol (PROVENTIL) (2.5 MG/3ML) 0.083% nebulizer solution Take 3 mLs (2.5 mg total) by nebulization every 6 (six) hours as needed for wheezing or shortness of breath.  Marland Kitchen  amLODipine (NORVASC) 2.5 MG tablet Take 2.5 mg by mouth daily.  Marland Kitchen aspirin 81 MG EC tablet Take 81 mg by mouth daily after lunch.  . busPIRone (BUSPAR) 15 MG tablet Take 15 mg by mouth 2 (two) times daily.  Marland Kitchen docusate sodium (COLACE) 100 MG capsule Take 100 mg by mouth 3 (three) times daily.  . Fluticasone-Umeclidin-Vilant (TRELEGY ELLIPTA) 100-62.5-25 MCG/INH AEPB Inhale 1 puff into the lungs daily.  . folic acid (FOLVITE) 1 MG tablet Take 1 mg by mouth daily after lunch.  . furosemide (LASIX) 40 MG tablet Take 1 tablet (40 mg total) by mouth daily.  Marland Kitchen gabapentin (NEURONTIN) 800 MG tablet Take 800 mg by mouth 2 (two) times daily.  Marland Kitchen ipratropium-albuterol (DUONEB) 0.5-2.5 (3) MG/3ML SOLN Take 3 mLs by nebulization every 6 (six) hours as needed (Shortness of breath).  . latanoprost (XALATAN) 0.005 % ophthalmic solution Place 1 drop into both eyes at bedtime.  . metaxalone (SKELAXIN) 800 MG tablet TAKE 1 TABLET (800 MG TOTAL) BY MOUTH 3 (THREE) TIMES DAILY AS NEEDED FOR MUSCLE SPASMS.  . modafinil (PROVIGIL) 100 MG tablet Take 100 mg by mouth daily.  . Multiple Vitamins-Minerals (HAIR/SKIN/NAILS/BIOTIN) TABS Take 1 tablet by mouth daily after lunch.  Marland Kitchen omeprazole (PRILOSEC) 20 MG capsule Take by mouth daily.  . OXYGEN Inhale 4 L into the lungs continuous.  . potassium chloride 20 MEQ TBCR Take 20 mEq by mouth daily.  . rizatriptan (MAXALT-MLT) 10 MG disintegrating tablet Take 10 mg by mouth as needed for migraine. May repeat in 2 hours if needed  . simvastatin (ZOCOR) 10 MG tablet Take 10 mg by mouth at bedtime.  Marland Kitchen tiZANidine (ZANAFLEX) 4 MG tablet TAKE 1 TABLET (4 MG TOTAL) BY MOUTH AT BEDTIME AS NEEDED FOR MUSCLE SPASMS.  Marland Kitchen topiramate (TOPAMAX) 25 MG tablet Take 50 mg by mouth daily.   . traZODone (DESYREL) 100 MG tablet Take 100 mg by mouth at bedtime.   . vitamin B-12 (CYANOCOBALAMIN) 1000 MCG tablet Take 1,000 mcg by mouth daily after lunch.   No current facility-administered medications on  file prior to visit.     Cardiac medications: furosemide 40 mg daily, aspirin 81 mg daily, amlodipine 2.5 mg daily, simvastatin 10 mg daily.  Allergies:   Citrus and Lisinopril   Social History   Socioeconomic History  . Marital status: Married    Spouse name: Not on file  . Number of children: Not on file  . Years of education: Not on file  . Highest education level: Not on file  Occupational History  . Not on file  Social Needs  . Financial resource strain: Not on file  . Food insecurity:    Worry: Not on file    Inability: Not on file  . Transportation needs:    Medical: Not on file    Non-medical: Not on file  Tobacco Use  .  Smoking status: Former Smoker    Packs/day: 1.00    Years: 39.00    Pack years: 39.00    Types: Cigarettes    Last attempt to quit: 05/08/2018    Years since quitting: 0.1  . Smokeless tobacco: Never Used  Substance and Sexual Activity  . Alcohol use: No    Frequency: Never  . Drug use: No  . Sexual activity: Not on file  Lifestyle  . Physical activity:    Days per week: Not on file    Minutes per session: Not on file  . Stress: Not on file  Relationships  . Social connections:    Talks on phone: Not on file    Gets together: Not on file    Attends religious service: Not on file    Active member of club or organization: Not on file    Attends meetings of clubs or organizations: Not on file    Relationship status: Not on file  Other Topics Concern  . Not on file  Social History Narrative  . Not on file     Family History: The patient's family history includes CAD in her mother; Diabetes in her father, mother, and sister; Hypertension in her mother; Lung cancer in her father. No family history of use of oxygen, no heart failure.  ROS:   Please see the history of present illness.  Additional pertinent ROS: Review of Systems  Constitutional: Negative for chills and fever.       Positive for sweating  HENT: Negative for ear pain and  hearing loss.   Eyes: Negative for blurred vision and pain.  Respiratory: Positive for shortness of breath. Negative for cough, hemoptysis and wheezing.   Cardiovascular: Negative for chest pain, palpitations, orthopnea, claudication, leg swelling and PND.  Gastrointestinal: Negative for abdominal pain, blood in stool and melena.  Genitourinary: Negative for dysuria and hematuria.  Musculoskeletal: Positive for myalgias. Negative for falls.  Skin: Negative for itching.  Neurological: Positive for loss of consciousness and headaches. Negative for focal weakness.  Endo/Heme/Allergies: Bruises/bleeds easily.    EKGs/Labs/Other Studies Reviewed:    The following studies were reviewed today: [facility] 01/27/17  Mid RCA lesion, 20 %stenosed.  The left ventricular ejection fraction is 55-65% by visual estimate.  The left ventricular systolic function is normal.  LV end diastolic pressure is normal.   Moderate right sided heart pressure elevation with moderate pulmonary hypertension.  Normal systolic function with moderate left ventricular hypertrophy with EF estimate of approximately 60%.  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  RECOMMENDATION: Medical therapy  Right Heart Pressures RA: A wave 15, V-wave 13, mean 12 RV: 55/14 PA: 55/19 PW: mean16  AO: 134/72 PA: 55/19; mean 33  LV: 131/18 PW: a 19; v 17; mean 16  LV: 114/17 AO: 114/66; mean 84  Oxygen saturation 92% in the aorta and 68% in the pulmonary artery.  Cardiac output by the thermodilution method 6.5 and by the Fick method 6.2 L/m. Cardiac index 3.6 and 3.3 L/m/m, respectively   Echo 05/28/18 Study Conclusions  - Left ventricle: The cavity size was normal. Wall thickness was   increased in a pattern of mild LVH. Systolic function was normal.   The estimated ejection fraction was in the range of 50% to 55%.   Wall motion was normal;  there were no regional wall motion   abnormalities. Indeterminate diastolic function. - Aortic valve: Mildly calcified annulus.  Trileaflet. - Mitral valve: Mildly calcified annulus. There was trivial   regurgitation. - Left atrium: The atrium was at the upper limits of normal in   size. - Right ventricle: The cavity size was moderately dilated. Systolic   function was severely reduced. - Right atrium: The atrium was mildly dilated. Central venous   pressure (est): 3 mm Hg. - Tricuspid valve: There was mild regurgitation. - Pulmonary arteries: PA peak pressure: 25 mm Hg (S). - Pericardium, extracardiac: A small pericardial effusion was   identified posterior to the heart.  EKG:  EKG is ordered today.  The ekg ordered today demonstrates normal sinus rhythm with nonspecific ST changes. Does not meet strict criteria for RAD/RVH.  Recent Labs: 08/13/2017: Magnesium 1.8 09/30/2017: ALT 32 05/27/2018: B Natriuretic Peptide 510.0; Hemoglobin 10.7; Platelets 124 05/29/2018: BUN 15; Creatinine, Ser 0.95; Potassium 3.7; Sodium 143  Recent Lipid Panel    Component Value Date/Time   CHOL 144 05/28/2018 0629   TRIG 147 05/28/2018 0629   HDL 31 (L) 05/28/2018 0629   CHOLHDL 4.6 05/28/2018 0629   VLDL 29 05/28/2018 0629   LDLCALC 84 05/28/2018 0629    Physical Exam:    VS:  BP 116/83   Pulse 87   Ht 5\' 6"  (1.676 m)   Wt 206 lb 6.4 oz (93.6 kg)   LMP 08/19/2012   BMI 33.31 kg/m     Wt Readings from Last 3 Encounters:  07/11/18 206 lb 6.4 oz (93.6 kg)  06/19/18 203 lb (92.1 kg)  06/05/18 200 lb (90.7 kg)     GEN: Well nourished, well developed in no acute distress. Nasal cannula in place HEENT: Normal NECK: JVD 8 cm, supple neck LYMPHATICS: No lymphadenopathy CARDIAC: regular rhythm, normal S1 and S2, no murmurs, rubs, gallops. Radial and DP pulses 2+ bilaterally. RESPIRATORY:  Somewhat distant but clear to auscultation without rales, wheezing or rhonchi  ABDOMEN: Soft, non-tender,  non-distended MUSCULOSKELETAL:  No edema; No deformity  SKIN: Warm and dry NEUROLOGIC:  Alert and oriented x 3 PSYCHIATRIC:  Normal affect   ASSESSMENT:    1. Chronic diastolic (congestive) heart failure ([place])   2. Shortness of breath   3. Pulmonary hypertension, unspecified ([place])   4. Essential hypertension   5. Chronic respiratory failure with hypoxia ([facility])   6. Chronic obstructive pulmonary disease, unspecified COPD type ([place])   7. OSA (obstructive sleep apnea)    PLAN:    1. Chronic diastolic heart failure vs. Pulmonary hypertension as the etiology for her shortness of breath and chronic hypoxia: See my comments in the HPI re: her echo. I think that her PASP is underestimated on her echo due to lack of a complete TR jet. The RV enlargement and presence of a pericardial effusion are concerning to me, and the septal flattening also suggests that her RV pressure is higher than estimated.  I suspect that she may have pulmonary hypertension, either secondary to her lung disease or possibly due to another unclear etiology. I don't expect given her LV function and coronaries that this is due to left sided heart disease as a primary cause.   Her syncope and pericardial effusion are worrisome symptoms. I have contacted my colleague Dr. Haroldine Laws and asked him to do a right heart catheterization to better understand her filling pressures.   If she does have pressures suggestive of pulmonary hypertension, then we will have her evaluated in advanced heart failure clinic to discuss medical management options.  2. OSA: uses CPAP 3.  COPD: followed by Dr. Elsworth Soho, see note above for pulmonary function tests  Plan for follow up: [facility] likely next week, follow up in 6-8 weeks.  Medication Adjustments/Labs and Tests Ordered: Current medicines are reviewed at length with the patient today.  Concerns regarding medicines are outlined above.  Orders Placed This Encounter  Procedures  . EKG 12-Lead   No  orders of the defined types were placed in this encounter.   Patient Instructions  Medication Instructions: Your physician recommends that you continue on your current medications as directed.    If you need a refill on your cardiac medications before your next appointment, please call your pharmacy.   Labwork: None   Procedures/Testing: None  Follow-Up: Your physician wants you to follow-up in 6-8 weeks with Dr. Harrell Gave.   Special Instructions:    Thank you for choosing Heartcare at [facility]!!       Signed, Buford Dresser, MD PhD 07/11/2018 1:19 PM    [place]

## 2018-07-11 ENCOUNTER — Telehealth (HOSPITAL_COMMUNITY): Payer: Self-pay

## 2018-07-11 ENCOUNTER — Encounter: Payer: Self-pay | Admitting: Cardiology

## 2018-07-11 ENCOUNTER — Ambulatory Visit (INDEPENDENT_AMBULATORY_CARE_PROVIDER_SITE_OTHER): Payer: 59 | Admitting: Cardiology

## 2018-07-11 VITALS — BP 116/83 | HR 87 | Ht 66.0 in | Wt 206.4 lb

## 2018-07-11 DIAGNOSIS — I5032 Chronic diastolic (congestive) heart failure: Secondary | ICD-10-CM | POA: Diagnosis not present

## 2018-07-11 DIAGNOSIS — I272 Pulmonary hypertension, unspecified: Secondary | ICD-10-CM

## 2018-07-11 DIAGNOSIS — G4733 Obstructive sleep apnea (adult) (pediatric): Secondary | ICD-10-CM

## 2018-07-11 DIAGNOSIS — J9611 Chronic respiratory failure with hypoxia: Secondary | ICD-10-CM

## 2018-07-11 DIAGNOSIS — Z01812 Encounter for preprocedural laboratory examination: Secondary | ICD-10-CM

## 2018-07-11 DIAGNOSIS — J449 Chronic obstructive pulmonary disease, unspecified: Secondary | ICD-10-CM

## 2018-07-11 DIAGNOSIS — R0602 Shortness of breath: Secondary | ICD-10-CM

## 2018-07-11 DIAGNOSIS — I10 Essential (primary) hypertension: Secondary | ICD-10-CM

## 2018-07-11 NOTE — Patient Instructions (Addendum)
Medication Instructions: Your physician recommends that you continue on your current medications as directed.    If you need a refill on your cardiac medications before your next appointment, please call your pharmacy.   Labwork: None   Procedures/Testing: Your physician has requested that you have a cardiac catheterization. Cardiac catheterization is used to diagnose and/or treat various heart conditions. Doctors may recommend this procedure for a number of different reasons. The most common reason is to evaluate chest pain. Chest pain can be a symptom of coronary artery disease (CAD), and cardiac catheterization can show whether plaque is narrowing or blocking your heart's arteries. This procedure is also used to evaluate the valves, as well as measure the blood flow and oxygen levels in different parts of your heart. For further information please visit [email or web address]. Please follow instruction sheet, as given.    Follow-Up: Your physician wants you to follow-up in 6-8 weeks with Dr. Harrell Gave.   Special Instructions:               [facility] [address] [place] [address] [place] [address] Dept: [phone] Loc: [phone]  Teresa Roberts  07/11/2018  You are scheduled for a Cardiac Catheterization on Wednesday, September 4 with Dr. Glori Bickers.  1. Please arrive at the [facility] (Main Entrance A) at [facility]: [address] [place], [place] [address] at 8:00 AM (This time is two hours before your procedure to ensure your preparation). Free valet parking service is available.   Special note: Every effort is made to have your procedure done on time. Please understand that emergencies sometimes delay scheduled procedures.  2. Diet: Do not eat solid foods after midnight.  The patient may have clear liquids until 5am upon the day of the procedure.  3. Labs: You will need  to have blood drawn between 07/12/18-07/14/18 4. Medication instructions in preparation for your procedure:  Stop taking, Lasix (Furosemide)  on Wednesday, September 4.     On the morning of your procedure, take your Aspirin and any morning medicines NOT listed above.  You may use sips of water.  5. Plan for one night stay--bring personal belongings. 6. Bring a current list of your medications and current insurance cards. 7. You MUST have a responsible person to drive you home. 8. Someone MUST be with you the first 24 hours after you arrive home or your discharge will be delayed. 9. Please wear clothes that are easy to get on and off and wear slip-on shoes.  Thank you for allowing [place] to care for you!   --  Invasive Cardiovascular services  Thank you for choosing Heartcare at [facility]!!

## 2018-07-11 NOTE — Telephone Encounter (Signed)
Called patient to reschedule PR, pt will come in for orientation on 07/28/18 2 9:30AM and will attend the 1:30PM exercise class time.

## 2018-07-12 DIAGNOSIS — Z01812 Encounter for preprocedural laboratory examination: Secondary | ICD-10-CM | POA: Diagnosis not present

## 2018-07-13 LAB — CBC
HEMATOCRIT: 44 % (ref 34.0–46.6)
Hemoglobin: 12.7 g/dL (ref 11.1–15.9)
MCH: 20.7 pg — AB (ref 26.6–33.0)
MCHC: 28.9 g/dL — AB (ref 31.5–35.7)
MCV: 72 fL — AB (ref 79–97)
Platelets: 206 10*3/uL (ref 150–450)
RBC: 6.14 x10E6/uL — ABNORMAL HIGH (ref 3.77–5.28)
RDW: 22.2 % — ABNORMAL HIGH (ref 12.3–15.4)
WBC: 7.4 10*3/uL (ref 3.4–10.8)

## 2018-07-13 LAB — BASIC METABOLIC PANEL
BUN/Creatinine Ratio: 14 (ref 9–23)
BUN: 17 mg/dL (ref 6–24)
CO2: 24 mmol/L (ref 20–29)
Calcium: 10.9 mg/dL — ABNORMAL HIGH (ref 8.7–10.2)
Chloride: 99 mmol/L (ref 96–106)
Creatinine, Ser: 1.24 mg/dL — ABNORMAL HIGH (ref 0.57–1.00)
GFR, EST AFRICAN AMERICAN: 56 mL/min/{1.73_m2} — AB (ref >59)
GFR, EST NON AFRICAN AMERICAN: 49 mL/min/{1.73_m2} — AB (ref >59)
Glucose: 86 mg/dL (ref 65–99)
Potassium: 4.1 mmol/L (ref 3.5–5.2)
SODIUM: 143 mmol/L (ref 134–144)

## 2018-07-14 ENCOUNTER — Ambulatory Visit (HOSPITAL_COMMUNITY): Payer: 59

## 2018-07-19 ENCOUNTER — Encounter (HOSPITAL_COMMUNITY)
Admission: RE | Disposition: A | Discharge status: Home / Self Care | Payer: Self-pay | Source: Ambulatory Visit | Attending: Internal Medicine

## 2018-07-19 ENCOUNTER — Ambulatory Visit (HOSPITAL_COMMUNITY)
Admission: RE | Admit: 2018-07-19 | Discharge: 2018-07-19 | Disposition: A | Discharge status: Home / Self Care | Payer: 59 | Source: Ambulatory Visit | Attending: Internal Medicine | Admitting: Internal Medicine

## 2018-07-19 ENCOUNTER — Encounter (HOSPITAL_COMMUNITY): Payer: Self-pay | Admitting: Internal Medicine

## 2018-07-19 ENCOUNTER — Other Ambulatory Visit: Payer: Self-pay

## 2018-07-19 DIAGNOSIS — I2510 Atherosclerotic heart disease of native coronary artery without angina pectoris: Secondary | ICD-10-CM | POA: Insufficient documentation

## 2018-07-19 DIAGNOSIS — J9611 Chronic respiratory failure with hypoxia: Secondary | ICD-10-CM | POA: Diagnosis not present

## 2018-07-19 DIAGNOSIS — Z8249 Family history of ischemic heart disease and other diseases of the circulatory system: Secondary | ICD-10-CM | POA: Diagnosis not present

## 2018-07-19 DIAGNOSIS — Z87891 Personal history of nicotine dependence: Secondary | ICD-10-CM | POA: Diagnosis not present

## 2018-07-19 DIAGNOSIS — I2721 Secondary pulmonary arterial hypertension: Secondary | ICD-10-CM

## 2018-07-19 DIAGNOSIS — F4310 Post-traumatic stress disorder, unspecified: Secondary | ICD-10-CM | POA: Insufficient documentation

## 2018-07-19 DIAGNOSIS — Z9981 Dependence on supplemental oxygen: Secondary | ICD-10-CM | POA: Diagnosis not present

## 2018-07-19 DIAGNOSIS — M797 Fibromyalgia: Secondary | ICD-10-CM | POA: Diagnosis not present

## 2018-07-19 DIAGNOSIS — I110 Hypertensive heart disease with heart failure: Secondary | ICD-10-CM | POA: Diagnosis not present

## 2018-07-19 DIAGNOSIS — Z7982 Long term (current) use of aspirin: Secondary | ICD-10-CM | POA: Diagnosis not present

## 2018-07-19 DIAGNOSIS — I5032 Chronic diastolic (congestive) heart failure: Secondary | ICD-10-CM | POA: Insufficient documentation

## 2018-07-19 DIAGNOSIS — G4733 Obstructive sleep apnea (adult) (pediatric): Secondary | ICD-10-CM | POA: Insufficient documentation

## 2018-07-19 DIAGNOSIS — E785 Hyperlipidemia, unspecified: Secondary | ICD-10-CM | POA: Insufficient documentation

## 2018-07-19 DIAGNOSIS — J449 Chronic obstructive pulmonary disease, unspecified: Secondary | ICD-10-CM | POA: Diagnosis not present

## 2018-07-19 DIAGNOSIS — F329 Major depressive disorder, single episode, unspecified: Secondary | ICD-10-CM | POA: Insufficient documentation

## 2018-07-19 HISTORY — PX: RIGHT HEART CATH: CATH118263

## 2018-07-19 LAB — POCT I-STAT 3, VENOUS BLOOD GAS (G3P V)
ACID-BASE EXCESS: 2 mmol/L (ref 0.0–2.0)
Acid-Base Excess: 1 mmol/L (ref 0.0–2.0)
Acid-Base Excess: 1 mmol/L (ref 0.0–2.0)
BICARBONATE: 28.4 mmol/L — AB (ref 20.0–28.0)
BICARBONATE: 28.7 mmol/L — AB (ref 20.0–28.0)
BICARBONATE: 29.6 mmol/L — AB (ref 20.0–28.0)
O2 SAT: 64 %
O2 SAT: 64 %
O2 SAT: 72 %
PCO2 VEN: 54.5 mmHg (ref 44.0–60.0)
PO2 VEN: 36 mmHg (ref 32.0–45.0)
TCO2: 30 mmol/L (ref 22–32)
TCO2: 30 mmol/L (ref 22–32)
TCO2: 31 mmol/L (ref 22–32)
pCO2, Ven: 56.6 mmHg (ref 44.0–60.0)
pCO2, Ven: 58.2 mmHg (ref 44.0–60.0)
pH, Ven: 7.313 (ref 7.250–7.430)
pH, Ven: 7.314 (ref 7.250–7.430)
pH, Ven: 7.326 (ref 7.250–7.430)
pO2, Ven: 37 mmHg (ref 32.0–45.0)
pO2, Ven: 42 mmHg (ref 32.0–45.0)

## 2018-07-19 SURGERY — RIGHT HEART CATH
Anesthesia: LOCAL

## 2018-07-19 MED ORDER — ASPIRIN 81 MG PO CHEW
81.0000 mg | CHEWABLE_TABLET | ORAL | Status: AC
  Administered 2018-07-19: 81 mg via ORAL

## 2018-07-19 MED ORDER — HEPARIN (PORCINE) IN NACL 1000-0.9 UT/500ML-% IV SOLN
INTRAVENOUS | Status: AC
  Filled 2018-07-19: qty 500

## 2018-07-19 MED ORDER — SODIUM CHLORIDE 0.9 % IV SOLN: 250.0000 mL | INTRAVENOUS | Status: DC | PRN

## 2018-07-19 MED ORDER — ASPIRIN 81 MG PO CHEW
CHEWABLE_TABLET | ORAL | Status: AC
  Filled 2018-07-19: qty 1

## 2018-07-19 MED ORDER — SODIUM CHLORIDE 0.9% FLUSH: 3.0000 mL | INTRAVENOUS | Status: DC | PRN

## 2018-07-19 MED ORDER — SODIUM CHLORIDE 0.9% FLUSH: 3.0000 mL | Freq: Two times a day (BID) | INTRAVENOUS | Status: DC

## 2018-07-19 MED ORDER — HEPARIN (PORCINE) IN NACL 1000-0.9 UT/500ML-% IV SOLN
INTRAVENOUS | Status: DC | PRN
  Administered 2018-07-19: 500 mL

## 2018-07-19 MED ORDER — ACETAMINOPHEN 325 MG PO TABS: 650.0000 mg | ORAL_TABLET | ORAL | Status: DC | PRN

## 2018-07-19 MED ORDER — SODIUM CHLORIDE 0.9 % IV SOLN
INTRAVENOUS | Status: DC
  Administered 2018-07-19: 09:00:00 via INTRAVENOUS

## 2018-07-19 MED ORDER — ONDANSETRON HCL 4 MG/2ML IJ SOLN: 4.0000 mg | Freq: Four times a day (QID) | INTRAMUSCULAR | Status: DC | PRN

## 2018-07-19 MED ORDER — LIDOCAINE HCL (PF) 1 % IJ SOLN
INTRAMUSCULAR | Status: AC
  Filled 2018-07-19: qty 30

## 2018-07-19 MED ORDER — LIDOCAINE HCL (PF) 1 % IJ SOLN
INTRAMUSCULAR | Status: DC | PRN
  Administered 2018-07-19: 5 mL

## 2018-07-19 SURGICAL SUPPLY — 8 items
CATH BALLN WEDGE 5F 110CM (CATHETERS) ×2 IMPLANT
KIT HEART LEFT (KITS) ×2 IMPLANT
PACK CARDIAC CATHETERIZATION (CUSTOM PROCEDURE TRAY) ×2 IMPLANT
PROTECTION STATION PRESSURIZED (MISCELLANEOUS) ×2
SHEATH GLIDE SLENDER 4/5FR (SHEATH) ×2 IMPLANT
STATION PROTECTION PRESSURIZED (MISCELLANEOUS) ×1 IMPLANT
TUBING ART PRESS 72  MALE/FEM (TUBING) ×1
TUBING ART PRESS 72 MALE/FEM (TUBING) ×1 IMPLANT

## 2018-07-19 NOTE — Discharge Instructions (Signed)
Brachial Site Care Refer to this sheet in the next few weeks. These instructions provide you with information about caring for yourself after your procedure. Your health care provider may also give you more specific instructions. Your treatment has been planned according to current medical practices, but problems sometimes occur. Call your health care provider if you have any problems or questions after your procedure. What can I expect after the procedure? After your procedure, it is typical to have the following:  Bruising at the brachial site that usually fades within 1-2 weeks.  Follow these instructions at home:  Take medicines only as directed by your health care provider.  You may shower 24-48 hours after the procedure or as directed by your health care provider. Remove the bandage (dressing) and gently wash the site with plain soap and water. Pat the area dry with a clean towel. Do not rub the site, because this may cause bleeding.  Do not take baths, swim, or use a hot tub until your health care provider approves.  Check your insertion site every day for redness, swelling, or drainage.  Do not apply powder or lotion to the site.  Do not flex or bend the affected arm for 24 hours or as directed by your health care provider.  Do not push or pull heavy objects with the affected arm for 24 hours or as directed by your health care provider.  Do not lift over 10 lb (4.5 kg) for 3 days after your procedure or as directed by your health care provider.  Ask your health care provider when it is okay to: ? Return to work or school. ? Resume usual physical activities or sports. ? Resume sexual activity.  Do not drive home if you are discharged the same day as the procedure. Have someone else drive you.  You may drive 24 hours after the procedure unless otherwise instructed by your health care provider.  Do not operate machinery or power tools for 24 hours after the procedure.  If  your procedure was done as an outpatient procedure, which means that you went home the same day as your procedure, a responsible adult should be with you for the first 24 hours after you arrive home.  Keep all follow-up visits as directed by your health care provider. This is important. Contact a health care provider if:  You have a fever.  You have chills.  You have increased bleeding from the brachial site. Hold pressure on the site. Get help right away if:  You have unusual pain at the brachial site.  You have redness, warmth, or swelling at the brachial site.  You have drainage (other than a small amount of blood on the dressing) from the brachial site.  The brachial site is bleeding, and the bleeding does not stop after 30 minutes of holding steady pressure on the site. This information is not intended to replace advice given to you by your health care provider. Make sure you discuss any questions you have with your health care provider. Document Released: 12/04/2010 Document Revised: 04/08/2016 Document Reviewed: 05/20/2014 Elsevier Interactive Patient Education  2018 [employer].

## 2018-07-19 NOTE — Interval H&P Note (Signed)
History and Physical Interval Note:  07/19/2018 9:37 AM  Teresa Roberts  has presented today for surgery, with the diagnosis of PAH  The various methods of treatment have been discussed with the patient and family. After consideration of risks, benefits and other options for treatment, the patient has consented to  Procedure(s): RIGHT HEART CATH (N/A) as a surgical intervention .  The patient's history has been reviewed, patient examined, no change in status, stable for surgery.  I have reviewed the patient's chart and labs.  Questions were answered to the patient's satisfaction.     Valicia Rief

## 2018-07-28 ENCOUNTER — Encounter (HOSPITAL_COMMUNITY): Payer: Self-pay

## 2018-07-28 ENCOUNTER — Encounter (HOSPITAL_COMMUNITY)
Admission: RE | Admit: 2018-07-28 | Discharge: 2018-07-28 | Disposition: A | Discharge status: Home / Self Care | Payer: 59 | Source: Ambulatory Visit | Attending: Pulmonary Disease | Admitting: Pulmonary Disease

## 2018-07-28 VITALS — BP 125/76 | HR 65 | Temp 97.5°F | Resp 16 | Ht 65.75 in | Wt 210.3 lb

## 2018-07-28 DIAGNOSIS — G43909 Migraine, unspecified, not intractable, without status migrainosus: Secondary | ICD-10-CM | POA: Insufficient documentation

## 2018-07-28 DIAGNOSIS — J9611 Chronic respiratory failure with hypoxia: Secondary | ICD-10-CM | POA: Diagnosis not present

## 2018-07-28 DIAGNOSIS — J449 Chronic obstructive pulmonary disease, unspecified: Secondary | ICD-10-CM | POA: Diagnosis not present

## 2018-07-28 DIAGNOSIS — E669 Obesity, unspecified: Secondary | ICD-10-CM | POA: Diagnosis not present

## 2018-07-28 DIAGNOSIS — Z6834 Body mass index (BMI) 34.0-34.9, adult: Secondary | ICD-10-CM | POA: Diagnosis not present

## 2018-07-28 DIAGNOSIS — F4310 Post-traumatic stress disorder, unspecified: Secondary | ICD-10-CM | POA: Diagnosis not present

## 2018-07-28 DIAGNOSIS — F17200 Nicotine dependence, unspecified, uncomplicated: Secondary | ICD-10-CM | POA: Insufficient documentation

## 2018-07-28 DIAGNOSIS — Z9981 Dependence on supplemental oxygen: Secondary | ICD-10-CM | POA: Diagnosis not present

## 2018-07-28 DIAGNOSIS — I5032 Chronic diastolic (congestive) heart failure: Secondary | ICD-10-CM | POA: Diagnosis not present

## 2018-07-28 DIAGNOSIS — I110 Hypertensive heart disease with heart failure: Secondary | ICD-10-CM | POA: Insufficient documentation

## 2018-07-28 DIAGNOSIS — I2510 Atherosclerotic heart disease of native coronary artery without angina pectoris: Secondary | ICD-10-CM | POA: Insufficient documentation

## 2018-07-28 DIAGNOSIS — E785 Hyperlipidemia, unspecified: Secondary | ICD-10-CM | POA: Diagnosis not present

## 2018-07-28 DIAGNOSIS — Z79899 Other long term (current) drug therapy: Secondary | ICD-10-CM | POA: Diagnosis not present

## 2018-07-28 DIAGNOSIS — Z7982 Long term (current) use of aspirin: Secondary | ICD-10-CM | POA: Diagnosis not present

## 2018-07-28 DIAGNOSIS — F329 Major depressive disorder, single episode, unspecified: Secondary | ICD-10-CM | POA: Diagnosis not present

## 2018-07-28 NOTE — Progress Notes (Signed)
Teresa Roberts 57 y.o. female Pulmonary Rehab Orientation Note [place] referred to Pulmonary Rehab by Dr. Elsworth Soho for Chronic Respiratory Failure with Hypoxia,  arrived today in Cardiac and Pulmonary Rehab for orientation to Pulmonary Rehab. She was transported from [employer] via wheel chair. She does carry portable oxygen. Per pt, she uses oxygen continuously. Pt has resumed smoking about 1/2 pack a day.  Pt understands safety of her oxygen use during the times she is smoking.  No one else in the home smokes. Color good, skin warm and dry. Patient is oriented to time and place. Patient's medical history, psychosocial health, and medications reviewed. Psychosocial assessment reveals pt lives with their spouse. Pt is currently unemployed, disabled. Pt hobbies include watching TV. Pt reports her stress level is high. Areas of stress/anxiety include Health.  Pt is worried about her health because "the doctors have been unable to pinpoint her lung disease or tell her why she passes out".  Pt exhibits signs of depression. Signs of depression include hopelessness and no sleep concerns at this time.. PHQ2/9 score 5/18.  Pt is on medication and feels it works ok. Pt has had counseling in the past and is open to counseling. Pt does not have anyone in particular. Advised pt that she may speak to a chaplain in the spiritual care department.  This person would not be able to provided medications but would be able to be non biased supportive person.  Pt shows fair  coping skills with negative outlook due to the uncertainty of her health and her sincere desire to come off of her oxygen.  Talked with pt regarding realistic expectations. Pt verbalized that she is aware.  Camie was offered emotional support and reassurance. Will continue to monitor and evaluate progress toward psychosocial goal(s) of improved mental well being, adopt healthy and positive coping skill and just to feel "better" in general. Physical assessment reveals heart  rate is normal, breath sounds clear to auscultation, no wheezes, rales, or rhonchi, diminished. Grip strength equal, strong. Distal pulses palpable with trace swelling.  Pt is on Lasix.. Patient reports she does take medications as prescribed. Patient states she follows a Regular diet. The patient reports no specific efforts to gain or lose weight.Pt would like to lose some weight. Patient's weight will be monitored closely. Demonstration and practice of PLB using pulse oximeter. Patient able to return demonstration satisfactorily. Safety and hand hygiene in the exercise area reviewed with patient. Patient voices understanding of the information reviewed. Department expectations discussed with patient and achievable goals were set. The patient shows enthusiasm about attending the program and we look forward to working with this nice lady. The patient is scheduled for a 6 min walk test on 9/17and to begin exercise on 9/24 at 1:30. 45 minutes was spent on a variety of activities such as assessment of the patient, obtaining baseline data including height, weight, BMI, and grip strength, verifying medical history, allergies, and current medications, and teaching patient strategies for performing tasks with less respiratory effort with emphasis on pursed lip breathing. [id number] [place], BSN Cardiac and Pulmonary Rehab Nurse Navigator

## 2018-07-31 NOTE — Progress Notes (Signed)
Teresa Roberts 57 y.o. female  DOB: [birth year] MRN: [id number]           Nutrition Note 1. Chronic respiratory failure with hypoxia [facility])    Past Medical History:  Diagnosis Date  . CAD (coronary artery disease)    a. 01/2017: cath showing 20% mid RCA stenosis and otherwise normal LAD and LCx with a preserved EF of 55 to 60%; mod pulmonary HTN  . COPD (chronic obstructive pulmonary disease) ([place])    O2 dependent  . cpap   . Depression    PTSD  . Diastolic CHF ([place])   . Fibromyalgia   . Hyperlipidemia   . Hypertension   . Migraines    Meds reviewed.    Current Outpatient Medications (Cardiovascular):  .  amLODipine (NORVASC) 2.5 MG tablet, Take 2.5 mg by mouth daily. .  furosemide (LASIX) 40 MG tablet, Take 1 tablet (40 mg total) by mouth daily. .  simvastatin (ZOCOR) 10 MG tablet, Take 10 mg by mouth at bedtime.  Current Outpatient Medications (Respiratory):  .  albuterol (PROVENTIL) (2.5 MG/3ML) 0.083% nebulizer solution, Take 3 mLs (2.5 mg total) by nebulization every 6 (six) hours as needed for wheezing or shortness of breath. .  Fluticasone-Umeclidin-Vilant (TRELEGY ELLIPTA) 100-62.5-25 MCG/INH AEPB, Inhale 1 puff into the lungs daily. Marland Kitchen  ipratropium-albuterol (DUONEB) 0.5-2.5 (3) MG/3ML SOLN, Take 3 mLs by nebulization every 6 (six) hours as needed (Shortness of breath).  Current Outpatient Medications (Analgesics):  .  aspirin 81 MG EC tablet, Take 81 mg by mouth daily after lunch. .  rizatriptan (MAXALT-MLT) 10 MG disintegrating tablet, Take 10 mg by mouth as needed for migraine. May repeat in 2 hours if needed  Current Outpatient Medications (Hematological):  .  folic acid (FOLVITE) 1 MG tablet, Take 1 mg by mouth daily after lunch. .  vitamin B-12 (CYANOCOBALAMIN) 1000 MCG tablet, Take 1,000 mcg by mouth daily after lunch.  Current Outpatient Medications (Other):  .  busPIRone (BUSPAR) 15 MG tablet, Take 15 mg by mouth 2 (two) times daily. Marland Kitchen  docusate sodium (COLACE)  100 MG capsule, Take 100 mg by mouth 3 (three) times daily. Marland Kitchen  gabapentin (NEURONTIN) 800 MG tablet, Take 800 mg by mouth 2 (two) times daily. Marland Kitchen  latanoprost (XALATAN) 0.005 % ophthalmic solution, Place 1 drop into both eyes at bedtime. .  metaxalone (SKELAXIN) 800 MG tablet, TAKE 1 TABLET (800 MG TOTAL) BY MOUTH 3 (THREE) TIMES DAILY AS NEEDED FOR MUSCLE SPASMS. (Patient taking differently: Take 800 mg by mouth 3 (three) times daily as needed for muscle spasms. ) .  modafinil (PROVIGIL) 100 MG tablet, Take 100 mg by mouth daily. .  Multiple Vitamins-Minerals (HAIR/SKIN/NAILS/BIOTIN) TABS, Take 1 tablet by mouth daily after lunch. Marland Kitchen  omeprazole (PRILOSEC) 20 MG capsule, Take by mouth daily. .  OXYGEN, Inhale 4 L into the lungs continuous. .  potassium chloride 20 MEQ TBCR, Take 20 mEq by mouth daily. Marland Kitchen  tiZANidine (ZANAFLEX) 4 MG tablet, TAKE 1 TABLET (4 MG TOTAL) BY MOUTH AT BEDTIME AS NEEDED FOR MUSCLE SPASMS. Marland Kitchen  topiramate (TOPAMAX) 25 MG tablet, Take 50 mg by mouth daily.  .  traZODone (DESYREL) 100 MG tablet, Take 100 mg by mouth at bedtime.    Ht: Ht Readings from Last 1 Encounters:  07/28/18 5' 5.75" (1.67 m)     Wt:  Wt Readings from Last 3 Encounters:  07/28/18 210 lb 5.1 oz (95.4 kg)  07/19/18 200 lb (90.7 kg)  07/11/18 206 lb 6.4  oz (93.6 kg)     BMI: Body mass index is 34.2 kg/m.    Current tobacco use? No    Labs:  Lipid Panel     Component Value Date/Time   CHOL 144 05/28/2018 0629   TRIG 147 05/28/2018 0629   HDL 31 (L) 05/28/2018 0629   CHOLHDL 4.6 05/28/2018 0629   VLDL 29 05/28/2018 0629   LDLCALC 84 05/28/2018 0629    Lab Results  Component Value Date   HGBA1C 6.3 (H) 05/28/2018    Nutrition Diagnosis ? Overweight/obesity related to excessive energy intake as evidenced by a BMI of Body mass index is 34.2 kg/m.    Goal(s)  1. Pt to identify and limit food sources of sodium,saturated fats, trans fats and refined carbohydrates 2. The pt will  have family and friends shop for food when necessary so that nourishing foods are always available at home. 3. Identify food quantities necessary to achieve wt loss of  -2# per week to a goal wt loss of 2.7-10.9 kg (6-24 lb) at graduation from pulmonary rehab.  Plan:  Pt to attend Pulmonary Nutrition class Will provide client-centered nutrition education as part of interdisciplinary care.    Monitor and Evaluate progress toward nutrition goal with team.   Laurina Bustle, MS, RD, LDN 07/31/2018 2:40 PM

## 2018-08-01 ENCOUNTER — Encounter (HOSPITAL_COMMUNITY)
Admission: RE | Admit: 2018-08-01 | Discharge: 2018-08-01 | Disposition: A | Discharge status: Home / Self Care | Payer: 59 | Source: Ambulatory Visit | Attending: Pulmonary Disease | Admitting: Pulmonary Disease

## 2018-08-01 ENCOUNTER — Other Ambulatory Visit: Payer: Self-pay | Admitting: Rheumatology

## 2018-08-01 DIAGNOSIS — J9611 Chronic respiratory failure with hypoxia: Secondary | ICD-10-CM

## 2018-08-01 NOTE — Telephone Encounter (Signed)
Last visit:06/05/18 Next visit:12/06/18  Okay to refill per Dr. Deveshwar 

## 2018-08-02 ENCOUNTER — Encounter (HOSPITAL_COMMUNITY): Payer: Self-pay | Admitting: *Deleted

## 2018-08-02 ENCOUNTER — Other Ambulatory Visit: Payer: Self-pay | Admitting: Rheumatology

## 2018-08-02 NOTE — Telephone Encounter (Signed)
Last visit:06/05/18 Next visit:12/06/18  Okay to refill Skelaxin?

## 2018-08-02 NOTE — Telephone Encounter (Signed)
ok 

## 2018-08-03 ENCOUNTER — Telehealth: Payer: Self-pay | Admitting: Pulmonary Disease

## 2018-08-03 DIAGNOSIS — J9611 Chronic respiratory failure with hypoxia: Secondary | ICD-10-CM

## 2018-08-03 DIAGNOSIS — H401112 Primary open-angle glaucoma, right eye, moderate stage: Secondary | ICD-10-CM | POA: Diagnosis not present

## 2018-08-03 NOTE — Telephone Encounter (Signed)
-----   Message from [place] sent at 08/03/2018  7:38 AM EDT ----- Regarding: Pulmonary Rehab Dr. Elsworth Soho,  Your patient, Teresa Roberts, completed her 6MWT with [place] on 08/01/18. Using a forehead probe, she desaturated to 82% on 8 liters--patient titrated to 10 liters which maintained her at 85% until the end of the walk test. We will further evaluate on her first day of rehab--seated exercise versus walking. Patient will need an oxymizer pendant. Do you mind placing the order?  Thank you! Hope all is well. Molly diVincenzo

## 2018-08-03 NOTE — Progress Notes (Signed)
Pulmonary Individual Treatment Plan  Patient Details  Name: Teresa Roberts MRN: [id number] Date of Birth: Aug 04, 1961 Referring Provider:     Pulmonary Rehab Walk Test from 08/01/2018 in [place]  Referring Provider  Dr. Elsworth Soho      Initial Encounter Date:    Pulmonary Rehab Walk Test from 08/01/2018 in [place]  Date  08/03/18      Visit Diagnosis: Chronic respiratory failure with hypoxia ([place])  Patient's Home Medications on Admission:   Current Outpatient Medications:  .  albuterol (PROVENTIL) (2.5 MG/3ML) 0.083% nebulizer solution, Take 3 mLs (2.5 mg total) by nebulization every 6 (six) hours as needed for wheezing or shortness of breath., Disp: 75 mL, Rfl: 12 .  amLODipine (NORVASC) 2.5 MG tablet, Take 2.5 mg by mouth daily., Disp: , Rfl: 0 .  aspirin 81 MG EC tablet, Take 81 mg by mouth daily after lunch., Disp: , Rfl: 0 .  busPIRone (BUSPAR) 15 MG tablet, Take 15 mg by mouth 2 (two) times daily., Disp: , Rfl:  .  docusate sodium (COLACE) 100 MG capsule, Take 100 mg by mouth 3 (three) times daily., Disp: , Rfl:  .  Fluticasone-Umeclidin-Vilant (TRELEGY ELLIPTA) 100-62.5-25 MCG/INH AEPB, Inhale 1 puff into the lungs daily., Disp: 28 each, Rfl: 3 .  folic acid (FOLVITE) 1 MG tablet, Take 1 mg by mouth daily after lunch., Disp: , Rfl: 0 .  furosemide (LASIX) 40 MG tablet, Take 1 tablet (40 mg total) by mouth daily., Disp: 30 tablet, Rfl: 11 .  gabapentin (NEURONTIN) 800 MG tablet, Take 800 mg by mouth 2 (two) times daily., Disp: , Rfl:  .  ipratropium-albuterol (DUONEB) 0.5-2.5 (3) MG/3ML SOLN, Take 3 mLs by nebulization every 6 (six) hours as needed (Shortness of breath)., Disp: 360 mL, Rfl: 1 .  latanoprost (XALATAN) 0.005 % ophthalmic solution, Place 1 drop into both eyes at bedtime., Disp: , Rfl:  .  metaxalone (SKELAXIN) 800 MG tablet, TAKE 1 TABLET (800 MG TOTAL) BY MOUTH 3 (THREE) TIMES DAILY AS NEEDED FOR MUSCLE  SPASMS., Disp: 90 tablet, Rfl: 0 .  modafinil (PROVIGIL) 100 MG tablet, Take 100 mg by mouth daily., Disp: , Rfl: 4 .  Multiple Vitamins-Minerals (HAIR/SKIN/NAILS/BIOTIN) TABS, Take 1 tablet by mouth daily after lunch., Disp: , Rfl:  .  omeprazole (PRILOSEC) 20 MG capsule, Take by mouth daily., Disp: , Rfl: 4 .  OXYGEN, Inhale 4 L into the lungs continuous., Disp: , Rfl:  .  potassium chloride 20 MEQ TBCR, Take 20 mEq by mouth daily., Disp: 30 tablet, Rfl: 1 .  rizatriptan (MAXALT-MLT) 10 MG disintegrating tablet, Take 10 mg by mouth as needed for migraine. May repeat in 2 hours if needed, Disp: , Rfl:  .  simvastatin (ZOCOR) 10 MG tablet, Take 10 mg by mouth at bedtime., Disp: , Rfl:  .  tiZANidine (ZANAFLEX) 4 MG tablet, TAKE 1 TABLET (4 MG TOTAL) BY MOUTH AT BEDTIME AS NEEDED FOR MUSCLE SPASMS., Disp: 30 tablet, Rfl: 0 .  topiramate (TOPAMAX) 25 MG tablet, Take 50 mg by mouth daily. , Disp: , Rfl:  .  traZODone (DESYREL) 100 MG tablet, Take 100 mg by mouth at bedtime. , Disp: , Rfl:  .  vitamin B-12 (CYANOCOBALAMIN) 1000 MCG tablet, Take 1,000 mcg by mouth daily after lunch., Disp: , Rfl:   Past Medical History: Past Medical History:  Diagnosis Date  . CAD (coronary artery disease)    a. 01/2017: cath showing 20%  mid RCA stenosis and otherwise normal LAD and LCx with a preserved EF of 55 to 60%; mod pulmonary HTN  . COPD (chronic obstructive pulmonary disease) ([place])    O2 dependent  . cpap   . Depression    PTSD  . Diastolic CHF ([place])   . Fibromyalgia   . Hyperlipidemia   . Hypertension   . Migraines     Tobacco Use: Social History   Tobacco Use  Smoking Status Former Smoker  . Packs/day: 1.00  . Years: 39.00  . Pack years: 39.00  . Types: Cigarettes  . Start date: 07/21/2018  Smokeless Tobacco Never Used  Tobacco Comment   resumed smoking less than a pack a day    Labs: Recent Review Flowsheet Data    Labs for ITP Cardiac and Pulmonary Rehab Latest Ref Rng & Units  08/09/2017 05/28/2018 07/19/2018 07/19/2018 07/19/2018   Cholestrol 0 - 200 mg/dL - 144 - - -   LDLCALC 0 - 99 mg/dL - 84 - - -   HDL >40 mg/dL - 31(L) - - -   Trlycerides <150 mg/dL - 147 - - -   Hemoglobin A1c 4.8 - 5.6 % - 6.3(H) - - -   PHART 7.350 - 7.450 - - - - -   PCO2ART 32.0 - 48.0 mmHg - - - - -   HCO3 20.0 - 28.0 mmol/L - - 28.4(H) 29.6(H) 28.7(H)   TCO2 22 - 32 mmol/L - - 30 31 30    ACIDBASEDEF 0.0 - 2.0 mmol/L - - - - -   O2SAT % 98.9 - 64.0 64.0 72.0      Capillary Blood Glucose: Lab Results  Component Value Date   GLUCAP 106 (H) 08/09/2017     Pulmonary Assessment Scores: Pulmonary Assessment Scores    Row Name 08/02/18 1515 08/03/18 0748       ADL UCSD   ADL Phase  Entry  Entry    SOB Score total  105  -      CAT Score   CAT Score  30  -      mMRC Score   mMRC Score  -  2       Pulmonary Function Assessment:   Exercise Target Goals: Exercise Program Goal: Individual exercise prescription set using results from initial 6 min walk test and THRR while considering  patient's activity barriers and safety.   Exercise Prescription Goal: Initial exercise prescription builds to 30-45 minutes a day of aerobic activity, 2-3 days per week.  Home exercise guidelines will be given to patient during program as part of exercise prescription that the participant will acknowledge.  Activity Barriers & Risk Stratification: Activity Barriers & Cardiac Risk Stratification - 07/28/18 1030      Activity Barriers & Cardiac Risk Stratification   Activity Barriers  Fibromyalgia;Balance Concerns;History of Falls;Joint Problems;Deconditioning;Shortness of Breath    Cardiac Risk Stratification  Moderate       6 Minute Walk: 6 Minute Walk    Row Name 08/03/18 0744         6 Minute Walk   Phase  Initial     Distance  800 feet     Walk Time  - 5 minutes     # of Rest Breaks  1 stopped by Ep due to desats     MPH  1.5     METS  2.15     RPE  12     Perceived Dyspnea    1  Comments  used wheelchair/slow recovery/forhead probe/requested oxymizer     Resting HR  89 bpm     Resting BP  128/80     Resting Oxygen Saturation   97 %     Exercise Oxygen Saturation  during 6 min walk  81 %     Max Ex. HR  110 bpm     Max Ex. BP  140/80       Interval HR   1 Minute HR  - would not read on pulse ox     2 Minute HR  - placed forhead probe     3 Minute HR  106     4 Minute HR  106     5 Minute HR  110     6 Minute HR  110     2 Minute Post HR  98     Interval Heart Rate?  Yes       Interval Oxygen   Interval Oxygen?  Yes     Baseline Oxygen Saturation %  97 %     1 Minute Liters of Oxygen  4 L     2 Minute Liters of Oxygen  4 L     3 Minute Oxygen Saturation %  81 %     3 Minute Liters of Oxygen  4 L     4 Minute Oxygen Saturation %  82 %     4 Minute Liters of Oxygen  8 L     5 Minute Oxygen Saturation %  87 %     5 Minute Liters of Oxygen  10 L     6 Minute Oxygen Saturation %  85 %     6 Minute Liters of Oxygen  10 L     2 Minute Post Oxygen Saturation %  88 %     2 Minute Post Liters of Oxygen  10 L        Oxygen Initial Assessment: Oxygen Initial Assessment - 08/03/18 0747      Initial 6 min Walk   Oxygen Used  Continuous;E-Tanks    Liters per minute  10   maintained oxygen at 85%-will re-evaluate on first day of rehab     Program Oxygen Prescription   Program Oxygen Prescription  Continuous;E-Tanks    Liters per minute  10    Comments  Will re-evaluate patient's needs since 10 liters only maintained her at 85% with walking. Requested oxymizer       Intervention   Short Term Goals  To learn and exhibit compliance with exercise, home and travel O2 prescription;To learn and understand importance of maintaining oxygen saturations>88%;To learn and demonstrate proper use of respiratory medications;To learn and understand importance of monitoring SPO2 with pulse oximeter and demonstrate accurate use of the pulse oximeter.;To learn and  demonstrate proper pursed lip breathing techniques or other breathing techniques.    Long  Term Goals  Exhibits compliance with exercise, home and travel O2 prescription;Verbalizes importance of monitoring SPO2 with pulse oximeter and return demonstration;Maintenance of O2 saturations>88%;Exhibits proper breathing techniques, such as pursed lip breathing or other method taught during program session;Compliance with respiratory medication;Demonstrates proper use of MDI's       Oxygen Re-Evaluation:   Oxygen Discharge (Final Oxygen Re-Evaluation):   Initial Exercise Prescription: Initial Exercise Prescription - 08/03/18 0700      Date of Initial Exercise RX and Referring Provider   Date  08/03/18    Referring Provider  Dr. Elsworth Soho  Oxygen   Oxygen  Continuous    Liters  10      Recumbant Bike   Level  2    Watts  10    Minutes  17      NuStep   Level  2    SPM  80    Minutes  17    METs  1.5      Track   Laps  10    Minutes  17      Prescription Details   Frequency (times per week)  2    Duration  Progress to 45 minutes of aerobic exercise without signs/symptoms of physical distress      Intensity   THRR 40-80% of Max Heartrate  66-132    Ratings of Perceived Exertion  11-13    Perceived Dyspnea  0-4      Progression   Progression  Continue to progress workloads to maintain intensity without signs/symptoms of physical distress.      Resistance Training   Training Prescription  Yes    Weight  blue bands    Reps  10-15       Perform Capillary Blood Glucose checks as needed.  Exercise Prescription Changes:   Exercise Comments:   Exercise Goals and Review: Exercise Goals    Row Name 07/28/18 1030             Exercise Goals   Increase Physical Activity  Yes       Intervention  Provide advice, education, support and counseling about physical activity/exercise needs.;Develop an individualized exercise prescription for aerobic and resistive training  based on initial evaluation findings, risk stratification, comorbidities and participant's personal goals.       Expected Outcomes  Short Term: Attend rehab on a regular basis to increase amount of physical activity.;Long Term: Add in home exercise to make exercise part of routine and to increase amount of physical activity.;Long Term: Exercising regularly at least 3-5 days a week.       Increase Strength and Stamina  Yes       Intervention  Provide advice, education, support and counseling about physical activity/exercise needs.;Develop an individualized exercise prescription for aerobic and resistive training based on initial evaluation findings, risk stratification, comorbidities and participant's personal goals.       Expected Outcomes  Short Term: Increase workloads from initial exercise prescription for resistance, speed, and METs.;Short Term: Perform resistance training exercises routinely during rehab and add in resistance training at home;Long Term: Improve cardiorespiratory fitness, muscular endurance and strength as measured by increased METs and functional capacity (6MWT)       Able to understand and use rate of perceived exertion (RPE) scale  Yes       Intervention  Provide education and explanation on how to use RPE scale       Expected Outcomes  Short Term: Able to use RPE daily in rehab to express subjective intensity level;Long Term:  Able to use RPE to guide intensity level when exercising independently       Able to understand and use Dyspnea scale  Yes       Intervention  Provide education and explanation on how to use Dyspnea scale       Expected Outcomes  Short Term: Able to use Dyspnea scale daily in rehab to express subjective sense of shortness of breath during exertion;Long Term: Able to use Dyspnea scale to guide intensity level when exercising independently       Knowledge  and understanding of Target Heart Rate Range (THRR)  Yes       Intervention  Provide education and  explanation of THRR including how the numbers were predicted and where they are located for reference       Expected Outcomes  Short Term: Able to state/look up THRR;Long Term: Able to use THRR to govern intensity when exercising independently;Short Term: Able to use daily as guideline for intensity in rehab       Understanding of Exercise Prescription  Yes       Intervention  Provide education, explanation, and written materials on patient's individual exercise prescription       Expected Outcomes  Short Term: Able to explain program exercise prescription;Long Term: Able to explain home exercise prescription to exercise independently          Exercise Goals Re-Evaluation :   Discharge Exercise Prescription (Final Exercise Prescription Changes):   Nutrition:  Target Goals: Understanding of nutrition guidelines, daily intake of sodium 1500mg , cholesterol 200mg , calories 30% from fat and 7% or less from saturated fats, daily to have 5 or more servings of fruits and vegetables.  Biometrics:    Nutrition Therapy Plan and Nutrition Goals: Nutrition Therapy & Goals - 07/31/18 1442      Nutrition Therapy   Diet  heart healthy      Personal Nutrition Goals   Nutrition Goal  Pt to identify and limit food sources of sodium,saturated fats, trans fats and refined carbohydrates    Personal Goal #2  Identify food quantities necessary to achieve wt loss of  -2# per week to a goal wt loss of 2.7-10.9 kg (6-24 lb) at graduation from pulmonary rehab.      Intervention Plan   Intervention  Prescribe, educate and counsel regarding individualized specific dietary modifications aiming towards targeted core components such as weight, hypertension, lipid management, diabetes, heart failure and other comorbidities.    Expected Outcomes  Short Term Goal: Understand basic principles of dietary content, such as calories, fat, sodium, cholesterol and nutrients.;Long Term Goal: Adherence to prescribed  nutrition plan.       Nutrition Assessments: Nutrition Assessments - 07/31/18 1442      Rate Your Plate Scores   Pre Score  42      MEDFICTS Scores   Pre Score  --       Nutrition Goals Re-Evaluation:   Nutrition Goals Discharge (Final Nutrition Goals Re-Evaluation):   Psychosocial: Target Goals: Acknowledge presence or absence of significant depression and/or stress, maximize coping skills, provide positive support system. Participant is able to verbalize types and ability to use techniques and skills needed for reducing stress and depression.  Initial Review & Psychosocial Screening: Initial Psych Review & Screening - 07/28/18 1038      Initial Review   Current issues with  Current Depression;History of Depression;Current Stress Concerns    Source of Stress Concerns  Chronic Illness;Unable to perform yard/household activities;Unable to participate in former interests or hobbies    Comments  Pt with history and current history of depression.  Pt would like to seek counsel with staff in the [place]?  Yes    Comments  husband       Barriers   Psychosocial barriers to participate in program  The patient should benefit from training in stress management and relaxation.      Screening Interventions   Interventions  Encouraged to exercise;To provide support  and resources with identified psychosocial needs    Expected Outcomes  Short Term goal: Identification and review with participant of any Quality of Life or Depression concerns found by scoring the questionnaire.;Long Term goal: The participant improves quality of Life and PHQ9 Scores as seen by post scores and/or verbalization of changes;Short Term goal: Utilizing psychosocial counselor, staff and physician to assist with identification of specific Stressors or current issues interfering with healing process. Setting desired goal for each stressor or current issue  identified.;Long Term Goal: Stressors or current issues are controlled or eliminated.       Quality of Life Scores:  Scores of 19 and below usually indicate a poorer quality of life in these areas.  A difference of  2-3 points is a clinically meaningful difference.  A difference of 2-3 points in the total score of the Quality of Life Index has been associated with significant improvement in overall quality of life, self-image, physical symptoms, and general health in studies assessing change in quality of life.  PHQ-9: Recent Review Flowsheet Data    Depression screen [facility] 2/9 07/28/2018   Decreased Interest 3   Down, Depressed, Hopeless 2   PHQ - 2 Score 5   Altered sleeping 0   Tired, decreased energy 3   Change in appetite 3   Feeling bad or failure about yourself  2   Trouble concentrating 2   Moving slowly or fidgety/restless 3   Suicidal thoughts 0   PHQ-9 Score 18   Difficult doing work/chores Very difficult     Interpretation of Total Score  Total Score Depression Severity:  1-4 = Minimal depression, 5-9 = Mild depression, 10-14 = Moderate depression, 15-19 = Moderately severe depression, 20-27 = Severe depression   Psychosocial Evaluation and Intervention:   Psychosocial Re-Evaluation:   Psychosocial Discharge (Final Psychosocial Re-Evaluation):   Education: Education Goals: Education classes will be provided on a weekly basis, covering required topics. Participant will state understanding/return demonstration of topics presented.  Learning Barriers/Preferences: Learning Barriers/Preferences - 07/28/18 1040      Learning Barriers/Preferences   Learning Barriers  Sight    Learning Preferences  Computer/Internet;Group Instruction;Individual Instruction;Skilled Demonstration;Verbal Instruction;Written Material       Education Topics: Risk Factor Reduction:  -Group instruction that is supported by a PowerPoint presentation. Instructor discusses the definition of  a risk factor, different risk factors for pulmonary disease, and how the heart and lungs work together.     Nutrition for Pulmonary Patient:  -Group instruction provided by PowerPoint slides, verbal discussion, and written materials to support subject matter. The instructor gives an explanation and review of healthy diet recommendations, which includes a discussion on weight management, recommendations for fruit and vegetable consumption, as well as protein, fluid, caffeine, fiber, sodium, sugar, and alcohol. Tips for eating when patients are short of breath are discussed.   Pursed Lip Breathing:  -Group instruction that is supported by demonstration and informational handouts. Instructor discusses the benefits of pursed lip and diaphragmatic breathing and detailed demonstration on how to preform both.     Oxygen Safety:  -Group instruction provided by PowerPoint, verbal discussion, and written material to support subject matter. There is an overview of "What is Oxygen" and "Why do we need it".  Instructor also reviews how to create a safe environment for oxygen use, the importance of using oxygen as prescribed, and the risks of noncompliance. There is a brief discussion on traveling with oxygen and resources the patient may utilize.   Oxygen Equipment:  -  Group instruction provided by [facility] Staff utilizing handouts, written materials, and equipment demonstrations.   Signs and Symptoms:  -Group instruction provided by written material and verbal discussion to support subject matter. Warning signs and symptoms of infection, stroke, and heart attack are reviewed and when to call the physician/911 reinforced. Tips for preventing the spread of infection discussed.   Advanced Directives:  -Group instruction provided by verbal instruction and written material to support subject matter. Instructor reviews Advanced Directive laws and proper instruction for filling out document.   Pulmonary  Video:  -Group video education that reviews the importance of medication and oxygen compliance, exercise, good nutrition, pulmonary hygiene, and pursed lip and diaphragmatic breathing for the pulmonary patient.   Exercise for the Pulmonary Patient:  -Group instruction that is supported by a PowerPoint presentation. Instructor discusses benefits of exercise, core components of exercise, frequency, duration, and intensity of an exercise routine, importance of utilizing pulse oximetry during exercise, safety while exercising, and options of places to exercise outside of rehab.     Pulmonary Medications:  -Verbally interactive group education provided by instructor with focus on inhaled medications and proper administration.   Anatomy and Physiology of the Respiratory System and Intimacy:  -Group instruction provided by PowerPoint, verbal discussion, and written material to support subject matter. Instructor reviews respiratory cycle and anatomical components of the respiratory system and their functions. Instructor also reviews differences in obstructive and restrictive respiratory diseases with examples of each. Intimacy, Sex, and Sexuality differences are reviewed with a discussion on how relationships can change when diagnosed with pulmonary disease. Common sexual concerns are reviewed.   MD DAY -A group question and answer session with a medical doctor that allows participants to ask questions that relate to their pulmonary disease state.   OTHER EDUCATION -Group or individual verbal, written, or video instructions that support the educational goals of the pulmonary rehab program.   Holiday Eating Survival Tips:  -Group instruction provided by PowerPoint slides, verbal discussion, and written materials to support subject matter. The instructor gives patients tips, tricks, and techniques to help them not only survive but enjoy the holidays despite the onslaught of food that accompanies the  holidays.   Knowledge Questionnaire Score: Knowledge Questionnaire Score - 08/02/18 1515      Knowledge Questionnaire Score   Pre Score  14/18       Core Components/Risk Factors/Patient Goals at Admission: Personal Goals and Risk Factors at Admission - 07/28/18 1041      Core Components/Risk Factors/Patient Goals on Admission    Weight Management  Yes;Weight Loss    Admit Weight  210 lb 5.1 oz (95.4 kg)    Goal Weight: Short Term  195 lb (88.5 kg)    Goal Weight: Long Term  175 lb (79.4 kg)    Expected Outcomes  Short Term: Continue to assess and modify interventions until short term weight is achieved;Long Term: Adherence to nutrition and physical activity/exercise program aimed toward attainment of established weight goal;Weight Loss: Understanding of general recommendations for a balanced deficit meal plan, which promotes 1-2 lb weight loss per week and includes a negative energy balance of [phone] kcal/d;Understanding recommendations for meals to include 15-35% energy as protein, 25-35% energy from fat, 35-60% energy from carbohydrates, less than 200mg  of dietary cholesterol, 20-35 gm of total fiber daily;Understanding of distribution of calorie intake throughout the day with the consumption of 4-5 meals/snacks    Improve shortness of breath with ADL's  Yes    Intervention  Provide education, individualized exercise plan and daily activity instruction to help decrease symptoms of SOB with activities of daily living.    Expected Outcomes  Short Term: Improve cardiorespiratory fitness to achieve a reduction of symptoms when performing ADLs;Long Term: Be able to perform more ADLs without symptoms or delay the onset of symptoms    Lipids  Yes    Intervention  Provide education and support for participant on nutrition & aerobic/resistive exercise along with prescribed medications to achieve LDL 70mg , HDL >40mg .    Expected Outcomes  Short Term: Participant states understanding of desired  cholesterol values and is compliant with medications prescribed. Participant is following exercise prescription and nutrition guidelines.;Long Term: Cholesterol controlled with medications as prescribed, with individualized exercise RX and with personalized nutrition plan. Value goals: LDL < 70mg , HDL > 40 mg.    Stress  Yes    Intervention  Offer individual and/or small group education and counseling on adjustment to heart disease, stress management and health-related lifestyle change. Teach and support self-help strategies.;Refer participants experiencing significant psychosocial distress to appropriate mental health specialists for further evaluation and treatment. When possible, include family members and significant others in education/counseling sessions.    Expected Outcomes  Short Term: Participant demonstrates changes in health-related behavior, relaxation and other stress management skills, ability to obtain effective social support, and compliance with psychotropic medications if prescribed.;Long Term: Emotional wellbeing is indicated by absence of clinically significant psychosocial distress or social isolation.    Personal Goal Other  Yes    Personal Goal  Pt would like become physically conditioned that she no longer requires as much or no oxygen    Intervention  Proper breathing techniques such as deep breathing and purse lip breathing    Expected Outcomes  Pt will report not needing as much oxygen with acceptable range for oxygen saturation and no shortness of breath       Core Components/Risk Factors/Patient Goals Review:    Core Components/Risk Factors/Patient Goals at Discharge (Final Review):    ITP Comments: ITP Comments    Row Name 07/28/18 1029           ITP Comments  Dr. Manfred Arch, Medical Director Pulmonary          Comments:

## 2018-08-03 NOTE — Telephone Encounter (Signed)
OK to order 

## 2018-08-08 ENCOUNTER — Encounter (HOSPITAL_COMMUNITY)
Admission: RE | Admit: 2018-08-08 | Discharge: 2018-08-08 | Disposition: A | Discharge status: Home / Self Care | Payer: 59 | Source: Ambulatory Visit | Attending: Pulmonary Disease | Admitting: Pulmonary Disease

## 2018-08-08 DIAGNOSIS — J9611 Chronic respiratory failure with hypoxia: Secondary | ICD-10-CM | POA: Diagnosis not present

## 2018-08-08 NOTE — Progress Notes (Signed)
Daily Session Note  Patient Details  Name: Teresa Roberts MRN: [id number] Date of Birth: 10-May-1961 Referring Provider:     Pulmonary Rehab Walk Test from 08/01/2018 in [place]  Referring Provider  Dr. Elsworth Soho      Encounter Date: 08/08/2018  Check In: Session Check In - 08/08/18 1513      Check-In   Supervising physician immediately available to respond to emergencies  [facility] immediately available    Physician(s)  Dr.Danford     Location  [facility]    Staff Present  Maurice Small, RN, BSN;Staton Markey, MS, ACSM RCEP, Exercise Physiologist;Annedrea Stackhouse, RN, MHA;Maria Whitaker, RN, BSN    Medication changes reported      No    Fall or balance concerns reported     No    Tobacco Cessation  No Change    Warm-up and Cool-down  Performed as group-led Higher education careers adviser Performed  Yes    VAD Patient?  No    PAD/SET Patient?  No      Pain Assessment   Currently in Pain?  No/denies       Capillary Blood Glucose: No results found for this or any previous visit (from the past 24 hour(s)).    Social History   Tobacco Use  Smoking Status Former Smoker  . Packs/day: 1.00  . Years: 39.00  . Pack years: 39.00  . Types: Cigarettes  . Start date: 07/21/2018  Smokeless Tobacco Never Used  Tobacco Comment   resumed smoking less than a pack a day    Goals Met:  Exercise tolerated well  Goals Unmet:  Not Applicable  Comments: Service time is from 1:30p to 3:15p    Dr. Rush Farmer is Medical Director for Pulmonary Rehab at [facility].

## 2018-08-10 ENCOUNTER — Encounter (HOSPITAL_COMMUNITY)
Admission: RE | Admit: 2018-08-10 | Discharge: 2018-08-10 | Disposition: A | Discharge status: Home / Self Care | Payer: 59 | Source: Ambulatory Visit | Attending: Pulmonary Disease | Admitting: Pulmonary Disease

## 2018-08-10 VITALS — Wt 210.1 lb

## 2018-08-10 DIAGNOSIS — J9611 Chronic respiratory failure with hypoxia: Secondary | ICD-10-CM | POA: Diagnosis not present

## 2018-08-10 NOTE — Progress Notes (Signed)
Daily Session Note  Patient Details  Name: Teresa Roberts MRN: [id number] Date of Birth: 27-Jul-1961 Referring Provider:     Pulmonary Rehab Walk Test from 08/01/2018 in [place]  Referring Provider  Dr. Elsworth Soho      Encounter Date: 08/10/2018  Check In: Session Check In - 08/10/18 1411      Check-In   Supervising physician immediately available to respond to emergencies  [facility] immediately available    Physician(s)  Dr.Danford     Location  [facility]    Staff Present  Maurice Small, RN, BSN;Abigial Newville, MS, ACSM RCEP, Exercise Physiologist;Annedrea Stackhouse, RN, MHA    Medication changes reported      No    Fall or balance concerns reported     No    Tobacco Cessation  No Change    Warm-up and Cool-down  Performed as group-led Higher education careers adviser Performed  Yes    VAD Patient?  No    PAD/SET Patient?  No      Pain Assessment   Currently in Pain?  No/denies    Multiple Pain Sites  No       Capillary Blood Glucose: No results found for this or any previous visit (from the past 24 hour(s)).    Social History   Tobacco Use  Smoking Status Former Smoker  . Packs/day: 1.00  . Years: 39.00  . Pack years: 39.00  . Types: Cigarettes  . Start date: 07/21/2018  Smokeless Tobacco Never Used  Tobacco Comment   resumed smoking less than a pack a day    Goals Met:  Exercise tolerated well  Goals Unmet:  Not Applicable  Comments: Service time is from 1:30p to 3:30p    Dr. Rush Farmer is Medical Director for Pulmonary Rehab at [facility].

## 2018-08-15 ENCOUNTER — Encounter (HOSPITAL_COMMUNITY): Payer: 59

## 2018-08-17 ENCOUNTER — Encounter (HOSPITAL_COMMUNITY)
Admission: RE | Admit: 2018-08-17 | Discharge: 2018-08-17 | Disposition: A | Discharge status: Home / Self Care | Payer: 59 | Source: Ambulatory Visit | Attending: Pulmonary Disease | Admitting: Pulmonary Disease

## 2018-08-17 DIAGNOSIS — Z79899 Other long term (current) drug therapy: Secondary | ICD-10-CM | POA: Diagnosis not present

## 2018-08-17 DIAGNOSIS — I110 Hypertensive heart disease with heart failure: Secondary | ICD-10-CM | POA: Insufficient documentation

## 2018-08-17 DIAGNOSIS — Z7982 Long term (current) use of aspirin: Secondary | ICD-10-CM | POA: Insufficient documentation

## 2018-08-17 DIAGNOSIS — E785 Hyperlipidemia, unspecified: Secondary | ICD-10-CM | POA: Diagnosis not present

## 2018-08-17 DIAGNOSIS — G43909 Migraine, unspecified, not intractable, without status migrainosus: Secondary | ICD-10-CM | POA: Insufficient documentation

## 2018-08-17 DIAGNOSIS — F4310 Post-traumatic stress disorder, unspecified: Secondary | ICD-10-CM | POA: Diagnosis not present

## 2018-08-17 DIAGNOSIS — F329 Major depressive disorder, single episode, unspecified: Secondary | ICD-10-CM | POA: Insufficient documentation

## 2018-08-17 DIAGNOSIS — E669 Obesity, unspecified: Secondary | ICD-10-CM | POA: Insufficient documentation

## 2018-08-17 DIAGNOSIS — J9611 Chronic respiratory failure with hypoxia: Secondary | ICD-10-CM | POA: Diagnosis not present

## 2018-08-17 DIAGNOSIS — I2510 Atherosclerotic heart disease of native coronary artery without angina pectoris: Secondary | ICD-10-CM | POA: Insufficient documentation

## 2018-08-17 DIAGNOSIS — J449 Chronic obstructive pulmonary disease, unspecified: Secondary | ICD-10-CM | POA: Diagnosis not present

## 2018-08-17 DIAGNOSIS — F17200 Nicotine dependence, unspecified, uncomplicated: Secondary | ICD-10-CM | POA: Diagnosis not present

## 2018-08-17 DIAGNOSIS — Z9981 Dependence on supplemental oxygen: Secondary | ICD-10-CM | POA: Diagnosis not present

## 2018-08-17 DIAGNOSIS — I5032 Chronic diastolic (congestive) heart failure: Secondary | ICD-10-CM | POA: Insufficient documentation

## 2018-08-17 DIAGNOSIS — Z6834 Body mass index (BMI) 34.0-34.9, adult: Secondary | ICD-10-CM | POA: Diagnosis not present

## 2018-08-17 NOTE — Progress Notes (Signed)
Daily Session Note  Patient Details  Name: Carlei Huang MRN: [id number] Date of Birth: 1961-09-22 Referring Provider:     Pulmonary Rehab Walk Test from 08/01/2018 in [place]  Referring Provider  Dr. Elsworth Soho      Encounter Date: 08/17/2018  Check In: Session Check In - 08/17/18 1346      Check-In   Supervising physician immediately available to respond to emergencies  [facility] immediately available    Physician(s)  Dr. Horris Latino    Location  [facility]    Staff Present  Maurice Small, RN, BSN;Molly DiVincenzo, MS, ACSM RCEP, Exercise Physiologist;Jayleene Ysidro Evert, Felipe Drone, RN, MHA    Medication changes reported      No    Fall or balance concerns reported     No    Tobacco Cessation  No Change    Warm-up and Cool-down  Performed as group-led instruction    Resistance Training Performed  Yes    VAD Patient?  No    PAD/SET Patient?  No      Pain Assessment   Currently in Pain?  No/denies    Pain Score  0-No pain    Multiple Pain Sites  No       Capillary Blood Glucose: No results found for this or any previous visit (from the past 24 hour(s)).    Social History   Tobacco Use  Smoking Status Former Smoker  . Packs/day: 1.00  . Years: 39.00  . Pack years: 39.00  . Types: Cigarettes  . Start date: 07/21/2018  Smokeless Tobacco Never Used  Tobacco Comment   resumed smoking less than a pack a day    Goals Met:  Exercise tolerated well No report of cardiac concerns or symptoms Strength training completed today  Goals Unmet:  Not Applicable  Comments: Service time is from 1330 to 1530    Dr. Rush Farmer is Medical Director for Pulmonary Rehab at [facility].

## 2018-08-22 ENCOUNTER — Encounter (HOSPITAL_COMMUNITY)
Admission: RE | Admit: 2018-08-22 | Discharge: 2018-08-22 | Disposition: A | Discharge status: Home / Self Care | Payer: 59 | Source: Ambulatory Visit | Attending: Pulmonary Disease | Admitting: Pulmonary Disease

## 2018-08-22 DIAGNOSIS — J9611 Chronic respiratory failure with hypoxia: Secondary | ICD-10-CM | POA: Diagnosis not present

## 2018-08-22 NOTE — Progress Notes (Signed)
Daily Session Note  Patient Details  Name: Teresa Roberts MRN: [id number] Date of Birth: Feb 03, 1961 Referring Provider:     Pulmonary Rehab Walk Test from 08/01/2018 in [place]  Referring Provider  Dr. Elsworth Soho      Encounter Date: 08/22/2018  Check In: Session Check In - 08/22/18 1522      Check-In   Supervising physician immediately available to respond to emergencies  [facility] immediately available    Physician(s)  Dr. Horris Latino    Location  [facility]    Staff Present  Su Hilt, MS, ACSM RCEP, Exercise Physiologist;Carlen Colletta [place], RN, MHA    Medication changes reported      No    Fall or balance concerns reported     No    Tobacco Cessation  No Change    Warm-up and Cool-down  Performed as group-led instruction    Resistance Training Performed  Yes    VAD Patient?  No    PAD/SET Patient?  No      Pain Assessment   Currently in Pain?  No/denies    Pain Score  0-No pain    Multiple Pain Sites  No       Capillary Blood Glucose: No results found for this or any previous visit (from the past 24 hour(s)).    Social History   Tobacco Use  Smoking Status Former Smoker  . Packs/day: 1.00  . Years: 39.00  . Pack years: 39.00  . Types: Cigarettes  . Start date: 07/21/2018  Smokeless Tobacco Never Used  Tobacco Comment   resumed smoking less than a pack a day    Goals Met:  Exercise tolerated well  Goals Unmet:  Not Applicable  Comments: Service time is from 1:30p to 3:00p    Dr. Rush Farmer is Medical Director for Pulmonary Rehab at [facility].

## 2018-08-24 ENCOUNTER — Encounter (HOSPITAL_COMMUNITY)
Admission: RE | Admit: 2018-08-24 | Discharge: 2018-08-24 | Disposition: A | Discharge status: Home / Self Care | Payer: 59 | Source: Ambulatory Visit | Attending: Pulmonary Disease | Admitting: Pulmonary Disease

## 2018-08-24 VITALS — Wt 213.6 lb

## 2018-08-24 DIAGNOSIS — J9611 Chronic respiratory failure with hypoxia: Secondary | ICD-10-CM | POA: Diagnosis not present

## 2018-08-24 NOTE — Progress Notes (Signed)
Daily Session Note  Patient Details  Name: Teresa Roberts MRN: [id number] Date of Birth: 01/13/1961 Referring Provider:     Pulmonary Rehab Walk Test from 08/01/2018 in [place]  Referring Provider  Dr. Elsworth Roberts      Encounter Date: 08/24/2018  Check In: Session Check In - 08/24/18 1358      Check-In   Supervising physician immediately available to respond to emergencies  [facility] immediately available    Physician(s)  Dr. Herbert Roberts    Location  [facility]    Staff Present  Teresa Hilt, MS, ACSM RCEP, Exercise Physiologist;Teresa Colletta [place], RN, MHA;Teresa Kris Mouton, MS, Exercise Physiologist    Medication changes reported      No    Fall or balance concerns reported     No    Tobacco Cessation  No Change    Warm-up and Cool-down  Performed as group-led instruction    Resistance Training Performed  Yes    VAD Patient?  No    PAD/SET Patient?  No      Pain Assessment   Currently in Pain?  No/denies    Multiple Pain Sites  No       Capillary Blood Glucose: No results found for this or any previous visit (from the past 24 hour(s)).    Social History   Tobacco Use  Smoking Status Former Smoker  . Packs/day: 1.00  . Years: 39.00  . Pack years: 39.00  . Types: Cigarettes  . Start date: 07/21/2018  Smokeless Tobacco Never Used  Tobacco Comment   resumed smoking less than a pack a day    Goals Met:  Exercise tolerated well No report of cardiac concerns or symptoms Strength training completed today  Goals Unmet:  Not Applicable  Comments: Service time is from 1330 to 1545    Dr. Rush Roberts is Medical Director for Pulmonary Rehab at [facility].

## 2018-08-29 ENCOUNTER — Encounter (HOSPITAL_COMMUNITY): Payer: Self-pay | Admitting: Emergency Medicine

## 2018-08-29 ENCOUNTER — Ambulatory Visit (INDEPENDENT_AMBULATORY_CARE_PROVIDER_SITE_OTHER): Payer: 59

## 2018-08-29 ENCOUNTER — Ambulatory Visit (HOSPITAL_COMMUNITY)
Admission: EM | Admit: 2018-08-29 | Discharge: 2018-08-29 | Disposition: A | Discharge status: Home / Self Care | Payer: 59 | Attending: Family Medicine | Admitting: Family Medicine

## 2018-08-29 ENCOUNTER — Other Ambulatory Visit: Payer: Self-pay

## 2018-08-29 ENCOUNTER — Encounter (HOSPITAL_COMMUNITY): Payer: 59

## 2018-08-29 DIAGNOSIS — S52125A Nondisplaced fracture of head of left radius, initial encounter for closed fracture: Secondary | ICD-10-CM

## 2018-08-29 DIAGNOSIS — W19XXXA Unspecified fall, initial encounter: Secondary | ICD-10-CM

## 2018-08-29 DIAGNOSIS — M25442 Effusion, left hand: Secondary | ICD-10-CM | POA: Diagnosis not present

## 2018-08-29 DIAGNOSIS — M25532 Pain in left wrist: Secondary | ICD-10-CM

## 2018-08-29 DIAGNOSIS — M25422 Effusion, left elbow: Secondary | ICD-10-CM | POA: Diagnosis not present

## 2018-08-29 NOTE — Discharge Instructions (Signed)
It appears that you have a fracture at your elbow Please follow-up with orthopedics for further management and treatment of this Mainly use Tylenol [phone] mg every 4-6 hours, may supplement with ibuprofen if Tylenol not helping pain  Please follow-up if developing worsening swelling, difficulty moving fingers or pain in the arm after applying the splint

## 2018-08-29 NOTE — ED Triage Notes (Signed)
Pt reports she was walking back to her house from her mailbox when she suddenly fell.  She hit her left knee and landed on her left arm.  She complains of pain from her wrist to her elbow and one sore spot on her knee.

## 2018-08-29 NOTE — Progress Notes (Signed)
Orthopedic Tech Progress Note Patient Details:  Teresa Roberts 1961/06/23 [id number]  Ortho Devices Type of Ortho Device: Arm sling, Post (long arm) splint, Ace wrap Ortho Device/Splint Interventions: Application   Post Interventions Patient Tolerated: Well Instructions Provided: Care of device   [name] 08/29/2018, 10:18 AM

## 2018-08-29 NOTE — ED Provider Notes (Signed)
[place]    CSN: [id number] Arrival date & time: 08/29/18  8891     History   Chief Complaint Chief Complaint  Patient presents with  . Fall    HPI Teresa Roberts is a 57 y.o. female history of CAD, diastolic CHF, hyperlipidemia, hypertension, COPD on oxygen presenting today for evaluation of left arm pain after a fall.  Patient was walking back from her mailbox and accidentally fell.  She denies loss of consciousness or hitting head.  She denies any prodrome of dizziness, lightheadedness prior to falling.  States that she landed on her left side.  Has had some mild knee soreness, but mainly pain in her left arm from her elbow to her wrist.  Has been limiting motion of this area due to pain.  Accident happened Saturday, approximately 3 to 4 days ago.  Denies changes in vision, headaches, nausea or vomiting since.  HPI  Past Medical History:  Diagnosis Date  . CAD (coronary artery disease)    a. 01/2017: cath showing 20% mid RCA stenosis and otherwise normal LAD and LCx with a preserved EF of 55 to 60%; mod pulmonary HTN  . COPD (chronic obstructive pulmonary disease) ([place])    O2 dependent  . cpap   . Depression    PTSD  . Diastolic CHF ([place])   . Fibromyalgia   . Hyperlipidemia   . Hypertension   . Migraines     Patient Active Problem List   Diagnosis Date Noted  . Chest pain 05/27/2018  . HLD (hyperlipidemia) 05/27/2018  . Essential hypertension 05/27/2018  . Depression 05/27/2018  . OSA (obstructive sleep apnea) 05/03/2018  . Pulmonary hypertension ([place]) 05/03/2018  . Bimalleolar fracture of left ankle 08/12/2017  . Acute renal failure (ARF) ([place]) 08/09/2017  . Acute on chronic diastolic CHF (congestive heart failure) ([place]) 08/09/2017  . Acute metabolic encephalopathy 69/45/0388  . Other fatigue 06/13/2017  . Primary insomnia 06/13/2017  . History of migraine 06/13/2017  . COPD (chronic obstructive pulmonary disease) ([place]) 02/15/2017  . Tobacco abuse  02/15/2017  . Respiratory failure with hypoxia ([place]) 01/28/2017  . Migraine 01/28/2017  . Daily headache   . Hypoxia   . Shortness of breath 01/26/2017  . Non-ST elevation myocardial infarction (NSTEMI), type 2 01/26/2017  . MGUS (monoclonal gammopathy of unknown significance) 01/11/2012  . Fibromyalgia 01/11/2012  . Bruises easily 01/11/2012    Past Surgical History:  Procedure Laterality Date  . APPENDECTOMY    . CESAREAN SECTION    . ORIF ANKLE FRACTURE Left 08/12/2017   Procedure: OPEN REDUCTION INTERNAL FIXATION (ORIF) ANKLE FRACTURE;  Surgeon: Rod Can, MD;  Location: [place];  Service: Orthopedics;  Laterality: Left;  . RIGHT HEART CATH N/A 07/19/2018   Procedure: RIGHT HEART CATH;  Surgeon: Jolaine Artist, MD;  Location: [place] [facility];  Service: Cardiovascular;  Laterality: N/A;  . RIGHT/LEFT HEART CATH AND CORONARY ANGIOGRAPHY N/A 01/27/2017   Procedure: Right/Left Heart Cath and Coronary Angiography;  Surgeon: Troy Sine, MD;  Location: [place] [facility];  Service: Cardiovascular;  Laterality: N/A;    OB History   None      Home Medications    Prior to Admission medications   Medication Sig Start Date End Date Taking? Authorizing Provider  albuterol (PROVENTIL) (2.5 MG/3ML) 0.083% nebulizer solution Take 3 mLs (2.5 mg total) by nebulization every 6 (six) hours as needed for wheezing or shortness of breath. 06/19/18  Yes Rigoberto Noel, MD  amLODipine (  NORVASC) 2.5 MG tablet Take 2.5 mg by mouth daily. 05/11/18  Yes [provider]  aspirin 81 MG EC tablet Take 81 mg by mouth daily after lunch. 05/11/18  Yes [provider]  busPIRone (BUSPAR) 15 MG tablet Take 15 mg by mouth 2 (two) times daily.   Yes [provider]  DULoxetine (CYMBALTA) 30 MG capsule Take 30 mg by mouth daily. 08/02/18  Yes [provider]  Fluticasone-Umeclidin-Vilant (TRELEGY ELLIPTA) 100-62.5-25 MCG/INH AEPB Inhale 1 puff into the lungs daily.  06/19/18  Yes Rigoberto Noel, MD  folic acid (FOLVITE) 1 MG tablet Take 1 mg by mouth daily after lunch. 05/11/18  Yes [provider]  furosemide (LASIX) 40 MG tablet Take 1 tablet (40 mg total) by mouth daily. 05/29/18 05/29/19 Yes Oswald Hillock, MD  gabapentin (NEURONTIN) 800 MG tablet Take 800 mg by mouth 2 (two) times daily.   Yes [provider]  ipratropium-albuterol (DUONEB) 0.5-2.5 (3) MG/3ML SOLN Take 3 mLs by nebulization every 6 (six) hours as needed (Shortness of breath). 05/29/18  Yes Lama, Marge Duncans, MD  latanoprost (XALATAN) 0.005 % ophthalmic solution Place 1 drop into both eyes at bedtime.   Yes [provider]  metaxalone (SKELAXIN) 800 MG tablet TAKE 1 TABLET (800 MG TOTAL) BY MOUTH 3 (THREE) TIMES DAILY AS NEEDED FOR MUSCLE SPASMS. 08/02/18  Yes Deveshwar, Abel Presto, MD  modafinil (PROVIGIL) 100 MG tablet Take 100 mg by mouth daily. 04/24/18  Yes [provider]  Multiple Vitamins-Minerals (HAIR/SKIN/NAILS/BIOTIN) TABS Take 1 tablet by mouth daily after lunch.   Yes [provider]  omeprazole (PRILOSEC) 20 MG capsule Take by mouth daily. 06/01/18  Yes [provider]  OXYGEN Inhale 4 L into the lungs continuous.   Yes [provider]  potassium chloride 20 MEQ TBCR Take 20 mEq by mouth daily. 05/29/18  Yes Oswald Hillock, MD  rizatriptan (MAXALT-MLT) 10 MG disintegrating tablet Take 10 mg by mouth as needed for migraine. May repeat in 2 hours if needed   Yes [provider]  simvastatin (ZOCOR) 10 MG tablet Take 10 mg by mouth at bedtime.   Yes [provider]  tiZANidine (ZANAFLEX) 4 MG tablet TAKE 1 TABLET (4 MG TOTAL) BY MOUTH AT BEDTIME AS NEEDED FOR MUSCLE SPASMS. 08/01/18  Yes Deveshwar, Abel Presto, MD  topiramate (TOPAMAX) 25 MG tablet Take 50 mg by mouth daily.    Yes [provider]  traZODone (DESYREL) 100 MG tablet Take 100 mg by mouth at bedtime.    Yes [provider]  vitamin B-12  (CYANOCOBALAMIN) 1000 MCG tablet Take 1,000 mcg by mouth daily after lunch.   Yes [provider]  docusate sodium (COLACE) 100 MG capsule Take 100 mg by mouth 3 (three) times daily.    [provider]    Family History Family History  Problem Relation Age of Onset  . Diabetes Mother   . Hypertension Mother   . CAD Mother   . Diabetes Father   . Lung cancer Father   . Diabetes Sister     Social History Social History   Tobacco Use  . Smoking status: Former Smoker    Packs/day: 1.00    Years: 39.00    Pack years: 39.00    Types: Cigarettes    Start date: 07/21/2018  . Smokeless tobacco: Never Used  . Tobacco comment: resumed smoking less than a pack a day  Substance Use Topics  . Alcohol use: No  Frequency: Never  . Drug use: No     Allergies   Citrus and Lisinopril   Review of Systems Review of Systems  Constitutional: Negative for fatigue and fever.  HENT: Negative for congestion, sinus pressure and sore throat.   Eyes: Negative for photophobia, pain and visual disturbance.  Respiratory: Negative for cough and shortness of breath.   Cardiovascular: Negative for chest pain.  Gastrointestinal: Negative for abdominal pain, nausea and vomiting.  Genitourinary: Negative for decreased urine volume.  Musculoskeletal: Positive for arthralgias, joint swelling and myalgias. Negative for neck pain and neck stiffness.  Neurological: Negative for dizziness, syncope, facial asymmetry, speech difficulty, weakness, light-headedness, numbness and headaches.     Physical Exam Triage Vital Signs ED Triage Vitals  Enc Vitals Group     BP 08/29/18 0811 (!) 115/59     Pulse Rate 08/29/18 0811 91     Resp 08/29/18 0811 18     Temp 08/29/18 0811 98 F (36.7 C)     Temp Source 08/29/18 0811 Oral     SpO2 08/29/18 0811 95 %     Weight --      Height --      Head Circumference --      Peak Flow --      Pain Score 08/29/18 0816 7     Pain Loc --      Pain  Edu? --      Excl. in [place]? --    No data found.  Updated Vital Signs BP (!) 115/59 (BP Location: Left Arm)   Pulse 91   Temp 98 F (36.7 C) (Oral)   Resp 18   LMP 08/19/2012   SpO2 95%   Visual Acuity Right Eye Distance:   Left Eye Distance:   Bilateral Distance:    Right Eye Near:   Left Eye Near:    Bilateral Near:     Physical Exam  Constitutional: She appears well-developed and well-nourished. No distress.  HENT:  Head: Normocephalic and atraumatic.  Eyes: Conjunctivae are normal.  Neck: Neck supple.  Cardiovascular: Normal rate and regular rhythm.  No murmur heard. Pulmonary/Chest: Effort normal and breath sounds normal. No respiratory distress.  Breathing comfortably at rest, CTABL, no wheezing, rales or other adventitious sounds auscultated  Wearing O2  Abdominal: Soft. There is no tenderness.  Musculoskeletal: She exhibits no edema.  Tenderness to palpation of distal ulna and radius, tender throughout forearm of ulna and radius, tender at elbow, resisting full extension and flexion of her elbow  No overlying bruising or obvious deformity, mild swelling  Neurological: She is alert.  Skin: Skin is warm and dry.  Psychiatric: She has a normal mood and affect.  Nursing note and vitals reviewed.    UC Treatments / Results  Labs (all labs ordered are listed, but only abnormal results are displayed) Labs Reviewed - No data to display  EKG None  Radiology Dg Elbow Complete Left  Result Date: 08/29/2018 CLINICAL DATA:  Pain after fall EXAM: LEFT ELBOW - COMPLETE 3+ VIEW COMPARISON:  None. FINDINGS: Possible subtle step-off in the radial head on the AP view. Degenerative changes. No other bony abnormalities. Joint effusion with a displaced anterior fat pad and visualization of the posterior fat pad. IMPRESSION: The joint effusion suggests a fracture. While no definitive fractures are noted. There may be a slight step-off in the radial head on one view  suggesting the possibility of a nondisplaced subtle radial head fracture. Electronically Signed   By: Shanon Brow  Mee Hives M.D   On: 08/29/2018 09:08   Dg Forearm Left  Result Date: 08/29/2018 CLINICAL DATA:  Pain after fall. EXAM: LEFT FOREARM - 2 VIEW COMPARISON:  None FINDINGS: Degenerative changes in the elbow. A posterior fat pad is identified. No obvious fracture noted. IMPRESSION: Joint effusion with a posterior fat pad sign in the elbow suggest an occult fracture. No definitive fracture noted. Electronically Signed   By: Dorise Bullion III M.D   On: 08/29/2018 09:06    Procedures Procedures (including critical care time)  Medications Ordered in UC Medications - No data to display  Initial Impression / Assessment and Plan / UC Course  I have reviewed the triage vital signs and the nursing notes.  Pertinent labs & imaging results that were available during my care of the patient were reviewed by me and considered in my medical decision making (see chart for details).     Possible radial head fracture due to fat pad and joint effusion, will treat as such with posterior long-arm splint and sling, follow-up with orthopedics.  Anti-inflammatories for pain and swelling.Discussed strict return precautions. Patient verbalized understanding and is agreeable with plan.  Final Clinical Impressions(s) / UC Diagnoses   Final diagnoses:  Closed nondisplaced fracture of head of left radius, initial encounter  Fall, initial encounter     Discharge Instructions     It appears that you have a fracture at your elbow Please follow-up with orthopedics for further management and treatment of this Mainly use Tylenol [phone] mg every 4-6 hours, may supplement with ibuprofen if Tylenol not helping pain  Please follow-up if developing worsening swelling, difficulty moving fingers or pain in the arm after applying the splint     ED Prescriptions    None     Controlled Substance  Prescriptions [place] Controlled Substance Registry consulted? Not Applicable   Janith Lima, [place] 08/29/18 1012

## 2018-08-29 NOTE — ED Notes (Signed)
Ortho Tech returned page and will be here asap.

## 2018-08-29 NOTE — ED Notes (Signed)
Ortho Tech has been paged for short arm splint.

## 2018-08-31 ENCOUNTER — Other Ambulatory Visit: Payer: Self-pay | Admitting: Rheumatology

## 2018-08-31 ENCOUNTER — Telehealth: Payer: Self-pay | Admitting: Pulmonary Disease

## 2018-08-31 ENCOUNTER — Encounter (HOSPITAL_COMMUNITY): Payer: 59

## 2018-08-31 DIAGNOSIS — J9611 Chronic respiratory failure with hypoxia: Secondary | ICD-10-CM

## 2018-08-31 NOTE — Telephone Encounter (Signed)
Teresa Roberts, pulm rehab, is returning call. Per Teresa Roberts, unless there are questions for her, she does not need a call back. Cb is [phone]

## 2018-08-31 NOTE — Telephone Encounter (Signed)
OK- order placed for o2 oxymiser

## 2018-08-31 NOTE — Telephone Encounter (Signed)
LMTCB for [employer]

## 2018-08-31 NOTE — Progress Notes (Signed)
Pulmonary Individual Treatment Plan  Patient Details  Name: Teresa Roberts MRN: [id number] Date of Birth: August 23, 1961 Referring Provider:     Pulmonary Rehab Walk Test from 08/01/2018 in [place]  Referring Provider  Dr. Elsworth Soho      Initial Encounter Date:    Pulmonary Rehab Walk Test from 08/01/2018 in [place]  Date  08/03/18      Visit Diagnosis: Chronic respiratory failure with hypoxia ([place])  Patient's Home Medications on Admission:   Current Outpatient Medications:  .  albuterol (PROVENTIL) (2.5 MG/3ML) 0.083% nebulizer solution, Take 3 mLs (2.5 mg total) by nebulization every 6 (six) hours as needed for wheezing or shortness of breath., Disp: 75 mL, Rfl: 12 .  amLODipine (NORVASC) 2.5 MG tablet, Take 2.5 mg by mouth daily., Disp: , Rfl: 0 .  aspirin 81 MG EC tablet, Take 81 mg by mouth daily after lunch., Disp: , Rfl: 0 .  busPIRone (BUSPAR) 15 MG tablet, Take 15 mg by mouth 2 (two) times daily., Disp: , Rfl:  .  docusate sodium (COLACE) 100 MG capsule, Take 100 mg by mouth 3 (three) times daily., Disp: , Rfl:  .  DULoxetine (CYMBALTA) 30 MG capsule, Take 30 mg by mouth daily., Disp: , Rfl: 5 .  Fluticasone-Umeclidin-Vilant (TRELEGY ELLIPTA) 100-62.5-25 MCG/INH AEPB, Inhale 1 puff into the lungs daily., Disp: 28 each, Rfl: 3 .  folic acid (FOLVITE) 1 MG tablet, Take 1 mg by mouth daily after lunch., Disp: , Rfl: 0 .  furosemide (LASIX) 40 MG tablet, Take 1 tablet (40 mg total) by mouth daily., Disp: 30 tablet, Rfl: 11 .  gabapentin (NEURONTIN) 800 MG tablet, Take 800 mg by mouth 2 (two) times daily., Disp: , Rfl:  .  ipratropium-albuterol (DUONEB) 0.5-2.5 (3) MG/3ML SOLN, Take 3 mLs by nebulization every 6 (six) hours as needed (Shortness of breath)., Disp: 360 mL, Rfl: 1 .  latanoprost (XALATAN) 0.005 % ophthalmic solution, Place 1 drop into both eyes at bedtime., Disp: , Rfl:  .  metaxalone (SKELAXIN) 800 MG tablet,  TAKE 1 TABLET (800 MG TOTAL) BY MOUTH 3 (THREE) TIMES DAILY AS NEEDED FOR MUSCLE SPASMS., Disp: 90 tablet, Rfl: 0 .  modafinil (PROVIGIL) 100 MG tablet, Take 100 mg by mouth daily., Disp: , Rfl: 4 .  Multiple Vitamins-Minerals (HAIR/SKIN/NAILS/BIOTIN) TABS, Take 1 tablet by mouth daily after lunch., Disp: , Rfl:  .  omeprazole (PRILOSEC) 20 MG capsule, Take by mouth daily., Disp: , Rfl: 4 .  OXYGEN, Inhale 4 L into the lungs continuous., Disp: , Rfl:  .  potassium chloride 20 MEQ TBCR, Take 20 mEq by mouth daily., Disp: 30 tablet, Rfl: 1 .  rizatriptan (MAXALT-MLT) 10 MG disintegrating tablet, Take 10 mg by mouth as needed for migraine. May repeat in 2 hours if needed, Disp: , Rfl:  .  simvastatin (ZOCOR) 10 MG tablet, Take 10 mg by mouth at bedtime., Disp: , Rfl:  .  tiZANidine (ZANAFLEX) 4 MG tablet, TAKE 1 TABLET (4 MG TOTAL) BY MOUTH AT BEDTIME AS NEEDED FOR MUSCLE SPASMS., Disp: 30 tablet, Rfl: 0 .  topiramate (TOPAMAX) 25 MG tablet, Take 50 mg by mouth daily. , Disp: , Rfl:  .  traZODone (DESYREL) 100 MG tablet, Take 100 mg by mouth at bedtime. , Disp: , Rfl:  .  vitamin B-12 (CYANOCOBALAMIN) 1000 MCG tablet, Take 1,000 mcg by mouth daily after lunch., Disp: , Rfl:   Past Medical History: Past Medical History:  Diagnosis Date  . CAD (coronary artery disease)    a. 01/2017: cath showing 20% mid RCA stenosis and otherwise normal LAD and LCx with a preserved EF of 55 to 60%; mod pulmonary HTN  . COPD (chronic obstructive pulmonary disease) ([place])    O2 dependent  . cpap   . Depression    PTSD  . Diastolic CHF ([place])   . Fibromyalgia   . Hyperlipidemia   . Hypertension   . Migraines     Tobacco Use: Social History   Tobacco Use  Smoking Status Former Smoker  . Packs/day: 1.00  . Years: 39.00  . Pack years: 39.00  . Types: Cigarettes  . Start date: 07/21/2018  Smokeless Tobacco Never Used  Tobacco Comment   resumed smoking less than a pack a day    Labs: Recent Review  Flowsheet Data    Labs for ITP Cardiac and Pulmonary Rehab Latest Ref Rng & Units 08/09/2017 05/28/2018 07/19/2018 07/19/2018 07/19/2018   Cholestrol 0 - 200 mg/dL - 144 - - -   LDLCALC 0 - 99 mg/dL - 84 - - -   HDL >40 mg/dL - 31(L) - - -   Trlycerides <150 mg/dL - 147 - - -   Hemoglobin A1c 4.8 - 5.6 % - 6.3(H) - - -   PHART 7.350 - 7.450 - - - - -   PCO2ART 32.0 - 48.0 mmHg - - - - -   HCO3 20.0 - 28.0 mmol/L - - 28.4(H) 29.6(H) 28.7(H)   TCO2 22 - 32 mmol/L - - _0 ACIDBASEDEF 0.0 - 2.0 mmol/L - - - - -   O2SAT % 98.9 - 64.0 64.0 72.0      Capillary Blood Glucose: Lab Results  Component Value Date   GLUCAP 106 (H) 08/09/2017     Pulmonary Assessment Scores: Pulmonary Assessment Scores    Row Name 08/02/18 1515 08/03/18 0748       ADL UCSD   ADL Phase  Entry  Entry    SOB Score total  105  -      CAT Score   CAT Score  30  -      mMRC Score   mMRC Score  -  2       Pulmonary Function Assessment:   Exercise Target Goals: Exercise Program Goal: Individual exercise prescription set using results from initial 6 min walk test and THRR while considering  patient's activity barriers and safety.   Exercise Prescription Goal: Initial exercise prescription builds to 30-45 minutes a day of aerobic activity, 2-3 days per week.  Home exercise guidelines will be given to patient during program as part of exercise prescription that the participant will acknowledge.  Activity Barriers & Risk Stratification: Activity Barriers & Cardiac Risk Stratification - 07/28/18 1030      Activity Barriers & Cardiac Risk Stratification   Activity Barriers  Fibromyalgia;Balance Concerns;History of Falls;Joint Problems;Deconditioning;Shortness of Breath    Cardiac Risk Stratification  Moderate       6 Minute Walk: 6 Minute Walk    Row Name 08/03/18 0744         6 Minute Walk   Phase  Initial     Distance  800 feet     Walk Time  - 5 minutes     # of Rest Breaks  1 stopped by Ep  due to desats     MPH  1.5     METS  2.15  RPE  12     Perceived Dyspnea   1     Comments  used wheelchair/slow recovery/forhead probe/requested oxymizer     Resting HR  89 bpm     Resting BP  128/80     Resting Oxygen Saturation   97 %     Exercise Oxygen Saturation  during 6 min walk  81 %     Max Ex. HR  110 bpm     Max Ex. BP  140/80       Interval HR   1 Minute HR  - would not read on pulse ox     2 Minute HR  - placed forhead probe     3 Minute HR  106     4 Minute HR  106     5 Minute HR  110     6 Minute HR  110     2 Minute Post HR  98     Interval Heart Rate?  Yes       Interval Oxygen   Interval Oxygen?  Yes     Baseline Oxygen Saturation %  97 %     1 Minute Liters of Oxygen  4 L     2 Minute Liters of Oxygen  4 L     3 Minute Oxygen Saturation %  81 %     3 Minute Liters of Oxygen  4 L     4 Minute Oxygen Saturation %  82 %     4 Minute Liters of Oxygen  8 L     5 Minute Oxygen Saturation %  87 %     5 Minute Liters of Oxygen  10 L     6 Minute Oxygen Saturation %  85 %     6 Minute Liters of Oxygen  10 L     2 Minute Post Oxygen Saturation %  88 %     2 Minute Post Liters of Oxygen  10 L        Oxygen Initial Assessment: Oxygen Initial Assessment - 08/03/18 0747      Initial 6 min Walk   Oxygen Used  Continuous;E-Tanks    Liters per minute  10   maintained oxygen at 85%-will re-evaluate on first day of rehab     Program Oxygen Prescription   Program Oxygen Prescription  Continuous;E-Tanks    Liters per minute  10    Comments  Will re-evaluate patient's needs since 10 liters only maintained her at 85% with walking. Requested oxymizer       Intervention   Short Term Goals  To learn and exhibit compliance with exercise, home and travel O2 prescription;To learn and understand importance of maintaining oxygen saturations>88%;To learn and demonstrate proper use of respiratory medications;To learn and understand importance of monitoring SPO2 with pulse  oximeter and demonstrate accurate use of the pulse oximeter.;To learn and demonstrate proper pursed lip breathing techniques or other breathing techniques.    Long  Term Goals  Exhibits compliance with exercise, home and travel O2 prescription;Verbalizes importance of monitoring SPO2 with pulse oximeter and return demonstration;Maintenance of O2 saturations>88%;Exhibits proper breathing techniques, such as pursed lip breathing or other method taught during program session;Compliance with respiratory medication;Demonstrates proper use of MDI's       Oxygen Re-Evaluation: Oxygen Re-Evaluation    Row Name 08/31/18 0744             Program Oxygen Prescription   Program Oxygen Prescription  Continuous;E-Tanks       Liters per minute  10       Comments  Will re-evaluate patient's needs since 10 liters only maintained her at 85% with walking. Requested oxymizer          Home Oxygen   Home Oxygen Device  Portable Concentrator;Home Concentrator       Sleep Oxygen Prescription  CPAP;Continuous       Liters per minute  4       Home Exercise Oxygen Prescription  Continuous       Liters per minute  10       Home at Rest Exercise Oxygen Prescription  Pulsed       Liters per minute  4       Compliance with Home Oxygen Use  Yes         Goals/Expected Outcomes   Short Term Goals  To learn and exhibit compliance with exercise, home and travel O2 prescription;To learn and understand importance of maintaining oxygen saturations>88%;To learn and demonstrate proper use of respiratory medications;To learn and understand importance of monitoring SPO2 with pulse oximeter and demonstrate accurate use of the pulse oximeter.;To learn and demonstrate proper pursed lip breathing techniques or other breathing techniques.       Long  Term Goals  Exhibits compliance with exercise, home and travel O2 prescription;Verbalizes importance of monitoring SPO2 with pulse oximeter and return demonstration;Maintenance of O2  saturations>88%;Exhibits proper breathing techniques, such as pursed lip breathing or other method taught during program session;Compliance with respiratory medication;Demonstrates proper use of MDI's       Goals/Expected Outcomes  understanding of high liter flows          Oxygen Discharge (Final Oxygen Re-Evaluation): Oxygen Re-Evaluation - 08/31/18 0744      Program Oxygen Prescription   Program Oxygen Prescription  Continuous;E-Tanks    Liters per minute  10    Comments  Will re-evaluate patient's needs since 10 liters only maintained her at 85% with walking. Requested oxymizer       Home Oxygen   Home Oxygen Device  Portable Concentrator;Home Concentrator    Sleep Oxygen Prescription  CPAP;Continuous    Liters per minute  4    Home Exercise Oxygen Prescription  Continuous    Liters per minute  10    Home at Rest Exercise Oxygen Prescription  Pulsed    Liters per minute  4    Compliance with Home Oxygen Use  Yes      Goals/Expected Outcomes   Short Term Goals  To learn and exhibit compliance with exercise, home and travel O2 prescription;To learn and understand importance of maintaining oxygen saturations>88%;To learn and demonstrate proper use of respiratory medications;To learn and understand importance of monitoring SPO2 with pulse oximeter and demonstrate accurate use of the pulse oximeter.;To learn and demonstrate proper pursed lip breathing techniques or other breathing techniques.    Long  Term Goals  Exhibits compliance with exercise, home and travel O2 prescription;Verbalizes importance of monitoring SPO2 with pulse oximeter and return demonstration;Maintenance of O2 saturations>88%;Exhibits proper breathing techniques, such as pursed lip breathing or other method taught during program session;Compliance with respiratory medication;Demonstrates proper use of MDI's    Goals/Expected Outcomes  understanding of high liter flows       Initial Exercise Prescription: Initial  Exercise Prescription - 08/03/18 0700      Date of Initial Exercise RX and Referring Provider   Date  08/03/18    Referring Provider  Dr.  Alva      Oxygen   Oxygen  Continuous    Liters  10      Recumbant Bike   Level  2    Watts  10    Minutes  17      NuStep   Level  2    SPM  80    Minutes  17    METs  1.5      Track   Laps  10    Minutes  17      Prescription Details   Frequency (times per week)  2    Duration  Progress to 45 minutes of aerobic exercise without signs/symptoms of physical distress      Intensity   THRR 40-80% of Max Heartrate  66-132    Ratings of Perceived Exertion  11-13    Perceived Dyspnea  0-4      Progression   Progression  Continue to progress workloads to maintain intensity without signs/symptoms of physical distress.      Resistance Training   Training Prescription  Yes    Weight  blue bands    Reps  10-15       Perform Capillary Blood Glucose checks as needed.  Exercise Prescription Changes: Exercise Prescription Changes    Row Name 08/10/18 1627 08/24/18 0907           Response to Exercise   Blood Pressure (Admit)  106/68  120/70      Blood Pressure (Exercise)  124/78  132/54      Blood Pressure (Exit)  116/72  104/66      Heart Rate (Admit)  95 bpm  83 bpm      Heart Rate (Exercise)  118 bpm  132 bpm      Heart Rate (Exit)  87 bpm  80 bpm      Oxygen Saturation (Admit)  91 %  92 %      Oxygen Saturation (Exercise)  95 %  90 %      Oxygen Saturation (Exit)  96 %  95 %      Rating of Perceived Exertion (Exercise)  13  15      Perceived Dyspnea (Exercise)  2  3      Duration  Continue with 45 min of aerobic exercise without signs/symptoms of physical distress.  Continue with 45 min of aerobic exercise without signs/symptoms of physical distress.      Intensity  THRR unchanged  THRR unchanged        Resistance Training   Training Prescription  Yes  Yes      Weight  blue bands  blue bands      Reps  10-15  10-15         Oxygen   Oxygen  Continuous  Continuous      Liters  10  10        Recumbant Bike   Level  2  3      Watts  10  10      Minutes  17  17        NuStep   Level  -  3      SPM  -  80      Minutes  -  17      METs  -  2.3        Track   Laps  12  -      Minutes  17  -  Exercise Comments:   Exercise Goals and Review: Exercise Goals    Row Name 07/28/18 1030             Exercise Goals   Increase Physical Activity  Yes       Intervention  Provide advice, education, support and counseling about physical activity/exercise needs.;Develop an individualized exercise prescription for aerobic and resistive training based on initial evaluation findings, risk stratification, comorbidities and participant's personal goals.       Expected Outcomes  Short Term: Attend rehab on a regular basis to increase amount of physical activity.;Long Term: Add in home exercise to make exercise part of routine and to increase amount of physical activity.;Long Term: Exercising regularly at least 3-5 days a week.       Increase Strength and Stamina  Yes       Intervention  Provide advice, education, support and counseling about physical activity/exercise needs.;Develop an individualized exercise prescription for aerobic and resistive training based on initial evaluation findings, risk stratification, comorbidities and participant's personal goals.       Expected Outcomes  Short Term: Increase workloads from initial exercise prescription for resistance, speed, and METs.;Short Term: Perform resistance training exercises routinely during rehab and add in resistance training at home;Long Term: Improve cardiorespiratory fitness, muscular endurance and strength as measured by increased METs and functional capacity (6MWT)       Able to understand and use rate of perceived exertion (RPE) scale  Yes       Intervention  Provide education and explanation on how to use RPE scale       Expected Outcomes  Short  Term: Able to use RPE daily in rehab to express subjective intensity level;Long Term:  Able to use RPE to guide intensity level when exercising independently       Able to understand and use Dyspnea scale  Yes       Intervention  Provide education and explanation on how to use Dyspnea scale       Expected Outcomes  Short Term: Able to use Dyspnea scale daily in rehab to express subjective sense of shortness of breath during exertion;Long Term: Able to use Dyspnea scale to guide intensity level when exercising independently       Knowledge and understanding of Target Heart Rate Range (THRR)  Yes       Intervention  Provide education and explanation of THRR including how the numbers were predicted and where they are located for reference       Expected Outcomes  Short Term: Able to state/look up THRR;Long Term: Able to use THRR to govern intensity when exercising independently;Short Term: Able to use daily as guideline for intensity in rehab       Understanding of Exercise Prescription  Yes       Intervention  Provide education, explanation, and written materials on patient's individual exercise prescription       Expected Outcomes  Short Term: Able to explain program exercise prescription;Long Term: Able to explain home exercise prescription to exercise independently          Exercise Goals Re-Evaluation : Exercise Goals Re-Evaluation    Row Name 08/31/18 0745             Exercise Goal Re-Evaluation   Exercise Goals Review  Increase Physical Activity;Increase Strength and Stamina;Able to understand and use rate of perceived exertion (RPE) scale;Able to understand and use Dyspnea scale;Knowledge and understanding of Target Heart Rate Range (THRR);Understanding of Exercise Prescription  Comments  Patient is eager to progress--however we are having to titrate her to high liter flows. Patients MET average is 2.3. Has only attended 5 rehab sessions. Will cont. to monitor and progress as able.        Expected Outcomes  Through rehab and exercising at home, patient will find it easier to carry out ADL's and will gain the confidence to establish an exercise routine at home.           Discharge Exercise Prescription (Final Exercise Prescription Changes): Exercise Prescription Changes - 08/24/18 0907      Response to Exercise   Blood Pressure (Admit)  120/70    Blood Pressure (Exercise)  132/54    Blood Pressure (Exit)  104/66    Heart Rate (Admit)  83 bpm    Heart Rate (Exercise)  132 bpm    Heart Rate (Exit)  80 bpm    Oxygen Saturation (Admit)  92 %    Oxygen Saturation (Exercise)  90 %    Oxygen Saturation (Exit)  95 %    Rating of Perceived Exertion (Exercise)  15    Perceived Dyspnea (Exercise)  3    Duration  Continue with 45 min of aerobic exercise without signs/symptoms of physical distress.    Intensity  THRR unchanged      Resistance Training   Training Prescription  Yes    Weight  blue bands    Reps  10-15      Oxygen   Oxygen  Continuous    Liters  10      Recumbant Bike   Level  3    Watts  10    Minutes  17      NuStep   Level  3    SPM  80    Minutes  17    METs  2.3       Nutrition:  Target Goals: Understanding of nutrition guidelines, daily intake of sodium <154m, cholesterol <2091m calories 30% from fat and 7% or less from saturated fats, daily to have 5 or more servings of fruits and vegetables.  Biometrics:    Nutrition Therapy Plan and Nutrition Goals: Nutrition Therapy & Goals - 07/31/18 1442      Nutrition Therapy   Diet  heart healthy      Personal Nutrition Goals   Nutrition Goal  Pt to identify and limit food sources of sodium,saturated fats, trans fats and refined carbohydrates    Personal Goal #2  Identify food quantities necessary to achieve wt loss of  -2# per week to a goal wt loss of 2.7-10.9 kg (6-24 lb) at graduation from pulmonary rehab.      Intervention Plan   Intervention  Prescribe, educate and counsel  regarding individualized specific dietary modifications aiming towards targeted core components such as weight, hypertension, lipid management, diabetes, heart failure and other comorbidities.    Expected Outcomes  Short Term Goal: Understand basic principles of dietary content, such as calories, fat, sodium, cholesterol and nutrients.;Long Term Goal: Adherence to prescribed nutrition plan.       Nutrition Assessments: Nutrition Assessments - 07/31/18 1442      Rate Your Plate Scores   Pre Score  42      MEDFICTS Scores   Pre Score  --       Nutrition Goals Re-Evaluation:   Nutrition Goals Discharge (Final Nutrition Goals Re-Evaluation):   Psychosocial: Target Goals: Acknowledge presence or absence of significant depression and/or stress, maximize coping skills,  provide positive support system. Participant is able to verbalize types and ability to use techniques and skills needed for reducing stress and depression.  Initial Review & Psychosocial Screening: Initial Psych Review & Screening - 07/28/18 1038      Initial Review   Current issues with  Current Depression;History of Depression;Current Stress Concerns    Source of Stress Concerns  Chronic Illness;Unable to perform yard/household activities;Unable to participate in former interests or hobbies    Comments  Pt with history and current history of depression.  Pt would like to seek counsel with staff in the [facility]?  Yes    Comments  husband       Barriers   Psychosocial barriers to participate in program  The patient should benefit from training in stress management and relaxation.      Screening Interventions   Interventions  Encouraged to exercise;To provide support and resources with identified psychosocial needs    Expected Outcomes  Short Term goal: Identification and review with participant of any Quality of Life or Depression concerns found by scoring  the questionnaire.;Long Term goal: The participant improves quality of Life and PHQ9 Scores as seen by post scores and/or verbalization of changes;Short Term goal: Utilizing psychosocial counselor, staff and physician to assist with identification of specific Stressors or current issues interfering with healing process. Setting desired goal for each stressor or current issue identified.;Long Term Goal: Stressors or current issues are controlled or eliminated.       Quality of Life Scores:  Scores of 19 and below usually indicate a poorer quality of life in these areas.  A difference of  2-3 points is a clinically meaningful difference.  A difference of 2-3 points in the total score of the Quality of Life Index has been associated with significant improvement in overall quality of life, self-image, physical symptoms, and general health in studies assessing change in quality of life.  PHQ-9: Recent Review Flowsheet Data    Depression screen [facility] 2/9 07/28/2018   Decreased Interest 3   Down, Depressed, Hopeless 2   PHQ - 2 Score 5   Altered sleeping 0   Tired, decreased energy 3   Change in appetite 3   Feeling bad or failure about yourself  2   Trouble concentrating 2   Moving slowly or fidgety/restless 3   Suicidal thoughts 0   PHQ-9 Score 18   Difficult doing work/chores Very difficult     Interpretation of Total Score  Total Score Depression Severity:  1-4 = Minimal depression, 5-9 = Mild depression, 10-14 = Moderate depression, 15-19 = Moderately severe depression, 20-27 = Severe depression   Psychosocial Evaluation and Intervention: Psychosocial Evaluation - 08/31/18 0947      Psychosocial Evaluation & Interventions   Interventions  Stress management education;Relaxation education;Encouraged to exercise with the program and follow exercise prescription    Comments  Pt initial thought she needed assistance from the spiritiual care department but feels she is in a "good head space".  Pt aware if this should change to reach out to rehab staff.    Expected Outcomes  Pt will seek out resources when she feels overwhelmed, stressed and depressed.    Continue Psychosocial Services   Follow up required by staff       Psychosocial Re-Evaluation: Psychosocial Re-Evaluation    [place] Name 08/31/18 [phone]  Psychosocial Re-Evaluation   Current issues with  Current Stress Concerns;Current Depression;History of Depression       Comments  Pt has multiple issues as it relates to the chronic nautre of her lung disease process.  Pt feels supported by her husband who has assumed a majority of the duties within the household. This at times for pt is more of stress than a help.       Interventions  Stress management education;Encouraged to attend Pulmonary Rehabilitation for the exercise;Relaxation education       Continue Psychosocial Services   Follow up required by staff          Psychosocial Discharge (Final Psychosocial Re-Evaluation): Psychosocial Re-Evaluation - 08/31/18 0949      Psychosocial Re-Evaluation   Current issues with  Current Stress Concerns;Current Depression;History of Depression    Comments  Pt has multiple issues as it relates to the chronic nautre of her lung disease process.  Pt feels supported by her husband who has assumed a majority of the duties within the household. This at times for pt is more of stress than a help.    Interventions  Stress management education;Encouraged to attend Pulmonary Rehabilitation for the exercise;Relaxation education    Continue Psychosocial Services   Follow up required by staff       Education: Education Goals: Education classes will be provided on a weekly basis, covering required topics. Participant will state understanding/return demonstration of topics presented.  Learning Barriers/Preferences: Learning Barriers/Preferences - 07/28/18 1040      Learning Barriers/Preferences   Learning Barriers  Sight     Learning Preferences  Computer/Internet;Group Instruction;Individual Instruction;Skilled Demonstration;Verbal Instruction;Written Material       Education Topics: Risk Factor Reduction:  -Group instruction that is supported by a PowerPoint presentation. Instructor discusses the definition of a risk factor, different risk factors for pulmonary disease, and how the heart and lungs work together.     Nutrition for Pulmonary Patient:  -Group instruction provided by PowerPoint slides, verbal discussion, and written materials to support subject matter. The instructor gives an explanation and review of healthy diet recommendations, which includes a discussion on weight management, recommendations for fruit and vegetable consumption, as well as protein, fluid, caffeine, fiber, sodium, sugar, and alcohol. Tips for eating when patients are short of breath are discussed.   Pursed Lip Breathing:  -Group instruction that is supported by demonstration and informational handouts. Instructor discusses the benefits of pursed lip and diaphragmatic breathing and detailed demonstration on how to preform both.     Oxygen Safety:  -Group instruction provided by PowerPoint, verbal discussion, and written material to support subject matter. There is an overview of "What is Oxygen" and "Why do we need it".  Instructor also reviews how to create a safe environment for oxygen use, the importance of using oxygen as prescribed, and the risks of noncompliance. There is a brief discussion on traveling with oxygen and resources the patient may utilize.   PULMONARY REHAB OTHER RESPIRATORY from 08/24/2018 in [place]  Date  08/17/18  Educator  Cloyde Reams  Instruction Review Code  1- Verbalizes Understanding      Oxygen Equipment:  -Group instruction provided by [employer] utilizing handouts, written materials, and Insurance underwriter.   PULMONARY REHAB OTHER RESPIRATORY from  08/24/2018 in [place]  Date  08/24/18  Educator  [place]  Instruction Review Code  1- Verbalizes Understanding      Signs and Symptoms:  -  Group instruction provided by written material and verbal discussion to support subject matter. Warning signs and symptoms of infection, stroke, and heart attack are reviewed and when to call the physician/911 reinforced. Tips for preventing the spread of infection discussed.   PULMONARY REHAB OTHER RESPIRATORY from 08/24/2018 in [place]  Date  08/03/18  Educator  rn  Instruction Review Code  1- Verbalizes Understanding      Advanced Directives:  -Group instruction provided by verbal instruction and written material to support subject matter. Instructor reviews Advanced Directive laws and proper instruction for filling out document.   Pulmonary Video:  -Group video education that reviews the importance of medication and oxygen compliance, exercise, good nutrition, pulmonary hygiene, and pursed lip and diaphragmatic breathing for the pulmonary patient.   Exercise for the Pulmonary Patient:  -Group instruction that is supported by a PowerPoint presentation. Instructor discusses benefits of exercise, core components of exercise, frequency, duration, and intensity of an exercise routine, importance of utilizing pulse oximetry during exercise, safety while exercising, and options of places to exercise outside of rehab.     Pulmonary Medications:  -Verbally interactive group education provided by instructor with focus on inhaled medications and proper administration.   Anatomy and Physiology of the Respiratory System and Intimacy:  -Group instruction provided by PowerPoint, verbal discussion, and written material to support subject matter. Instructor reviews respiratory cycle and anatomical components of the respiratory system and their functions. Instructor also reviews differences in  obstructive and restrictive respiratory diseases with examples of each. Intimacy, Sex, and Sexuality differences are reviewed with a discussion on how relationships can change when diagnosed with pulmonary disease. Common sexual concerns are reviewed.   MD DAY -A group question and answer session with a medical doctor that allows participants to ask questions that relate to their pulmonary disease state.   OTHER EDUCATION -Group or individual verbal, written, or video instructions that support the educational goals of the pulmonary rehab program.   Holiday Eating Survival Tips:  -Group instruction provided by PowerPoint slides, verbal discussion, and written materials to support subject matter. The instructor gives patients tips, tricks, and techniques to help them not only survive but enjoy the holidays despite the onslaught of food that accompanies the holidays.   Knowledge Questionnaire Score: Knowledge Questionnaire Score - 08/02/18 1515      Knowledge Questionnaire Score   Pre Score  14/18       Core Components/Risk Factors/Patient Goals at Admission: Personal Goals and Risk Factors at Admission - 07/28/18 1041      Core Components/Risk Factors/Patient Goals on Admission    Weight Management  Yes;Weight Loss    Admit Weight  210 lb 5.1 oz (95.4 kg)    Goal Weight: Short Term  195 lb (88.5 kg)    Goal Weight: Long Term  175 lb (79.4 kg)    Expected Outcomes  Short Term: Continue to assess and modify interventions until short term weight is achieved;Long Term: Adherence to nutrition and physical activity/exercise program aimed toward attainment of established weight goal;Weight Loss: Understanding of general recommendations for a balanced deficit meal plan, which promotes 1-2 lb weight loss per week and includes a negative energy balance of [phone] kcal/d;Understanding recommendations for meals to include 15-35% energy as protein, 25-35% energy from fat, 35-60% energy from  carbohydrates, less than 262m of dietary cholesterol, 20-35 gm of total fiber daily;Understanding of distribution of calorie intake throughout the day with the consumption of 4-5 meals/snacks  Improve shortness of breath with ADL's  Yes    Intervention  Provide education, individualized exercise plan and daily activity instruction to help decrease symptoms of SOB with activities of daily living.    Expected Outcomes  Short Term: Improve cardiorespiratory fitness to achieve a reduction of symptoms when performing ADLs;Long Term: Be able to perform more ADLs without symptoms or delay the onset of symptoms    Lipids  Yes    Intervention  Provide education and support for participant on nutrition & aerobic/resistive exercise along with prescribed medications to achieve LDL <74m, HDL >436m    Expected Outcomes  Short Term: Participant states understanding of desired cholesterol values and is compliant with medications prescribed. Participant is following exercise prescription and nutrition guidelines.;Long Term: Cholesterol controlled with medications as prescribed, with individualized exercise RX and with personalized nutrition plan. Value goals: LDL < 7074mHDL > 40 mg.    Stress  Yes    Intervention  Offer individual and/or small group education and counseling on adjustment to heart disease, stress management and health-related lifestyle change. Teach and support self-help strategies.;Refer participants experiencing significant psychosocial distress to appropriate mental health specialists for further evaluation and treatment. When possible, include family members and significant others in education/counseling sessions.    Expected Outcomes  Short Term: Participant demonstrates changes in health-related behavior, relaxation and other stress management skills, ability to obtain effective social support, and compliance with psychotropic medications if prescribed.;Long Term: Emotional wellbeing is indicated  by absence of clinically significant psychosocial distress or social isolation.    Personal Goal Other  Yes    Personal Goal  Pt would like become physically conditioned that she no longer requires as much or no oxygen    Intervention  Proper breathing techniques such as deep breathing and purse lip breathing    Expected Outcomes  Pt will report not needing as much oxygen with acceptable range for oxygen saturation and no shortness of breath       Core Components/Risk Factors/Patient Goals Review:  Goals and Risk Factor Review    Row Name 08/31/18 0959 08/31/18 1000           Core Components/Risk Factors/Patient Goals Review   Personal Goals Review  Weight Management/Obesity;Develop more efficient breathing techniques such as purse lipped breathing and diaphragmatic breathing and practicing self-pacing with activity.;Stress;Increase knowledge of respiratory medications and ability to use respiratory devices properly.;Improve shortness of breath with ADL's;Lipids  Weight Management/Obesity;Develop more efficient breathing techniques such as purse lipped breathing and diaphragmatic breathing and practicing self-pacing with activity.;Stress;Increase knowledge of respiratory medications and ability to use respiratory devices properly.;Improve shortness of breath with ADL's;Lipids      Review  -  pt completed 5 exercise sessions and 2 education classes.  Pt weight shows an increase of 2 kg since 9/24. Pt will meet with the dietician to discuss meal plan and strategies to achieve desired weight loss.  Pt rates dyspnea of 2. Pt does well with PLB with verbal cues and reminders.  Continue to coach pt on breathing techniques.  Pt desires to come off of her oxygen however pt requires 8 -10L with exercise.  Unsure if this is a realistic goal for this pt.  Pt stress level remains the same due to her inability to perform household chores and having her husband do the work for her.  Talked with pt on gradually  adding smaller task in as she is able to to.  Pt workloads increase to level 3 on the  recumbent bike, nustep level 3  and 2.2/0 on the treamill.  Continue to monitor pt progress during the next 30 day assessment.      Expected Outcomes  -  See Admission Goals/Outcomes         Core Components/Risk Factors/Patient Goals at Discharge (Final Review):  Goals and Risk Factor Review - 08/31/18 1000      Core Components/Risk Factors/Patient Goals Review   Personal Goals Review  Weight Management/Obesity;Develop more efficient breathing techniques such as purse lipped breathing and diaphragmatic breathing and practicing self-pacing with activity.;Stress;Increase knowledge of respiratory medications and ability to use respiratory devices properly.;Improve shortness of breath with ADL's;Lipids    Review  pt completed 5 exercise sessions and 2 education classes.  Pt weight shows an increase of 2 kg since 9/24. Pt will meet with the dietician to discuss meal plan and strategies to achieve desired weight loss.  Pt rates dyspnea of 2. Pt does well with PLB with verbal cues and reminders.  Continue to coach pt on breathing techniques.  Pt desires to come off of her oxygen however pt requires 8 -10L with exercise.  Unsure if this is a realistic goal for this pt.  Pt stress level remains the same due to her inability to perform household chores and having her husband do the work for her.  Talked with pt on gradually adding smaller task in as she is able to to.  Pt workloads increase to level 3 on the recumbent bike, nustep level 3  and 2.2/0 on the treamill.  Continue to monitor pt progress during the next 30 day assessment.    Expected Outcomes  See Admission Goals/Outcomes       ITP Comments: ITP Comments    Row Name 07/28/18 1029 08/31/18 0946         ITP Comments  Dr. Manfred Arch, Medical Director Pulmonary  Dr. Manfred Arch, Medical Director Pulmonary         Comments: Pt completed 5 exercise  sessions.  Continue to monitor progress. Cherre Huger, BSN Cardiac and Training and development officer

## 2018-08-31 NOTE — Telephone Encounter (Signed)
Last visit:06/05/18 Next visit:12/06/18  Okay to refill per Dr. Deveshwar 

## 2018-09-01 ENCOUNTER — Telehealth: Payer: Self-pay | Admitting: Pulmonary Disease

## 2018-09-01 DIAGNOSIS — J449 Chronic obstructive pulmonary disease, unspecified: Secondary | ICD-10-CM

## 2018-09-01 DIAGNOSIS — J9611 Chronic respiratory failure with hypoxia: Secondary | ICD-10-CM

## 2018-09-01 NOTE — Telephone Encounter (Signed)
Called and spoke with patient regarding oximeter pendant to better gage her O2. Advised her that Corene Cornea with [facility] at phone [phone] ext [phone] advised She did not get her O2 with [facility] therefore they cannot fulfill the requested order. Pt advised that she rec'd her O2 from the [place] her to call the [place] and let them know she is in need of oximeter pendant. [facility] to call us and we will help her with the order. She verbalized understanding, nothing further needed.

## 2018-09-01 NOTE — Telephone Encounter (Signed)
Called [facility] with Corene Cornea who stated pt will need an OV at our office and also to have a qualifying walk due to her not having an OV since 06/19/18, with pt switching from the [place] to [facility].  Called pt to let her know the above. Pt expressed understanding. I scheduled pt an OV with Wyn Quaker, NP and stated in there that pt needs the qualifying walk due to her switching from [place] to [facility].  Nothing further needed.

## 2018-09-01 NOTE — Telephone Encounter (Signed)
Called and spoke with pt to get things clarified with the amount of O2 that she was having to use so I can make sure I had everything taken care of in the order.  Asked pt if she had a POC due to [facility] being out of them and pt states she does have an [employer] which she purchased herself.  I have placed the order for pt to be switched from the [place] to [facility].  Nothing further needed.

## 2018-09-04 ENCOUNTER — Ambulatory Visit (INDEPENDENT_AMBULATORY_CARE_PROVIDER_SITE_OTHER): Payer: 59 | Admitting: Pulmonary Disease

## 2018-09-04 ENCOUNTER — Ambulatory Visit: Payer: 59 | Admitting: Pulmonary Disease

## 2018-09-04 ENCOUNTER — Encounter: Payer: Self-pay | Admitting: Pulmonary Disease

## 2018-09-04 VITALS — BP 114/70 | HR 82 | Ht 66.0 in | Wt 215.2 lb

## 2018-09-04 DIAGNOSIS — J9611 Chronic respiratory failure with hypoxia: Secondary | ICD-10-CM

## 2018-09-04 DIAGNOSIS — J449 Chronic obstructive pulmonary disease, unspecified: Secondary | ICD-10-CM | POA: Diagnosis not present

## 2018-09-04 DIAGNOSIS — Z23 Encounter for immunization: Secondary | ICD-10-CM | POA: Diagnosis not present

## 2018-09-04 DIAGNOSIS — M25522 Pain in left elbow: Secondary | ICD-10-CM | POA: Insufficient documentation

## 2018-09-04 MED ORDER — FLUTICASONE-UMECLIDIN-VILANT 100-62.5-25 MCG/INH IN AEPB: 1.0000 | INHALATION_SPRAY | Freq: Every day | RESPIRATORY_TRACT | 0 refills | Status: DC

## 2018-09-04 NOTE — Assessment & Plan Note (Signed)
New order for oxygen as well as o2 oxymiser  >>> [facility] >>>Patient is switching from [place] >>>Patient will also need concentrator to go up to 10 L >>>6L at rest  >>>8L with exertion, based on Pulm Rehab   Keep follow-up with our office   Regular flu vaccine today

## 2018-09-04 NOTE — Patient Instructions (Addendum)
Continue Trelegy Ellipta  >>> 1 puff daily in the morning >>>rinse mouth out after use  >>> This inhaler contains 3 medications that help manage her respiratory status, contact our office if you cannot afford this medication or unable to remain on this medication >>> Samples provided today   New order for oxygen as well as o2 oxymiser  >>> [facility] >>>Patient is switching from [place] >>>Patient will also need concentrator to go up to 10 L >>>6L at rest  >>>8L with exertion, based on Pulm Rehab   Keep follow-up with our office   Regular flu vaccine today  Continue pulmonary rehab  November/2019 we will be moving! We will no longer be at our [place] location.  Be on the look out for a post card/mailer to let you know we have officially moved.  Our new address and phone number will be:  [address] [place], [place] [address] Telephone number: [phone]  It is flu season:   >>>Remember to be washing your hands regularly, using hand sanitizer, be careful to use around herself with has contact with people who are sick will increase her chances of getting sick yourself. >>> Best ways to protect herself from the flu: Receive the yearly flu vaccine, practice good hand hygiene washing with soap and also using hand sanitizer when available, eat a nutritious meals, get adequate rest, hydrate appropriately   Please contact the office if your symptoms worsen or you have concerns that you are not improving.   Thank you for choosing [facility] for your healthcare, and for allowing [place] to partner with you on your healthcare journey. I am thankful to be able to provide care to you today.   Wyn Quaker FNP-C       Influenza Virus Vaccine injection What is this medicine? INFLUENZA VIRUS VACCINE (in floo EN zuh VAHY ruhs vak SEEN) helps to reduce the risk of getting influenza also known as the flu. The vaccine only helps protect you against some  strains of the flu. This medicine may be used for other purposes; ask your health care provider or pharmacist if you have questions. COMMON BRAND NAME(S): Afluria, Agriflu, Alfuria, FLUAD, Fluarix, Fluarix Quadrivalent, Flublok, Flublok Quadrivalent, FLUCELVAX, Flulaval, Fluvirin, Fluzone, Fluzone High-Dose, Fluzone Intradermal What should I tell my health care provider before I take this medicine? They need to know if you have any of these conditions: -bleeding disorder like hemophilia -fever or infection -Guillain-Barre syndrome or other neurological problems -immune system problems -infection with the human immunodeficiency virus (HIV) or AIDS -low blood platelet counts -multiple sclerosis -an unusual or allergic reaction to influenza virus vaccine, latex, other medicines, foods, dyes, or preservatives. Different brands of vaccines contain different allergens. Some may contain latex or eggs. Talk to your doctor about your allergies to make sure that you get the right vaccine. -pregnant or trying to get pregnant -breast-feeding How should I use this medicine? This vaccine is for injection into a muscle or under the skin. It is given by a health care professional. A copy of Vaccine Information Statements will be given before each vaccination. Read this sheet carefully each time. The sheet may change frequently. Talk to your healthcare provider to see which vaccines are right for you. Some vaccines should not be used in all age groups. Overdosage: If you think you have taken too much of this medicine contact a poison control center or emergency room at once. NOTE: This medicine is only  for you. Do not share this medicine with others. What if I miss a dose? This does not apply. What may interact with this medicine? -chemotherapy or radiation therapy -medicines that lower your immune system like etanercept, anakinra, infliximab, and adalimumab -medicines that treat or prevent blood clots like  warfarin -phenytoin -steroid medicines like prednisone or cortisone -theophylline -vaccines This list may not describe all possible interactions. Give your health care provider a list of all the medicines, herbs, non-prescription drugs, or dietary supplements you use. Also tell them if you smoke, drink alcohol, or use illegal drugs. Some items may interact with your medicine. What should I watch for while using this medicine? Report any side effects that do not go away within 3 days to your doctor or health care professional. Call your health care provider if any unusual symptoms occur within 6 weeks of receiving this vaccine. You may still catch the flu, but the illness is not usually as bad. You cannot get the flu from the vaccine. The vaccine will not protect against colds or other illnesses that may cause fever. The vaccine is needed every year. What side effects may I notice from receiving this medicine? Side effects that you should report to your doctor or health care professional as soon as possible: -allergic reactions like skin rash, itching or hives, swelling of the face, lips, or tongue Side effects that usually do not require medical attention (report to your doctor or health care professional if they continue or are bothersome): -fever -headache -muscle aches and pains -pain, tenderness, redness, or swelling at the injection site -tiredness This list may not describe all possible side effects. Call your doctor for medical advice about side effects. You may report side effects to FDA at 1-800-FDA-1088. Where should I keep my medicine? The vaccine will be given by a health care professional in a clinic, pharmacy, doctor's office, or other health care setting. You will not be given vaccine doses to store at home. NOTE: This sheet is a summary. It may not cover all possible information. If you have questions about this medicine, talk to your doctor, pharmacist, or health care  provider.  2018 Elsevier/Gold Standard (2015-05-23 10:07:28)          Home Oxygen Use, Adult When a medical condition keeps you from getting enough oxygen, your health care provider may instruct you to take extra oxygen at home. Your health care provider will let you know:  When to take oxygen.  For how long to take oxygen.  How quickly oxygen should be delivered (flow rate), in liters per minute (LPM or L/M).  Home oxygen can be given through:  A mask.  A nasal cannula. This is a device or tube that goes in the nostrils.  A transtracheal catheter. This is a small, flexible tube placed in the trachea.  A tracheostomy. This is a surgically made opening in the trachea.  These devices are connected with tubing to an oxygen source, such as:  A tank. Tanks hold oxygen in gas form. They must be replaced when the oxygen is used up.  A liquid oxygen device. This holds oxygen in liquid form. It must be replaced when the oxygen is used up.  An oxygen concentrator machine. This filters oxygen in the room. It uses electricity, so you must have a backup cylinder of oxygen in case the power goes out.  Supplies needed: To use oxygen, you will need:  A mask, nasal cannula, transtracheal catheter, or tracheostomy.  An  oxygen tank, a liquid oxygen device, or an oxygen concentrator.  The tape that your health care provider recommends (optional).  If you use a transtracheal catheter and your prescribed flow rate is 1 LPM or greater, you will also need a humidifier. Risks and complications  Fire. This can happen if the oxygen is exposed to a heat source, flame, or spark.  Injury to skin. This can happen if liquid oxygen touches your skin.  Organ damage. This can happen if you get too little oxygen. How to use oxygen Your health care provider will show you how to use your oxygen device. Follow her or his instructions. They may look something like this: 1. Wash your hands. 2. If  you use an oxygen concentrator, make sure it is plugged in. 3. Place one end of the tube into the port on the tank, device, or machine. 4. Place the mask over your nose and mouth. Or, place the nasal cannula and secure it with tape if instructed. If you use a tracheostomy or transtracheal catheter, connect it to the oxygen source as directed. 5. Make sure the liter-flow setting on the machine is at the level prescribed by your health care provider. 6. Turn on the machine or adjust the knob on the tank or device to the correct liter-flow setting. 7. When you are done, turn off and unplug the machine, or turn the knob to OFF.  How to clean and care for the oxygen supplies Nasal cannula  Clean it with a warm, wet cloth daily or as needed.  Wash it with a liquid soap once a week.  Rinse it thoroughly once or twice a week.  Replace it every 2-4 weeks.  If you have an infection, such as a cold or pneumonia, change the cannula when you get better. Mask  Replace it every 2-4 weeks.  If you have an infection, such as a cold or pneumonia, change the mask when you get better. Humidifier bottle  Wash the bottle between each refill: ? Wash it with soap and warm water. ? Rinse it thoroughly. ? Disinfect it and its top. ? Air-dry it.  Make sure it is dry before you refill it. Oxygen concentrator  Clean the air filter at least twice a week according to directions from your home medical equipment and service company.  Wipe down the cabinet every day. To do this: ? Unplug the unit. ? Wipe down the cabinet with a damp cloth. ? Dry the cabinet. Other equipment  Change any extra tubing every 1-3 months.  Follow instructions from your health care provider about taking care of any other equipment. Safety tips Fire safety tips   Keep your oxygen and oxygen supplies at least 5 ft away from sources of heat, flames, and sparks at all times.  Do not allow smoking near your oxygen. Put up "no  smoking" signs in your home.  Do not use materials that can burn (are flammable) while you use oxygen.  When you go to a restaurant with portable oxygen, ask to be seated in the nonsmoking section.  Keep a Data processing manager close by. Let your fire department know that you have oxygen in your home.  Test your home smoke detectors regularly. General safety tips  If you use an oxygen cylinder, make sure it is in a stand or secured to an object that will not move (fixed object).  If you use liquid oxygen, make sure its container is kept upright.  If you use an  oxygen concentrator: ? Consulting civil engineer. Make sure you are given priority service in the event that your power goes out. ? Avoid using extension cords, if possible. Follow these instructions at home:  Use oxygen only as told by your health care provider.  Do not use alcohol or other drugs that make you relax (sedating drugs) unless instructed. They can slow down your breathing rate and make it hard to get in enough oxygen.  Know how and when to order a refill of oxygen.  Always keep a spare tank of oxygen. Plan ahead for holidays when you may not be able to get a prescription filled.  Use water-based lubricants on your lips or nostrils. Do not use oil-based products like petroleum jelly.  To prevent skin irritation on your cheeks or behind your ears, tuck some gauze under the tubing. Contact a health care provider if:  You get headaches often.  You have shortness of breath.  You have a lasting cough.  You have anxiety.  You are sleepy all the time.  You develop an illness that affects your breathing.  You cannot exercise at your regular level.  You are restless.  You have difficult or irregular breathing, and it is getting worse.  You have a fever.  You have persistent redness under your nose. Get help right away if:  You are confused.  You have blue lips or fingernails.  You are struggling to  breathe. This information is not intended to replace advice given to you by your health care provider. Make sure you discuss any questions you have with your health care provider. Document Released: 01/22/2004 Document Revised: 06/30/2016 Document Reviewed: 05/25/2016 Elsevier Interactive Patient Education  [employer].

## 2018-09-04 NOTE — Progress Notes (Deleted)
@Patient  ID: Teresa Roberts, female    DOB: 21-Feb-1961, 57 y.o.   MRN: [id number]  No chief complaint on file.   Referring provider: Jonathon Jordan, MD  HPI:  57 year old former smoker followed in our office for COPD, chronic respiratory failure, obstructive sleep apnea  PMH: Hypertension, hyperlipidemia, diastolic CHF, CAD Smoker/ Smoking History: Former Smoker. 20 pack year history. Quit 03/2018.  Maintenance:  Trelegy Ellipta Pt of: Dr. Elsworth Soho   Recent [facility] Encounters:   Teresa Roberts is a 57 yo F w/ PMH of HTN, HLD, Diastolic CHF, CAD presenting to the clinic for continuing management of her COPD. Since her last visit, she has had 2 admissions for shortness of breath, with the most recent admission in 05/27/2018. She was worked up for ACS, found to have some pulmonary edema and discharged once symptoms improved with Lasix. She states since discharge she has had no additional dyspneic episodes. She is continuing to Lasix 40mg  po daily as prescribed.   For her COPD, she is currently on 4L home oxygen and Trelegy for maintenance and she was told to pick up duoneb nebulizer. She picked up the nebulizer machine but she has yet to pick up the nebulizer solution. She has quit smoking. She has no Albuterol inhaler at home. Denies any F/N/V/D/C. No cough, wheezing, chest pain, palpitations.  Smoking history: ~20 pack year smoking history with 1/2pack a day for 40 years. Currently have been tobacco free for last 2 months.  09/04/2018  - Visit   HPI    Tests:    FENO:  No results found for: NITRICOXIDE  PFT: No flowsheet data found.  Imaging: Dg Elbow Complete Left  Result Date: 08/29/2018 CLINICAL DATA:  Pain after fall EXAM: LEFT ELBOW - COMPLETE 3+ VIEW COMPARISON:  None. FINDINGS: Possible subtle step-off in the radial head on the AP view. Degenerative changes. No other bony abnormalities. Joint effusion with a displaced anterior fat pad and visualization of the posterior  fat pad. IMPRESSION: The joint effusion suggests a fracture. While no definitive fractures are noted. There may be a slight step-off in the radial head on one view suggesting the possibility of a nondisplaced subtle radial head fracture. Electronically Signed   By: Dorise Bullion III M.D   On: 08/29/2018 09:08   Dg Forearm Left  Result Date: 08/29/2018 CLINICAL DATA:  Pain after fall. EXAM: LEFT FOREARM - 2 VIEW COMPARISON:  None FINDINGS: Degenerative changes in the elbow. A posterior fat pad is identified. No obvious fracture noted. IMPRESSION: Joint effusion with a posterior fat pad sign in the elbow suggest an occult fracture. No definitive fracture noted. Electronically Signed   By: Dorise Bullion III M.D   On: 08/29/2018 09:06    Chart Review:    Specialty Problems      Pulmonary Problems   Shortness of breath   Hypoxia   Respiratory failure with hypoxia (HCC)   COPD (chronic obstructive pulmonary disease) (HCC)    FEV1 52% - 02/2017      OSA (obstructive sleep apnea)      Allergies  Allergen Reactions  . Citrus Hives  . Lisinopril Cough    Immunization History  Administered Date(s) Administered  . Influenza Split 08/15/2017  . Influenza-Unspecified 08/15/2016  . Pneumococcal-Unspecified 02/13/2013    Past Medical History:  Diagnosis Date  . CAD (coronary artery disease)    a. 01/2017: cath showing 20% mid RCA stenosis and otherwise normal LAD and LCx with a preserved EF of 55 to  60%; mod pulmonary HTN  . COPD (chronic obstructive pulmonary disease) ([place])    O2 dependent  . cpap   . Depression    PTSD  . Diastolic CHF ([place])   . Fibromyalgia   . Hyperlipidemia   . Hypertension   . Migraines     Tobacco History: Social History   Tobacco Use  Smoking Status Former Smoker  . Packs/day: 1.00  . Years: 39.00  . Pack years: 39.00  . Types: Cigarettes  . Start date: 07/21/2018  Smokeless Tobacco Never Used  Tobacco Comment   resumed smoking less than a  pack a day   Counseling given: Not Answered Comment: resumed smoking less than a pack a day   Outpatient Encounter Medications as of 09/04/2018  Medication Sig  . albuterol (PROVENTIL) (2.5 MG/3ML) 0.083% nebulizer solution Take 3 mLs (2.5 mg total) by nebulization every 6 (six) hours as needed for wheezing or shortness of breath.  Marland Kitchen amLODipine (NORVASC) 2.5 MG tablet Take 2.5 mg by mouth daily.  Marland Kitchen aspirin 81 MG EC tablet Take 81 mg by mouth daily after lunch.  . busPIRone (BUSPAR) 15 MG tablet Take 15 mg by mouth 2 (two) times daily.  Marland Kitchen docusate sodium (COLACE) 100 MG capsule Take 100 mg by mouth 3 (three) times daily.  . DULoxetine (CYMBALTA) 30 MG capsule Take 30 mg by mouth daily.  . Fluticasone-Umeclidin-Vilant (TRELEGY ELLIPTA) 100-62.5-25 MCG/INH AEPB Inhale 1 puff into the lungs daily.  . folic acid (FOLVITE) 1 MG tablet Take 1 mg by mouth daily after lunch.  . furosemide (LASIX) 40 MG tablet Take 1 tablet (40 mg total) by mouth daily.  Marland Kitchen gabapentin (NEURONTIN) 800 MG tablet Take 800 mg by mouth 2 (two) times daily.  Marland Kitchen ipratropium-albuterol (DUONEB) 0.5-2.5 (3) MG/3ML SOLN Take 3 mLs by nebulization every 6 (six) hours as needed (Shortness of breath).  . latanoprost (XALATAN) 0.005 % ophthalmic solution Place 1 drop into both eyes at bedtime.  . metaxalone (SKELAXIN) 800 MG tablet TAKE 1 TABLET (800 MG TOTAL) BY MOUTH 3 (THREE) TIMES DAILY AS NEEDED FOR MUSCLE SPASMS.  . modafinil (PROVIGIL) 100 MG tablet Take 100 mg by mouth daily.  . Multiple Vitamins-Minerals (HAIR/SKIN/NAILS/BIOTIN) TABS Take 1 tablet by mouth daily after lunch.  Marland Kitchen omeprazole (PRILOSEC) 20 MG capsule Take by mouth daily.  . OXYGEN Inhale 4 L into the lungs continuous.  . potassium chloride 20 MEQ TBCR Take 20 mEq by mouth daily.  . rizatriptan (MAXALT-MLT) 10 MG disintegrating tablet Take 10 mg by mouth as needed for migraine. May repeat in 2 hours if needed  . simvastatin (ZOCOR) 10 MG tablet Take 10 mg by  mouth at bedtime.  Marland Kitchen tiZANidine (ZANAFLEX) 4 MG tablet TAKE 1 TABLET (4 MG TOTAL) BY MOUTH AT BEDTIME AS NEEDED FOR MUSCLE SPASMS.  Marland Kitchen topiramate (TOPAMAX) 25 MG tablet Take 50 mg by mouth daily.   . traZODone (DESYREL) 100 MG tablet Take 100 mg by mouth at bedtime.   . vitamin B-12 (CYANOCOBALAMIN) 1000 MCG tablet Take 1,000 mcg by mouth daily after lunch.   No facility-administered encounter medications on file as of 09/04/2018.      Review of Systems  Review of Systems   Physical Exam  LMP 08/19/2012   Wt Readings from Last 5 Encounters:  08/24/18 213 lb 10 oz (96.9 kg)  08/15/18 210 lb 1.6 oz (95.3 kg)  07/28/18 210 lb 5.1 oz (95.4 kg)  07/19/18 200 lb (90.7 kg)  07/11/18 206 lb  6.4 oz (93.6 kg)     Physical Exam    Lab Results:  CBC    Component Value Date/Time   WBC 7.4 07/12/2018 1436   WBC 7.9 05/27/2018 1618   RBC 6.14 (H) 07/12/2018 1436   RBC 5.23 (H) 05/27/2018 1618   HGB 12.7 07/12/2018 1436   HGB 13.6 09/30/2017 0954   HCT 44.0 07/12/2018 1436   HCT 44.8 09/30/2017 0954   PLT 206 07/12/2018 1436   MCV 72 (L) 07/12/2018 1436   MCV 76.8 (L) 09/30/2017 0954   MCH 20.7 (L) 07/12/2018 1436   MCH 20.5 (L) 05/27/2018 1618   MCHC 28.9 (L) 07/12/2018 1436   MCHC 28.2 (L) 05/27/2018 1618   RDW 22.2 (H) 07/12/2018 1436   RDW 29.2 (H) 09/30/2017 0954   LYMPHSABS 1.3 09/30/2017 0954   MONOABS 0.4 09/30/2017 0954   EOSABS 0.2 09/30/2017 0954   BASOSABS 0.1 09/30/2017 0954    BMET    Component Value Date/Time   NA 143 07/12/2018 1436   NA 141 09/30/2017 0954   K 4.1 07/12/2018 1436   K 3.7 09/30/2017 0954   CL 99 07/12/2018 1436   CO2 24 07/12/2018 1436   CO2 24 09/30/2017 0954   GLUCOSE 86 07/12/2018 1436   GLUCOSE 93 05/29/2018 0639   GLUCOSE 96 09/30/2017 0954   BUN 17 07/12/2018 1436   BUN 8.8 09/30/2017 0954   CREATININE 1.24 (H) 07/12/2018 1436   CREATININE 0.8 09/30/2017 0954   CALCIUM 10.9 (H) 07/12/2018 1436   CALCIUM 10.4 09/30/2017  0954   GFRNONAA 49 (L) 07/12/2018 1436   GFRAA 56 (L) 07/12/2018 1436    BNP    Component Value Date/Time   BNP 510.0 (H) 05/27/2018 2347    ProBNP No results found for: PROBNP    Assessment & Plan:     No problem-specific Assessment & Plan notes found for this encounter.     Lauraine Rinne, NP 09/04/2018

## 2018-09-04 NOTE — Assessment & Plan Note (Signed)
Continue Trelegy Ellipta  >>> 1 puff daily in the morning >>>rinse mouth out after use  >>> This inhaler contains 3 medications that help manage her respiratory status, contact our office if you cannot afford this medication or unable to remain on this medication >>> Samples provided today   New order for oxygen as well as o2 oxymiser  >>> [facility] >>>Patient is switching from [place] >>>Patient will also need concentrator to go up to 10 L >>>6L at rest  >>>8L with exertion, based on Pulm Rehab   Keep follow-up with our office   Regular flu vaccine today

## 2018-09-04 NOTE — Progress Notes (Signed)
@Patient  ID: Teresa Roberts, female    DOB: [birth year], 57 y.o.   MRN: [id number]  Chief Complaint  Patient presents with  . Follow-up    02 qualification per [facility]    Referring provider: Jonathon Jordan, MD  HPI:  57 year old former smoker followed in our office for COPD, chronic respiratory failure, obstructive sleep apnea  PMH: Hypertension, hyperlipidemia, diastolic CHF, CAD Smoker/ Smoking History: Former Smoker. 20 pack year history. Quit 03/2018.  Maintenance:  Trelegy Ellipta Pt of: Dr. Elsworth Soho   09/04/2018  - Visit   57 year old female presenting today for office visit to qualify for oxygen for [facility].  Patient currently receives oxygen via the [place].  Patient is switching DME companies to [facility].  Patient is currently in pulmonary rehab and requires 8 L of oxygen during pulmonary rehab.  On arrival to office today patient also is on 4 L continuous and oxygen saturations are 62%, patient was placed on 6 L continuous and came up to 93%.  Patient arrived using her POC at 5 L pulsed.  Patient reports that spent a long day she is had multiple appointments and is just tired.  Patient reports that at home when she is on 5 L continuous oxygen saturations remain around 89 to 90%.  Patient would like flu vaccine today.     Tests:    FENO:  No results found for: NITRICOXIDE  PFT: No flowsheet data found.  Imaging: Dg Elbow Complete Left  Result Date: 08/29/2018 CLINICAL DATA:  Pain after fall EXAM: LEFT ELBOW - COMPLETE 3+ VIEW COMPARISON:  None. FINDINGS: Possible subtle step-off in the radial head on the AP view. Degenerative changes. No other bony abnormalities. Joint effusion with a displaced anterior fat pad and visualization of the posterior fat pad. IMPRESSION: The joint effusion suggests a fracture. While no definitive fractures are noted. There may be a slight step-off in the radial head on one view suggesting the possibility of a nondisplaced subtle radial  head fracture. Electronically Signed   By: Dorise Bullion III M.D   On: 08/29/2018 09:08   Dg Forearm Left  Result Date: 08/29/2018 CLINICAL DATA:  Pain after fall. EXAM: LEFT FOREARM - 2 VIEW COMPARISON:  None FINDINGS: Degenerative changes in the elbow. A posterior fat pad is identified. No obvious fracture noted. IMPRESSION: Joint effusion with a posterior fat pad sign in the elbow suggest an occult fracture. No definitive fracture noted. Electronically Signed   By: Dorise Bullion III M.D   On: 08/29/2018 09:06    Chart Review:    Specialty Problems      Pulmonary Problems   Shortness of breath   Hypoxia   Respiratory failure with hypoxia (HCC)   COPD (chronic obstructive pulmonary disease) (HCC)    FEV1 52% - 02/2017      OSA (obstructive sleep apnea)      Allergies  Allergen Reactions  . Citrus Hives  . Lisinopril Cough    Immunization History  Administered Date(s) Administered  . Influenza Split 08/15/2017  . Influenza,inj,Quad PF,6+ Mos 09/04/2018  . Influenza-Unspecified 08/15/2016  . Pneumococcal-Unspecified 02/13/2013   >>>Flu vaccine today   Past Medical History:  Diagnosis Date  . CAD (coronary artery disease)    a. 01/2017: cath showing 20% mid RCA stenosis and otherwise normal LAD and LCx with a preserved EF of 55 to 60%; mod pulmonary HTN  . COPD (chronic obstructive pulmonary disease) ([place])    O2 dependent  . cpap   .  Depression    PTSD  . Diastolic CHF ([place])   . Fibromyalgia   . Hyperlipidemia   . Hypertension   . Migraines     Tobacco History: Social History   Tobacco Use  Smoking Status Former Smoker  . Packs/day: 1.00  . Years: 39.00  . Pack years: 39.00  . Types: Cigarettes  . Start date: 07/21/2018  Smokeless Tobacco Never Used  Tobacco Comment   resumed smoking less than a pack a day   Counseling given: Yes Comment: resumed smoking less than a pack a day  >>> Continue not smoking  Outpatient Encounter Medications as of  09/04/2018  Medication Sig  . albuterol (PROVENTIL) (2.5 MG/3ML) 0.083% nebulizer solution Take 3 mLs (2.5 mg total) by nebulization every 6 (six) hours as needed for wheezing or shortness of breath.  Marland Kitchen amLODipine (NORVASC) 2.5 MG tablet Take 2.5 mg by mouth daily.  Marland Kitchen aspirin 81 MG EC tablet Take 81 mg by mouth daily after lunch.  . busPIRone (BUSPAR) 15 MG tablet Take 15 mg by mouth 2 (two) times daily.  Marland Kitchen docusate sodium (COLACE) 100 MG capsule Take 100 mg by mouth 3 (three) times daily.  . DULoxetine (CYMBALTA) 30 MG capsule Take 30 mg by mouth daily.  . Fluticasone-Umeclidin-Vilant (TRELEGY ELLIPTA) 100-62.5-25 MCG/INH AEPB Inhale 1 puff into the lungs daily.  . folic acid (FOLVITE) 1 MG tablet Take 1 mg by mouth daily after lunch.  . furosemide (LASIX) 40 MG tablet Take 1 tablet (40 mg total) by mouth daily.  Marland Kitchen gabapentin (NEURONTIN) 800 MG tablet Take 800 mg by mouth 2 (two) times daily.  Marland Kitchen ipratropium-albuterol (DUONEB) 0.5-2.5 (3) MG/3ML SOLN Take 3 mLs by nebulization every 6 (six) hours as needed (Shortness of breath).  . latanoprost (XALATAN) 0.005 % ophthalmic solution Place 1 drop into both eyes at bedtime.  . metaxalone (SKELAXIN) 800 MG tablet TAKE 1 TABLET (800 MG TOTAL) BY MOUTH 3 (THREE) TIMES DAILY AS NEEDED FOR MUSCLE SPASMS.  . modafinil (PROVIGIL) 100 MG tablet Take 100 mg by mouth daily.  . Multiple Vitamins-Minerals (HAIR/SKIN/NAILS/BIOTIN) TABS Take 1 tablet by mouth daily after lunch.  Marland Kitchen omeprazole (PRILOSEC) 20 MG capsule Take by mouth daily.  . OXYGEN Inhale 4 L into the lungs continuous.  . potassium chloride 20 MEQ TBCR Take 20 mEq by mouth daily.  . rizatriptan (MAXALT-MLT) 10 MG disintegrating tablet Take 10 mg by mouth as needed for migraine. May repeat in 2 hours if needed  . simvastatin (ZOCOR) 10 MG tablet Take 10 mg by mouth at bedtime.  Marland Kitchen tiZANidine (ZANAFLEX) 4 MG tablet TAKE 1 TABLET (4 MG TOTAL) BY MOUTH AT BEDTIME AS NEEDED FOR MUSCLE SPASMS.  Marland Kitchen  topiramate (TOPAMAX) 25 MG tablet Take 50 mg by mouth daily.   . traZODone (DESYREL) 100 MG tablet Take 100 mg by mouth at bedtime.   . vitamin B-12 (CYANOCOBALAMIN) 1000 MCG tablet Take 1,000 mcg by mouth daily after lunch.  . Fluticasone-Umeclidin-Vilant (TRELEGY ELLIPTA) 100-62.5-25 MCG/INH AEPB Inhale 1 puff into the lungs daily.   No facility-administered encounter medications on file as of 09/04/2018.      Review of Systems  Review of Systems  Constitutional: Positive for fatigue. Negative for chills, fever and unexpected weight change.  HENT: Positive for congestion. Negative for ear pain, postnasal drip, sinus pressure and sinus pain.   Respiratory: Positive for shortness of breath. Negative for cough, chest tightness and wheezing.   Cardiovascular: Negative for chest pain and palpitations.  Gastrointestinal: Negative for blood in stool, diarrhea, nausea and vomiting.  Genitourinary: Negative for dysuria, frequency and urgency.  Musculoskeletal: Negative for arthralgias.  Skin: Negative for color change.  Allergic/Immunologic: Negative for environmental allergies and food allergies.  Neurological: Negative for dizziness, light-headedness and headaches.  Psychiatric/Behavioral: Negative for dysphoric mood. The patient is not nervous/anxious.   All other systems reviewed and are negative.    Physical Exam  BP 114/70 (BP Location: Right Arm, Cuff Size: Normal)   Pulse 82   Ht 5\' 6"  (1.676 m)   Wt 215 lb 3.2 oz (97.6 kg)   LMP 08/19/2012   SpO2 93%   BMI 34.73 kg/m   Wt Readings from Last 5 Encounters:  09/04/18 215 lb 3.2 oz (97.6 kg)  08/24/18 213 lb 10 oz (96.9 kg)  08/15/18 210 lb 1.6 oz (95.3 kg)  07/28/18 210 lb 5.1 oz (95.4 kg)  07/19/18 200 lb (90.7 kg)     Physical Exam  Constitutional: She is oriented to person, place, and time and well-developed, well-nourished, and in no distress. No distress.  HENT:  Head: Normocephalic and atraumatic.  Right Ear:  Hearing, external ear and ear canal normal.  Left Ear: Hearing, external ear and ear canal normal.  Nose: Mucosal edema present. Right sinus exhibits no maxillary sinus tenderness and no frontal sinus tenderness. Left sinus exhibits no maxillary sinus tenderness and no frontal sinus tenderness.  Mouth/Throat: Uvula is midline and oropharynx is clear and moist. No oropharyngeal exudate.  +TM with effusion without infection, +PND  Eyes: Pupils are equal, round, and reactive to light.  Neck: Normal range of motion. Neck supple. No JVD present.  Cardiovascular: Normal rate, regular rhythm and normal heart sounds.  Pulmonary/Chest: Effort normal and breath sounds normal. No accessory muscle usage. No respiratory distress. She has no decreased breath sounds. She has no wheezes. She has no rhonchi.  Musculoskeletal: Normal range of motion. She exhibits no edema.  Lymphadenopathy:    She has no cervical adenopathy.  Neurological: She is alert and oriented to person, place, and time. Gait normal.  Skin: Skin is warm and dry. She is not diaphoretic. No erythema.  Psychiatric: Mood, memory, affect and judgment normal.  Nursing note and vitals reviewed.     Lab Results:  CBC    Component Value Date/Time   WBC 7.4 07/12/2018 1436   WBC 7.9 05/27/2018 1618   RBC 6.14 (H) 07/12/2018 1436   RBC 5.23 (H) 05/27/2018 1618   HGB 12.7 07/12/2018 1436   HGB 13.6 09/30/2017 0954   HCT 44.0 07/12/2018 1436   HCT 44.8 09/30/2017 0954   PLT 206 07/12/2018 1436   MCV 72 (L) 07/12/2018 1436   MCV 76.8 (L) 09/30/2017 0954   MCH 20.7 (L) 07/12/2018 1436   MCH 20.5 (L) 05/27/2018 1618   MCHC 28.9 (L) 07/12/2018 1436   MCHC 28.2 (L) 05/27/2018 1618   RDW 22.2 (H) 07/12/2018 1436   RDW 29.2 (H) 09/30/2017 0954   LYMPHSABS 1.3 09/30/2017 0954   MONOABS 0.4 09/30/2017 0954   EOSABS 0.2 09/30/2017 0954   BASOSABS 0.1 09/30/2017 0954    BMET    Component Value Date/Time   NA 143 07/12/2018 1436   NA  141 09/30/2017 0954   K 4.1 07/12/2018 1436   K 3.7 09/30/2017 0954   CL 99 07/12/2018 1436   CO2 24 07/12/2018 1436   CO2 24 09/30/2017 0954   GLUCOSE 86 07/12/2018 1436   GLUCOSE 93 05/29/2018 6301  GLUCOSE 96 09/30/2017 0954   BUN 17 07/12/2018 1436   BUN 8.8 09/30/2017 0954   CREATININE 1.24 (H) 07/12/2018 1436   CREATININE 0.8 09/30/2017 0954   CALCIUM 10.9 (H) 07/12/2018 1436   CALCIUM 10.4 09/30/2017 0954   GFRNONAA 49 (L) 07/12/2018 1436   GFRAA 56 (L) 07/12/2018 1436    BNP    Component Value Date/Time   BNP 510.0 (H) 05/27/2018 2347    ProBNP No results found for: PROBNP    Assessment & Plan:   57 year old female patient presenting today for follow-up visit.  Will get patient continued on oxygen with [facility].  We will send order as well as for pulse Oxymizer.  Patient to continue pulmonary rehab.  Patient to keep follow-up with our office.  Patient refused to be discharged on 6 L continuous oxygen.  Patient was walked to her car where then she was placed on her POC at 5 L pulse.  Patient reports she will contact our office when she is home safely.  Patient was educated multiple times that she should be discharged on 6 L continuous oxygen in order to maintain her oxygen saturations greater than 88%.  Flu vaccine today  COPD (chronic obstructive pulmonary disease) (HCC) Continue Trelegy Ellipta  >>> 1 puff daily in the morning >>>rinse mouth out after use  >>> This inhaler contains 3 medications that help manage her respiratory status, contact our office if you cannot afford this medication or unable to remain on this medication >>> Samples provided today   New order for oxygen as well as o2 oxymiser  >>> Order placed to [facility] >>>Patient is switching from [place] >>>Patient will also need concentrator to go up to 10 L >>>6L at rest  >>>8L with exertion, based on Pulm Rehab   Keep follow-up with our office   Regular flu vaccine  today   Respiratory failure with hypoxia ([place]) New order for oxygen as well as o2 oxymiser  >>> Order placed to [facility] >>>Patient is switching from [place] >>>Patient will also need concentrator to go up to 10 L >>>6L at rest  >>>8L with exertion, based on Pulm Rehab   Keep follow-up with our office   Regular flu vaccine today   This appointment was 29 minutes along with her 50% that time in direct face-to-face patient care, assessment, plan of care follow-up.   Lauraine Rinne, NP 09/04/2018

## 2018-09-05 ENCOUNTER — Ambulatory Visit (INDEPENDENT_AMBULATORY_CARE_PROVIDER_SITE_OTHER): Payer: 59 | Admitting: Cardiology

## 2018-09-05 ENCOUNTER — Other Ambulatory Visit: Payer: Self-pay | Admitting: Pulmonary Disease

## 2018-09-05 ENCOUNTER — Encounter: Payer: Self-pay | Admitting: Cardiology

## 2018-09-05 ENCOUNTER — Encounter (HOSPITAL_COMMUNITY): Payer: 59

## 2018-09-05 VITALS — BP 104/60 | HR 93 | Ht 66.0 in | Wt 209.8 lb

## 2018-09-05 DIAGNOSIS — J449 Chronic obstructive pulmonary disease, unspecified: Secondary | ICD-10-CM | POA: Diagnosis not present

## 2018-09-05 DIAGNOSIS — I5032 Chronic diastolic (congestive) heart failure: Secondary | ICD-10-CM

## 2018-09-05 DIAGNOSIS — J9611 Chronic respiratory failure with hypoxia: Secondary | ICD-10-CM

## 2018-09-05 DIAGNOSIS — R55 Syncope and collapse: Secondary | ICD-10-CM

## 2018-09-05 DIAGNOSIS — I10 Essential (primary) hypertension: Secondary | ICD-10-CM | POA: Diagnosis not present

## 2018-09-05 DIAGNOSIS — G4733 Obstructive sleep apnea (adult) (pediatric): Secondary | ICD-10-CM

## 2018-09-05 DIAGNOSIS — J441 Chronic obstructive pulmonary disease with (acute) exacerbation: Secondary | ICD-10-CM | POA: Diagnosis not present

## 2018-09-05 DIAGNOSIS — I272 Pulmonary hypertension, unspecified: Secondary | ICD-10-CM

## 2018-09-05 DIAGNOSIS — I2510 Atherosclerotic heart disease of native coronary artery without angina pectoris: Secondary | ICD-10-CM

## 2018-09-05 NOTE — Patient Instructions (Signed)

## 2018-09-05 NOTE — Progress Notes (Signed)
Cardiology Office Note:    Date:  09/05/2018   ID:  Shaquoia Miers, DOB [birth year], MRN [id number]  PCP:  Jonathon Jordan, MD  Cardiologist:  Buford Dresser, MD PhD  Referring MD: Jonathon Jordan, MD   CC: Shortness of breath, follow up  History of Present Illness:    Teresa Roberts is a 57 y.o. female with a hx of fibromyalgia, hypertension, COPD, chronic diastolic heart failure, nononbstructive CAD who is seen in follow up at the request of Dr. Stephanie Acre for evaluation and management of shortness of breath. She was initially seen on 07/11/18.   Patient concerns today: Had a fall last week. Was walking to her mailbox; on her way back, nearly at her porch, she passed out. She was not wearing oxygen at the time. She fractured her elbow as a result of the fall. She feels this is gradually improving. She was seen by pulmonary yesterday for home O2. Using her CPAP nightly. No PND or orthopnea. No LE edema.   We reviewed the results of the right heart cath today as well. Overall she feels that things are stable. Denies recent chest pain, breathing is always short but unchanged for her.   Past Medical History:  Diagnosis Date  . CAD (coronary artery disease)    a. 01/2017: cath showing 20% mid RCA stenosis and otherwise normal LAD and LCx with a preserved EF of 55 to 60%; mod pulmonary HTN  . COPD (chronic obstructive pulmonary disease) ([place])    O2 dependent  . cpap   . Depression    PTSD  . Diastolic CHF ([place])   . Fibromyalgia   . Hyperlipidemia   . Hypertension   . Migraines     Past Surgical History:  Procedure Laterality Date  . APPENDECTOMY    . CESAREAN SECTION    . ORIF ANKLE FRACTURE Left 08/12/2017   Procedure: OPEN REDUCTION INTERNAL FIXATION (ORIF) ANKLE FRACTURE;  Surgeon: Rod Can, MD;  Location: [facility];  Service: Orthopedics;  Laterality: Left;  . RIGHT HEART CATH N/A 07/19/2018   Procedure: RIGHT HEART CATH;  Surgeon: Jolaine Artist, MD;  Location: [facility];  Service: Cardiovascular;  Laterality: N/A;  . RIGHT/LEFT HEART CATH AND CORONARY ANGIOGRAPHY N/A 01/27/2017   Procedure: Right/Left Heart Cath and Coronary Angiography;  Surgeon: Troy Sine, MD;  Location: [facility];  Service: Cardiovascular;  Laterality: N/A;    Current Medications: Current Outpatient Medications on File Prior to Visit  Medication Sig  . albuterol (PROVENTIL) (2.5 MG/3ML) 0.083% nebulizer solution Take 3 mLs (2.5 mg total) by nebulization every 6 (six) hours as needed for wheezing or shortness of breath.  Marland Kitchen amLODipine (NORVASC) 2.5 MG tablet Take 2.5 mg by mouth daily.  Marland Kitchen aspirin 81 MG EC tablet Take 81 mg by mouth daily after lunch.  . busPIRone (BUSPAR) 15 MG tablet Take 15 mg by mouth 2 (two) times daily.  Marland Kitchen docusate sodium (COLACE) 100 MG capsule Take 100 mg by mouth 3 (three) times daily.  . DULoxetine (CYMBALTA) 30 MG capsule Take 30 mg by mouth daily.  . Fluticasone-Umeclidin-Vilant (TRELEGY ELLIPTA) 100-62.5-25 MCG/INH AEPB Inhale 1 puff into the lungs daily.  . Fluticasone-Umeclidin-Vilant (TRELEGY ELLIPTA) 100-62.5-25 MCG/INH AEPB Inhale 1 puff into the lungs daily.  . folic acid (FOLVITE) 1 MG tablet Take 1 mg by mouth daily after lunch.  . furosemide (LASIX) 40 MG tablet Take 1 tablet (40 mg total) by mouth daily.  Marland Kitchen gabapentin (NEURONTIN) 800 MG  tablet Take 800 mg by mouth 2 (two) times daily.  Marland Kitchen ipratropium-albuterol (DUONEB) 0.5-2.5 (3) MG/3ML SOLN Take 3 mLs by nebulization every 6 (six) hours as needed (Shortness of breath).  . latanoprost (XALATAN) 0.005 % ophthalmic solution Place 1 drop into both eyes at bedtime.  . metaxalone (SKELAXIN) 800 MG tablet TAKE 1 TABLET (800 MG TOTAL) BY MOUTH 3 (THREE) TIMES DAILY AS NEEDED FOR MUSCLE SPASMS.  . modafinil (PROVIGIL) 100 MG tablet Take 100 mg by mouth daily.  . Multiple Vitamins-Minerals (HAIR/SKIN/NAILS/BIOTIN) TABS Take 1 tablet by mouth daily after lunch.  Marland Kitchen omeprazole  (PRILOSEC) 20 MG capsule Take by mouth daily.  . OXYGEN Inhale 4 L into the lungs continuous.  . potassium chloride 20 MEQ TBCR Take 20 mEq by mouth daily.  . rizatriptan (MAXALT-MLT) 10 MG disintegrating tablet Take 10 mg by mouth as needed for migraine. May repeat in 2 hours if needed  . simvastatin (ZOCOR) 10 MG tablet Take 10 mg by mouth at bedtime.  Marland Kitchen tiZANidine (ZANAFLEX) 4 MG tablet TAKE 1 TABLET (4 MG TOTAL) BY MOUTH AT BEDTIME AS NEEDED FOR MUSCLE SPASMS.  Marland Kitchen topiramate (TOPAMAX) 25 MG tablet Take 50 mg by mouth daily.   . traZODone (DESYREL) 100 MG tablet Take 100 mg by mouth at bedtime.   . vitamin B-12 (CYANOCOBALAMIN) 1000 MCG tablet Take 1,000 mcg by mouth daily after lunch.   No current facility-administered medications on file prior to visit.     Cardiac medications: furosemide 40 mg daily, aspirin 81 mg daily, amlodipine 2.5 mg daily, simvastatin 10 mg daily.  Allergies:   Citrus and Lisinopril   Social History   Socioeconomic History  . Marital status: Married    Spouse name: Not on file  . Number of children: Not on file  . Years of education: Not on file  . Highest education level: Not on file  Occupational History  . Not on file  Social Needs  . Financial resource strain: Not on file  . Food insecurity:    Worry: Not on file    Inability: Not on file  . Transportation needs:    Medical: Not on file    Non-medical: Not on file  Tobacco Use  . Smoking status: Former Smoker    Packs/day: 1.00    Years: 39.00    Pack years: 39.00    Types: Cigarettes    Start date: 07/21/2018  . Smokeless tobacco: Never Used  . Tobacco comment: resumed smoking less than a pack a day  Substance and Sexual Activity  . Alcohol use: No    Frequency: Never  . Drug use: No  . Sexual activity: Not on file  Lifestyle  . Physical activity:    Days per week: Not on file    Minutes per session: Not on file  . Stress: Not on file  Relationships  . Social connections:    Talks  on phone: Not on file    Gets together: Not on file    Attends religious service: Not on file    Active member of club or organization: Not on file    Attends meetings of clubs or organizations: Not on file    Relationship status: Not on file  Other Topics Concern  . Not on file  Social History Narrative  . Not on file     Family History: The patient's family history includes CAD in her mother; Diabetes in her father, mother, and sister; Hypertension in her mother; Lung  cancer in her father. No family history of use of oxygen, no heart failure.  ROS:   Please see the history of present illness.  Additional pertinent ROS: Review of Systems  Constitutional: Negative for chills and fever.       Positive for sweating  HENT: Negative for ear pain and hearing loss.   Eyes: Negative for blurred vision and pain.  Respiratory: Positive for shortness of breath. Negative for cough, hemoptysis and wheezing.   Cardiovascular: Negative for chest pain, palpitations, orthopnea, claudication, leg swelling and PND.  Gastrointestinal: Negative for abdominal pain, blood in stool and melena.  Genitourinary: Negative for dysuria and hematuria.  Musculoskeletal: Positive for falls. Negative for myalgias.  Skin: Negative for itching.  Neurological: Positive for loss of consciousness and headaches. Negative for focal weakness.  Endo/Heme/Allergies: Bruises/bleeds easily.    EKGs/Labs/Other Studies Reviewed:    The following studies were reviewed today: [place] 2018/07/23 Findings: RA = 13 RV = 52/14 PA = 50/19 (33) PCW = 16 Fick cardiac output/index = 4.7/2.3 PVR = 3.6 WU Ao sat = 97% PA sat = 64%, 64% SVC sat = 72%   Assessment: 1. Mild PAH with mild RV strain. Left-sided pressures and cardiac output look good 2. No evidence of intracardiac shunting  Plan/Discussion: Would continue with conservative measures including CPAP and weight loss. If pressures worsening can consider selective  pulmonary artery vasodilator down the road.   R/LHC 01/27/17  Mid RCA lesion, 20 %stenosed.  The left ventricular ejection fraction is 55-65% by visual estimate.  The left ventricular systolic function is normal.  LV end diastolic pressure is normal.   Moderate right sided heart pressure elevation with moderate pulmonary hypertension. Normal systolic function with moderate left ventricular hypertrophy with EF estimate of approximately 60%. 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  RECOMMENDATION: Medical therapy  Right Heart Pressures RA: A wave 15, V-wave 13, mean 12 RV: 55/14 PA: 55/19 PW: mean16  AO: 134/72 PA: 55/19; mean 33  LV: 131/18 PW: a 19; v 17; mean 16  LV: 114/17 AO: 114/66; mean 84  Oxygen saturation 92% in the aorta and 68% in the pulmonary artery.  Cardiac output by the thermodilution method 6.5 and by the Fick method 6.2 L/m. Cardiac index 3.6 and 3.3 L/m/m, respectively   Echo 05/28/18 Study Conclusions  - Left ventricle: The cavity size was normal. Wall thickness was   increased in a pattern of mild LVH. Systolic function was normal.   The estimated ejection fraction was in the range of 50% to 55%.   Wall motion was normal; there were no regional wall motion   abnormalities. Indeterminate diastolic function. - Aortic valve: Mildly calcified annulus. Trileaflet. - Mitral valve: Mildly calcified annulus. There was trivial   regurgitation. - Left atrium: The atrium was at the upper limits of normal in   size. - Right ventricle: The cavity size was moderately dilated. Systolic   function was severely reduced. - Right atrium: The atrium was mildly dilated. Central venous   pressure (est): 3 mm Hg. - Tricuspid valve: There was mild regurgitation. - Pulmonary arteries: PA peak pressure: 25 mm Hg (S). - Pericardium, extracardiac: A small pericardial effusion was    identified posterior to the heart.  On my review of her most recent echo, she has a trivial pericardial effusion. Her RV is enlarged, actually appearing larger than her LV on her apical views. Her septum is flattened  and has septal bounce. Her apical RV is trabeculated and hypocontractile, though her TAPSE at the base is normal. Her TR is trivial to mild. I do not think the RVSP is accurate, as the TR jet is not complete. So while her RA-RV gradient was thought to be only 22 mmHg, I think this is likely underrepresented. Her PASP was estimated at 25 mmHg, and I think this is low.  Review of her history: She has had multiple admissions, most recently on 05/27/18, for shortness of breath. In 01/2017 she was admitted and discharged on 3L home O2. CTA negative for PE that admission. In 07/2017 she had hypercarbic respiratory failure with sepsis.  COPD notable for spirometry 02/15/2017: FEV1 52% pred, FVC 60% pred, FEV1/FVC 85% predicted. She was still smoking at the time of these PFTs.  Had an episode when she blacked out on a plane. Not sure what happened, was sitting in her seat and lost consciousness. Plan was getting ready to land at the time, she woke up in an ambulance. Was told it was CHF and pulmonary edema. Was in the hospital for two days in [place]; did ok on the flight home. Had another episode in September of last year when she was septic; lost consciousness.   EKG:  The ekg ordered today demonstrates normal sinus rhythm with nonspecific ST changes. Does not meet strict criteria for RAD/RVH.  Recent Labs: 09/30/2017: ALT 32 05/27/2018: B Natriuretic Peptide 510.0 07/12/2018: BUN 17; Creatinine, Ser 1.24; Hemoglobin 12.7; Platelets 206; Potassium 4.1; Sodium 143  Recent Lipid Panel    Component Value Date/Time   CHOL 144 05/28/2018 0629   TRIG 147 05/28/2018 0629   HDL 31 (L) 05/28/2018 0629   CHOLHDL 4.6 05/28/2018 0629   VLDL 29 05/28/2018 0629   LDLCALC 84 05/28/2018 0629    Physical Exam:     VS:  BP 104/60   Pulse 93   Ht 5\' 6"  (1.676 m)   Wt 209 lb 12.8 oz (95.2 kg)   LMP 08/19/2012   SpO2 96%   BMI 33.86 kg/m     Wt Readings from Last 3 Encounters:  09/05/18 209 lb 12.8 oz (95.2 kg)  09/04/18 215 lb 3.2 oz (97.6 kg)  08/24/18 213 lb 10 oz (96.9 kg)     GEN: Well nourished, well developed in no acute distress. Nasal cannula in place HEENT: Normal NECK: JVD 8 cm, supple neck LYMPHATICS: No lymphadenopathy CARDIAC: regular rhythm, normal S1 and S2, no murmurs, rubs, gallops. Radial and DP pulses 2+ bilaterally. RESPIRATORY:  Somewhat distant but clear to auscultation without rales, wheezing or rhonchi  ABDOMEN: Soft, non-tender, non-distended MUSCULOSKELETAL:  No edema; No deformity  SKIN: Warm and dry NEUROLOGIC:  Alert and oriented x 3 PSYCHIATRIC:  Normal affect   ASSESSMENT:    1. Chronic diastolic (congestive) heart failure (HCC)   2. Pulmonary hypertension ([place])   3. Essential hypertension   4. Chronic respiratory failure with hypoxia (HCC)   5. OSA (obstructive sleep apnea)   6. Chronic obstructive pulmonary disease, unspecified COPD type ([place])   7. Syncope and collapse   8. Coronary artery disease involving native coronary artery of native heart without angina pectoris    PLAN:    1. Chronic diastolic heart failure, Mild Pulmonary hypertension in the setting of COPD and chronic hypoxic respiratory failure requiring home O2: Her volume status is stable today, and based on her right heart cath, this does not appear to be a primarily cardiac issue. Her  left sided filling pressures were near normal, but her PA pressures were elevated. This is consistent with mild-moderate pulmonary hypertension driven by her 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  -continue management of her underlying lung disease -stable on dosing of furosemide, euvolemic today -on chronic home O2  2. Syncope: given that she was ambulating without her O2, I suspect she became profoundly hypoxemic  and lost consciousness. However, without a witness or evaluation at the time, this is unclear. Counseled to use her O2 as instructed.  3. Hypertension: below goal but asymptomatic. Ok to continue amlodipine 2.5 mg for now, but if her BP stays low (and without evidence of spasm/nitrate sensitive PH) would discontinue  4. Nonobstructive asymptomatic CAD: on aspirin and simvastatin, LDL 84.  Not addressed in depth today: OSA: uses CPAP COPD: followed by Dr. Elsworth Soho and the pulmonary clinic  Plan for follow up: 1 year or sooner PRN  Medication Adjustments/Labs and Tests Ordered: Current medicines are reviewed at length with the patient today.  Concerns regarding medicines are outlined above.  Orders Placed This Encounter  Procedures  . EKG 12-Lead   No orders of the defined types were placed in this encounter.   Patient Instructions  Medication Instructions:  Your Physician recommend you continue on your current medication as directed.    If you need a refill on your cardiac medications before your next appointment, please call your pharmacy.   Lab work: None  Testing/Procedures: None  Follow-Up: At [facility], you and your health needs are our priority.  As part of our continuing mission to provide you with exceptional heart care, we have created designated Provider Care Teams.  These Care Teams include your primary Cardiologist (physician) and Advanced Practice Providers (APPs -  Physician Assistants and Nurse Practitioners) who all work together to provide you with the care you need, when you need it. You will need a follow up appointment in 1 years.  Please call our office 2 months in advance to schedule this appointment.  You may see Buford Dresser, MD or one of the following Advanced Practice Providers on your designated Care Team:   Rosaria Ferries, PA-C . Jory Sims, DNP, ANP  Any Other Special Instructions Will Be Listed Below (If Applicable).        Signed, Buford Dresser, MD PhD 09/05/2018 3:27 PM    [place]

## 2018-09-07 ENCOUNTER — Encounter (HOSPITAL_COMMUNITY): Payer: 59

## 2018-09-07 ENCOUNTER — Telehealth: Payer: Self-pay | Admitting: Pulmonary Disease

## 2018-09-07 DIAGNOSIS — G4733 Obstructive sleep apnea (adult) (pediatric): Secondary | ICD-10-CM

## 2018-09-07 NOTE — Telephone Encounter (Signed)
Called and spoke with pt letting her know that she needs to contact the [facility] to let them know we need their OV notes and her sleep study sent to our office so that way we can send it to [facility] for her to be able to receive supplies.  Pt expressed understanding. Nothing further needed.

## 2018-09-07 NOTE — Telephone Encounter (Signed)
Called and spoke with pt to see if she was currently on CPAP and pt states she is currently on CPAP that she got from the [facility].  Pt stated she has been on the CPAP for a year now. I looked at pt's last OV with Wyn Quaker, NP and did not see anything stated in there about patient being on cpap. Pt came for that OV due to needing to be requalified for her O2 due to her switching from the [place] to [facility] for her oxygen.  In pt's problem list, there is a diagnosis of OSA that was noted 05/03/18 and at that visit, it states that patient has sleep apnea and says she is on CPAP at bedtime.  This was managed at the [facility] .  Called [facility] and spoke with Corene Cornea who stated they can take over ordering patient's cpap supplies but her CPAP machine would need to be managed from the previous company who supplied it.  Corene Cornea states they are able to use patient's OV from 06/19/18 with RA that stated the compliance of pt's cpap but stated if we are able to get a copy of the OV from the [place] that stated that information of patient's compliance and also we need to get patient's sleep study that was performed by the [place] and send it to their office.  PCCs, can you help [place] out with this?  I have placed an order for patient to have supplies provided by [facility] for her cpap machine.  Called pt to let her know what we are able to get done for her. Pt expressed understanding.

## 2018-09-07 NOTE — Telephone Encounter (Signed)
We have sent an order to [facility] for CPAP supplies.   I spoke to [place] and she let me know that the [place] will not provide this info to [place], the patient will need to contact the [facility] to get the OV notes & the sleep study where patient was placed on the CPAP.

## 2018-09-08 ENCOUNTER — Telehealth: Payer: Self-pay | Admitting: Pulmonary Disease

## 2018-09-08 DIAGNOSIS — G4733 Obstructive sleep apnea (adult) (pediatric): Secondary | ICD-10-CM

## 2018-09-08 NOTE — Telephone Encounter (Signed)
Order has been placed to [facility] with the necessary cpap supplies.  Called and spoke with Barbaraann Rondo at [facility] letting him know that the order has been placed. Barbaraann Rondo expressed understanding. Nothing further needed.

## 2018-09-12 ENCOUNTER — Encounter (HOSPITAL_COMMUNITY)
Admission: RE | Admit: 2018-09-12 | Discharge: 2018-09-12 | Disposition: A | Discharge status: Home / Self Care | Payer: 59 | Source: Ambulatory Visit | Attending: Pulmonary Disease | Admitting: Pulmonary Disease

## 2018-09-12 VITALS — Wt 198.6 lb

## 2018-09-12 DIAGNOSIS — J9611 Chronic respiratory failure with hypoxia: Secondary | ICD-10-CM | POA: Diagnosis not present

## 2018-09-12 NOTE — Progress Notes (Signed)
Daily Session Note  Patient Details  Name: Teresa Roberts MRN: [id number] Date of Birth: Dec 14, 1960 Referring Provider:     Pulmonary Rehab Walk Test from 08/01/2018 in [place]  Referring Provider  Dr. Elsworth Soho      Encounter Date: 09/12/2018  Check In: Session Check In - 09/12/18 1330      Check-In   Supervising physician immediately available to respond to emergencies  [facility] immediately available    Physician(s)  Dr. Florene Glen    Location  [facility]    Staff Present  Rosebud Poles, RN, BSN;Carlette Wilber Oliphant, RN, BSN;Molly DiVincenzo, MS, ACSM RCEP, Exercise Physiologist;[name], MS, Exercise Physiologist;Arlisa Colletta [place], RN, MHA    Medication changes reported      No    Fall or balance concerns reported     No    Tobacco Cessation  No Change    Warm-up and Cool-down  Performed as group-led instruction    Resistance Training Performed  Yes    VAD Patient?  No    PAD/SET Patient?  No      Pain Assessment   Currently in Pain?  No/denies    Multiple Pain Sites  No       Capillary Blood Glucose: No results found for this or any previous visit (from the past 24 hour(s)).  Exercise Prescription Changes - 09/12/18 1500      Response to Exercise   Blood Pressure (Admit)  114/60    Blood Pressure (Exercise)  128/60    Blood Pressure (Exit)  100/60    Heart Rate (Admit)  90 bpm    Heart Rate (Exercise)  118 bpm    Heart Rate (Exit)  91 bpm    Oxygen Saturation (Admit)  92 %    Oxygen Saturation (Exercise)  94 %    Oxygen Saturation (Exit)  98 %    Rating of Perceived Exertion (Exercise)  15    Perceived Dyspnea (Exercise)  3    Duration  Continue with 45 min of aerobic exercise without signs/symptoms of physical distress.    Intensity  THRR unchanged      Resistance Training   Training Prescription  Yes    Weight  blue bands    Reps  10-15    Time  17 Minutes      Interval Training   Interval Training  No      Oxygen   Oxygen  Continuous    Liters  10      Recumbant Bike   Level  3    Watts  10    Minutes  17      NuStep   Level  2    SPM  80    Minutes  17   has been out 2 weeks, level decreased   METs  1.9      Track   Laps  2       Social History   Tobacco Use  Smoking Status Former Smoker  . Packs/day: 1.00  . Years: 39.00  . Pack years: 39.00  . Types: Cigarettes  . Start date: 07/21/2018  Smokeless Tobacco Never Used  Tobacco Comment   resumed smoking less than a pack a day    Goals Met:  Proper associated with RPD/PD & O2 Sat Exercise tolerated well  Goals Unmet:  Not Applicable  Comments: Service time is from 1330 to 1500    Dr. Rush Farmer is Medical  Director for Pulmonary Rehab at [facility]. 

## 2018-09-14 ENCOUNTER — Encounter (HOSPITAL_COMMUNITY): Payer: 59

## 2018-09-19 ENCOUNTER — Ambulatory Visit: Payer: 59 | Admitting: Pulmonary Disease

## 2018-09-19 ENCOUNTER — Encounter (HOSPITAL_COMMUNITY): Payer: 59

## 2018-09-21 ENCOUNTER — Other Ambulatory Visit: Payer: Self-pay | Admitting: Rheumatology

## 2018-09-21 ENCOUNTER — Encounter (HOSPITAL_COMMUNITY): Payer: 59

## 2018-09-21 NOTE — Telephone Encounter (Signed)
Last visit:06/05/18 Next visit:12/06/18  Okay to refill per Dr. Estanislado Pandy

## 2018-09-23 ENCOUNTER — Emergency Department (HOSPITAL_COMMUNITY): Payer: 59

## 2018-09-23 ENCOUNTER — Emergency Department (HOSPITAL_COMMUNITY)
Admission: EM | Admit: 2018-09-23 | Discharge: 2018-09-23 | Disposition: A | Discharge status: Home / Self Care | Payer: 59 | Attending: Emergency Medicine | Admitting: Emergency Medicine

## 2018-09-23 ENCOUNTER — Encounter (HOSPITAL_COMMUNITY): Payer: Self-pay | Admitting: Internal Medicine

## 2018-09-23 ENCOUNTER — Telehealth: Payer: Self-pay | Admitting: Pulmonary Disease

## 2018-09-23 DIAGNOSIS — F329 Major depressive disorder, single episode, unspecified: Secondary | ICD-10-CM | POA: Insufficient documentation

## 2018-09-23 DIAGNOSIS — J449 Chronic obstructive pulmonary disease, unspecified: Secondary | ICD-10-CM | POA: Diagnosis not present

## 2018-09-23 DIAGNOSIS — Z87891 Personal history of nicotine dependence: Secondary | ICD-10-CM | POA: Diagnosis not present

## 2018-09-23 DIAGNOSIS — I110 Hypertensive heart disease with heart failure: Secondary | ICD-10-CM | POA: Insufficient documentation

## 2018-09-23 DIAGNOSIS — I252 Old myocardial infarction: Secondary | ICD-10-CM | POA: Insufficient documentation

## 2018-09-23 DIAGNOSIS — I10 Essential (primary) hypertension: Secondary | ICD-10-CM | POA: Diagnosis not present

## 2018-09-23 DIAGNOSIS — R0602 Shortness of breath: Secondary | ICD-10-CM | POA: Diagnosis not present

## 2018-09-23 DIAGNOSIS — I2510 Atherosclerotic heart disease of native coronary artery without angina pectoris: Secondary | ICD-10-CM | POA: Insufficient documentation

## 2018-09-23 DIAGNOSIS — J189 Pneumonia, unspecified organism: Secondary | ICD-10-CM | POA: Diagnosis not present

## 2018-09-23 DIAGNOSIS — Z79899 Other long term (current) drug therapy: Secondary | ICD-10-CM | POA: Diagnosis not present

## 2018-09-23 DIAGNOSIS — R05 Cough: Secondary | ICD-10-CM | POA: Diagnosis not present

## 2018-09-23 DIAGNOSIS — I5032 Chronic diastolic (congestive) heart failure: Secondary | ICD-10-CM | POA: Insufficient documentation

## 2018-09-23 DIAGNOSIS — Z7982 Long term (current) use of aspirin: Secondary | ICD-10-CM | POA: Insufficient documentation

## 2018-09-23 DIAGNOSIS — R059 Cough, unspecified: Secondary | ICD-10-CM

## 2018-09-23 LAB — COMPREHENSIVE METABOLIC PANEL
ALBUMIN: 3.7 g/dL (ref 3.5–5.0)
ALT: 13 U/L (ref 0–44)
ANION GAP: 7 (ref 5–15)
AST: 17 U/L (ref 15–41)
Alkaline Phosphatase: 111 U/L (ref 38–126)
BUN: 9 mg/dL (ref 6–20)
CHLORIDE: 105 mmol/L (ref 98–111)
CO2: 30 mmol/L (ref 22–32)
Calcium: 10.4 mg/dL — ABNORMAL HIGH (ref 8.9–10.3)
Creatinine, Ser: 0.94 mg/dL (ref 0.44–1.00)
GFR calc Af Amer: >60 mL/min (ref >60)
GFR calc non Af Amer: >60 mL/min (ref >60)
GLUCOSE: 101 mg/dL — AB (ref 70–99)
POTASSIUM: 3.9 mmol/L (ref 3.5–5.1)
SODIUM: 142 mmol/L (ref 135–145)
Total Bilirubin: 0.5 mg/dL (ref 0.3–1.2)
Total Protein: 7.7 g/dL (ref 6.5–8.1)

## 2018-09-23 LAB — I-STAT TROPONIN, ED: Troponin i, poc: 0 ng/mL (ref 0.00–0.08)

## 2018-09-23 LAB — CBC WITH DIFFERENTIAL/PLATELET
ABS IMMATURE GRANULOCYTES: 0.01 10*3/uL (ref 0.00–0.07)
BASOS PCT: 0 %
Basophils Absolute: 0 10*3/uL (ref 0.0–0.1)
Eosinophils Absolute: 0.2 10*3/uL (ref 0.0–0.5)
Eosinophils Relative: 3 %
HEMATOCRIT: 38.3 % (ref 36.0–46.0)
Hemoglobin: 10 g/dL — ABNORMAL LOW (ref 12.0–15.0)
Immature Granulocytes: 0 %
LYMPHS ABS: 1.2 10*3/uL (ref 0.7–4.0)
LYMPHS PCT: 15 %
MCH: 19.9 pg — ABNORMAL LOW (ref 26.0–34.0)
MCHC: 26.1 g/dL — AB (ref 30.0–36.0)
MCV: 76.3 fL — ABNORMAL LOW (ref 80.0–100.0)
MONOS PCT: 8 %
Monocytes Absolute: 0.6 10*3/uL (ref 0.1–1.0)
NEUTROS PCT: 74 %
Neutro Abs: 6 10*3/uL (ref 1.7–7.7)
Platelets: 247 10*3/uL (ref 150–400)
RBC: 5.02 MIL/uL (ref 3.87–5.11)
RDW: 19.7 % — ABNORMAL HIGH (ref 11.5–15.5)
WBC: 8 10*3/uL (ref 4.0–10.5)
nRBC: 0 % (ref 0.0–0.2)

## 2018-09-23 LAB — D-DIMER, QUANTITATIVE: D-Dimer, Quant: 0.79 ug/mL-FEU — ABNORMAL HIGH (ref 0.00–0.50)

## 2018-09-23 LAB — LIPASE, BLOOD: Lipase: 28 U/L (ref 11–51)

## 2018-09-23 MED ORDER — BENZONATATE 100 MG PO CAPS
100.0000 mg | ORAL_CAPSULE | Freq: Once | ORAL | Status: AC
  Administered 2018-09-23: 100 mg via ORAL
  Filled 2018-09-23: qty 1

## 2018-09-23 MED ORDER — LEVOFLOXACIN 750 MG PO TABS: 750.0000 mg | ORAL_TABLET | Freq: Every day | ORAL | 0 refills | Status: DC

## 2018-09-23 MED ORDER — IOHEXOL 300 MG/ML  SOLN
100.0000 mL | Freq: Once | INTRAMUSCULAR | Status: AC | PRN
  Administered 2018-09-23: 100 mL via INTRAVENOUS

## 2018-09-23 MED ORDER — MORPHINE SULFATE (PF) 4 MG/ML IV SOLN
4.0000 mg | Freq: Once | INTRAVENOUS | Status: AC
  Administered 2018-09-23: 4 mg via INTRAVENOUS
  Filled 2018-09-23: qty 1

## 2018-09-23 MED ORDER — BENZONATATE 100 MG PO CAPS: 100.0000 mg | ORAL_CAPSULE | Freq: Three times a day (TID) | ORAL | 0 refills | Status: DC

## 2018-09-23 MED ORDER — GUAIFENESIN-CODEINE 100-10 MG/5ML PO SOLN
5.0000 mL | Freq: Once | ORAL | Status: AC
  Administered 2018-09-23: 5 mL via ORAL
  Filled 2018-09-23: qty 5

## 2018-09-23 MED ORDER — IPRATROPIUM-ALBUTEROL 0.5-2.5 (3) MG/3ML IN SOLN
3.0000 mL | Freq: Once | RESPIRATORY_TRACT | Status: AC
  Administered 2018-09-23: 3 mL via RESPIRATORY_TRACT
  Filled 2018-09-23: qty 3

## 2018-09-23 MED ORDER — IOPAMIDOL (ISOVUE-370) INJECTION 76%
INTRAVENOUS | Status: AC
  Filled 2018-09-23: qty 100

## 2018-09-23 MED ORDER — LEVOFLOXACIN 750 MG PO TABS
750.0000 mg | ORAL_TABLET | Freq: Once | ORAL | Status: AC
  Administered 2018-09-23: 750 mg via ORAL
  Filled 2018-09-23: qty 1

## 2018-09-23 MED ORDER — ONDANSETRON HCL 4 MG/2ML IJ SOLN
4.0000 mg | Freq: Once | INTRAMUSCULAR | Status: DC
  Filled 2018-09-23: qty 2

## 2018-09-23 MED ORDER — IOPAMIDOL (ISOVUE-370) INJECTION 76%
100.0000 mL | Freq: Once | INTRAVENOUS | Status: AC | PRN
  Administered 2018-09-23: 100 mL via INTRAVENOUS

## 2018-09-23 NOTE — ED Provider Notes (Signed)
4:20 PM Handoff from [employer] at shift change.  Patient with history of COPD, oxygen dependent on 6 L at home, heart failure --presents with ongoing cough.  Patient has ambulated in the department and has not dropped her oxygen saturation on her normal home oxygen level.  Patient had an elevated d-dimer prompting CT angiography which is pending.  CTA reviewed by myself.  No sign of pulmonary embolism.  Patient does have some lymphadenopathy and signs of a lingular pneumonia.  Patient will be placed on Levaquin.  She has a low risk curb 65 and port score (class II).  Patient is requesting discharge to home.  She appears well and in no distress on recheck.  She is not coughing.  Patient sees Dr. Stephanie Acre.  I have encouraged patient to call her doctor in 2 days to arrange a follow-up appointment and recheck.  We also discussed that if she starts feeling worse, has increased shortness of breath or trouble breathing, that she should return to the emergency department.  Patient verbalizes understanding agrees with plan.  BP 119/78   Pulse (!) 54   Temp (S) 99 F (37.2 C) (Rectal)   Resp 15   Ht 5\' 6"  (1.676 m)   Wt 93.4 kg   LMP 08/19/2012   SpO2 99%   BMI 33.25 kg/m     Carlisle Cater, PA-C 09/23/18 1623    Sherwood Gambler, MD 09/24/18 [phone]

## 2018-09-23 NOTE — ED Provider Notes (Signed)
[facility] EMERGENCY DEPARTMENT Provider Note   CSN: [id number] Arrival date & time: 09/23/18  0998     History   Chief Complaint No chief complaint on file.   HPI Teresa Roberts is a 57 y.o. female.  The history is provided by the patient and medical records. No language interpreter was used.  Cough      57 year old female with history of fibromyalgia, COPD who is O2 dependent at 6 L at home, CHF, CAD presenting with cold symptoms.  Patient report for the past 2 weeks she has had chills, headache, congestion, sneezing, coughing, ear pain, wheezing.  Symptom has been persistent, moderate in severity.  She has increase her oxygen requirement.  She has been taking Mucinex.  Her symptoms started shortly after she received her flu shot.  She does not complaining of pleuritic chest pain aside from diaphragmatic pain from persistent cough.  No nausea vomiting diarrhea or hemoptysis.  No recent travel.  Past Medical History:  Diagnosis Date  . CAD (coronary artery disease)    a. 01/2017: cath showing 20% mid RCA stenosis and otherwise normal LAD and LCx with a preserved EF of 55 to 60%; mod pulmonary HTN  . COPD (chronic obstructive pulmonary disease) ([place])    O2 dependent  . cpap   . Depression    PTSD  . Diastolic CHF ([place])   . Fibromyalgia   . Hyperlipidemia   . Hypertension   . Migraines     Patient Active Problem List   Diagnosis Date Noted  . Coronary artery disease involving native coronary artery of native heart without angina pectoris 09/05/2018  . Syncope and collapse 09/05/2018  . Chest pain 05/27/2018  . HLD (hyperlipidemia) 05/27/2018  . Essential hypertension 05/27/2018  . Depression 05/27/2018  . OSA (obstructive sleep apnea) 05/03/2018  . Pulmonary hypertension ([place]) 05/03/2018  . Bimalleolar fracture of left ankle 08/12/2017  . Acute renal failure (ARF) ([place]) 08/09/2017  . Chronic diastolic (congestive) heart failure ([place]) 08/09/2017  .  Acute metabolic encephalopathy 33/82/5053  . Other fatigue 06/13/2017  . Primary insomnia 06/13/2017  . History of migraine 06/13/2017  . COPD (chronic obstructive pulmonary disease) ([place]) 02/15/2017  . Tobacco abuse 02/15/2017  . Respiratory failure with hypoxia ([place]) 01/28/2017  . Migraine 01/28/2017  . Daily headache   . Hypoxia   . Shortness of breath 01/26/2017  . Non-ST elevation myocardial infarction (NSTEMI), type 2 01/26/2017  . MGUS (monoclonal gammopathy of unknown significance) 01/11/2012  . Fibromyalgia 01/11/2012  . Bruises easily 01/11/2012    Past Surgical History:  Procedure Laterality Date  . APPENDECTOMY    . CESAREAN SECTION    . ORIF ANKLE FRACTURE Left 08/12/2017   Procedure: OPEN REDUCTION INTERNAL FIXATION (ORIF) ANKLE FRACTURE;  Surgeon: Rod Can, MD;  Location: [facility];  Service: Orthopedics;  Laterality: Left;  . RIGHT HEART CATH N/A 07/19/2018   Procedure: RIGHT HEART CATH;  Surgeon: Jolaine Artist, MD;  Location: [facility];  Service: Cardiovascular;  Laterality: N/A;  . RIGHT/LEFT HEART CATH AND CORONARY ANGIOGRAPHY N/A 01/27/2017   Procedure: Right/Left Heart Cath and Coronary Angiography;  Surgeon: Troy Sine, MD;  Location: [facility];  Service: Cardiovascular;  Laterality: N/A;     OB History   None      Home Medications    Prior to Admission medications   Medication Sig Start Date End Date Taking? Authorizing Provider  albuterol (PROVENTIL) (2.5 MG/3ML) 0.083% nebulizer solution Take  3 mLs (2.5 mg total) by nebulization every 6 (six) hours as needed for wheezing or shortness of breath. 06/19/18   Rigoberto Noel, MD  amLODipine (NORVASC) 2.5 MG tablet Take 2.5 mg by mouth daily. 05/11/18   [provider]  aspirin 81 MG EC tablet Take 81 mg by mouth daily after lunch. 05/11/18   [provider]  busPIRone (BUSPAR) 15 MG tablet Take 15 mg by mouth 2 (two) times daily.    [provider]    docusate sodium (COLACE) 100 MG capsule Take 100 mg by mouth 3 (three) times daily.    [provider]  DULoxetine (CYMBALTA) 30 MG capsule Take 30 mg by mouth daily. 08/02/18   [provider]  Fluticasone-Umeclidin-Vilant (TRELEGY ELLIPTA) 100-62.5-25 MCG/INH AEPB Inhale 1 puff into the lungs daily. 06/19/18   Rigoberto Noel, MD  Fluticasone-Umeclidin-Vilant (TRELEGY ELLIPTA) 100-62.5-25 MCG/INH AEPB Inhale 1 puff into the lungs daily. 09/04/18   Lauraine Rinne, NP  folic acid (FOLVITE) 1 MG tablet Take 1 mg by mouth daily after lunch. 05/11/18   [provider]  furosemide (LASIX) 40 MG tablet Take 1 tablet (40 mg total) by mouth daily. 05/29/18 05/29/19  Oswald Hillock, MD  gabapentin (NEURONTIN) 800 MG tablet Take 800 mg by mouth 2 (two) times daily.    [provider]  ipratropium-albuterol (DUONEB) 0.5-2.5 (3) MG/3ML SOLN Take 3 mLs by nebulization every 6 (six) hours as needed (Shortness of breath). 05/29/18   Oswald Hillock, MD  latanoprost (XALATAN) 0.005 % ophthalmic solution Place 1 drop into both eyes at bedtime.    [provider]  metaxalone (SKELAXIN) 800 MG tablet TAKE 1 TABLET (800 MG TOTAL) BY MOUTH 3 (THREE) TIMES DAILY AS NEEDED FOR MUSCLE SPASMS. 09/21/18   Bo Merino, MD  modafinil (PROVIGIL) 100 MG tablet Take 100 mg by mouth daily. 04/24/18   [provider]  Multiple Vitamins-Minerals (HAIR/SKIN/NAILS/BIOTIN) TABS Take 1 tablet by mouth daily after lunch.    [provider]  omeprazole (PRILOSEC) 20 MG capsule Take by mouth daily. 06/01/18   [provider]  OXYGEN Inhale 4 L into the lungs continuous.    [provider]  potassium chloride 20 MEQ TBCR Take 20 mEq by mouth daily. 05/29/18   Oswald Hillock, MD  rizatriptan (MAXALT-MLT) 10 MG disintegrating tablet Take 10 mg by mouth as needed for migraine. May repeat in 2 hours if needed    [provider]  simvastatin (ZOCOR) 10 MG tablet  Take 10 mg by mouth at bedtime.    [provider]  tiZANidine (ZANAFLEX) 4 MG tablet TAKE 1 TABLET (4 MG TOTAL) BY MOUTH AT BEDTIME AS NEEDED FOR MUSCLE SPASMS. 08/31/18   Bo Merino, MD  topiramate (TOPAMAX) 25 MG tablet Take 50 mg by mouth daily.     [provider]  traZODone (DESYREL) 100 MG tablet Take 100 mg by mouth at bedtime.     [provider]  vitamin B-12 (CYANOCOBALAMIN) 1000 MCG tablet Take 1,000 mcg by mouth daily after lunch.    [provider]    Family History Family History  Problem Relation Age of Onset  . Diabetes Mother   . Hypertension Mother   . CAD Mother   . Diabetes Father   . Lung cancer Father   . Diabetes Sister     Social History Social History   Tobacco Use  . Smoking status: Former Smoker    Packs/day: 1.00  Years: 39.00    Pack years: 39.00    Types: Cigarettes    Start date: 07/21/2018  . Smokeless tobacco: Never Used  . Tobacco comment: resumed smoking less than a pack a day  Substance Use Topics  . Alcohol use: No    Frequency: Never  . Drug use: No     Allergies   Citrus and Lisinopril   Review of Systems Review of Systems  Respiratory: Positive for cough.   All other systems reviewed and are negative.    Physical Exam Updated Vital Signs LMP 08/19/2012   Physical Exam  Constitutional: She is oriented to person, place, and time. She appears well-developed and well-nourished. No distress.  Obese female nontoxic in appearance  HENT:  Head: Atraumatic.  Right Ear: External ear normal.  Left Ear: External ear normal.  Nose: Nose normal.  Mouth/Throat: Oropharynx is clear and moist.  Eyes: Conjunctivae are normal.  Neck: Normal range of motion. Neck supple. No JVD present.  Cardiovascular: Normal rate and regular rhythm.  Pulmonary/Chest: She has wheezes. She has no rales.  Decreased breath sounds with faint expiratory wheezes.  Abdominal: Soft. There is no tenderness.   Musculoskeletal: She exhibits no edema.  Neurological: She is alert and oriented to person, place, and time.  Skin: No rash noted.  Psychiatric: She has a normal mood and affect.  Nursing note and vitals reviewed.    ED Treatments / Results  Labs (all labs ordered are listed, but only abnormal results are displayed) Labs Reviewed  CBC WITH DIFFERENTIAL/PLATELET - Abnormal; Notable for the following components:      Result Value   Hemoglobin 10.0 (*)    MCV 76.3 (*)    MCH 19.9 (*)    MCHC 26.1 (*)    RDW 19.7 (*)    All other components within normal limits  D-DIMER, QUANTITATIVE (NOT AT [facility]) - Abnormal; Notable for the following components:   D-Dimer, Quant 0.79 (*)    All other components within normal limits  COMPREHENSIVE METABOLIC PANEL - Abnormal; Notable for the following components:   Glucose, Bld 101 (*)    Calcium 10.4 (*)    All other components within normal limits  LIPASE, BLOOD  I-STAT TROPONIN, ED    EKG EKG Interpretation  Date/Time:  Saturday September 23 2018 08:08:28 EST Ventricular Rate:  76 PR Interval:    QRS Duration: 81 QT Interval:  350 QTC Calculation: 394 R Axis:   65 Text Interpretation:  Sinus rhythm T wave changes less prominent compared to July 2019 Artifact Confirmed by Sherwood Gambler [phone]) on 09/23/2018 8:11:16 AM   Radiology Dg Chest 2 View  Result Date: 09/23/2018 CLINICAL DATA:  Patient with cough.  Ear pain. EXAM: CHEST - 2 VIEW COMPARISON:  Chest radiograph 05/27/2018 FINDINGS: Monitoring leads overlie the patient. Stable cardiomegaly. No large area pulmonary consolidation. No pleural effusion or pneumothorax. Thoracic spine degenerative changes. IMPRESSION: No acute cardiopulmonary process. Electronically Signed   By: Lovey Newcomer M.D.   On: 09/23/2018 09:24    Procedures Procedures (including critical care time)  Medications Ordered in ED Medications  ondansetron (ZOFRAN) injection 4 mg (4 mg Intravenous Not Given  09/23/18 1108)  iopamidol (ISOVUE-370) 76 % injection (has no administration in time range)  ipratropium-albuterol (DUONEB) 0.5-2.5 (3) MG/3ML nebulizer solution 3 mL (3 mLs Nebulization Given 09/23/18 0813)  benzonatate (TESSALON) capsule 100 mg (100 mg Oral Given 09/23/18 0901)  morphine 4 MG/ML injection 4 mg (4 mg Intravenous Given 09/23/18 1002)  ipratropium-albuterol (DUONEB) 0.5-2.5 (3) MG/3ML nebulizer solution 3 mL (3 mLs Nebulization Given 09/23/18 1002)  guaiFENesin-codeine 100-10 MG/5ML solution 5 mL (5 mLs Oral Given 09/23/18 1142)  iohexol (OMNIPAQUE) 300 MG/ML solution 100 mL (100 mLs Intravenous Contrast Given 09/23/18 1234)     Initial Impression / Assessment and Plan / ED Course  I have reviewed the triage vital signs and the nursing notes.  Pertinent labs & imaging results that were available during my care of the patient were reviewed by me and considered in my medical decision making (see chart for details).     BP 119/78   Pulse (!) 54   Temp (S) 99 F (37.2 C) (Rectal)   Resp 15   Ht 5\' 6"  (1.676 m)   Wt 93.4 kg   LMP 08/19/2012   SpO2 99%   BMI 33.25 kg/m    Final Clinical Impressions(s) / ED Diagnoses   Final diagnoses:  None    ED Discharge Orders    None     8:11 AM Patient here with cold symptoms which has been ongoing for the past 2 weeks.  She does have significant history of COPD that is oxygen dependent on 6 L.  Symptom is not consistent with ACS or PE.  Symptom not likely to be CHF exacerbation as her complaint is more consistent with URI.  Chest x-ray ordered to rule out pneumonia.  9:29 AM Chest x-ray unremarkable, EKG reassuring, vital signs stable, no hypoxia, normal heart rate.  Will provide symptomatic treatment for her flulike symptoms.  9:45 AM While in the room, patient is coughing profusely, she is diaphoretic and appears very uncomfortable.  She is complaining of "diaphragmatic pain" from coughing.  Given this presentation, will  check labs, obtain d-dimer, provide pain medication and will monitor closely.  At this time patient does not wants to ambulate to check O2.  3:42 PM Labs were reassuring, normal lipase, normal electrolytes panel, normal WBC, hemoglobin is 10, troponins negative, mild elevated d-dimer of 0.79, chest CT angiogram ordered.  Patient received several dose of DuoNeb's in the ED, now able to ambulate without difficulty wearing her supplemental oxygen.  Care discussed with Alecia Lemming, PA-C, will follow on chest CTA.  If negative, anticipate discharge home with symptomatic treatment for her viral infection such as Tessalon Perles, and guaifenesin.  It is reasonable to place patient on either a Z-Pak, or a fluoroquinolone for potential bacterial infection due to the prolonged nature of her symptoms.   Domenic Moras, PA-C 09/23/18 1549    Sherwood Gambler, MD 09/24/18 [phone]

## 2018-09-23 NOTE — ED Notes (Signed)
When coming in to pt room to see if she can walk for me pt stated that she is not going to be able to walk right now, because she has been coughing a lot.

## 2018-09-23 NOTE — Telephone Encounter (Signed)
Reviewed records - 08/2015 NPSG AHI 16/h, nadir 66% 11/2015 CPAP titrn >> 12- 20 cm + 2L O2  Pl send copy to [facility] so she can get supplies - can use prior OV notes

## 2018-09-23 NOTE — ED Triage Notes (Signed)
Pt c/o cough, ear pain, and diaphoresis x2 weeks. States "I just feel miserable." Denies N/V/D. Endorses shortness of breath. Normally wears 6L O2 at home. Attempted mucinex at home with no relief.

## 2018-09-23 NOTE — ED Notes (Signed)
Patient is asleep and resting comfortably.  

## 2018-09-23 NOTE — ED Notes (Signed)
IV team bedside. 

## 2018-09-23 NOTE — ED Notes (Addendum)
Pt did well ambulated in hall and back to room pt o2 was 95% to 100 % with good wave. Pt did not cough until arriving back to room and sit down on beds

## 2018-09-23 NOTE — ED Notes (Signed)
ED Provider at bedside. 

## 2018-09-23 NOTE — ED Notes (Signed)
Patient transported to X-ray 

## 2018-09-23 NOTE — Discharge Instructions (Signed)
Please read and follow all provided instructions.  Your diagnoses today include:  1. Community acquired pneumonia of left lung, unspecified part of lung   2. Cough     Tests performed today include:  Blood counts and electrolytes  Chest x-ray -- was normal  CT of the chest -- no blood clots but concern for pneumonia  Blood counts and electrolytes  Vital signs. See below for your results today.   Medications prescribed:   Levaquin - medication for pneumonia   Tessalon Perles - cough suppressant medication  Take any prescribed medications only as directed.  Home care instructions:  Follow any educational materials contained in this packet.  Follow-up instructions: Please follow-up with your primary care provider in 2 days for further evaluation of your symptoms and to ensure resolution of your infection.   Return instructions:   Please return to the Emergency Department if you experience worsening symptoms.   Return immediately with worsening breathing, worsening shortness of breath, or if you feel it is taking you more effort to breathe.   Please return if you have any other emergent concerns.  Additional Information:  Your vital signs today were: BP 119/78    Pulse (!) 54    Temp (S) 99 F (37.2 C) (Rectal)    Resp 15    Ht 5\' 6"  (1.676 m)    Wt 93.4 kg    LMP 08/19/2012    SpO2 99%    BMI 33.25 kg/m  If your blood pressure (BP) was elevated above 135/85 this visit, please have this repeated by your doctor within one month. --------------

## 2018-09-25 ENCOUNTER — Ambulatory Visit: Payer: 59 | Admitting: Pulmonary Disease

## 2018-09-26 ENCOUNTER — Other Ambulatory Visit: Payer: Self-pay | Admitting: Rheumatology

## 2018-09-26 ENCOUNTER — Encounter (HOSPITAL_COMMUNITY): Payer: 59

## 2018-09-26 NOTE — Telephone Encounter (Signed)
Last visit:06/05/18 Next visit:12/06/18  Okay to refill per Dr. Estanislado Pandy

## 2018-09-26 NOTE — Telephone Encounter (Signed)
Information has been sent to [facility] and scan. Will close this encounter.

## 2018-09-27 NOTE — Progress Notes (Signed)
Pulmonary Individual Treatment Plan  Patient Details  Name: Teresa Roberts MRN: [id number] Date of Birth: May 10, 1961 Referring Provider:     Pulmonary Rehab Walk Test from 08/01/2018 in [facility]  Referring Provider  Dr. Elsworth Soho      Initial Encounter Date:    Pulmonary Rehab Walk Test from 08/01/2018 in [facility]  Date  08/03/18      Visit Diagnosis: Chronic respiratory failure with hypoxia ([place])  Patient's Home Medications on Admission:   Current Outpatient Medications:  .  albuterol (PROVENTIL) (2.5 MG/3ML) 0.083% nebulizer solution, Take 3 mLs (2.5 mg total) by nebulization every 6 (six) hours as needed for wheezing or shortness of breath., Disp: 75 mL, Rfl: 12 .  amLODipine (NORVASC) 2.5 MG tablet, Take 2.5 mg by mouth daily., Disp: , Rfl: 0 .  aspirin 81 MG EC tablet, Take 81 mg by mouth daily after lunch., Disp: , Rfl: 0 .  benzonatate (TESSALON) 100 MG capsule, Take 1 capsule (100 mg total) by mouth every 8 (eight) hours., Disp: 15 capsule, Rfl: 0 .  busPIRone (BUSPAR) 15 MG tablet, Take 15 mg by mouth 2 (two) times daily., Disp: , Rfl:  .  docusate sodium (COLACE) 100 MG capsule, Take 100 mg by mouth 3 (three) times daily., Disp: , Rfl:  .  dorzolamide-timolol (COSOPT) 22.3-6.8 MG/ML ophthalmic solution, Place 1 drop into both eyes 2 (two) times daily., Disp: , Rfl:  .  DULoxetine (CYMBALTA) 30 MG capsule, Take 30 mg by mouth daily., Disp: , Rfl: 5 .  Fluticasone-Umeclidin-Vilant (TRELEGY ELLIPTA) 100-62.5-25 MCG/INH AEPB, Inhale 1 puff into the lungs daily., Disp: 28 each, Rfl: 3 .  Fluticasone-Umeclidin-Vilant (TRELEGY ELLIPTA) 100-62.5-25 MCG/INH AEPB, Inhale 1 puff into the lungs daily. (Patient not taking: Reported on 09/23/2018), Disp: 2 each, Rfl: 0 .  folic acid (FOLVITE) 1 MG tablet, Take 1 mg by mouth daily after lunch., Disp: , Rfl: 0 .  furosemide (LASIX) 40 MG tablet, Take 1 tablet (40 mg total) by mouth  daily., Disp: 30 tablet, Rfl: 11 .  gabapentin (NEURONTIN) 800 MG tablet, Take 800 mg by mouth 2 (two) times daily., Disp: , Rfl:  .  guaiFENesin (MUCINEX) 600 MG 12 hr tablet, Take 1,200 mg by mouth 2 (two) times daily as needed for cough or to loosen phlegm., Disp: , Rfl:  .  guaifenesin (ROBITUSSIN) 100 MG/5ML syrup, Take 200 mg by mouth as needed for cough (at bedtime)., Disp: , Rfl:  .  ipratropium-albuterol (DUONEB) 0.5-2.5 (3) MG/3ML SOLN, Take 3 mLs by nebulization every 6 (six) hours as needed (Shortness of breath)., Disp: 360 mL, Rfl: 1 .  latanoprost (XALATAN) 0.005 % ophthalmic solution, Place 1 drop into both eyes at bedtime., Disp: , Rfl:  .  levofloxacin (LEVAQUIN) 750 MG tablet, Take 1 tablet (750 mg total) by mouth daily., Disp: 6 tablet, Rfl: 0 .  metaxalone (SKELAXIN) 800 MG tablet, TAKE 1 TABLET (800 MG TOTAL) BY MOUTH 3 (THREE) TIMES DAILY AS NEEDED FOR MUSCLE SPASMS., Disp: 90 tablet, Rfl: 0 .  modafinil (PROVIGIL) 100 MG tablet, Take 100 mg by mouth daily., Disp: , Rfl: 4 .  Multiple Vitamins-Minerals (HAIR/SKIN/NAILS/BIOTIN) TABS, Take 1 tablet by mouth daily after lunch., Disp: , Rfl:  .  omeprazole (PRILOSEC) 20 MG capsule, Take by mouth daily., Disp: , Rfl: 4 .  OXYGEN, Inhale 5 L into the lungs continuous. , Disp: , Rfl:  .  potassium chloride 20 MEQ TBCR, Take 20  mEq by mouth daily., Disp: 30 tablet, Rfl: 1 .  rizatriptan (MAXALT-MLT) 10 MG disintegrating tablet, Take 10 mg by mouth as needed for migraine. May repeat in 2 hours if needed, Disp: , Rfl:  .  simvastatin (ZOCOR) 10 MG tablet, Take 10 mg by mouth at bedtime., Disp: , Rfl:  .  tiZANidine (ZANAFLEX) 4 MG tablet, TAKE 1 TABLET (4 MG TOTAL) BY MOUTH AT BEDTIME AS NEEDED FOR MUSCLE SPASMS., Disp: 30 tablet, Rfl: 0 .  topiramate (TOPAMAX) 25 MG tablet, Take 50 mg by mouth daily. , Disp: , Rfl:  .  traZODone (DESYREL) 100 MG tablet, Take 100 mg by mouth at bedtime. , Disp: , Rfl:  .  vitamin B-12 (CYANOCOBALAMIN)  1000 MCG tablet, Take 1,000 mcg by mouth daily after lunch., Disp: , Rfl:   Past Medical History: Past Medical History:  Diagnosis Date  . CAD (coronary artery disease)    a. 01/2017: cath showing 20% mid RCA stenosis and otherwise normal LAD and LCx with a preserved EF of 55 to 60%; mod pulmonary HTN  . COPD (chronic obstructive pulmonary disease) ([place])    O2 dependent  . cpap   . Depression    PTSD  . Diastolic CHF ([place])   . Fibromyalgia   . Hyperlipidemia   . Hypertension   . Migraines     Tobacco Use: Social History   Tobacco Use  Smoking Status Former Smoker  . Packs/day: 1.00  . Years: 39.00  . Pack years: 39.00  . Types: Cigarettes  . Start date: 07/21/2018  Smokeless Tobacco Never Used  Tobacco Comment   resumed smoking less than a pack a day    Labs: Recent Review Flowsheet Data    Labs for ITP Cardiac and Pulmonary Rehab Latest Ref Rng & Units 08/09/2017 05/28/2018 07/19/2018 07/19/2018 07/19/2018   Cholestrol 0 - 200 mg/dL - 144 - - -   LDLCALC 0 - 99 mg/dL - 84 - - -   HDL >40 mg/dL - 31(L) - - -   Trlycerides <150 mg/dL - 147 - - -   Hemoglobin A1c 4.8 - 5.6 % - 6.3(H) - - -   PHART 7.350 - 7.450 - - - - -   PCO2ART 32.0 - 48.0 mmHg - - - - -   HCO3 20.0 - 28.0 mmol/L - - 28.4(H) 29.6(H) 28.7(H)   TCO2 22 - 32 mmol/L - - _0 ACIDBASEDEF 0.0 - 2.0 mmol/L - - - - -   O2SAT % 98.9 - 64.0 64.0 72.0      Capillary Blood Glucose: Lab Results  Component Value Date   GLUCAP 106 (H) 08/09/2017     Pulmonary Assessment Scores: Pulmonary Assessment Scores    Row Name 08/02/18 1515 08/03/18 0748       ADL UCSD   ADL Phase  Entry  Entry    SOB Score total  105  -      CAT Score   CAT Score  30  -      mMRC Score   mMRC Score  -  2       Pulmonary Function Assessment:   Exercise Target Goals: Exercise Program Goal: Individual exercise prescription set using results from initial 6 min walk test and THRR while considering  patient's activity  barriers and safety.   Exercise Prescription Goal: Initial exercise prescription builds to 30-45 minutes a day of aerobic activity, 2-3 days per week.  Home exercise guidelines will  be given to patient during program as part of exercise prescription that the participant will acknowledge.  Activity Barriers & Risk Stratification: Activity Barriers & Cardiac Risk Stratification - 07/28/18 1030      Activity Barriers & Cardiac Risk Stratification   Activity Barriers  Fibromyalgia;Balance Concerns;History of Falls;Joint Problems;Deconditioning;Shortness of Breath    Cardiac Risk Stratification  Moderate       6 Minute Walk: 6 Minute Walk    Row Name 08/03/18 0744         6 Minute Walk   Phase  Initial     Distance  800 feet     Walk Time  - 5 minutes     # of Rest Breaks  1 stopped by Ep due to desats     MPH  1.5     METS  2.15     RPE  12     Perceived Dyspnea   1     Comments  used wheelchair/slow recovery/forhead probe/requested oxymizer     Resting HR  89 bpm     Resting BP  128/80     Resting Oxygen Saturation   97 %     Exercise Oxygen Saturation  during 6 min walk  81 %     Max Ex. HR  110 bpm     Max Ex. BP  140/80       Interval HR   1 Minute HR  - would not read on pulse ox     2 Minute HR  - placed forhead probe     3 Minute HR  106     4 Minute HR  106     5 Minute HR  110     6 Minute HR  110     2 Minute Post HR  98     Interval Heart Rate?  Yes       Interval Oxygen   Interval Oxygen?  Yes     Baseline Oxygen Saturation %  97 %     1 Minute Liters of Oxygen  4 L     2 Minute Liters of Oxygen  4 L     3 Minute Oxygen Saturation %  81 %     3 Minute Liters of Oxygen  4 L     4 Minute Oxygen Saturation %  82 %     4 Minute Liters of Oxygen  8 L     5 Minute Oxygen Saturation %  87 %     5 Minute Liters of Oxygen  10 L     6 Minute Oxygen Saturation %  85 %     6 Minute Liters of Oxygen  10 L     2 Minute Post Oxygen Saturation %  88 %     2 Minute  Post Liters of Oxygen  10 L        Oxygen Initial Assessment: Oxygen Initial Assessment - 08/03/18 0747      Initial 6 min Walk   Oxygen Used  Continuous;E-Tanks    Liters per minute  10   maintained oxygen at 85%-will re-evaluate on first day of rehab     Program Oxygen Prescription   Program Oxygen Prescription  Continuous;E-Tanks    Liters per minute  10    Comments  Will re-evaluate patient's needs since 10 liters only maintained her at 85% with walking. Requested oxymizer       Intervention   Short Term Goals  To learn and exhibit compliance with exercise, home and travel O2 prescription;To learn and understand importance of maintaining oxygen saturations>88%;To learn and demonstrate proper use of respiratory medications;To learn and understand importance of monitoring SPO2 with pulse oximeter and demonstrate accurate use of the pulse oximeter.;To learn and demonstrate proper pursed lip breathing techniques or other breathing techniques.    Long  Term Goals  Exhibits compliance with exercise, home and travel O2 prescription;Verbalizes importance of monitoring SPO2 with pulse oximeter and return demonstration;Maintenance of O2 saturations>88%;Exhibits proper breathing techniques, such as pursed lip breathing or other method taught during program session;Compliance with respiratory medication;Demonstrates proper use of MDI's       Oxygen Re-Evaluation: Oxygen Re-Evaluation    Row Name 08/31/18 0744 09/27/18 0954           Program Oxygen Prescription   Program Oxygen Prescription  Continuous;E-Tanks  Continuous;E-Tanks      Liters per minute  10  10      Comments  Will re-evaluate patient's needs since 10 liters only maintained her at 85% with walking. Requested oxymizer   -        Home Oxygen   Home Oxygen Device  Portable Concentrator;Home Concentrator  Portable Concentrator;Home Concentrator      Sleep Oxygen Prescription  CPAP;Continuous  CPAP;Continuous      Liters per  minute  4  4      Home Exercise Oxygen Prescription  Continuous  Continuous      Liters per minute  10  10      Home at Rest Exercise Oxygen Prescription  Pulsed  Continuous      Liters per minute  4  8      Compliance with Home Oxygen Use  Yes  Yes        Goals/Expected Outcomes   Short Term Goals  To learn and exhibit compliance with exercise, home and travel O2 prescription;To learn and understand importance of maintaining oxygen saturations>88%;To learn and demonstrate proper use of respiratory medications;To learn and understand importance of monitoring SPO2 with pulse oximeter and demonstrate accurate use of the pulse oximeter.;To learn and demonstrate proper pursed lip breathing techniques or other breathing techniques.  To learn and exhibit compliance with exercise, home and travel O2 prescription;To learn and understand importance of maintaining oxygen saturations>88%;To learn and demonstrate proper use of respiratory medications;To learn and understand importance of monitoring SPO2 with pulse oximeter and demonstrate accurate use of the pulse oximeter.;To learn and demonstrate proper pursed lip breathing techniques or other breathing techniques.      Long  Term Goals  Exhibits compliance with exercise, home and travel O2 prescription;Verbalizes importance of monitoring SPO2 with pulse oximeter and return demonstration;Maintenance of O2 saturations>88%;Exhibits proper breathing techniques, such as pursed lip breathing or other method taught during program session;Compliance with respiratory medication;Demonstrates proper use of MDI's  Exhibits compliance with exercise, home and travel O2 prescription;Verbalizes importance of monitoring SPO2 with pulse oximeter and return demonstration;Maintenance of O2 saturations>88%;Exhibits proper breathing techniques, such as pursed lip breathing or other method taught during program session;Compliance with respiratory medication;Demonstrates proper use of  MDI's      Goals/Expected Outcomes  understanding of high liter flows  understanding of high liter flows         Oxygen Discharge (Final Oxygen Re-Evaluation): Oxygen Re-Evaluation - 09/27/18 0954      Program Oxygen Prescription   Program Oxygen Prescription  Continuous;E-Tanks    Liters per minute  10      Home  Oxygen   Home Oxygen Device  Portable Concentrator;Home Concentrator    Sleep Oxygen Prescription  CPAP;Continuous    Liters per minute  4    Home Exercise Oxygen Prescription  Continuous    Liters per minute  10    Home at Rest Exercise Oxygen Prescription  Continuous    Liters per minute  8    Compliance with Home Oxygen Use  Yes      Goals/Expected Outcomes   Short Term Goals  To learn and exhibit compliance with exercise, home and travel O2 prescription;To learn and understand importance of maintaining oxygen saturations>88%;To learn and demonstrate proper use of respiratory medications;To learn and understand importance of monitoring SPO2 with pulse oximeter and demonstrate accurate use of the pulse oximeter.;To learn and demonstrate proper pursed lip breathing techniques or other breathing techniques.    Long  Term Goals  Exhibits compliance with exercise, home and travel O2 prescription;Verbalizes importance of monitoring SPO2 with pulse oximeter and return demonstration;Maintenance of O2 saturations>88%;Exhibits proper breathing techniques, such as pursed lip breathing or other method taught during program session;Compliance with respiratory medication;Demonstrates proper use of MDI's    Goals/Expected Outcomes  understanding of high liter flows       Initial Exercise Prescription: Initial Exercise Prescription - 08/03/18 0700      Date of Initial Exercise RX and Referring Provider   Date  08/03/18    Referring Provider  Dr. Elsworth Soho      Oxygen   Oxygen  Continuous    Liters  10      Recumbant Bike   Level  2    Watts  10    Minutes  17      NuStep    Level  2    SPM  80    Minutes  17    METs  1.5      Track   Laps  10    Minutes  17      Prescription Details   Frequency (times per week)  2    Duration  Progress to 45 minutes of aerobic exercise without signs/symptoms of physical distress      Intensity   THRR 40-80% of Max Heartrate  66-132    Ratings of Perceived Exertion  11-13    Perceived Dyspnea  0-4      Progression   Progression  Continue to progress workloads to maintain intensity without signs/symptoms of physical distress.      Resistance Training   Training Prescription  Yes    Weight  blue bands    Reps  10-15       Perform Capillary Blood Glucose checks as needed.  Exercise Prescription Changes:  Exercise Prescription Changes    Row Name 08/10/18 1627 08/24/18 0907 09/12/18 1500         Response to Exercise   Blood Pressure (Admit)  106/68  120/70  114/60     Blood Pressure (Exercise)  124/78  132/54  128/60     Blood Pressure (Exit)  116/72  104/66  100/60     Heart Rate (Admit)  95 bpm  83 bpm  90 bpm     Heart Rate (Exercise)  118 bpm  132 bpm  118 bpm     Heart Rate (Exit)  87 bpm  80 bpm  91 bpm     Oxygen Saturation (Admit)  91 %  92 %  92 %     Oxygen Saturation (Exercise)  95 %  90 %  94 %     Oxygen Saturation (Exit)  96 %  95 %  98 %     Rating of Perceived Exertion (Exercise)  _0 Perceived Dyspnea (Exercise)  _1 Duration  Continue with 45 min of aerobic exercise without signs/symptoms of physical distress.  Continue with 45 min of aerobic exercise without signs/symptoms of physical distress.  Continue with 45 min of aerobic exercise without signs/symptoms of physical distress.     Intensity  THRR unchanged  THRR unchanged  THRR unchanged       Resistance Training   Training Prescription  Yes  Yes  Yes     Weight  blue bands  blue bands  blue bands     Reps  10-15  10-15  10-15     Time  -  -  17 Minutes       Interval Training   Interval Training  -  -  No        Oxygen   Oxygen  Continuous  Continuous  Continuous     Liters  _2 Recumbant Bike   Level  _3 Watts  _4 Minutes  _5 NuStep   Level  -  3  2     SPM  -  80  80     Minutes  -  58  17 has been out 2 weeks, level decreased     METs  -  2.3  1.9       Track   Laps  12  -  2     Minutes  17  -  -        Exercise Comments:   Exercise Goals and Review:  Exercise Goals    Row Name 07/28/18 1030             Exercise Goals   Increase Physical Activity  Yes       Intervention  Provide advice, education, support and counseling about physical activity/exercise needs.;Develop an individualized exercise prescription for aerobic and resistive training based on initial evaluation findings, risk stratification, comorbidities and participant's personal goals.       Expected Outcomes  Short Term: Attend rehab on a regular basis to increase amount of physical activity.;Long Term: Add in home exercise to make exercise part of routine and to increase amount of physical activity.;Long Term: Exercising regularly at least 3-5 days a week.       Increase Strength and Stamina  Yes       Intervention  Provide advice, education, support and counseling about physical activity/exercise needs.;Develop an individualized exercise prescription for aerobic and resistive training based on initial evaluation findings, risk stratification, comorbidities and participant's personal goals.       Expected Outcomes  Short Term: Increase workloads from initial exercise prescription for resistance, speed, and METs.;Short Term: Perform resistance training exercises routinely during rehab and add in resistance training at home;Long Term: Improve cardiorespiratory fitness, muscular endurance and strength as measured by increased METs and functional capacity (6MWT)       Able to understand and use rate of perceived exertion (RPE) scale  Yes       Intervention  Provide  education  and explanation on how to use RPE scale       Expected Outcomes  Short Term: Able to use RPE daily in rehab to express subjective intensity level;Long Term:  Able to use RPE to guide intensity level when exercising independently       Able to understand and use Dyspnea scale  Yes       Intervention  Provide education and explanation on how to use Dyspnea scale       Expected Outcomes  Short Term: Able to use Dyspnea scale daily in rehab to express subjective sense of shortness of breath during exertion;Long Term: Able to use Dyspnea scale to guide intensity level when exercising independently       Knowledge and understanding of Target Heart Rate Range (THRR)  Yes       Intervention  Provide education and explanation of THRR including how the numbers were predicted and where they are located for reference       Expected Outcomes  Short Term: Able to state/look up THRR;Long Term: Able to use THRR to govern intensity when exercising independently;Short Term: Able to use daily as guideline for intensity in rehab       Understanding of Exercise Prescription  Yes       Intervention  Provide education, explanation, and written materials on patient's individual exercise prescription       Expected Outcomes  Short Term: Able to explain program exercise prescription;Long Term: Able to explain home exercise prescription to exercise independently          Exercise Goals Re-Evaluation : Exercise Goals Re-Evaluation    Row Name 08/31/18 0745 09/27/18 0955           Exercise Goal Re-Evaluation   Exercise Goals Review  Increase Physical Activity;Increase Strength and Stamina;Able to understand and use rate of perceived exertion (RPE) scale;Able to understand and use Dyspnea scale;Knowledge and understanding of Target Heart Rate Range (THRR);Understanding of Exercise Prescription  Increase Physical Activity;Increase Strength and Stamina;Able to understand and use rate of perceived exertion (RPE)  scale;Able to understand and use Dyspnea scale;Knowledge and understanding of Target Heart Rate Range (THRR);Understanding of Exercise Prescription      Comments  Patient is eager to progress--however we are having to titrate her to high liter flows. Patients MET average is 2.3. Has only attended 5 rehab sessions. Will cont. to monitor and progress as able.  Pt has injured her shoulder and has been absent, having only came once in the past month. Her arm was in a sling. When pt returns from injury I will access the situation on report on it.       Expected Outcomes  Through rehab and exercising at home, patient will find it easier to carry out ADL's and will gain the confidence to establish an exercise routine at home.   Through rehab and exercising at home, patient will find it easier to carry out ADL's and will gain the confidence to establish an exercise routine at home.          Discharge Exercise Prescription (Final Exercise Prescription Changes): Exercise Prescription Changes - 09/12/18 1500      Response to Exercise   Blood Pressure (Admit)  114/60    Blood Pressure (Exercise)  128/60    Blood Pressure (Exit)  100/60    Heart Rate (Admit)  90 bpm    Heart Rate (Exercise)  118 bpm    Heart Rate (Exit)  91 bpm    Oxygen Saturation (  Admit)  92 %    Oxygen Saturation (Exercise)  94 %    Oxygen Saturation (Exit)  98 %    Rating of Perceived Exertion (Exercise)  15    Perceived Dyspnea (Exercise)  3    Duration  Continue with 45 min of aerobic exercise without signs/symptoms of physical distress.    Intensity  THRR unchanged      Resistance Training   Training Prescription  Yes    Weight  blue bands    Reps  10-15    Time  17 Minutes      Interval Training   Interval Training  No      Oxygen   Oxygen  Continuous    Liters  10      Recumbant Bike   Level  3    Watts  10    Minutes  17      NuStep   Level  2    SPM  80    Minutes  17   has been out 2 weeks, level  decreased   METs  1.9      Track   Laps  2       Nutrition:  Target Goals: Understanding of nutrition guidelines, daily intake of sodium <1551m, cholesterol <2043m calories 30% from fat and 7% or less from saturated fats, daily to have 5 or more servings of fruits and vegetables.  Biometrics:    Nutrition Therapy Plan and Nutrition Goals: Nutrition Therapy & Goals - 07/31/18 1442      Nutrition Therapy   Diet  heart healthy      Personal Nutrition Goals   Nutrition Goal  Pt to identify and limit food sources of sodium,saturated fats, trans fats and refined carbohydrates    Personal Goal #2  Identify food quantities necessary to achieve wt loss of  -2# per week to a goal wt loss of 2.7-10.9 kg (6-24 lb) at graduation from pulmonary rehab.      Intervention Plan   Intervention  Prescribe, educate and counsel regarding individualized specific dietary modifications aiming towards targeted core components such as weight, hypertension, lipid management, diabetes, heart failure and other comorbidities.    Expected Outcomes  Short Term Goal: Understand basic principles of dietary content, such as calories, fat, sodium, cholesterol and nutrients.;Long Term Goal: Adherence to prescribed nutrition plan.       Nutrition Assessments: Nutrition Assessments - 07/31/18 1442      Rate Your Plate Scores   Pre Score  42      MEDFICTS Scores   Pre Score  --       Nutrition Goals Re-Evaluation:   Nutrition Goals Discharge (Final Nutrition Goals Re-Evaluation):   Psychosocial: Target Goals: Acknowledge presence or absence of significant depression and/or stress, maximize coping skills, provide positive support system. Participant is able to verbalize types and ability to use techniques and skills needed for reducing stress and depression.  Initial Review & Psychosocial Screening: Initial Psych Review & Screening - 07/28/18 1038      Initial Review   Current issues with  Current  Depression;History of Depression;Current Stress Concerns    Source of Stress Concerns  Chronic Illness;Unable to perform yard/household activities;Unable to participate in former interests or hobbies    Comments  Pt with history and current history of depression.  Pt would like to seek counsel with staff in the [facility] Yes  Comments  husband       Barriers   Psychosocial barriers to participate in program  The patient should benefit from training in stress management and relaxation.      Screening Interventions   Interventions  Encouraged to exercise;To provide support and resources with identified psychosocial needs    Expected Outcomes  Short Term goal: Identification and review with participant of any Quality of Life or Depression concerns found by scoring the questionnaire.;Long Term goal: The participant improves quality of Life and PHQ9 Scores as seen by post scores and/or verbalization of changes;Short Term goal: Utilizing psychosocial counselor, staff and physician to assist with identification of specific Stressors or current issues interfering with healing process. Setting desired goal for each stressor or current issue identified.;Long Term Goal: Stressors or current issues are controlled or eliminated.       Quality of Life Scores:  Scores of 19 and below usually indicate a poorer quality of life in these areas.  A difference of  2-3 points is a clinically meaningful difference.  A difference of 2-3 points in the total score of the Quality of Life Index has been associated with significant improvement in overall quality of life, self-image, physical symptoms, and general health in studies assessing change in quality of life.  PHQ-9: Recent Review Flowsheet Data    Depression screen [facility] 2/9 07/28/2018   Decreased Interest 3   Down, Depressed, Hopeless 2   PHQ - 2 Score 5   Altered sleeping 0   Tired, decreased  energy 3   Change in appetite 3   Feeling bad or failure about yourself  2   Trouble concentrating 2   Moving slowly or fidgety/restless 3   Suicidal thoughts 0   PHQ-9 Score 18   Difficult doing work/chores Very difficult     Interpretation of Total Score  Total Score Depression Severity:  1-4 = Minimal depression, 5-9 = Mild depression, 10-14 = Moderate depression, 15-19 = Moderately severe depression, 20-27 = Severe depression   Psychosocial Evaluation and Intervention: Psychosocial Evaluation - 09/27/18 1529      Psychosocial Evaluation & Interventions   Interventions  Stress management education;Relaxation education;Encouraged to exercise with the program and follow exercise prescription    Comments  last exercised on 10/29.  pt with ER visit for [place] on 11/9.    Expected Outcomes  Pt will seek out resources when she feels overwhelmed, stressed and depressed.    Continue Psychosocial Services   Follow up required by staff       Psychosocial Re-Evaluation: Psychosocial Re-Evaluation    [place] Name 08/31/18 0949 09/27/18 1530           Psychosocial Re-Evaluation   Current issues with  Current Stress Concerns;Current Depression;History of Depression  -      Comments  Pt has multiple issues as it relates to the chronic nautre of her lung disease process.  Pt feels supported by her husband who has assumed a majority of the duties within the household. This at times for pt is more of stress than a help.  Pt has multiple issues as it relates to the chronic nautre of her lung disease process.  Pt feels supported by her husband who has assumed a majority of the duties within the household. This at times for pt is more of stress than a help.      Interventions  Stress management education;Encouraged to attend Pulmonary Rehabilitation for the exercise;Relaxation education  Stress management education;Encouraged to attend Pulmonary  Rehabilitation for the exercise;Relaxation education       Continue Psychosocial Services   Follow up required by staff  Follow up required by staff        Initial Review   Source of Stress Concerns  -  Chronic Illness;Unable to perform yard/household activities;Unable to participate in former interests or hobbies         Psychosocial Discharge (Final Psychosocial Re-Evaluation): Psychosocial Re-Evaluation - 09/27/18 1530      Psychosocial Re-Evaluation   Comments  Pt has multiple issues as it relates to the chronic nautre of her lung disease process.  Pt feels supported by her husband who has assumed a majority of the duties within the household. This at times for pt is more of stress than a help.    Interventions  Stress management education;Encouraged to attend Pulmonary Rehabilitation for the exercise;Relaxation education    Continue Psychosocial Services   Follow up required by staff      Initial Review   Source of Stress Concerns  Chronic Illness;Unable to perform yard/household activities;Unable to participate in former interests or hobbies       Education: Education Goals: Education classes will be provided on a weekly basis, covering required topics. Participant will state understanding/return demonstration of topics presented.  Learning Barriers/Preferences: Learning Barriers/Preferences - 07/28/18 1040      Learning Barriers/Preferences   Learning Barriers  Sight    Learning Preferences  Computer/Internet;Group Instruction;Individual Instruction;Skilled Demonstration;Verbal Instruction;Written Material       Education Topics: Risk Factor Reduction:  -Group instruction that is supported by a PowerPoint presentation. Instructor discusses the definition of a risk factor, different risk factors for pulmonary disease, and how the heart and lungs work together.     Nutrition for Pulmonary Patient:  -Group instruction provided by PowerPoint slides, verbal discussion, and written materials to support subject matter. The instructor  gives an explanation and review of healthy diet recommendations, which includes a discussion on weight management, recommendations for fruit and vegetable consumption, as well as protein, fluid, caffeine, fiber, sodium, sugar, and alcohol. Tips for eating when patients are short of breath are discussed.   Pursed Lip Breathing:  -Group instruction that is supported by demonstration and informational handouts. Instructor discusses the benefits of pursed lip and diaphragmatic breathing and detailed demonstration on how to preform both.     Oxygen Safety:  -Group instruction provided by PowerPoint, verbal discussion, and written material to support subject matter. There is an overview of "What is Oxygen" and "Why do we need it".  Instructor also reviews how to create a safe environment for oxygen use, the importance of using oxygen as prescribed, and the risks of noncompliance. There is a brief discussion on traveling with oxygen and resources the patient may utilize.   PULMONARY REHAB OTHER RESPIRATORY from 08/24/2018 in [place]  Date  08/17/18  Educator  Cloyde Reams  Instruction Review Code  1- Verbalizes Understanding      Oxygen Equipment:  -Group instruction provided by [employer] utilizing handouts, written materials, and Insurance underwriter.   PULMONARY REHAB OTHER RESPIRATORY from 08/24/2018 in [place]  Date  08/24/18  Educator  [employer]  Instruction Review Code  1- Verbalizes Understanding      Signs and Symptoms:  -Group instruction provided by written material and verbal discussion to support subject matter. Warning signs and symptoms of infection, stroke, and heart attack are reviewed and when to call the physician/911 reinforced. Tips  for preventing the spread of infection discussed.   PULMONARY REHAB OTHER RESPIRATORY from 08/24/2018 in [place]  Date  08/03/18  Educator   [facility]  Instruction Review Code  1- Verbalizes Understanding      Advanced Directives:  -Group instruction provided by verbal instruction and written material to support subject matter. Instructor reviews Advanced Directive laws and proper instruction for filling out document.   Pulmonary Video:  -Group video education that reviews the importance of medication and oxygen compliance, exercise, good nutrition, pulmonary hygiene, and pursed lip and diaphragmatic breathing for the pulmonary patient.   Exercise for the Pulmonary Patient:  -Group instruction that is supported by a PowerPoint presentation. Instructor discusses benefits of exercise, core components of exercise, frequency, duration, and intensity of an exercise routine, importance of utilizing pulse oximetry during exercise, safety while exercising, and options of places to exercise outside of rehab.     Pulmonary Medications:  -Verbally interactive group education provided by instructor with focus on inhaled medications and proper administration.   Anatomy and Physiology of the Respiratory System and Intimacy:  -Group instruction provided by PowerPoint, verbal discussion, and written material to support subject matter. Instructor reviews respiratory cycle and anatomical components of the respiratory system and their functions. Instructor also reviews differences in obstructive and restrictive respiratory diseases with examples of each. Intimacy, Sex, and Sexuality differences are reviewed with a discussion on how relationships can change when diagnosed with pulmonary disease. Common sexual concerns are reviewed.   MD DAY -A group question and answer session with a medical doctor that allows participants to ask questions that relate to their pulmonary disease state.   OTHER EDUCATION -Group or individual verbal, written, or video instructions that support the educational goals of the pulmonary rehab program.   Holiday Eating  Survival Tips:  -Group instruction provided by PowerPoint slides, verbal discussion, and written materials to support subject matter. The instructor gives patients tips, tricks, and techniques to help them not only survive but enjoy the holidays despite the onslaught of food that accompanies the holidays.   Knowledge Questionnaire Score: Knowledge Questionnaire Score - 08/02/18 1515      Knowledge Questionnaire Score   Pre Score  14/18       Core Components/Risk Factors/Patient Goals at Admission: Personal Goals and Risk Factors at Admission - 07/28/18 1041      Core Components/Risk Factors/Patient Goals on Admission    Weight Management  Yes;Weight Loss    Admit Weight  210 lb 5.1 oz (95.4 kg)    Goal Weight: Short Term  195 lb (88.5 kg)    Goal Weight: Long Term  175 lb (79.4 kg)    Expected Outcomes  Short Term: Continue to assess and modify interventions until short term weight is achieved;Long Term: Adherence to nutrition and physical activity/exercise program aimed toward attainment of established weight goal;Weight Loss: Understanding of general recommendations for a balanced deficit meal plan, which promotes 1-2 lb weight loss per week and includes a negative energy balance of [phone] kcal/d;Understanding recommendations for meals to include 15-35% energy as protein, 25-35% energy from fat, 35-60% energy from carbohydrates, less than 269m of dietary cholesterol, 20-35 gm of total fiber daily;Understanding of distribution of calorie intake throughout the day with the consumption of 4-5 meals/snacks    Improve shortness of breath with ADL's  Yes    Intervention  Provide education, individualized exercise plan and daily activity instruction to help decrease symptoms of SOB with activities of daily  living.    Expected Outcomes  Short Term: Improve cardiorespiratory fitness to achieve a reduction of symptoms when performing ADLs;Long Term: Be able to perform more ADLs without symptoms or  delay the onset of symptoms    Lipids  Yes    Intervention  Provide education and support for participant on nutrition & aerobic/resistive exercise along with prescribed medications to achieve LDL <55m, HDL >481m    Expected Outcomes  Short Term: Participant states understanding of desired cholesterol values and is compliant with medications prescribed. Participant is following exercise prescription and nutrition guidelines.;Long Term: Cholesterol controlled with medications as prescribed, with individualized exercise RX and with personalized nutrition plan. Value goals: LDL < 7034mHDL > 40 mg.    Stress  Yes    Intervention  Offer individual and/or small group education and counseling on adjustment to heart disease, stress management and health-related lifestyle change. Teach and support self-help strategies.;Refer participants experiencing significant psychosocial distress to appropriate mental health specialists for further evaluation and treatment. When possible, include family members and significant others in education/counseling sessions.    Expected Outcomes  Short Term: Participant demonstrates changes in health-related behavior, relaxation and other stress management skills, ability to obtain effective social support, and compliance with psychotropic medications if prescribed.;Long Term: Emotional wellbeing is indicated by absence of clinically significant psychosocial distress or social isolation.    Personal Goal Other  Yes    Personal Goal  Pt would like become physically conditioned that she no longer requires as much or no oxygen    Intervention  Proper breathing techniques such as deep breathing and purse lip breathing    Expected Outcomes  Pt will report not needing as much oxygen with acceptable range for oxygen saturation and no shortness of breath       Core Components/Risk Factors/Patient Goals Review:  Goals and Risk Factor Review    Row Name 08/31/18 0959 08/31/18 1000  09/27/18 1531         Core Components/Risk Factors/Patient Goals Review   Personal Goals Review  Weight Management/Obesity;Develop more efficient breathing techniques such as purse lipped breathing and diaphragmatic breathing and practicing self-pacing with activity.;Stress;Increase knowledge of respiratory medications and ability to use respiratory devices properly.;Improve shortness of breath with ADL's;Lipids  Weight Management/Obesity;Develop more efficient breathing techniques such as purse lipped breathing and diaphragmatic breathing and practicing self-pacing with activity.;Stress;Increase knowledge of respiratory medications and ability to use respiratory devices properly.;Improve shortness of breath with ADL's;Lipids  Weight Management/Obesity;Develop more efficient breathing techniques such as purse lipped breathing and diaphragmatic breathing and practicing self-pacing with activity.;Stress;Increase knowledge of respiratory medications and ability to use respiratory devices properly.;Improve shortness of breath with ADL's;Lipids     Review  -  pt completed 5 exercise sessions and 2 education classes.  Pt weight shows an increase of 2 kg since 9/24. Pt will meet with the dietician to discuss meal plan and strategies to achieve desired weight loss.  Pt rates dyspnea of 2. Pt does well with PLB with verbal cues and reminders.  Continue to coach pt on breathing techniques.  Pt desires to come off of her oxygen however pt requires 8 -10L with exercise.  Unsure if this is a realistic goal for this pt.  Pt stress level remains the same due to her inability to perform household chores and having her husband do the work for her.  Talked with pt on gradually adding smaller task in as she is able to to.  Pt workloads increase to level  3 on the recumbent bike, nustep level 3  and 2.2/0 on the treamill.  Continue to monitor pt progress during the next 30 day assessment.  pt completed 6 exercise sessions and 3  education classes.  Pt last exercised on 10/29.  pt with ER visit on 11/9 for [place].  No follow up appt with Dr. Elsworth Soho.  Pt weight shows a decrease of 4.8kg since 9/24.   Pt rates dyspnea of 2. Pt does well with PLB with verbal cues and reminders.  Continue to coach pt on breathing techniques.  Pt desires to come off of her oxygen however pt requires 8 -10L with exercise.  Unsure if this is a realistic goal for this pt.  Pt stress level remains the same due to her inability to perform household chores and having her husband do the work for her.Pt with recent fall at the mailbox.  Pt did not wear her oxygen and passed out.  As a result pt fractured her elbow. This has further impacted her ability to contribute to the household.  Pt dominant arm is in a sling.    Pt workloads increase to level 3 on the recumbent bike, nustep level decrease level 2 due to arm in sling  and switched to track verses treamill due to not being able to use arm to hold on for the treadmill. Continue to monitor pt progress during the next 30 day assessment.     Expected Outcomes  -  See Admission Goals/Outcomes  See Admission Goals/Outcomes        Core Components/Risk Factors/Patient Goals at Discharge (Final Review):  Goals and Risk Factor Review - 09/27/18 1531      Core Components/Risk Factors/Patient Goals Review   Personal Goals Review  Weight Management/Obesity;Develop more efficient breathing techniques such as purse lipped breathing and diaphragmatic breathing and practicing self-pacing with activity.;Stress;Increase knowledge of respiratory medications and ability to use respiratory devices properly.;Improve shortness of breath with ADL's;Lipids    Review  pt completed 6 exercise sessions and 3 education classes.  Pt last exercised on 10/29.  pt with ER visit on 11/9 for [place].  No follow up appt with Dr. Elsworth Soho.  Pt weight shows a decrease of 4.8kg since 9/24.   Pt rates dyspnea of 2. Pt does well with PLB with verbal  cues and reminders.  Continue to coach pt on breathing techniques.  Pt desires to come off of her oxygen however pt requires 8 -10L with exercise.  Unsure if this is a realistic goal for this pt.  Pt stress level remains the same due to her inability to perform household chores and having her husband do the work for her.Pt with recent fall at the mailbox.  Pt did not wear her oxygen and passed out.  As a result pt fractured her elbow. This has further impacted her ability to contribute to the household.  Pt dominant arm is in a sling.    Pt workloads increase to level 3 on the recumbent bike, nustep level decrease level 2 due to arm in sling  and switched to track verses treamill due to not being able to use arm to hold on for the treadmill. Continue to monitor pt progress during the next 30 day assessment.    Expected Outcomes  See Admission Goals/Outcomes       ITP Comments: ITP Comments    Row Name 07/28/18 1029 08/31/18 0946 09/27/18 1521       ITP Comments  Dr. Manfred Arch, Medical  Director Pulmonary  Dr. Manfred Arch, Medical Director Pulmonary  Dr. Manfred Arch, Medical Director Pulmonary        Comments: Pt has completed 6 exercise sessions. Last exercised on 10/29.  Continue to monitor pt progress during next 30 day assessment. Cherre Huger, BSN Cardiac and Training and development officer

## 2018-09-28 ENCOUNTER — Encounter (HOSPITAL_COMMUNITY): Payer: 59

## 2018-09-28 DIAGNOSIS — J309 Allergic rhinitis, unspecified: Secondary | ICD-10-CM | POA: Diagnosis not present

## 2018-09-29 IMAGING — DX DG CHEST 2V
2 series · 2 of 2 positions shown · non-contrast
Comparison: None.

CLINICAL DATA: Shortness of breath for several days

EXAM:
CHEST  2 VIEW

[w chest pa]
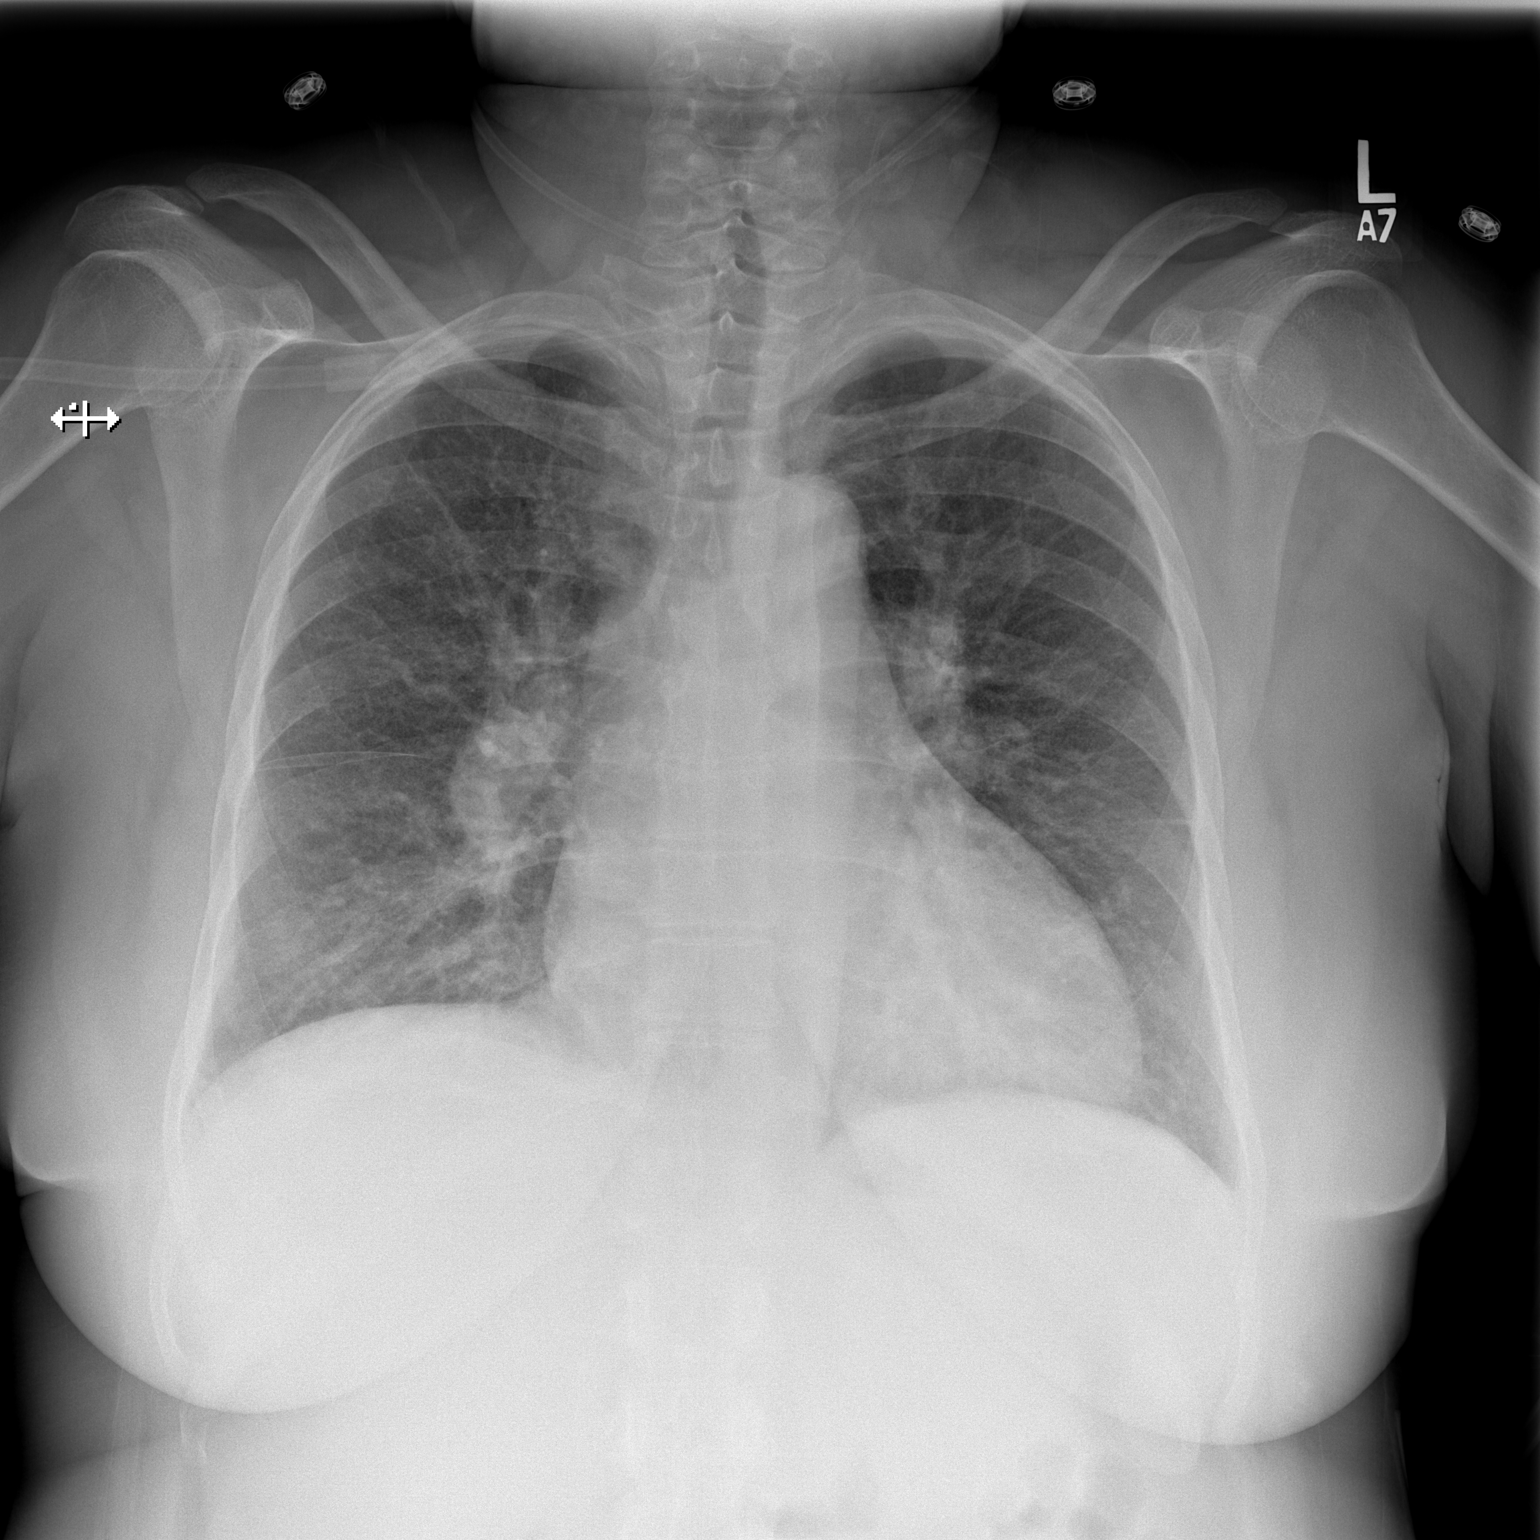

[w chest lat]
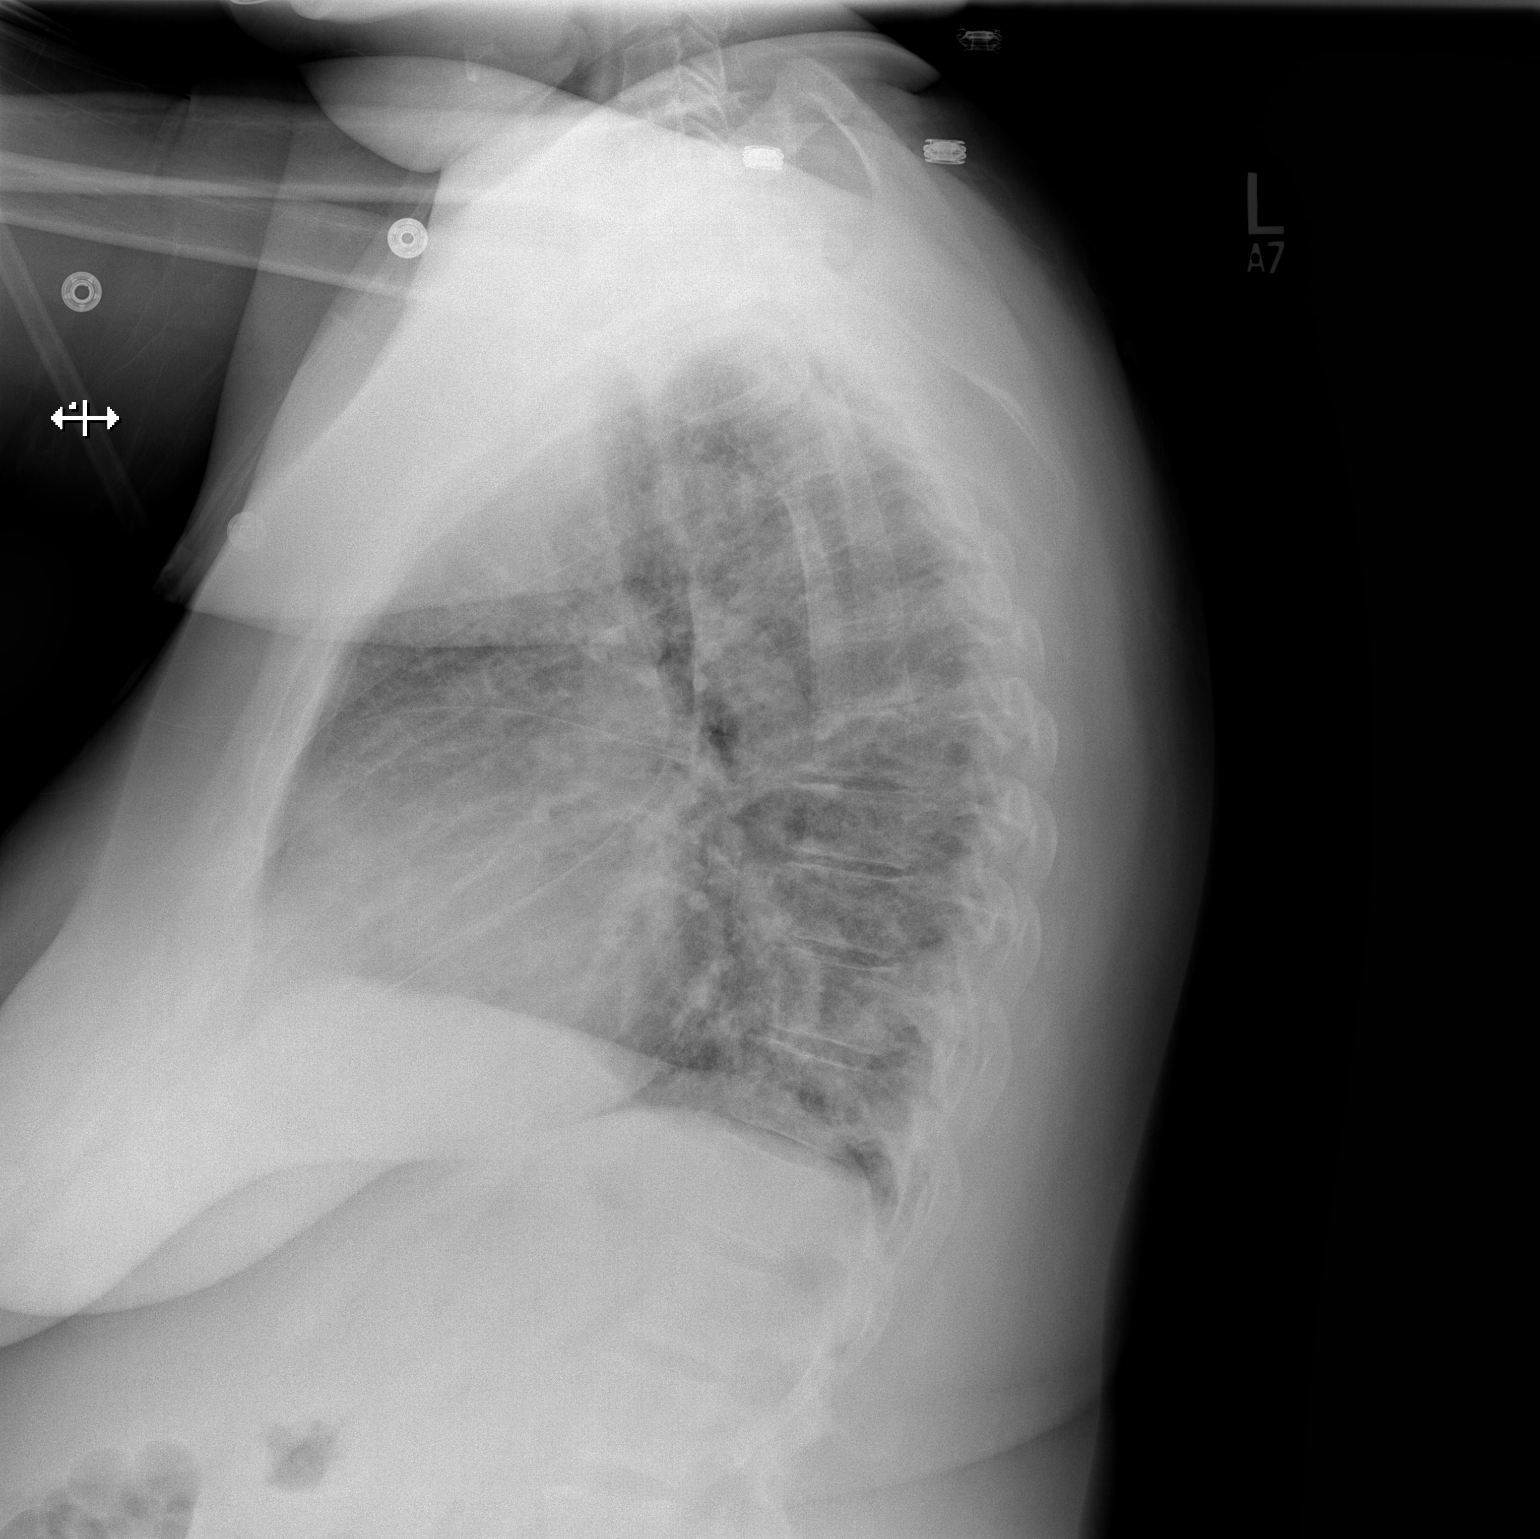

[2 of 2 positions shown; findings below may reference images not displayed]

FINDINGS: Cardiac shadow is at the upper limits of normal in size. Mild
central vascular congestion is noted with very minimal interstitial
edema. No sizable effusion or focal infiltrate is noted. No bony
abnormality is seen.
IMPRESSION: Changes of mild CHF.

## 2018-10-03 ENCOUNTER — Encounter (HOSPITAL_COMMUNITY): Payer: 59

## 2018-10-05 ENCOUNTER — Encounter (HOSPITAL_COMMUNITY): Payer: 59

## 2018-10-05 ENCOUNTER — Telehealth (HOSPITAL_COMMUNITY): Payer: Self-pay | Admitting: *Deleted

## 2018-10-06 DIAGNOSIS — J9611 Chronic respiratory failure with hypoxia: Secondary | ICD-10-CM | POA: Diagnosis not present

## 2018-10-06 DIAGNOSIS — J449 Chronic obstructive pulmonary disease, unspecified: Secondary | ICD-10-CM | POA: Diagnosis not present

## 2018-10-06 DIAGNOSIS — J441 Chronic obstructive pulmonary disease with (acute) exacerbation: Secondary | ICD-10-CM | POA: Diagnosis not present

## 2018-10-10 ENCOUNTER — Encounter (HOSPITAL_COMMUNITY): Payer: 59

## 2018-10-17 ENCOUNTER — Encounter (HOSPITAL_COMMUNITY): Payer: 59

## 2018-10-19 ENCOUNTER — Telehealth (HOSPITAL_COMMUNITY): Payer: Self-pay | Admitting: *Deleted

## 2018-10-19 ENCOUNTER — Encounter (HOSPITAL_COMMUNITY): Payer: 59

## 2018-10-19 ENCOUNTER — Telehealth (HOSPITAL_COMMUNITY): Payer: Self-pay

## 2018-10-20 NOTE — Addendum Note (Signed)
Encounter addended by: Ivonne Andrew, RD on: 10/20/2018 10:28 AM  Actions taken: Flowsheet data copied forward, Visit Navigator Flowsheet section accepted

## 2018-10-24 ENCOUNTER — Encounter (HOSPITAL_COMMUNITY): Payer: 59

## 2018-10-26 ENCOUNTER — Other Ambulatory Visit: Payer: Self-pay | Admitting: Rheumatology

## 2018-10-26 ENCOUNTER — Encounter (HOSPITAL_COMMUNITY): Payer: 59

## 2018-10-26 NOTE — Telephone Encounter (Signed)
Last visit:06/05/18 Next visit:12/06/18  Okay to refill per Dr. Estanislado Pandy

## 2018-10-31 ENCOUNTER — Encounter (HOSPITAL_COMMUNITY): Payer: 59

## 2018-11-01 ENCOUNTER — Other Ambulatory Visit: Payer: Self-pay | Admitting: Rheumatology

## 2018-11-01 NOTE — Telephone Encounter (Signed)
Last visit:06/05/18 Next visit:12/06/18  Okay to refill per Dr. Estanislado Pandy

## 2018-11-02 ENCOUNTER — Encounter (HOSPITAL_COMMUNITY): Payer: 59

## 2018-11-05 DIAGNOSIS — J9611 Chronic respiratory failure with hypoxia: Secondary | ICD-10-CM | POA: Diagnosis not present

## 2018-11-05 DIAGNOSIS — J449 Chronic obstructive pulmonary disease, unspecified: Secondary | ICD-10-CM | POA: Diagnosis not present

## 2018-11-05 DIAGNOSIS — J441 Chronic obstructive pulmonary disease with (acute) exacerbation: Secondary | ICD-10-CM | POA: Diagnosis not present

## 2018-11-07 ENCOUNTER — Encounter (HOSPITAL_COMMUNITY): Payer: 59

## 2018-11-09 ENCOUNTER — Encounter (HOSPITAL_COMMUNITY): Payer: 59

## 2018-11-09 NOTE — Addendum Note (Signed)
Encounter addended by: Rowe Pavy, RN on: 11/09/2018 4:29 PM  Actions taken: Episode resolved, Flowsheet data copied forward, Clinical Note Signed, Visit Navigator Flowsheet section accepted

## 2018-11-09 NOTE — Progress Notes (Signed)
Discharge Progress Report  Patient Details  Name: Teresa Roberts MRN: [id number] Date of Birth: [birth year] Referring Provider:     Pulmonary Rehab Walk Test from 08/01/2018 in [facility]  Referring Provider  Dr. Elsworth Soho       Number of Visits: 6  Reason for Discharge:  Early Exit:  inability to return to exercise after fracturing her elbow during a fall  Smoking History:  Social History   Tobacco Use  Smoking Status Former Smoker  . Packs/day: 1.00  . Years: 39.00  . Pack years: 39.00  . Types: Cigarettes  . Start date: 07/21/2018  Smokeless Tobacco Never Used  Tobacco Comment   resumed smoking less than a pack a day    Diagnosis:  Chronic respiratory failure with hypoxia ([place])  ADL UCSD: Pulmonary Assessment Scores    Row Name 08/02/18 1515 08/03/18 0748       ADL UCSD   ADL Phase  Entry  Entry    SOB Score total  105  -      CAT Score   CAT Score  30  -      mMRC Score   mMRC Score  -  2       Initial Exercise Prescription: Initial Exercise Prescription - 08/03/18 0700      Date of Initial Exercise RX and Referring Provider   Date  08/03/18    Referring Provider  Dr. Elsworth Soho      Oxygen   Oxygen  Continuous    Liters  10      Recumbant Bike   Level  2    Watts  10    Minutes  17      NuStep   Level  2    SPM  80    Minutes  17    METs  1.5      Track   Laps  10    Minutes  17      Prescription Details   Frequency (times per week)  2    Duration  Progress to 45 minutes of aerobic exercise without signs/symptoms of physical distress      Intensity   THRR 40-80% of Max Heartrate  66-132    Ratings of Perceived Exertion  11-13    Perceived Dyspnea  0-4      Progression   Progression  Continue to progress workloads to maintain intensity without signs/symptoms of physical distress.      Resistance Training   Training Prescription  Yes    Weight  blue bands    Reps  10-15       Discharge Exercise  Prescription (Final Exercise Prescription Changes): Exercise Prescription Changes - 09/12/18 1500      Response to Exercise   Blood Pressure (Admit)  114/60    Blood Pressure (Exercise)  128/60    Blood Pressure (Exit)  100/60    Heart Rate (Admit)  90 bpm    Heart Rate (Exercise)  118 bpm    Heart Rate (Exit)  91 bpm    Oxygen Saturation (Admit)  92 %    Oxygen Saturation (Exercise)  94 %    Oxygen Saturation (Exit)  98 %    Rating of Perceived Exertion (Exercise)  15    Perceived Dyspnea (Exercise)  3    Duration  Continue with 45 min of aerobic exercise without signs/symptoms of physical distress.    Intensity  THRR unchanged      Resistance  Training   Training Prescription  Yes    Weight  blue bands    Reps  10-15    Time  17 Minutes      Interval Training   Interval Training  No      Oxygen   Oxygen  Continuous    Liters  10      Recumbant Bike   Level  3    Watts  10    Minutes  17      NuStep   Level  2    SPM  80    Minutes  17   has been out 2 weeks, level decreased   METs  1.9      Track   Laps  2       Functional Capacity: 6 Minute Walk    Row Name 08/03/18 0744         6 Minute Walk   Phase  Initial     Distance  800 feet     Walk Time  - 5 minutes     # of Rest Breaks  1 stopped by Ep due to desats     MPH  1.5     METS  2.15     RPE  12     Perceived Dyspnea   1     Comments  used wheelchair/slow recovery/forhead probe/requested oxymizer     Resting HR  89 bpm     Resting BP  128/80     Resting Oxygen Saturation   97 %     Exercise Oxygen Saturation  during 6 min walk  81 %     Max Ex. HR  110 bpm     Max Ex. BP  140/80       Interval HR   1 Minute HR  - would not read on pulse ox     2 Minute HR  - placed forhead probe     3 Minute HR  106     4 Minute HR  106     5 Minute HR  110     6 Minute HR  110     2 Minute Post HR  98     Interval Heart Rate?  Yes       Interval Oxygen   Interval Oxygen?  Yes     Baseline Oxygen  Saturation %  97 %     1 Minute Liters of Oxygen  4 L     2 Minute Liters of Oxygen  4 L     3 Minute Oxygen Saturation %  81 %     3 Minute Liters of Oxygen  4 L     4 Minute Oxygen Saturation %  82 %     4 Minute Liters of Oxygen  8 L     5 Minute Oxygen Saturation %  87 %     5 Minute Liters of Oxygen  10 L     6 Minute Oxygen Saturation %  85 %     6 Minute Liters of Oxygen  10 L     2 Minute Post Oxygen Saturation %  88 %     2 Minute Post Liters of Oxygen  10 L        Psychological, QOL, Others - Outcomes: PHQ 2/9: Depression screen PHQ 2/9 07/28/2018  Decreased Interest 3  Down, Depressed, Hopeless 2  PHQ - 2 Score 5  Altered sleeping 0  Tired,  decreased energy 3  Change in appetite 3  Feeling bad or failure about yourself  2  Trouble concentrating 2  Moving slowly or fidgety/restless 3  Suicidal thoughts 0  PHQ-9 Score 18  Difficult doing work/chores Very difficult    Quality of Life:   Personal Goals: Goals established at orientation with interventions provided to work toward goal. Personal Goals and Risk Factors at Admission - 07/28/18 1041      Core Components/Risk Factors/Patient Goals on Admission    Weight Management  Yes;Weight Loss    Admit Weight  210 lb 5.1 oz (95.4 kg)    Goal Weight: Short Term  195 lb (88.5 kg)    Goal Weight: Long Term  175 lb (79.4 kg)    Expected Outcomes  Short Term: Continue to assess and modify interventions until short term weight is achieved;Long Term: Adherence to nutrition and physical activity/exercise program aimed toward attainment of established weight goal;Weight Loss: Understanding of general recommendations for a balanced deficit meal plan, which promotes 1-2 lb weight loss per week and includes a negative energy balance of [phone] kcal/d;Understanding recommendations for meals to include 15-35% energy as protein, 25-35% energy from fat, 35-60% energy from carbohydrates, less than 227m of dietary cholesterol, 20-35  gm of total fiber daily;Understanding of distribution of calorie intake throughout the day with the consumption of 4-5 meals/snacks    Improve shortness of breath with ADL's  Yes    Intervention  Provide education, individualized exercise plan and daily activity instruction to help decrease symptoms of SOB with activities of daily living.    Expected Outcomes  Short Term: Improve cardiorespiratory fitness to achieve a reduction of symptoms when performing ADLs;Long Term: Be able to perform more ADLs without symptoms or delay the onset of symptoms    Lipids  Yes    Intervention  Provide education and support for participant on nutrition & aerobic/resistive exercise along with prescribed medications to achieve LDL <726m HDL >4075m   Expected Outcomes  Short Term: Participant states understanding of desired cholesterol values and is compliant with medications prescribed. Participant is following exercise prescription and nutrition guidelines.;Long Term: Cholesterol controlled with medications as prescribed, with individualized exercise RX and with personalized nutrition plan. Value goals: LDL < 62m80mDL > 40 mg.    Stress  Yes    Intervention  Offer individual and/or small group education and counseling on adjustment to heart disease, stress management and health-related lifestyle change. Teach and support self-help strategies.;Refer participants experiencing significant psychosocial distress to appropriate mental health specialists for further evaluation and treatment. When possible, include family members and significant others in education/counseling sessions.    Expected Outcomes  Short Term: Participant demonstrates changes in health-related behavior, relaxation and other stress management skills, ability to obtain effective social support, and compliance with psychotropic medications if prescribed.;Long Term: Emotional wellbeing is indicated by absence of clinically significant psychosocial distress  or social isolation.    Personal Goal Other  Yes    Personal Goal  Pt would like become physically conditioned that she no longer requires as much or no oxygen    Intervention  Proper breathing techniques such as deep breathing and purse lip breathing    Expected Outcomes  Pt will report not needing as much oxygen with acceptable range for oxygen saturation and no shortness of breath        Personal Goals Discharge: Goals and Risk Factor Review    Row Name 08/31/18 0959 08/31/18 1000 09/27/18 1531 11/09/18 1617  Core Components/Risk Factors/Patient Goals Review   Personal Goals Review  Weight Management/Obesity;Develop more efficient breathing techniques such as purse lipped breathing and diaphragmatic breathing and practicing self-pacing with activity.;Stress;Increase knowledge of respiratory medications and ability to use respiratory devices properly.;Improve shortness of breath with ADL's;Lipids  Weight Management/Obesity;Develop more efficient breathing techniques such as purse lipped breathing and diaphragmatic breathing and practicing self-pacing with activity.;Stress;Increase knowledge of respiratory medications and ability to use respiratory devices properly.;Improve shortness of breath with ADL's;Lipids  Weight Management/Obesity;Develop more efficient breathing techniques such as purse lipped breathing and diaphragmatic breathing and practicing self-pacing with activity.;Stress;Increase knowledge of respiratory medications and ability to use respiratory devices properly.;Improve shortness of breath with ADL's;Lipids  Weight Management/Obesity;Develop more efficient breathing techniques such as purse lipped breathing and diaphragmatic breathing and practicing self-pacing with activity.;Stress;Increase knowledge of respiratory medications and ability to use respiratory devices properly.;Improve shortness of breath with ADL's;Lipids    Review  -  pt completed 5 exercise sessions and 2  education classes.  Pt weight shows an increase of 2 kg since 9/24. Pt will meet with the dietician to discuss meal plan and strategies to achieve desired weight loss.  Pt rates dyspnea of 2. Pt does well with PLB with verbal cues and reminders.  Continue to coach pt on breathing techniques.  Pt desires to come off of her oxygen however pt requires 8 -10L with exercise.  Unsure if this is a realistic goal for this pt.  Pt stress level remains the same due to her inability to perform household chores and having her husband do the work for her.  Talked with pt on gradually adding smaller task in as she is able to to.  Pt workloads increase to level 3 on the recumbent bike, nustep level 3  and 2.2/0 on the treamill.  Continue to monitor pt progress during the next 30 day assessment.  pt completed 6 exercise sessions and 3 education classes.  Pt last exercised on 10/29.  pt with ER visit on 11/9 for [place].  No follow up appt with Dr. Elsworth Soho.  Pt weight shows a decrease of 4.8kg since 9/24.   Pt rates dyspnea of 2. Pt does well with PLB with verbal cues and reminders.  Continue to coach pt on breathing techniques.  Pt desires to come off of her oxygen however pt requires 8 -10L with exercise.  Unsure if this is a realistic goal for this pt.  Pt stress level remains the same due to her inability to perform household chores and having her husband do the work for her.Pt with recent fall at the mailbox.  Pt did not wear her oxygen and passed out.  As a result pt fractured her elbow. This has further impacted her ability to contribute to the household.  Pt dominant arm is in a sling.    Pt workloads increase to level 3 on the recumbent bike, nustep level decrease level 2 due to arm in sling  and switched to track verses treamill due to not being able to use arm to hold on for the treadmill. Continue to monitor pt progress during the next 30 day assessment.  Pt discharged with the completion of 6 exercise sessions due to  recent fall she had at the mailbox and her elbow was fractured    Expected Outcomes  -  See Admission Goals/Outcomes  See Admission Goals/Outcomes  Pt made progress toward program and pt goals during her short participation       Exercise Goals  and Review: Exercise Goals    Row Name 07/28/18 1030             Exercise Goals   Increase Physical Activity  Yes       Intervention  Provide advice, education, support and counseling about physical activity/exercise needs.;Develop an individualized exercise prescription for aerobic and resistive training based on initial evaluation findings, risk stratification, comorbidities and participant's personal goals.       Expected Outcomes  Short Term: Attend rehab on a regular basis to increase amount of physical activity.;Long Term: Add in home exercise to make exercise part of routine and to increase amount of physical activity.;Long Term: Exercising regularly at least 3-5 days a week.       Increase Strength and Stamina  Yes       Intervention  Provide advice, education, support and counseling about physical activity/exercise needs.;Develop an individualized exercise prescription for aerobic and resistive training based on initial evaluation findings, risk stratification, comorbidities and participant's personal goals.       Expected Outcomes  Short Term: Increase workloads from initial exercise prescription for resistance, speed, and METs.;Short Term: Perform resistance training exercises routinely during rehab and add in resistance training at home;Long Term: Improve cardiorespiratory fitness, muscular endurance and strength as measured by increased METs and functional capacity (6MWT)       Able to understand and use rate of perceived exertion (RPE) scale  Yes       Intervention  Provide education and explanation on how to use RPE scale       Expected Outcomes  Short Term: Able to use RPE daily in rehab to express subjective intensity level;Long Term:   Able to use RPE to guide intensity level when exercising independently       Able to understand and use Dyspnea scale  Yes       Intervention  Provide education and explanation on how to use Dyspnea scale       Expected Outcomes  Short Term: Able to use Dyspnea scale daily in rehab to express subjective sense of shortness of breath during exertion;Long Term: Able to use Dyspnea scale to guide intensity level when exercising independently       Knowledge and understanding of Target Heart Rate Range (THRR)  Yes       Intervention  Provide education and explanation of THRR including how the numbers were predicted and where they are located for reference       Expected Outcomes  Short Term: Able to state/look up THRR;Long Term: Able to use THRR to govern intensity when exercising independently;Short Term: Able to use daily as guideline for intensity in rehab       Understanding of Exercise Prescription  Yes       Intervention  Provide education, explanation, and written materials on patient's individual exercise prescription       Expected Outcomes  Short Term: Able to explain program exercise prescription;Long Term: Able to explain home exercise prescription to exercise independently          Exercise Goals Re-Evaluation: Exercise Goals Re-Evaluation    Row Name 08/31/18 0745 09/27/18 0955           Exercise Goal Re-Evaluation   Exercise Goals Review  Increase Physical Activity;Increase Strength and Stamina;Able to understand and use rate of perceived exertion (RPE) scale;Able to understand and use Dyspnea scale;Knowledge and understanding of Target Heart Rate Range (THRR);Understanding of Exercise Prescription  Increase Physical Activity;Increase Strength and Stamina;Able to understand  and use rate of perceived exertion (RPE) scale;Able to understand and use Dyspnea scale;Knowledge and understanding of Target Heart Rate Range (THRR);Understanding of Exercise Prescription      Comments  Patient is  eager to progress--however we are having to titrate her to high liter flows. Patients MET average is 2.3. Has only attended 5 rehab sessions. Will cont. to monitor and progress as able.  Pt has injured her shoulder and has been absent, having only came once in the past month. Her arm was in a sling. When pt returns from injury I will access the situation on report on it.       Expected Outcomes  Through rehab and exercising at home, patient will find it easier to carry out ADL's and will gain the confidence to establish an exercise routine at home.   Through rehab and exercising at home, patient will find it easier to carry out ADL's and will gain the confidence to establish an exercise routine at home.          Nutrition & Weight - Outcomes:    Nutrition: Nutrition Therapy & Goals - 10/20/18 1026      Nutrition Therapy   Diet  heart healthy      Personal Nutrition Goals   Nutrition Goal  Pt to identify and limit food sources of sodium,saturated fats, trans fats and refined carbohydrates    Personal Goal #2  Identify food quantities necessary to achieve wt loss of  -2# per week to a goal wt loss of 2.7-10.9 kg (6-24 lb) at graduation from pulmonary rehab.      Intervention Plan   Intervention  Prescribe, educate and counsel regarding individualized specific dietary modifications aiming towards targeted core components such as weight, hypertension, lipid management, diabetes, heart failure and other comorbidities.    Expected Outcomes  Short Term Goal: Understand basic principles of dietary content, such as calories, fat, sodium, cholesterol and nutrients.;Long Term Goal: Adherence to prescribed nutrition plan.       Nutrition Discharge: Nutrition Assessments - 10/20/18 1026      Rate Your Plate Scores   Pre Score  42    Post Score  --   pt did not return survey      Education Questionnaire Score: Knowledge Questionnaire Score - 08/02/18 1515      Knowledge Questionnaire Score    Pre Score  14/18       Goals reviewed with patient. Cherre Huger, BSN Cardiac and Training and development officer

## 2018-11-23 NOTE — Progress Notes (Deleted)
Office Visit Note  Patient: Teresa Roberts             Date of Birth: [birth year]           MRN: [id number]             PCP: Jonathon Jordan, MD Referring: Jonathon Jordan, MD Visit Date: 12/06/2018 Occupation: @GUAROCC @  Subjective:  No chief complaint on file.   History of Present Illness: Teresa Roberts is a 58 y.o. female ***   Activities of Daily Living:  Patient reports morning stiffness for *** {minute/hour:19697}.   Patient {ACTIONS;DENIES/REPORTS:21021675::"Denies"} nocturnal pain.  Difficulty dressing/grooming: {ACTIONS;DENIES/REPORTS:21021675::"Denies"} Difficulty climbing stairs: {ACTIONS;DENIES/REPORTS:21021675::"Denies"} Difficulty getting out of chair: {ACTIONS;DENIES/REPORTS:21021675::"Denies"} Difficulty using hands for taps, buttons, cutlery, and/or writing: {ACTIONS;DENIES/REPORTS:21021675::"Denies"}  No Rheumatology ROS completed.   PMFS History:  Patient Active Problem List   Diagnosis Date Noted  . Coronary artery disease involving native coronary artery of native heart without angina pectoris 09/05/2018  . Syncope and collapse 09/05/2018  . Chest pain 05/27/2018  . HLD (hyperlipidemia) 05/27/2018  . Essential hypertension 05/27/2018  . Depression 05/27/2018  . OSA (obstructive sleep apnea) 05/03/2018  . Pulmonary hypertension ([place]) 05/03/2018  . Bimalleolar fracture of left ankle 08/12/2017  . Acute renal failure (ARF) ([place]) 08/09/2017  . Chronic diastolic (congestive) heart failure ([place]) 08/09/2017  . Acute metabolic encephalopathy 51/88/4166  . Other fatigue 06/13/2017  . Primary insomnia 06/13/2017  . History of migraine 06/13/2017  . COPD (chronic obstructive pulmonary disease) ([place]) 02/15/2017  . Tobacco abuse 02/15/2017  . Respiratory failure with hypoxia ([place]) 01/28/2017  . Migraine 01/28/2017  . Daily headache   . Hypoxia   . Shortness of breath 01/26/2017  . Non-ST elevation myocardial infarction (NSTEMI), type 2 01/26/2017  . MGUS  (monoclonal gammopathy of unknown significance) 01/11/2012  . Fibromyalgia 01/11/2012  . Bruises easily 01/11/2012    Past Medical History:  Diagnosis Date  . CAD (coronary artery disease)    a. 01/2017: cath showing 20% mid RCA stenosis and otherwise normal LAD and LCx with a preserved EF of 55 to 60%; mod pulmonary HTN  . COPD (chronic obstructive pulmonary disease) ([place])    O2 dependent  . cpap   . Depression    PTSD  . Diastolic CHF ([place])   . Fibromyalgia   . Hyperlipidemia   . Hypertension   . Migraines     Family History  Problem Relation Age of Onset  . Diabetes Mother   . Hypertension Mother   . CAD Mother   . Diabetes Father   . Lung cancer Father   . Diabetes Sister    Past Surgical History:  Procedure Laterality Date  . APPENDECTOMY    . CESAREAN SECTION    . ORIF ANKLE FRACTURE Left 08/12/2017   Procedure: OPEN REDUCTION INTERNAL FIXATION (ORIF) ANKLE FRACTURE;  Surgeon: Rod Can, MD;  Location: [facility];  Service: Orthopedics;  Laterality: Left;  . RIGHT HEART CATH N/A 07/19/2018   Procedure: RIGHT HEART CATH;  Surgeon: Jolaine Artist, MD;  Location: [facility];  Service: Cardiovascular;  Laterality: N/A;  . RIGHT/LEFT HEART CATH AND CORONARY ANGIOGRAPHY N/A 01/27/2017   Procedure: Right/Left Heart Cath and Coronary Angiography;  Surgeon: Troy Sine, MD;  Location: [facility];  Service: Cardiovascular;  Laterality: N/A;   Social History   Social History Narrative  . Not on file   Immunization History  Administered Date(s) Administered  . Influenza Split 08/15/2017  . Influenza,inj,Quad PF,6+ Mos  09/04/2018  . Influenza-Unspecified 08/15/2016  . Pneumococcal-Unspecified 02/13/2013     Objective: Vital Signs: LMP 08/19/2012    Physical Exam   Musculoskeletal Exam: ***  CDAI Exam: CDAI Score: Not documented Patient Global Assessment: Not documented; Provider Global Assessment: Not documented Swollen: Not documented;  Tender: Not documented Joint Exam   Not documented   There is currently no information documented on the homunculus. Go to the Rheumatology activity and complete the homunculus joint exam.  Investigation: No additional findings.  Imaging: No results found.  Recent Labs: Lab Results  Component Value Date   WBC 8.0 09/23/2018   HGB 10.0 (L) 09/23/2018   PLT 247 09/23/2018   NA 142 09/23/2018   K 3.9 09/23/2018   CL 105 09/23/2018   CO2 30 09/23/2018   GLUCOSE 101 (H) 09/23/2018   BUN 9 09/23/2018   CREATININE 0.94 09/23/2018   BILITOT 0.5 09/23/2018   ALKPHOS 111 09/23/2018   AST 17 09/23/2018   ALT 13 09/23/2018   PROT 7.7 09/23/2018   ALBUMIN 3.7 09/23/2018   CALCIUM 10.4 (H) 09/23/2018   GFRAA >60 09/23/2018    Speciality Comments: No specialty comments available.  Procedures:  No procedures performed Allergies: Citrus and Lisinopril   Assessment / Plan:     Visit Diagnoses: Fibromyalgia - gabapentin 800 mg by mouth twice a day, Zanaflex 4 mg at bedtime PRN   Trapezius muscle spasm  Trochanteric bursitis of left hip  Primary insomnia  Other fatigue  MGUS (monoclonal gammopathy of unknown significance)  History of migraine  History of COPD  Tobacco abuse   Orders: No orders of the defined types were placed in this encounter.  No orders of the defined types were placed in this encounter.   Face-to-face time spent with patient was *** minutes. Greater than 50% of time was spent in counseling and coordination of care.  Follow-Up Instructions: No follow-ups on file.   Ofilia Neas, PA-C  Note - This record has been created using Dragon software.  Chart creation errors have been sought, but may not always  have been located. Such creation errors do not reflect on  the standard of medical care.

## 2018-11-25 ENCOUNTER — Other Ambulatory Visit: Payer: Self-pay | Admitting: Rheumatology

## 2018-11-27 NOTE — Telephone Encounter (Signed)
Last visit:06/05/18 Next visit:12/06/18  Okay to refill per Dr. Estanislado Pandy

## 2018-11-30 DIAGNOSIS — J309 Allergic rhinitis, unspecified: Secondary | ICD-10-CM | POA: Diagnosis not present

## 2018-11-30 DIAGNOSIS — Z6834 Body mass index (BMI) 34.0-34.9, adult: Secondary | ICD-10-CM | POA: Diagnosis not present

## 2018-11-30 DIAGNOSIS — E538 Deficiency of other specified B group vitamins: Secondary | ICD-10-CM | POA: Diagnosis not present

## 2018-12-05 ENCOUNTER — Other Ambulatory Visit: Payer: Self-pay | Admitting: Rheumatology

## 2018-12-05 ENCOUNTER — Telehealth: Payer: Self-pay | Admitting: Pulmonary Disease

## 2018-12-05 DIAGNOSIS — J9611 Chronic respiratory failure with hypoxia: Secondary | ICD-10-CM

## 2018-12-05 NOTE — Telephone Encounter (Signed)
Spoke with the pt  I advised that [facility] does not have any POC's right now  She states that she is aware of this, but she was advised by [facility] that they still need an order for this to put her on a list  I advised that with the high liter flow that she is on, I am unsure if they can accommodate this  She verbalized understanding and still wants an order sent   Order sent to [facility]

## 2018-12-05 NOTE — Telephone Encounter (Signed)
Last visit:06/05/18 Next visit:12/06/18  Okay to refill per Dr. Estanislado Pandy

## 2018-12-06 ENCOUNTER — Ambulatory Visit: Payer: Self-pay | Admitting: Physician Assistant

## 2018-12-06 DIAGNOSIS — J441 Chronic obstructive pulmonary disease with (acute) exacerbation: Secondary | ICD-10-CM | POA: Diagnosis not present

## 2018-12-06 DIAGNOSIS — J449 Chronic obstructive pulmonary disease, unspecified: Secondary | ICD-10-CM | POA: Diagnosis not present

## 2018-12-06 DIAGNOSIS — J9611 Chronic respiratory failure with hypoxia: Secondary | ICD-10-CM | POA: Diagnosis not present

## 2018-12-07 ENCOUNTER — Telehealth: Payer: Self-pay | Admitting: Pulmonary Disease

## 2018-12-07 NOTE — Telephone Encounter (Signed)
Spoke with the pt and notified of recs per [facility]  She understands  Nothing further needed

## 2018-12-12 ENCOUNTER — Ambulatory Visit: Payer: 59 | Admitting: Pulmonary Disease

## 2018-12-22 ENCOUNTER — Telehealth: Payer: Self-pay | Admitting: Pulmonary Disease

## 2018-12-22 DIAGNOSIS — J9611 Chronic respiratory failure with hypoxia: Secondary | ICD-10-CM

## 2018-12-22 NOTE — Telephone Encounter (Signed)
Called patient, she stated that [facility] told her they were unable to provide her with accommodations to what she is needing tank wise. Patient asked if we could call [facility] and have her switched. Called [facility] was placed on hold for over 8 minutes. LMTCB

## 2018-12-25 ENCOUNTER — Other Ambulatory Visit: Payer: Self-pay | Admitting: Rheumatology

## 2018-12-25 NOTE — Telephone Encounter (Signed)
Spoke with pt. Advised her that we would send her order to another DME but she might run into the same issue with another DME. There aren't any POCs that are going to light due to the amount of oxygen that she uses. Pt would like to proceed with having her POC ordered through another DME. Order has been placed. Nothing further was needed.

## 2018-12-26 ENCOUNTER — Other Ambulatory Visit: Payer: Self-pay | Admitting: Family Medicine

## 2018-12-26 DIAGNOSIS — Z1231 Encounter for screening mammogram for malignant neoplasm of breast: Secondary | ICD-10-CM

## 2018-12-29 ENCOUNTER — Ambulatory Visit
Admission: RE | Admit: 2018-12-29 | Discharge: 2018-12-29 | Disposition: A | Discharge status: Home / Self Care | Payer: 59 | Source: Ambulatory Visit | Attending: Family Medicine | Admitting: Family Medicine

## 2018-12-29 DIAGNOSIS — Z1231 Encounter for screening mammogram for malignant neoplasm of breast: Secondary | ICD-10-CM

## 2019-01-06 ENCOUNTER — Other Ambulatory Visit: Payer: Self-pay | Admitting: Rheumatology

## 2019-01-06 DIAGNOSIS — J441 Chronic obstructive pulmonary disease with (acute) exacerbation: Secondary | ICD-10-CM | POA: Diagnosis not present

## 2019-01-06 DIAGNOSIS — J9611 Chronic respiratory failure with hypoxia: Secondary | ICD-10-CM | POA: Diagnosis not present

## 2019-01-06 DIAGNOSIS — J449 Chronic obstructive pulmonary disease, unspecified: Secondary | ICD-10-CM | POA: Diagnosis not present

## 2019-01-08 NOTE — Telephone Encounter (Signed)
ok 

## 2019-01-08 NOTE — Progress Notes (Deleted)
Office Visit Note  Patient: Teresa Roberts             Date of Birth: 1961-06-05           MRN: [id number]             PCP: Jonathon Jordan, MD Referring: Jonathon Jordan, MD Visit Date: 01/11/2019 Occupation: @GUAROCC @  Subjective:  No chief complaint on file.   History of Present Illness: Teresa Roberts is a 58 y.o. female ***   Activities of Daily Living:  Patient reports morning stiffness for *** {minute/hour:19697}.   Patient {ACTIONS;DENIES/REPORTS:21021675::"Denies"} nocturnal pain.  Difficulty dressing/grooming: {ACTIONS;DENIES/REPORTS:21021675::"Denies"} Difficulty climbing stairs: {ACTIONS;DENIES/REPORTS:21021675::"Denies"} Difficulty getting out of chair: {ACTIONS;DENIES/REPORTS:21021675::"Denies"} Difficulty using hands for taps, buttons, cutlery, and/or writing: {ACTIONS;DENIES/REPORTS:21021675::"Denies"}  No Rheumatology ROS completed.   PMFS History:  Patient Active Problem List   Diagnosis Date Noted  . Coronary artery disease involving native coronary artery of native heart without angina pectoris 09/05/2018  . Syncope and collapse 09/05/2018  . Chest pain 05/27/2018  . HLD (hyperlipidemia) 05/27/2018  . Essential hypertension 05/27/2018  . Depression 05/27/2018  . OSA (obstructive sleep apnea) 05/03/2018  . Pulmonary hypertension ([place]) 05/03/2018  . Bimalleolar fracture of left ankle 08/12/2017  . Acute renal failure (ARF) ([place]) 08/09/2017  . Chronic diastolic (congestive) heart failure ([place]) 08/09/2017  . Acute metabolic encephalopathy 85/88/5027  . Other fatigue 06/13/2017  . Primary insomnia 06/13/2017  . History of migraine 06/13/2017  . COPD (chronic obstructive pulmonary disease) ([place]) 02/15/2017  . Tobacco abuse 02/15/2017  . Respiratory failure with hypoxia ([place]) 01/28/2017  . Migraine 01/28/2017  . Daily headache   . Hypoxia   . Shortness of breath 01/26/2017  . Non-ST elevation myocardial infarction (NSTEMI), type 2 01/26/2017  . MGUS  (monoclonal gammopathy of unknown significance) 01/11/2012  . Fibromyalgia 01/11/2012  . Bruises easily 01/11/2012    Past Medical History:  Diagnosis Date  . CAD (coronary artery disease)    a. 01/2017: cath showing 20% mid RCA stenosis and otherwise normal LAD and LCx with a preserved EF of 55 to 60%; mod pulmonary HTN  . COPD (chronic obstructive pulmonary disease) ([place])    O2 dependent  . cpap   . Depression    PTSD  . Diastolic CHF ([place])   . Fibromyalgia   . Hyperlipidemia   . Hypertension   . Migraines     Family History  Problem Relation Age of Onset  . Diabetes Mother   . Hypertension Mother   . CAD Mother   . Diabetes Father   . Lung cancer Father   . Diabetes Sister    Past Surgical History:  Procedure Laterality Date  . APPENDECTOMY    . CESAREAN SECTION    . ORIF ANKLE FRACTURE Left 08/12/2017   Procedure: OPEN REDUCTION INTERNAL FIXATION (ORIF) ANKLE FRACTURE;  Surgeon: Rod Can, MD;  Location: [facility];  Service: Orthopedics;  Laterality: Left;  . RIGHT HEART CATH N/A 07/19/2018   Procedure: RIGHT HEART CATH;  Surgeon: Jolaine Artist, MD;  Location: [facility];  Service: Cardiovascular;  Laterality: N/A;  . RIGHT/LEFT HEART CATH AND CORONARY ANGIOGRAPHY N/A 01/27/2017   Procedure: Right/Left Heart Cath and Coronary Angiography;  Surgeon: Troy Sine, MD;  Location: [facility];  Service: Cardiovascular;  Laterality: N/A;   Social History   Social History Narrative  . Not on file   Immunization History  Administered Date(s) Administered  . Influenza Split 08/15/2017  . Influenza,inj,Quad PF,6+ Mos  09/04/2018  . Influenza-Unspecified 08/15/2016  . Pneumococcal-Unspecified 02/13/2013     Objective: Vital Signs: LMP 08/19/2012    Physical Exam   Musculoskeletal Exam: ***  CDAI Exam: CDAI Score: Not documented Patient Global Assessment: Not documented; Provider Global Assessment: Not documented Swollen: Not documented;  Tender: Not documented Joint Exam   Not documented   There is currently no information documented on the homunculus. Go to the Rheumatology activity and complete the homunculus joint exam.  Investigation: No additional findings.  Imaging: Mm 3d Screen Breast Bilateral  Result Date: 01/01/2019 CLINICAL DATA:  Screening. EXAM: DIGITAL SCREENING BILATERAL MAMMOGRAM WITH TOMO AND CAD COMPARISON:  Previous exam(s). ACR Breast Density Category b: There are scattered areas of fibroglandular density. FINDINGS: There are no findings suspicious for malignancy. Images were processed with CAD. IMPRESSION: No mammographic evidence of malignancy. A result letter of this screening mammogram will be mailed directly to the patient. RECOMMENDATION: Screening mammogram in one year. (Code:SM-B-01Y) BI-RADS CATEGORY  1: Negative. Electronically Signed   By: Curlene Dolphin M.D.   On: 01/01/2019 09:45    Recent Labs: Lab Results  Component Value Date   WBC 8.0 09/23/2018   HGB 10.0 (L) 09/23/2018   PLT 247 09/23/2018   NA 142 09/23/2018   K 3.9 09/23/2018   CL 105 09/23/2018   CO2 30 09/23/2018   GLUCOSE 101 (H) 09/23/2018   BUN 9 09/23/2018   CREATININE 0.94 09/23/2018   BILITOT 0.5 09/23/2018   ALKPHOS 111 09/23/2018   AST 17 09/23/2018   ALT 13 09/23/2018   PROT 7.7 09/23/2018   ALBUMIN 3.7 09/23/2018   CALCIUM 10.4 (H) 09/23/2018   GFRAA >60 09/23/2018    Speciality Comments: No specialty comments available.  Procedures:  No procedures performed Allergies: Citrus and Lisinopril   Assessment / Plan:     Visit Diagnoses: Fibromyalgia  Trapezius muscle spasm  Other fatigue  Primary insomnia  Trochanteric bursitis of left hip  MGUS (monoclonal gammopathy of unknown significance)  History of migraine  History of COPD  Tobacco abuse   Orders: No orders of the defined types were placed in this encounter.  No orders of the defined types were placed in this  encounter.   Face-to-face time spent with patient was *** minutes. Greater than 50% of time was spent in counseling and coordination of care.  Follow-Up Instructions: No follow-ups on file.   Ofilia Neas, PA-C  Note - This record has been created using Dragon software.  Chart creation errors have been sought, but may not always  have been located. Such creation errors do not reflect on  the standard of medical care.

## 2019-01-08 NOTE — Telephone Encounter (Signed)
Please schedule follow up. Patient was due January 2020. [facility]

## 2019-01-08 NOTE — Telephone Encounter (Signed)
Last visit:06/05/18 Next visit: was due January 2020. Message sent to the front to schedule patient.   Okay to refill Skelexin?

## 2019-01-11 ENCOUNTER — Ambulatory Visit: Payer: Self-pay | Admitting: Physician Assistant

## 2019-01-18 ENCOUNTER — Telehealth: Payer: Self-pay | Admitting: *Deleted

## 2019-01-18 NOTE — Telephone Encounter (Signed)
Patient left message requesting a return call.   Attempted to contact the patient and left message for patient to call the office.

## 2019-01-19 NOTE — Progress Notes (Signed)
Office Visit Note  Patient: Teresa Roberts             Date of Birth: 02-Mar-1961           MRN: [id number]             PCP: Jonathon Jordan, MD Referring: Jonathon Jordan, MD Visit Date: 01/24/2019 Occupation: @GUAROCC @  Subjective:  Trapezius muscle spasms   History of Present Illness: Teresa Roberts is a 58 y.o. female with history of fibromyalgia.  She is taking Skelaxin 800 mg TID PRN for muscle spasms and Zanaflex 4 mg po at bedtime for muscle spasms.  She has not noticed any benefit taking Skelaxin and Zanaflex.  She states on days that she does not take these medications she has not noticed any difference.  She reports she has been having more frequent and severe fibromyalgia flares.  She states she is also been having involuntary muscle twitching on a daily basis for the past couple months in her hands and feet.  She is been having more frequent trapezius muscle spasms bilaterally.  She continues to have trochanter bursitis bilaterally.  She has generalized muscle aches and muscle tenderness due to fibromyalgia.  She states her fatigue has been worsening recently.  She states that she has been sleeping good and sleeping up to 8 to 10 hours per night.  She takes trazodone 100 mg by mouth at bedtime to help her sleep at night.  She continues to take gabapentin as prescribed but feels as though is as effective as it used to be.  She denies any joint pain or joint swelling at this time.    Activities of Daily Living:  Patient reports morning stiffness for 2 hour.   Patient Reports nocturnal pain.  Difficulty dressing/grooming: Denies Difficulty climbing stairs: Reports Difficulty getting out of chair: Denies Difficulty using hands for taps, buttons, cutlery, and/or writing: Reports  Review of Systems  Constitutional: Positive for fatigue.  HENT: Positive for mouth dryness. Negative for mouth sores and nose dryness.   Eyes: Positive for dryness. Negative for pain and visual disturbance.    Respiratory: Negative for cough, hemoptysis and difficulty breathing.   Cardiovascular: Positive for swelling in legs/feet. Negative for chest pain, palpitations and hypertension.  Gastrointestinal: Negative for blood in stool, constipation and diarrhea.  Endocrine: Negative for increased urination.  Genitourinary: Negative for difficulty urinating and painful urination.  Musculoskeletal: Positive for arthralgias, joint pain, muscle weakness, morning stiffness and muscle tenderness. Negative for joint swelling, myalgias and myalgias.  Skin: Negative for color change, pallor, rash, hair loss, nodules/bumps, skin tightness, ulcers and sensitivity to sunlight.  Allergic/Immunologic: Negative for susceptible to infections.  Neurological: Negative for dizziness, numbness and headaches.  Hematological: Negative for bruising/bleeding tendency and swollen glands.  Psychiatric/Behavioral: Negative for depressed mood and sleep disturbance. The patient is not nervous/anxious.     PMFS History:  Patient Active Problem List   Diagnosis Date Noted  . Coronary artery disease involving native coronary artery of native heart without angina pectoris 09/05/2018  . Syncope and collapse 09/05/2018  . Chest pain 05/27/2018  . HLD (hyperlipidemia) 05/27/2018  . Essential hypertension 05/27/2018  . Depression 05/27/2018  . OSA (obstructive sleep apnea) 05/03/2018  . Pulmonary hypertension ([place]) 05/03/2018  . Bimalleolar fracture of left ankle 08/12/2017  . Acute renal failure (ARF) ([place]) 08/09/2017  . Chronic diastolic (congestive) heart failure ([place]) 08/09/2017  . Acute metabolic encephalopathy 40/98/1191  . Other fatigue 06/13/2017  . Primary insomnia 06/13/2017  .  History of migraine 06/13/2017  . COPD (chronic obstructive pulmonary disease) ([place]) 02/15/2017  . Tobacco abuse 02/15/2017  . Respiratory failure with hypoxia ([place]) 01/28/2017  . Migraine 01/28/2017  . Daily headache   . Hypoxia   .  Shortness of breath 01/26/2017  . Non-ST elevation myocardial infarction (NSTEMI), type 2 01/26/2017  . MGUS (monoclonal gammopathy of unknown significance) 01/11/2012  . Fibromyalgia 01/11/2012  . Bruises easily 01/11/2012    Past Medical History:  Diagnosis Date  . CAD (coronary artery disease)    a. 01/2017: cath showing 20% mid RCA stenosis and otherwise normal LAD and LCx with a preserved EF of 55 to 60%; mod pulmonary HTN  . COPD (chronic obstructive pulmonary disease) ([place])    O2 dependent  . cpap   . Depression    PTSD  . Diastolic CHF ([place])   . Fibromyalgia   . Hyperlipidemia   . Hypertension   . Migraines     Family History  Problem Relation Age of Onset  . Diabetes Mother   . Hypertension Mother   . CAD Mother   . Diabetes Father   . Lung cancer Father   . Diabetes Sister    Past Surgical History:  Procedure Laterality Date  . APPENDECTOMY    . CESAREAN SECTION    . ORIF ANKLE FRACTURE Left 08/12/2017   Procedure: OPEN REDUCTION INTERNAL FIXATION (ORIF) ANKLE FRACTURE;  Surgeon: Rod Can, MD;  Location: [facility];  Service: Orthopedics;  Laterality: Left;  . RIGHT HEART CATH N/A 07/19/2018   Procedure: RIGHT HEART CATH;  Surgeon: Jolaine Artist, MD;  Location: [facility];  Service: Cardiovascular;  Laterality: N/A;  . RIGHT/LEFT HEART CATH AND CORONARY ANGIOGRAPHY N/A 01/27/2017   Procedure: Right/Left Heart Cath and Coronary Angiography;  Surgeon: Troy Sine, MD;  Location: [facility];  Service: Cardiovascular;  Laterality: N/A;   Social History   Social History Narrative  . Not on file   Immunization History  Administered Date(s) Administered  . Influenza Split 08/15/2017  . Influenza,inj,Quad PF,6+ Mos 09/04/2018  . Influenza-Unspecified 08/15/2016  . Pneumococcal-Unspecified 02/13/2013     Objective: Vital Signs: BP 108/65 (BP Location: Left Arm, Patient Position: Sitting, Cuff Size: Normal)   Pulse 94   Ht 5\' 6"  (1.676  m)   Wt 214 lb (97.1 kg)   LMP 08/19/2012   BMI 34.54 kg/m    Physical Exam Vitals signs and nursing note reviewed.  Constitutional:      Appearance: She is well-developed.  HENT:     Head: Normocephalic and atraumatic.  Eyes:     Conjunctiva/sclera: Conjunctivae normal.  Neck:     Musculoskeletal: Normal range of motion.  Cardiovascular:     Rate and Rhythm: Normal rate and regular rhythm.     Heart sounds: Normal heart sounds.  Pulmonary:     Effort: Pulmonary effort is normal.     Breath sounds: Normal breath sounds.  Abdominal:     General: Bowel sounds are normal.     Palpations: Abdomen is soft.  Lymphadenopathy:     Cervical: No cervical adenopathy.  Skin:    General: Skin is warm and dry.     Capillary Refill: Capillary refill takes less than 2 seconds.  Neurological:     Mental Status: She is alert and oriented to person, place, and time.  Psychiatric:        Behavior: Behavior normal.      Musculoskeletal Exam: Generalized hyperalgesia  and positive tender points.  C-spine good range of motion with discomfort.  She is trapezius muscle spasms and muscle tenderness bilaterally.  Thoracic and lumbar spine good range of motion.  Shoulder joints limited abduction due to discomfort in her muscles.  Elbow joints, wrist joints MCPs and PIPs, DIPs good range of motion no synovitis.  She is complete fist ratio bilaterally.  Hip joints, knee joints, ankle joints, MCPs, PIPs, DIPs good range of motion no synovitis.  No warmth or effusion of knee joints.  No tenderness or swelling of ankle joints.  Tenderness over bilateral trochanteric bursa.  CDAI Exam: CDAI Score: Not documented Patient Global Assessment: Not documented; Provider Global Assessment: Not documented Swollen: Not documented; Tender: Not documented Joint Exam   Not documented   There is currently no information documented on the homunculus. Go to the Rheumatology activity and complete the homunculus joint  exam.  Investigation: No additional findings.  Imaging: Mm 3d Screen Breast Bilateral  Result Date: 01/01/2019 CLINICAL DATA:  Screening. EXAM: DIGITAL SCREENING BILATERAL MAMMOGRAM WITH TOMO AND CAD COMPARISON:  Previous exam(s). ACR Breast Density Category b: There are scattered areas of fibroglandular density. FINDINGS: There are no findings suspicious for malignancy. Images were processed with CAD. IMPRESSION: No mammographic evidence of malignancy. A result letter of this screening mammogram will be mailed directly to the patient. RECOMMENDATION: Screening mammogram in one year. (Code:SM-B-01Y) BI-RADS CATEGORY  1: Negative. Electronically Signed   By: Curlene Dolphin M.D.   On: 01/01/2019 09:45    Recent Labs: Lab Results  Component Value Date   WBC 8.0 09/23/2018   HGB 10.0 (L) 09/23/2018   PLT 247 09/23/2018   NA 142 09/23/2018   K 3.9 09/23/2018   CL 105 09/23/2018   CO2 30 09/23/2018   GLUCOSE 101 (H) 09/23/2018   BUN 9 09/23/2018   CREATININE 0.94 09/23/2018   BILITOT 0.5 09/23/2018   ALKPHOS 111 09/23/2018   AST 17 09/23/2018   ALT 13 09/23/2018   PROT 7.7 09/23/2018   ALBUMIN 3.7 09/23/2018   CALCIUM 10.4 (H) 09/23/2018   GFRAA >60 09/23/2018    Speciality Comments: No specialty comments available.  Procedures:  Trigger Point Inj Date/Time: 01/24/2019 10:47 AM Performed by: Bo Merino, MD Authorized by: Bo Merino, MD   Consent Given by:  Patient Site marked: the procedure site was marked   Timeout: prior to procedure the correct patient, procedure, and site was verified   Indications:  Pain Total # of Trigger Points:  2 Location: neck   Needle Size:  27 G Approach:  Dorsal Medications #1:  0.5 mL lidocaine 1 %; 10 mg triamcinolone acetonide 40 MG/ML Medications #2:  0.5 mL lidocaine 1 %; 10 mg triamcinolone acetonide 40 MG/ML Patient tolerance:  Patient tolerated the procedure well with no immediate complications   Allergies: Citrus and  Lisinopril   Assessment / Plan:     Visit Diagnoses: Fibromyalgia -She has generalized hyperalgesia and positive tender points on exam.  She has been having more frequent and severe fibromyalgia flares.  She has generalized muscle aches and muscle tenderness due to fibromyalgia.  She has trapezius muscle spasms bilaterally.  She requested bilateral trigger point injections which she tolerated well.  She continues to have bilateral trochanter bursitis and was given a handout of exercises to perform.  She has been taking Skelaxin and Zanaflex as prescribed and feels as though they are ineffective.  On days that she does not take these medications she does not  notice any difference.  We discussed coming off of these medications if they are ineffective.  She has retried Robaxin in the past which was ineffective as well.  We discussed a referral to pain management but she declined.  She continues to have chronic fatigue but has been sleeping 8 to 10 hours/day.  She takes trazodone 100 mg by mouth at bedtime to have her sleep.  She will follow-up in 6 months.    Trapezius muscle spasm: She is been having increased trapezius muscle spasms and muscle tenderness.  She requested bilateral trigger point injections.  She tolerated the procedure well.  Procedure note was completed above.  Trochanteric bursitis of both hips: She has tenderness over bilateral trochanter bursa.  She was given a handout of exercises to perform.  Primary insomnia: She takes trazodone 100 mg  by mouth at bedtime to help her sleep at night.  Good sleep hygiene was discussed.  She typically sleeps 8 to 10 hours per night.  Other fatigue: She has noticed worsening fatigue over the past several months.  Other medical conditions are listed as follows:  MGUS (monoclonal gammopathy of unknown significance)  History of COPD: She is using a portable oxygen tank with a nasal cannula.  History of migraine  Former smoker   Orders: Orders  Placed This Encounter  Procedures  . Trigger Point Inj   No orders of the defined types were placed in this encounter.   Face-to-face time spent with patient was 30 minutes. Greater than 50% of time was spent in counseling and coordination of care.  Follow-Up Instructions: Return in about 6 months (around 07/27/2019) for Fibromyalgia.   Ofilia Neas, PA-C  Note - This record has been created using Dragon software.  Chart creation errors have been sought, but may not always  have been located. Such creation errors do not reflect on  the standard of medical care.

## 2019-01-24 ENCOUNTER — Ambulatory Visit (INDEPENDENT_AMBULATORY_CARE_PROVIDER_SITE_OTHER): Payer: 59 | Admitting: Physician Assistant

## 2019-01-24 ENCOUNTER — Encounter (INDEPENDENT_AMBULATORY_CARE_PROVIDER_SITE_OTHER): Payer: Self-pay

## 2019-01-24 ENCOUNTER — Other Ambulatory Visit: Payer: Self-pay

## 2019-01-24 ENCOUNTER — Encounter: Payer: Self-pay | Admitting: Physician Assistant

## 2019-01-24 VITALS — BP 108/65 | HR 94 | Ht 66.0 in | Wt 214.0 lb

## 2019-01-24 DIAGNOSIS — F5101 Primary insomnia: Secondary | ICD-10-CM

## 2019-01-24 DIAGNOSIS — Z8669 Personal history of other diseases of the nervous system and sense organs: Secondary | ICD-10-CM

## 2019-01-24 DIAGNOSIS — Z8709 Personal history of other diseases of the respiratory system: Secondary | ICD-10-CM

## 2019-01-24 DIAGNOSIS — Z87891 Personal history of nicotine dependence: Secondary | ICD-10-CM | POA: Diagnosis not present

## 2019-01-24 DIAGNOSIS — M797 Fibromyalgia: Secondary | ICD-10-CM

## 2019-01-24 DIAGNOSIS — D472 Monoclonal gammopathy: Secondary | ICD-10-CM

## 2019-01-24 DIAGNOSIS — M62838 Other muscle spasm: Secondary | ICD-10-CM | POA: Diagnosis not present

## 2019-01-24 DIAGNOSIS — R5383 Other fatigue: Secondary | ICD-10-CM | POA: Diagnosis not present

## 2019-01-24 DIAGNOSIS — M7062 Trochanteric bursitis, left hip: Secondary | ICD-10-CM

## 2019-01-24 DIAGNOSIS — M7061 Trochanteric bursitis, right hip: Secondary | ICD-10-CM | POA: Diagnosis not present

## 2019-01-24 MED ORDER — TRIAMCINOLONE ACETONIDE 40 MG/ML IJ SUSP
10.0000 mg | INTRAMUSCULAR | Status: AC | PRN
  Administered 2019-01-24: 10 mg via INTRAMUSCULAR

## 2019-01-24 MED ORDER — LIDOCAINE HCL 1 % IJ SOLN
0.5000 mL | INTRAMUSCULAR | Status: AC | PRN
  Administered 2019-01-24: .5 mL

## 2019-01-24 NOTE — Patient Instructions (Signed)

## 2019-01-30 ENCOUNTER — Telehealth: Payer: Self-pay | Admitting: Pulmonary Disease

## 2019-01-30 NOTE — Telephone Encounter (Signed)
There is a 5 year rule for pt's who have O2.  Medicare will pay everything they are going to pay for 5 years within the first 2 years of pt having oxygen.  So if a patient has been on O2 for a year, another dme does not want to take them because they are only going to get paid for another year and then nothing until the 5 years is up.  So, once a pt has started getting O2 they can usually only switch companies if they have been with a company for just a few months or if they have been with them for over 5 years - then the new dme knows they will get paid.

## 2019-01-30 NOTE — Telephone Encounter (Signed)
Called and spoke with patient she is aware and verbalized understanding. Nothing further needed.  

## 2019-01-30 NOTE — Telephone Encounter (Signed)
Called and spoke with patient she stated that she tried to switch from [employer] over to [employer] but due to being on oxygen for a year they would not let her. Patient stated that it had nothing to do with her insurance but she is not sure as to why they will not let her switch over. I tried calling [employer] and was placed on hold for over 10 minutes.   RA please advise if you have any information to give the patient on what she can do. Thank you.   Or [facility] can you help with this?

## 2019-02-02 ENCOUNTER — Other Ambulatory Visit: Payer: Self-pay | Admitting: Rheumatology

## 2019-02-02 NOTE — Telephone Encounter (Signed)
Last Visit: 01/24/19 Next visit: 07/25/19  Okay to refill per Dr. Deveshwar. 

## 2019-02-04 DIAGNOSIS — J9611 Chronic respiratory failure with hypoxia: Secondary | ICD-10-CM | POA: Diagnosis not present

## 2019-02-04 DIAGNOSIS — J449 Chronic obstructive pulmonary disease, unspecified: Secondary | ICD-10-CM | POA: Diagnosis not present

## 2019-02-04 DIAGNOSIS — J441 Chronic obstructive pulmonary disease with (acute) exacerbation: Secondary | ICD-10-CM | POA: Diagnosis not present

## 2019-02-07 ENCOUNTER — Other Ambulatory Visit: Payer: Self-pay | Admitting: Rheumatology

## 2019-02-07 DIAGNOSIS — G4733 Obstructive sleep apnea (adult) (pediatric): Secondary | ICD-10-CM | POA: Diagnosis not present

## 2019-02-08 NOTE — Telephone Encounter (Signed)
Last Visit:01/24/2019 Next Visit:07/25/2019  Okay to refill per Dr. Deveshwar 

## 2019-02-19 ENCOUNTER — Other Ambulatory Visit: Payer: Self-pay | Admitting: Rheumatology

## 2019-02-19 NOTE — Telephone Encounter (Signed)
Last Visit: 01/24/19 Next visit: 07/25/19  Okay to refill per Dr. Estanislado Pandy

## 2019-02-21 DIAGNOSIS — G4733 Obstructive sleep apnea (adult) (pediatric): Secondary | ICD-10-CM | POA: Diagnosis not present

## 2019-03-01 ENCOUNTER — Other Ambulatory Visit: Payer: Self-pay | Admitting: Rheumatology

## 2019-03-01 NOTE — Telephone Encounter (Signed)
Last Visit: 01/24/2019 Next Visit: 07/25/2019  Okay to refill per Dr. Estanislado Pandy

## 2019-03-07 DIAGNOSIS — J441 Chronic obstructive pulmonary disease with (acute) exacerbation: Secondary | ICD-10-CM | POA: Diagnosis not present

## 2019-03-07 DIAGNOSIS — J449 Chronic obstructive pulmonary disease, unspecified: Secondary | ICD-10-CM | POA: Diagnosis not present

## 2019-03-07 DIAGNOSIS — J9611 Chronic respiratory failure with hypoxia: Secondary | ICD-10-CM | POA: Diagnosis not present

## 2019-03-14 ENCOUNTER — Other Ambulatory Visit: Payer: Self-pay | Admitting: Rheumatology

## 2019-03-14 NOTE — Telephone Encounter (Signed)
Last Visit: 01/24/19 Next visit: 07/25/19  Okay to refill per Dr. Estanislado Pandy.

## 2019-03-25 ENCOUNTER — Other Ambulatory Visit: Payer: Self-pay | Admitting: Rheumatology

## 2019-04-09 ENCOUNTER — Other Ambulatory Visit: Payer: Self-pay | Admitting: Rheumatology

## 2019-04-10 NOTE — Telephone Encounter (Signed)
Last Visit: 01/24/19 Next visit: 07/25/19  Okay to refill per Dr. Estanislado Pandy

## 2019-04-26 ENCOUNTER — Other Ambulatory Visit: Payer: Self-pay | Admitting: Pulmonary Disease

## 2019-05-05 ENCOUNTER — Other Ambulatory Visit: Payer: Self-pay | Admitting: Rheumatology

## 2019-05-07 NOTE — Telephone Encounter (Signed)
Last Visit:01/24/2019 Next Visit:07/25/2019  Okay to refill per Dr. Estanislado Pandy

## 2019-05-09 ENCOUNTER — Other Ambulatory Visit: Payer: Self-pay | Admitting: Rheumatology

## 2019-05-09 NOTE — Telephone Encounter (Signed)
Last Visit: 01/24/19 Next visit: 07/25/19  Okay to refillper Dr. Estanislado Pandy

## 2019-05-26 ENCOUNTER — Other Ambulatory Visit: Payer: Self-pay | Admitting: Rheumatology

## 2019-05-28 NOTE — Telephone Encounter (Signed)
Last Visit:01/24/2019 Next Visit:07/25/2019  Okay to refill per Dr. Estanislado Pandy

## 2019-05-31 ENCOUNTER — Other Ambulatory Visit: Payer: Self-pay | Admitting: Family Medicine

## 2019-05-31 ENCOUNTER — Other Ambulatory Visit (HOSPITAL_COMMUNITY)
Admission: RE | Admit: 2019-05-31 | Discharge: 2019-05-31 | Disposition: A | Discharge status: Home / Self Care | Payer: 59 | Source: Ambulatory Visit | Attending: Family Medicine | Admitting: Family Medicine

## 2019-05-31 DIAGNOSIS — Z01411 Encounter for gynecological examination (general) (routine) with abnormal findings: Secondary | ICD-10-CM | POA: Insufficient documentation

## 2019-06-03 ENCOUNTER — Other Ambulatory Visit: Payer: Self-pay | Admitting: Rheumatology

## 2019-06-04 NOTE — Telephone Encounter (Signed)
Last Visit: 01/24/2019 Next Visit: 07/25/2019  Okay to refill per Dr. Estanislado Pandy.

## 2019-06-05 LAB — CYTOLOGY - PAP
Adequacy: ABSENT
Diagnosis: NEGATIVE
HPV: NOT DETECTED

## 2019-06-14 ENCOUNTER — Other Ambulatory Visit: Payer: Self-pay | Admitting: Rheumatology

## 2019-06-14 NOTE — Telephone Encounter (Signed)
Last Visit: 01/24/19 Next Visit: 07/25/19  Okay to refill per Dr. Estanislado Pandy

## 2019-06-18 ENCOUNTER — Telehealth: Payer: Self-pay | Admitting: Hematology

## 2019-06-18 NOTE — Telephone Encounter (Signed)
Received a new hem referral from Dr. Stephanie Acre at [facility] for [place]. Pt has been cld and scheduled to see Dr. Irene Limbo on 8/12 at 11am. Pt is aware to arrive 20 minutes early.

## 2019-06-21 ENCOUNTER — Other Ambulatory Visit: Payer: Self-pay

## 2019-06-21 ENCOUNTER — Encounter: Payer: Self-pay | Admitting: Pulmonary Disease

## 2019-06-21 ENCOUNTER — Ambulatory Visit (INDEPENDENT_AMBULATORY_CARE_PROVIDER_SITE_OTHER): Payer: 59 | Admitting: Pulmonary Disease

## 2019-06-21 DIAGNOSIS — G4733 Obstructive sleep apnea (adult) (pediatric): Secondary | ICD-10-CM | POA: Diagnosis not present

## 2019-06-21 DIAGNOSIS — J432 Centrilobular emphysema: Secondary | ICD-10-CM | POA: Diagnosis not present

## 2019-06-21 DIAGNOSIS — J9611 Chronic respiratory failure with hypoxia: Secondary | ICD-10-CM

## 2019-06-21 MED ORDER — TRELEGY ELLIPTA 100-62.5-25 MCG/INH IN AEPB: 1.0000 | INHALATION_SPRAY | Freq: Every day | RESPIRATORY_TRACT | 0 refills | Status: AC

## 2019-06-21 NOTE — Assessment & Plan Note (Signed)
She has good compliance and CPAP is certainly helped improve her daytime somnolence and fatigue Weight loss encouraged, compliance with goal of at least 4-6 hrs every night is the expectation. Advised against medications with sedative side effects Cautioned against driving when sleepy - understanding that sleepiness will vary on a day to day basis

## 2019-06-21 NOTE — Addendum Note (Signed)
Addended by: Joella Prince on: 06/21/2019 04:10 PM   Modules accepted: Orders

## 2019-06-21 NOTE — Patient Instructions (Addendum)
Continue Trelegy You have to quit smoking altogether  Use oxygen 6 L at rest & on walking

## 2019-06-21 NOTE — Progress Notes (Signed)
   Subjective:    Patient ID: Teresa Roberts, female    DOB: Mar 30, 1961, 58 y.o.   MRN: [id number]  HPI  58 yo heavy smokerfor FUof COPD and pulmonary hypertension. She smoked > 30-pack-years Hypoxia quite out of proportion to degree of airway obstruction or pulmonary hypertension  Chief Complaint  Patient presents with  . Follow-up    no complaints today for breathing or cpap   She has remained stable for the past few months, last office visit was in 2019. For some reason she has increased her oxygen to 8 L. She continues to smoke 1 to 2 cigarettes/day. Weight is stable, she denies pedal edema, orthopnea or paroxysmal nocturnal dyspnea. She is compliant with Trelegy-she uses portable oxygen 5 L pulse when she is outside but mostly stays at home and uses 8 L continuous  She is compliant with her CPAP machine and denies any problems with mask or pressure Download was reviewed which shows average pressure of 14 cm on auto settings 12 to 20 cm with good compliance of, few missed nights  After her last cardiac cath in 07/2018, pulmonary artery vasodilators were not considered necessary unless she worsens   Significant tests/ events reviewed  CT angiogram 01/2017 neg forpulmonary embolism .  ABG showed mild hypercarbia with 7.36/45/62 and 7.33/51/70 . LHCshowed no evidence of coronary artery disease and normal LV function.  RHCshowed RVpressures of 55/14 and LVEDP was 18. PVR not   Echo 05/2018 RVSP 25  Cath 07/2018  PA 50/19, PVR 3.6 WU, RA13  Spirometry4/2018ratio of 69, FEV1 of 1.15-52% and FVC of 60%  NPSG ( [place]) which per report showed AHI of 16/hour >> auto CPAP 12-20 cm>> avg 16 cm  Review of Systems neg for any significant sore throat, dysphagia, itching, sneezing, nasal congestion or excess/ purulent secretions, fever, chills, sweats, unintended wt loss, pleuritic or exertional cp, hempoptysis, orthopnea pnd or change in chronic leg swelling. Also denies presyncope,  palpitations, heartburn, abdominal pain, nausea, vomiting, diarrhea or change in bowel or urinary habits, dysuria,hematuria, rash, arthralgias, visual complaints, headache, numbness weakness or ataxia.     Objective:   Physical Exam  Gen. Pleasant, obese, in no distress, normal affect ENT - no pallor,icterus, no post nasal drip, class 2-3 airway Neck: No JVD, no thyromegaly, no carotid bruits Lungs: no use of accessory muscles, no dullness to percussion, decreased without rales or rhonchi  Cardiovascular: Rhythm regular, heart sounds  normal, no murmurs or gallops, no peripheral edema Abdomen: soft and non-tender, no hepatosplenomegaly, BS normal. Musculoskeletal: No deformities, no cyanosis or clubbing Neuro:  alert, non focal, no tremors       Assessment & Plan:

## 2019-06-21 NOTE — Assessment & Plan Note (Signed)
She appears to be overusing her oxygen.  On ambulation today oxygen saturation stayed good at 6 L both at rest and on exertion.  We will advise her to stay at this level.  1 using portable she can use 5 L pulse

## 2019-06-21 NOTE — Assessment & Plan Note (Addendum)
Continue Trelegy Use albuterol as needed as needed Smoking cessation again emphasized

## 2019-06-26 NOTE — Progress Notes (Signed)
HEMATOLOGY/ONCOLOGY CONSULTATION NOTE  Date of Service: 06/26/2019  Patient Care Team: Jonathon Jordan, MD as PCP - General (Family Medicine) Buford Dresser, MD as PCP - Cardiology (Cardiology)  CHIEF COMPLAINTS/PURPOSE OF CONSULTATION:  IDA  HISTORY OF PRESENTING ILLNESS:   Teresa Roberts is a wonderful 58 y.o. female who has been referred to [place] by Dr Jonathon Jordan for evaluation and management of IDA. The pt reports that she is doing well overall.  The pt reports than she has been anemic for half of her life. Her periods were not heavy when she was younger. She was not on PO Iron in the past (she is now) and has never required iron infusions or blood transfusions.   The pt has hemorrhoids and sometimes notes blood in her stools. She does not have a GI physician but had a colonoscopy in 2018 that was normal. At this time, she experiences a large amount bleeding due to her hemorrhoids about twice a month. She reports that before she has bowel movements, she experiences a lot of abdominal pain. The pt has never had an endoscopy.   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 She is on 8 Liters of oxygen at baseline. She has been postmenopausal since age 62 and stopped eating meat 10 months ago. She experiences syncopal episodes occasionally, the last episode was 3 months ago. The pt has been on Omeprazole for at least 1-2 years.   Most recent lab results (05/31/2019) of CBC is as follows: all values are WNL except for WBC at 12.8k, RBC at 3.83, HGB at 7.7, HCT at 26.7, MCV at 69.6, MCH at 20.1, MCHC at 29.0, RDW at 29.3. 05/31/2019 Ferritin at 7  On review of systems, pt reports belly pain before bowel movements, occasional syncopal episodes, blood in the stool, craving ice, and denies swallowing problems, belly pain after eating, unexpected weight loss and any other symptoms.   On PMHx the pt reports hx of MGUS, fibromyalgia, hemorrhoids  On Social Hx the pt reports  smoking cigarettes 1/2 pack daily  MEDICAL HISTORY:  Past Medical History:  Diagnosis Date  . CAD (coronary artery disease)    a. 01/2017: cath showing 20% mid RCA stenosis and otherwise normal LAD and LCx with a preserved EF of 55 to 60%; mod pulmonary HTN  . COPD (chronic obstructive pulmonary disease) ([place])    O2 dependent  . cpap   . Depression    PTSD  . Diastolic CHF ([place])   . Fibromyalgia   . Hyperlipidemia   . Hypertension   . Migraines     SURGICAL HISTORY: Past Surgical History:  Procedure Laterality Date  . APPENDECTOMY    . CESAREAN SECTION    . ORIF ANKLE FRACTURE Left 08/12/2017   Procedure: OPEN REDUCTION INTERNAL FIXATION (ORIF) ANKLE FRACTURE;  Surgeon: Rod Can, MD;  Location: [facility];  Service: Orthopedics;  Laterality: Left;  . RIGHT HEART CATH N/A 07/19/2018   Procedure: RIGHT HEART CATH;  Surgeon: Jolaine Artist, MD;  Location: [facility];  Service: Cardiovascular;  Laterality: N/A;  . RIGHT/LEFT HEART CATH AND CORONARY ANGIOGRAPHY N/A 01/27/2017   Procedure: Right/Left Heart Cath and Coronary Angiography;  Surgeon: Troy Sine, MD;  Location: [facility];  Service: Cardiovascular;  Laterality: N/A;    SOCIAL HISTORY: Social History   Socioeconomic History  . Marital status: Married    Spouse name: Not on file  . Number of children:  Not on file  . Years of education: Not on file  . Highest education level: Not on file  Occupational History  . Not on file  Social Needs  . Financial resource strain: Not on file  . Food insecurity    Worry: Not on file    Inability: Not on file  . Transportation needs    Medical: Not on file    Non-medical: Not on file  Tobacco Use  . Smoking status: Former Smoker    Packs/day: 1.00    Years: 39.00    Pack years: 39.00    Types: Cigarettes    Start date: 07/21/2018  . Smokeless tobacco: Never Used  . Tobacco comment: resumed smoking less than a pack a day  Substance and Sexual  Activity  . Alcohol use: No    Frequency: Never  . Drug use: No  . Sexual activity: Not on file  Lifestyle  . Physical activity    Days per week: Not on file    Minutes per session: Not on file  . Stress: Not on file  Relationships  . Social Herbalist on phone: Not on file    Gets together: Not on file    Attends religious service: Not on file    Active member of club or organization: Not on file    Attends meetings of clubs or organizations: Not on file    Relationship status: Not on file  . Intimate partner violence    Fear of current or ex partner: Not on file    Emotionally abused: Not on file    Physically abused: Not on file    Forced sexual activity: Not on file  Other Topics Concern  . Not on file  Social History Narrative  . Not on file    FAMILY HISTORY: Family History  Problem Relation Age of Onset  . Diabetes Mother   . Hypertension Mother   . CAD Mother   . Diabetes Father   . Lung cancer Father   . Diabetes Sister     ALLERGIES:  is allergic to citrus and lisinopril.  MEDICATIONS:  Current Outpatient Medications  Medication Sig Dispense Refill  . albuterol (PROVENTIL) (2.5 MG/3ML) 0.083% nebulizer solution Take 3 mLs (2.5 mg total) by nebulization every 6 (six) hours as needed for wheezing or shortness of breath. 75 mL 12  . amLODipine (NORVASC) 2.5 MG tablet Take 2.5 mg by mouth daily.  0  . aspirin 81 MG EC tablet Take 81 mg by mouth daily after lunch.  0  . azelastine (ASTELIN) 0.1 % nasal spray     . busPIRone (BUSPAR) 10 MG tablet Take 10 mg by mouth 2 (two) times daily.    Marland Kitchen docusate sodium (COLACE) 100 MG capsule Take 100 mg by mouth 3 (three) times daily.    . dorzolamide-timolol (COSOPT) 22.3-6.8 MG/ML ophthalmic solution Place 1 drop into both eyes 2 (two) times daily.    . DULoxetine (CYMBALTA) 30 MG capsule TAKE 1 CAPSULE BY MOUTH EVERYDAY AT BEDTIME 30 capsule 2  . Fluticasone-Umeclidin-Vilant (TRELEGY ELLIPTA) 100-62.5-25  MCG/INH AEPB Inhale 1 puff into the lungs daily. 28 each 3  . folic acid (FOLVITE) 1 MG tablet Take 1 mg by mouth daily after lunch.  0  . furosemide (LASIX) 40 MG tablet Take 1 tablet (40 mg total) by mouth daily. 30 tablet 11  . gabapentin (NEURONTIN) 800 MG tablet Take 800 mg by mouth 2 (two) times daily.    Marland Kitchen  guaiFENesin (MUCINEX) 600 MG 12 hr tablet Take 1,200 mg by mouth 2 (two) times daily as needed for cough or to loosen phlegm.    Marland Kitchen guaifenesin (ROBITUSSIN) 100 MG/5ML syrup Take 200 mg by mouth as needed for cough (at bedtime).    Marland Kitchen ipratropium-albuterol (DUONEB) 0.5-2.5 (3) MG/3ML SOLN Take 3 mLs by nebulization every 6 (six) hours as needed (Shortness of breath). 360 mL 1  . latanoprost (XALATAN) 0.005 % ophthalmic solution Place 1 drop into both eyes at bedtime.    . metaxalone (SKELAXIN) 800 MG tablet TAKE 1 TABLET (800 MG TOTAL) BY MOUTH 3 (THREE) TIMES DAILY AS NEEDED FOR MUSCLE SPASMS. 90 tablet 0  . modafinil (PROVIGIL) 100 MG tablet Take 100 mg by mouth daily.  4  . Multiple Vitamins-Minerals (HAIR/SKIN/NAILS/BIOTIN) TABS Take 1 tablet by mouth daily after lunch.    Marland Kitchen omeprazole (PRILOSEC) 20 MG capsule Take by mouth daily.  4  . OXYGEN Inhale 5 L into the lungs continuous.     . rizatriptan (MAXALT-MLT) 10 MG disintegrating tablet Take 10 mg by mouth as needed for migraine. May repeat in 2 hours if needed    . simvastatin (ZOCOR) 10 MG tablet Take 10 mg by mouth at bedtime.    Marland Kitchen tiZANidine (ZANAFLEX) 4 MG tablet TAKE 1 TABLET (4 MG TOTAL) BY MOUTH AT BEDTIME AS NEEDED FOR MUSCLE SPASMS. 30 tablet 0  . topiramate (TOPAMAX) 25 MG tablet Take 50 mg by mouth daily.     . traZODone (DESYREL) 100 MG tablet Take 100 mg by mouth at bedtime.     . vitamin B-12 (CYANOCOBALAMIN) 1000 MCG tablet Take 1,000 mcg by mouth daily after lunch.     No current facility-administered medications for this visit.     REVIEW OF SYSTEMS:    A 10+ POINT REVIEW OF SYSTEMS WAS OBTAINED including  neurology, dermatology, psychiatry, cardiac, respiratory, lymph, extremities, GI, GU, Musculoskeletal, constitutional, breasts, reproductive, HEENT.  All pertinent positives are noted in the HPI.  All others are negative.   PHYSICAL EXAMINATION: ECOG PERFORMANCE STATUS: 2 - Symptomatic, <50% confined to bed  . Vitals:   06/27/19 1115  BP: 119/77  Pulse: 96  Resp: 18  Temp: 98 F (36.7 C)  SpO2: 91%   Filed Weights   06/27/19 1115  Weight: 218 lb (98.9 kg)   .Body mass index is 35.19 kg/m.  GENERAL:alert, in no acute distress and comfortable SKIN: no acute rashes, no significant lesions EYES: conjunctiva are pink and non-injected, sclera anicteric OROPHARYNX: MMM, no exudates, no oropharyngeal erythema or ulceration NECK: supple, no JVD LYMPH:  no palpable lymphadenopathy in the cervical, axillary or inguinal regions LUNGS: clear to auscultation b/l with normal respiratory effort, pt is on 8 liters of oxygen at baseline HEART: regular rate & rhythm ABDOMEN:  normoactive bowel sounds , non tender, not distended. Extremity: no pedal edema PSYCH: alert & oriented x 3 with fluent speech NEURO: no focal motor/sensory deficits  LABORATORY DATA:  I have reviewed the data as listed  . CBC Latest Ref Rng & Units 09/23/2018 07/12/2018 05/27/2018  WBC 4.0 - 10.5 K/uL 8.0 7.4 7.9  Hemoglobin 12.0 - 15.0 g/dL 10.0(L) 12.7 10.7(L)  Hematocrit 36.0 - 46.0 % 38.3 44.0 37.9  Platelets 150 - 400 K/uL 247 206 124(L)    . CMP Latest Ref Rng & Units 09/23/2018 07/12/2018 05/29/2018  Glucose 70 - 99 mg/dL 101(H) 86 93  BUN 6 - 20 mg/dL 9 17 15   Creatinine 0.44 -  1.00 mg/dL 0.94 1.24(H) 0.95  Sodium 135 - 145 mmol/L 142 143 143  Potassium 3.5 - 5.1 mmol/L 3.9 4.1 3.7  Chloride 98 - 111 mmol/L 105 99 101  CO2 22 - 32 mmol/L 30 24 33(H)  Calcium 8.9 - 10.3 mg/dL 10.4(H) 10.9(H) 9.7  Total Protein 6.5 - 8.1 g/dL 7.7 - -  Total Bilirubin 0.3 - 1.2 mg/dL 0.5 - -  Alkaline Phos 38 - 126 U/L 111  - -  AST 15 - 41 U/L 17 - -  ALT 0 - 44 U/L 13 - -    06/03/2019 LABS   RADIOGRAPHIC STUDIES: I have personally reviewed the radiological images as listed and agreed with the findings in the report. No results found.  ASSESSMENT & PLAN:   #1 IDA- GI bleeding + Poor iron absorption from chronic PPI use. -05/31/2019 Ferritin at 7, RBC at 3.83 -Will schedule the pt for 2 doses of IV Iron today  #2 MGUS -Will order labs today  PLAN: -Discussed patient's most recent labs from 05/31/2019, pt is iron deficient and anemic, RBC at 3.83, Ferritin at 7 -Discussed that the most likely explanation of her iron deficiency is that she is losing iron through GI bleeding/hemorrhoids -Recommend F/U with GI for endoscopy and EGD to check for other explanations of blood loss -Discussed that PO iron may not be adequate because the pt may not absorb it well due to her hemorrhoids and acid suppressant use. -Given the severity of her iron deficiency and anemia, would recommend IV Iron x2 doses -Counseled the pt on smoking cessation -Recommend OTC B complex daily for at least 3 months   Labs today  IV INjectafer weekly x 2 doses ASAP RTC with Dr Irene Limbo with labs in 2 months   Orders Placed This Encounter  Procedures  . CBC with Differential/Platelet    Standing Status:   Future    Number of Occurrences:   1    Standing Expiration Date:   07/31/2020  . CMP ([facility] only)    Standing Status:   Future    Number of Occurrences:   1    Standing Expiration Date:   06/26/2020  . Multiple Myeloma Panel (SPEP&IFE w/QIG)    Standing Status:   Future    Number of Occurrences:   1    Standing Expiration Date:   06/26/2020  . Kappa/lambda light chains    Standing Status:   Future    Number of Occurrences:   1    Standing Expiration Date:   07/31/2020  . Sedimentation rate    Standing Status:   Future    Number of Occurrences:   1    Standing Expiration Date:   06/26/2020  . Ferritin    Standing  Status:   Future    Number of Occurrences:   1    Standing Expiration Date:   06/26/2020  . Iron and TIBC    Standing Status:   Future    Number of Occurrences:   1    Standing Expiration Date:   06/26/2020    All of the patients questions were answered with apparent satisfaction. The patient knows to call the clinic with any problems, questions or concerns.  I spent 25 counseling the patient face to face. The total time spent in the appointment was 35 mins and more than 50% was on counseling and direct patient cares.    Sullivan Lone MD [place] AAHIVMS [facility] Hematology/Oncology Physician [place]  [place]  (Office):       [phone] (Work cell):  [phone] (Fax):           [phone]  06/26/2019 4:22 PM  I, De Burrs, am acting as a scribe for Dr. Irene Limbo  .I have reviewed the above documentation for accuracy and completeness, and I agree with the above. Brunetta Genera MD

## 2019-06-27 ENCOUNTER — Telehealth: Payer: Self-pay | Admitting: Hematology

## 2019-06-27 ENCOUNTER — Other Ambulatory Visit: Payer: Self-pay

## 2019-06-27 ENCOUNTER — Inpatient Hospital Stay: Payer: 59 | Attending: Hematology | Admitting: Hematology

## 2019-06-27 ENCOUNTER — Inpatient Hospital Stay: Payer: 59

## 2019-06-27 VITALS — BP 119/77 | HR 96 | Temp 98.0°F | Resp 18 | Ht 66.0 in | Wt 218.0 lb

## 2019-06-27 DIAGNOSIS — D500 Iron deficiency anemia secondary to blood loss (chronic): Secondary | ICD-10-CM | POA: Diagnosis not present

## 2019-06-27 DIAGNOSIS — K649 Unspecified hemorrhoids: Secondary | ICD-10-CM | POA: Diagnosis not present

## 2019-06-27 DIAGNOSIS — J449 Chronic obstructive pulmonary disease, unspecified: Secondary | ICD-10-CM | POA: Diagnosis not present

## 2019-06-27 DIAGNOSIS — Z87891 Personal history of nicotine dependence: Secondary | ICD-10-CM | POA: Diagnosis not present

## 2019-06-27 DIAGNOSIS — Z801 Family history of malignant neoplasm of trachea, bronchus and lung: Secondary | ICD-10-CM | POA: Diagnosis not present

## 2019-06-27 DIAGNOSIS — D509 Iron deficiency anemia, unspecified: Secondary | ICD-10-CM | POA: Insufficient documentation

## 2019-06-27 DIAGNOSIS — D472 Monoclonal gammopathy: Secondary | ICD-10-CM

## 2019-06-27 DIAGNOSIS — Z79899 Other long term (current) drug therapy: Secondary | ICD-10-CM | POA: Insufficient documentation

## 2019-06-27 DIAGNOSIS — K921 Melena: Secondary | ICD-10-CM

## 2019-06-27 DIAGNOSIS — R109 Unspecified abdominal pain: Secondary | ICD-10-CM

## 2019-06-27 DIAGNOSIS — M797 Fibromyalgia: Secondary | ICD-10-CM | POA: Diagnosis not present

## 2019-06-27 DIAGNOSIS — K922 Gastrointestinal hemorrhage, unspecified: Secondary | ICD-10-CM

## 2019-06-27 DIAGNOSIS — Z8249 Family history of ischemic heart disease and other diseases of the circulatory system: Secondary | ICD-10-CM

## 2019-06-27 DIAGNOSIS — Z833 Family history of diabetes mellitus: Secondary | ICD-10-CM | POA: Insufficient documentation

## 2019-06-27 DIAGNOSIS — D649 Anemia, unspecified: Secondary | ICD-10-CM

## 2019-06-27 LAB — CBC WITH DIFFERENTIAL/PLATELET
Abs Immature Granulocytes: 0.01 10*3/uL (ref 0.00–0.07)
Basophils Absolute: 0.1 10*3/uL (ref 0.0–0.1)
Basophils Relative: 1 %
Eosinophils Absolute: 0.2 10*3/uL (ref 0.0–0.5)
Eosinophils Relative: 3 %
HCT: 37.9 % (ref 36.0–46.0)
Hemoglobin: 11 g/dL — ABNORMAL LOW (ref 12.0–15.0)
Immature Granulocytes: 0 %
Lymphocytes Relative: 15 %
Lymphs Abs: 0.9 10*3/uL (ref 0.7–4.0)
MCH: 24.2 pg — ABNORMAL LOW (ref 26.0–34.0)
MCHC: 29 g/dL — ABNORMAL LOW (ref 30.0–36.0)
MCV: 83.5 fL (ref 80.0–100.0)
Monocytes Absolute: 0.3 10*3/uL (ref 0.1–1.0)
Monocytes Relative: 5 %
Neutro Abs: 4.9 10*3/uL (ref 1.7–7.7)
Neutrophils Relative %: 76 %
Platelets: 258 10*3/uL (ref 150–400)
RBC: 4.54 MIL/uL (ref 3.87–5.11)
WBC: 6.3 10*3/uL (ref 4.0–10.5)
nRBC: 0 % (ref 0.0–0.2)

## 2019-06-27 LAB — CMP (CANCER CENTER ONLY)
ALT: 17 U/L (ref 0–44)
AST: 16 U/L (ref 15–41)
Albumin: 3.9 g/dL (ref 3.5–5.0)
Alkaline Phosphatase: 130 U/L — ABNORMAL HIGH (ref 38–126)
Anion gap: 13 (ref 5–15)
BUN: 10 mg/dL (ref 6–20)
CO2: 28 mmol/L (ref 22–32)
Calcium: 10.7 mg/dL — ABNORMAL HIGH (ref 8.9–10.3)
Chloride: 102 mmol/L (ref 98–111)
Creatinine: 1.07 mg/dL — ABNORMAL HIGH (ref 0.44–1.00)
GFR, Est AFR Am: >60 mL/min (ref >60)
GFR, Estimated: 58 mL/min — ABNORMAL LOW (ref >60)
Glucose, Bld: 96 mg/dL (ref 70–99)
Potassium: 4 mmol/L (ref 3.5–5.1)
Sodium: 143 mmol/L (ref 135–145)
Total Bilirubin: 0.2 mg/dL — ABNORMAL LOW (ref 0.3–1.2)
Total Protein: 7.6 g/dL (ref 6.5–8.1)

## 2019-06-27 LAB — IRON AND TIBC
Iron: 285 ug/dL — ABNORMAL HIGH (ref 41–142)
Saturation Ratios: 62 % — ABNORMAL HIGH (ref 21–57)
TIBC: 456 ug/dL — ABNORMAL HIGH (ref 236–444)
UIBC: 172 ug/dL (ref 120–384)

## 2019-06-27 LAB — SEDIMENTATION RATE: Sed Rate: 5 mm/hr (ref 0–22)

## 2019-06-27 LAB — FERRITIN: Ferritin: 50 ng/mL (ref 11–307)

## 2019-06-27 NOTE — Patient Instructions (Signed)
Thank you for choosing [facility] to provide your oncology and hematology care.   Should you have questions after your visit to the [facility] ([facility]), please contact this office at [phone] between 8:30 AM and 4:30 PM. Voicemails left after 4:00 PM may not be returned until the following business day. Calls received after 4:30 PM will be answered by an off-site Nurse Triage Line.    Prescription Refills:  Please have your pharmacy contact us directly for most prescription requests.  Contact the office directly for refills of narcotics (pain medications). Allow 48-72 hours for refills.  Appointments: Please contact the [facility] scheduling department [phone] for questions regarding [facility] appointment scheduling.  Contact the schedulers with any scheduling changes so that your appointment can be rescheduled in a timely manner.   Central Scheduling for [facility] [phone] - Call to schedule procedures such as PET scans, CT scans, MRI, Ultrasound, etc.  To afford each patient quality time with our providers, please arrive 30 minutes before your scheduled appointment time.  If you arrive late for your appointment, you may be asked to reschedule.  We strive to give you quality time with our providers, and arriving late affects you and other patients whose appointments are after yours. If you are a no show for multiple scheduled visits, you may be dismissed from the clinic at the providers discretion.     Resources: [facility] Social Workers [phone] for additional information on assistance programs --Anne Cunningham/Abigail Elmore  [place] DSS  [phone]: Information regarding food stamps, Medicaid, and utility assistance [employer] 336-333-6589   [employer]'s shared-ride transportation service for eligible riders who have a disability that prevents them from riding the fixed route bus.   Medicare Rights Center 800-333-4114 Helps people with  Medicare understand their rights and benefits, navigate the Medicare system, and secure the quality healthcare they deserve [employer] 800-227-2345 Assists patients locate various types of support and financial assistance Cancer Care: 1-800-813-HOPE (4673) Provides financial assistance, online support groups, medication/co-pay assistance.      

## 2019-06-27 NOTE — Telephone Encounter (Signed)
Scheduled appt per 8/12 los.  Will call and inform patient of her appt dates and time.

## 2019-06-28 ENCOUNTER — Other Ambulatory Visit: Payer: Self-pay

## 2019-06-28 LAB — KAPPA/LAMBDA LIGHT CHAINS
Kappa free light chain: 19.2 mg/L (ref 3.3–19.4)
Kappa, lambda light chain ratio: 1.83 — ABNORMAL HIGH (ref 0.26–1.65)
Lambda free light chains: 10.5 mg/L (ref 5.7–26.3)

## 2019-06-29 ENCOUNTER — Telehealth: Payer: Self-pay | Admitting: Hematology

## 2019-06-29 ENCOUNTER — Ambulatory Visit: Payer: 59

## 2019-06-29 LAB — MULTIPLE MYELOMA PANEL, SERUM
Albumin SerPl Elph-Mcnc: 3.8 g/dL (ref 2.9–4.4)
Albumin/Glob SerPl: 1.4 (ref 0.7–1.7)
Alpha 1: 0.2 g/dL (ref 0.0–0.4)
Alpha2 Glob SerPl Elph-Mcnc: 0.6 g/dL (ref 0.4–1.0)
B-Globulin SerPl Elph-Mcnc: 1.3 g/dL (ref 0.7–1.3)
Gamma Glob SerPl Elph-Mcnc: 0.8 g/dL (ref 0.4–1.8)
Globulin, Total: 2.9 g/dL (ref 2.2–3.9)
IgA: 231 mg/dL (ref 87–352)
IgG (Immunoglobin G), Serum: 980 mg/dL (ref 586–1602)
IgM (Immunoglobulin M), Srm: 136 mg/dL (ref 26–217)
Total Protein ELP: 6.7 g/dL (ref 6.0–8.5)

## 2019-06-29 NOTE — Telephone Encounter (Signed)
Per 8/14 schedule message reschedule 8/14 infusion. Patient was on schedule 8/14 and 8/21. Added infusion 8/28. Confirmed with patient 8/21 and 8/28 appointments.

## 2019-06-30 ENCOUNTER — Other Ambulatory Visit: Payer: Self-pay | Admitting: Rheumatology

## 2019-07-02 NOTE — Telephone Encounter (Signed)
Last Visit: 01/24/19 Next Visit: 07/25/19   Okay to refill per Dr. Estanislado Pandy

## 2019-07-03 ENCOUNTER — Other Ambulatory Visit: Payer: Self-pay | Admitting: Hematology

## 2019-07-05 DIAGNOSIS — D649 Anemia, unspecified: Secondary | ICD-10-CM | POA: Diagnosis not present

## 2019-07-05 DIAGNOSIS — L723 Sebaceous cyst: Secondary | ICD-10-CM | POA: Diagnosis not present

## 2019-07-05 DIAGNOSIS — Z6835 Body mass index (BMI) 35.0-35.9, adult: Secondary | ICD-10-CM | POA: Diagnosis not present

## 2019-07-06 ENCOUNTER — Inpatient Hospital Stay: Payer: 59

## 2019-07-06 ENCOUNTER — Other Ambulatory Visit: Payer: Self-pay

## 2019-07-06 VITALS — BP 134/81 | HR 88 | Temp 98.6°F | Resp 16

## 2019-07-06 DIAGNOSIS — D500 Iron deficiency anemia secondary to blood loss (chronic): Secondary | ICD-10-CM

## 2019-07-06 MED ORDER — SODIUM CHLORIDE 0.9 % IV SOLN
Freq: Once | INTRAVENOUS | Status: AC
  Administered 2019-07-06: 08:00:00 via INTRAVENOUS
  Filled 2019-07-06: qty 250

## 2019-07-06 MED ORDER — SODIUM CHLORIDE 0.9 % IV SOLN
200.0000 mg | Freq: Once | INTRAVENOUS | Status: AC
  Administered 2019-07-06: 200 mg via INTRAVENOUS
  Filled 2019-07-06: qty 10

## 2019-07-06 MED ORDER — LORATADINE 10 MG PO TABS
10.0000 mg | ORAL_TABLET | Freq: Once | ORAL | Status: AC
  Administered 2019-07-06: 10 mg via ORAL

## 2019-07-06 MED ORDER — FAMOTIDINE IN NACL 20-0.9 MG/50ML-% IV SOLN
20.0000 mg | Freq: Once | INTRAVENOUS | Status: AC
  Administered 2019-07-06: 20 mg via INTRAVENOUS

## 2019-07-06 MED ORDER — FAMOTIDINE IN NACL 20-0.9 MG/50ML-% IV SOLN
INTRAVENOUS | Status: AC
  Filled 2019-07-06: qty 50

## 2019-07-06 MED ORDER — LORATADINE 10 MG PO TABS
ORAL_TABLET | ORAL | Status: AC
  Filled 2019-07-06: qty 1

## 2019-07-06 MED ORDER — ACETAMINOPHEN 325 MG PO TABS
ORAL_TABLET | ORAL | Status: AC
  Filled 2019-07-06: qty 2

## 2019-07-06 MED ORDER — ACETAMINOPHEN 325 MG PO TABS
650.0000 mg | ORAL_TABLET | Freq: Once | ORAL | Status: AC
  Administered 2019-07-06: 650 mg via ORAL

## 2019-07-06 NOTE — Patient Instructions (Signed)
Iron Sucrose injection What is this medicine? IRON SUCROSE (AHY ern SOO krohs) is an iron complex. Iron is used to make healthy red blood cells, which carry oxygen and nutrients throughout the body. This medicine is used to treat iron deficiency anemia in people with chronic kidney disease. This medicine may be used for other purposes; ask your health care provider or pharmacist if you have questions. COMMON BRAND NAME(S): Venofer What should I tell my health care provider before I take this medicine? They need to know if you have any of these conditions:  anemia not caused by low iron levels  heart disease  high levels of iron in the blood  kidney disease  liver disease  an unusual or allergic reaction to iron, other medicines, foods, dyes, or preservatives  pregnant or trying to get pregnant  breast-feeding How should I use this medicine? This medicine is for infusion into a vein. It is given by a health care professional in a hospital or clinic setting. Talk to your pediatrician regarding the use of this medicine in children. While this drug may be prescribed for children as Riva as 2 years for selected conditions, precautions do apply. Overdosage: If you think you have taken too much of this medicine contact a poison control center or emergency room at once. NOTE: This medicine is only for you. Do not share this medicine with others. What if I miss a dose? It is important not to miss your dose. Call your doctor or health care professional if you are unable to keep an appointment. What may interact with this medicine? Do not take this medicine with any of the following medications:  deferoxamine  dimercaprol  other iron products This medicine may also interact with the following medications:  chloramphenicol  deferasirox This list may not describe all possible interactions. Give your health care provider a list of all the medicines, herbs, non-prescription drugs, or  dietary supplements you use. Also tell them if you smoke, drink alcohol, or use illegal drugs. Some items may interact with your medicine. What should I watch for while using this medicine? Visit your doctor or healthcare professional regularly. Tell your doctor or healthcare professional if your symptoms do not start to get better or if they get worse. You may need blood work done while you are taking this medicine. You may need to follow a special diet. Talk to your doctor. Foods that contain iron include: whole grains/cereals, dried fruits, beans, or peas, leafy green vegetables, and organ meats (liver, kidney). What side effects may I notice from receiving this medicine? Side effects that you should report to your doctor or health care professional as soon as possible:  allergic reactions like skin rash, itching or hives, swelling of the face, lips, or tongue  breathing problems  changes in blood pressure  cough  fast, irregular heartbeat  feeling faint or lightheaded, falls  fever or chills  flushing, sweating, or hot feelings  joint or muscle aches/pains  seizures  swelling of the ankles or feet  unusually weak or tired Side effects that usually do not require medical attention (report to your doctor or health care professional if they continue or are bothersome):  diarrhea  feeling achy  headache  irritation at site where injected  nausea, vomiting  stomach upset  tiredness This list may not describe all possible side effects. Call your doctor for medical advice about side effects. You may report side effects to FDA at 1-800-FDA-1088. Where should I keep   my medicine? This drug is given in a hospital or clinic and will not be stored at home. NOTE: This sheet is a summary. It may not cover all possible information. If you have questions about this medicine, talk to your doctor, pharmacist, or health care provider.  2020 Elsevier/Gold Standard (2011-08-12  17:14:35) Coronavirus (COVID-19) Are you at risk?  Are you at risk for the Coronavirus (COVID-19)?  To be considered HIGH RISK for Coronavirus (COVID-19), you have to meet the following criteria:  . Traveled to [place], [place], [place], [place] or [place]; or in the [place] to [place], [place], [place], or [place]; and have fever, cough, and shortness of breath within the last 2 weeks of travel OR . Been in close contact with a person diagnosed with COVID-19 within the last 2 weeks and have fever, cough, and shortness of breath . IF YOU DO NOT MEET THESE CRITERIA, YOU ARE CONSIDERED LOW RISK FOR COVID-19.  What to do if you are HIGH RISK for COVID-19?  . If you are having a medical emergency, call 911. . Seek medical care right away. Before you go to a doctor's office, urgent care or emergency department, call ahead and tell them about your recent travel, contact with someone diagnosed with COVID-19, and your symptoms. You should receive instructions from your physician's office regarding next steps of care.  . When you arrive at healthcare provider, tell the healthcare staff immediately you have returned from visiting [place], [place], [place], [place] or [place]; or traveled in the [place] to [place], [place], [place], or [place]; in the last two weeks or you have been in close contact with a person diagnosed with COVID-19 in the last 2 weeks.   . Tell the health care staff about your symptoms: fever, cough and shortness of breath. . After you have been seen by a medical provider, you will be either: o Tested for (COVID-19) and discharged home on quarantine except to seek medical care if symptoms worsen, and asked to  - Stay home and avoid contact with others until you get your results (4-5 days)  - Avoid travel on public transportation if possible (such as bus, train, or airplane) or o Sent to the Emergency Department by EMS for evaluation, COVID-19 testing, and  possible admission depending on your condition and test results.  What to do if you are LOW RISK for COVID-19?  Reduce your risk of any infection by using the same precautions used for avoiding the common cold or flu:  . Wash your hands often with soap and warm water for at least 20 seconds.  If soap and water are not readily available, use an alcohol-based hand sanitizer with at least 60% alcohol.  . If coughing or sneezing, cover your mouth and nose by coughing or sneezing into the elbow areas of your shirt or coat, into a tissue or into your sleeve (not your hands). . Avoid shaking hands with others and consider head nods or verbal greetings only. . Avoid touching your eyes, nose, or mouth with unwashed hands.  . Avoid close contact with people who are sick. . Avoid places or events with large numbers of people in one location, like concerts or sporting events. . Carefully consider travel plans you have or are making. . If you are planning any travel outside or inside the [place], visit the CDC's Travelers' Health webpage for the latest health notices. . If you have some symptoms but not all   symptoms, continue to monitor at home and seek medical attention if your symptoms worsen. . If you are having a medical emergency, call 911.   ADDITIONAL HEALTHCARE OPTIONS FOR PATIENTS  [facility] / e-Visit: [email or web address]         [facility]: [phone]  [facility]: [phone]                   [facility]: [phone]  

## 2019-07-11 ENCOUNTER — Other Ambulatory Visit: Payer: Self-pay | Admitting: Pulmonary Disease

## 2019-07-11 DIAGNOSIS — Z6835 Body mass index (BMI) 35.0-35.9, adult: Secondary | ICD-10-CM | POA: Diagnosis not present

## 2019-07-11 DIAGNOSIS — Z79899 Other long term (current) drug therapy: Secondary | ICD-10-CM | POA: Diagnosis not present

## 2019-07-11 NOTE — Progress Notes (Signed)
Office Visit Note  Patient: Teresa Roberts             Date of Birth: October 25, 1961           MRN: [id number]             PCP: Jonathon Jordan, MD Referring: Jonathon Jordan, MD Visit Date: 07/25/2019 Occupation: @GUAROCC @  Subjective:  Trapezius muscle tension and tenderness   History of Present Illness: Teresa Roberts is a 58 y.o. female with history of fibromyalgia.  She continues have generalized muscle aches muscle tenderness due to fibromyalgia.  She has been having increased pain recently.  She currently rates her pain a 6 out of 10.  She has trapezius muscle tension and muscle tenderness bilaterally.  She would like trigger point injections today.  She continues to have trochanter bursitis bilaterally despite stretching on a regular basis.  She denies any joint pain or joint swelling at this time.  She continues to take Skelaxin and Zanaflex as prescribed.  She is no longer taking Cymbalta.  She states that she feels as though her fatigue has been worsening.  She has been sleeping about 8 hours per night.  She has been walking some for exercise but continues to be on 6 L of oxygen.   Activities of Daily Living:  Patient reports morning stiffness for 1-2 hours.   Patient Reports nocturnal pain.  Difficulty dressing/grooming: Reports Difficulty climbing stairs: Reports Difficulty getting out of chair: Reports Difficulty using hands for taps, buttons, cutlery, and/or writing: Reports  Review of Systems  Constitutional: Positive for fatigue.  HENT: Positive for mouth dryness and nose dryness. Negative for mouth sores.   Eyes: Positive for dryness. Negative for itching.  Respiratory: Negative for shortness of breath and difficulty breathing.   Cardiovascular: Negative for chest pain and palpitations.  Gastrointestinal: Negative for blood in stool, constipation and diarrhea.  Endocrine: Negative for increased urination.  Genitourinary: Negative for difficulty urinating and painful urination.   Musculoskeletal: Positive for arthralgias, joint pain and morning stiffness. Negative for joint swelling.  Skin: Positive for rash.  Allergic/Immunologic: Negative for susceptible to infections.  Neurological: Positive for dizziness and weakness. Negative for headaches and memory loss.  Hematological: Negative for swollen glands.  Psychiatric/Behavioral: Positive for confusion. Negative for sleep disturbance.    PMFS History:  Patient Active Problem List   Diagnosis Date Noted   Iron deficiency anemia due to chronic blood loss 06/27/2019   Coronary artery disease involving native coronary artery of native heart without angina pectoris 09/05/2018   Syncope and collapse 09/05/2018   Pain in joint of left elbow 09/04/2018   Chest pain 05/27/2018   HLD (hyperlipidemia) 05/27/2018   Essential hypertension 05/27/2018   Depression 05/27/2018   OSA (obstructive sleep apnea) 05/03/2018   Pulmonary hypertension ([facility]) 05/03/2018   Bimalleolar fracture of left ankle 08/12/2017   Acute renal failure (ARF) ([facility]) 08/09/2017   Chronic diastolic (congestive) heart failure ([facility]) 08/09/2017   Other fatigue 06/13/2017   Primary insomnia 06/13/2017   History of migraine 06/13/2017   COPD (chronic obstructive pulmonary disease) ([facility]) 02/15/2017   Tobacco abuse 02/15/2017   Respiratory failure with hypoxia ([facility]) 01/28/2017   Migraine 01/28/2017   Daily headache    Hypoxia    Non-ST elevation myocardial infarction (NSTEMI), type 2 01/26/2017   MGUS (monoclonal gammopathy of unknown significance) 01/11/2012   Fibromyalgia 01/11/2012   Bruises easily 01/11/2012    Past Medical History:  Diagnosis Date   CAD (coronary artery disease)  a. 01/2017: cath showing 20% mid RCA stenosis and otherwise normal LAD and LCx with a preserved EF of 55 to 60%; mod pulmonary HTN   COPD (chronic obstructive pulmonary disease) ([facility])    O2 dependent   cpap    Depression    PTSD    Diastolic CHF ([facility])    Fibromyalgia    Hyperlipidemia    Hypertension    Migraines     Family History  Problem Relation Age of Onset   Diabetes Mother    Hypertension Mother    CAD Mother    Diabetes Father    Lung cancer Father    Diabetes Sister    Past Surgical History:  Procedure Laterality Date   APPENDECTOMY     CESAREAN SECTION     ORIF ANKLE FRACTURE Left 08/12/2017   Procedure: OPEN REDUCTION INTERNAL FIXATION (ORIF) ANKLE FRACTURE;  Surgeon: Rod Can, MD;  Location: [facility];  Service: Orthopedics;  Laterality: Left;   RIGHT HEART CATH N/A 07/19/2018   Procedure: RIGHT HEART CATH;  Surgeon: Jolaine Artist, MD;  Location: [facility];  Service: Cardiovascular;  Laterality: N/A;   RIGHT/LEFT HEART CATH AND CORONARY ANGIOGRAPHY N/A 01/27/2017   Procedure: Right/Left Heart Cath and Coronary Angiography;  Surgeon: Troy Sine, MD;  Location: [facility];  Service: Cardiovascular;  Laterality: N/A;   Social History   Social History Narrative   Not on file   Immunization History  Administered Date(s) Administered   Influenza Split 08/15/2017   Influenza,inj,Quad PF,6+ Mos 09/04/2018   Influenza-Unspecified 08/15/2016   Pneumococcal-Unspecified 02/13/2013     Objective: Vital Signs: BP 134/89 (BP Location: Left Arm, Patient Position: Sitting, Cuff Size: Normal)    Pulse 89    Resp 15    Ht 5\' 6"  (1.676 m)    Wt 211 lb (95.7 kg)    LMP 08/19/2012    BMI 34.06 kg/m    Physical Exam Vitals signs and nursing note reviewed.  Constitutional:      Appearance: She is well-developed.  HENT:     Head: Normocephalic and atraumatic.  Eyes:     Conjunctiva/sclera: Conjunctivae normal.  Neck:     Musculoskeletal: Normal range of motion.  Cardiovascular:     Rate and Rhythm: Normal rate and regular rhythm.     Heart sounds: Normal heart sounds.  Pulmonary:     Effort: Pulmonary effort is normal.     Breath sounds: Normal breath  sounds.  Abdominal:     General: Bowel sounds are normal.     Palpations: Abdomen is soft.  Lymphadenopathy:     Cervical: No cervical adenopathy.  Skin:    General: Skin is warm and dry.     Capillary Refill: Capillary refill takes less than 2 seconds.  Neurological:     Mental Status: She is alert and oriented to person, place, and time.  Psychiatric:        Behavior: Behavior normal.      Musculoskeletal Exam: C-spine limited range of motion.  Thoracic and lumbar spine good range of motion.  She has midline spinal tenderness in the lumbar region.  No SI joint tenderness.  Shoulder joints limited abduction to 90 degrees with discomfort. Elbow joints, wrist joints, MCPs, PIPs, DIPs good range of motion no synovitis.  She has complete fist formation bilaterally.  Hip joints, knee joints, ankle joints, MTPs, PIPs, DIPs good range of motion no synovitis.  No warmth or effusion of bilateral knee  joints.  No tenderness or swelling of ankle joints.  She has tenderness over bilateral trochanteric bursa.  CDAI Exam: CDAI Score: -- Patient Global: --; Provider Global: -- Swollen: --; Tender: -- Joint Exam   No joint exam has been documented for this visit   There is currently no information documented on the homunculus. Go to the Rheumatology activity and complete the homunculus joint exam.  Investigation: No additional findings.  Imaging: Xr Lumbar Spine 2-3 Views  Result Date: 07/25/2019 No significant disc space narrowing was noted.  No facet joint arthropathies was noted.  Mild anterior spurring was noted.  No SI joint sclerosis was noted. Impression: Unremarkable x-ray of the lumbar spine.   Recent Labs: Lab Results  Component Value Date   WBC 6.3 06/27/2019   HGB 11.0 (L) 06/27/2019   PLT 258 06/27/2019   NA 143 06/27/2019   K 4.0 06/27/2019   CL 102 06/27/2019   CO2 28 06/27/2019   GLUCOSE 96 06/27/2019   BUN 10 06/27/2019   CREATININE 1.07 (H) 06/27/2019   BILITOT 0.2  (L) 06/27/2019   ALKPHOS 130 (H) 06/27/2019   AST 16 06/27/2019   ALT 17 06/27/2019   PROT 7.6 06/27/2019   ALBUMIN 3.9 06/27/2019   CALCIUM 10.7 (H) 06/27/2019   GFRAA >60 06/27/2019    Speciality Comments: No specialty comments available.  Procedures:  Trigger Point Inj  Date/Time: 07/25/2019 11:22 AM Performed by: Bo Merino, MD Authorized by: Bo Merino, MD   Consent Given by:  Patient Site marked: the procedure site was marked   Timeout: prior to procedure the correct patient, procedure, and site was verified   Indications:  Pain Total # of Trigger Points:  2 Location: neck   Needle Size:  27 G Approach:  Dorsal Medications #1:  0.5 mL lidocaine 1 %; 10 mg triamcinolone acetonide 40 MG/ML Medications #2:  0.5 mL lidocaine 1 %; 10 mg triamcinolone acetonide 40 MG/ML Patient tolerance:  Patient tolerated the procedure well with no immediate complications   Allergies: Citrus and Lisinopril   Assessment / Plan:     Visit Diagnoses: Fibromyalgia - She has generalized muscle aches and muscle tenderness.  She has generalized hyperalgesia and positive tender points on exam.  She has been having more severe fibromyalgia flares recently.  She has trapezius muscle tension and muscle tenderness bilaterally.  She requested trigger point injections today.  She continues to take Skelaxin and Zanaflex as prescribed for muscle spasms.  She is no longer taking Cymbalta.  She continues to trochanter bursitis bilaterally.  She was encouraged to continue to perform stretching exercises on a regular basis.  Her fatigue has been worsening recently.  She has been sleeping well at night after taking trazodone 100 mg by mouth at bedtime.  The importance of regular exercise and good sleep hygiene was discussed.  She will follow-up in the office in 6 months.   Trapezius muscle spasm: She presents today with trapezius muscle tension and muscle tenderness bilaterally.  She has been having  more frequent muscle spasms.  She takes Skelaxin and Zanaflex as prescribed.  She requested bilateral trigger point injections.  She tolerated the procedure well.  The procedure note was completed above.  Chronic midline low back pain without sciatica - She presents today with increased lower back pain.  She has midline spinal tenderness in the lumbar region.  She has not had any symptoms of radiculopathy or sciatica at this time.  The pain has become more severe  over the past several months.  She has been taking Skelaxin and Zanaflex as needed for muscle spasms.  X-rays of the lumbar spine were obtained today.  Handout of back exercises were provided to the patient plan: XR Lumbar Spine 2-3 Views  Trochanteric bursitis of both hips: She has tenderness over bilateral trochanter bursa.  She was encouraged to perform stretching exercises on a regular basis.  Other fatigue: She has chronic fatigue which she feels has been worsening.   Primary insomnia -She takes trazodone 100 mg  by mouth at bedtime for insomnia.  She sleeps about 8 hours per night.  Good sleep hygiene was discussed  Other medical conditions are listed as follows:   MGUS (monoclonal gammopathy of unknown significance)  History of migraine  History of COPD  Former smoker    Orders: Orders Placed This Encounter  Procedures   Trigger Point Inj   XR Lumbar Spine 2-3 Views   No orders of the defined types were placed in this encounter.   Face-to-face time spent with patient was 30 minutes. Greater than 50% of time was spent in counseling and coordination of care.  Follow-Up Instructions: Return in about 6 months (around 01/22/2020) for Fibromyalgia.   Ofilia Neas, PA-C  Note - This record has been created using Dragon software.  Chart creation errors have been sought, but may not always  have been located. Such creation errors do not reflect on  the standard of medical care.

## 2019-07-12 ENCOUNTER — Other Ambulatory Visit: Payer: Self-pay | Admitting: Rheumatology

## 2019-07-12 NOTE — Telephone Encounter (Signed)
Last Visit: 01/24/19 Next Visit: 07/25/19  Okay to refill per Dr. Deveshwar   

## 2019-07-13 ENCOUNTER — Inpatient Hospital Stay: Payer: 59

## 2019-07-13 ENCOUNTER — Other Ambulatory Visit: Payer: Self-pay

## 2019-07-13 VITALS — BP 124/80 | HR 76 | Temp 98.2°F | Resp 20

## 2019-07-13 DIAGNOSIS — D500 Iron deficiency anemia secondary to blood loss (chronic): Secondary | ICD-10-CM | POA: Diagnosis not present

## 2019-07-13 MED ORDER — FAMOTIDINE IN NACL 20-0.9 MG/50ML-% IV SOLN
INTRAVENOUS | Status: AC
  Filled 2019-07-13: qty 50

## 2019-07-13 MED ORDER — FAMOTIDINE IN NACL 20-0.9 MG/50ML-% IV SOLN
20.0000 mg | Freq: Once | INTRAVENOUS | Status: AC
  Administered 2019-07-13: 20 mg via INTRAVENOUS

## 2019-07-13 MED ORDER — ACETAMINOPHEN 325 MG PO TABS
650.0000 mg | ORAL_TABLET | Freq: Once | ORAL | Status: AC
  Administered 2019-07-13: 09:00:00 650 mg via ORAL

## 2019-07-13 MED ORDER — LORATADINE 10 MG PO TABS
10.0000 mg | ORAL_TABLET | Freq: Once | ORAL | Status: AC
  Administered 2019-07-13: 09:00:00 10 mg via ORAL

## 2019-07-13 MED ORDER — LORATADINE 10 MG PO TABS
ORAL_TABLET | ORAL | Status: AC
  Filled 2019-07-13: qty 1

## 2019-07-13 MED ORDER — SODIUM CHLORIDE 0.9 % IV SOLN
Freq: Once | INTRAVENOUS | Status: AC
  Administered 2019-07-13: 09:00:00 via INTRAVENOUS
  Filled 2019-07-13: qty 250

## 2019-07-13 MED ORDER — ACETAMINOPHEN 325 MG PO TABS
ORAL_TABLET | ORAL | Status: AC
  Filled 2019-07-13: qty 2

## 2019-07-13 MED ORDER — SODIUM CHLORIDE 0.9 % IV SOLN
200.0000 mg | Freq: Once | INTRAVENOUS | Status: AC
  Administered 2019-07-13: 10:00:00 200 mg via INTRAVENOUS
  Filled 2019-07-13: qty 10

## 2019-07-13 NOTE — Patient Instructions (Signed)
Iron Sucrose injection What is this medicine? IRON SUCROSE (AHY ern SOO krohs) is an iron complex. Iron is used to make healthy red blood cells, which carry oxygen and nutrients throughout the body. This medicine is used to treat iron deficiency anemia in people with chronic kidney disease. This medicine may be used for other purposes; ask your health care provider or pharmacist if you have questions. COMMON BRAND NAME(S): Venofer What should I tell my health care provider before I take this medicine? They need to know if you have any of these conditions:  anemia not caused by low iron levels  heart disease  high levels of iron in the blood  kidney disease  liver disease  an unusual or allergic reaction to iron, other medicines, foods, dyes, or preservatives  pregnant or trying to get pregnant  breast-feeding How should I use this medicine? This medicine is for infusion into a vein. It is given by a health care professional in a hospital or clinic setting. Talk to your pediatrician regarding the use of this medicine in children. While this drug may be prescribed for children as [place] as 2 years for selected conditions, precautions do apply. Overdosage: If you think you have taken too much of this medicine contact a poison control center or emergency room at once. NOTE: This medicine is only for you. Do not share this medicine with others. What if I miss a dose? It is important not to miss your dose. Call your doctor or health care professional if you are unable to keep an appointment. What may interact with this medicine? Do not take this medicine with any of the following medications:  deferoxamine  dimercaprol  other iron products This medicine may also interact with the following medications:  chloramphenicol  deferasirox This list may not describe all possible interactions. Give your health care provider a list of all the medicines, herbs, non-prescription drugs, or  dietary supplements you use. Also tell them if you smoke, drink alcohol, or use illegal drugs. Some items may interact with your medicine. What should I watch for while using this medicine? Visit your doctor or healthcare professional regularly. Tell your doctor or healthcare professional if your symptoms do not start to get better or if they get worse. You may need blood work done while you are taking this medicine. You may need to follow a special diet. Talk to your doctor. Foods that contain iron include: whole grains/cereals, dried fruits, beans, or peas, leafy green vegetables, and organ meats (liver, kidney). What side effects may I notice from receiving this medicine? Side effects that you should report to your doctor or health care professional as soon as possible:  allergic reactions like skin rash, itching or hives, swelling of the face, lips, or tongue  breathing problems  changes in blood pressure  cough  fast, irregular heartbeat  feeling faint or lightheaded, falls  fever or chills  flushing, sweating, or hot feelings  joint or muscle aches/pains  seizures  swelling of the ankles or feet  unusually weak or tired Side effects that usually do not require medical attention (report to your doctor or health care professional if they continue or are bothersome):  diarrhea  feeling achy  headache  irritation at site where injected  nausea, vomiting  stomach upset  tiredness This list may not describe all possible side effects. Call your doctor for medical advice about side effects. You may report side effects to FDA at 1-800-FDA-1088. Where should I keep   my medicine? This drug is given in a hospital or clinic and will not be stored at home. NOTE: This sheet is a summary. It may not cover all possible information. If you have questions about this medicine, talk to your doctor, pharmacist, or health care provider.  2020 Elsevier/Gold Standard (2011-08-12  17:14:35) Coronavirus (COVID-19) Are you at risk?  Are you at risk for the Coronavirus (COVID-19)?  To be considered HIGH RISK for Coronavirus (COVID-19), you have to meet the following criteria:  . Traveled to [place], [place], [place], [place] or [place]; or in the [place] to [place], [place], [place], or [place]; and have fever, cough, and shortness of breath within the last 2 weeks of travel OR . Been in close contact with a person diagnosed with COVID-19 within the last 2 weeks and have fever, cough, and shortness of breath . IF YOU DO NOT MEET THESE CRITERIA, YOU ARE CONSIDERED LOW RISK FOR COVID-19.  What to do if you are HIGH RISK for COVID-19?  . If you are having a medical emergency, call 911. . Seek medical care right away. Before you go to a doctor's office, urgent care or emergency department, call ahead and tell them about your recent travel, contact with someone diagnosed with COVID-19, and your symptoms. You should receive instructions from your physician's office regarding next steps of care.  . When you arrive at healthcare provider, tell the healthcare staff immediately you have returned from visiting [place], [place], [place], [place] or [place]; or traveled in the [place] to [place], [place], [place], or [place]; in the last two weeks or you have been in close contact with a person diagnosed with COVID-19 in the last 2 weeks.   . Tell the health care staff about your symptoms: fever, cough and shortness of breath. . After you have been seen by a medical provider, you will be either: o Tested for (COVID-19) and discharged home on quarantine except to seek medical care if symptoms worsen, and asked to  - Stay home and avoid contact with others until you get your results (4-5 days)  - Avoid travel on public transportation if possible (such as bus, train, or airplane) or o Sent to the Emergency Department by EMS for evaluation, COVID-19 testing, and  possible admission depending on your condition and test results.  What to do if you are LOW RISK for COVID-19?  Reduce your risk of any infection by using the same precautions used for avoiding the common cold or flu:  . Wash your hands often with soap and warm water for at least 20 seconds.  If soap and water are not readily available, use an alcohol-based hand sanitizer with at least 60% alcohol.  . If coughing or sneezing, cover your mouth and nose by coughing or sneezing into the elbow areas of your shirt or coat, into a tissue or into your sleeve (not your hands). . Avoid shaking hands with others and consider head nods or verbal greetings only. . Avoid touching your eyes, nose, or mouth with unwashed hands.  . Avoid close contact with people who are sick. . Avoid places or events with large numbers of people in one location, like concerts or sporting events. . Carefully consider travel plans you have or are making. . If you are planning any travel outside or inside the [place], visit the CDC's Travelers' Health webpage for the latest health notices. . If you have some symptoms but not all   symptoms, continue to monitor at home and seek medical attention if your symptoms worsen. . If you are having a medical emergency, call 911.   ADDITIONAL HEALTHCARE OPTIONS FOR PATIENTS  [facility] / e-Visit: [email or web address]         [facility]: [phone]  [facility]: [phone]                   [facility]: [phone]  

## 2019-07-25 ENCOUNTER — Encounter: Payer: Self-pay | Admitting: Physician Assistant

## 2019-07-25 ENCOUNTER — Other Ambulatory Visit: Payer: Self-pay

## 2019-07-25 ENCOUNTER — Ambulatory Visit (INDEPENDENT_AMBULATORY_CARE_PROVIDER_SITE_OTHER): Payer: 59 | Admitting: Physician Assistant

## 2019-07-25 ENCOUNTER — Ambulatory Visit: Payer: Self-pay

## 2019-07-25 VITALS — BP 134/89 | HR 89 | Resp 15 | Ht 66.0 in | Wt 211.0 lb

## 2019-07-25 DIAGNOSIS — M545 Low back pain, unspecified: Secondary | ICD-10-CM

## 2019-07-25 DIAGNOSIS — Z8669 Personal history of other diseases of the nervous system and sense organs: Secondary | ICD-10-CM | POA: Diagnosis not present

## 2019-07-25 DIAGNOSIS — M7061 Trochanteric bursitis, right hip: Secondary | ICD-10-CM

## 2019-07-25 DIAGNOSIS — Z87891 Personal history of nicotine dependence: Secondary | ICD-10-CM | POA: Diagnosis not present

## 2019-07-25 DIAGNOSIS — M797 Fibromyalgia: Secondary | ICD-10-CM | POA: Diagnosis not present

## 2019-07-25 DIAGNOSIS — G8929 Other chronic pain: Secondary | ICD-10-CM | POA: Diagnosis not present

## 2019-07-25 DIAGNOSIS — F5101 Primary insomnia: Secondary | ICD-10-CM | POA: Diagnosis not present

## 2019-07-25 DIAGNOSIS — D472 Monoclonal gammopathy: Secondary | ICD-10-CM | POA: Diagnosis not present

## 2019-07-25 DIAGNOSIS — Z8709 Personal history of other diseases of the respiratory system: Secondary | ICD-10-CM

## 2019-07-25 DIAGNOSIS — M7062 Trochanteric bursitis, left hip: Secondary | ICD-10-CM | POA: Diagnosis not present

## 2019-07-25 DIAGNOSIS — R5383 Other fatigue: Secondary | ICD-10-CM | POA: Diagnosis not present

## 2019-07-25 DIAGNOSIS — M62838 Other muscle spasm: Secondary | ICD-10-CM | POA: Diagnosis not present

## 2019-07-25 NOTE — Patient Instructions (Signed)

## 2019-08-01 ENCOUNTER — Other Ambulatory Visit: Payer: Self-pay | Admitting: Rheumatology

## 2019-08-01 NOTE — Telephone Encounter (Signed)
Please schedule patient for a follow up visit  Patient due March 2021.

## 2019-08-01 NOTE — Telephone Encounter (Signed)
Last visit: 07/25/19 Next Visit due March 2021. Message sent to the front to schedule patient   Okay to refill per Dr. Estanislado Pandy

## 2019-08-02 NOTE — Telephone Encounter (Signed)
I LMOM for patient to call, and schedule a follow up appt in March 2021 with Hazel Sams, PAC.

## 2019-08-20 ENCOUNTER — Telehealth: Payer: 59 | Admitting: Cardiology

## 2019-08-20 ENCOUNTER — Telehealth: Payer: Self-pay

## 2019-08-20 NOTE — Telephone Encounter (Signed)
Attempted to contact pt x 3 for virtual appointment scheduled with Dr. Harrell Gave today 10/5 at 9 am. Left message for pt to call office to reschedule appointment.

## 2019-08-24 ENCOUNTER — Other Ambulatory Visit: Payer: Self-pay | Admitting: *Deleted

## 2019-08-24 DIAGNOSIS — D500 Iron deficiency anemia secondary to blood loss (chronic): Secondary | ICD-10-CM

## 2019-08-26 NOTE — Progress Notes (Signed)
HEMATOLOGY/ONCOLOGY CONSULTATION NOTE  Date of Service: 08/27/2019  Patient Care Team: Jonathon Jordan, MD as PCP - General (Family Medicine) Buford Dresser, MD as PCP - Cardiology (Cardiology)  CHIEF COMPLAINTS/PURPOSE OF CONSULTATION:  IDA  HISTORY OF PRESENTING ILLNESS:   Teresa Roberts is a wonderful 58 y.o. female who has been referred to [place] by Dr Jonathon Jordan for evaluation and management of IDA. The pt reports that she is doing well overall.  The pt reports than she has been anemic for half of her life. Her periods were not heavy when she was younger. She was not on PO Iron in the past (she is now) and has never required iron infusions or blood transfusions.   The pt has hemorrhoids and sometimes notes blood in her stools. She does not have a GI physician but had a colonoscopy in 2018 that was normal. At this time, she experiences a large amount bleeding due to her hemorrhoids about twice a month. She reports that before she has bowel movements, she experiences a lot of abdominal pain. The pt has never had an endoscopy.   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 She is on 8 Liters of oxygen at baseline. She has been postmenopausal since age 82 and stopped eating meat 10 months ago. She experiences syncopal episodes occasionally, the last episode was 3 months ago. The pt has been on Omeprazole for at least 1-2 years.   Most recent lab results (05/31/2019) of CBC is as follows: all values are WNL except for WBC at 12.8k, RBC at 3.83, HGB at 7.7, HCT at 26.7, MCV at 69.6, MCH at 20.1, MCHC at 29.0, RDW at 29.3. 05/31/2019 Ferritin at 7  On review of systems, pt reports belly pain before bowel movements, occasional syncopal episodes, blood in the stool, craving ice, and denies swallowing problems, belly pain after eating, unexpected weight loss and any other symptoms.   On PMHx the pt reports hx of MGUS, fibromyalgia, hemorrhoids  On Social Hx the pt reports  smoking cigarettes 1/2 pack daily  INTERVAL HISTORY:   Teresa Roberts is a wonderful 58 y.o. female who is here for evaluation and management of IDA. The patient's last visit with [place] was on 06/27/2019. The pt reports that she is doing well overall.  The pt reports that she has been well in the interim and her energy levels have been good. She denies any hemorrhoidal bleeding or other bleeding concerns. She had no issues with the IV Iron. Pt has not had an appointment with a GI. She is no longer taking Omeprazole daily, she has recently been taking them 2-3 times per week. Pt is currently on 5 liters of oxygen.   Lab results today (08/27/19) of CBC w/diff and CMP is as follows: all values are WNL except for RDW at 16.0, Potassium at 2.9, CO2 at 34, Creatinine at 1.04, Calcium at 11.1, AST at 12, Total Bilirubin at <0.2. 08/27/2019 Ferritin at 81 08/27/2019 Iron and TIBC is as follows: Iron at 79, TIBC at 415, Sat Ratios at 19, UIBC at 336   On review of systems, pt denies abdominal pain, leg swelling, fatigue, hemorrhoidal bleeding, other bleeding concerns and any other symptoms.    MEDICAL HISTORY:  Past Medical History:  Diagnosis Date  . CAD (coronary artery disease)    a. 01/2017: cath showing 20% mid RCA stenosis and otherwise normal LAD and LCx with a preserved EF of 55 to 60%; mod  pulmonary HTN  . COPD (chronic obstructive pulmonary disease) ([place])    O2 dependent  . cpap   . Depression    PTSD  . Diastolic CHF ([place])   . Fibromyalgia   . Hyperlipidemia   . Hypertension   . Migraines     SURGICAL HISTORY: Past Surgical History:  Procedure Laterality Date  . APPENDECTOMY    . CESAREAN SECTION    . ORIF ANKLE FRACTURE Left 08/12/2017   Procedure: OPEN REDUCTION INTERNAL FIXATION (ORIF) ANKLE FRACTURE;  Surgeon: Rod Can, MD;  Location: [facility];  Service: Orthopedics;  Laterality: Left;  . RIGHT HEART CATH N/A 07/19/2018   Procedure: RIGHT HEART CATH;  Surgeon: Jolaine Artist, MD;  Location: [facility];  Service: Cardiovascular;  Laterality: N/A;  . RIGHT/LEFT HEART CATH AND CORONARY ANGIOGRAPHY N/A 01/27/2017   Procedure: Right/Left Heart Cath and Coronary Angiography;  Surgeon: Troy Sine, MD;  Location: [facility];  Service: Cardiovascular;  Laterality: N/A;    SOCIAL HISTORY: Social History   Socioeconomic History  . Marital status: Married    Spouse name: Not on file  . Number of children: Not on file  . Years of education: Not on file  . Highest education level: Not on file  Occupational History  . Not on file  Social Needs  . Financial resource strain: Not on file  . Food insecurity    Worry: Not on file    Inability: Not on file  . Transportation needs    Medical: Not on file    Non-medical: Not on file  Tobacco Use  . Smoking status: Former Smoker    Packs/day: 1.00    Years: 39.00    Pack years: 39.00    Types: Cigarettes    Start date: 07/21/2018  . Smokeless tobacco: Never Used  Substance and Sexual Activity  . Alcohol use: No    Frequency: Never  . Drug use: Yes    Types: Marijuana    Comment: occ  . Sexual activity: Not on file  Lifestyle  . Physical activity    Days per week: Not on file    Minutes per session: Not on file  . Stress: Not on file  Relationships  . Social Herbalist on phone: Not on file    Gets together: Not on file    Attends religious service: Not on file    Active member of club or organization: Not on file    Attends meetings of clubs or organizations: Not on file    Relationship status: Not on file  . Intimate partner violence    Fear of current or ex partner: Not on file    Emotionally abused: Not on file    Physically abused: Not on file    Forced sexual activity: Not on file  Other Topics Concern  . Not on file  Social History Narrative  . Not on file    FAMILY HISTORY: Family History  Problem Relation Age of Onset  . Diabetes Mother   .  Hypertension Mother   . CAD Mother   . Diabetes Father   . Lung cancer Father   . Diabetes Sister     ALLERGIES:  is allergic to citrus and lisinopril.  MEDICATIONS:  Current Outpatient Medications  Medication Sig Dispense Refill  . albuterol (PROVENTIL) (2.5 MG/3ML) 0.083% nebulizer solution Take 3 mLs (2.5 mg total) by nebulization every 6 (six) hours as needed for wheezing  or shortness of breath. 75 mL 12  . albuterol (VENTOLIN HFA) 108 (90 Base) MCG/ACT inhaler Inhale 2 puffs into the lungs every 6 (six) hours as needed.    Marland Kitchen amLODipine (NORVASC) 2.5 MG tablet Take 2.5 mg by mouth daily.  0  . aspirin 81 MG EC tablet Take 81 mg by mouth daily after lunch.  0  . busPIRone (BUSPAR) 10 MG tablet Take 10 mg by mouth 2 (two) times daily.    Marland Kitchen docusate sodium (COLACE) 100 MG capsule Take 100 mg by mouth 3 (three) times daily.    . dorzolamide-timolol (COSOPT) 22.3-6.8 MG/ML ophthalmic solution Place 1 drop into both eyes 2 (two) times daily.    . folic acid (FOLVITE) 1 MG tablet Take 1 mg by mouth daily after lunch.  0  . furosemide (LASIX) 40 MG tablet Take 1 tablet (40 mg total) by mouth daily. 30 tablet 11  . gabapentin (NEURONTIN) 800 MG tablet Take 800 mg by mouth 2 (two) times daily.    Marland Kitchen ipratropium-albuterol (DUONEB) 0.5-2.5 (3) MG/3ML SOLN Take 3 mLs by nebulization every 6 (six) hours as needed (Shortness of breath). 360 mL 1  . latanoprost (XALATAN) 0.005 % ophthalmic solution Place 1 drop into both eyes at bedtime.    . metaxalone (SKELAXIN) 800 MG tablet TAKE 1 TABLET (800 MG TOTAL) BY MOUTH 3 (THREE) TIMES DAILY AS NEEDED FOR MUSCLE SPASMS. 90 tablet 0  . modafinil (PROVIGIL) 100 MG tablet Take 100 mg by mouth daily.  4  . Multiple Vitamins-Minerals (HAIR/SKIN/NAILS/BIOTIN) TABS Take 1 tablet by mouth daily after lunch.    Marland Kitchen omeprazole (PRILOSEC) 20 MG capsule Take by mouth daily.  4  . OXYGEN Inhale 5 L into the lungs continuous.     . rizatriptan (MAXALT-MLT) 10 MG  disintegrating tablet Take 10 mg by mouth as needed for migraine. May repeat in 2 hours if needed    . SAXENDA 18 MG/3ML SOPN 0.6 MG SQ ONCE A DAY X1 WEEK, THEN INCREASE DOSE BY 0.6 MG/DAY EVERY WEEK (MAX  1.8 MG/DAY) DAILY    . simvastatin (ZOCOR) 10 MG tablet Take 10 mg by mouth at bedtime.    Marland Kitchen tiZANidine (ZANAFLEX) 4 MG tablet TAKE 1 TABLET (4 MG TOTAL) BY MOUTH AT BEDTIME AS NEEDED FOR MUSCLE SPASMS. 30 tablet 0  . topiramate (TOPAMAX) 25 MG tablet Take 50 mg by mouth daily.     . traZODone (DESYREL) 100 MG tablet Take 100 mg by mouth at bedtime.     . TRELEGY ELLIPTA 100-62.5-25 MCG/INH AEPB INHALE 1 PUFF BY MOUTH EVERY DAY 60 each 2  . vitamin B-12 (CYANOCOBALAMIN) 1000 MCG tablet Take 1,000 mcg by mouth daily after lunch.     No current facility-administered medications for this visit.     REVIEW OF SYSTEMS:    A 10+ POINT REVIEW OF SYSTEMS WAS OBTAINED including neurology, dermatology, psychiatry, cardiac, respiratory, lymph, extremities, GI, GU, Musculoskeletal, constitutional, breasts, reproductive, HEENT.  All pertinent positives are noted in the HPI.  All others are negative.    PHYSICAL EXAMINATION: ECOG PERFORMANCE STATUS: 2 - Symptomatic, <50% confined to bed  . Vitals:   08/27/19 1452  BP: 111/77  Pulse: 90  Resp: 18  Temp: 98 F (36.7 C)  SpO2: 100%   Filed Weights   08/27/19 1452  Weight: 205 lb (93 kg)   .Body mass index is 33.09 kg/m.  GENERAL:alert, in no acute distress and comfortable SKIN: no acute rashes, no significant lesions  EYES: conjunctiva are pink and non-injected, sclera anicteric OROPHARYNX: MMM, no exudates, no oropharyngeal erythema or ulceration NECK: supple, no JVD LYMPH:  no palpable lymphadenopathy in the cervical, axillary or inguinal regions LUNGS: pt is on 5 liters of oxygen at baseline HEART: regular rate & rhythm ABDOMEN:  normoactive bowel sounds , non tender, not distended. No palpable hepatosplenomegaly.  Extremity: no pedal  edema PSYCH: alert & oriented x 3 with fluent speech NEURO: no focal motor/sensory deficits  LABORATORY DATA:  I have reviewed the data as listed  . CBC Latest Ref Rng & Units 08/27/2019 06/27/2019 09/23/2018  WBC 4.0 - 10.5 K/uL 5.2 6.3 8.0  Hemoglobin 12.0 - 15.0 g/dL 13.3 11.0(L) 10.0(L)  Hematocrit 36.0 - 46.0 % 42.4 37.9 38.3  Platelets 150 - 400 K/uL 164 258 247    . CMP Latest Ref Rng & Units 06/27/2019 09/23/2018 07/12/2018  Glucose 70 - 99 mg/dL 96 101(H) 86  BUN 6 - 20 mg/dL 10 9 17   Creatinine 0.44 - 1.00 mg/dL 1.07(H) 0.94 1.24(H)  Sodium 135 - 145 mmol/L 143 142 143  Potassium 3.5 - 5.1 mmol/L 4.0 3.9 4.1  Chloride 98 - 111 mmol/L 102 105 99  CO2 22 - 32 mmol/L 28 30 24   Calcium 8.9 - 10.3 mg/dL 10.7(H) 10.4(H) 10.9(H)  Total Protein 6.5 - 8.1 g/dL 7.6 7.7 -  Total Bilirubin 0.3 - 1.2 mg/dL 0.2(L) 0.5 -  Alkaline Phos 38 - 126 U/L 130(H) 111 -  AST 15 - 41 U/L 16 17 -  ALT 0 - 44 U/L 17 13 -    06/03/2019 LABS   RADIOGRAPHIC STUDIES: I have personally reviewed the radiological images as listed and agreed with the findings in the report. No results found.  ASSESSMENT & PLAN:   #1 IDA- GI bleeding + Poor iron absorption from chronic PPI use. -05/31/2019 Ferritin at 7, RBC at 3.83 -Will schedule the pt for 2 doses of IV Iron today  #2 MGUS -Will order labs today  PLAN: -Discussed pt labwork today, 08/27/19; all values are WNL except for RDW at 16.0, Potassium at 2.9, CO2 at 34, Creatinine at 1.04, Calcium at 11.1, AST at 12, Total Bilirubin at <0.2. -Discussed 08/27/2019 Ferritin at 81 -Discussed 08/27/2019 Iron and TIBC is as follows: Iron at 79, TIBC at 415, Sat Ratios at 19, UIBC at 336  -Re-explained  that the most likely explanation of her iron deficiency is that she is losing iron through GI bleeding/hemorrhoids -Recommend F/U with GI for endoscopy, EGD & stool sampling to check for other explanations of blood loss -Hold PO Iron at this time -May  discharge pt to PCP care depending on labs in 6 months  -Will see back in 6 months   FOLLOW UP: RTC with Dr Irene Limbo with labs in 6 months  No orders of the defined types were placed in this encounter.   The total time spent in the appt was 15 minutes and more than 50% was on counseling and direct patient cares.  All of the patient's questions were answered with apparent satisfaction. The patient knows to call the clinic with any problems, questions or concerns.    Sullivan Lone MD [place] AAHIVMS [facility] Hematology/Oncology Physician [facility]  (Office):       [phone] (Work cell):  [phone] (Fax):           [phone]  08/27/2019 3:08 PM  I, Yevette Edwards, am acting as a scribe for Dr. Sullivan Lone.   Marland Kitchen  I have reviewed the above documentation for accuracy and completeness, and I agree with the above. Brunetta Genera MD

## 2019-08-27 ENCOUNTER — Telehealth: Payer: Self-pay | Admitting: *Deleted

## 2019-08-27 ENCOUNTER — Telehealth: Payer: Self-pay | Admitting: Hematology

## 2019-08-27 ENCOUNTER — Inpatient Hospital Stay: Payer: 59 | Attending: Hematology

## 2019-08-27 ENCOUNTER — Other Ambulatory Visit: Payer: Self-pay | Admitting: Rheumatology

## 2019-08-27 ENCOUNTER — Inpatient Hospital Stay (HOSPITAL_BASED_OUTPATIENT_CLINIC_OR_DEPARTMENT_OTHER): Payer: 59 | Admitting: Hematology

## 2019-08-27 ENCOUNTER — Other Ambulatory Visit: Payer: Self-pay

## 2019-08-27 VITALS — BP 111/77 | HR 90 | Temp 98.0°F | Resp 18 | Ht 66.0 in | Wt 205.0 lb

## 2019-08-27 DIAGNOSIS — D649 Anemia, unspecified: Secondary | ICD-10-CM | POA: Diagnosis not present

## 2019-08-27 DIAGNOSIS — Z833 Family history of diabetes mellitus: Secondary | ICD-10-CM | POA: Insufficient documentation

## 2019-08-27 DIAGNOSIS — D500 Iron deficiency anemia secondary to blood loss (chronic): Secondary | ICD-10-CM | POA: Diagnosis not present

## 2019-08-27 DIAGNOSIS — F17210 Nicotine dependence, cigarettes, uncomplicated: Secondary | ICD-10-CM | POA: Insufficient documentation

## 2019-08-27 DIAGNOSIS — J449 Chronic obstructive pulmonary disease, unspecified: Secondary | ICD-10-CM | POA: Insufficient documentation

## 2019-08-27 DIAGNOSIS — K921 Melena: Secondary | ICD-10-CM | POA: Insufficient documentation

## 2019-08-27 DIAGNOSIS — R55 Syncope and collapse: Secondary | ICD-10-CM | POA: Insufficient documentation

## 2019-08-27 DIAGNOSIS — K922 Gastrointestinal hemorrhage, unspecified: Secondary | ICD-10-CM | POA: Insufficient documentation

## 2019-08-27 DIAGNOSIS — M797 Fibromyalgia: Secondary | ICD-10-CM | POA: Diagnosis not present

## 2019-08-27 DIAGNOSIS — Z8249 Family history of ischemic heart disease and other diseases of the circulatory system: Secondary | ICD-10-CM | POA: Diagnosis not present

## 2019-08-27 DIAGNOSIS — Z79899 Other long term (current) drug therapy: Secondary | ICD-10-CM | POA: Diagnosis not present

## 2019-08-27 DIAGNOSIS — Z6833 Body mass index (BMI) 33.0-33.9, adult: Secondary | ICD-10-CM | POA: Insufficient documentation

## 2019-08-27 DIAGNOSIS — D472 Monoclonal gammopathy: Secondary | ICD-10-CM | POA: Insufficient documentation

## 2019-08-27 DIAGNOSIS — K909 Intestinal malabsorption, unspecified: Secondary | ICD-10-CM | POA: Diagnosis not present

## 2019-08-27 DIAGNOSIS — K649 Unspecified hemorrhoids: Secondary | ICD-10-CM | POA: Diagnosis not present

## 2019-08-27 DIAGNOSIS — Z801 Family history of malignant neoplasm of trachea, bronchus and lung: Secondary | ICD-10-CM | POA: Diagnosis not present

## 2019-08-27 DIAGNOSIS — R109 Unspecified abdominal pain: Secondary | ICD-10-CM | POA: Diagnosis not present

## 2019-08-27 LAB — CBC WITH DIFFERENTIAL (CANCER CENTER ONLY)
Abs Immature Granulocytes: 0.01 10*3/uL (ref 0.00–0.07)
Basophils Absolute: 0 10*3/uL (ref 0.0–0.1)
Basophils Relative: 0 %
Eosinophils Absolute: 0.1 10*3/uL (ref 0.0–0.5)
Eosinophils Relative: 2 %
HCT: 42.4 % (ref 36.0–46.0)
Hemoglobin: 13.3 g/dL (ref 12.0–15.0)
Immature Granulocytes: 0 %
Lymphocytes Relative: 22 %
Lymphs Abs: 1.2 10*3/uL (ref 0.7–4.0)
MCH: 27.6 pg (ref 26.0–34.0)
MCHC: 31.4 g/dL (ref 30.0–36.0)
MCV: 88 fL (ref 80.0–100.0)
Monocytes Absolute: 0.4 10*3/uL (ref 0.1–1.0)
Monocytes Relative: 7 %
Neutro Abs: 3.6 10*3/uL (ref 1.7–7.7)
Neutrophils Relative %: 69 %
Platelet Count: 164 10*3/uL (ref 150–400)
RBC: 4.82 MIL/uL (ref 3.87–5.11)
RDW: 16 % — ABNORMAL HIGH (ref 11.5–15.5)
WBC Count: 5.2 10*3/uL (ref 4.0–10.5)
nRBC: 0 % (ref 0.0–0.2)

## 2019-08-27 LAB — CMP (CANCER CENTER ONLY)
ALT: 13 U/L (ref 0–44)
AST: 12 U/L — ABNORMAL LOW (ref 15–41)
Albumin: 3.9 g/dL (ref 3.5–5.0)
Alkaline Phosphatase: 112 U/L (ref 38–126)
Anion gap: 8 (ref 5–15)
BUN: 12 mg/dL (ref 6–20)
CO2: 34 mmol/L — ABNORMAL HIGH (ref 22–32)
Calcium: 11.1 mg/dL — ABNORMAL HIGH (ref 8.9–10.3)
Chloride: 100 mmol/L (ref 98–111)
Creatinine: 1.04 mg/dL — ABNORMAL HIGH (ref 0.44–1.00)
GFR, Est AFR Am: >60 mL/min (ref >60)
GFR, Estimated: 60 mL/min — ABNORMAL LOW (ref >60)
Glucose, Bld: 97 mg/dL (ref 70–99)
Potassium: 2.9 mmol/L — CL (ref 3.5–5.1)
Sodium: 142 mmol/L (ref 135–145)
Total Bilirubin: <0.2 mg/dL — ABNORMAL LOW (ref 0.3–1.2)
Total Protein: 7.5 g/dL (ref 6.5–8.1)

## 2019-08-27 LAB — IRON AND TIBC
Iron: 79 ug/dL (ref 41–142)
Saturation Ratios: 19 % — ABNORMAL LOW (ref 21–57)
TIBC: 415 ug/dL (ref 236–444)
UIBC: 336 ug/dL (ref 120–384)

## 2019-08-27 LAB — FERRITIN: Ferritin: 81 ng/mL (ref 11–307)

## 2019-08-27 MED ORDER — POTASSIUM CHLORIDE CRYS ER 20 MEQ PO TBCR: EXTENDED_RELEASE_TABLET | ORAL | 0 refills | Status: DC

## 2019-08-27 NOTE — Telephone Encounter (Signed)
CRITICAL VALUE STICKER  CRITICAL VALUE: K+ = 2.9.  RECEIVER (on-site recipient of call): Mining engineer, Triage [place].   DATE & TIME NOTIFIED: 08/27/2019 at 1517.   MESSENGER (representative from lab): Corbin [facility].  MD NOTIFIED: Collaborative  nurse.  TIME OF NOTIFICATION: 08/27/2019 at 1523.  RESPONSE: None.

## 2019-08-27 NOTE — Telephone Encounter (Signed)
-----   Message from Brunetta Genera, MD sent at 08/27/2019  3:43 PM EDT ----- Hi Teresa Roberts, Plz let patient know her potassium levels are very low down to 2.9-- likely from her lasix. I have ordered PO potassium - 1 week prescription for replacement but she does need to continue mx of this with her PCP and have labs rpt with PCP in 7-10 days. Her calcium levels are also elevated and need to followed up with her PCP. Thanks [place]

## 2019-08-27 NOTE — Telephone Encounter (Signed)
Spoke with pt and informed pt of low potassium level as per Dr. Grier Mitts instructions.  Instructed pt to start potassium replacement today as prescribed by Dr. Irene Limbo.  Instructed pt to follow up with PCP in 7 - 10 days for lab rechecked, and to follow up with increased calcium levels.  Pt voiced understanding.

## 2019-08-27 NOTE — Telephone Encounter (Signed)
Scheduled appt per 10/12 los - gave patient AVS

## 2019-08-28 NOTE — Telephone Encounter (Signed)
Last visit: 07/25/19 Next Visit due March 2021. Message sent to the front to schedule patient   Okay to refill Skelaxin?

## 2019-08-28 NOTE — Telephone Encounter (Signed)
ok 

## 2019-08-29 ENCOUNTER — Other Ambulatory Visit: Payer: Self-pay | Admitting: Rheumatology

## 2019-08-29 NOTE — Telephone Encounter (Signed)
Last visit: 07/25/19 Next Visit due March 2021. Message sent to the front to schedule patient   Okay to refill per Dr. Estanislado Pandy

## 2019-08-29 NOTE — Telephone Encounter (Signed)
Please schedule patient for a follow up visit. Patient due March 2021. Thanks!  

## 2019-09-02 ENCOUNTER — Other Ambulatory Visit: Payer: Self-pay | Admitting: Rheumatology

## 2019-09-03 NOTE — Telephone Encounter (Signed)
Last visit: 07/25/19 Next Visit: 01/29/20  Okay to refill per Dr. Estanislado Pandy

## 2019-09-19 ENCOUNTER — Other Ambulatory Visit: Payer: Self-pay

## 2019-09-19 ENCOUNTER — Encounter: Payer: Self-pay | Admitting: Cardiology

## 2019-09-19 ENCOUNTER — Ambulatory Visit (INDEPENDENT_AMBULATORY_CARE_PROVIDER_SITE_OTHER): Payer: 59 | Admitting: Cardiology

## 2019-09-19 VITALS — BP 114/74 | HR 85 | Temp 97.5°F | Ht 66.0 in | Wt 197.8 lb

## 2019-09-19 DIAGNOSIS — I5032 Chronic diastolic (congestive) heart failure: Secondary | ICD-10-CM

## 2019-09-19 DIAGNOSIS — I272 Pulmonary hypertension, unspecified: Secondary | ICD-10-CM

## 2019-09-19 DIAGNOSIS — R0789 Other chest pain: Secondary | ICD-10-CM | POA: Diagnosis not present

## 2019-09-19 DIAGNOSIS — Z720 Tobacco use: Secondary | ICD-10-CM

## 2019-09-19 DIAGNOSIS — I10 Essential (primary) hypertension: Secondary | ICD-10-CM | POA: Diagnosis not present

## 2019-09-19 DIAGNOSIS — I2510 Atherosclerotic heart disease of native coronary artery without angina pectoris: Secondary | ICD-10-CM

## 2019-09-19 DIAGNOSIS — F17210 Nicotine dependence, cigarettes, uncomplicated: Secondary | ICD-10-CM

## 2019-09-19 DIAGNOSIS — Z7189 Other specified counseling: Secondary | ICD-10-CM

## 2019-09-19 NOTE — Progress Notes (Signed)
Cardiology Office Note:    Date:  09/19/2019   ID:  Teresa Roberts, DOB 1961/02/23, MRN [id number]  PCP:  Jonathon Jordan, MD  Cardiologist:  Buford Dresser, MD PhD  Referring MD: Jonathon Jordan, MD   CC: Shortness of breath, follow up  History of Present Illness:    Teresa Roberts is a 58 y.o. female with a hx of fibromyalgia, hypertension, COPD, chronic diastolic heart failure, nononbstructive CAD who is seen in follow up at the request of Dr. Stephanie Acre for evaluation and management of shortness of breath. She was initially seen on 07/11/18.   Today: Overall she feels things are stable. Gets daily left rib sharp pain, lasts 30 seconds, goes away on its own. No associated symptoms. No radiation. Non exertional.   Has been following closely by her specialists. Has received IV iron for her anemia.   No changes to family history. She is smoking 1/2 ppd. Discussed cessation today. Has worked on diet, has lost 26 lbs with saxenda.   Taking triptan 1x/mos. Discussed risk of Mi with known CAD (though hers is nonobstructive CAD).  Doesn't check BP at home. No issues with fluid or swelling.  Denies PND, orthopnea, LE edema or unexpected weight gain. No syncope or palpitations.  Past Medical History:  Diagnosis Date  . CAD (coronary artery disease)    a. 01/2017: cath showing 20% mid RCA stenosis and otherwise normal LAD and LCx with a preserved EF of 55 to 60%; mod pulmonary HTN  . COPD (chronic obstructive pulmonary disease) ([place])    O2 dependent  . cpap   . Depression    PTSD  . Diastolic CHF ([place])   . Fibromyalgia   . Hyperlipidemia   . Hypertension   . Migraines     Past Surgical History:  Procedure Laterality Date  . APPENDECTOMY    . CESAREAN SECTION    . ORIF ANKLE FRACTURE Left 08/12/2017   Procedure: OPEN REDUCTION INTERNAL FIXATION (ORIF) ANKLE FRACTURE;  Surgeon: Rod Can, MD;  Location: [facility];  Service: Orthopedics;  Laterality: Left;  . RIGHT HEART CATH N/A  07/19/2018   Procedure: RIGHT HEART CATH;  Surgeon: Jolaine Artist, MD;  Location: [facility];  Service: Cardiovascular;  Laterality: N/A;  . RIGHT/LEFT HEART CATH AND CORONARY ANGIOGRAPHY N/A 01/27/2017   Procedure: Right/Left Heart Cath and Coronary Angiography;  Surgeon: Troy Sine, MD;  Location: [facility];  Service: Cardiovascular;  Laterality: N/A;    Current Medications: Current Outpatient Medications on File Prior to Visit  Medication Sig  . albuterol (PROVENTIL) (2.5 MG/3ML) 0.083% nebulizer solution Take 3 mLs (2.5 mg total) by nebulization every 6 (six) hours as needed for wheezing or shortness of breath.  Marland Kitchen albuterol (VENTOLIN HFA) 108 (90 Base) MCG/ACT inhaler Inhale 2 puffs into the lungs every 6 (six) hours as needed.  Marland Kitchen amLODipine (NORVASC) 2.5 MG tablet Take 2.5 mg by mouth daily.  Marland Kitchen aspirin 81 MG EC tablet Take 81 mg by mouth daily after lunch.  . busPIRone (BUSPAR) 10 MG tablet Take 10 mg by mouth 2 (two) times daily.  Marland Kitchen docusate sodium (COLACE) 100 MG capsule Take 100 mg by mouth 3 (three) times daily.  . dorzolamide-timolol (COSOPT) 22.3-6.8 MG/ML ophthalmic solution Place 1 drop into both eyes 2 (two) times daily.  . DULoxetine (CYMBALTA) 30 MG capsule TAKE 1 CAPSULE BY MOUTH EVERYDAY AT BEDTIME  . folic acid (FOLVITE) 1 MG tablet Take 1 mg by mouth daily after lunch.  Marland Kitchen  gabapentin (NEURONTIN) 800 MG tablet Take 800 mg by mouth 2 (two) times daily.  Marland Kitchen ipratropium-albuterol (DUONEB) 0.5-2.5 (3) MG/3ML SOLN Take 3 mLs by nebulization every 6 (six) hours as needed (Shortness of breath).  . latanoprost (XALATAN) 0.005 % ophthalmic solution Place 1 drop into both eyes at bedtime.  . metaxalone (SKELAXIN) 800 MG tablet TAKE 1 TABLET (800 MG TOTAL) BY MOUTH 3 (THREE) TIMES DAILY AS NEEDED FOR MUSCLE SPASMS.  . modafinil (PROVIGIL) 100 MG tablet Take 100 mg by mouth daily.  . Multiple Vitamins-Minerals (HAIR/SKIN/NAILS/BIOTIN) TABS Take 1 tablet by mouth  daily after lunch.  Marland Kitchen omeprazole (PRILOSEC) 20 MG capsule Take by mouth daily.  . OXYGEN Inhale 5 L into the lungs continuous.   . potassium chloride SA (KLOR-CON) 20 MEQ tablet 40 meq po BID x 3 days then 78meq po daily. F/u with PCP In 1 week for rpt labs and continued management of hypokalemia  . rizatriptan (MAXALT-MLT) 10 MG disintegrating tablet Take 10 mg by mouth as needed for migraine. May repeat in 2 hours if needed  . SAXENDA 18 MG/3ML SOPN 0.6 MG SQ ONCE A DAY X1 WEEK, THEN INCREASE DOSE BY 0.6 MG/DAY EVERY WEEK (MAX  1.8 MG/DAY) DAILY  . simvastatin (ZOCOR) 10 MG tablet Take 10 mg by mouth at bedtime.  Marland Kitchen tiZANidine (ZANAFLEX) 4 MG tablet TAKE 1 TABLET (4 MG TOTAL) BY MOUTH AT BEDTIME AS NEEDED FOR MUSCLE SPASMS.  Marland Kitchen topiramate (TOPAMAX) 25 MG tablet Take 50 mg by mouth daily.   . traZODone (DESYREL) 100 MG tablet Take 100 mg by mouth at bedtime.   . TRELEGY ELLIPTA 100-62.5-25 MCG/INH AEPB INHALE 1 PUFF BY MOUTH EVERY DAY  . vitamin B-12 (CYANOCOBALAMIN) 1000 MCG tablet Take 1,000 mcg by mouth daily after lunch.  . furosemide (LASIX) 40 MG tablet Take 1 tablet (40 mg total) by mouth daily.   No current facility-administered medications on file prior to visit.      Allergies:   Citrus and Lisinopril   Social History   Tobacco Use  . Smoking status: Current Every Day Smoker    Packs/day: 0.50    Years: 39.00    Pack years: 19.50    Types: Cigarettes    Start date: 07/21/2018  . Smokeless tobacco: Never Used  Substance Use Topics  . Alcohol use: No    Frequency: Never  . Drug use: Yes    Types: Marijuana    Comment: occ    Family History: The patient's family history includes CAD in her mother; Diabetes in her father, mother, and sister; Hypertension in her mother; Lung cancer in her father. No family history of use of oxygen, no heart failure.  ROS:   Please see the history of present illness.  Additional pertinent ROS: Constitutional: Negative for chills, fever,  night sweats, unintentional weight loss  HENT: Negative for ear pain and hearing loss.   Eyes: Negative for loss of vision and eye pain.  Respiratory: Chronic shortness of breath, cough, O2 use Cardiovascular: See HPI. Gastrointestinal: Negative for abdominal pain, melena, and hematochezia.  Genitourinary: Negative for dysuria and hematuria.  Musculoskeletal: Negative for falls and myalgias.  Skin: Negative for itching and rash.  Neurological: Negative for focal weakness, focal sensory changes and loss of consciousness.  Endo/Heme/Allergies: Does bruise/bleed easily.   EKGs/Labs/Other Studies Reviewed:    The following studies were reviewed today: [place] 07/28/2018 Findings: RA = 13 RV = 52/14 PA = 50/19 (33) PCW = 16 Fick cardiac output/index =  4.7/2.3 PVR = 3.6 WU Ao sat = 97% PA sat = 64%, 64% SVC sat = 72%   Assessment: 1. Mild PAH with mild RV strain. Left-sided pressures and cardiac output look good 2. No evidence of intracardiac shunting  Plan/Discussion: Would continue with conservative measures including CPAP and weight loss. If pressures worsening can consider selective pulmonary artery vasodilator down the road.   R/LHC 01/27/17  Mid RCA lesion, 20 %stenosed.  The left ventricular ejection fraction is 55-65% by visual estimate.  The left ventricular systolic function is normal.  LV end diastolic pressure is normal.   Moderate right sided heart pressure elevation with moderate pulmonary hypertension. Normal systolic function with moderate left ventricular hypertrophy with EF estimate of approximately 60%. 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  RECOMMENDATION: Medical therapy  Right Heart Pressures RA: A wave 15, V-wave 13, mean 12 RV: 55/14 PA: 55/19 PW: mean16  AO: 134/72 PA: 55/19; mean 33  LV: 131/18 PW: a 19; v 17; mean 16  LV: 114/17 AO: 114/66; mean 84  Oxygen  saturation 92% in the aorta and 68% in the pulmonary artery.  Cardiac output by the thermodilution method 6.5 and by the Fick method 6.2 L/m. Cardiac index 3.6 and 3.3 L/m/m, respectively   Echo 05/28/18 Study Conclusions  - Left ventricle: The cavity size was normal. Wall thickness was   increased in a pattern of mild LVH. Systolic function was normal.   The estimated ejection fraction was in the range of 50% to 55%.   Wall motion was normal; there were no regional wall motion   abnormalities. Indeterminate diastolic function. - Aortic valve: Mildly calcified annulus. Trileaflet. - Mitral valve: Mildly calcified annulus. There was trivial   regurgitation. - Left atrium: The atrium was at the upper limits of normal in   size. - Right ventricle: The cavity size was moderately dilated. Systolic   function was severely reduced. - Right atrium: The atrium was mildly dilated. Central venous   pressure (est): 3 mm Hg. - Tricuspid valve: There was mild regurgitation. - Pulmonary arteries: PA peak pressure: 25 mm Hg (S). - Pericardium, extracardiac: A small pericardial effusion was   identified posterior to the heart.  EKG:  The ekg ordered today demonstrates normal sinus rhythm with nonspecific ST changes, similar to prior  Recent Labs: 08/27/2019: ALT 13; BUN 12; Creatinine 1.04; Hemoglobin 13.3; Platelet Count 164; Potassium 2.9; Sodium 142  Recent Lipid Panel    Component Value Date/Time   CHOL 144 05/28/2018 0629   TRIG 147 05/28/2018 0629   HDL 31 (L) 05/28/2018 0629   CHOLHDL 4.6 05/28/2018 0629   VLDL 29 05/28/2018 0629   LDLCALC 84 05/28/2018 0629    Physical Exam:    VS:  BP 114/74   Pulse 85   Temp (!) 97.5 F (36.4 C)   Ht 5\' 6"  (1.676 m)   Wt 197 lb 12.8 oz (89.7 kg)   LMP 08/19/2012   SpO2 90%   BMI 31.93 kg/m     Wt Readings from Last 3 Encounters:  09/19/19 197 lb 12.8 oz (89.7 kg)  08/27/19 205 lb (93 kg)  07/25/19 211 lb (95.7 kg)    GEN: Well  nourished, well developed in no acute distress. Nasal cannula in place.  HEENT: Normal, moist mucous membranes NECK: JVD at clavicle sitting upright CARDIAC: regular rhythm, normal S1 and S2, no rubs or gallops. No murmur. VASCULAR:  Radial and DP pulses 2+ bilaterally. No carotid bruits RESPIRATORY:  Distant breath sounds, no wheezing ABDOMEN: Soft, non-tender, non-distended MUSCULOSKELETAL:  Ambulates independently SKIN: Warm and dry, trace bilateral LE edema NEUROLOGIC:  Alert and oriented x 3. No focal neuro deficits noted. PSYCHIATRIC:  Normal affect   ASSESSMENT:    1. Chronic diastolic (congestive) heart failure ([place])   2. Tobacco abuse   3. Pulmonary hypertension ([place])   4. Essential hypertension   5. Nonocclusive coronary atherosclerosis of native coronary artery   6. Cardiac risk counseling   7. Counseling on health promotion and disease prevention   8. Other chest pain    PLAN:    Chronic diastolic heart failure, Mild Pulmonary hypertension in the setting of COPD and chronic hypoxic respiratory failure requiring home O2: -appears near euvolemic today -RHC consistent with mild-moderate pulmonary hypertension driven by her 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  -continue management of her underlying lung disease -stable on dosing of furosemide, euvolemic today -on chronic home O2 -on daily lasix, continue  Chest pain: atypical, low risk features. Nonobstructive cad on cath in 2018 -no further workup at this time -instructed on red flag symptoms that need immediate medical attention  Hypertension: well controlled today, below goal of <130/80 -continue amlodipine 2.5 mg daily  Nonobstructive asymptomatic CAD: -continue aspirin 81 mg -on simvastatin 10 mg. Would not increase either simvastatin or amlodipine above FDA guidelines, consider alternative therapy if changes needed -discussed data re: triptans and CAD/MI risk. She understands, wishes to continue current therapy  Tobacco  abuse, with cessation counseling: The patient was counseled on tobacco cessation today for 4 minutes.  Counseling included reviewing the risks of smoking tobacco products, how it impacts the patient's current medical diagnoses and different strategies for quitting.  Pharmacotherapy to aid in tobacco cessation was not prescribed today.  CV risk counseling and primary prevention: -recommend heart healthy/Mediterranean diet, with whole grains, fruits, vegetable, fish, lean meats, nuts, and olive oil. Limit salt. -recommend moderate walking, 3-5 times/week for 30-50 minutes each session. Aim for at least 150 minutes.week. Goal should be pace of 3 miles/hours, or walking 1.5 miles in 30 minutes -recommend avoidance of tobacco products. Avoid excess alcohol.  Not addressed in depth today: OSA: uses CPAP COPD: followed by Dr. Elsworth Soho and the pulmonary clinic  Plan for follow up: 6 mos or sooner PRN  Medication Adjustments/Labs and Tests Ordered: Current medicines are reviewed at length with the patient today.  Concerns regarding medicines are outlined above.  Orders Placed This Encounter  Procedures  . EKG 12-Lead   No orders of the defined types were placed in this encounter.   Patient Instructions  Medication Instructions:  Your Physician recommend you continue on your current medication as directed.    *If you need a refill on your cardiac medications before your next appointment, please call your pharmacy*  Lab Work: None  Testing/Procedures: None  Follow-Up: At [facility], you and your health needs are our priority.  As part of our continuing mission to provide you with exceptional heart care, we have created designated Provider Care Teams.  These Care Teams include your primary Cardiologist (physician) and Advanced Practice Providers (APPs -  Physician Assistants and Nurse Practitioners) who all work together to provide you with the care you need, when you need it.  Your next  appointment:   6 months  The format for your next appointment:   In Person  Provider:   Buford Dresser, MD  Signed, Buford Dresser,  MD PhD 09/19/2019 4:11 PM    [facility]

## 2019-09-19 NOTE — Patient Instructions (Signed)

## 2019-09-21 ENCOUNTER — Ambulatory Visit: Payer: 59 | Admitting: Adult Health

## 2019-09-24 ENCOUNTER — Ambulatory Visit (INDEPENDENT_AMBULATORY_CARE_PROVIDER_SITE_OTHER): Payer: 59 | Admitting: Adult Health

## 2019-09-24 ENCOUNTER — Other Ambulatory Visit: Payer: Self-pay

## 2019-09-24 ENCOUNTER — Encounter: Payer: Self-pay | Admitting: Adult Health

## 2019-09-24 DIAGNOSIS — I5032 Chronic diastolic (congestive) heart failure: Secondary | ICD-10-CM | POA: Diagnosis not present

## 2019-09-24 DIAGNOSIS — Z720 Tobacco use: Secondary | ICD-10-CM | POA: Diagnosis not present

## 2019-09-24 DIAGNOSIS — J9611 Chronic respiratory failure with hypoxia: Secondary | ICD-10-CM

## 2019-09-24 DIAGNOSIS — I272 Pulmonary hypertension, unspecified: Secondary | ICD-10-CM | POA: Diagnosis not present

## 2019-09-24 DIAGNOSIS — J432 Centrilobular emphysema: Secondary | ICD-10-CM

## 2019-09-24 MED ORDER — TRELEGY ELLIPTA 100-62.5-25 MCG/INH IN AEPB: 1.0000 | INHALATION_SPRAY | Freq: Every day | RESPIRATORY_TRACT | 5 refills | Status: DC

## 2019-09-24 MED ORDER — ALBUTEROL SULFATE (2.5 MG/3ML) 0.083% IN NEBU: 2.5000 mg | INHALATION_SOLUTION | Freq: Four times a day (QID) | RESPIRATORY_TRACT | 5 refills | Status: DC | PRN

## 2019-09-24 MED ORDER — ALBUTEROL SULFATE HFA 108 (90 BASE) MCG/ACT IN AERS: 2.0000 | INHALATION_SPRAY | Freq: Four times a day (QID) | RESPIRATORY_TRACT | 5 refills | Status: DC | PRN

## 2019-09-24 NOTE — Patient Instructions (Addendum)
Continue on Trelegy daily, rinse after use Continue on oxygen 6 L Continue on CPAP at bedtime Work on not smoking.  Please remember not to smoke inside your home or around any oxygen devices Great job with your weight loss keep up the good work Activity as tolerated Follow-up with Dr. Elsworth Soho in 4 months and as needed

## 2019-09-24 NOTE — Assessment & Plan Note (Signed)
Smoking cessation discussed 

## 2019-09-24 NOTE — Assessment & Plan Note (Addendum)
Moderate to severe COPD-currently well controlled on present regimen.  Have encouraged her greatly about smoking cessation and the dangers of smoking around oxygen.   Plan  Patient Instructions  Continue on Trelegy daily, rinse after use Continue on oxygen 6 L Continue on CPAP at bedtime Work on not smoking.  Please remember not to smoke inside your home or around any oxygen devices Great job with your weight loss keep up the good work Activity as tolerated Follow-up with Dr. Elsworth Soho in 4 months and as needed

## 2019-09-24 NOTE — Assessment & Plan Note (Signed)
O2 saturation adequate on oxygen.  Continue on 6 L.  O2 saturation goal greater than 88 to 90%.

## 2019-09-24 NOTE — Addendum Note (Signed)
Addended by: Len Blalock on: 09/24/2019 03:06 PM   Modules accepted: Orders

## 2019-09-24 NOTE — Assessment & Plan Note (Signed)
Probably multifactorial with underlying obstructive sleep apnea and chronic lung disease.  Continue on oxygen, Lasix.  Continue follow-up with cardiology

## 2019-09-24 NOTE — Assessment & Plan Note (Signed)
Appears compensated without evidence of volume overload on exam.  Continue follow-up with cardiology. 

## 2019-09-24 NOTE — Progress Notes (Signed)
@Patient  ID: Teresa Roberts, female    DOB: 02-08-1961, 58 y.o.   MRN: [id number]  Chief Complaint  Patient presents with  . Follow-up    emphysema    Referring provider: Jonathon Jordan, MD  HPI: 58 year old female active  followed for COPD and pulmonary hypertension, chronic oxygen dependent respiratory failure, obstructive sleep apnea  TEST/EVENTS :  CT angiogram3/2018neg forpulmonary embolism .  ABG showed mild hypercarbia with 7.36/45/62 and 7.33/51/70 . LHCshowed no evidence of coronary artery disease and normal LV function.  RHCshowed RVpressures of 55/14 and LVEDP was 18. PVR not   Echo 05/2018 RVSP 25  Cath 07/2018  PA 50/19, PVR 3.6 WU, RA13  Spirometry4/2018ratio of 69, FEV1 of 1.15-52% and FVC of 60%  NPSG ( [facility]) which per report showed AHI of 16/hour >> auto CPAP 12-20 cm>> avg 16 cm  09/24/2019 Follow up : COPD , O2 RF , Pulmonary HTN  Patient presents for a 1-month follow-up.  Patient has underlying moderate COPD that is oxygen dependent.  She continues to smoke.  Smoking about half a pack of cigarettes a day.  We discussed in detail smoking cessation.  Patient is smoking in her home.  We discussed the dangers of smoking and oxygen use. He remains on Trelegy daily.  She is also on oxygen 6 L.  Says that overall her breathing has actually improved over the last few months.  Feels that she is breathing better with decreased shortness of breath.  She has lost 26 pounds.  Is working on healthy weight loss.  Feels that this is helped with her breathing a lot. Patient has underlying sleep apnea.  Is on nocturnal CPAP.  Patient says she wears her CPAP most nights.  She says she feels pretty rested.  She denies any increased cough shortness of breath.  Says her activity level has actually improved she feels that she has been able to be more active and does not wear out of breath as much.  She does wear her oxygen all the time.  Allergies  Allergen Reactions  .  Citrus Hives  . Lisinopril Cough    Immunization History  Administered Date(s) Administered  . Influenza Split 08/15/2017, 08/24/2019  . Influenza,inj,Quad PF,6+ Mos 09/04/2018  . Influenza-Unspecified 08/15/2016  . Pneumococcal-Unspecified 02/13/2013    Past Medical History:  Diagnosis Date  . CAD (coronary artery disease)    a. 01/2017: cath showing 20% mid RCA stenosis and otherwise normal LAD and LCx with a preserved EF of 55 to 60%; mod pulmonary HTN  . COPD (chronic obstructive pulmonary disease) ([place])    O2 dependent  . cpap   . Depression    PTSD  . Diastolic CHF ([place])   . Fibromyalgia   . Hyperlipidemia   . Hypertension   . Migraines     Tobacco History: Social History   Tobacco Use  Smoking Status Current Every Day Smoker  . Packs/day: 0.50  . Years: 39.00  . Pack years: 19.50  . Types: Cigarettes  . Start date: 07/21/2018  Smokeless Tobacco Never Used   Ready to quit: Not Answered Counseling given: Not Answered   Outpatient Medications Prior to Visit  Medication Sig Dispense Refill  . albuterol (PROVENTIL) (2.5 MG/3ML) 0.083% nebulizer solution Take 3 mLs (2.5 mg total) by nebulization every 6 (six) hours as needed for wheezing or shortness of breath. 75 mL 12  . albuterol (VENTOLIN HFA) 108 (90 Base) MCG/ACT inhaler Inhale 2 puffs into the lungs every 6 (six)  hours as needed.    Marland Kitchen amLODipine (NORVASC) 2.5 MG tablet Take 2.5 mg by mouth daily.  0  . aspirin 81 MG EC tablet Take 81 mg by mouth daily after lunch.  0  . busPIRone (BUSPAR) 10 MG tablet Take 10 mg by mouth 2 (two) times daily.    Marland Kitchen docusate sodium (COLACE) 100 MG capsule Take 100 mg by mouth 3 (three) times daily.    . dorzolamide-timolol (COSOPT) 22.3-6.8 MG/ML ophthalmic solution Place 1 drop into both eyes 2 (two) times daily.    . DULoxetine (CYMBALTA) 30 MG capsule TAKE 1 CAPSULE BY MOUTH EVERYDAY AT BEDTIME 30 capsule 2  . folic acid (FOLVITE) 1 MG tablet Take 1 mg by mouth daily after  lunch.  0  . gabapentin (NEURONTIN) 800 MG tablet Take 800 mg by mouth 2 (two) times daily.    Marland Kitchen ipratropium-albuterol (DUONEB) 0.5-2.5 (3) MG/3ML SOLN Take 3 mLs by nebulization every 6 (six) hours as needed (Shortness of breath). 360 mL 1  . latanoprost (XALATAN) 0.005 % ophthalmic solution Place 1 drop into both eyes at bedtime.    . metaxalone (SKELAXIN) 800 MG tablet TAKE 1 TABLET (800 MG TOTAL) BY MOUTH 3 (THREE) TIMES DAILY AS NEEDED FOR MUSCLE SPASMS. 90 tablet 0  . modafinil (PROVIGIL) 100 MG tablet Take 100 mg by mouth daily.  4  . Multiple Vitamins-Minerals (HAIR/SKIN/NAILS/BIOTIN) TABS Take 1 tablet by mouth daily after lunch.    Marland Kitchen omeprazole (PRILOSEC) 20 MG capsule Take by mouth daily.  4  . OXYGEN Inhale 5 L into the lungs continuous.     . potassium chloride SA (KLOR-CON) 20 MEQ tablet 40 meq po BID x 3 days then 60meq po daily. F/u with PCP In 1 week for rpt labs and continued management of hypokalemia 30 tablet 0  . rizatriptan (MAXALT-MLT) 10 MG disintegrating tablet Take 10 mg by mouth as needed for migraine. May repeat in 2 hours if needed    . SAXENDA 18 MG/3ML SOPN 0.6 MG SQ ONCE A DAY X1 WEEK, THEN INCREASE DOSE BY 0.6 MG/DAY EVERY WEEK (MAX  1.8 MG/DAY) DAILY    . simvastatin (ZOCOR) 10 MG tablet Take 10 mg by mouth at bedtime.    Marland Kitchen tiZANidine (ZANAFLEX) 4 MG tablet TAKE 1 TABLET (4 MG TOTAL) BY MOUTH AT BEDTIME AS NEEDED FOR MUSCLE SPASMS. 30 tablet 0  . topiramate (TOPAMAX) 25 MG tablet Take 50 mg by mouth daily.     . traZODone (DESYREL) 100 MG tablet Take 100 mg by mouth at bedtime.     . TRELEGY ELLIPTA 100-62.5-25 MCG/INH AEPB INHALE 1 PUFF BY MOUTH EVERY DAY 60 each 2  . vitamin B-12 (CYANOCOBALAMIN) 1000 MCG tablet Take 1,000 mcg by mouth daily after lunch.    . furosemide (LASIX) 40 MG tablet Take 1 tablet (40 mg total) by mouth daily. 30 tablet 11   No facility-administered medications prior to visit.      Review of Systems:   Constitutional:   No  weight  loss, night sweats,  Fevers, chills,  +fatigue, or  lassitude.  HEENT:   No headaches,  Difficulty swallowing,  Tooth/dental problems, or  Sore throat,                No sneezing, itching, ear ache, nasal congestion, post nasal drip,   CV:  No chest pain,  Orthopnea, PND, swelling in lower extremities, anasarca, dizziness, palpitations, syncope.   GI  No heartburn, indigestion, abdominal pain,  nausea, vomiting, diarrhea, change in bowel habits, loss of appetite, bloody stools.   Resp:  No chest wall deformity  Skin: no rash or lesions.  GU: no dysuria, change in color of urine, no urgency or frequency.  No flank pain, no hematuria   MS:  No joint pain or swelling.  No decreased range of motion.  No back pain.    Physical Exam  BP 118/68 (BP Location: Left Arm, Cuff Size: Normal)   Pulse 81   Temp (!) 97 F (36.1 C) (Temporal)   Ht 5\' 6"  (1.676 m)   Wt 197 lb 6.4 oz (89.5 kg)   LMP 08/19/2012   SpO2 94%   BMI 31.86 kg/m   GEN: A/Ox3; pleasant , NAD, on O2    HEENT:  [place]/AT,    NOSE-clear, THROAT-clear, no lesions, no postnasal drip or exudate noted.   NECK:  Supple w/ fair ROM; no JVD; normal carotid impulses w/o bruits; no thyromegaly or nodules palpated; no lymphadenopathy.   RESP diminished breath sounds in the bases otherwise clear  no accessory muscle use, no dullness to percussion  CARD:  RRR, no m/r/g, trace  peripheral edema, pulses intact, no cyanosis or clubbing.  GI:   Soft & nt; nml bowel sounds; no organomegaly or masses detected.   Musco: Warm bil, no deformities or joint swelling noted.   Neuro: alert, no focal deficits noted.    Skin: Warm, no lesions or rashes    Lab Results:   BNP  Imaging: No results found.    No flowsheet data found.  No results found for: NITRICOXIDE      Assessment & Plan:   COPD (chronic obstructive pulmonary disease) (HCC) Moderate to severe COPD-currently well controlled on present regimen.  Have  encouraged her greatly about smoking cessation and the dangers of smoking around oxygen.   Plan  Patient Instructions  Continue on Trelegy daily, rinse after use Continue on oxygen 6 L Continue on CPAP at bedtime Work on not smoking.  Please remember not to smoke inside your home or around any oxygen devices Great job with your weight loss keep up the good work Activity as tolerated Follow-up with Dr. Elsworth Soho in 4 months and as needed      Chronic diastolic (congestive) heart failure ([place]) Appears compensated without evidence of volume overload on exam.  Continue follow-up with cardiology.  Tobacco abuse Smoking cessation discussed  Respiratory failure with hypoxia (HCC) O2 saturation adequate on oxygen.  Continue on 6 L.  O2 saturation goal greater than 88 to 90%.  Pulmonary hypertension ([place]) Probably multifactorial with underlying obstructive sleep apnea and chronic lung disease.  Continue on oxygen, Lasix.  Continue follow-up with cardiology     Rexene Edison, NP 09/24/2019

## 2019-09-27 ENCOUNTER — Other Ambulatory Visit: Payer: Self-pay | Admitting: Rheumatology

## 2019-09-27 NOTE — Telephone Encounter (Signed)
Last visit: 07/25/19 Next Visit: 01/29/20  Okay to refill per Dr. Estanislado Pandy

## 2019-10-02 ENCOUNTER — Other Ambulatory Visit: Payer: Self-pay | Admitting: Rheumatology

## 2019-10-02 NOTE — Telephone Encounter (Signed)
Last visit: 07/25/19 Next Visit: 01/29/20  Okay to refill per Dr. Estanislado Pandy

## 2019-10-17 DIAGNOSIS — M545 Low back pain: Secondary | ICD-10-CM | POA: Diagnosis not present

## 2019-10-18 ENCOUNTER — Encounter: Payer: Self-pay | Admitting: Cardiology

## 2019-10-24 ENCOUNTER — Other Ambulatory Visit: Payer: Self-pay | Admitting: Rheumatology

## 2019-10-24 NOTE — Telephone Encounter (Signed)
Last Visit: 07/25/2019 Next Visit: 01/29/2020  Okay to refill per Dr. Estanislado Pandy.

## 2019-10-29 ENCOUNTER — Other Ambulatory Visit: Payer: Self-pay | Admitting: Rheumatology

## 2019-10-30 NOTE — Telephone Encounter (Signed)
Last Visit: 07/25/2019 Next Visit: 01/29/2020  Okay to refill per Dr. Estanislado Pandy.

## 2019-11-24 ENCOUNTER — Other Ambulatory Visit: Payer: Self-pay | Admitting: Rheumatology

## 2019-11-25 ENCOUNTER — Other Ambulatory Visit: Payer: Self-pay | Admitting: Rheumatology

## 2019-11-26 NOTE — Telephone Encounter (Signed)
Last Visit: 07/25/2019 Next Visit: 01/29/2020  Okay to refill per Dr. Estanislado Pandy.

## 2019-11-28 ENCOUNTER — Other Ambulatory Visit: Payer: Self-pay | Admitting: Rheumatology

## 2019-11-28 NOTE — Telephone Encounter (Signed)
Spoke with patient and patient states she is still taking cymbalta at bedtime. Patient is scheduled for an in office appointment on 11/29/2019 for trigger point injections.

## 2019-11-28 NOTE — Telephone Encounter (Signed)
Please clarify if the patient needs a refill of cymbalta

## 2019-11-28 NOTE — Telephone Encounter (Signed)
Last Visit: 07/25/2019 Next Visit:  01/29/2020  Note from 07/25/2019 states "She continues to take Skelaxin and Zanaflex as prescribed.  She is no longer taking Cymbalta"  Okay to refill cymbalta?

## 2019-11-29 ENCOUNTER — Other Ambulatory Visit: Payer: Self-pay

## 2019-11-29 ENCOUNTER — Encounter: Payer: Self-pay | Admitting: Rheumatology

## 2019-11-29 ENCOUNTER — Ambulatory Visit (INDEPENDENT_AMBULATORY_CARE_PROVIDER_SITE_OTHER): Payer: 59 | Admitting: Rheumatology

## 2019-11-29 VITALS — BP 106/69 | HR 91 | Resp 14 | Ht 66.0 in | Wt 193.0 lb

## 2019-11-29 DIAGNOSIS — M62838 Other muscle spasm: Secondary | ICD-10-CM

## 2019-11-29 DIAGNOSIS — R5383 Other fatigue: Secondary | ICD-10-CM | POA: Diagnosis not present

## 2019-11-29 DIAGNOSIS — M7061 Trochanteric bursitis, right hip: Secondary | ICD-10-CM | POA: Diagnosis not present

## 2019-11-29 DIAGNOSIS — M797 Fibromyalgia: Secondary | ICD-10-CM

## 2019-11-29 DIAGNOSIS — I10 Essential (primary) hypertension: Secondary | ICD-10-CM | POA: Diagnosis not present

## 2019-11-29 DIAGNOSIS — Z8669 Personal history of other diseases of the nervous system and sense organs: Secondary | ICD-10-CM

## 2019-11-29 DIAGNOSIS — I272 Pulmonary hypertension, unspecified: Secondary | ICD-10-CM

## 2019-11-29 DIAGNOSIS — Z87891 Personal history of nicotine dependence: Secondary | ICD-10-CM | POA: Diagnosis not present

## 2019-11-29 DIAGNOSIS — Z8709 Personal history of other diseases of the respiratory system: Secondary | ICD-10-CM

## 2019-11-29 DIAGNOSIS — M7062 Trochanteric bursitis, left hip: Secondary | ICD-10-CM

## 2019-11-29 DIAGNOSIS — I5032 Chronic diastolic (congestive) heart failure: Secondary | ICD-10-CM

## 2019-11-29 DIAGNOSIS — D472 Monoclonal gammopathy: Secondary | ICD-10-CM

## 2019-11-29 DIAGNOSIS — I2510 Atherosclerotic heart disease of native coronary artery without angina pectoris: Secondary | ICD-10-CM

## 2019-11-29 DIAGNOSIS — F5101 Primary insomnia: Secondary | ICD-10-CM

## 2019-11-29 NOTE — Progress Notes (Signed)
Office Visit Note  Patient: Teresa Roberts             Date of Birth: 05/28/1961           MRN: [id number]             PCP: Jonathon Jordan, MD Referring: Jonathon Jordan, MD Visit Date: 11/29/2019 Occupation: @GUAROCC @  Subjective:  Trapezius muscle spasms   History of Present Illness: Teresa Roberts is a 59 y.o. female with history of fibromyalgia. She presents today with neck pain and trapezius muscle tension and spasms.  She would like trigger point injections today.  She has ongoing generalized muscle aches and muscle tenderness due to fibromyalgia, which is exacerbated by cooler weather.  She has chronic pain due to trochanteric bursitis.  She performs stretching exercises and uses bands.    Activities of Daily Living:  Patient reports joint stiffness all day  Patient Reports nocturnal pain.  Difficulty dressing/grooming: Reports Difficulty climbing stairs: Reports Difficulty getting out of chair: Denies Difficulty using hands for taps, buttons, cutlery, and/or writing: Reports  Review of Systems  Constitutional: Positive for fatigue.  HENT: Positive for mouth dryness and nose dryness. Negative for mouth sores.   Eyes: Positive for dryness. Negative for pain, itching and visual disturbance.  Respiratory: Negative for cough, hemoptysis, shortness of breath and difficulty breathing.   Cardiovascular: Negative for chest pain, palpitations, hypertension and swelling in legs/feet.  Gastrointestinal: Negative for blood in stool, constipation and diarrhea.  Endocrine: Negative for increased urination.  Genitourinary: Negative for difficulty urinating and painful urination.  Musculoskeletal: Positive for arthralgias, joint pain, muscle weakness, morning stiffness and muscle tenderness. Negative for joint swelling, myalgias and myalgias.  Skin: Negative for color change, pallor, rash, hair loss, nodules/bumps, skin tightness, ulcers and sensitivity to sunlight.  Allergic/Immunologic:  Negative for susceptible to infections.  Neurological: Negative for numbness and headaches.  Hematological: Negative for swollen glands.  Psychiatric/Behavioral: Positive for sleep disturbance. Negative for depressed mood. The patient is not nervous/anxious.     PMFS History:  Patient Active Problem List   Diagnosis Date Noted  . Iron deficiency anemia due to chronic blood loss 06/27/2019  . Coronary artery disease involving native coronary artery of native heart without angina pectoris 09/05/2018  . Syncope and collapse 09/05/2018  . Pain in joint of left elbow 09/04/2018  . Chest pain 05/27/2018  . HLD (hyperlipidemia) 05/27/2018  . Essential hypertension 05/27/2018  . Depression 05/27/2018  . OSA (obstructive sleep apnea) 05/03/2018  . Pulmonary hypertension ([place]) 05/03/2018  . Bimalleolar fracture of left ankle 08/12/2017  . Acute renal failure (ARF) ([place]) 08/09/2017  . Chronic diastolic (congestive) heart failure ([place]) 08/09/2017  . Other fatigue 06/13/2017  . Primary insomnia 06/13/2017  . History of migraine 06/13/2017  . COPD (chronic obstructive pulmonary disease) ([place]) 02/15/2017  . Tobacco abuse 02/15/2017  . Respiratory failure with hypoxia ([place]) 01/28/2017  . Migraine 01/28/2017  . Daily headache   . Hypoxia   . Non-ST elevation myocardial infarction (NSTEMI), type 2 01/26/2017  . MGUS (monoclonal gammopathy of unknown significance) 01/11/2012  . Fibromyalgia 01/11/2012  . Bruises easily 01/11/2012    Past Medical History:  Diagnosis Date  . CAD (coronary artery disease)    a. 01/2017: cath showing 20% mid RCA stenosis and otherwise normal LAD and LCx with a preserved EF of 55 to 60%; mod pulmonary HTN  . COPD (chronic obstructive pulmonary disease) ([place])    O2 dependent  . cpap   . Depression  PTSD  . Diastolic CHF ([place])   . Fibromyalgia   . Hyperlipidemia   . Hypertension   . Migraines     Family History  Problem Relation Age of Onset  . Diabetes  Mother   . Hypertension Mother   . CAD Mother   . Diabetes Father   . Lung cancer Father   . Diabetes Sister    Past Surgical History:  Procedure Laterality Date  . APPENDECTOMY    . CESAREAN SECTION    . ORIF ANKLE FRACTURE Left 08/12/2017   Procedure: OPEN REDUCTION INTERNAL FIXATION (ORIF) ANKLE FRACTURE;  Surgeon: Rod Can, MD;  Location: [facility];  Service: Orthopedics;  Laterality: Left;  . RIGHT HEART CATH N/A 07/19/2018   Procedure: RIGHT HEART CATH;  Surgeon: Jolaine Artist, MD;  Location: [facility];  Service: Cardiovascular;  Laterality: N/A;  . RIGHT/LEFT HEART CATH AND CORONARY ANGIOGRAPHY N/A 01/27/2017   Procedure: Right/Left Heart Cath and Coronary Angiography;  Surgeon: Troy Sine, MD;  Location: [facility];  Service: Cardiovascular;  Laterality: N/A;   Social History   Social History Narrative  . Not on file   Immunization History  Administered Date(s) Administered  . Influenza Split 08/15/2017, 08/24/2019  . Influenza,inj,Quad PF,6+ Mos 09/04/2018  . Influenza-Unspecified 08/15/2016  . Pneumococcal-Unspecified 02/13/2013     Objective: Vital Signs: BP 106/69 (BP Location: Left Arm, Patient Position: Sitting, Cuff Size: Large)   Pulse 91   Resp 14   Ht 5\' 6"  (1.676 m)   Wt 193 lb (87.5 kg)   LMP 08/19/2012   BMI 31.15 kg/m    Physical Exam Vitals and nursing note reviewed.  Constitutional:      Appearance: She is well-developed.  HENT:     Head: Normocephalic and atraumatic.  Eyes:     Conjunctiva/sclera: Conjunctivae normal.  Cardiovascular:     Rate and Rhythm: Normal rate and regular rhythm.     Heart sounds: Normal heart sounds.  Pulmonary:     Effort: Pulmonary effort is normal.     Breath sounds: Normal breath sounds.  Abdominal:     General: Bowel sounds are normal.     Palpations: Abdomen is soft.  Musculoskeletal:     Cervical back: Normal range of motion.  Lymphadenopathy:     Cervical: No cervical  adenopathy.  Skin:    General: Skin is warm and dry.     Capillary Refill: Capillary refill takes less than 2 seconds.  Neurological:     Mental Status: She is alert and oriented to person, place, and time.  Psychiatric:        Behavior: Behavior normal.      Musculoskeletal Exam: Generalized hyperalgesia and positive tender points. C-spine limited ROM.  Thoracic and lumbar spine good ROM.  Shoulder joints, elbow joints, wrist joints, MCPs, PIPs, and DIPs good ROM with no synovitis. Complete fist formation bilaterally.  Hip joints good ROM.  Tenderness over bilateral trochanteric bursa.  Knee joints and ankle joints good ROM with no warmth or effusion.   CDAI Exam: CDAI Score: -- Patient Global: --; Provider Global: -- Swollen: --; Tender: -- Joint Exam 11/29/2019   No joint exam has been documented for this visit   There is currently no information documented on the homunculus. Go to the Rheumatology activity and complete the homunculus joint exam.  Investigation: No additional findings.  Imaging: No results found.  Recent Labs: Lab Results  Component Value Date   WBC 5.2  08/27/2019   HGB 13.3 08/27/2019   PLT 164 08/27/2019   NA 142 08/27/2019   K 2.9 (LL) 08/27/2019   CL 100 08/27/2019   CO2 34 (H) 08/27/2019   GLUCOSE 97 08/27/2019   BUN 12 08/27/2019   CREATININE 1.04 (H) 08/27/2019   BILITOT <0.2 (L) 08/27/2019   ALKPHOS 112 08/27/2019   AST 12 (L) 08/27/2019   ALT 13 08/27/2019   PROT 7.5 08/27/2019   ALBUMIN 3.9 08/27/2019   CALCIUM 11.1 (H) 08/27/2019   GFRAA >60 08/27/2019    Speciality Comments: No specialty comments available.  Procedures:  Trigger Point Inj  Date/Time: 11/29/2019 11:22 AM Performed by: Bo Merino, MD Authorized by: Bo Merino, MD   Consent Given by:  Patient Site marked: the procedure site was marked   Timeout: prior to procedure the correct patient, procedure, and site was verified   Indications:  Pain Total  # of Trigger Points:  2 Location: neck   Needle Size:  27 G Approach:  Dorsal Medications #1:  0.5 mL lidocaine 1 %; 10 mg triamcinolone acetonide 40 MG/ML Medications #2:  0.5 mL lidocaine 1 %; 10 mg triamcinolone acetonide 40 MG/ML Patient tolerance:  Patient tolerated the procedure well with no immediate complications   Allergies: Citrus and Lisinopril   Assessment / Plan:     Visit Diagnoses: Fibromyalgia: She has generalized hyperalgesia and positive tender points on exam.  She presents today with trapezius muscle tension and muscle spasms bilaterally.  She requested trigger point injections bilaterally.  She has ongoing generalized muscle aches and muscle tenderness.  She has chronic trochanteric bursitis bilaterally, and she has been performing stretching exercise daily. We discussed the importance of performing stretching exercises and good sleep hygiene. She will follow up in 6 months.   Trapezius muscle spasm: She has tenderness, tension, and muscle spasms bilaterally. She requested trigger point injections bilaterally.  She tolerated the procedure.  Procedure note completed above.     Trochanteric bursitis of both hips: She has tenderness to palpation over bilateral trochanteric bursa.  She performs stretching exercises daily and uses bands as well.    Other fatigue: She has chronic fatigue related to insomnia.   Primary insomnia: She has difficulty sleeping at night due to nocturnal pain.   Other medical conditions are listed as follows:   History of migraine  MGUS (monoclonal gammopathy of unknown significance)  History of COPD  Former smoker  Chronic diastolic (congestive) heart failure (HCC)  Coronary artery disease involving native coronary artery of native heart without angina pectoris  Essential hypertension  Pulmonary hypertension ([place])  Orders: Orders Placed This Encounter  Procedures  . Trigger Point Inj   No orders of the defined types were placed in  this encounter.    Follow-Up Instructions: Return in about 6 months (around 05/28/2020) for Fibromyalgia.   Bo Merino, MD  Note - This record has been created using Editor, commissioning.  Chart creation errors have been sought, but may not always  have been located. Such creation errors do not reflect on  the standard of medical care.

## 2019-11-29 NOTE — Telephone Encounter (Signed)
Ok to refill cymbalta

## 2019-11-30 DIAGNOSIS — R197 Diarrhea, unspecified: Secondary | ICD-10-CM | POA: Diagnosis not present

## 2019-12-11 DIAGNOSIS — M25562 Pain in left knee: Secondary | ICD-10-CM | POA: Diagnosis not present

## 2019-12-20 ENCOUNTER — Other Ambulatory Visit: Payer: Self-pay | Admitting: Rheumatology

## 2019-12-20 NOTE — Telephone Encounter (Signed)
Last Visit: 11/29/2019 Next Visit: 05/26/2020  Okay to refill per Dr. Estanislado Pandy.

## 2019-12-23 ENCOUNTER — Other Ambulatory Visit: Payer: Self-pay | Admitting: Physician Assistant

## 2019-12-25 ENCOUNTER — Other Ambulatory Visit: Payer: Self-pay | Admitting: Rheumatology

## 2019-12-25 NOTE — Telephone Encounter (Signed)
ok 

## 2019-12-25 NOTE — Telephone Encounter (Signed)
Last Visit: 11/29/2019 Next Visit: 05/26/2020  Okay to refill Skelaxin?

## 2020-01-01 ENCOUNTER — Telehealth: Payer: Self-pay | Admitting: Rheumatology

## 2020-01-01 NOTE — Telephone Encounter (Signed)
Advised patient She may benefit from a cortisone injection.  We also need to evaluate why she has knee swelling. Patient verbalized understanding and has now been scheduled for 01/07/2020 at 11:15am.

## 2020-01-01 NOTE — Telephone Encounter (Signed)
Patient called stating her left knee is swollen and painful with walking.  Patient scheduled an appointment with Lovena Le on 01/23/20 at 3:00 pm and is on the cancellation list.  Patient is requesting a return call to let her know if there is anything Dr. Estanislado Pandy can recommend.

## 2020-01-01 NOTE — Telephone Encounter (Signed)
Patient states her left knee is swollen and very painful. She was evaluated by her PCP and was told it "looked like arthritis" and she was prescribed etodolac. Patient states she did not receive any relief from the medication. Patient denies fall, injury or warmth. Patient is scheduled for an appointment on 01/23/2020. Please advise.   Patient has fibromyalgia and trochanteric bursitis of both hips. Patient is prescribed cymbalta, skelaxin and tizanidine.

## 2020-01-01 NOTE — Telephone Encounter (Signed)
Please schedule an earlier appointment if possible to examine her knee.  I did not notice any joint swelling at the last visit.  She may benefit from a cortisone injection.  We also need to evaluate why she has knee swelling.

## 2020-01-02 NOTE — Progress Notes (Signed)
Office Visit Note  Patient: Teresa Roberts             Date of Birth: Jun 12, 1961           MRN: [id number]             PCP: Jonathon Jordan, MD Referring: Jonathon Jordan, MD Visit Date: 01/07/2020 Occupation: @GUAROCC @  Subjective:  Left Knee Pain  History of Present Illness: Teresa Roberts is a 59 y.o. female with a past medical history of fibromyalgia. She presents today with left knee pain that started 2-3 weeks ago. She states that she noticed this pain after trying to increase her walking daily for exercise. Since then, she has been unable to perform daily activities due to the pain and swelling. Patient states that walking and standing aggravate the pain. She also has difficulty moving from a sitting position to standing. She states that the pain and swelling in her left knee increases throughout the day. The pain is constant in her left knee and causes accompanying left ankle pain. She denies any pain or swelling in her right knee. She has tried to alleviate the pain with lidocaine patches, a knee brace, and by elevating the left knee without any relief. She reports worsening fibromyalgia due to the colder temperatures as well as worsening bilateral trochanteric bursitis. She states that she is sleeping well at night and uses a CPAP.  Activities of Daily Living:  Patient reports morning stiffness for 24 hours.   Patient Reports nocturnal pain.  Difficulty dressing/grooming: Denies Difficulty climbing stairs: Reports Difficulty getting out of chair: Reports Difficulty using hands for taps, buttons, cutlery, and/or writing: Reports  Review of Systems  Constitutional: Negative for fatigue.  HENT: Positive for mouth dryness and nose dryness. Negative for mouth sores.   Eyes: Negative for itching and dryness.  Respiratory: Negative for difficulty breathing.   Cardiovascular: Negative for chest pain and palpitations.  Gastrointestinal: Negative for blood in stool, constipation and diarrhea.   Endocrine: Negative for increased urination.  Genitourinary: Negative for difficulty urinating and painful urination.  Musculoskeletal: Positive for arthralgias, gait problem, joint pain, joint swelling and morning stiffness.  Skin: Negative for rash.  Allergic/Immunologic: Negative for susceptible to infections.  Neurological: Negative for dizziness, headaches, memory loss and weakness.  Hematological: Positive for bruising/bleeding tendency.  Psychiatric/Behavioral: Negative for confusion.    PMFS History:  Patient Active Problem List   Diagnosis Date Noted  . Iron deficiency anemia due to chronic blood loss 06/27/2019  . Coronary artery disease involving native coronary artery of native heart without angina pectoris 09/05/2018  . Syncope and collapse 09/05/2018  . Pain in joint of left elbow 09/04/2018  . Chest pain 05/27/2018  . HLD (hyperlipidemia) 05/27/2018  . Essential hypertension 05/27/2018  . Depression 05/27/2018  . OSA (obstructive sleep apnea) 05/03/2018  . Pulmonary hypertension ([place]) 05/03/2018  . Bimalleolar fracture of left ankle 08/12/2017  . Acute renal failure (ARF) ([place]) 08/09/2017  . Chronic diastolic (congestive) heart failure ([place]) 08/09/2017  . Other fatigue 06/13/2017  . Primary insomnia 06/13/2017  . History of migraine 06/13/2017  . COPD (chronic obstructive pulmonary disease) ([place]) 02/15/2017  . Tobacco abuse 02/15/2017  . Respiratory failure with hypoxia ([place]) 01/28/2017  . Migraine 01/28/2017  . Daily headache   . Hypoxia   . Non-ST elevation myocardial infarction (NSTEMI), type 2 01/26/2017  . MGUS (monoclonal gammopathy of unknown significance) 01/11/2012  . Fibromyalgia 01/11/2012  . Bruises easily 01/11/2012    Past Medical History:  Diagnosis Date  . CAD (coronary artery disease)    a. 01/2017: cath showing 20% mid RCA stenosis and otherwise normal LAD and LCx with a preserved EF of 55 to 60%; mod pulmonary HTN  . COPD (chronic  obstructive pulmonary disease) ([place])    O2 dependent  . cpap   . Depression    PTSD  . Diastolic CHF ([place])   . Fibromyalgia   . Hyperlipidemia   . Hypertension   . Migraines     Family History  Problem Relation Age of Onset  . Diabetes Mother   . Hypertension Mother   . CAD Mother   . Diabetes Father   . Lung cancer Father   . Diabetes Sister    Past Surgical History:  Procedure Laterality Date  . APPENDECTOMY    . CESAREAN SECTION    . ORIF ANKLE FRACTURE Left 08/12/2017   Procedure: OPEN REDUCTION INTERNAL FIXATION (ORIF) ANKLE FRACTURE;  Surgeon: Rod Can, MD;  Location: [facility];  Service: Orthopedics;  Laterality: Left;  . RIGHT HEART CATH N/A 07/19/2018   Procedure: RIGHT HEART CATH;  Surgeon: Jolaine Artist, MD;  Location: [facility];  Service: Cardiovascular;  Laterality: N/A;  . RIGHT/LEFT HEART CATH AND CORONARY ANGIOGRAPHY N/A 01/27/2017   Procedure: Right/Left Heart Cath and Coronary Angiography;  Surgeon: Troy Sine, MD;  Location: [facility];  Service: Cardiovascular;  Laterality: N/A;   Social History   Social History Narrative  . Not on file   Immunization History  Administered Date(s) Administered  . Influenza Split 08/15/2017, 08/24/2019  . Influenza,inj,Quad PF,6+ Mos 09/04/2018  . Influenza-Unspecified 08/15/2016  . Pneumococcal-Unspecified 02/13/2013     Objective: Vital Signs: BP 130/86 (BP Location: Left Arm, Patient Position: Sitting, Cuff Size: Normal)   Pulse 85   Resp 17   Ht 5\' 6"  (1.676 m)   Wt 193 lb (87.5 kg)   LMP 08/19/2012   BMI 31.15 kg/m    Physical Exam Vitals and nursing note reviewed.  Constitutional:      Appearance: She is well-developed.  HENT:     Head: Normocephalic and atraumatic.  Eyes:     Conjunctiva/sclera: Conjunctivae normal.  Cardiovascular:     Rate and Rhythm: Normal rate and regular rhythm.     Heart sounds: Normal heart sounds.  Pulmonary:     Effort: Pulmonary effort is  normal.     Breath sounds: Normal breath sounds.  Abdominal:     General: Bowel sounds are normal.     Palpations: Abdomen is soft.  Musculoskeletal:     Cervical back: Normal range of motion.  Lymphadenopathy:     Cervical: No cervical adenopathy.  Skin:    General: Skin is warm and dry.     Capillary Refill: Capillary refill takes less than 2 seconds.  Neurological:     Mental Status: She is alert and oriented to person, place, and time.  Psychiatric:        Behavior: Behavior normal.      Musculoskeletal Exam: Patient had limited painful range of motion of her cervical spine.  She had shoulder joint abduction about 110 degrees bilaterally with discomfort.  Elbow joints, wrist joints, MCPs PIPs DIPs with good range of motion with no synovitis.  She had good range of motion of bilateral hip joints.  She has warmth swelling and a small effusion in her left knee joint.  Ankle joints, MTPs and PIPs with good range of motion with no  synovitis.  CDAI Exam: CDAI Score: - Patient Global: -; Provider Global: - Swollen: -; Tender: - Joint Exam 01/07/2020   No joint exam has been documented for this visit   There is currently no information documented on the homunculus. Go to the Rheumatology activity and complete the homunculus joint exam.  Investigation: No additional findings.  Imaging: XR KNEE 3 VIEW LEFT  Result Date: 01/07/2020 No medial lateral compartment narrowing was noted.  No chondrocalcinosis was noted.  Mild patellofemoral narrowing was noted. 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   Recent Labs: Lab Results  Component Value Date   WBC 5.2 08/27/2019   HGB 13.3 08/27/2019   PLT 164 08/27/2019   NA 142 08/27/2019   K 2.9 (LL) 08/27/2019   CL 100 08/27/2019   CO2 34 (H) 08/27/2019   GLUCOSE 97 08/27/2019   BUN 12 08/27/2019   CREATININE 1.04 (H) 08/27/2019   BILITOT <0.2 (L) 08/27/2019   ALKPHOS 112 08/27/2019   AST 12 (L)  08/27/2019   ALT 13 08/27/2019   PROT 7.5 08/27/2019   ALBUMIN 3.9 08/27/2019   CALCIUM 11.1 (H) 08/27/2019   GFRAA >60 08/27/2019    Speciality Comments: No specialty comments available.  Procedures:  Large Joint Inj: L knee on 01/07/2020 12:01 PM Indications: pain Details: 27 G 1.5 in needle, medial approach  Arthrogram: No  Medications: 40 mg triamcinolone acetonide 40 MG/ML; 1.5 mL lidocaine 1 % Aspirate: 0 mL Outcome: tolerated well, no immediate complications Procedure, treatment alternatives, risks and benefits explained, specific risks discussed. Consent was given by the patient. Immediately prior to procedure a time out was called to verify the correct patient, procedure, equipment, support staff and site/side marked as required. Patient was prepped and draped in the usual sterile fashion.     Allergies: Citrus and Lisinopril   Assessment / Plan:     Visit Diagnoses:  Chronic pain of left knee -patient has been experiencing pain and discomfort in the left knee joint.  She has some warmth swelling or small effusion.  She denies any history of injury.  Although she has been walking on regular basis.  X-ray obtained today showed mild chondromalacia patella only.  After detailed counseling was provided left knee joint was injected with cortisone and lidocaine as described above.  She tolerated the procedure well.  I have advised her to contact me in case her symptoms recur.  Plan: XR KNEE 3 VIEW LEFT   Fibromyalgia -she is on cymbalta, tizanidine, skelaxin.Marland Kitchen  She has been tolerating medications well.  Trapezius muscle spasm-she continues to have some trapezius spasm.  Trochanteric bursitis of both hips-she describes chronic discomfort in her trochanteric bursa.  Other fatigue-related to insomnia.  Primary insomnia-good sleep hygiene was discussed.  History of migraine  History of COPD-she is on portable oxygen.  MGUS (monoclonal gammopathy of unknown significance)   Chronic diastolic (congestive) heart failure ([facility])  Coronary artery disease involving native coronary artery of native heart without angina pectoris  Pulmonary hypertension ([place])  Essential hypertension-her blood pressure was normal today.  Have advised her to monitor blood pressure closely after the cortisone injection.  Former smoker    Orders: Orders Placed This Encounter  Procedures  . Large Joint Inj: L knee  . XR KNEE 3 VIEW LEFT   No orders of the defined types were placed in this encounter.     Follow-Up Instructions: Return in about 6 months (around 07/06/2020) for Osteoarthritis, fibromyalgia syndrome.   Abel Presto  Estanislado Pandy, MD  Note - This record has been created using Editor, commissioning.  Chart creation errors have been sought, but may not always  have been located. Such creation errors do not reflect on  the standard of medical care.

## 2020-01-07 ENCOUNTER — Encounter: Payer: Self-pay | Admitting: Physician Assistant

## 2020-01-07 ENCOUNTER — Other Ambulatory Visit: Payer: Self-pay

## 2020-01-07 ENCOUNTER — Ambulatory Visit: Payer: Self-pay

## 2020-01-07 ENCOUNTER — Ambulatory Visit (INDEPENDENT_AMBULATORY_CARE_PROVIDER_SITE_OTHER): Payer: 59 | Admitting: Rheumatology

## 2020-01-07 VITALS — BP 130/86 | HR 85 | Resp 17 | Ht 66.0 in | Wt 193.0 lb

## 2020-01-07 DIAGNOSIS — F5101 Primary insomnia: Secondary | ICD-10-CM

## 2020-01-07 DIAGNOSIS — I272 Pulmonary hypertension, unspecified: Secondary | ICD-10-CM

## 2020-01-07 DIAGNOSIS — M7061 Trochanteric bursitis, right hip: Secondary | ICD-10-CM

## 2020-01-07 DIAGNOSIS — M797 Fibromyalgia: Secondary | ICD-10-CM | POA: Diagnosis not present

## 2020-01-07 DIAGNOSIS — R5383 Other fatigue: Secondary | ICD-10-CM

## 2020-01-07 DIAGNOSIS — I2510 Atherosclerotic heart disease of native coronary artery without angina pectoris: Secondary | ICD-10-CM

## 2020-01-07 DIAGNOSIS — Z8709 Personal history of other diseases of the respiratory system: Secondary | ICD-10-CM

## 2020-01-07 DIAGNOSIS — G8929 Other chronic pain: Secondary | ICD-10-CM

## 2020-01-07 DIAGNOSIS — I5032 Chronic diastolic (congestive) heart failure: Secondary | ICD-10-CM

## 2020-01-07 DIAGNOSIS — M62838 Other muscle spasm: Secondary | ICD-10-CM

## 2020-01-07 DIAGNOSIS — D472 Monoclonal gammopathy: Secondary | ICD-10-CM

## 2020-01-07 DIAGNOSIS — M25562 Pain in left knee: Secondary | ICD-10-CM | POA: Diagnosis not present

## 2020-01-07 DIAGNOSIS — Z87891 Personal history of nicotine dependence: Secondary | ICD-10-CM

## 2020-01-07 DIAGNOSIS — Z8669 Personal history of other diseases of the nervous system and sense organs: Secondary | ICD-10-CM

## 2020-01-07 DIAGNOSIS — I10 Essential (primary) hypertension: Secondary | ICD-10-CM

## 2020-01-07 DIAGNOSIS — M7062 Trochanteric bursitis, left hip: Secondary | ICD-10-CM

## 2020-01-07 MED ORDER — LIDOCAINE HCL 1 % IJ SOLN
1.5000 mL | INTRAMUSCULAR | Status: AC | PRN
  Administered 2020-01-07: 1.5 mL

## 2020-01-07 MED ORDER — TRIAMCINOLONE ACETONIDE 40 MG/ML IJ SUSP
40.0000 mg | INTRAMUSCULAR | Status: AC | PRN
  Administered 2020-01-07: 40 mg via INTRA_ARTICULAR

## 2020-01-16 ENCOUNTER — Other Ambulatory Visit: Payer: Self-pay | Admitting: Rheumatology

## 2020-01-16 NOTE — Telephone Encounter (Signed)
Last Visit: 01/07/20 Next Visit: 05/26/20  Okay to refill per Dr. Estanislado Pandy

## 2020-01-22 DIAGNOSIS — K625 Hemorrhage of anus and rectum: Secondary | ICD-10-CM | POA: Diagnosis not present

## 2020-01-22 DIAGNOSIS — K920 Hematemesis: Secondary | ICD-10-CM | POA: Diagnosis not present

## 2020-01-23 ENCOUNTER — Ambulatory Visit: Payer: 59 | Admitting: Physician Assistant

## 2020-01-27 ENCOUNTER — Other Ambulatory Visit: Payer: Self-pay | Admitting: Rheumatology

## 2020-01-28 NOTE — Telephone Encounter (Signed)
Last Visit: 01/07/20 Next Visit: 05/26/20  Okay to refill Skelaxin?

## 2020-01-28 NOTE — Telephone Encounter (Signed)
ok 

## 2020-01-29 ENCOUNTER — Ambulatory Visit: Payer: 59 | Admitting: Physician Assistant

## 2020-02-13 ENCOUNTER — Other Ambulatory Visit: Payer: Self-pay | Admitting: Rheumatology

## 2020-02-13 NOTE — Telephone Encounter (Signed)
Last Visit: 01/07/20 Next Visit: 05/26/20  Okay to refill per Dr. Estanislado Pandy

## 2020-02-20 ENCOUNTER — Other Ambulatory Visit: Payer: Self-pay | Admitting: Physician Assistant

## 2020-02-20 NOTE — Telephone Encounter (Signed)
Last Visit: 01/07/20 Next Visit: 05/26/20  Okay to refillper Dr. Estanislado Pandy

## 2020-02-25 ENCOUNTER — Inpatient Hospital Stay: Payer: 59

## 2020-02-25 ENCOUNTER — Inpatient Hospital Stay: Payer: 59 | Attending: Hematology | Admitting: Hematology

## 2020-03-04 DIAGNOSIS — H524 Presbyopia: Secondary | ICD-10-CM | POA: Diagnosis not present

## 2020-03-04 DIAGNOSIS — H401131 Primary open-angle glaucoma, bilateral, mild stage: Secondary | ICD-10-CM | POA: Diagnosis not present

## 2020-03-04 DIAGNOSIS — H43811 Vitreous degeneration, right eye: Secondary | ICD-10-CM | POA: Diagnosis not present

## 2020-03-04 DIAGNOSIS — H25813 Combined forms of age-related cataract, bilateral: Secondary | ICD-10-CM | POA: Diagnosis not present

## 2020-03-07 ENCOUNTER — Encounter: Payer: Self-pay | Admitting: Physician Assistant

## 2020-03-07 ENCOUNTER — Telehealth (INDEPENDENT_AMBULATORY_CARE_PROVIDER_SITE_OTHER): Payer: 59 | Admitting: Physician Assistant

## 2020-03-07 VITALS — Ht 66.0 in

## 2020-03-07 DIAGNOSIS — I5032 Chronic diastolic (congestive) heart failure: Secondary | ICD-10-CM | POA: Diagnosis not present

## 2020-03-07 DIAGNOSIS — F17210 Nicotine dependence, cigarettes, uncomplicated: Secondary | ICD-10-CM | POA: Diagnosis not present

## 2020-03-07 DIAGNOSIS — J449 Chronic obstructive pulmonary disease, unspecified: Secondary | ICD-10-CM | POA: Diagnosis not present

## 2020-03-07 DIAGNOSIS — Z9989 Dependence on other enabling machines and devices: Secondary | ICD-10-CM

## 2020-03-07 DIAGNOSIS — I2510 Atherosclerotic heart disease of native coronary artery without angina pectoris: Secondary | ICD-10-CM | POA: Diagnosis not present

## 2020-03-07 DIAGNOSIS — R0789 Other chest pain: Secondary | ICD-10-CM

## 2020-03-07 NOTE — Patient Instructions (Signed)
Medication Instructions:  Your physician recommends that you continue on your current medications as directed. Please refer to the Current Medication list given to you today.   *If you need a refill on your cardiac medications before your next appointment, please call your pharmacy*    Testing/Procedures: Your physician has requested that you have an echocardiogram. Echocardiography is a painless test that uses sound waves to create images of your heart. It provides your doctor with information about the size and shape of your heart and how well your heart's chambers and valves are working. This procedure takes approximately one hour. There are no restrictions for this procedure.   Follow-Up: At [facility], you and your health needs are our priority.  As part of our continuing mission to provide you with exceptional heart care, we have created designated Provider Care Teams.  These Care Teams include your primary Cardiologist (physician) and Advanced Practice Providers (APPs -  Physician Assistants and Nurse Practitioners) who all work together to provide you with the care you need, when you need it.  We recommend signing up for the patient portal called "MyChart".  Sign up information is provided on this After Visit Summary.  MyChart is used to connect with patients for Virtual Visits (Telemedicine).  Patients are able to view lab/test results, encounter notes, upcoming appointments, etc.  Non-urgent messages can be sent to your provider as well.   To learn more about what you can do with MyChart, go to [email or web address].    Your next appointment:   After echo  The format for your next appointment:   In Person or Virtual  Provider:   Buford Dresser, MD   Other Instructions Our office will call you to schedule your appointments.

## 2020-03-07 NOTE — Progress Notes (Signed)
Virtual Visit via Telephone Note   This visit type was conducted due to national recommendations for restrictions regarding the COVID-19 Pandemic (e.g. social distancing) in an effort to limit this patient's exposure and mitigate transmission in our community.  Due to her co-morbid illnesses, this patient is at least at moderate risk for complications without adequate follow up.  This format is felt to be most appropriate for this patient at this time.  The patient did not have access to video technology/had technical difficulties with video requiring transitioning to audio format only (telephone).  All issues noted in this document were discussed and addressed.  No physical exam could be performed with this format.  Please refer to the patient's chart for her  consent to telehealth for Teresa Roberts.  Evaluation Performed:  Follow-up visit  This visit type was conducted due to national recommendations for restrictions regarding the COVID-19 Pandemic (e.g. social distancing).  This format is felt to be most appropriate for this patient at this time.  All issues noted in this document were discussed and addressed.  No physical exam was performed (except for noted visual exam findings with Video Visits).  Please refer to the patient's chart (MyChart message for video visits and phone note for telephone visits) for the patient's consent to telehealth for Teresa Roberts.  Date:  03/07/2020   ID:  Teresa Roberts, DOB [birth year], MRN [id number]  Patient Location:  Home  Provider location:   Off-site  PCP:  Jonathon Jordan, MD  Cardiologist:  Buford Dresser, MD 09/19/2019 Electrophysiologist:  None   Chief Complaint:  Follow up  History of Present Illness:    Teresa Roberts is a 59 y.o. female who presents via audio/video conferencing for a telehealth visit today.    59 y.o. yo female who has a hx of fibromyalgia, hypertension, COPD, chronic diastolic heart failure, nononbstructive CAD,  OSA on CPAP  She is consistent with CPAP.  119/83 is her BP, generally runs like that. Does not remember a heart rate.  Cholesterol is good. PCP follows it  Fibromyalgia gives her a lot of problems, she will do things and have to rest. The pain gets worse when she gets up and moves around, does not ease. meds help.  Her COPD bothers her some, but she does not wheeze.   She has not had COVID, she is getting vaccinated, has the second shot on the 26th.   She gets occasional pains in her chest, they are sharp, severe. It makes her stop what she is doing, but will go a way in a few seconds. She gets this at least once a day. She has been getting these for the last few months.   The patient does not have symptoms concerning for COVID-19 infection (fever, chills, cough, or new shortness of breath).    Prior CV studies:   The following studies were reviewed today:  R heart cath: 07/19/2018 RA = 13 RV = 52/14 PA = 50/19 (33) PCW = 16 Fick cardiac output/index = 4.7/2.3 PVR = 3.6 WU Ao sat = 97% PA sat = 64%, 64% SVC sat = 72%   Assessment:  1. Mild PAH with mild RV strain. Left-sided pressures and cardiac output look good 2. No evidence of intracardiac shunting  ECHO: 05/28/2018 - Left ventricle: The cavity size was normal. Wall thickness was  increased in a pattern of mild LVH. Systolic function was normal.  The estimated ejection fraction was in the range of  50% to 55%.  Wall motion was normal; there were no regional wall motion  abnormalities. Indeterminate diastolic function.  - Aortic valve: Mildly calcified annulus. Trileaflet.  - Mitral valve: Mildly calcified annulus. There was trivial  regurgitation.  - Left atrium: The atrium was at the upper limits of normal in  size.  - Right ventricle: The cavity size was moderately dilated. Systolic  function was severely reduced.  - Right atrium: The atrium was mildly dilated. Central venous  pressure  (est): 3 mm Hg.  - Tricuspid valve: There was mild regurgitation.  - Pulmonary arteries: PA peak pressure: 25 mm Hg (S).  - Pericardium, extracardiac: A small pericardial effusion was  identified posterior to the heart.   Plan/Discussion:  Would continue with conservative measures including CPAP and weight loss. If pressures worsening can consider selective pulmonary artery vasodilator down the road.   Past Medical History:  Diagnosis Date  . CAD (coronary artery disease)    a. 01/2017: cath showing 20% mid RCA stenosis and otherwise normal LAD and LCx with a preserved EF of 55 to 60%; mod pulmonary HTN  . COPD (chronic obstructive pulmonary disease) ([place])    O2 dependent  . cpap   . Depression    PTSD  . Diastolic CHF ([place])   . Fibromyalgia   . Hyperlipidemia   . Hypertension   . Migraines    Past Surgical History:  Procedure Laterality Date  . APPENDECTOMY    . CESAREAN SECTION    . ORIF ANKLE FRACTURE Left 08/12/2017   Procedure: OPEN REDUCTION INTERNAL FIXATION (ORIF) ANKLE FRACTURE;  Surgeon: Rod Can, MD;  Location: [facility];  Service: Orthopedics;  Laterality: Left;  . RIGHT HEART CATH N/A 07/19/2018   Procedure: RIGHT HEART CATH;  Surgeon: Jolaine Artist, MD;  Location: [facility];  Service: Cardiovascular;  Laterality: N/A;  . RIGHT/LEFT HEART CATH AND CORONARY ANGIOGRAPHY N/A 01/27/2017   Procedure: Right/Left Heart Cath and Coronary Angiography;  Surgeon: Troy Sine, MD;  Location: [facility];  Service: Cardiovascular;  Laterality: N/A;     Current Meds  Medication Sig  . albuterol (PROVENTIL) (2.5 MG/3ML) 0.083% nebulizer solution Take 3 mLs (2.5 mg total) by nebulization every 6 (six) hours as needed for wheezing or shortness of breath.  Marland Kitchen albuterol (VENTOLIN HFA) 108 (90 Base) MCG/ACT inhaler Inhale 2 puffs into the lungs every 6 (six) hours as needed for wheezing or shortness of breath.  Marland Kitchen amLODipine (NORVASC) 2.5 MG tablet Take 2.5  mg by mouth daily.  Marland Kitchen aspirin 81 MG EC tablet Take 81 mg by mouth daily after lunch.  . busPIRone (BUSPAR) 10 MG tablet Take 10 mg by mouth 2 (two) times daily.  . dorzolamide-timolol (COSOPT) 22.3-6.8 MG/ML ophthalmic solution Place 1 drop into both eyes 2 (two) times daily.  . DULoxetine (CYMBALTA) 30 MG capsule TAKE 1 CAPSULE BY MOUTH EVERYDAY AT BEDTIME  . Fluticasone-Umeclidin-Vilant (TRELEGY ELLIPTA) 100-62.5-25 MCG/INH AEPB Inhale 1 puff into the lungs daily.  . folic acid (FOLVITE) 1 MG tablet Take 1 mg by mouth daily after lunch.  . gabapentin (NEURONTIN) 800 MG tablet Take 800 mg by mouth 2 (two) times daily.  Marland Kitchen ipratropium-albuterol (DUONEB) 0.5-2.5 (3) MG/3ML SOLN Take 3 mLs by nebulization every 6 (six) hours as needed (Shortness of breath).  . latanoprost (XALATAN) 0.005 % ophthalmic solution Place 1 drop into both eyes at bedtime.  . metaxalone (SKELAXIN) 800 MG tablet TAKE 1 TABLET (800 MG TOTAL) BY  MOUTH 3 (THREE) TIMES DAILY AS NEEDED FOR MUSCLE SPASMS.  . modafinil (PROVIGIL) 100 MG tablet Take 100 mg by mouth daily.  . Multiple Vitamins-Minerals (HAIR/SKIN/NAILS/BIOTIN) TABS Take 1 tablet by mouth daily after lunch.  Marland Kitchen omeprazole (PRILOSEC) 20 MG capsule Take by mouth daily.  . OXYGEN Inhale 6 L into the lungs continuous.   . rizatriptan (MAXALT-MLT) 10 MG disintegrating tablet Take 10 mg by mouth as needed for migraine. May repeat in 2 hours if needed  . SAXENDA 18 MG/3ML SOPN 0.6 MG SQ ONCE A DAY X1 WEEK, THEN INCREASE DOSE BY 0.6 MG/DAY EVERY WEEK (MAX  1.8 MG/DAY) DAILY  . simvastatin (ZOCOR) 10 MG tablet Take 10 mg by mouth at bedtime.  Marland Kitchen tiZANidine (ZANAFLEX) 4 MG tablet TAKE 1 TABLET (4 MG TOTAL) BY MOUTH AT BEDTIME AS NEEDED FOR MUSCLE SPASMS.  Marland Kitchen topiramate (TOPAMAX) 25 MG tablet Take 50 mg by mouth daily.   . traZODone (DESYREL) 100 MG tablet Take 100 mg by mouth at bedtime.   . vitamin B-12 (CYANOCOBALAMIN) 1000 MCG tablet Take 1,000 mcg by mouth daily after lunch.       Allergies:   Citrus and Lisinopril   Social History   Tobacco Use  . Smoking status: Current Every Day Smoker    Packs/day: 0.50    Years: 39.00    Pack years: 19.50    Types: Cigarettes    Start date: 07/21/2018  . Smokeless tobacco: Never Used  Substance Use Topics  . Alcohol use: No  . Drug use: Yes    Types: Marijuana    Comment: occ     Family Hx: The patient's family history includes CAD in her mother; Diabetes in her father, mother, and sister; Hypertension in her mother; Lung cancer in her father.  ROS:   Please see the history of present illness.    All other systems reviewed and are negative.   Labs/Other Tests and Data Reviewed:    Recent Labs: 08/27/2019: ALT 13; BUN 12; Creatinine 1.04; Hemoglobin 13.3; Platelet Count 164; Potassium 2.9; Sodium 142   CBC    Component Value Date/Time   WBC 5.2 08/27/2019 1430   WBC 6.3 06/27/2019 1218   RBC 4.82 08/27/2019 1430   HGB 13.3 08/27/2019 1430   HGB 12.7 07/12/2018 1436   HGB 13.6 09/30/2017 0954   HCT 42.4 08/27/2019 1430   HCT 44.0 07/12/2018 1436   HCT 44.8 09/30/2017 0954   PLT 164 08/27/2019 1430   PLT 206 07/12/2018 1436   MCV 88.0 08/27/2019 1430   MCV 72 (L) 07/12/2018 1436   MCV 76.8 (L) 09/30/2017 0954   MCH 27.6 08/27/2019 1430   MCHC 31.4 08/27/2019 1430   RDW 16.0 (H) 08/27/2019 1430   RDW 22.2 (H) 07/12/2018 1436   RDW 29.2 (H) 09/30/2017 0954   LYMPHSABS 1.2 08/27/2019 1430   LYMPHSABS 1.3 09/30/2017 0954   MONOABS 0.4 08/27/2019 1430   MONOABS 0.4 09/30/2017 0954   EOSABS 0.1 08/27/2019 1430   EOSABS 0.2 09/30/2017 0954   BASOSABS 0.0 08/27/2019 1430   BASOSABS 0.1 09/30/2017 0954    CMP Latest Ref Rng & Units 08/27/2019 06/27/2019 09/23/2018  Glucose 70 - 99 mg/dL 97 96 101(H)  BUN 6 - 20 mg/dL 12 10 9   Creatinine 0.44 - 1.00 mg/dL 1.04(H) 1.07(H) 0.94  Sodium 135 - 145 mmol/L 142 143 142  Potassium 3.5 - 5.1 mmol/L 2.9(LL) 4.0 3.9  Chloride 98 - 111 mmol/L 100 102 105  CO2 22 - 32 mmol/L 34(H) 28 30  Calcium 8.9 - 10.3 mg/dL 11.1(H) 10.7(H) 10.4(H)  Total Protein 6.5 - 8.1 g/dL 7.5 7.6 7.7  Total Bilirubin 0.3 - 1.2 mg/dL <0.2(L) 0.2(L) 0.5  Alkaline Phos 38 - 126 U/L 112 130(H) 111  AST 15 - 41 U/L 12(L) 16 17  ALT 0 - 44 U/L 13 17 13      Recent Lipid Panel Lab Results  Component Value Date/Time   CHOL 144 05/28/2018 06:29 AM   TRIG 147 05/28/2018 06:29 AM   HDL 31 (L) 05/28/2018 06:29 AM   CHOLHDL 4.6 05/28/2018 06:29 AM   LDLCALC 84 05/28/2018 06:29 AM   Cholesterol, total 173.000 05/31/2019 HDL 44.000 05/31/2019 LDL 101.000 05/31/2019 Triglycerides 139.000 05/31/2019 A1C 6.300 05/28/2018   Wt Readings from Last 3 Encounters:  01/07/20 193 lb (87.5 kg)  11/29/19 193 lb (87.5 kg)  09/24/19 197 lb 6.4 oz (89.5 kg)     Objective:    Vital Signs:  Ht 5\' 6"  (1.676 m)   LMP 08/19/2012   BMI 31.15 kg/m    59 y.o. female in no acute distress over the phone.   ASSESSMENT & PLAN:    1.  Chest pain:  - atypical sx but she had a small peric effusion in 2017 - will ck echo - EF nl w/ no WMA and no sig effusion>>no further eval - pt agreeable to this plan, she also agrees the pain does not last long enough to take anything for it.  2. Chronic diastolic CHF - according to pt, weight is stable - denies LE edema, orthopnea and PND  COVID-19 Education: The signs and symptoms of COVID-19 were discussed with the patient and how to seek care for testing (follow up with PCP or arrange E-visit).  The importance of social distancing was discussed today.  Patient Risk:   After full review of this patient's clinical status, I feel that they are at least moderate risk at this time.  Time:   Today, I have spent 15 minutes with the patient with telehealth technology discussing cardiac and other health issues.     Medication Adjustments/Labs and Tests Ordered: Current medicines are reviewed at length with the patient today.  Concerns regarding  medicines are outlined above.   Tests Ordered: Orders Placed This Encounter  Procedures  . ECHOCARDIOGRAM COMPLETE   Medication Changes: No orders of the defined types were placed in this encounter.   Disposition:  Follow up with Buford Dresser, MD   Signed, Rosaria Ferries, PA-C  03/07/2020 11:47 AM    [place]

## 2020-03-09 ENCOUNTER — Other Ambulatory Visit: Payer: Self-pay | Admitting: Rheumatology

## 2020-03-10 NOTE — Telephone Encounter (Signed)
Last Visit: 01/07/2020 Next Visit: 05/26/2020  Okay to refill per Dr. Estanislado Pandy.

## 2020-03-17 ENCOUNTER — Other Ambulatory Visit: Payer: Self-pay | Admitting: Rheumatology

## 2020-03-17 NOTE — Telephone Encounter (Signed)
Last Visit: 01/07/2020 Next Visit:05/26/2020  Okay to refill skelaxin?

## 2020-03-17 NOTE — Telephone Encounter (Signed)
Ok to refill 

## 2020-03-18 DIAGNOSIS — H401121 Primary open-angle glaucoma, left eye, mild stage: Secondary | ICD-10-CM | POA: Diagnosis not present

## 2020-03-18 DIAGNOSIS — H401112 Primary open-angle glaucoma, right eye, moderate stage: Secondary | ICD-10-CM | POA: Diagnosis not present

## 2020-03-24 ENCOUNTER — Other Ambulatory Visit: Payer: Self-pay | Admitting: Rheumatology

## 2020-03-25 ENCOUNTER — Other Ambulatory Visit: Payer: Self-pay | Admitting: Rheumatology

## 2020-03-26 ENCOUNTER — Ambulatory Visit (HOSPITAL_COMMUNITY): Payer: 59 | Attending: Internal Medicine

## 2020-03-26 ENCOUNTER — Other Ambulatory Visit: Payer: Self-pay

## 2020-03-26 DIAGNOSIS — I5032 Chronic diastolic (congestive) heart failure: Secondary | ICD-10-CM

## 2020-04-08 DIAGNOSIS — H401112 Primary open-angle glaucoma, right eye, moderate stage: Secondary | ICD-10-CM | POA: Diagnosis not present

## 2020-04-08 DIAGNOSIS — H401121 Primary open-angle glaucoma, left eye, mild stage: Secondary | ICD-10-CM | POA: Diagnosis not present

## 2020-04-25 DIAGNOSIS — H401131 Primary open-angle glaucoma, bilateral, mild stage: Secondary | ICD-10-CM | POA: Diagnosis not present

## 2020-04-28 ENCOUNTER — Encounter: Payer: Self-pay | Admitting: Pulmonary Disease

## 2020-04-28 ENCOUNTER — Ambulatory Visit (INDEPENDENT_AMBULATORY_CARE_PROVIDER_SITE_OTHER): Payer: 59 | Admitting: Pulmonary Disease

## 2020-04-28 ENCOUNTER — Other Ambulatory Visit: Payer: Self-pay

## 2020-04-28 DIAGNOSIS — J9611 Chronic respiratory failure with hypoxia: Secondary | ICD-10-CM | POA: Diagnosis not present

## 2020-04-28 DIAGNOSIS — G4733 Obstructive sleep apnea (adult) (pediatric): Secondary | ICD-10-CM

## 2020-04-28 DIAGNOSIS — J432 Centrilobular emphysema: Secondary | ICD-10-CM | POA: Diagnosis not present

## 2020-04-28 MED ORDER — ALBUTEROL SULFATE (2.5 MG/3ML) 0.083% IN NEBU: 2.5000 mg | INHALATION_SOLUTION | Freq: Four times a day (QID) | RESPIRATORY_TRACT | 5 refills | Status: DC | PRN

## 2020-04-28 MED ORDER — ALBUTEROL SULFATE HFA 108 (90 BASE) MCG/ACT IN AERS: 2.0000 | INHALATION_SPRAY | Freq: Four times a day (QID) | RESPIRATORY_TRACT | 5 refills | Status: DC | PRN

## 2020-04-28 MED ORDER — TRELEGY ELLIPTA 100-62.5-25 MCG/INH IN AEPB: 1.0000 | INHALATION_SPRAY | Freq: Every day | RESPIRATORY_TRACT | 5 refills | Status: DC

## 2020-04-28 NOTE — Progress Notes (Signed)
° °  I connected with  Teresa Roberts on 04/28/20 by a video enabled telemedicine application and verified that I am speaking with the correct person using two identifiers.   I discussed the limitations of evaluation and management by telemedicine. The patient expressed understanding and agreed to proceed.  59 yo yo heavy smokerfor FUof COPD and pulmonary hypertension. She smoked>30-pack-years Hypoxia quite out of proportion to degree of airway obstruction or pulmonary hypertension  Chief Complaint  Patient presents with   Follow-up    Patient feels good overall. Patient is questioning about Breztri. Currently on Trelegy. Denies SOB and cough   Breathing at baseline Compliant with Trelegy, uses albuterol as needed. Uses 5 L pulse on POC and 6 to 8 L at home.  Compliant with CPAP during sleep. Continues to smoke 5 to 10 cigarettes daily. Has received Covid vaccination  Reviewed last cath and imaging results and echo from 03/2020   Significant tests/ events reviewed  CT angiogram3/2018neg forpulmonary embolism .  ABG showed mild hypercarbia with 7.36/45/62 and 7.33/51/70 .  Echo 03/2020 nml LVEF, gr2 DD , RVSF nml Cath 07/2018  PA 50/19, PVR 3.6 WU, RA13  Spirometry4/2018ratio of 69, FEV1 of 1.15-52% and FVC of 60%  NPSG ( [facility]) which per report showed AHI of 16/hour >> auto CPAP 12-20 cm>> avg 16 cm

## 2020-04-28 NOTE — Assessment & Plan Note (Signed)
Continue Trelegy. Use albuterol MDI for breakthrough dyspnea

## 2020-04-28 NOTE — Assessment & Plan Note (Addendum)
Use 5 to 6 L of oxygen to maintain saturation 88% and above Pulmonary rehab referral when she comes back to [place]. Smoking cessation again emphasized is the most important intervention

## 2020-04-28 NOTE — Assessment & Plan Note (Signed)
Compliant with CPAP 

## 2020-04-29 ENCOUNTER — Other Ambulatory Visit: Payer: Self-pay | Admitting: *Deleted

## 2020-04-29 MED ORDER — METAXALONE 800 MG PO TABS: ORAL_TABLET | ORAL | 0 refills | Status: DC

## 2020-04-29 NOTE — Telephone Encounter (Signed)
Refill request received via fax   Last Visit: 01/07/2020 Next Visit: 05/26/2020  Last Fill: 03/17/2020  Okay to refill Metaxolone?

## 2020-04-29 NOTE — Telephone Encounter (Signed)
Ok to refill Skelaxin as prescribed.

## 2020-05-01 IMAGING — DX DG FOREARM 2V*L*
2 series · 2 of 2 positions shown · non-contrast
Comparison: None

CLINICAL DATA: Pain after fall.

EXAM:
LEFT FOREARM - 2 VIEW

[forearm ap]
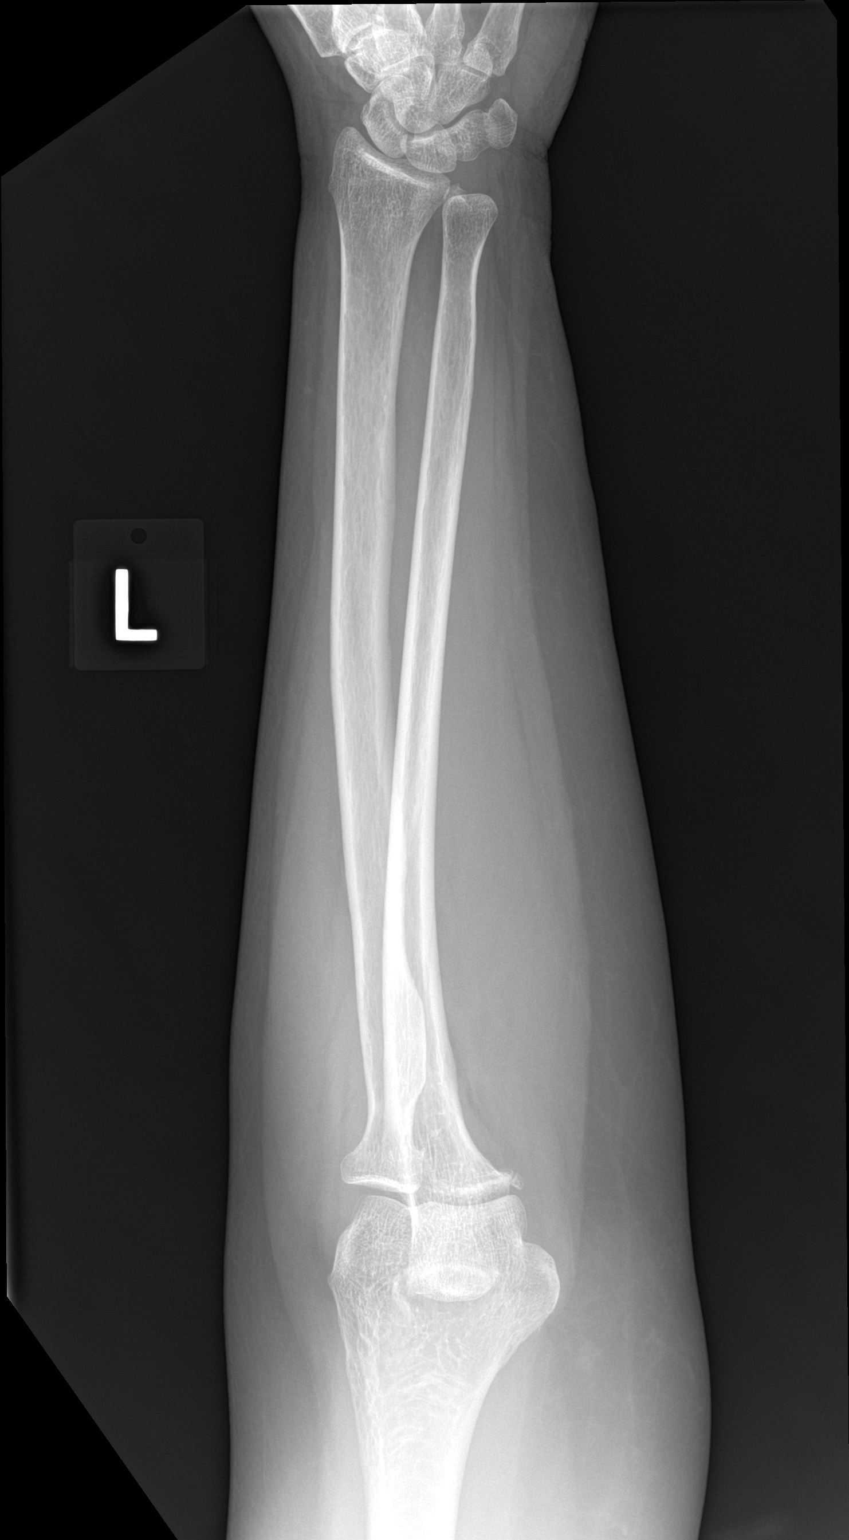

[forearm lat]
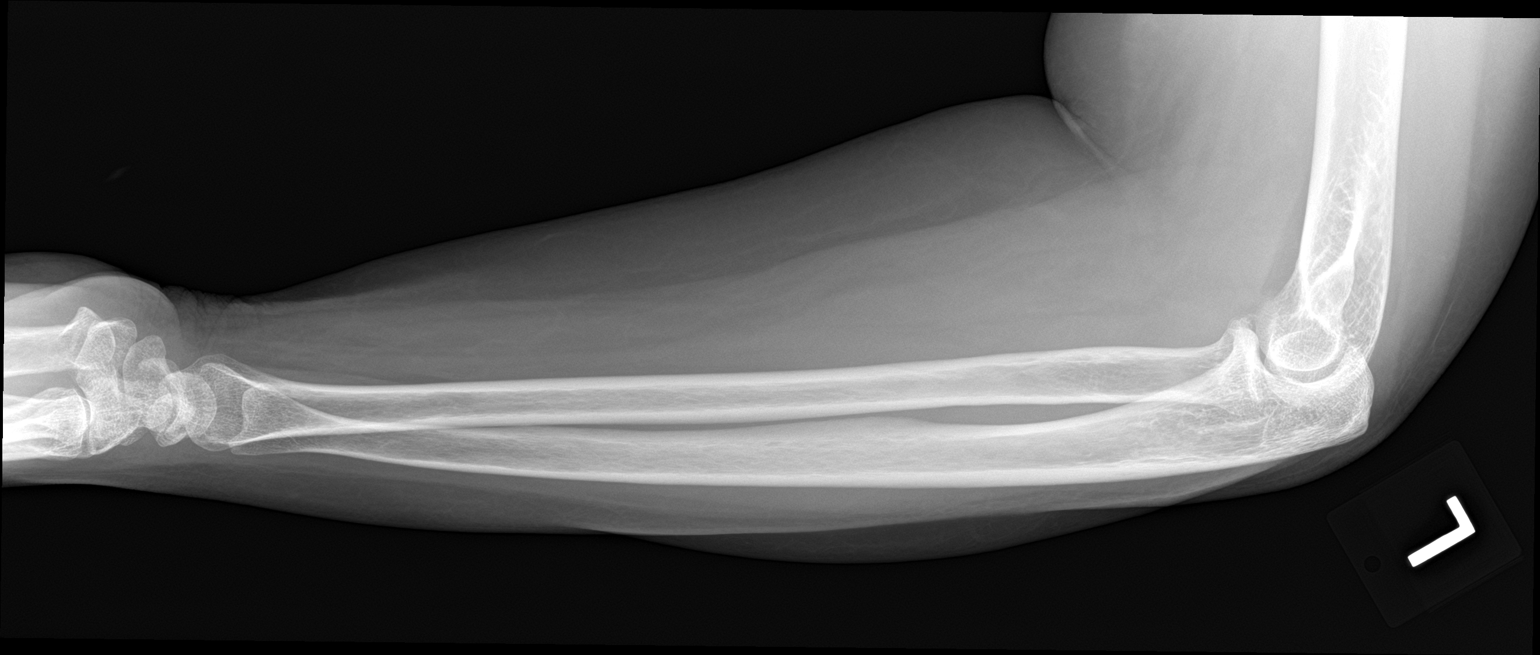

[2 of 2 positions shown; findings below may reference images not displayed]

FINDINGS: Degenerative changes in the elbow. A posterior fat pad is
identified. No obvious fracture noted.
IMPRESSION: Joint effusion with a posterior fat pad sign in the elbow suggest an
occult fracture. No definitive fracture noted.

## 2020-05-06 DIAGNOSIS — H40053 Ocular hypertension, bilateral: Secondary | ICD-10-CM | POA: Diagnosis not present

## 2020-05-06 DIAGNOSIS — H401131 Primary open-angle glaucoma, bilateral, mild stage: Secondary | ICD-10-CM | POA: Diagnosis not present

## 2020-05-14 NOTE — Progress Notes (Deleted)
Office Visit Note  Patient: Teresa Roberts             Date of Birth: [birth year]           MRN: [id number]             PCP: Jonathon Jordan, MD Referring: Jonathon Jordan, MD Visit Date: 05/26/2020 Occupation: @GUAROCC @  Subjective:  No chief complaint on file.   History of Present Illness: Teresa Roberts is a 59 y.o. female ***   Activities of Daily Living:  Patient reports morning stiffness for *** {minute/hour:19697}.   Patient {ACTIONS;DENIES/REPORTS:21021675::"Denies"} nocturnal pain.  Difficulty dressing/grooming: {ACTIONS;DENIES/REPORTS:21021675::"Denies"} Difficulty climbing stairs: {ACTIONS;DENIES/REPORTS:21021675::"Denies"} Difficulty getting out of chair: {ACTIONS;DENIES/REPORTS:21021675::"Denies"} Difficulty using hands for taps, buttons, cutlery, and/or writing: {ACTIONS;DENIES/REPORTS:21021675::"Denies"}  No Rheumatology ROS completed.   PMFS History:  Patient Active Problem List   Diagnosis Date Noted  . Iron deficiency anemia due to chronic blood loss 06/27/2019  . Coronary artery disease involving native coronary artery of native heart without angina pectoris 09/05/2018  . Syncope and collapse 09/05/2018  . Pain in joint of left elbow 09/04/2018  . Chest pain 05/27/2018  . HLD (hyperlipidemia) 05/27/2018  . Essential hypertension 05/27/2018  . Depression 05/27/2018  . OSA (obstructive sleep apnea) 05/03/2018  . Pulmonary hypertension ([place]) 05/03/2018  . Bimalleolar fracture of left ankle 08/12/2017  . Acute renal failure (ARF) ([place]) 08/09/2017  . Chronic diastolic (congestive) heart failure ([place]) 08/09/2017  . Other fatigue 06/13/2017  . Primary insomnia 06/13/2017  . History of migraine 06/13/2017  . COPD (chronic obstructive pulmonary disease) ([place]) 02/15/2017  . Tobacco abuse 02/15/2017  . Respiratory failure with hypoxia ([place]) 01/28/2017  . Migraine 01/28/2017  . Daily headache   . Hypoxia   . Non-ST elevation myocardial infarction (NSTEMI), type 2  01/26/2017  . MGUS (monoclonal gammopathy of unknown significance) 01/11/2012  . Fibromyalgia 01/11/2012  . Bruises easily 01/11/2012    Past Medical History:  Diagnosis Date  . CAD (coronary artery disease)    a. 01/2017: cath showing 20% mid RCA stenosis and otherwise normal LAD and LCx with a preserved EF of 55 to 60%; mod pulmonary HTN  . COPD (chronic obstructive pulmonary disease) ([place])    O2 dependent  . cpap   . Depression    PTSD  . Diastolic CHF ([place])   . Fibromyalgia   . Hyperlipidemia   . Hypertension   . Migraines     Family History  Problem Relation Age of Onset  . Diabetes Mother   . Hypertension Mother   . CAD Mother   . Diabetes Father   . Lung cancer Father   . Diabetes Sister    Past Surgical History:  Procedure Laterality Date  . APPENDECTOMY    . CESAREAN SECTION    . ORIF ANKLE FRACTURE Left 08/12/2017   Procedure: OPEN REDUCTION INTERNAL FIXATION (ORIF) ANKLE FRACTURE;  Surgeon: Rod Can, MD;  Location: [facility];  Service: Orthopedics;  Laterality: Left;  . RIGHT HEART CATH N/A 07/19/2018   Procedure: RIGHT HEART CATH;  Surgeon: Jolaine Artist, MD;  Location: [facility];  Service: Cardiovascular;  Laterality: N/A;  . RIGHT/LEFT HEART CATH AND CORONARY ANGIOGRAPHY N/A 01/27/2017   Procedure: Right/Left Heart Cath and Coronary Angiography;  Surgeon: Troy Sine, MD;  Location: [facility];  Service: Cardiovascular;  Laterality: N/A;   Social History   Social History Narrative  . Not on file   Immunization History  Administered Date(s) Administered  .  Influenza Split 08/15/2017, 08/24/2019  . Influenza,inj,Quad PF,6+ Mos 09/04/2018  . Influenza-Unspecified 08/15/2016  . Pneumococcal-Unspecified 02/13/2013     Objective: Vital Signs: LMP 08/19/2012    Physical Exam   Musculoskeletal Exam: ***  CDAI Exam: CDAI Score: -- Patient Global: --; Provider Global: -- Swollen: --; Tender: -- Joint Exam 05/26/2020   No  joint exam has been documented for this visit   There is currently no information documented on the homunculus. Go to the Rheumatology activity and complete the homunculus joint exam.  Investigation: No additional findings.  Imaging: No results found.  Recent Labs: Lab Results  Component Value Date   WBC 5.2 08/27/2019   HGB 13.3 08/27/2019   PLT 164 08/27/2019   NA 142 08/27/2019   K 2.9 (LL) 08/27/2019   CL 100 08/27/2019   CO2 34 (H) 08/27/2019   GLUCOSE 97 08/27/2019   BUN 12 08/27/2019   CREATININE 1.04 (H) 08/27/2019   BILITOT <0.2 (L) 08/27/2019   ALKPHOS 112 08/27/2019   AST 12 (L) 08/27/2019   ALT 13 08/27/2019   PROT 7.5 08/27/2019   ALBUMIN 3.9 08/27/2019   CALCIUM 11.1 (H) 08/27/2019   GFRAA >60 08/27/2019    Speciality Comments: No specialty comments available.  Procedures:  No procedures performed Allergies: Citrus and Lisinopril   Assessment / Plan:     Visit Diagnoses: No diagnosis found.  Orders: No orders of the defined types were placed in this encounter.  No orders of the defined types were placed in this encounter.   Face-to-face time spent with patient was *** minutes. Greater than 50% of time was spent in counseling and coordination of care.  Follow-Up Instructions: No follow-ups on file.   Earnestine Mealing, CMA  Note - This record has been created using Editor, commissioning.  Chart creation errors have been sought, but may not always  have been located. Such creation errors do not reflect on  the standard of medical care.

## 2020-05-24 ENCOUNTER — Other Ambulatory Visit: Payer: Self-pay | Admitting: Rheumatology

## 2020-05-26 ENCOUNTER — Ambulatory Visit: Payer: 59 | Admitting: Rheumatology

## 2020-05-26 ENCOUNTER — Telehealth: Payer: Self-pay | Admitting: Rheumatology

## 2020-05-26 NOTE — Telephone Encounter (Signed)
LMOM for patient to call and schedule follow-up appointment.   °

## 2020-05-26 NOTE — Telephone Encounter (Signed)
Last Visit: 01/07/2020 Next Visit: message sent to the front desk to schedule.   Current Dose per office note on 01/07/2020: dose not specified.  DX: Fibromyalgia  Okay to refill tizanidine?

## 2020-05-26 NOTE — Telephone Encounter (Signed)
-----   Message from [place] sent at 05/26/2020  8:52 AM EDT ----- Please call to schedule follow up, patient is due 06/2020. Thanks!

## 2020-06-09 ENCOUNTER — Other Ambulatory Visit: Payer: Self-pay | Admitting: Physician Assistant

## 2020-06-09 ENCOUNTER — Telehealth: Payer: Self-pay | Admitting: *Deleted

## 2020-06-09 NOTE — Telephone Encounter (Signed)
Results received from Dr. Jonathon Jordan Drawn on 06/04/2020 Reviewed by Hazel Sams, PA-C  Creatinine 1.47  GFR 44 Chloride 94 Calcium 11.6 Cholesterol 236 Calc LDL 153 Non-HDL 181 Chol/HDL Ration 4.3 PTH, Intact 85 Vitamin b12 1353  Patient on Skelaxin and Tizanidine.

## 2020-06-10 ENCOUNTER — Other Ambulatory Visit: Payer: Self-pay | Admitting: Rheumatology

## 2020-06-10 NOTE — Telephone Encounter (Signed)
Please schedule patient for a follow up visit. Patient due August 2021. Thanks!

## 2020-06-10 NOTE — Telephone Encounter (Signed)
Last Visit: 01/07/2020 Next Visit: message sent to the front desk to schedule.   Current Dose per office note on 01/07/2020: dose not specified.  DX: Fibromyalgia  Okay to refill Skelaxin?

## 2020-06-10 NOTE — Telephone Encounter (Signed)
I discussed use of Metaxalone with the patient.  I detailed discussion regarding the long-term use of high-dose muscle relaxers.  She is willing to reduce the dose of metaxalone to 500 mg p.o. 1 tablet a.m. and 1 at noon time.  I have also advised her to taper the muscle relaxers gradually as tolerated.

## 2020-06-10 NOTE — Progress Notes (Signed)
Office Visit Note  Patient: Teresa Roberts             Date of Birth: May 01, 1961           MRN: [id number]             PCP: Jonathon Jordan, MD Referring: Jonathon Jordan, MD Visit Date: 06/23/2020 Occupation: @GUAROCC @  Subjective:  Pain in both calves   History of Present Illness: Teresa Roberts is a 59 y.o. female with history of fibromyalgia.  Patient is currently having a fibromyalgia flare.  She is having generalized muscle aches and muscle tenderness.  She has not noticed any muscle weakness.  She states that over the past several month she has been experiencing pain in both calves.  She denies any weakness or muscle spasms.  She has chronic pain in both ankle joints especially the left ankle which was surgically operated on in the past.  She is not having any knee pain, warmth, or swelling.  She has ongoing trochanter bursitis bilaterally and trapezius muscle tension and tenderness. She has intentionally been trying to lose weight and is on Saxenda as prescribed.  Activities of Daily Living:  Patient reports morning stiffness for a few hours.   Patient Reports nocturnal pain.  Difficulty dressing/grooming: Denies Difficulty climbing stairs: Reports Difficulty getting out of chair: Denies Difficulty using hands for taps, buttons, cutlery, and/or writing: Reports  Review of Systems  Constitutional: Positive for fatigue.  HENT: Positive for mouth dryness and nose dryness. Negative for mouth sores.   Eyes: Positive for dryness.  Respiratory: Negative for shortness of breath and difficulty breathing.   Cardiovascular: Negative for chest pain and palpitations.  Gastrointestinal: Negative for blood in stool, constipation and diarrhea.  Endocrine: Negative for increased urination.  Genitourinary: Negative for difficulty urinating.  Musculoskeletal: Positive for arthralgias, joint pain, myalgias, morning stiffness, muscle tenderness and myalgias. Negative for joint swelling.  Skin:  Negative for color change, rash and redness.  Allergic/Immunologic: Negative for susceptible to infections.  Neurological: Positive for memory loss and weakness. Negative for dizziness, numbness and headaches.  Hematological: Positive for bruising/bleeding tendency.  Psychiatric/Behavioral: Positive for confusion.    PMFS History:  Patient Active Problem List   Diagnosis Date Noted  . Iron deficiency anemia due to chronic blood loss 06/27/2019  . Coronary artery disease involving native coronary artery of native heart without angina pectoris 09/05/2018  . Syncope and collapse 09/05/2018  . Pain in joint of left elbow 09/04/2018  . Chest pain 05/27/2018  . HLD (hyperlipidemia) 05/27/2018  . Essential hypertension 05/27/2018  . Depression 05/27/2018  . OSA (obstructive sleep apnea) 05/03/2018  . Pulmonary hypertension ([place]) 05/03/2018  . Bimalleolar fracture of left ankle 08/12/2017  . Acute renal failure (ARF) ([place]) 08/09/2017  . Chronic diastolic (congestive) heart failure ([place]) 08/09/2017  . Other fatigue 06/13/2017  . Primary insomnia 06/13/2017  . History of migraine 06/13/2017  . COPD (chronic obstructive pulmonary disease) ([place]) 02/15/2017  . Tobacco abuse 02/15/2017  . Respiratory failure with hypoxia ([place]) 01/28/2017  . Migraine 01/28/2017  . Daily headache   . Hypoxia   . Non-ST elevation myocardial infarction (NSTEMI), type 2 01/26/2017  . MGUS (monoclonal gammopathy of unknown significance) 01/11/2012  . Fibromyalgia 01/11/2012  . Bruises easily 01/11/2012    Past Medical History:  Diagnosis Date  . CAD (coronary artery disease)    a. 01/2017: cath showing 20% mid RCA stenosis and otherwise normal LAD and LCx with a preserved EF of 55 to  60%; mod pulmonary HTN  . COPD (chronic obstructive pulmonary disease) ([place])    O2 dependent  . cpap   . Depression    PTSD  . Diastolic CHF ([place])   . Fibromyalgia   . Hyperlipidemia   . Hypertension   . Migraines       Family History  Problem Relation Age of Onset  . Diabetes Mother   . Hypertension Mother   . CAD Mother   . Diabetes Father   . Lung cancer Father   . Diabetes Sister    Past Surgical History:  Procedure Laterality Date  . APPENDECTOMY    . CESAREAN SECTION    . ORIF ANKLE FRACTURE Left 08/12/2017   Procedure: OPEN REDUCTION INTERNAL FIXATION (ORIF) ANKLE FRACTURE;  Surgeon: Rod Can, MD;  Location: [facility];  Service: Orthopedics;  Laterality: Left;  . RIGHT HEART CATH N/A 07/19/2018   Procedure: RIGHT HEART CATH;  Surgeon: Jolaine Artist, MD;  Location: [facility];  Service: Cardiovascular;  Laterality: N/A;  . RIGHT/LEFT HEART CATH AND CORONARY ANGIOGRAPHY N/A 01/27/2017   Procedure: Right/Left Heart Cath and Coronary Angiography;  Surgeon: Troy Sine, MD;  Location: [facility];  Service: Cardiovascular;  Laterality: N/A;   Social History   Social History Narrative  . Not on file   Immunization History  Administered Date(s) Administered  . Influenza Split 08/15/2017, 08/24/2019  . Influenza,inj,Quad PF,6+ Mos 09/04/2018  . Influenza-Unspecified 08/15/2016  . Pneumococcal-Unspecified 02/13/2013     Objective: Vital Signs: BP 119/82 (BP Location: Left Arm, Patient Position: Sitting, Cuff Size: Normal)   Pulse 93   Resp 17   Ht 5' 6"  (1.676 m)   Wt 170 lb 9.6 oz (77.4 kg)   LMP 08/19/2012   BMI 27.54 kg/m    Physical Exam Vitals and nursing note reviewed.  Constitutional:      Appearance: She is well-developed.  HENT:     Head: Normocephalic and atraumatic.  Eyes:     Conjunctiva/sclera: Conjunctivae normal.  Pulmonary:     Effort: Pulmonary effort is normal.  Abdominal:     General: Bowel sounds are normal.     Palpations: Abdomen is soft.  Musculoskeletal:     Cervical back: Normal range of motion.  Lymphadenopathy:     Cervical: No cervical adenopathy.  Skin:    General: Skin is warm and dry.     Capillary Refill: Capillary  refill takes less than 2 seconds.  Neurological:     Mental Status: She is alert and oriented to person, place, and time.  Psychiatric:        Behavior: Behavior normal.      Musculoskeletal Exam: C-spine limited lateral rotation bilaterally.  Trapezius muscle tension and tenderness.  Thoracic and lumbar spine good ROM.  Shoulder joints, elbow joints, wrist joints, MCPs, PIPs, and DIPs good ROM with no synovitis.  Complete fist formation bilaterally. Hip joints good ROM.  Tenderness over bilateral trochanteric bursa.  Knee joints good ROM with no warmth or effusion.  Tenderness of both ankle joints.  No tenderness on the anterior surface of her shins.  Calf tenderness bilaterally.  Muscle deconditioning apparent.   CDAI Exam: CDAI Score: -- Patient Global: --; Provider Global: -- Swollen: --; Tender: -- Joint Exam 06/23/2020   No joint exam has been documented for this visit   There is currently no information documented on the homunculus. Go to the Rheumatology activity and complete the homunculus joint exam.  Investigation:  No additional findings.  Imaging: No results found.  Recent Labs: Lab Results  Component Value Date   WBC 5.2 08/27/2019   HGB 13.3 08/27/2019   PLT 164 08/27/2019   NA 142 08/27/2019   K 2.9 (LL) 08/27/2019   CL 100 08/27/2019   CO2 34 (H) 08/27/2019   GLUCOSE 97 08/27/2019   BUN 12 08/27/2019   CREATININE 1.04 (H) 08/27/2019   BILITOT <0.2 (L) 08/27/2019   ALKPHOS 112 08/27/2019   AST 12 (L) 08/27/2019   ALT 13 08/27/2019   PROT 7.5 08/27/2019   ALBUMIN 3.9 08/27/2019   CALCIUM 11.1 (H) 08/27/2019   GFRAA >60 08/27/2019    Speciality Comments: No specialty comments available.  Procedures:  No procedures performed Allergies: Citrus and Lisinopril   Assessment / Plan:     Visit Diagnoses: Fibromyalgia: She has generalized hyperalgesia and positive tender points on exam.  She is currently having a fibromyalgia flare.  She continues to take  Cymbalta 30 mg 1 capsule by mouth daily, Zanaflex 4 mg by mouth at bedtime for muscle spasms, and Skelaxin 400 mg 1 tablet twice daily as needed for muscle spasms.  We discussed that her insurance denied the refill for Skelaxin.  The preferred option is Flexeril which caused excessive daytime drowsiness and she had an intolerance to this will not be a good treatment option.  We discussed that methocarbamol may be approved but she has not found methocarbamol to be effective in the past.  She has been experiencing increased myalgias in her lower extremities especially both calves which has been going on for several months.  No calf swelling or erythema was noted.  Some muscle deconditioning and mild atrophy noted bilaterally.  She has been more sedentary over the past 1 year which may be contributing to some of her deconditioning.  She has not noticed any muscle weakness or difficulty bearing weight.  She has tenderness of both ankle joints on exam.  We will obtain the following labs today including CK, aldolase, ESR, vitamin D, and TSH to complete the work-up.  We discussed the importance of regular exercise and good sleep hygiene.  She requested a refill of tizanidine which was sent to the pharmacy.  She will follow-up in the office in 6 months.  Trapezius muscle spasm: She has trapezius muscle tension and muscle tenderness bilaterally.  She takes Zanaflex 4 mg a mouth at bedtime for muscle spasms.  A refill was sent to the pharmacy today.  Trochanteric bursitis of both hips: She has tenderness over bilateral trochanteric bursa.  She was encouraged to perform stretching exercises on a daily basis.  Other fatigue: She has chronic fatigue secondary to insomnia.  Primary insomnia: She has interrupted sleep at night due to nocturnal pain.  She takes zanaflex 4 mg po at bedtime for muscle spasms and insomnia.  She has taken Flexeril in the past but experienced severe daytime drowsiness and an  intolerance.  Other medical conditions are listed as follows:  History of migraine  History of COPD  MGUS (monoclonal gammopathy of unknown significance)  Chronic diastolic (congestive) heart failure (HCC)  Coronary artery disease involving native coronary artery of native heart without angina pectoris  Pulmonary hypertension ([place])  Essential hypertension  Former smoker  Orders: No orders of the defined types were placed in this encounter.  Meds ordered this encounter  Medications  . tiZANidine (ZANAFLEX) 4 MG tablet    Sig: Take 1 tablet (4 mg total) by mouth at bedtime  as needed for muscle spasms.    Dispense:  30 tablet    Refill:  0     Follow-Up Instructions: Return in about 6 months (around 12/24/2020) for Fibromyalgia.   Ofilia Neas, PA-C  Note - This record has been created using Dragon software.  Chart creation errors have been sought, but may not always  have been located. Such creation errors do not reflect on  the standard of medical care.

## 2020-06-11 ENCOUNTER — Other Ambulatory Visit: Payer: Self-pay | Admitting: Rheumatology

## 2020-06-11 NOTE — Telephone Encounter (Signed)
Last Visit:01/07/2020 Next Visit: 06/23/2020  Okay to refill per Dr. Estanislado Pandy

## 2020-06-12 ENCOUNTER — Telehealth: Payer: Self-pay | Admitting: *Deleted

## 2020-06-12 NOTE — Telephone Encounter (Signed)
Submitted a Prior Authorization request to [facility] for Metaxalone via Cover My Meds. Will update once we receive a response.    

## 2020-06-18 NOTE — Telephone Encounter (Signed)
Received a fax regarding Prior Authorization from [place] for Teresa Roberts. Authorization has been DENIED because must try and fail 3 or more in a class with at least 3 alternatives, 2 in a class with 2 alternatives or 1 in a class with only 1 alternative. Formulary Alternative: Cyclobenzaprine.

## 2020-06-23 ENCOUNTER — Encounter: Payer: Self-pay | Admitting: Physician Assistant

## 2020-06-23 ENCOUNTER — Other Ambulatory Visit: Payer: Self-pay

## 2020-06-23 ENCOUNTER — Ambulatory Visit (INDEPENDENT_AMBULATORY_CARE_PROVIDER_SITE_OTHER): Payer: 59 | Admitting: Physician Assistant

## 2020-06-23 VITALS — BP 119/82 | HR 93 | Resp 17 | Ht 66.0 in | Wt 170.6 lb

## 2020-06-23 DIAGNOSIS — I10 Essential (primary) hypertension: Secondary | ICD-10-CM | POA: Diagnosis not present

## 2020-06-23 DIAGNOSIS — M7062 Trochanteric bursitis, left hip: Secondary | ICD-10-CM

## 2020-06-23 DIAGNOSIS — I5032 Chronic diastolic (congestive) heart failure: Secondary | ICD-10-CM | POA: Diagnosis not present

## 2020-06-23 DIAGNOSIS — M62838 Other muscle spasm: Secondary | ICD-10-CM

## 2020-06-23 DIAGNOSIS — Z8669 Personal history of other diseases of the nervous system and sense organs: Secondary | ICD-10-CM

## 2020-06-23 DIAGNOSIS — Z8709 Personal history of other diseases of the respiratory system: Secondary | ICD-10-CM

## 2020-06-23 DIAGNOSIS — M7061 Trochanteric bursitis, right hip: Secondary | ICD-10-CM

## 2020-06-23 DIAGNOSIS — D472 Monoclonal gammopathy: Secondary | ICD-10-CM

## 2020-06-23 DIAGNOSIS — M797 Fibromyalgia: Secondary | ICD-10-CM | POA: Diagnosis not present

## 2020-06-23 DIAGNOSIS — I272 Pulmonary hypertension, unspecified: Secondary | ICD-10-CM | POA: Diagnosis not present

## 2020-06-23 DIAGNOSIS — R5383 Other fatigue: Secondary | ICD-10-CM | POA: Diagnosis not present

## 2020-06-23 DIAGNOSIS — Z87891 Personal history of nicotine dependence: Secondary | ICD-10-CM

## 2020-06-23 DIAGNOSIS — F5101 Primary insomnia: Secondary | ICD-10-CM

## 2020-06-23 DIAGNOSIS — I2510 Atherosclerotic heart disease of native coronary artery without angina pectoris: Secondary | ICD-10-CM

## 2020-06-23 MED ORDER — TIZANIDINE HCL 4 MG PO TABS: 4.0000 mg | ORAL_TABLET | Freq: Every evening | ORAL | 0 refills | Status: DC | PRN

## 2020-06-23 NOTE — Patient Instructions (Signed)
Hip Bursitis Rehab Ask your health care provider which exercises are safe for you. Do exercises exactly as told by your health care provider and adjust them as directed. It is normal to feel mild stretching, pulling, tightness, or discomfort as you do these exercises. Stop right away if you feel sudden pain or your pain gets worse. Do not begin these exercises until told by your health care provider. Stretching exercise This exercise warms up your muscles and joints and improves the movement and flexibility of your hip. This exercise also helps to relieve pain and stiffness. Iliotibial band stretch An iliotibial band is a strong band of muscle tissue that runs from the outer side of your hip to the outer side of your thigh and knee. 1. Lie on your side with your left / right leg in the top position. 2. Bend your left / right knee and grab your ankle. Stretch out your bottom arm to help you balance. 3. Slowly bring your knee back so your thigh is behind your body. 4. Slowly lower your knee toward the floor until you feel a gentle stretch on the outside of your left / right thigh. If you do not feel a stretch and your knee will not fall farther, place the heel of your other foot on top of your knee and pull your knee down toward the floor with your foot. 5. Hold this position for __________ seconds. 6. Slowly return to the starting position. Repeat __________ times. Complete this exercise __________ times a day. Strengthening exercises These exercises build strength and endurance in your hip and pelvis. Endurance is the ability to use your muscles for a long time, even after they get tired. Bridge This exercise strengthens the muscles that move your thigh backward (hip extensors). 1. Lie on your back on a firm surface with your knees bent and your feet flat on the floor. 2. Tighten your buttocks muscles and lift your buttocks off the floor until your trunk is level with your thighs. ? Do not arch  your back. ? You should feel the muscles working in your buttocks and the back of your thighs. If you do not feel these muscles, slide your feet 1-2 inches (2.5-5 cm) farther away from your buttocks. ? If this exercise is too easy, try doing it with your arms crossed over your chest. 3. Hold this position for __________ seconds. 4. Slowly lower your hips to the starting position. 5. Let your muscles relax completely after each repetition. Repeat __________ times. Complete this exercise __________ times a day. Squats This exercise strengthens the muscles in front of your thigh and knee (quadriceps). 1. Stand in front of a table, with your feet and knees pointing straight ahead. You may rest your hands on the table for balance but not for support. 2. Slowly bend your knees and lower your hips like you are going to sit in a chair. ? Keep your weight over your heels, not over your toes. ? Keep your lower legs upright so they are parallel with the table legs. ? Do not let your hips go lower than your knees. ? Do not bend lower than told by your health care provider. ? If your hip pain increases, do not bend as low. 3. Hold the squat position for __________ seconds. 4. Slowly push with your legs to return to standing. Do not use your hands to pull yourself to standing. Repeat __________ times. Complete this exercise __________ times a day. Hip hike 1. Stand   sideways on a bottom step. Stand on your left / right leg with your other foot unsupported next to the step. You can hold on to the railing or wall for balance if needed. 2. Keep your knees straight and your torso square. Then lift your left / right hip up toward the ceiling. 3. Hold this position for __________ seconds. 4. Slowly let your left / right hip lower toward the floor, past the starting position. Your foot should get closer to the floor. Do not lean or bend your knees. Repeat __________ times. Complete this exercise __________ times a  day. Single leg stand 1. Without shoes, stand near a railing or in a doorway. You may hold on to the railing or door frame as needed for balance. 2. Squeeze your left / right buttock muscles, then lift up your other foot. ? Do not let your left / right hip push out to the side. ? It is helpful to stand in front of a mirror for this exercise so you can watch your hip. 3. Hold this position for __________ seconds. Repeat __________ times. Complete this exercise __________ times a day. This information is not intended to replace advice given to you by your health care provider. Make sure you discuss any questions you have with your health care provider. Document Revised: 02/26/2019 Document Reviewed: 02/26/2019 Elsevier Patient Education  2020 [employer]  

## 2020-06-23 NOTE — Telephone Encounter (Signed)
Please start appeal process.  She is unable to take flexeril due to having a previous intolerance and severe daytime drowsiness.

## 2020-06-23 NOTE — Telephone Encounter (Signed)
Addressed with patient at 06/23/2020 visit.

## 2020-08-16 ENCOUNTER — Other Ambulatory Visit: Payer: Self-pay | Admitting: Physician Assistant

## 2020-08-18 NOTE — Telephone Encounter (Signed)
Please schedule patient for a follow up visit. Patient due February 2022. Thanks!  

## 2020-08-18 NOTE — Telephone Encounter (Signed)
Last visit: 06/23/2020 Next Visit: due February 2022. Message sent to the front to schedule patient.   Last Fill: 06/23/2020  Okay to refill Tizanidine?

## 2020-08-25 ENCOUNTER — Ambulatory Visit (INDEPENDENT_AMBULATORY_CARE_PROVIDER_SITE_OTHER): Payer: 59 | Admitting: Endocrinology

## 2020-08-25 ENCOUNTER — Encounter: Payer: Self-pay | Admitting: Endocrinology

## 2020-08-25 ENCOUNTER — Other Ambulatory Visit: Payer: Self-pay

## 2020-08-25 DIAGNOSIS — I2510 Atherosclerotic heart disease of native coronary artery without angina pectoris: Secondary | ICD-10-CM

## 2020-08-25 DIAGNOSIS — E210 Primary hyperparathyroidism: Secondary | ICD-10-CM | POA: Diagnosis not present

## 2020-08-25 DIAGNOSIS — E213 Hyperparathyroidism, unspecified: Secondary | ICD-10-CM | POA: Diagnosis not present

## 2020-08-25 DIAGNOSIS — E0590 Thyrotoxicosis, unspecified without thyrotoxic crisis or storm: Secondary | ICD-10-CM | POA: Diagnosis not present

## 2020-08-25 NOTE — Patient Instructions (Addendum)
Blood tests are requested for you today.  We'll let you know about the results.  Let's check the parathyroid x-ray.  you will receive a phone call, about a day and time for an appointment. Please see a surgery specialist.  you will receive a phone call, about a day and time for an appointment, for this, too.

## 2020-08-25 NOTE — Progress Notes (Signed)
Subjective:    Patient ID: Teresa Roberts, female    DOB: [birth year], 59 y.o.   MRN: [id number]  HPI Pt is referred by Dr Stephanie Acre, for hypercalcemia.  Pt was noted to have hypercalcemia in 2019.  she has never had osteoporosis, urolithiasis, thyroid probs, sarcoidosis, cancer, PUD, pancreatitis.  He had had these bony fractures: left ankle and left elbow (both with injuries, and both in 2019).  she does not take A supplements.  Pt denies taking antacids, Li++, or HCTZ.  She takes vit-D, 1000 units/day.  Main symptoms are chronic doe, low back pain, acral numbness, and lightheadedness.   Past Medical History:  Diagnosis Date   CAD (coronary artery disease)    a. 01/2017: cath showing 20% mid RCA stenosis and otherwise normal LAD and LCx with a preserved EF of 55 to 60%; mod pulmonary HTN   COPD (chronic obstructive pulmonary disease) (HCC)    O2 dependent   cpap    Depression    PTSD   Diastolic CHF ([place])    Fibromyalgia    Hyperlipidemia    Hypertension    Migraines     Past Surgical History:  Procedure Laterality Date   APPENDECTOMY     CESAREAN SECTION     ORIF ANKLE FRACTURE Left 08/12/2017   Procedure: OPEN REDUCTION INTERNAL FIXATION (ORIF) ANKLE FRACTURE;  Surgeon: Rod Can, MD;  Location: [place];  Service: Orthopedics;  Laterality: Left;   RIGHT HEART CATH N/A 07/19/2018   Procedure: RIGHT HEART CATH;  Surgeon: Jolaine Artist, MD;  Location: [facility];  Service: Cardiovascular;  Laterality: N/A;   RIGHT/LEFT HEART CATH AND CORONARY ANGIOGRAPHY N/A 01/27/2017   Procedure: Right/Left Heart Cath and Coronary Angiography;  Surgeon: Troy Sine, MD;  Location: [facility];  Service: Cardiovascular;  Laterality: N/A;    Social History   Socioeconomic History   Marital status: Married    Spouse name: Not on file   Number of children: Not on file   Years of education: Not on file   Highest education level: Not on file  Occupational  History   Not on file  Tobacco Use   Smoking status: Current Every Day Smoker    Packs/day: 0.50    Years: 39.00    Pack years: 19.50    Types: Cigarettes    Start date: 07/21/2018   Smokeless tobacco: Never Used  Vaping Use   Vaping Use: Never used  Substance and Sexual Activity   Alcohol use: No   Drug use: Yes    Types: Marijuana    Comment: occ   Sexual activity: Not on file  Other Topics Concern   Not on file  Social History Narrative   Not on file   Social Determinants of Health   Financial Resource Strain:    Difficulty of Paying Living Expenses: Not on file  Food Insecurity:    Worried About Charity fundraiser in the Last Year: Not on file   [employer] of Food in the Last Year: Not on file  Transportation Needs:    Lack of Transportation (Medical): Not on file   Lack of Transportation (Non-Medical): Not on file  Physical Activity:    Days of Exercise per Week: Not on file   Minutes of Exercise per Session: Not on file  Stress:    Feeling of Stress : Not on file  Social Connections:    Frequency of Communication with Friends and Family: Not on  file   Frequency of Social Gatherings with Friends and Family: Not on file   Attends Religious Services: Not on file   Active Member of Clubs or Organizations: Not on file   Attends Archivist Meetings: Not on file   Marital Status: Not on file  Intimate Partner Violence:    Fear of Current or Ex-Partner: Not on file   Emotionally Abused: Not on file   Physically Abused: Not on file   Sexually Abused: Not on file    Current Outpatient Medications on File Prior to Visit  Medication Sig Dispense Refill   albuterol (PROVENTIL) (2.5 MG/3ML) 0.083% nebulizer solution Take 3 mLs (2.5 mg total) by nebulization every 6 (six) hours as needed for wheezing or shortness of breath. 360 mL 5   albuterol (VENTOLIN HFA) 108 (90 Base) MCG/ACT inhaler Inhale 2 puffs into the lungs every 6 (six)  hours as needed for wheezing or shortness of breath. 18 g 5   amLODipine (NORVASC) 2.5 MG tablet Take 2.5 mg by mouth daily.  0   aspirin 81 MG EC tablet Take 81 mg by mouth daily after lunch.  0   busPIRone (BUSPAR) 10 MG tablet Take 10 mg by mouth 2 (two) times daily.     dorzolamide-timolol (COSOPT) 22.3-6.8 MG/ML ophthalmic solution Place 1 drop into both eyes 2 (two) times daily.     DULoxetine (CYMBALTA) 30 MG capsule TAKE 1 CAPSULE BY MOUTH EVERYDAY AT BEDTIME 30 capsule 2   Fluticasone-Umeclidin-Vilant (TRELEGY ELLIPTA) 100-62.5-25 MCG/INH AEPB Inhale 1 puff into the lungs daily. 60 each 5   folic acid (FOLVITE) 1 MG tablet Take 1 mg by mouth daily after lunch.  0   gabapentin (NEURONTIN) 800 MG tablet Take 800 mg by mouth 2 (two) times daily.     latanoprost (XALATAN) 0.005 % ophthalmic solution Place 1 drop into both eyes at bedtime.     metaxalone (SKELAXIN) 400 MG tablet Take 1 tablet by mouth in the morning and 1 tablet by mouth at noon as need for muscle spasms. 60 tablet 0   modafinil (PROVIGIL) 100 MG tablet Take 100 mg by mouth daily.  4   Multiple Vitamins-Minerals (HAIR/SKIN/NAILS/BIOTIN) TABS Take 1 tablet by mouth daily after lunch.      omeprazole (PRILOSEC) 20 MG capsule Take by mouth daily.   4   OXYGEN Inhale 8 L into the lungs continuous.      rizatriptan (MAXALT-MLT) 10 MG disintegrating tablet Take 10 mg by mouth as needed for migraine. May repeat in 2 hours if needed     SAXENDA 18 MG/3ML SOPN 0.6 MG SQ ONCE A DAY X1 WEEK, THEN INCREASE DOSE BY 0.6 MG/DAY EVERY WEEK (MAX  1.8 MG/DAY) DAILY     simvastatin (ZOCOR) 10 MG tablet Take 10 mg by mouth at bedtime.     tiZANidine (ZANAFLEX) 4 MG tablet TAKE 1 TABLET (4 MG TOTAL) BY MOUTH AT BEDTIME AS NEEDED FOR MUSCLE SPASMS. 30 tablet 0   topiramate (TOPAMAX) 25 MG tablet Take 50 mg by mouth as needed.      traZODone (DESYREL) 100 MG tablet Take 100 mg by mouth at bedtime.      vitamin B-12  (CYANOCOBALAMIN) 1000 MCG tablet Take 1,000 mcg by mouth daily after lunch.     furosemide (LASIX) 40 MG tablet Take 1 tablet (40 mg total) by mouth daily. 30 tablet 11   No current facility-administered medications on file prior to visit.    Allergies  Allergen  Reactions   Citrus Hives   Lisinopril Cough    Family History  Problem Relation Age of Onset   Diabetes Mother    Hypertension Mother    CAD Mother    Diabetes Father    Lung cancer Father    Diabetes Sister    Hypercalcemia Neg Hx     BP 124/70 (BP Location: Left Arm, Patient Position: Sitting, Cuff Size: Small)    Pulse 88    Ht 5\' 6"  (1.676 m)    Wt 175 lb 9.6 oz (79.7 kg)    LMP 08/19/2012    SpO2 98%    BMI 28.34 kg/m      Review of Systems Denies visual loss, cough, polyuria, and hematuria.  She has lost 40 lbs x 4 mos ([place]).       Objective:   Physical Exam VS: see vs page GEN: no distress.  Has 02 on HEAD: head: no deformity eyes: no periorbital swelling, no proptosis external nose and ears are normal NECK: supple, thyroid is not enlarged CHEST WALL: no deformity.  No kyphosis.   LUNGS: clear to auscultation CV: reg rate and rhythm, no murmur.  MUSCULOSKELETAL: gait is normal and steady.   EXTEMITIES: no deformity.  No leg edema PULSES: no carotid bruit NEURO:  cn 2-12 grossly intact.   readily moves all 4's.  sensation is intact to touch on all 4's SKIN:  Normal texture and temperature.  No rash or suspicious lesion is visible.   NODES:  None palpable at the neck PSYCH: alert, well-oriented.  Does not appear anxious nor depressed.  I have reviewed outside records, and summarized: Pt was noted to have elevated Ca++, and referred here.  Wellness, GERD, and CAD were also addressed.  She was noted to be on no meds which would increase Ca++ level.   outside test results are reviewed: Ca++=11.1 PTH=85 Creat=1.5 25-OH vit-D=40 TSH=0.53     Assessment & Plan:  Hypercalcemia, new  to me, uncertain etiology and prognosis.    Patient Instructions  Blood tests are requested for you today.  We'll let you know about the results.  Let's check the parathyroid x-ray.  you will receive a phone call, about a day and time for an appointment. Please see a surgery specialist.  you will receive a phone call, about a day and time for an appointment, for this, too.

## 2020-08-26 LAB — PTH, INTACT AND CALCIUM
Calcium: 10.9 mg/dL — ABNORMAL HIGH (ref 8.6–10.4)
PTH: 51 pg/mL (ref 14–64)

## 2020-08-27 ENCOUNTER — Telehealth: Payer: Self-pay

## 2020-08-27 NOTE — Telephone Encounter (Signed)
Patient aware of results and recommendations. °

## 2020-08-27 NOTE — Telephone Encounter (Signed)
-----   Message from Renato Shin, MD sent at 08/26/2020  4:43 PM EDT ----- please contact patient: I'm glad we rechecked the blood tests, because the calcium is improved, and the parathyroid is back to normal.  We should hold off on the scan and surgery specialist, at least for now.  Please come back for a follow-up appointment in 1 month.

## 2020-08-29 ENCOUNTER — Encounter: Payer: Self-pay | Admitting: Pulmonary Disease

## 2020-08-29 ENCOUNTER — Other Ambulatory Visit: Payer: Self-pay

## 2020-08-29 ENCOUNTER — Other Ambulatory Visit: Payer: Self-pay | Admitting: Pulmonary Disease

## 2020-08-29 ENCOUNTER — Ambulatory Visit (INDEPENDENT_AMBULATORY_CARE_PROVIDER_SITE_OTHER): Payer: 59 | Admitting: Pulmonary Disease

## 2020-08-29 DIAGNOSIS — I272 Pulmonary hypertension, unspecified: Secondary | ICD-10-CM | POA: Diagnosis not present

## 2020-08-29 DIAGNOSIS — J9611 Chronic respiratory failure with hypoxia: Secondary | ICD-10-CM | POA: Diagnosis not present

## 2020-08-29 DIAGNOSIS — I2510 Atherosclerotic heart disease of native coronary artery without angina pectoris: Secondary | ICD-10-CM

## 2020-08-29 DIAGNOSIS — G4733 Obstructive sleep apnea (adult) (pediatric): Secondary | ICD-10-CM

## 2020-08-29 DIAGNOSIS — Z23 Encounter for immunization: Secondary | ICD-10-CM

## 2020-08-29 DIAGNOSIS — J432 Centrilobular emphysema: Secondary | ICD-10-CM

## 2020-08-29 MED ORDER — TRELEGY ELLIPTA 100-62.5-25 MCG/INH IN AEPB: 1.0000 | INHALATION_SPRAY | Freq: Every day | RESPIRATORY_TRACT | 5 refills | Status: DC

## 2020-08-29 NOTE — Patient Instructions (Addendum)
Saturation at rest, walking on room air and on POC 5 pulse  Refills on trilogy. You have to quit smoking!  Schedule PFTs FLu shot

## 2020-08-29 NOTE — Progress Notes (Signed)
   Subjective:    Patient ID: Teresa Roberts, female    DOB: 1961/06/11, 59 y.o.   MRN: [id number]  HPI 54 yoheavy smokerfor FUof COPD, pulmonary hypertension and chronic respiratory failure with hypoxia - Hypoxia quite out of proportion to degree of airway obstruction or pulmonary hypertension -She uses 5 L pulse on exertion She smoked>30-pack-years  Continues to smoke 10 cigarettes/day. Compliant with Trelegy, uses albuterol for rescue, hardly needs that. Occasionally able to go without oxygen at rest, but desaturates on exertion, has Inogen POC 5 pulse No interim chest colds or wheezing No pedal edema  Takes trazodone for sleep and modafinil in the daytime Had an episode of "passing out" when she went to a casino, had a drink, evaluated by EMS, not hospitalized  Had a fall, accidental today twisted right ankle  Compliant with CPAP by report, no problems with mask or pressure  Oxygen saturation was 70% on room air, did not improve with 5 L pulse.  She needed 5 L continuous to get her to 94%.  On ambulation saturations stayed above 88% on 5 L continuous  Significant tests/ events reviewed  CT angiogram3/2018neg forpulmonary embolism .  ABG showed mild hypercarbia with 7.36/45/62 and 7.33/51/70 .  Echo 03/2020 nml LVEF, gr2 DD , RVSF nml Cath 07/2018 PA 50/19, PVR 3.6 WU, RA13  Spirometry4/2018ratio of 69, FEV1 of 1.15-52% and FVC of 60%  NPSG ([place])which per report showed AHI of 16/hour >> auto CPAP 12-20 cm>> avg 16 cm  Review of Systems neg for any significant sore throat, dysphagia, itching, sneezing, nasal congestion or excess/ purulent secretions, fever, chills, sweats, unintended wt loss, pleuritic or exertional cp, hempoptysis, orthopnea pnd or change in chronic leg swelling. Also denies presyncope, palpitations, heartburn, abdominal pain, nausea, vomiting, diarrhea or change in bowel or urinary habits, dysuria,hematuria, rash, arthralgias, visual complaints,  headache, numbness weakness or ataxia.     Objective:   Physical Exam  Gen. Pleasant, well-nourished, in no distress ENT - no thrush, no pallor/icterus,no post nasal drip Neck: No JVD, no thyromegaly, no carotid bruits Lungs: no use of accessory muscles, no dullness to percussion, decreased BL without rales or rhonchi  Cardiovascular: Rhythm regular, heart sounds  normal, no murmurs or gallops, no peripheral edema Musculoskeletal: No deformities, no cyanosis or clubbing Right ankle swelling, mild tenderness        Assessment & Plan:

## 2020-08-29 NOTE — Assessment & Plan Note (Signed)
Compliant with CPAP by history. We will check download

## 2020-08-29 NOTE — Assessment & Plan Note (Addendum)
She requests a new POC.  She currently owns her Inogen POC set at 5 L pulse. It does seem like 5 L pulses not adequate for her.  She needs 5 L continuous -we will send prescription to DME accordingly

## 2020-08-29 NOTE — Assessment & Plan Note (Signed)
This appears to be secondary to diastolic dysfunction and lung disease

## 2020-08-29 NOTE — Assessment & Plan Note (Signed)
Continue Trelegy for now Albuterol for rescue  Schedule PFTs to reassess. Tobacco cessation most important intervention here.  She has tried nicotine patches and Nicotrol inhaler it does not seem to be motivated to quit.  She was not willing to commit to a quit attempt

## 2020-08-31 IMAGING — MG DIGITAL SCREENING BILATERAL MAMMOGRAM WITH TOMO AND CAD
8 series · 8 of 24 positions shown · non-contrast
Comparison: Previous exam(s).

CLINICAL DATA: Screening.

EXAM:
DIGITAL SCREENING BILATERAL MAMMOGRAM WITH TOMO AND CAD

[R CC synth-2D]
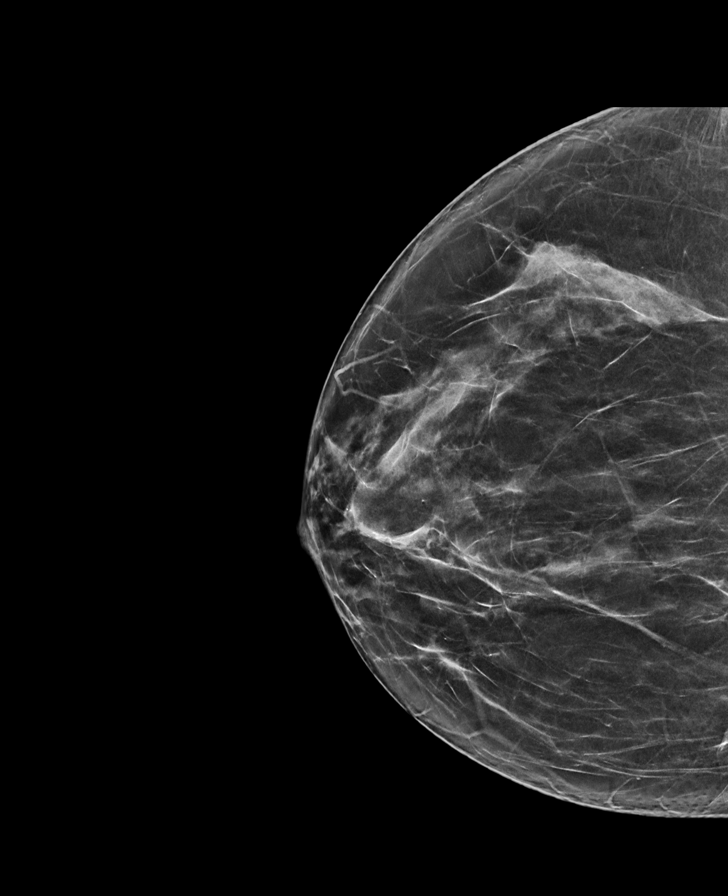

[L MLO synth-2D]
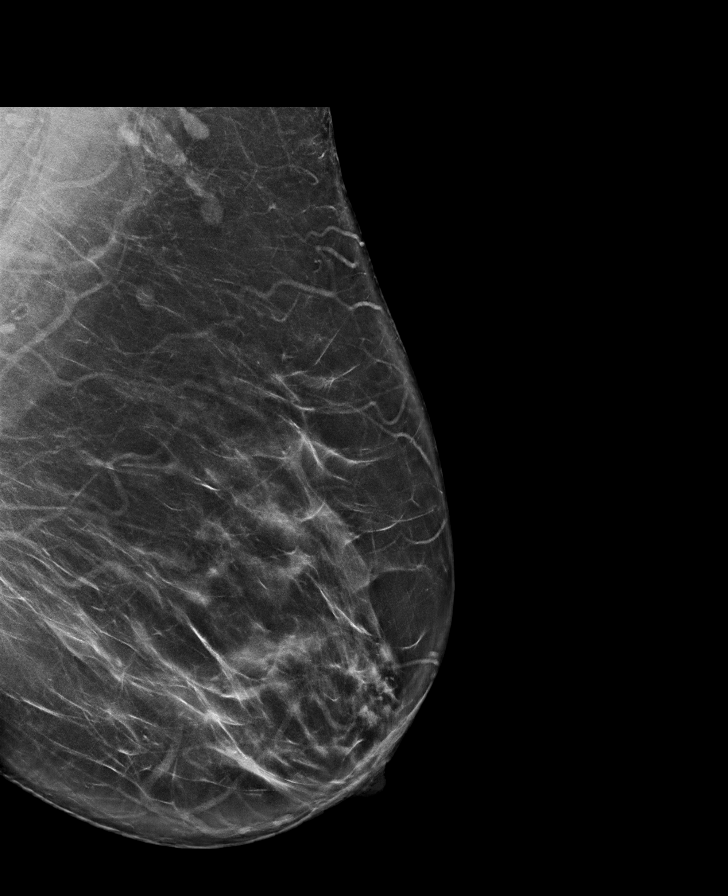

[R MLO synth-2D]
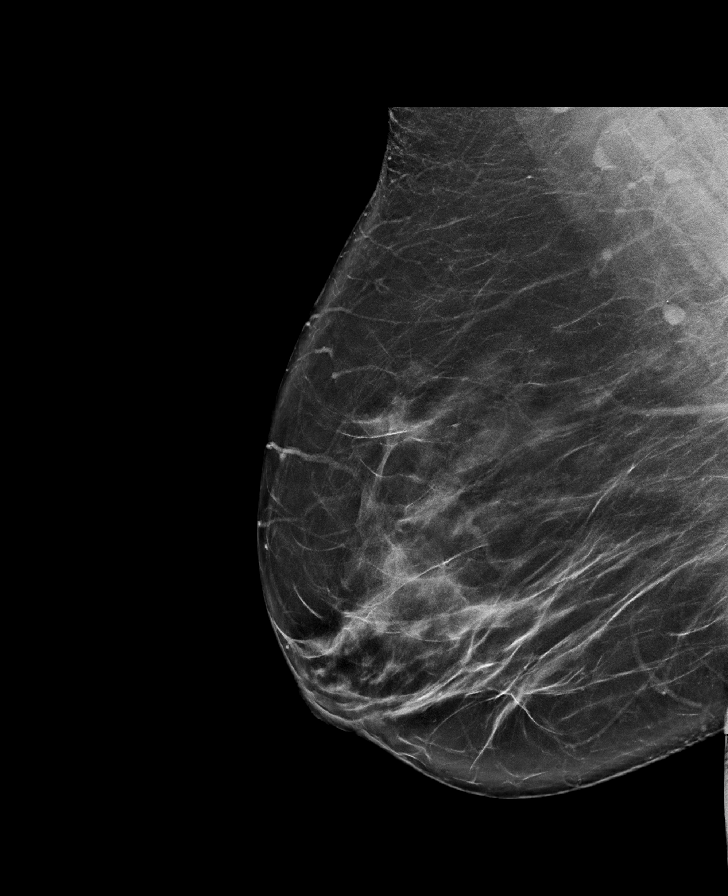

[L CC synth-2D]
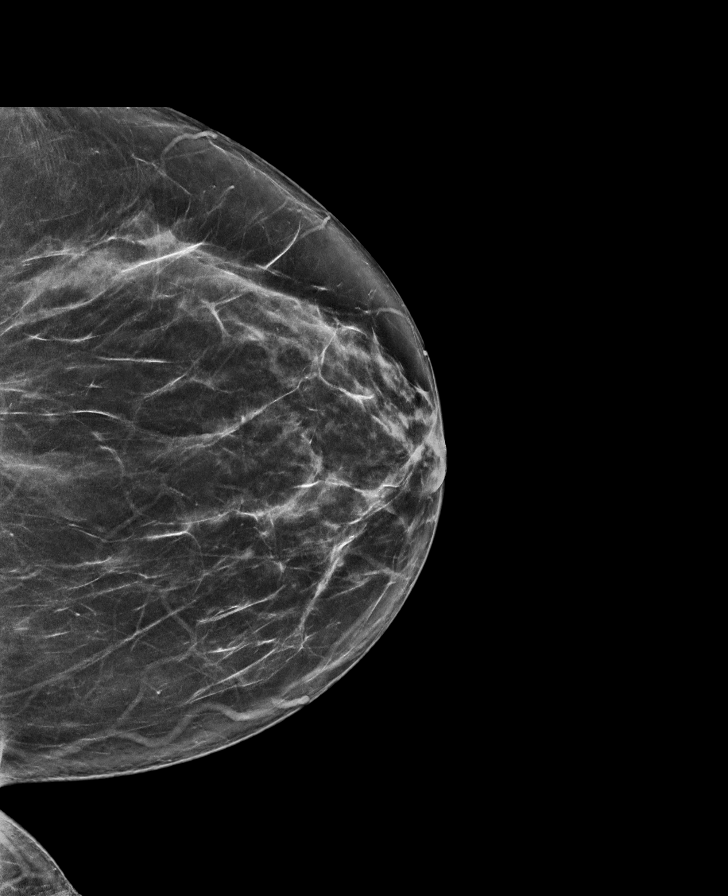

[L MLO tomo · tomo slice 47/93.0]
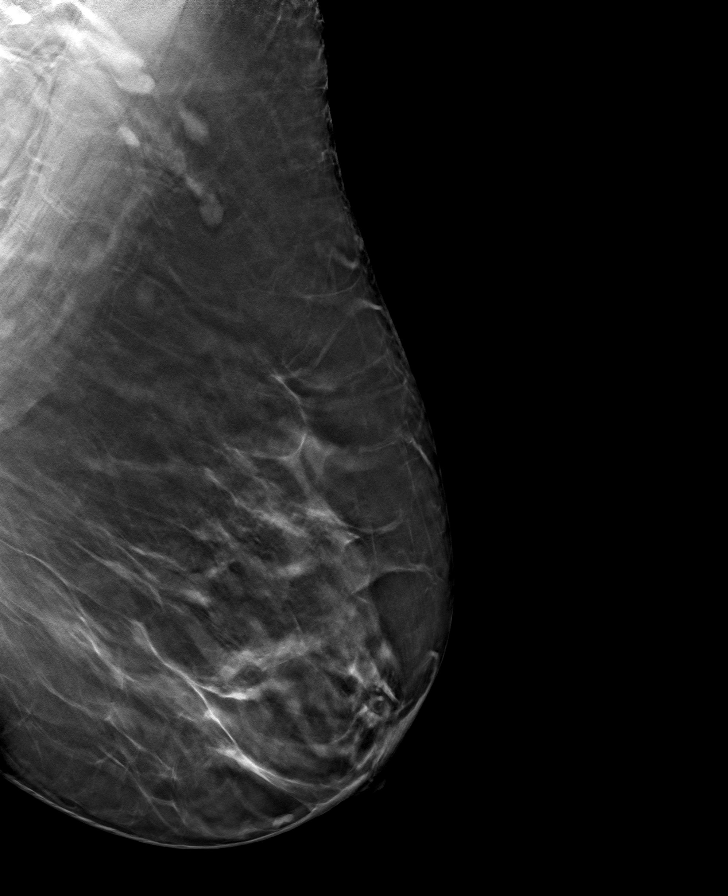

[L CC tomo · tomo slice 41/81.0]
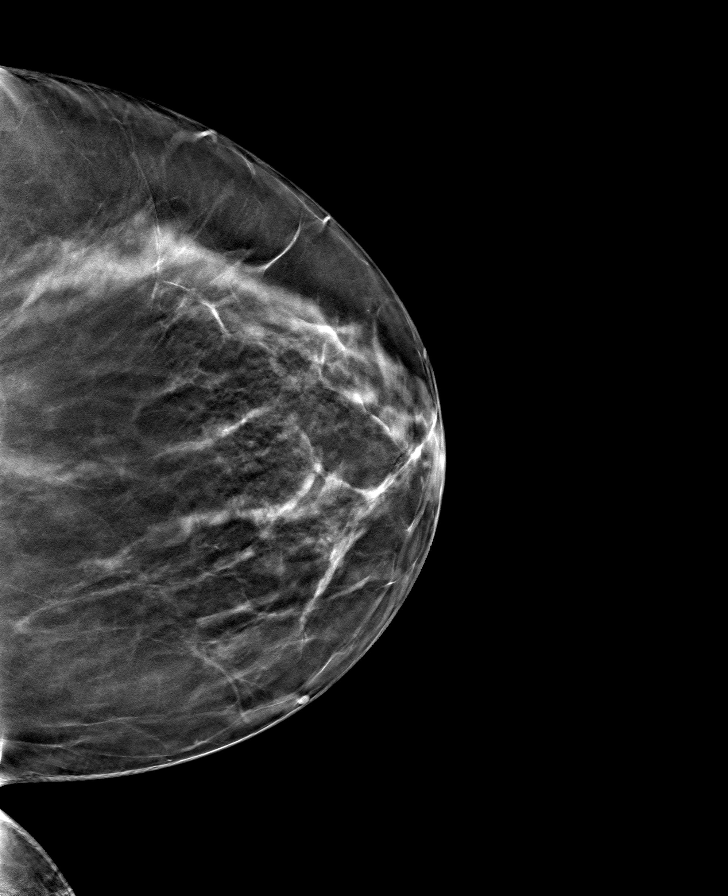

[R MLO tomo · tomo slice 47/93.0]
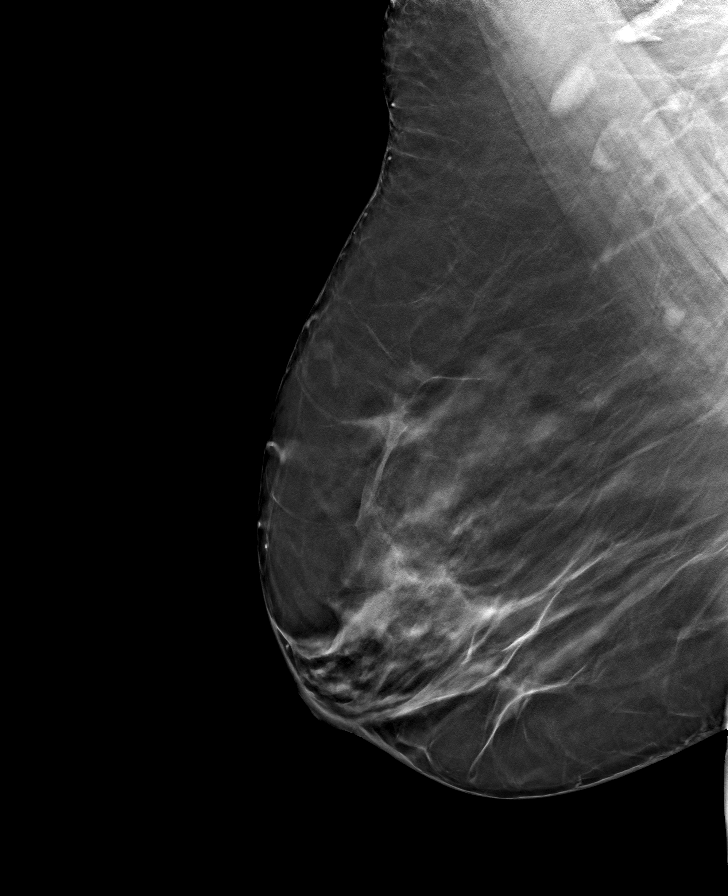

[R CC tomo · tomo slice 37/74.0]
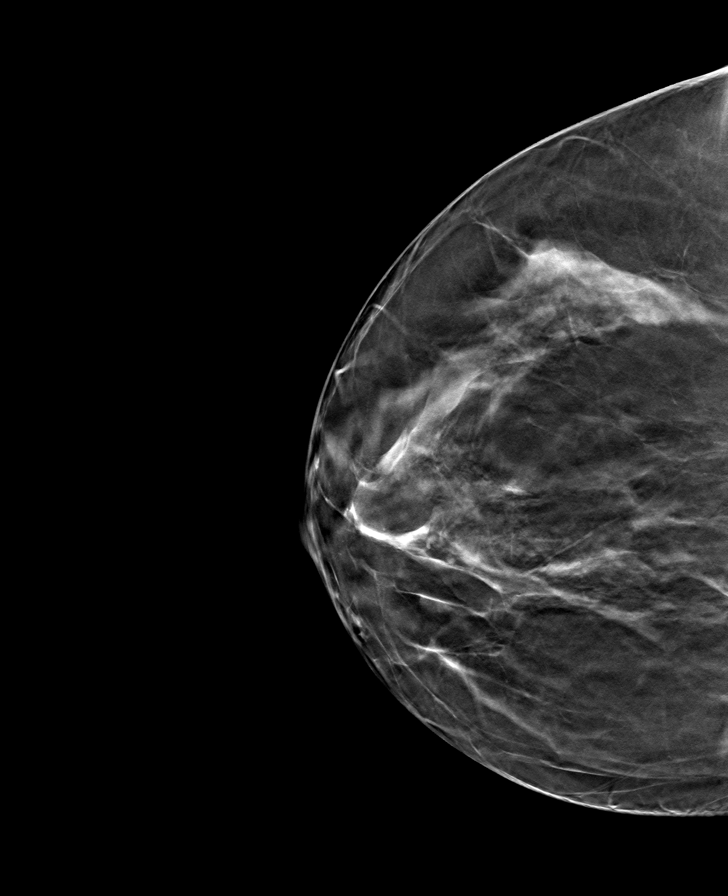

[8 of 24 positions shown; findings below may reference images not displayed]

ACR Breast Density Category b: There are scattered areas of
fibroglandular density.
FINDINGS: There are no findings suspicious for malignancy. Images were
processed with CAD.
IMPRESSION: No mammographic evidence of malignancy. A result letter of this
screening mammogram will be mailed directly to the patient.

RECOMMENDATION:
Screening mammogram in one year. (Code:[id number])

BI-RADS CATEGORY  1: Negative.

## 2020-09-01 ENCOUNTER — Ambulatory Visit (INDEPENDENT_AMBULATORY_CARE_PROVIDER_SITE_OTHER): Payer: 59 | Admitting: Cardiology

## 2020-09-01 ENCOUNTER — Other Ambulatory Visit: Payer: Self-pay

## 2020-09-01 ENCOUNTER — Encounter: Payer: Self-pay | Admitting: Cardiology

## 2020-09-01 VITALS — BP 102/72 | HR 79 | Ht 66.0 in | Wt 179.0 lb

## 2020-09-01 DIAGNOSIS — I272 Pulmonary hypertension, unspecified: Secondary | ICD-10-CM | POA: Diagnosis not present

## 2020-09-01 DIAGNOSIS — Z720 Tobacco use: Secondary | ICD-10-CM | POA: Diagnosis not present

## 2020-09-01 DIAGNOSIS — Z716 Tobacco abuse counseling: Secondary | ICD-10-CM | POA: Diagnosis not present

## 2020-09-01 DIAGNOSIS — R0789 Other chest pain: Secondary | ICD-10-CM | POA: Diagnosis not present

## 2020-09-01 DIAGNOSIS — Z7189 Other specified counseling: Secondary | ICD-10-CM

## 2020-09-01 DIAGNOSIS — I10 Essential (primary) hypertension: Secondary | ICD-10-CM | POA: Diagnosis not present

## 2020-09-01 DIAGNOSIS — J9611 Chronic respiratory failure with hypoxia: Secondary | ICD-10-CM

## 2020-09-01 DIAGNOSIS — I2510 Atherosclerotic heart disease of native coronary artery without angina pectoris: Secondary | ICD-10-CM | POA: Diagnosis not present

## 2020-09-01 DIAGNOSIS — I5032 Chronic diastolic (congestive) heart failure: Secondary | ICD-10-CM | POA: Diagnosis not present

## 2020-09-01 DIAGNOSIS — R55 Syncope and collapse: Secondary | ICD-10-CM

## 2020-09-01 NOTE — Progress Notes (Signed)
Cardiology Office Note:    Date:  09/01/2020   ID:  Teresa Roberts, DOB July 28, 1961, MRN [id number]  PCP:  Teresa Jordan, MD  Cardiologist:  Teresa Dresser, MD PhD  Referring MD: Teresa Jordan, MD   CC: follow up  History of Present Illness:    Teresa Roberts is a 59 y.o. female with a hx of fibromyalgia, hypertension, COPD on home O2, chronic diastolic heart failure, nononbstructive CAD who is seen in follow up. I met her 07/11/2018 at the request of Dr. Stephanie Roberts for evaluation and management of shortness of breath.  Today: No new concerns today. Reviewed echo results today. Still struggling with fibromyalgia pain. Has rare sharp chest pain, very brief, resolves without intervention.  Reports that she had syncope recently. Was walking from the house to the car. No prodrome. Out only briefly, no post event confusion, no immediate loss of bowel or bladder control. Discussed [place] DMV recommendations for no driving for 6 mos.   Denies PND, orthopnea, LE edema or unexpected weight gain. No palpitations.  Still smoking, about 1/2 ppd. Counseled on complete cessation.   Past Medical History:  Diagnosis Date  . CAD (coronary artery disease)    a. 01/2017: cath showing 20% mid RCA stenosis and otherwise normal LAD and LCx with a preserved EF of 55 to 60%; mod pulmonary HTN  . COPD (chronic obstructive pulmonary disease) ([facility])    O2 dependent  . cpap   . Depression    PTSD  . Diastolic CHF ([facility])   . Fibromyalgia   . Hyperlipidemia   . Hypertension   . Migraines     Past Surgical History:  Procedure Laterality Date  . APPENDECTOMY    . CESAREAN SECTION    . ORIF ANKLE FRACTURE Left 08/12/2017   Procedure: OPEN REDUCTION INTERNAL FIXATION (ORIF) ANKLE FRACTURE;  Surgeon: Rod Can, MD;  Location: [facility];  Service: Orthopedics;  Laterality: Left;  . RIGHT HEART CATH N/A 07/19/2018   Procedure: RIGHT HEART CATH;  Surgeon: Jolaine Artist, MD;  Location: [facility];   Service: Cardiovascular;  Laterality: N/A;  . RIGHT/LEFT HEART CATH AND CORONARY ANGIOGRAPHY N/A 01/27/2017   Procedure: Right/Left Heart Cath and Coronary Angiography;  Surgeon: Troy Sine, MD;  Location: [facility];  Service: Cardiovascular;  Laterality: N/A;    Current Medications: Current Outpatient Medications on File Prior to Visit  Medication Sig  . albuterol (PROVENTIL) (2.5 MG/3ML) 0.083% nebulizer solution Take 3 mLs (2.5 mg total) by nebulization every 6 (six) hours as needed for wheezing or shortness of breath.  Marland Kitchen albuterol (VENTOLIN HFA) 108 (90 Base) MCG/ACT inhaler Inhale 2 puffs into the lungs every 6 (six) hours as needed for wheezing or shortness of breath.  Marland Kitchen amLODipine (NORVASC) 2.5 MG tablet Take 2.5 mg by mouth daily.  Marland Kitchen aspirin 81 MG EC tablet Take 81 mg by mouth daily after lunch.  . busPIRone (BUSPAR) 10 MG tablet Take 10 mg by mouth 2 (two) times daily.  . dorzolamide-timolol (COSOPT) 22.3-6.8 MG/ML ophthalmic solution Place 1 drop into both eyes 2 (two) times daily.  . DULoxetine (CYMBALTA) 30 MG capsule TAKE 1 CAPSULE BY MOUTH EVERYDAY AT BEDTIME  . Fluticasone-Umeclidin-Vilant (TRELEGY ELLIPTA) 100-62.5-25 MCG/INH AEPB Inhale 1 puff into the lungs daily.  . folic acid (FOLVITE) 1 MG tablet Take 1 mg by mouth daily after lunch.  . furosemide (LASIX) 40 MG tablet Take 1 tablet (40 mg total) by mouth daily.  Marland Kitchen gabapentin (NEURONTIN) 800  MG tablet Take 800 mg by mouth 2 (two) times daily.  Marland Kitchen latanoprost (XALATAN) 0.005 % ophthalmic solution Place 1 drop into both eyes at bedtime.  . metaxalone (SKELAXIN) 400 MG tablet Take 1 tablet by mouth in the morning and 1 tablet by mouth at noon as need for muscle spasms.  . modafinil (PROVIGIL) 100 MG tablet Take 100 mg by mouth daily.  . Multiple Vitamins-Minerals (HAIR/SKIN/NAILS/BIOTIN) TABS Take 1 tablet by mouth daily after lunch.   Marland Kitchen omeprazole (PRILOSEC) 20 MG capsule Take by mouth daily.   . OXYGEN Inhale 8 L  into the lungs continuous.   . rizatriptan (MAXALT-MLT) 10 MG disintegrating tablet Take 10 mg by mouth as needed for migraine. May repeat in 2 hours if needed  . SAXENDA 18 MG/3ML SOPN 0.6 MG SQ ONCE A DAY X1 WEEK, THEN INCREASE DOSE BY 0.6 MG/DAY EVERY WEEK (MAX  1.8 MG/DAY) DAILY  . simvastatin (ZOCOR) 10 MG tablet Take 10 mg by mouth at bedtime.  Marland Kitchen tiZANidine (ZANAFLEX) 4 MG tablet TAKE 1 TABLET (4 MG TOTAL) BY MOUTH AT BEDTIME AS NEEDED FOR MUSCLE SPASMS.  Marland Kitchen topiramate (TOPAMAX) 25 MG tablet Take 50 mg by mouth as needed.   . traZODone (DESYREL) 100 MG tablet Take 100 mg by mouth at bedtime.   . vitamin B-12 (CYANOCOBALAMIN) 1000 MCG tablet Take 1,000 mcg by mouth daily after lunch.   No current facility-administered medications on file prior to visit.     Allergies:   Citrus and Lisinopril   Social History   Tobacco Use  . Smoking status: Current Every Day Smoker    Packs/day: 0.50    Years: 39.00    Pack years: 19.50    Types: Cigarettes    Start date: 07/21/2018  . Smokeless tobacco: Never Used  Vaping Use  . Vaping Use: Never used  Substance Use Topics  . Alcohol use: No  . Drug use: Yes    Types: Marijuana    Comment: occ    Family History: The patient's family history includes CAD in her mother; Diabetes in her father, mother, and sister; Hypertension in her mother; Lung cancer in her father. There is no history of Hypercalcemia. No family history of use of oxygen, no heart failure.  ROS:   Please see the history of present illness.  Additional pertinent ROS otherwise unremarkable.  EKGs/Labs/Other Studies Reviewed:    The following studies were reviewed today: Echo 03/26/20 1. Left ventricular ejection fraction, by estimation, is 55 to 60%. The  left ventricle has normal function. The left ventricle has no regional  wall motion abnormalities. Left ventricular diastolic parameters are  consistent with Grade II diastolic  dysfunction (pseudonormalization).  2.  Right ventricular systolic function is normal. The right ventricular  size is normal.  3. The mitral valve is grossly normal. Trivial mitral valve  regurgitation.  4. The aortic valve is tricuspid. Aortic valve regurgitation is not  visualized.  5. The inferior vena cava is normal in size with <50% respiratory  variability, suggesting right atrial pressure of 8 mmHg.   Comparison(s): Changes from prior study are noted. 05/28/2018: LVEF 50-55%.   [facility] 07/19/18 Findings: RA = 13 RV = 52/14 PA = 50/19 (33) PCW = 16 Fick cardiac output/index = 4.7/2.3 PVR = 3.6 WU Ao sat = 97% PA sat = 64%, 64% SVC sat = 72%   Assessment: 1. Mild PAH with mild RV strain. Left-sided pressures and cardiac output look good 2. No evidence of  intracardiac shunting  Plan/Discussion: Would continue with conservative measures including CPAP and weight loss. If pressures worsening can consider selective pulmonary artery vasodilator down the road.   R/LHC 01/27/17  Mid RCA lesion, 20 %stenosed.  The left ventricular ejection fraction is 55-65% by visual estimate.  The left ventricular systolic function is normal.  LV end diastolic pressure is normal.   Moderate right sided heart pressure elevation with moderate pulmonary hypertension. Normal systolic function with moderate left ventricular hypertrophy with EF estimate of approximately 60%. 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  RECOMMENDATION: Medical therapy  Right Heart Pressures RA: A wave 15, V-wave 13, mean 12 RV: 55/14 PA: 55/19 PW: mean16  AO: 134/72 PA: 55/19; mean 33  LV: 131/18 PW: a 19; v 17; mean 16  LV: 114/17 AO: 114/66; mean 84  Oxygen saturation 92% in the aorta and 68% in the pulmonary artery.  Cardiac output by the thermodilution method 6.5 and by the Fick method 6.2 L/m. Cardiac index 3.6 and 3.3 L/m/m, respectively   Echo  05/28/18 Study Conclusions  - Left ventricle: The cavity size was normal. Wall thickness was   increased in a pattern of mild LVH. Systolic function was normal.   The estimated ejection fraction was in the range of 50% to 55%.   Wall motion was normal; there were no regional wall motion   abnormalities. Indeterminate diastolic function. - Aortic valve: Mildly calcified annulus. Trileaflet. - Mitral valve: Mildly calcified annulus. There was trivial   regurgitation. - Left atrium: The atrium was at the upper limits of normal in   size. - Right ventricle: The cavity size was moderately dilated. Systolic   function was severely reduced. - Right atrium: The atrium was mildly dilated. Central venous   pressure (est): 3 mm Hg. - Tricuspid valve: There was mild regurgitation. - Pulmonary arteries: PA peak pressure: 25 mm Hg (S). - Pericardium, extracardiac: A small pericardial effusion was   identified posterior to the heart.  EKG:  The ekg ordered today demonstrates normal sinus rhythm with nonspecific ST changes, HR 79 bpm  Recent Labs: No results found for requested labs within last 8760 hours.  Recent Lipid Panel    Component Value Date/Time   CHOL 144 05/28/2018 0629   TRIG 147 05/28/2018 0629   HDL 31 (L) 05/28/2018 0629   CHOLHDL 4.6 05/28/2018 0629   VLDL 29 05/28/2018 0629   LDLCALC 84 05/28/2018 0629    Physical Exam:    VS:  BP 102/72   Pulse 79   Ht 5' 6"  (1.676 m)   Wt 179 lb (81.2 kg)   LMP 08/19/2012   SpO2 91%   BMI 28.89 kg/m     Wt Readings from Last 3 Encounters:  09/01/20 179 lb (81.2 kg)  08/29/20 176 lb 3.2 oz (79.9 kg)  08/25/20 175 lb 9.6 oz (79.7 kg)    GEN: Well nourished, well developed in no acute distress. Nasal cannula in place, using oxygen concentrator HEENT: Normal, moist mucous membranes NECK: No JVD visible sitting upright CARDIAC: regular rhythm, normal S1 and S2, no rubs or gallops. No murmur. VASCULAR: Radial and DP pulses 2+  bilaterally. No carotid bruits RESPIRATORY:  Distant breath sounds, no wheezing appreciated ABDOMEN: Soft, non-tender, non-distended MUSCULOSKELETAL:  Ambulates independently SKIN: Warm and dry, no edema NEUROLOGIC:  Alert and oriented x 3. No focal neuro deficits noted. PSYCHIATRIC:  Normal affect   ASSESSMENT:  1. Syncope and collapse   2. Pulmonary hypertension ([place])   3. Chronic diastolic (congestive) heart failure (HCC)   4. Other chest pain   5. Essential hypertension   6. Tobacco abuse   7. Chronic respiratory failure with hypoxia (HCC)   8. Cardiac risk counseling   9. Counseling on health promotion and disease prevention   10. Nonocclusive coronary atherosclerosis of native coronary artery    PLAN:    Recent syncope: while walking to car. No prodrome -I did discuss the [place] DMV medical guidelines for driving: "it is prudent to recommend that all persons should be free of syncopal episodes for at least six months to be granted the driving privilege." ([place], Second Edition, Medical Review Branch, Engineer, site, Division of [employer] of Transportation, July 2004) -she has recent echo, no ECG changes today -she will contact me if syncope recurs  Chronic diastolic heart failure, Mild Pulmonary hypertension in the setting of COPD and chronic hypoxic respiratory failure requiring home O2: -euvolemic on exam -RHC consistent with mild-moderate pulmonary hypertension driven by her 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  -continue management of her underlying lung disease -stable on dosing of furosemide, euvolemic today -on chronic home O2 -on daily lasix, continue  Chest pain: atypical, low risk features. Nonobstructive cad on cath in 2018 -high suspicion that this is MSK/fibromyalgia related given features -no further workup at this time -instructed on red flag symptoms that need immediate  medical attention  Hypertension: well controlled today, below goal of <130/80 -continue amlodipine 2.5 mg daily -if BP drops further, may need to stop amlodipine  Nonobstructive asymptomatic CAD: -continue aspirin 81 mg -on simvastatin 10 mg. Would not increase either simvastatin or amlodipine above FDA guidelines, consider alternative therapy if changes needed -discussed data re: triptans and CAD/MI risk. She understands, wishes to continue current therapy  Tobacco abuse, with cessation counseling: The patient was counseled on tobacco cessation today for 3 minutes.  Counseling included reviewing the risks of smoking tobacco products, how it impacts the patient's current medical diagnoses and different strategies for quitting.  Pharmacotherapy to aid in tobacco cessation was not prescribed today. Counseled hat smoking while on oxygen is life threatening.  CV risk counseling and primary prevention: -recommend heart healthy/Mediterranean diet, with whole grains, fruits, vegetable, fish, lean meats, nuts, and olive oil. Limit salt. -recommend moderate walking, 3-5 times/week for 30-50 minutes each session. Aim for at least 150 minutes.week. Goal should be pace of 3 miles/hours, or walking 1.5 miles in 30 minutes -recommend avoidance of tobacco products. Avoid excess alcohol.  OSA: uses CPAP COPD, with chronic hypoxic respiratory failure on home O2: followed by Dr. Elsworth Soho and the pulmonary clinic  Plan for follow up: 6 mos or sooner PRN  Medication Adjustments/Labs and Tests Ordered: Current medicines are reviewed at length with the patient today.  Concerns regarding medicines are outlined above.  Orders Placed This Encounter  Procedures  . EKG 12-Lead   No orders of the defined types were placed in this encounter.   Patient Instructions  Medication Instructions:  Your Physician recommend you continue on your current medication as directed.    *If you need a refill on your cardiac  medications before your next appointment, please call your pharmacy*   Lab Work: None ordered    Testing/Procedures: None ordered    Follow-Up: At [facility], you and your health needs are our priority.  As part of  our continuing mission to provide you with exceptional heart care, we have created designated Provider Care Teams.  These Care Teams include your primary Cardiologist (physician) and Advanced Practice Providers (APPs -  Physician Assistants and Nurse Practitioners) who all work together to provide you with the care you need, when you need it.  We recommend signing up for the patient portal called "MyChart".  Sign up information is provided on this After Visit Summary.  MyChart is used to connect with patients for Virtual Visits (Telemedicine).  Patients are able to view lab/test results, encounter notes, upcoming appointments, etc.  Non-urgent messages can be sent to your provider as well.   To learn more about what you can do with MyChart, go to [email or web address].    Your next appointment:   6 month(s)  The format for your next appointment:   In Person  Provider:   Buford Dresser, MD     Signed, Teresa Dresser, MD PhD 09/01/2020 9:28 AM    [place]

## 2020-09-01 NOTE — Patient Instructions (Signed)

## 2020-09-03 ENCOUNTER — Other Ambulatory Visit: Payer: Self-pay | Admitting: Rheumatology

## 2020-09-03 NOTE — Telephone Encounter (Signed)
Last visit: 06/23/2020 Next Visit: 12/23/2020  Okay to refill per Dr. Estanislado Pandy

## 2020-09-05 ENCOUNTER — Telehealth: Payer: Self-pay | Admitting: Pulmonary Disease

## 2020-09-05 ENCOUNTER — Telehealth (HOSPITAL_COMMUNITY): Payer: Self-pay

## 2020-09-05 NOTE — Telephone Encounter (Signed)
Called pt and explained possible monoclonal antibody treatment. Sx started 10/20. Tested positive 10/22 at [facility] in [place]. She has a copy of the positive test result. Sx include cough, chills, body aches, and fatigue. Qualifying risk factors COPD and BMI > 25. Pt interested in tx but said she feels too weak to drive to [place]. She asked for a mAb clinic closer to her. I gave her the [facility] hotline [phone] to find a location closer to her.

## 2020-09-05 NOTE — Telephone Encounter (Signed)
Called to Discuss with patient about Covid symptoms and the use of the monoclonal antibody infusion for those with mild to moderate Covid symptoms and at a high risk of hospitalization.     Pt appears to qualify for this infusion due to co-morbid conditions and/or a member of an at-risk group in accordance with the FDA Emergency Use Authorization.    Unable to reach pt, LM message left at 16:33.

## 2020-09-05 NOTE — Telephone Encounter (Signed)
error 

## 2020-09-05 NOTE — Telephone Encounter (Signed)
Primary Pulmonologist:  Teresa Roberts Last office visit and with whom: 08/29/20 Teresa Roberts What do we see them for (pulmonary problems): Centrilobular Emphysema, pulmonary HTN, OSA Last OV assessment/plan:   Assessment & Plan:      Assessment & Plan Note by Rigoberto Noel, MD at 08/29/2020 3:49 PM Author: Rigoberto Noel, MD Author Type: Physician Filed: 08/29/2020 5:06 PM  Note Status: [name] List: Cosign Not Required Encounter Date: 08/29/2020  Problem: Respiratory failure with hypoxia [facility])  Editor: Rigoberto Noel, MD (Physician)      Prior Versions: 1. Rigoberto Noel, MD (Physician) at 08/29/2020 3:49 PM - Written      She requests a new POC.  She currently owns her Inogen POC set at 5 L pulse. It does seem like 5 L pulses not adequate for her.  She needs 5 L continuous -we will send prescription to DME accordingly     Patient Instructions by Rigoberto Noel, MD at 08/29/2020 3:00 PM Author: Rigoberto Noel, MD Author Type: Physician Filed: 08/29/2020 3:52 PM  Note Status: Addendum Mickle Mallory: Cosign Not Required Encounter Date: 08/29/2020  Editor: Rigoberto Noel, MD (Physician)      Prior Versions: 1. Rigoberto Noel, MD (Physician) at 08/29/2020 3:47 PM - Addendum   2. Rigoberto Noel, MD (Physician) at 08/29/2020 3:46 PM - Signed      Saturation at rest, walking on room air and on POC 5 pulse  Refills on trilogy. You have to quit smoking!  Schedule PFTs FLu shot    Assessment & Plan Note by Rigoberto Noel, MD at 08/29/2020 3:49 PM Author: Rigoberto Noel, MD Author Type: Physician Filed: 08/29/2020 3:49 PM  Note Status: Written Cosign: Cosign Not Required Encounter Date: 08/29/2020  Problem: OSA (obstructive sleep apnea)  Editor: Rigoberto Noel, MD (Physician)                 Compliant with CPAP by history. We will check download    Assessment & Plan Note by Rigoberto Noel, MD at 08/29/2020 3:48 PM Author: Rigoberto Noel, MD Author Type: Physician Filed: 08/29/2020 3:49  PM  Note Status: Written Cosign: Cosign Not Required Encounter Date: 08/29/2020  Problem: Pulmonary hypertension ([place])  Editor: Rigoberto Noel, MD (Physician)                 This appears to be secondary to diastolic dysfunction and lung disease     Assessment & Plan Note by Rigoberto Noel, MD at 08/29/2020 3:47 PM Author: Rigoberto Noel, MD Author Type: Physician Filed: 08/29/2020 3:48 PM  Note Status: Written Cosign: Cosign Not Required Encounter Date: 08/29/2020  Problem: COPD (chronic obstructive pulmonary disease) ([place])  Editor: Rigoberto Noel, MD (Physician)                 Continue Trelegy for now Albuterol for rescue  Schedule PFTs to reassess. Tobacco cessation most important intervention here.  She has tried nicotine patches and Nicotrol inhaler it does not seem to be motivated to quit.  She was not willing to commit to a quit attempt    Instructions    Return in about 4 months (around 12/30/2020) for TP / BW. Saturation at rest, walking on room air and on POC 5 pulse  Refills on trilogy. You have to quit smoking!  Schedule PFTs FLu shot       Was appointment offered to patient (explain)?  No, Covid + as of today, 09/05/2020;  she is currently in [place], went to [facility] for covid test.   Reason for call: wanting antibiotics, tested positive for covid today, has had s/s since 09/03/20, her sob is at baseline, she is coughing, has lost her sense of taste, has sense of smell.  She did receive both of her covid vaccines prior to + test.  Called patient back and gave her the # for the infusion clinic, [phone] to call.  (examples of things to ask: : When did symptoms start? Fever? Cough? Productive? Color to sputum? More sputum than usual? Wheezing? Have you needed increased oxygen? Are you taking your respiratory medications? What over the counter measures have you tried?)  Allergies  Allergen Reactions  . Citrus Hives  . Lisinopril Cough     Immunization History  Administered Date(s) Administered  . Influenza Split 08/15/2017, 08/24/2019  . Influenza,inj,Quad PF,6+ Mos 09/04/2018, 08/29/2020  . Influenza-Unspecified 08/15/2016  . Moderna SARS-COVID-2 Vaccination 07/11/2020  . PFIZER SARS-COV-2 Vaccination 05/22/2020  . Pneumococcal-Unspecified 02/13/2013

## 2020-09-08 DIAGNOSIS — U071 COVID-19: Secondary | ICD-10-CM | POA: Diagnosis not present

## 2020-09-09 ENCOUNTER — Ambulatory Visit (HOSPITAL_COMMUNITY)
Admission: RE | Admit: 2020-09-09 | Discharge: 2020-09-09 | Disposition: A | Discharge status: Home / Self Care | Payer: 59 | Source: Ambulatory Visit | Attending: Pulmonary Disease | Admitting: Pulmonary Disease

## 2020-09-09 ENCOUNTER — Other Ambulatory Visit: Payer: Self-pay | Admitting: Nurse Practitioner

## 2020-09-09 DIAGNOSIS — R0902 Hypoxemia: Secondary | ICD-10-CM

## 2020-09-09 DIAGNOSIS — E785 Hyperlipidemia, unspecified: Secondary | ICD-10-CM

## 2020-09-09 DIAGNOSIS — J432 Centrilobular emphysema: Secondary | ICD-10-CM

## 2020-09-09 DIAGNOSIS — Z720 Tobacco use: Secondary | ICD-10-CM

## 2020-09-09 DIAGNOSIS — I5032 Chronic diastolic (congestive) heart failure: Secondary | ICD-10-CM

## 2020-09-09 DIAGNOSIS — U071 COVID-19: Secondary | ICD-10-CM

## 2020-09-09 DIAGNOSIS — I10 Essential (primary) hypertension: Secondary | ICD-10-CM

## 2020-09-09 DIAGNOSIS — G4733 Obstructive sleep apnea (adult) (pediatric): Secondary | ICD-10-CM

## 2020-09-09 DIAGNOSIS — J9611 Chronic respiratory failure with hypoxia: Secondary | ICD-10-CM

## 2020-09-09 DIAGNOSIS — I2510 Atherosclerotic heart disease of native coronary artery without angina pectoris: Secondary | ICD-10-CM

## 2020-09-09 DIAGNOSIS — Z9981 Dependence on supplemental oxygen: Secondary | ICD-10-CM | POA: Diagnosis present

## 2020-09-09 DIAGNOSIS — I272 Pulmonary hypertension, unspecified: Secondary | ICD-10-CM

## 2020-09-09 MED ORDER — SODIUM CHLORIDE 0.9 % IV SOLN: INTRAVENOUS | Status: DC | PRN

## 2020-09-09 MED ORDER — DIPHENHYDRAMINE HCL 50 MG/ML IJ SOLN: 50.0000 mg | Freq: Once | INTRAMUSCULAR | Status: DC | PRN

## 2020-09-09 MED ORDER — FAMOTIDINE IN NACL 20-0.9 MG/50ML-% IV SOLN: 20.0000 mg | Freq: Once | INTRAVENOUS | Status: DC | PRN

## 2020-09-09 MED ORDER — EPINEPHRINE 0.3 MG/0.3ML IJ SOAJ: 0.3000 mg | Freq: Once | INTRAMUSCULAR | Status: DC | PRN

## 2020-09-09 MED ORDER — ACETAMINOPHEN 325 MG PO TABS
650.0000 mg | ORAL_TABLET | Freq: Once | ORAL | Status: AC
  Administered 2020-09-09: 650 mg via ORAL
  Filled 2020-09-09: qty 2

## 2020-09-09 MED ORDER — ALBUTEROL SULFATE HFA 108 (90 BASE) MCG/ACT IN AERS: 2.0000 | INHALATION_SPRAY | Freq: Once | RESPIRATORY_TRACT | Status: DC | PRN

## 2020-09-09 MED ORDER — METHYLPREDNISOLONE SODIUM SUCC 125 MG IJ SOLR: 125.0000 mg | Freq: Once | INTRAMUSCULAR | Status: DC | PRN

## 2020-09-09 MED ORDER — SODIUM CHLORIDE 0.9 % IV SOLN: Freq: Once | INTRAVENOUS | Status: AC

## 2020-09-09 NOTE — Progress Notes (Signed)
Patient presented to the [facility] for treatment for COVID-19. It appears the patient had been pre-screened for treatment, but was not scheduled due to patient wanting to try to find a location closer to her home.   Upon evaluation patient reported home O2 requirements of 5L [place] continuous at baseline which has been verified with chart review. She reports that she has been going up to 6L [place] at home on occasion due to feeling short of breath. Discussed with the patient that increased oxygen needs due to COVID-19 would disqualify her for this treatment, but we could transfer her to the emergency room to evaluate for remdesivir.   Given that the patient has not been consistently monitoring oxygen saturations and increasing oxygen by 1L based on feelings of shortness of breath, a joint decision was made to monitor oxygen saturations in the [facility] infusion clinic today at her [place] baseline.  10 minutes of continuous monitoring in the clinic today show oxygen saturations between 96-98% on 5L [place]. She is not requiring any additional therapy at this time and her saturations are stable. Discussed with patient the risks and benefits of infusion. She would like to proceed with infusion therapy today.   I feel this is reasonable given her presentation and vital signs in the clinic today.   Recommend continued home monitoring of oxygen saturations with reliable monitor and to notify pulmonology immediately if her oxygen requirements begin to increase due to dropping oxygen saturations.

## 2020-09-09 NOTE — Discharge Instructions (Signed)

## 2020-09-09 NOTE — Progress Notes (Signed)
°  Diagnosis: COVID-19  Physician:  Asencion Noble  Procedure: Covid Infusion Clinic Med: bamlanivimab\etesevimab infusion - Provided patient with bamlanimivab\etesevimab fact sheet for patients, parents and caregivers prior to infusion.  Complications: No immediate complications noted.  Discharge: Discharged home   Dorene Sorrow 09/09/2020

## 2020-09-09 NOTE — Progress Notes (Signed)
I connected by phone with Teresa Roberts on 09/09/2020 at 11:09 AM to discuss the potential use of a new treatment for mild to moderate COVID-19 viral infection in non-hospitalized patients.  This patient is a 59 y.o. female that meets the FDA criteria for Emergency Use Authorization of COVID monoclonal antibody casirivimab/imdevimab or bamlanivimab/eteseviamb.  Has a (+) direct SARS-CoV-2 viral test result  Has mild or moderate COVID-19   Is NOT hospitalized due to COVID-19  Is within 10 days of symptom onset  Has at least one of the high risk factor(s) for progression to severe COVID-19 and/or hospitalization as defined in EUA.  Specific high risk criteria : Cardiovascular disease or hypertension, Chronic Lung Disease and Medical-related technological dependence- home O2 dependent at 5L continuous Sx onset 10/20  I have spoken and communicated the following to the patient or parent/caregiver regarding COVID monoclonal antibody treatment:  1. FDA has authorized the emergency use for the treatment of mild to moderate COVID-19 in adults and pediatric patients with positive results of direct SARS-CoV-2 viral testing who are 2 years of age and older weighing at least 40 kg, and who are at high risk for progressing to severe COVID-19 and/or hospitalization.  2. The significant known and potential risks and benefits of COVID monoclonal antibody, and the extent to which such potential risks and benefits are unknown.  3. Information on available alternative treatments and the risks and benefits of those alternatives, including clinical trials.  4. Patients treated with COVID monoclonal antibody should continue to self-isolate and use infection control measures (e.g., wear mask, isolate, social distance, avoid sharing personal items, clean and disinfect "high touch" surfaces, and frequent handwashing) according to CDC guidelines.   5. The patient or parent/caregiver has the option to accept or refuse  COVID monoclonal antibody treatment.  After reviewing this information with the patient, the patient has agreed to receive one of the available covid 19 monoclonal antibodies and will be provided an appropriate fact sheet prior to infusion. [name], NP 09/09/2020 11:09 AM

## 2020-09-13 ENCOUNTER — Other Ambulatory Visit: Payer: Self-pay | Admitting: Rheumatology

## 2020-09-15 NOTE — Telephone Encounter (Signed)
Please asked patient to reduce Skelaxin to 1 in the morning so that they can continue Zanaflex at bedtime.

## 2020-09-15 NOTE — Telephone Encounter (Signed)
Patient advised to reduce Skelaxin to 1 in the morning so that they can continue Zanaflex at bedtime. Patient is in agreement.

## 2020-09-15 NOTE — Telephone Encounter (Signed)
Last visit: 06/23/2020 Next Visit: 12/23/2020  Last Fill: 08/18/2020  Okay to refill Tizanidine?

## 2020-09-23 ENCOUNTER — Encounter: Payer: Self-pay | Admitting: Endocrinology

## 2020-09-23 ENCOUNTER — Ambulatory Visit (INDEPENDENT_AMBULATORY_CARE_PROVIDER_SITE_OTHER): Payer: 59 | Admitting: Endocrinology

## 2020-09-23 ENCOUNTER — Other Ambulatory Visit: Payer: Self-pay

## 2020-09-23 VITALS — BP 134/82 | HR 64 | Ht 66.0 in | Wt 168.0 lb

## 2020-09-23 DIAGNOSIS — E210 Primary hyperparathyroidism: Secondary | ICD-10-CM

## 2020-09-23 DIAGNOSIS — I2510 Atherosclerotic heart disease of native coronary artery without angina pectoris: Secondary | ICD-10-CM

## 2020-09-23 NOTE — Progress Notes (Signed)
Subjective:    Patient ID: Teresa Roberts, female    DOB: 01-02-1961, 58 y.o.   MRN: [id number]  HPI Pt returns for f/u of hypercalcemia (dx'ed 2019; PTH was high, but both improved with vit-D supplementation; she had had these bony fractures: left ankle and left elbow (both with injuries, and both in 2019).  She takes Vit-D, 1000 units/d.   Past Medical History:  Diagnosis Date  . CAD (coronary artery disease)    a. 01/2017: cath showing 20% mid RCA stenosis and otherwise normal LAD and LCx with a preserved EF of 55 to 60%; mod pulmonary HTN  . COPD (chronic obstructive pulmonary disease) ([place])    O2 dependent  . cpap   . Depression    PTSD  . Diastolic CHF ([place])   . Fibromyalgia   . Hyperlipidemia   . Hypertension   . Migraines     Past Surgical History:  Procedure Laterality Date  . APPENDECTOMY    . CESAREAN SECTION    . ORIF ANKLE FRACTURE Left 08/12/2017   Procedure: OPEN REDUCTION INTERNAL FIXATION (ORIF) ANKLE FRACTURE;  Surgeon: Rod Can, MD;  Location: [facility];  Service: Orthopedics;  Laterality: Left;  . RIGHT HEART CATH N/A 07/19/2018   Procedure: RIGHT HEART CATH;  Surgeon: Jolaine Artist, MD;  Location: [facility];  Service: Cardiovascular;  Laterality: N/A;  . RIGHT/LEFT HEART CATH AND CORONARY ANGIOGRAPHY N/A 01/27/2017   Procedure: Right/Left Heart Cath and Coronary Angiography;  Surgeon: Troy Sine, MD;  Location: [facility];  Service: Cardiovascular;  Laterality: N/A;    Social History   Socioeconomic History  . Marital status: Married    Spouse name: Not on file  . Number of children: Not on file  . Years of education: Not on file  . Highest education level: Not on file  Occupational History  . Not on file  Tobacco Use  . Smoking status: Current Every Day Smoker    Packs/day: 0.50    Years: 39.00    Pack years: 19.50    Types: Cigarettes    Start date: 07/21/2018  . Smokeless tobacco: Never Used  Vaping Use  . Vaping Use:  Never used  Substance and Sexual Activity  . Alcohol use: No  . Drug use: Yes    Types: Marijuana    Comment: occ  . Sexual activity: Not on file  Other Topics Concern  . Not on file  Social History Narrative  . Not on file   Social Determinants of Health   Financial Resource Strain:   . Difficulty of Paying Living Expenses: Not on file  Food Insecurity:   . Worried About Charity fundraiser in the Last Year: Not on file  . Ran Out of Food in the Last Year: Not on file  Transportation Needs:   . Lack of Transportation (Medical): Not on file  . Lack of Transportation (Non-Medical): Not on file  Physical Activity:   . Days of Exercise per Week: Not on file  . Minutes of Exercise per Session: Not on file  Stress:   . Feeling of Stress : Not on file  Social Connections:   . Frequency of Communication with Friends and Family: Not on file  . Frequency of Social Gatherings with Friends and Family: Not on file  . Attends Religious Services: Not on file  . Active Member of Clubs or Organizations: Not on file  . Attends Archivist Meetings: Not on  file  . Marital Status: Not on file  Intimate Partner Violence:   . Fear of Current or Ex-Partner: Not on file  . Emotionally Abused: Not on file  . Physically Abused: Not on file  . Sexually Abused: Not on file    Current Outpatient Medications on File Prior to Visit  Medication Sig Dispense Refill  . albuterol (PROVENTIL) (2.5 MG/3ML) 0.083% nebulizer solution Take 3 mLs (2.5 mg total) by nebulization every 6 (six) hours as needed for wheezing or shortness of breath. 360 mL 5  . albuterol (VENTOLIN HFA) 108 (90 Base) MCG/ACT inhaler Inhale 2 puffs into the lungs every 6 (six) hours as needed for wheezing or shortness of breath. 18 g 5  . amLODipine (NORVASC) 2.5 MG tablet Take 2.5 mg by mouth daily.  0  . aspirin 81 MG EC tablet Take 81 mg by mouth daily after lunch.  0  . busPIRone (BUSPAR) 10 MG tablet Take 10 mg by mouth  2 (two) times daily.    . dorzolamide-timolol (COSOPT) 22.3-6.8 MG/ML ophthalmic solution Place 1 drop into both eyes 2 (two) times daily.    . DULoxetine (CYMBALTA) 30 MG capsule TAKE 1 CAPSULE BY MOUTH EVERYDAY AT BEDTIME 30 capsule 2  . Fluticasone-Umeclidin-Vilant (TRELEGY ELLIPTA) 100-62.5-25 MCG/INH AEPB Inhale 1 puff into the lungs daily. 60 each 5  . folic acid (FOLVITE) 1 MG tablet Take 1 mg by mouth daily after lunch.  0  . gabapentin (NEURONTIN) 800 MG tablet Take 800 mg by mouth 2 (two) times daily.    Marland Kitchen latanoprost (XALATAN) 0.005 % ophthalmic solution Place 1 drop into both eyes at bedtime.    . metaxalone (SKELAXIN) 400 MG tablet Take 1 tablet by mouth in the morning and 1 tablet by mouth at noon as need for muscle spasms. 60 tablet 0  . modafinil (PROVIGIL) 100 MG tablet Take 100 mg by mouth daily.  4  . Multiple Vitamins-Minerals (HAIR/SKIN/NAILS/BIOTIN) TABS Take 1 tablet by mouth daily after lunch.     Marland Kitchen omeprazole (PRILOSEC) 20 MG capsule Take by mouth daily.   4  . OXYGEN Inhale 8 L into the lungs continuous.     . rizatriptan (MAXALT-MLT) 10 MG disintegrating tablet Take 10 mg by mouth as needed for migraine. May repeat in 2 hours if needed    . SAXENDA 18 MG/3ML SOPN 0.6 MG SQ ONCE A DAY X1 WEEK, THEN INCREASE DOSE BY 0.6 MG/DAY EVERY WEEK (MAX  1.8 MG/DAY) DAILY    . simvastatin (ZOCOR) 10 MG tablet Take 10 mg by mouth at bedtime.    Marland Kitchen tiZANidine (ZANAFLEX) 4 MG tablet TAKE 1 TABLET (4 MG TOTAL) BY MOUTH AT BEDTIME AS NEEDED FOR MUSCLE SPASMS. 30 tablet 0  . topiramate (TOPAMAX) 25 MG tablet Take 50 mg by mouth as needed.     . traZODone (DESYREL) 100 MG tablet Take 100 mg by mouth at bedtime.     . vitamin B-12 (CYANOCOBALAMIN) 1000 MCG tablet Take 1,000 mcg by mouth daily after lunch.    . furosemide (LASIX) 40 MG tablet Take 1 tablet (40 mg total) by mouth daily. 30 tablet 11   No current facility-administered medications on file prior to visit.    Allergies    Allergen Reactions  . Citrus Hives  . Lisinopril Cough    Family History  Problem Relation Age of Onset  . Diabetes Mother   . Hypertension Mother   . CAD Mother   . Diabetes Father   .  Lung cancer Father   . Diabetes Sister   . Hypercalcemia Neg Hx     BP 134/82   Pulse 64   Ht 5\' 6"  (1.676 m)   Wt 168 lb (76.2 kg)   LMP 08/19/2012   SpO2 96%   BMI 27.12 kg/m     Review of Systems Denies muscle cramps and numbness    Objective:   Physical Exam VITAL SIGNS:  See vs page GENERAL: no distress.  Has 02 on. Chest wall: no kyphosis.     Lab Results  Component Value Date   PTH 113 (H) 09/23/2020   CALCIUM 10.3 09/23/2020   PHOS 3.3 08/09/2017   25-OH Vit-D=36    Assessment & Plan:  Vit-D def: well-replaced.  Hyperparathyroidism: intermitt: we'll follow Hypercalcemia: better now, but we'll check labs today, to eval.   Patient Instructions  Please call [phone] to schedule a Bone Density (DexaScan) a the [facility] office at [place].   Blood tests are requested for you today.  We'll let you know about the results.  Please come back for a follow-up appointment in 3-4 months.

## 2020-09-23 NOTE — Patient Instructions (Addendum)
Please call [phone] to schedule a Bone Density (DexaScan) a the [facility] office at [place].   Blood tests are requested for you today.  We'll let you know about the results.  Please come back for a follow-up appointment in 3-4 months.

## 2020-09-24 LAB — VITAMIN D 25 HYDROXY (VIT D DEFICIENCY, FRACTURES): VITD: 36.1 ng/mL (ref 30.00–100.00)

## 2020-09-28 LAB — PTH, INTACT AND CALCIUM
Calcium: 10.3 mg/dL (ref 8.6–10.4)
PTH: 113 pg/mL — ABNORMAL HIGH (ref 14–64)

## 2020-09-28 LAB — VITAMIN D 1,25 DIHYDROXY
Vitamin D 1, 25 (OH)2 Total: 65 pg/mL (ref 18–72)
Vitamin D2 1, 25 (OH)2: <8 pg/mL
Vitamin D3 1, 25 (OH)2: 65 pg/mL

## 2020-09-28 LAB — VITAMIN A: Vitamin A (Retinoic Acid): 44 ug/dL (ref 38–98)

## 2020-09-28 LAB — PTH-RELATED PEPTIDE: PTH-Related Protein (PTH-RP): 9 pg/mL — ABNORMAL LOW (ref 11–20)

## 2020-09-29 ENCOUNTER — Other Ambulatory Visit: Payer: Self-pay | Admitting: Rheumatology

## 2020-10-24 DIAGNOSIS — Z86010 Personal history of colonic polyps: Secondary | ICD-10-CM | POA: Diagnosis not present

## 2020-10-24 DIAGNOSIS — K219 Gastro-esophageal reflux disease without esophagitis: Secondary | ICD-10-CM | POA: Diagnosis not present

## 2020-10-28 ENCOUNTER — Other Ambulatory Visit: Payer: Self-pay | Admitting: Gastroenterology

## 2020-11-26 DIAGNOSIS — J9691 Respiratory failure, unspecified with hypoxia: Secondary | ICD-10-CM | POA: Diagnosis not present

## 2020-11-26 DIAGNOSIS — F3341 Major depressive disorder, recurrent, in partial remission: Secondary | ICD-10-CM | POA: Diagnosis not present

## 2020-11-26 DIAGNOSIS — G43909 Migraine, unspecified, not intractable, without status migrainosus: Secondary | ICD-10-CM | POA: Diagnosis not present

## 2020-11-26 DIAGNOSIS — L732 Hidradenitis suppurativa: Secondary | ICD-10-CM | POA: Diagnosis not present

## 2020-11-28 ENCOUNTER — Telehealth: Payer: Self-pay | Admitting: Pulmonary Disease

## 2020-11-28 DIAGNOSIS — J432 Centrilobular emphysema: Secondary | ICD-10-CM

## 2020-11-28 MED ORDER — TRELEGY ELLIPTA 100-62.5-25 MCG/INH IN AEPB: 1.0000 | INHALATION_SPRAY | Freq: Every day | RESPIRATORY_TRACT | 5 refills | Status: DC

## 2020-11-28 NOTE — Telephone Encounter (Signed)
11/28/2020  Called and spoke with patient. Refilled patient is Trelegy Ellipta as indicated from October/2021 office visit with Dr. Elsworth Soho. While on the phone patient requested follow-up in our office to qualify for a [facility] POC.  Patient scheduled with SG NP on 12/03/2020 at 4:30 PM. Encourage patient to arrive no later than 4:15 PM for this appointment so that way she can be walked.  Nothing further needed  Wyn Quaker, FNP

## 2020-12-03 ENCOUNTER — Other Ambulatory Visit: Payer: Self-pay

## 2020-12-03 ENCOUNTER — Ambulatory Visit (INDEPENDENT_AMBULATORY_CARE_PROVIDER_SITE_OTHER): Payer: 59 | Admitting: Acute Care

## 2020-12-03 ENCOUNTER — Encounter: Payer: Self-pay | Admitting: Acute Care

## 2020-12-03 VITALS — BP 130/70 | HR 86 | Temp 97.8°F | Ht 66.0 in | Wt 174.6 lb

## 2020-12-03 DIAGNOSIS — F17210 Nicotine dependence, cigarettes, uncomplicated: Secondary | ICD-10-CM | POA: Diagnosis not present

## 2020-12-03 DIAGNOSIS — I272 Pulmonary hypertension, unspecified: Secondary | ICD-10-CM

## 2020-12-03 DIAGNOSIS — R0902 Hypoxemia: Secondary | ICD-10-CM | POA: Diagnosis not present

## 2020-12-03 DIAGNOSIS — J9611 Chronic respiratory failure with hypoxia: Secondary | ICD-10-CM | POA: Diagnosis not present

## 2020-12-03 DIAGNOSIS — J432 Centrilobular emphysema: Secondary | ICD-10-CM | POA: Diagnosis not present

## 2020-12-03 MED ORDER — TRELEGY ELLIPTA 100-62.5-25 MCG/INH IN AEPB: 1.0000 | INHALATION_SPRAY | Freq: Every day | RESPIRATORY_TRACT | 5 refills | Status: DC

## 2020-12-03 MED ORDER — TRELEGY ELLIPTA 100-62.5-25 MCG/INH IN AEPB: 2.0000 | INHALATION_SPRAY | Freq: Every day | RESPIRATORY_TRACT | 0 refills | Status: DC

## 2020-12-03 NOTE — Progress Notes (Signed)
History of Present Illness Teresa Roberts is a 60 y.o. female heavy smoker with COPD, pulmonary hypertension and chronic respiratory failure with hypoxia. Hypoxia quite out of proportion to degree of airway obstruction or pulmonary hypertension -She uses 5 L pulse on exertion - She smoked>30-pack-years, she does not participate in lung cancer screening.   12/03/2020  Pt. Presents for follow up. She is here for a walk to qualify for POC. She maintained a sat of 90% on 5 L after 2 laps. The order has been placed.  She states her COPD has been stable. She has been out of Trelegy for 4 days, and she can really tell a difference when she does not use it. . She has minimal cough or secretions. We have provided free samples of Trelegy for her, as well as a card for a free month. She understands the order for POC has been placed.  She is having a colonoscopy 12/01/2020. PFT's ordered 08/2020 have not been done. We will schedule them today. She denies fever, chest pain, orthopnea or hemoptysis.   Test Results: 03/26/2020 Left Ventricle: Left ventricular ejection fraction, by estimation, is 55  to 60%. The left ventricle has normal function. The left ventricle has no  regional wall motion abnormalities. The left ventricular internal cavity  size was normal in size. There is  no left ventricular hypertrophy. Left ventricular diastolic parameters  are consistent with Grade II diastolic dysfunction (pseudonormalization).  Indeterminate filling pressures.   Right Ventricle: The right ventricular size is normal. No increase in  right ventricular wall thickness. Right ventricular systolic function is  normal.   Left Atrium: Left atrial size was normal in size.   Right Atrium: Right atrial size was normal in size.   Pericardium: There is no evidence of pericardial effusion.   Mitral Valve: The mitral valve is grossly normal. Trivial mitral valve  regurgitation.   Tricuspid Valve: The tricuspid  valve is grossly normal. Tricuspid valve  regurgitation is trivial.   Aortic Valve: The aortic valve is tricuspid. Aortic valve regurgitation is  not visualized.   Pulmonic Valve: The pulmonic valve was normal in structure. Pulmonic valve  regurgitation is not visualized.   Aorta: The aortic root and ascending aorta are structurally normal, with  no evidence of dilitation.   Venous: The inferior vena cava is normal in size with less than 50%  respiratory variability, suggesting right atrial pressure of 8 mmHg.   IAS/Shunts: No atrial level shunt detected by color flow Doppler.    CBC Latest Ref Rng & Units 08/27/2019 06/27/2019 09/23/2018  WBC 4.0 - 10.5 K/uL 5.2 6.3 8.0  Hemoglobin 12.0 - 15.0 g/dL 13.3 11.0(L) 10.0(L)  Hematocrit 36.0 - 46.0 % 42.4 37.9 38.3  Platelets 150 - 400 K/uL 164 258 247    BMP Latest Ref Rng & Units 09/23/2020 08/25/2020 08/27/2019  Glucose 70 - 99 mg/dL - - 97  BUN 6 - 20 mg/dL - - 12  Creatinine 0.44 - 1.00 mg/dL - - 1.04(H)  BUN/Creat Ratio 9 - 23 - - -  Sodium 135 - 145 mmol/L - - 142  Potassium 3.5 - 5.1 mmol/L - - 2.9(LL)  Chloride 98 - 111 mmol/L - - 100  CO2 22 - 32 mmol/L - - 34(H)  Calcium 8.6 - 10.4 mg/dL 10.3 10.9(H) 11.1(H)    BNP    Component Value Date/Time   BNP 510.0 (H) 05/27/2018 2347    ProBNP No results found for: PROBNP  PFT No results  found for: FEV1PRE, FEV1POST, FVCPRE, FVCPOST, TLC, DLCOUNC, PREFEV1FVCRT, PSTFEV1FVCRT  No results found.   Past medical hx Past Medical History:  Diagnosis Date  . CAD (coronary artery disease)    a. 01/2017: cath showing 20% mid RCA stenosis and otherwise normal LAD and LCx with a preserved EF of 55 to 60%; mod pulmonary HTN  . COPD (chronic obstructive pulmonary disease) ([place])    O2 dependent  . cpap   . Depression    PTSD  . Diastolic CHF ([place])   . Fibromyalgia   . Hyperlipidemia   . Hypertension   . Migraines      Social History   Tobacco Use  . Smoking status:  Current Every Day Smoker    Packs/day: 0.50    Years: 39.00    Pack years: 19.50    Types: Cigarettes    Start date: 07/21/2018  . Smokeless tobacco: Never Used  Vaping Use  . Vaping Use: Never used  Substance Use Topics  . Alcohol use: No  . Drug use: Yes    Types: Marijuana    Comment: occ      Tobacco Cessation: Current every day smoker with a 39 pack year smoking history. Counseled on quitting smoking. Counseled not to smoke while using oxygen.  Past surgical hx, Family hx, Social hx all reviewed.  Current Outpatient Medications on File Prior to Visit  Medication Sig  . albuterol (PROVENTIL) (2.5 MG/3ML) 0.083% nebulizer solution Take 3 mLs (2.5 mg total) by nebulization every 6 (six) hours as needed for wheezing or shortness of breath.  Marland Kitchen albuterol (VENTOLIN HFA) 108 (90 Base) MCG/ACT inhaler Inhale 2 puffs into the lungs every 6 (six) hours as needed for wheezing or shortness of breath.  Marland Kitchen amLODipine (NORVASC) 2.5 MG tablet Take 2.5 mg by mouth daily.  Marland Kitchen aspirin 81 MG EC tablet Take 81 mg by mouth daily after lunch.  . busPIRone (BUSPAR) 10 MG tablet Take 10 mg by mouth 2 (two) times daily.  . dorzolamide-timolol (COSOPT) 22.3-6.8 MG/ML ophthalmic solution Place 1 drop into both eyes 2 (two) times daily.  . DULoxetine (CYMBALTA) 30 MG capsule TAKE 1 CAPSULE BY MOUTH EVERYDAY AT BEDTIME  . folic acid (FOLVITE) 1 MG tablet Take 1 mg by mouth daily after lunch.  . gabapentin (NEURONTIN) 800 MG tablet Take 800 mg by mouth 2 (two) times daily.  Marland Kitchen latanoprost (XALATAN) 0.005 % ophthalmic solution Place 1 drop into both eyes at bedtime.  . metaxalone (SKELAXIN) 400 MG tablet Take 1 tablet by mouth in the morning and 1 tablet by mouth at noon as need for muscle spasms.  . modafinil (PROVIGIL) 100 MG tablet Take 100 mg by mouth daily.  . Multiple Vitamins-Minerals (HAIR/SKIN/NAILS/BIOTIN) TABS Take 1 tablet by mouth daily after lunch.   Marland Kitchen omeprazole (PRILOSEC) 20 MG capsule Take by  mouth daily.   . OXYGEN Inhale 8 L into the lungs continuous.   . rizatriptan (MAXALT-MLT) 10 MG disintegrating tablet Take 10 mg by mouth as needed for migraine. May repeat in 2 hours if needed  . SAXENDA 18 MG/3ML SOPN 0.6 MG SQ ONCE A DAY X1 WEEK, THEN INCREASE DOSE BY 0.6 MG/DAY EVERY WEEK (MAX  1.8 MG/DAY) DAILY  . simvastatin (ZOCOR) 10 MG tablet Take 10 mg by mouth at bedtime.  Marland Kitchen tiZANidine (ZANAFLEX) 4 MG tablet TAKE 1 TABLET (4 MG TOTAL) BY MOUTH AT BEDTIME AS NEEDED FOR MUSCLE SPASMS.  Marland Kitchen topiramate (TOPAMAX) 25 MG tablet Take 50 mg by  mouth as needed.   . traZODone (DESYREL) 100 MG tablet Take 100 mg by mouth at bedtime.  . vitamin B-12 (CYANOCOBALAMIN) 1000 MCG tablet Take 1,000 mcg by mouth daily after lunch.  . furosemide (LASIX) 40 MG tablet Take 1 tablet (40 mg total) by mouth daily.   No current facility-administered medications on file prior to visit.     Allergies  Allergen Reactions  . Citrus Hives  . Lisinopril Cough    Review Of Systems:  Constitutional:   No  weight loss, night sweats,  Fevers, chills,+  fatigue, or  lassitude.  HEENT:   No headaches,  Difficulty swallowing,  Tooth/dental problems, or  Sore throat,                No sneezing, itching, ear ache, nasal congestion, post nasal drip,   CV:  No chest pain,  Orthopnea, PND, swelling in lower extremities, anasarca, dizziness, palpitations, syncope.   GI  No heartburn, indigestion, abdominal pain, nausea, vomiting, diarrhea, change in bowel habits, loss of appetite, bloody stools.   Resp: + shortness of breath with exertion or at rest.  No excess mucus, no productive cough,  No non-productive cough,  No coughing up of blood.  No change in color of mucus.  No wheezing.  No chest wall deformity  Skin: no rash or lesions.  GU: no dysuria, change in color of urine, no urgency or frequency.  No flank pain, no hematuria   MS:  No joint pain or swelling.  No decreased range of motion.  No back  pain.  Psych:  No change in mood or affect. No depression or anxiety.  No memory loss.   Vital Signs BP 130/70 (BP Location: Left Arm, Cuff Size: Normal)   Pulse 86   Temp 97.8 F (36.6 C) (Oral)   Ht 5\' 6"  (1.676 m)   Wt 174 lb 9.6 oz (79.2 kg)   LMP 08/19/2012   SpO2 100%   BMI 28.18 kg/m    Physical Exam:  General- No distress,  A&Ox3, pleasant ENT: No sinus tenderness, TM clear, pale nasal mucosa, no oral exudate,no post nasal drip, no LAN Cardiac: S1, S2, regular rate and rhythm, no murmur Chest: No wheeze/ rales/ dullness; no accessory muscle use, no nasal flaring, no sternal retractions, prolonged expiratory phase Abd.: Soft Non-tender, HD, NT, BS + Ext: No clubbing cyanosis, edema Neuro:  normal strength, MAE x 4, A&O x 3 Skin: No rashes, No lesions , warm and dry Psych: normal mood and behavior   Assessment/Plan COPD, with  chronic respiratory failure with hypoxia. Plan We will place the order for POC as you have done the qualifying walk.  We will give you some free samples of Trelegy  Take 1 puff once daily Rinse mouth after use. Continue using rescue inhaler as needed for breakthrough shortness of breath or wheezing. I have also given you a free card for a month of Trelegy.  Work on quitting smoking. Wear your oxygen at 5 L [place] Saturation goals are > 88% at all times Note your daily symptoms > remember "red flags" for COPD:  Increase in cough, increase in sputum production, increase in shortness of breath or activity intolerance. If you notice these symptoms, please call to be seen.   We will schedule you for the PFT's Dr. Elsworth Soho ordered 08/2020 which were never done.  Follow up with Dr. Elsworth Soho or Judson Roch NP  after PFT's  Please contact office for sooner follow up if symptoms  do not improve or worsen or seek emergency care  Pulmonary hypertension Plan  Continue Lasix 40 mg daily Wear oxygen at 4 L [place] Saturation goal > 88% at all times  Tobacco  Abuse Plan Counseled on quitting smoking completely. Consider lung cancer screening        Magdalen Spatz, NP 12/03/2020  5:11 PM

## 2020-12-03 NOTE — Patient Instructions (Addendum)
It is good to see you today. We will place the order for POC as you have done the qualifying walk.  We will give you some free samples of Trelegy  Take 1 puff once daily Rinse mouth after use. I have also given you a free card for a month of Trelegy.  Work on quitting smoking. We will schedule you for the PFT's Dr. Elsworth Soho ordered 08/2020 which were never done. Note your daily symptoms > remember "red flags" for COPD:  Increase in cough, increase in sputum production, increase in shortness of breath or activity intolerance. If you notice these symptoms, please call to be seen.    Follow up with Dr. Elsworth Soho or Judson Roch NP  after PFT's  Please contact office for sooner follow up if symptoms do not improve or worsen or seek emergency care

## 2020-12-09 NOTE — Progress Notes (Signed)
Office Visit Note  Patient: Teresa Roberts             Date of Birth: October 12, 1961           MRN: [id number]             PCP: Jonathon Jordan, MD Referring: Jonathon Jordan, MD Visit Date: 12/23/2020 Occupation: @GUAROCC @  Subjective:  Trapezius muscle tenderness bilaterally   History of Present Illness: Teresa Roberts is a 60 y.o. female with history of fibromyalgia and osteoarthritis.  Patient reports that she has been in constant pain with fibromyalgia.  She has been having frequent and severe flares.  She ran out of the prescription for Skelaxin and tizanidine and has been experiencing increased pain since then.  She continues to take Cymbalta 30 mg 1 capsule by mouth daily.  She continues to have persistent fatigue secondary to insomnia.  She has been sleeping about 5 hours per night despite taking trazodone 1 mg at bedtime.  She has been experiencing discomfort due to trochanter bursitis over both hips.  Her discomfort is most severe on the left side despite sleeping on her right side.  She states her left knee joint pain has improved after the cortisone injection on 12/18/2019.  She denies any joint swelling at this time     Activities of Daily Living:  Patient reports morning stiffness for 2-3 hours.   Patient Reports nocturnal pain.  Difficulty dressing/grooming: Denies Difficulty climbing stairs: Reports Difficulty getting out of chair: Reports Difficulty using hands for taps, buttons, cutlery, and/or writing: Reports  Review of Systems  Constitutional: Positive for fatigue.  HENT: Positive for mouth dryness and nose dryness. Negative for mouth sores.   Eyes: Positive for dryness. Negative for pain, itching and visual disturbance.  Respiratory: Positive for shortness of breath and difficulty breathing. Negative for cough, hemoptysis and wheezing.   Cardiovascular: Negative for chest pain, palpitations, hypertension and swelling in legs/feet.  Gastrointestinal: Negative for blood in  stool, constipation and diarrhea.  Endocrine: Negative for increased urination.  Genitourinary: Negative for difficulty urinating and painful urination.  Musculoskeletal: Positive for arthralgias, joint pain, joint swelling, myalgias, morning stiffness, muscle tenderness and myalgias. Negative for muscle weakness.  Skin: Negative for color change, pallor, rash, hair loss, nodules/bumps, redness, skin tightness, ulcers and sensitivity to sunlight.  Neurological: Positive for dizziness, numbness, memory loss and weakness. Negative for headaches.  Hematological: Positive for bruising/bleeding tendency. Negative for swollen glands.  Psychiatric/Behavioral: Positive for confusion. Negative for depressed mood and sleep disturbance. The patient is not nervous/anxious.     PMFS History:  Patient Active Problem List   Diagnosis Date Noted  . Hypercalcemia 09/23/2020  . Hyperparathyroidism, primary ([place]) 08/25/2020  . Iron deficiency anemia due to chronic blood loss 06/27/2019  . Coronary artery disease involving native coronary artery of native heart without angina pectoris 09/05/2018  . Syncope and collapse 09/05/2018  . Pain in joint of left elbow 09/04/2018  . Chest pain 05/27/2018  . HLD (hyperlipidemia) 05/27/2018  . Essential hypertension 05/27/2018  . Depression 05/27/2018  . OSA (obstructive sleep apnea) 05/03/2018  . Pulmonary hypertension ([place]) 05/03/2018  . Bimalleolar fracture of left ankle 08/12/2017  . Acute renal failure (ARF) ([place]) 08/09/2017  . Chronic diastolic (congestive) heart failure ([place]) 08/09/2017  . Other fatigue 06/13/2017  . Primary insomnia 06/13/2017  . History of migraine 06/13/2017  . COPD (chronic obstructive pulmonary disease) ([place]) 02/15/2017  . Tobacco abuse 02/15/2017  . Respiratory failure with hypoxia ([place]) 01/28/2017  .  Migraine 01/28/2017  . Daily headache   . Hypoxia   . Non-ST elevation myocardial infarction (NSTEMI), type 2 01/26/2017  . MGUS  (monoclonal gammopathy of unknown significance) 01/11/2012  . Fibromyalgia 01/11/2012  . Bruises easily 01/11/2012    Past Medical History:  Diagnosis Date  . CAD (coronary artery disease)    a. 01/2017: cath showing 20% mid RCA stenosis and otherwise normal LAD and LCx with a preserved EF of 55 to 60%; mod pulmonary HTN  . COPD (chronic obstructive pulmonary disease) ([place])    O2 dependent  . cpap   . Depression    PTSD  . Diastolic CHF ([place])   . Fibromyalgia   . Hyperlipidemia   . Hypertension   . Migraines     Family History  Problem Relation Age of Onset  . Diabetes Mother   . Hypertension Mother   . CAD Mother   . Diabetes Father   . Lung cancer Father   . Diabetes Sister   . Hypercalcemia Neg Hx    Past Surgical History:  Procedure Laterality Date  . APPENDECTOMY    . CESAREAN SECTION    . ORIF ANKLE FRACTURE Left 08/12/2017   Procedure: OPEN REDUCTION INTERNAL FIXATION (ORIF) ANKLE FRACTURE;  Surgeon: Rod Can, MD;  Location: [facility];  Service: Orthopedics;  Laterality: Left;  . RIGHT HEART CATH N/A 07/19/2018   Procedure: RIGHT HEART CATH;  Surgeon: Jolaine Artist, MD;  Location: [facility];  Service: Cardiovascular;  Laterality: N/A;  . RIGHT/LEFT HEART CATH AND CORONARY ANGIOGRAPHY N/A 01/27/2017   Procedure: Right/Left Heart Cath and Coronary Angiography;  Surgeon: Troy Sine, MD;  Location: [facility];  Service: Cardiovascular;  Laterality: N/A;   Social History   Social History Narrative  . Not on file   Immunization History  Administered Date(s) Administered  . Influenza Split 08/15/2017, 08/28/2018, 08/24/2019  . Influenza,inj,Quad PF,6+ Mos 09/04/2018, 08/03/2019, 08/29/2020  . Influenza,inj,quad, With Preservative 08/09/2014  . Influenza-Unspecified 08/15/2016  . Moderna Sars-Covid-2 Vaccination 02/11/2020, 03/10/2020, 07/11/2020  . Pneumococcal Polysaccharide-23 02/04/2014  . Pneumococcal-Unspecified 02/13/2013  . Tdap  02/04/2014  . Zoster 05/31/2019, 08/03/2019     Objective: Vital Signs: BP 111/74 (BP Location: Left Arm, Patient Position: Sitting, Cuff Size: Normal)   Pulse 84   Resp 18   Ht 5\' 6"  (1.676 m)   Wt 176 lb 3.2 oz (79.9 kg)   LMP 08/19/2012   BMI 28.44 kg/m    Physical Exam Vitals and nursing note reviewed.  Constitutional:      Appearance: She is well-developed and well-nourished.  HENT:     Head: Normocephalic and atraumatic.  Eyes:     Extraocular Movements: EOM normal.     Conjunctiva/sclera: Conjunctivae normal.  Cardiovascular:     Pulses: Intact distal pulses.  Pulmonary:     Effort: Pulmonary effort is normal.     Breath sounds: Normal breath sounds.  Abdominal:     Palpations: Abdomen is soft.  Musculoskeletal:     Cervical back: Normal range of motion.  Skin:    General: Skin is warm and dry.     Capillary Refill: Capillary refill takes less than 2 seconds.  Neurological:     Mental Status: She is alert and oriented to person, place, and time.  Psychiatric:        Mood and Affect: Mood and affect normal.        Behavior: Behavior normal.      Musculoskeletal Exam:  Generalized hyperalgesia and positive tender points on exam.  C-spine is limited range of motion with lateral rotation.  Trapezius muscle tension and muscle tenderness bilaterally.  Shoulder joints, elbow joints, wrist joints, MCPs, PIPs, DIPs have good range of motion with no synovitis.  She is able to make a complete fist bilaterally.  Knee joints have good range of motion with no warmth or effusion.  Ankle joints have good range of motion with no tenderness or inflammation.  CDAI Exam: CDAI Score: -- Patient Global: --; Provider Global: -- Swollen: --; Tender: -- Joint Exam 12/23/2020   No joint exam has been documented for this visit   There is currently no information documented on the homunculus. Go to the Rheumatology activity and complete the homunculus joint exam.  Investigation: No  additional findings.  Imaging: No results found.  Recent Labs: Lab Results  Component Value Date   WBC 5.2 08/27/2019   HGB 13.3 08/27/2019   PLT 164 08/27/2019   NA 142 08/27/2019   K 2.9 (LL) 08/27/2019   CL 100 08/27/2019   CO2 34 (H) 08/27/2019   GLUCOSE 97 08/27/2019   BUN 12 08/27/2019   CREATININE 1.04 (H) 08/27/2019   BILITOT <0.2 (L) 08/27/2019   ALKPHOS 112 08/27/2019   AST 12 (L) 08/27/2019   ALT 13 08/27/2019   PROT 7.5 08/27/2019   ALBUMIN 3.9 08/27/2019   CALCIUM 10.3 09/23/2020   GFRAA >60 08/27/2019    Speciality Comments: No specialty comments available.  Procedures:  Trigger Point Inj  Date/Time: 12/23/2020 2:29 PM Performed by: Gearldine Bienenstock, PA-C Authorized by: Gearldine Bienenstock, PA-C   Consent Given by:  Patient Site marked: the procedure site was marked   Timeout: prior to procedure the correct patient, procedure, and site was verified   Indications:  Pain Total # of Trigger Points:  2 Location: neck   Needle Size:  27 G Approach:  Dorsal Medications #1:  0.5 mL lidocaine 1 %; 10 mg triamcinolone acetonide 40 MG/ML Medications #2:  0.5 mL lidocaine 1 %; 10 mg triamcinolone acetonide 40 MG/ML Patient tolerance:  Patient tolerated the procedure well with no immediate complications   Allergies: Citrus and Lisinopril   Assessment / Plan:     Visit Diagnoses: Fibromyalgia: She has generalized hyperalgesia and positive tender points on exam.  She has been experiencing frequent and severe fibromyalgia flares.She has been taking Cymbalta 30 mg 1 capsule by mouth daily.  She presents today with trapezius muscle tension and muscle tenderness bilaterally.  She has been experiencing more frequent muscle spasms.  She ran out of the prescription for Skelaxin and tizanidine and has been experiencing increased generalized pain and muscle spasms since then.  We will refill Skelaxin at a reduced dose of 400 mg 1 tablet daily as needed and Zanaflex 4 mg by mouth at  bedtime as needed for muscle spasms.  She requested trigger point injections today.  She tolerated the procedure well.  Procedure note was completed above.  We discussed the importance of regular stretching and strengthening.  She was given a handout of neck exercises to perform.  She will follow-up in the office in 3 months.  Trapezius muscle spasm: She presents today with trapezius muscle tension and muscle tenderness bilaterally.  She has been experiencing muscle spasms more frequently.  She requested trigger point injections today.  She tolerated procedure well.  Aftercare was discussed.  Procedure note was completed above.  She was given a handout of neck exercises  to perform.  She would like to return in 3 months for repeat injections at that time.  Trochanteric bursitis of both hips: She has tenderness location of her bilateral trochanteric bursa, L>R.   Other fatigue: She continues to have chronic fatigue secondary to insomnia.  She has been sleeping about 5 hours per night.  She continues to take trazodone 100 mg a mouth at bedtime for insomnia.  We discussed the importance of good sleep hygiene.  Primary insomnia: She takes trazodone 100 mg by mouth at bedtime for insomnia.  Other medical conditions are listed as follows:  History of migraine  History of COPD  MGUS (monoclonal gammopathy of unknown significance)  Chronic diastolic (congestive) heart failure (HCC)  Coronary artery disease involving native coronary artery of native heart without angina pectoris  Pulmonary hypertension (HCC)  Essential hypertension  Former smoker  Orders: Orders Placed This Encounter  Procedures  . Trigger Point Inj   No orders of the defined types were placed in this encounter.    Follow-Up Instructions: Return in about 3 months (around 03/22/2021) for Fibromyalgia, Osteoarthritis.   Gearldine Bienenstock, PA-C  Note - This record has been created using Dragon software.  Chart creation errors  have been sought, but may not always  have been located. Such creation errors do not reflect on  the standard of medical care.

## 2020-12-22 ENCOUNTER — Other Ambulatory Visit: Payer: Self-pay

## 2020-12-23 ENCOUNTER — Ambulatory Visit (INDEPENDENT_AMBULATORY_CARE_PROVIDER_SITE_OTHER): Payer: 59 | Admitting: Physician Assistant

## 2020-12-23 ENCOUNTER — Encounter: Payer: Self-pay | Admitting: Physician Assistant

## 2020-12-23 ENCOUNTER — Other Ambulatory Visit: Payer: Self-pay

## 2020-12-23 VITALS — BP 111/74 | HR 84 | Resp 18 | Ht 66.0 in | Wt 176.2 lb

## 2020-12-23 DIAGNOSIS — M62838 Other muscle spasm: Secondary | ICD-10-CM | POA: Diagnosis not present

## 2020-12-23 DIAGNOSIS — I2510 Atherosclerotic heart disease of native coronary artery without angina pectoris: Secondary | ICD-10-CM

## 2020-12-23 DIAGNOSIS — M797 Fibromyalgia: Secondary | ICD-10-CM

## 2020-12-23 DIAGNOSIS — M7062 Trochanteric bursitis, left hip: Secondary | ICD-10-CM

## 2020-12-23 DIAGNOSIS — D472 Monoclonal gammopathy: Secondary | ICD-10-CM

## 2020-12-23 DIAGNOSIS — F5101 Primary insomnia: Secondary | ICD-10-CM | POA: Diagnosis not present

## 2020-12-23 DIAGNOSIS — R5383 Other fatigue: Secondary | ICD-10-CM | POA: Diagnosis not present

## 2020-12-23 DIAGNOSIS — M7061 Trochanteric bursitis, right hip: Secondary | ICD-10-CM | POA: Diagnosis not present

## 2020-12-23 DIAGNOSIS — I10 Essential (primary) hypertension: Secondary | ICD-10-CM

## 2020-12-23 DIAGNOSIS — I5032 Chronic diastolic (congestive) heart failure: Secondary | ICD-10-CM

## 2020-12-23 DIAGNOSIS — Z8669 Personal history of other diseases of the nervous system and sense organs: Secondary | ICD-10-CM

## 2020-12-23 DIAGNOSIS — I272 Pulmonary hypertension, unspecified: Secondary | ICD-10-CM

## 2020-12-23 DIAGNOSIS — Z87891 Personal history of nicotine dependence: Secondary | ICD-10-CM

## 2020-12-23 DIAGNOSIS — Z8709 Personal history of other diseases of the respiratory system: Secondary | ICD-10-CM

## 2020-12-23 MED ORDER — LIDOCAINE HCL 1 % IJ SOLN
0.5000 mL | INTRAMUSCULAR | Status: AC | PRN
  Administered 2020-12-23: .5 mL

## 2020-12-23 MED ORDER — TIZANIDINE HCL 4 MG PO TABS: 4.0000 mg | ORAL_TABLET | Freq: Every evening | ORAL | 2 refills | Status: DC | PRN

## 2020-12-23 MED ORDER — METAXALONE 400 MG PO TABS: 400.0000 mg | ORAL_TABLET | Freq: Every day | ORAL | 2 refills | Status: DC | PRN

## 2020-12-23 MED ORDER — TRIAMCINOLONE ACETONIDE 40 MG/ML IJ SUSP
10.0000 mg | INTRAMUSCULAR | Status: AC | PRN
  Administered 2020-12-23: 10 mg via INTRAMUSCULAR

## 2020-12-23 NOTE — Telephone Encounter (Signed)
Please review skelaxin and tizanidine refills requested at appointment this afternoon and send to the pharmacy. Thanks!

## 2020-12-23 NOTE — Patient Instructions (Signed)

## 2020-12-24 ENCOUNTER — Ambulatory Visit: Payer: 59 | Admitting: Endocrinology

## 2020-12-24 ENCOUNTER — Telehealth: Payer: Self-pay | Admitting: *Deleted

## 2020-12-24 NOTE — Telephone Encounter (Signed)
Submitted a Prior Authorization request to [facility] for Metaxalone via Cover My Meds. Will update once we receive a response.

## 2020-12-25 NOTE — Progress Notes (Signed)
Attempted to obtain medical history via telephone, unable to reach at this time. I left a voicemail to return pre surgical testing department's phone call.  

## 2020-12-26 ENCOUNTER — Other Ambulatory Visit: Payer: Self-pay | Admitting: Physician Assistant

## 2020-12-26 ENCOUNTER — Other Ambulatory Visit: Payer: Self-pay | Admitting: Gastroenterology

## 2020-12-26 NOTE — Telephone Encounter (Signed)
Patient advised metaxalone has been denied. Patient would like to know if there is anything else that can be prescribed in place of the metaxalone.

## 2020-12-26 NOTE — Telephone Encounter (Signed)
Patient advised she can try taking methocarbamol 500 mg 1 tablet by mouth daily as needed for muscle spasms.  We already prescribe her zanaflex, so her insurance may not approve methocarbamol. Patient states she will try just taking the Zanaflex and will call back if that is not helping.

## 2020-12-26 NOTE — Telephone Encounter (Signed)
Received a fax regarding Prior Authorization from [place] for [place]. Authorization has been DENIED because must try and fail 3 or more in a class with at least 3 alternatives, 2 in a class with 2 alternatives or 1 in a class with only 1 alternative. Formulary Alternative: Cyclobenzaprine.  Intolerance of Cyclobenzaprine included in PA and was still denied.

## 2020-12-26 NOTE — Telephone Encounter (Signed)
Please notify the patient that metaxalone has been denied.

## 2020-12-26 NOTE — Telephone Encounter (Signed)
She can try taking methocarbamol 500 mg 1 tablet by mouth daily as needed for muscle spasms.  We already prescribe her zanaflex, so her insurance may not approve methocarbamol.

## 2020-12-29 ENCOUNTER — Other Ambulatory Visit (HOSPITAL_COMMUNITY)
Admission: RE | Admit: 2020-12-29 | Discharge: 2020-12-29 | Disposition: A | Discharge status: Home / Self Care | Payer: 59 | Source: Ambulatory Visit | Attending: Gastroenterology | Admitting: Gastroenterology

## 2020-12-29 DIAGNOSIS — Z01812 Encounter for preprocedural laboratory examination: Secondary | ICD-10-CM | POA: Insufficient documentation

## 2020-12-29 DIAGNOSIS — Z20822 Contact with and (suspected) exposure to covid-19: Secondary | ICD-10-CM | POA: Diagnosis not present

## 2020-12-30 LAB — SARS CORONAVIRUS 2 (TAT 6-24 HRS): SARS Coronavirus 2: NEGATIVE

## 2020-12-31 ENCOUNTER — Ambulatory Visit: Payer: 59 | Admitting: Acute Care

## 2020-12-31 ENCOUNTER — Ambulatory Visit: Payer: 59

## 2021-01-01 ENCOUNTER — Ambulatory Visit: Payer: 59 | Admitting: Adult Health

## 2021-01-01 ENCOUNTER — Ambulatory Visit (HOSPITAL_COMMUNITY)
Admission: RE | Admit: 2021-01-01 | Discharge: 2021-01-01 | Disposition: A | Discharge status: Home / Self Care | Payer: 59 | Attending: Gastroenterology | Admitting: Gastroenterology

## 2021-01-01 ENCOUNTER — Ambulatory Visit (HOSPITAL_COMMUNITY): Payer: 59 | Admitting: Anesthesiology

## 2021-01-01 ENCOUNTER — Encounter (HOSPITAL_COMMUNITY)
Admission: RE | Disposition: A | Discharge status: Home / Self Care | Payer: Self-pay | Source: Home / Self Care | Attending: Gastroenterology

## 2021-01-01 ENCOUNTER — Encounter (HOSPITAL_COMMUNITY): Payer: Self-pay | Admitting: Gastroenterology

## 2021-01-01 ENCOUNTER — Other Ambulatory Visit: Payer: Self-pay

## 2021-01-01 ENCOUNTER — Other Ambulatory Visit: Payer: Self-pay | Admitting: Rheumatology

## 2021-01-01 DIAGNOSIS — J449 Chronic obstructive pulmonary disease, unspecified: Secondary | ICD-10-CM | POA: Insufficient documentation

## 2021-01-01 DIAGNOSIS — Z794 Long term (current) use of insulin: Secondary | ICD-10-CM | POA: Insufficient documentation

## 2021-01-01 DIAGNOSIS — K5730 Diverticulosis of large intestine without perforation or abscess without bleeding: Secondary | ICD-10-CM | POA: Insufficient documentation

## 2021-01-01 DIAGNOSIS — Z8616 Personal history of COVID-19: Secondary | ICD-10-CM | POA: Diagnosis not present

## 2021-01-01 DIAGNOSIS — K635 Polyp of colon: Secondary | ICD-10-CM | POA: Diagnosis not present

## 2021-01-01 DIAGNOSIS — Z860100 Personal history of colon polyps, unspecified: Secondary | ICD-10-CM

## 2021-01-01 DIAGNOSIS — Z8719 Personal history of other diseases of the digestive system: Secondary | ICD-10-CM | POA: Insufficient documentation

## 2021-01-01 DIAGNOSIS — Z801 Family history of malignant neoplasm of trachea, bronchus and lung: Secondary | ICD-10-CM | POA: Diagnosis not present

## 2021-01-01 DIAGNOSIS — K219 Gastro-esophageal reflux disease without esophagitis: Secondary | ICD-10-CM | POA: Insufficient documentation

## 2021-01-01 DIAGNOSIS — Z800 Family history of malignant neoplasm of digestive organs: Secondary | ICD-10-CM | POA: Diagnosis not present

## 2021-01-01 DIAGNOSIS — K449 Diaphragmatic hernia without obstruction or gangrene: Secondary | ICD-10-CM | POA: Diagnosis not present

## 2021-01-01 DIAGNOSIS — Z8371 Family history of colonic polyps: Secondary | ICD-10-CM | POA: Insufficient documentation

## 2021-01-01 DIAGNOSIS — Z79899 Other long term (current) drug therapy: Secondary | ICD-10-CM | POA: Diagnosis not present

## 2021-01-01 DIAGNOSIS — Z888 Allergy status to other drugs, medicaments and biological substances status: Secondary | ICD-10-CM | POA: Diagnosis not present

## 2021-01-01 DIAGNOSIS — K2900 Acute gastritis without bleeding: Secondary | ICD-10-CM | POA: Insufficient documentation

## 2021-01-01 DIAGNOSIS — E7800 Pure hypercholesterolemia, unspecified: Secondary | ICD-10-CM | POA: Insufficient documentation

## 2021-01-01 DIAGNOSIS — Z9981 Dependence on supplemental oxygen: Secondary | ICD-10-CM | POA: Diagnosis not present

## 2021-01-01 DIAGNOSIS — K640 First degree hemorrhoids: Secondary | ICD-10-CM | POA: Insufficient documentation

## 2021-01-01 DIAGNOSIS — I10 Essential (primary) hypertension: Secondary | ICD-10-CM | POA: Diagnosis not present

## 2021-01-01 DIAGNOSIS — Z7982 Long term (current) use of aspirin: Secondary | ICD-10-CM | POA: Insufficient documentation

## 2021-01-01 DIAGNOSIS — F17210 Nicotine dependence, cigarettes, uncomplicated: Secondary | ICD-10-CM | POA: Insufficient documentation

## 2021-01-01 DIAGNOSIS — Z8379 Family history of other diseases of the digestive system: Secondary | ICD-10-CM | POA: Insufficient documentation

## 2021-01-01 DIAGNOSIS — Z1211 Encounter for screening for malignant neoplasm of colon: Secondary | ICD-10-CM | POA: Insufficient documentation

## 2021-01-01 HISTORY — PX: ESOPHAGOGASTRODUODENOSCOPY (EGD) WITH PROPOFOL: SHX5813

## 2021-01-01 HISTORY — PX: BIOPSY: SHX5522

## 2021-01-01 HISTORY — PX: COLONOSCOPY WITH PROPOFOL: SHX5780

## 2021-01-01 SURGERY — COLONOSCOPY WITH PROPOFOL
Anesthesia: Monitor Anesthesia Care

## 2021-01-01 MED ORDER — SODIUM CHLORIDE 0.9 % IV SOLN: INTRAVENOUS | Status: DC

## 2021-01-01 MED ORDER — PROPOFOL 500 MG/50ML IV EMUL
INTRAVENOUS | Status: DC | PRN
  Administered 2021-01-01: 100 ug/kg/min via INTRAVENOUS
  Administered 2021-01-01: 50 mg via INTRAVENOUS

## 2021-01-01 MED ORDER — LACTATED RINGERS IV SOLN: INTRAVENOUS | Status: DC

## 2021-01-01 SURGICAL SUPPLY — 24 items

## 2021-01-01 NOTE — H&P (Signed)
  Date of Initial H&P: 12/26/20  History reviewed, patient examined, no change in status, stable for surgery.

## 2021-01-01 NOTE — Telephone Encounter (Signed)
Last Visit: 12/23/2020 Next Visit: 03/24/2021  Current Dose per office note on 12/23/2020, Cymbalta 30 mg 1 capsule by mouth daily. Dx: Fibromyalgia:  Last Fill: 09/03/2020  Okay to refill Cymbalta?

## 2021-01-01 NOTE — Transfer of Care (Signed)
Immediate Anesthesia Transfer of Care Note  Patient: Kaci Dillie  Procedure(s) Performed: Procedure(s): COLONOSCOPY WITH PROPOFOL (N/A) ESOPHAGOGASTRODUODENOSCOPY (EGD) WITH PROPOFOL (N/A) BIOPSY  Patient Location: PACU and Endoscopy Unit  Anesthesia Type:MAC  Level of Consciousness: awake, alert  and oriented  Airway & Oxygen Therapy: Patient Spontanous Breathing and Patient connected to nasal cannula oxygen  Post-op Assessment: Report given to RN and Post -op Vital signs reviewed and stable  Post vital signs: Reviewed and stable  Last Vitals:  Vitals:   01/01/21 0855  BP: 113/70  Pulse: (!) 56  Resp: 14  Temp: 36.7 C  SpO2: 211%    Complications: No apparent anesthesia complications

## 2021-01-01 NOTE — Anesthesia Preprocedure Evaluation (Addendum)
Anesthesia Evaluation  Patient identified by MRN, date of birth, ID band Patient awake    Reviewed: Allergy & Precautions, H&P , NPO status , Patient's Chart, lab work & pertinent test results  Airway Mallampati: III  TM Distance: >3 FB Neck ROM: Full    Dental no notable dental hx. (+) Teeth Intact, Dental Advisory Given   Pulmonary sleep apnea , COPD,  oxygen dependent, Current Smoker,    Pulmonary exam normal        Cardiovascular hypertension, Pt. on medications  Rhythm:Regular Rate:Normal     Neuro/Psych  Headaches, Depression    GI/Hepatic negative GI ROS, Neg liver ROS,   Endo/Other  negative endocrine ROS  Renal/GU negative Renal ROS  negative genitourinary   Musculoskeletal  (+) Fibromyalgia -  Abdominal   Peds  Hematology  (+) Blood dyscrasia, anemia ,   Anesthesia Other Findings   Reproductive/Obstetrics negative OB ROS                            Anesthesia Physical Anesthesia Plan  ASA: III  Anesthesia Plan: MAC   Post-op Pain Management:    Induction: Intravenous  PONV Risk Score and Plan: 1 and Propofol infusion  Airway Management Planned: Nasal Cannula  Additional Equipment:   Intra-op Plan:   Post-operative Plan:   Informed Consent: I have reviewed the patients History and Physical, chart, labs and discussed the procedure including the risks, benefits and alternatives for the proposed anesthesia with the patient or authorized representative who has indicated his/her understanding and acceptance.     Dental advisory given  Plan Discussed with: CRNA  Anesthesia Plan Comments:         Anesthesia Quick Evaluation

## 2021-01-01 NOTE — Interval H&P Note (Signed)
History and Physical Interval Note:  01/01/2021 9:27 AM  Teresa Roberts  has presented today for surgery, with the diagnosis of History of colon polyps/GERD.  The various methods of treatment have been discussed with the patient and family. After consideration of risks, benefits and other options for treatment, the patient has consented to  Procedure(s): COLONOSCOPY WITH PROPOFOL (N/A) ESOPHAGOGASTRODUODENOSCOPY (EGD) WITH PROPOFOL (N/A) as a surgical intervention.  The patient's history has been reviewed, patient examined, no change in status, stable for surgery.  I have reviewed the patient's chart and labs.  Questions were answered to the patient's satisfaction.     [name]

## 2021-01-01 NOTE — Op Note (Signed)
[facility] Patient Name: Teresa Roberts Procedure Date: 01/01/2021 MRN: [id number] Attending MD: [name] , MD Date of Birth: [birth year] CSN: [id number] Age: 60 Admit Type: Outpatient Procedure:                Colonoscopy Indications:              High risk colon cancer surveillance: Personal                            history of colonic polyps, Last colonoscopy: date                            unknown Providers:                [name], MD, Particia Nearing, RN, Janee Morn, Technician Referring MD:             Jonathon Jordan Medicines:                Propofol per Anesthesia, Monitored Anesthesia Care Complications:            No immediate complications. Estimated Blood Loss:     Estimated blood loss was minimal. Procedure:                Pre-Anesthesia Assessment:                           - Prior to the procedure, a History and Physical                            was performed, and patient medications and                            allergies were reviewed. The patient's tolerance of                            previous anesthesia was also reviewed. The risks                            and benefits of the procedure and the sedation                            options and risks were discussed with the patient.                            All questions were answered, and informed consent                            was obtained. Prior Anticoagulants: The patient has                            taken no previous anticoagulant or antiplatelet  agents. ASA Grade Assessment: III - A patient with                            severe systemic disease. After reviewing the risks                            and benefits, the patient was deemed in                            satisfactory condition to undergo the procedure.                           After obtaining informed consent, the colonoscope                             was passed under direct vision. Throughout the                            procedure, the patient's blood pressure, pulse, and                            oxygen saturations were monitored continuously. The                            CF-HQ190L ([id number] Olympus colonoscope was                            introduced through the anus and advanced to the the                            cecum, identified by appendiceal orifice and                            ileocecal valve. The colonoscopy was performed with                            difficulty due to poor bowel prep with stool                            present, significant looping and a tortuous colon.                            Successful completion of the procedure was aided by                            straightening and shortening the scope to obtain                            bowel loop reduction, using scope torsion, applying                            abdominal pressure and lavage. The patient  tolerated the procedure well. The quality of the                            bowel preparation was poor and not adequate to                            identify polyps 6 mm and larger in size. The                            ileocecal valve, appendiceal orifice, and rectum                            were photographed. Scope In: 9:42:59 AM Scope Out: 9:54:09 AM Scope Withdrawal Time: 0 hours 3 minutes 36 seconds  Total Procedure Duration: 0 hours 11 minutes 10 seconds  Findings:      The perianal and digital rectal examinations were normal.      A 3 mm polyp was found in the sigmoid colon. The polyp was semi-sessile.       The polyp was removed with a cold biopsy forceps. Resection and       retrieval were complete. Estimated blood loss was minimal.      Multiple small and large-mouthed diverticula were found in the sigmoid       colon.      Internal hemorrhoids were found during retroflexion. The hemorrhoids        were large and Grade I (internal hemorrhoids that do not prolapse).      Copious quantities of semi-solid solid stool was found in the entire       colon, precluding visualization. Impression:               - Preparation of the colon was poor.                           - Preparation of the colon was inadequate.                           - One 3 mm polyp in the sigmoid colon, removed with                            a cold biopsy forceps. Resected and retrieved.                           - Diverticulosis in the sigmoid colon.                           - Internal hemorrhoids.                           - Stool in the entire examined colon. Moderate Sedation:      Not Applicable - Patient had care per Anesthesia. Recommendation:           - Patient has a contact number available for                            emergencies. The signs and symptoms of potential  delayed complications were discussed with the                            patient. Return to normal activities tomorrow.                            Written discharge instructions were provided to the                            patient.                           - High fiber diet.                           - Await pathology results.                           - Continue present medications.                           - Repeat colonoscopy in 3 months because the bowel                            preparation was poor. Procedure Code(s):        --- Professional ---                           [phone], Colonoscopy, flexible; with biopsy, single                            or multiple Diagnosis Code(s):        --- Professional ---                           Z86.010, Personal history of colonic polyps                           K63.5, Polyp of colon                           K64.0, First degree hemorrhoids                           K57.30, Diverticulosis of large intestine without                            perforation or  abscess without bleeding CPT copyright 2019 [employer]. All rights reserved. The codes documented in this report are preliminary and upon coder review may  be revised to meet current compliance requirements. [name], MD 01/01/2021 10:05:13 AM This report has been signed electronically. Number of Addenda: 0

## 2021-01-01 NOTE — Discharge Instructions (Signed)

## 2021-01-01 NOTE — Op Note (Signed)
[facility] Patient Name: Teresa Roberts Procedure Date: 01/01/2021 MRN: [id number] Attending MD: [name] , MD Date of Birth: 1961/04/19 CSN: [id number] Age: 60 Admit Type: Outpatient Procedure:                Upper GI endoscopy Indications:              Heartburn, Esophageal reflux Providers:                [name], MD, Particia Nearing, RN, Janee Morn, Technician Referring MD:             Jonathon Jordan Medicines:                Propofol per Anesthesia, Monitored Anesthesia Care Complications:            No immediate complications. Estimated Blood Loss:     Estimated blood loss: none. Procedure:                Pre-Anesthesia Assessment:                           - Prior to the procedure, a History and Physical                            was performed, and patient medications and                            allergies were reviewed. The patient's tolerance of                            previous anesthesia was also reviewed. The risks                            and benefits of the procedure and the sedation                            options and risks were discussed with the patient.                            All questions were answered, and informed consent                            was obtained. Prior Anticoagulants: The patient has                            taken no previous anticoagulant or antiplatelet                            agents. ASA Grade Assessment: III - A patient with                            severe systemic disease. After reviewing the risks  and benefits, the patient was deemed in                            satisfactory condition to undergo the procedure.                           After obtaining informed consent, the endoscope was                            passed under direct vision. Throughout the                            procedure, the patient's blood pressure, pulse,  and                            oxygen saturations were monitored continuously. The                            GIF-[id number] ([id number]) was introduced through the                            mouth, and advanced to the second part of duodenum.                            The upper GI endoscopy was accomplished without                            difficulty. The patient tolerated the procedure                            well. Scope In: Scope Out: Findings:      The examined esophagus was normal.      The Z-line was regular and was found 40 cm from the incisors.      A medium-sized hiatal hernia was present.      Segmental mild inflammation characterized by congestion (edema) and       linear erosions was found in the gastric antrum.      The exam of the stomach was otherwise normal.      The examined duodenum was normal. Impression:               - Normal esophagus.                           - Z-line regular, 40 cm from the incisors.                           - Medium-sized hiatal hernia.                           - Acute gastritis.                           - Normal examined duodenum.                           - No  specimens collected. Moderate Sedation:      Not Applicable - Patient had care per Anesthesia. Recommendation:           - Patient has a contact number available for                            emergencies. The signs and symptoms of potential                            delayed complications were discussed with the                            patient. Return to normal activities tomorrow.                            Written discharge instructions were provided to the                            patient.                           - Follow an antireflux regimen. Procedure Code(s):        --- Professional ---                           [phone], Esophagogastroduodenoscopy, flexible,                            transoral; diagnostic, including collection of                            specimen(s)  by brushing or washing, when performed                            (separate procedure) Diagnosis Code(s):        --- Professional ---                           R12, Heartburn                           K21.9, Gastro-esophageal reflux disease without                            esophagitis                           K29.00, Acute gastritis without bleeding                           K44.9, Diaphragmatic hernia without obstruction or                            gangrene CPT copyright 2019 [employer]. All rights reserved. The codes documented in this report are preliminary and upon coder review may  be revised to meet current compliance requirements. [name], MD 01/01/2021 9:59:26 AM This report has been signed electronically. Number  of Addenda: 0

## 2021-01-01 NOTE — Anesthesia Postprocedure Evaluation (Signed)
Anesthesia Post Note  Patient: Teresa Roberts  Procedure(s) Performed: COLONOSCOPY WITH PROPOFOL (N/A ) ESOPHAGOGASTRODUODENOSCOPY (EGD) WITH PROPOFOL (N/A ) BIOPSY     Patient location during evaluation: Endoscopy Anesthesia Type: MAC Level of consciousness: awake and alert Pain management: pain level controlled Vital Signs Assessment: post-procedure vital signs reviewed and stable Respiratory status: spontaneous breathing, nonlabored ventilation, respiratory function stable and patient connected to nasal cannula oxygen Cardiovascular status: stable and blood pressure returned to baseline Postop Assessment: no apparent nausea or vomiting Anesthetic complications: no   No complications documented.  Last Vitals:  Vitals:   01/01/21 1010 01/01/21 1020  BP: 107/73 115/72  Pulse: 69 (!) 57  Resp: 14 15  Temp:    SpO2: 100% 100%    Last Pain:  Vitals:   01/01/21 1020  TempSrc:   PainSc: 0-No pain                 Mithra Spano,W. EDMOND

## 2021-01-02 ENCOUNTER — Encounter (HOSPITAL_COMMUNITY): Payer: Self-pay | Admitting: Gastroenterology

## 2021-01-02 LAB — SURGICAL PATHOLOGY

## 2021-01-09 NOTE — Interval H&P Note (Signed)
History and Physical Interval Note:  01/09/2021 4:49 PM  Teresa Roberts  has presented today for surgery, with the diagnosis of History of colon polyps/GERD.  The various methods of treatment have been discussed with the patient and family. After consideration of risks, benefits and other options for treatment, the patient has consented to  Procedure(s): COLONOSCOPY WITH PROPOFOL (N/A) ESOPHAGOGASTRODUODENOSCOPY (EGD) WITH PROPOFOL (N/A) BIOPSY as a surgical intervention.  The patient's history has been reviewed, patient examined, no change in status, stable for surgery.  I have reviewed the patient's chart and labs.  Questions were answered to the patient's satisfaction.     [name]

## 2021-01-22 ENCOUNTER — Telehealth: Payer: Self-pay | Admitting: Pulmonary Disease

## 2021-01-22 NOTE — Telephone Encounter (Signed)
Called and spoke with pt and she stated that she spoke with [facility] and they advised her that the order for the oxygen needed to be sent to them so they can approve it.  I advised the pt that we do not send orders to the [facility].  We send the orders for oxygen to the DME companies.  She stated that the one that was sent to [employer] was denied.   PCC's can we send the POC order to a DME that her insurance will cover?  Thanks

## 2021-01-22 NOTE — Telephone Encounter (Signed)
Pt is calling in regards to [place] O2 states that she was informed by our office it was denied but she reached out to [facility] and they had no prior authorization on file for it and she is wanting for Dr. Elsworth Soho to send one in for her to have it. [place] regard [phone]

## 2021-02-02 DIAGNOSIS — M25572 Pain in left ankle and joints of left foot: Secondary | ICD-10-CM | POA: Diagnosis not present

## 2021-02-02 DIAGNOSIS — M5459 Other low back pain: Secondary | ICD-10-CM | POA: Diagnosis not present

## 2021-02-05 ENCOUNTER — Other Ambulatory Visit: Payer: Self-pay | Admitting: Physician Assistant

## 2021-03-02 DIAGNOSIS — M5459 Other low back pain: Secondary | ICD-10-CM | POA: Diagnosis not present

## 2021-03-10 NOTE — Progress Notes (Deleted)
Office Visit Note  Patient: Teresa Roberts             Date of Birth: 1961/03/08           MRN: [id number]             PCP: Jonathon Jordan, MD Referring: Jonathon Jordan, MD Visit Date: 03/24/2021 Occupation: @GUAROCC @  Subjective:  No chief complaint on file.   History of Present Illness: Rim Thatch is a 60 y.o. female ***   Activities of Daily Living:  Patient reports morning stiffness for *** {minute/hour:19697}.   Patient {ACTIONS;DENIES/REPORTS:21021675::"Denies"} nocturnal pain.  Difficulty dressing/grooming: {ACTIONS;DENIES/REPORTS:21021675::"Denies"} Difficulty climbing stairs: {ACTIONS;DENIES/REPORTS:21021675::"Denies"} Difficulty getting out of chair: {ACTIONS;DENIES/REPORTS:21021675::"Denies"} Difficulty using hands for taps, buttons, cutlery, and/or writing: {ACTIONS;DENIES/REPORTS:21021675::"Denies"}  No Rheumatology ROS completed.   PMFS History:  Patient Active Problem List   Diagnosis Date Noted  . Personal history of colonic polyps 01/01/2021  . Esophageal reflux 01/01/2021  . Hypercalcemia 09/23/2020  . Hyperparathyroidism, primary ([place]) 08/25/2020  . Iron deficiency anemia due to chronic blood loss 06/27/2019  . Coronary artery disease involving native coronary artery of native heart without angina pectoris 09/05/2018  . Syncope and collapse 09/05/2018  . Pain in joint of left elbow 09/04/2018  . Chest pain 05/27/2018  . HLD (hyperlipidemia) 05/27/2018  . Essential hypertension 05/27/2018  . Depression 05/27/2018  . OSA (obstructive sleep apnea) 05/03/2018  . Pulmonary hypertension ([place]) 05/03/2018  . Bimalleolar fracture of left ankle 08/12/2017  . Acute renal failure (ARF) ([place]) 08/09/2017  . Chronic diastolic (congestive) heart failure ([place]) 08/09/2017  . Other fatigue 06/13/2017  . Primary insomnia 06/13/2017  . History of migraine 06/13/2017  . COPD (chronic obstructive pulmonary disease) ([place]) 02/15/2017  . Tobacco abuse 02/15/2017  .  Respiratory failure with hypoxia ([place]) 01/28/2017  . Migraine 01/28/2017  . Daily headache   . Hypoxia   . Non-ST elevation myocardial infarction (NSTEMI), type 2 01/26/2017  . MGUS (monoclonal gammopathy of unknown significance) 01/11/2012  . Fibromyalgia 01/11/2012  . Bruises easily 01/11/2012    Past Medical History:  Diagnosis Date  . CAD (coronary artery disease)    a. 01/2017: cath showing 20% mid RCA stenosis and otherwise normal LAD and LCx with a preserved EF of 55 to 60%; mod pulmonary HTN  . COPD (chronic obstructive pulmonary disease) ([place])    O2 dependent  . cpap   . Depression    PTSD  . Diastolic CHF ([place])   . Fibromyalgia   . Hyperlipidemia   . Hypertension   . Migraines     Family History  Problem Relation Age of Onset  . Diabetes Mother   . Hypertension Mother   . CAD Mother   . Diabetes Father   . Lung cancer Father   . Diabetes Sister   . Hypercalcemia Neg Hx    Past Surgical History:  Procedure Laterality Date  . APPENDECTOMY    . BIOPSY  01/01/2021   Procedure: BIOPSY;  Surgeon: Wilford Corner, MD;  Location: [facility];  Service: Endoscopy;;  . CESAREAN SECTION    . COLONOSCOPY WITH PROPOFOL N/A 01/01/2021   Procedure: COLONOSCOPY WITH PROPOFOL;  Surgeon: Wilford Corner, MD;  Location: [facility];  Service: Endoscopy;  Laterality: N/A;  . ESOPHAGOGASTRODUODENOSCOPY (EGD) WITH PROPOFOL N/A 01/01/2021   Procedure: ESOPHAGOGASTRODUODENOSCOPY (EGD) WITH PROPOFOL;  Surgeon: Wilford Corner, MD;  Location: [facility];  Service: Endoscopy;  Laterality: N/A;  . ORIF ANKLE FRACTURE Left 08/12/2017   Procedure: OPEN REDUCTION INTERNAL FIXATION (ORIF) ANKLE  FRACTURE;  Surgeon: Rod Can, MD;  Location: [place];  Service: Orthopedics;  Laterality: Left;  . RIGHT HEART CATH N/A 07/19/2018   Procedure: RIGHT HEART CATH;  Surgeon: Jolaine Artist, MD;  Location: [place] [facility];  Service: Cardiovascular;  Laterality: N/A;  . RIGHT/LEFT HEART  CATH AND CORONARY ANGIOGRAPHY N/A 01/27/2017   Procedure: Right/Left Heart Cath and Coronary Angiography;  Surgeon: Troy Sine, MD;  Location: [place] [facility];  Service: Cardiovascular;  Laterality: N/A;   Social History   Social History Narrative  . Not on file   Immunization History  Administered Date(s) Administered  . Influenza Split 08/15/2017, 08/28/2018, 08/24/2019  . Influenza,inj,Quad PF,6+ Mos 09/04/2018, 08/03/2019, 08/29/2020  . Influenza,inj,quad, With Preservative 08/09/2014  . Influenza-Unspecified 08/15/2016  . Moderna Sars-Covid-2 Vaccination 02/11/2020, 03/10/2020, 07/11/2020  . Pneumococcal Polysaccharide-23 02/04/2014  . Pneumococcal-Unspecified 02/13/2013  . Tdap 02/04/2014  . Zoster 05/31/2019, 08/03/2019     Objective: Vital Signs: LMP 08/19/2012    Physical Exam   Musculoskeletal Exam: ***  CDAI Exam: CDAI Score: -- Patient Global: --; Provider Global: -- Swollen: --; Tender: -- Joint Exam 03/24/2021   No joint exam has been documented for this visit   There is currently no information documented on the homunculus. Go to the Rheumatology activity and complete the homunculus joint exam.  Investigation: No additional findings.  Imaging: No results found.  Recent Labs: Lab Results  Component Value Date   WBC 5.2 08/27/2019   HGB 13.3 08/27/2019   PLT 164 08/27/2019   NA 142 08/27/2019   K 2.9 (LL) 08/27/2019   CL 100 08/27/2019   CO2 34 (H) 08/27/2019   GLUCOSE 97 08/27/2019   BUN 12 08/27/2019   CREATININE 1.04 (H) 08/27/2019   BILITOT <0.2 (L) 08/27/2019   ALKPHOS 112 08/27/2019   AST 12 (L) 08/27/2019   ALT 13 08/27/2019   PROT 7.5 08/27/2019   ALBUMIN 3.9 08/27/2019   CALCIUM 10.3 09/23/2020   GFRAA >60 08/27/2019    Speciality Comments: No specialty comments available.  Procedures:  No procedures performed Allergies: Citrus and Lisinopril   Assessment / Plan:     Visit Diagnoses: No diagnosis  found.  Orders: No orders of the defined types were placed in this encounter.  No orders of the defined types were placed in this encounter.   Face-to-face time spent with patient was *** minutes. Greater than 50% of time was spent in counseling and coordination of care.  Follow-Up Instructions: No follow-ups on file.   Earnestine Mealing, CMA  Note - This record has been created using Editor, commissioning.  Chart creation errors have been sought, but may not always  have been located. Such creation errors do not reflect on  the standard of medical care.

## 2021-03-12 ENCOUNTER — Other Ambulatory Visit: Payer: Self-pay | Admitting: Physician Assistant

## 2021-03-12 NOTE — Telephone Encounter (Signed)
Next Visit: 03/24/2021  Last Visit: 12/23/2020  Last Fill: 01/01/2021  Dx: Fibromyalgia  Current Dose per office note on 12/23/2020, Cymbalta 30 mg 1 capsule by mouth daily  Okay to refill Cymbalta?

## 2021-03-13 ENCOUNTER — Encounter: Payer: Self-pay | Admitting: Family Medicine

## 2021-03-16 ENCOUNTER — Ambulatory Visit: Payer: 59 | Admitting: Cardiology

## 2021-03-24 ENCOUNTER — Ambulatory Visit: Payer: 59 | Admitting: Rheumatology

## 2021-03-24 ENCOUNTER — Other Ambulatory Visit: Payer: Self-pay | Admitting: Physician Assistant

## 2021-03-24 DIAGNOSIS — D472 Monoclonal gammopathy: Secondary | ICD-10-CM

## 2021-03-24 DIAGNOSIS — I272 Pulmonary hypertension, unspecified: Secondary | ICD-10-CM

## 2021-03-24 DIAGNOSIS — M62838 Other muscle spasm: Secondary | ICD-10-CM

## 2021-03-24 DIAGNOSIS — I10 Essential (primary) hypertension: Secondary | ICD-10-CM

## 2021-03-24 DIAGNOSIS — R5383 Other fatigue: Secondary | ICD-10-CM

## 2021-03-24 DIAGNOSIS — F5101 Primary insomnia: Secondary | ICD-10-CM

## 2021-03-24 DIAGNOSIS — I5032 Chronic diastolic (congestive) heart failure: Secondary | ICD-10-CM

## 2021-03-24 DIAGNOSIS — I2510 Atherosclerotic heart disease of native coronary artery without angina pectoris: Secondary | ICD-10-CM

## 2021-03-24 DIAGNOSIS — M797 Fibromyalgia: Secondary | ICD-10-CM

## 2021-03-24 DIAGNOSIS — M7061 Trochanteric bursitis, right hip: Secondary | ICD-10-CM

## 2021-03-24 DIAGNOSIS — Z8709 Personal history of other diseases of the respiratory system: Secondary | ICD-10-CM

## 2021-03-24 DIAGNOSIS — Z87891 Personal history of nicotine dependence: Secondary | ICD-10-CM

## 2021-03-24 DIAGNOSIS — Z8669 Personal history of other diseases of the nervous system and sense organs: Secondary | ICD-10-CM

## 2021-03-24 NOTE — Telephone Encounter (Signed)
Next Visit: 03/24/2021  Last Visit: 12/23/2020  Last Fill: 12/23/2020  Dx: Fibromyalgia  Current Dose per office note on 12/23/2020, Zanaflex 4 mg by mouth at bedtime as needed for muscle spasms.  Okay to refill Zanaflex?

## 2021-03-26 DIAGNOSIS — M5416 Radiculopathy, lumbar region: Secondary | ICD-10-CM | POA: Diagnosis not present

## 2021-04-02 DIAGNOSIS — M5459 Other low back pain: Secondary | ICD-10-CM | POA: Diagnosis not present

## 2021-04-24 ENCOUNTER — Ambulatory Visit: Payer: 59 | Admitting: Cardiology

## 2021-04-28 DIAGNOSIS — M5416 Radiculopathy, lumbar region: Secondary | ICD-10-CM | POA: Diagnosis not present

## 2021-05-27 ENCOUNTER — Other Ambulatory Visit: Payer: Self-pay | Admitting: Physician Assistant

## 2021-05-27 ENCOUNTER — Other Ambulatory Visit: Payer: Self-pay | Admitting: Rheumatology

## 2021-05-27 NOTE — Telephone Encounter (Signed)
Next Visit: was due May 2022. Message sent to the front to schedule.    Last Visit: 12/23/2020   Last Fill: 03/12/2021   Dx: Fibromyalgia   Current Dose per office note on 12/23/2020, Cymbalta 30 mg 1 capsule by mouth daily   Okay to refill Cymbalta?

## 2021-05-27 NOTE — Telephone Encounter (Signed)
Please schedule patient for a follow up appointment. Patient was due May 2022. Thanks!

## 2021-05-27 NOTE — Progress Notes (Signed)
Office Visit Note  Patient: Teresa Roberts             Date of Birth: [birth year]           MRN: [id number]             PCP: Jonathon Jordan, MD Referring: Jonathon Jordan, MD Visit Date: 06/01/2021 Occupation: @GUAROCC @  Subjective:  Trapezius muscle tension and tenderness   History of Present Illness: Teresa Roberts is a 60 y.o. female with history of fibromyalgia.  She continues to take cymbalta 60 mg daily.  She continues to have chronic neck and lower back pain and stiffness.  She states she had trigger point injections at her last office visit which provided significant relief for 4 to 5 weeks.  She has started to have muscle spasms on a regular basis and has been taking Skelaxin 400 mg daily and tizanidine 4 mg at bedtime.  She has been sleeping well at night taking trazodone 100 mg at bedtime.  Patient reports that she established care with Dr. Betti Cruz and had an MRI of the lumbar spine.  She states she also had a left SI joint injection which provided relief for a couple of weeks. She denies any other joint pain or joint swelling at this time.  She denies any other new concerns.    Activities of Daily Living:  Patient reports morning stiffness for 2-3 hours.   Patient Reports nocturnal pain.  Difficulty dressing/grooming: Denies Difficulty climbing stairs: Reports Difficulty getting out of chair: Reports Difficulty using hands for taps, buttons, cutlery, and/or writing: Reports  Review of Systems  Constitutional:  Positive for fatigue.  HENT:  Negative for mouth sores, mouth dryness and nose dryness.   Eyes:  Negative for pain, itching and dryness.  Respiratory:  Negative for shortness of breath and difficulty breathing.   Cardiovascular:  Positive for chest pain. Negative for palpitations.  Gastrointestinal:  Negative for blood in stool, constipation and diarrhea.  Endocrine: Negative for increased urination.  Genitourinary:  Negative for difficulty urinating.  Musculoskeletal:   Positive for joint pain, joint pain, myalgias, morning stiffness, muscle tenderness and myalgias. Negative for joint swelling.  Skin:  Negative for color change, rash and redness.  Allergic/Immunologic: Negative for susceptible to infections.  Neurological:  Positive for headaches, memory loss and weakness. Negative for dizziness and numbness.  Hematological:  Positive for bruising/bleeding tendency.  Psychiatric/Behavioral:  Positive for confusion.    PMFS History:  Patient Active Problem List   Diagnosis Date Noted   Personal history of colonic polyps 01/01/2021   Esophageal reflux 01/01/2021   Hypercalcemia 09/23/2020   Hyperparathyroidism, primary ([place]) 08/25/2020   Iron deficiency anemia due to chronic blood loss 06/27/2019   Coronary artery disease involving native coronary artery of native heart without angina pectoris 09/05/2018   Syncope and collapse 09/05/2018   Pain in joint of left elbow 09/04/2018   Chest pain 05/27/2018   HLD (hyperlipidemia) 05/27/2018   Essential hypertension 05/27/2018   Depression 05/27/2018   OSA (obstructive sleep apnea) 05/03/2018   Pulmonary hypertension ([place]) 05/03/2018   Bimalleolar fracture of left ankle 08/12/2017   Acute renal failure (ARF) ([place]) 08/09/2017   Chronic diastolic (congestive) heart failure ([place]) 08/09/2017   Other fatigue 06/13/2017   Primary insomnia 06/13/2017   History of migraine 06/13/2017   COPD (chronic obstructive pulmonary disease) ([place]) 02/15/2017   Tobacco abuse 02/15/2017   Respiratory failure with hypoxia ([place]) 01/28/2017   Migraine 01/28/2017   Daily headache  Hypoxia    Non-ST elevation myocardial infarction (NSTEMI), type 2 01/26/2017   MGUS (monoclonal gammopathy of unknown significance) 01/11/2012   Fibromyalgia 01/11/2012   Bruises easily 01/11/2012    Past Medical History:  Diagnosis Date   CAD (coronary artery disease)    a. 01/2017: cath showing 20% mid RCA stenosis and otherwise normal LAD  and LCx with a preserved EF of 55 to 60%; mod pulmonary HTN   COPD (chronic obstructive pulmonary disease) (HCC)    O2 dependent   cpap    Depression    PTSD   Diastolic CHF ([place])    Fibromyalgia    Hyperlipidemia    Hypertension    Migraines     Family History  Problem Relation Age of Onset   Diabetes Mother    Hypertension Mother    CAD Mother    Diabetes Father    Lung cancer Father    Diabetes Sister    Hypercalcemia Neg Hx    Past Surgical History:  Procedure Laterality Date   APPENDECTOMY     BIOPSY  01/01/2021   Procedure: BIOPSY;  Surgeon: Wilford Corner, MD;  Location: [facility];  Service: Endoscopy;;   CESAREAN SECTION     COLONOSCOPY WITH PROPOFOL N/A 01/01/2021   Procedure: COLONOSCOPY WITH PROPOFOL;  Surgeon: Wilford Corner, MD;  Location: [facility];  Service: Endoscopy;  Laterality: N/A;   ESOPHAGOGASTRODUODENOSCOPY (EGD) WITH PROPOFOL N/A 01/01/2021   Procedure: ESOPHAGOGASTRODUODENOSCOPY (EGD) WITH PROPOFOL;  Surgeon: Wilford Corner, MD;  Location: [facility];  Service: Endoscopy;  Laterality: N/A;   ORIF ANKLE FRACTURE Left 08/12/2017   Procedure: OPEN REDUCTION INTERNAL FIXATION (ORIF) ANKLE FRACTURE;  Surgeon: Rod Can, MD;  Location: [facility];  Service: Orthopedics;  Laterality: Left;   RIGHT HEART CATH N/A 07/19/2018   Procedure: RIGHT HEART CATH;  Surgeon: Jolaine Artist, MD;  Location: [facility];  Service: Cardiovascular;  Laterality: N/A;   RIGHT/LEFT HEART CATH AND CORONARY ANGIOGRAPHY N/A 01/27/2017   Procedure: Right/Left Heart Cath and Coronary Angiography;  Surgeon: Troy Sine, MD;  Location: [facility];  Service: Cardiovascular;  Laterality: N/A;   Social History   Social History Narrative   Not on file   Immunization History  Administered Date(s) Administered   Influenza Split 08/15/2017, 08/28/2018, 08/24/2019   Influenza,inj,Quad PF,6+ Mos 09/04/2018, 08/03/2019, 08/29/2020   Influenza,inj,quad,  With Preservative 08/09/2014   Influenza-Unspecified 08/15/2016   Moderna Sars-Covid-2 Vaccination 02/11/2020, 03/10/2020, 07/11/2020   Pneumococcal Polysaccharide-23 02/04/2014   Pneumococcal-Unspecified 02/13/2013   Tdap 02/04/2014   Zoster, Live 05/31/2019, 08/03/2019     Objective: Vital Signs: BP 114/84 (BP Location: Left Arm, Patient Position: Sitting, Cuff Size: Normal)   Pulse 97   Ht 5\' 6"  (1.676 m)   Wt 162 lb 3.2 oz (73.6 kg)   LMP 08/19/2012   BMI 26.18 kg/m    Physical Exam Vitals and nursing note reviewed.  Constitutional:      Appearance: She is well-developed.  HENT:     Head: Normocephalic and atraumatic.  Eyes:     Conjunctiva/sclera: Conjunctivae normal.  Pulmonary:     Effort: Pulmonary effort is normal.  Abdominal:     Palpations: Abdomen is soft.  Musculoskeletal:     Cervical back: Normal range of motion.  Skin:    General: Skin is warm and dry.     Capillary Refill: Capillary refill takes less than 2 seconds.  Neurological:     Mental Status: She is alert and oriented  to person, place, and time.  Psychiatric:        Behavior: Behavior normal.     Musculoskeletal Exam: Generalized hyperalgesia and positive tender points on exam.  C-spine has good range of motion with discomfort bilaterally.  Trapezius muscle tension and tenderness bilaterally.  Thoracic and lumbar spine have painful range of motion.  Midline spinal tenderness in the lumbar region.  Tenderness over the left SI joint.  Shoulder joint abduction to about 90 degrees.  Tenderness over the deltoid insertion site bilaterally.  Elbow joints, wrist joints, MCPs, PIPs, DIPs have good range of motion with no synovitis.  She was able to make a complete fist bilaterally.  Knee joints have good range of motion with no warmth or effusion.  Ankle joints have good range of motion with no tenderness or joint swelling.   CDAI Exam: CDAI Score: -- Patient Global: --; Provider Global: -- Swollen: --;  Tender: -- Joint Exam 06/01/2021   No joint exam has been documented for this visit   There is currently no information documented on the homunculus. Go to the Rheumatology activity and complete the homunculus joint exam.  Investigation: No additional findings.  Imaging: No results found.  Recent Labs: Lab Results  Component Value Date   WBC 5.2 08/27/2019   HGB 13.3 08/27/2019   PLT 164 08/27/2019   NA 142 08/27/2019   K 2.9 (LL) 08/27/2019   CL 100 08/27/2019   CO2 34 (H) 08/27/2019   GLUCOSE 97 08/27/2019   BUN 12 08/27/2019   CREATININE 1.04 (H) 08/27/2019   BILITOT <0.2 (L) 08/27/2019   ALKPHOS 112 08/27/2019   AST 12 (L) 08/27/2019   ALT 13 08/27/2019   PROT 7.5 08/27/2019   ALBUMIN 3.9 08/27/2019   CALCIUM 10.3 09/23/2020   GFRAA >60 08/27/2019    Speciality Comments: No specialty comments available.  Procedures:  Trigger Point Inj  Date/Time: 06/01/2021 2:18 PM Performed by: Ofilia Neas, PA-C Authorized by: Ofilia Neas, PA-C   Consent Given by:  Patient Site marked: the procedure site was marked   Timeout: prior to procedure the correct patient, procedure, and site was verified   Indications:  Pain Total # of Trigger Points:  2 Location: neck   Needle Size:  27 G Approach:  Dorsal Medications #1:  0.5 mL lidocaine 1 %; 10 mg triamcinolone acetonide 40 MG/ML Medications #2:  0.5 mL lidocaine 1 %; 10 mg triamcinolone acetonide 40 MG/ML Patient tolerance:  Patient tolerated the procedure well with no immediate complications Allergies: Citrus and Lisinopril   Assessment / Plan:     Visit Diagnoses: Fibromyalgia - She has generalized hyperalgesia and positive tender points on examination.  Her myalgias and muscle tenderness have been most severe in bilateral upper extremities.  She presents today with trapezius muscle tension and tenderness bilaterally.  She had trigger point injections on 12/23/2020 which provided significant pain relief for 4 to 5  weeks.  She requested repeat trigger point injections today. She has been taking skelaxin 400 mg by mouth daily and zanaflex 4 mg at bedtime as needed for muscle spasms.  She remains on Cymbalta 30 mg 1 capsule daily.  Referral to physical therapy was placed today which will include hydrotherapy.  We discussed the importance of regular exercise and good sleep hygiene.  She will continue to take trazodone 100 mg at bedtime to help her sleep at night.  She will follow-up in the office in 2 months for repeat trigger point injections  at that time.- Plan: Ambulatory referral to Physical Therapy  Trapezius muscle spasm - She presents today with trapezius muscle tension and muscle tenderness bilaterally.  She has been experiencing muscle spasms on a daily basis.  Her discomfort is most severe on the left side so she experiences some stiffness with lateral rotation.  She requested trigger point injections today.  The trigger point injections performed on 12/23/2020 provided relief for 4 to 5 weeks.  We discussed the importance of performing neck exercises.  Referral to physical therapy was placed today.  She would like to return in 2 months for repeat trigger point injections at that time.  Plan: Ambulatory referral to Physical Therapy  Trochanteric bursitis of both hips: She has occasional discomfort due to trochanter bursitis of both hips.  Referral to physical therapy was placed today.  Other fatigue -Chronic and secondary to insomnia.   Primary insomnia: She takes trazodone 100 mg at bedtime for insomnia.  Neck pain -She has ongoing neck pain and stiffness.  She has good range of motion on examination today with tenderness over bilateral trapezius muscles.  She has been experiencing muscle spasms intermittently.  She takes Skelaxin 400 mg daily and tizanidine 4 mg at bedtime for muscle spasms.  She requested trigger point injections today.  She tolerated the procedure well.  Procedure note was completed above.   Aftercare was discussed.  She will return in 2 months for repeat trigger point injections.  A referral to physical therapy was also placed today.  Plan: Ambulatory referral to Physical Therapy  Chronic left-sided low back pain without sciatica - She established care with Dr. Cay Schillings.  She had an MRI of the lumbar spine.  She also underwent a cortisone injection which provided relief for several weeks but her discomfort has returned.  She is not experiencing any symptoms of radiculopathy at this time.  I discussed that she will benefit from physical therapy so a referral was placed today.  Patient also requests hydrotherapy.  Plan: Ambulatory referral to Physical Therapy  Other medical conditions are listed as follows:   History of migraine  MGUS (monoclonal gammopathy of unknown significance)  History of COPD  Chronic diastolic (congestive) heart failure (HCC)  Coronary artery disease involving native coronary artery of native heart without angina pectoris  Pulmonary hypertension ([place])  Essential hypertension  Former smoker   Orders: Orders Placed This Encounter  Procedures   Trigger Point Inj   Ambulatory referral to Physical Therapy    No orders of the defined types were placed in this encounter.    Follow-Up Instructions: Return in about 2 months (around 08/02/2021) for Fibromyalgia.   Ofilia Neas, PA-C  Note - This record has been created using Dragon software.  Chart creation errors have been sought, but may not always  have been located. Such creation errors do not reflect on  the standard of medical care.

## 2021-06-01 ENCOUNTER — Ambulatory Visit (INDEPENDENT_AMBULATORY_CARE_PROVIDER_SITE_OTHER): Payer: 59 | Admitting: Physician Assistant

## 2021-06-01 ENCOUNTER — Encounter: Payer: Self-pay | Admitting: Physician Assistant

## 2021-06-01 ENCOUNTER — Other Ambulatory Visit: Payer: Self-pay

## 2021-06-01 VITALS — BP 114/84 | HR 97 | Ht 66.0 in | Wt 162.2 lb

## 2021-06-01 DIAGNOSIS — Z87891 Personal history of nicotine dependence: Secondary | ICD-10-CM

## 2021-06-01 DIAGNOSIS — Z8669 Personal history of other diseases of the nervous system and sense organs: Secondary | ICD-10-CM

## 2021-06-01 DIAGNOSIS — D472 Monoclonal gammopathy: Secondary | ICD-10-CM | POA: Diagnosis not present

## 2021-06-01 DIAGNOSIS — G8929 Other chronic pain: Secondary | ICD-10-CM

## 2021-06-01 DIAGNOSIS — I272 Pulmonary hypertension, unspecified: Secondary | ICD-10-CM

## 2021-06-01 DIAGNOSIS — M797 Fibromyalgia: Secondary | ICD-10-CM | POA: Diagnosis not present

## 2021-06-01 DIAGNOSIS — F5101 Primary insomnia: Secondary | ICD-10-CM | POA: Diagnosis not present

## 2021-06-01 DIAGNOSIS — I5032 Chronic diastolic (congestive) heart failure: Secondary | ICD-10-CM | POA: Diagnosis not present

## 2021-06-01 DIAGNOSIS — I2510 Atherosclerotic heart disease of native coronary artery without angina pectoris: Secondary | ICD-10-CM | POA: Diagnosis not present

## 2021-06-01 DIAGNOSIS — Z8709 Personal history of other diseases of the respiratory system: Secondary | ICD-10-CM | POA: Diagnosis not present

## 2021-06-01 DIAGNOSIS — M7061 Trochanteric bursitis, right hip: Secondary | ICD-10-CM | POA: Diagnosis not present

## 2021-06-01 DIAGNOSIS — M545 Low back pain, unspecified: Secondary | ICD-10-CM

## 2021-06-01 DIAGNOSIS — I10 Essential (primary) hypertension: Secondary | ICD-10-CM

## 2021-06-01 DIAGNOSIS — R5383 Other fatigue: Secondary | ICD-10-CM

## 2021-06-01 DIAGNOSIS — M62838 Other muscle spasm: Secondary | ICD-10-CM

## 2021-06-01 DIAGNOSIS — M542 Cervicalgia: Secondary | ICD-10-CM

## 2021-06-01 DIAGNOSIS — M7062 Trochanteric bursitis, left hip: Secondary | ICD-10-CM

## 2021-06-01 MED ORDER — TRIAMCINOLONE ACETONIDE 40 MG/ML IJ SUSP
10.0000 mg | INTRAMUSCULAR | Status: AC | PRN
  Administered 2021-06-01: 10 mg via INTRAMUSCULAR

## 2021-06-01 MED ORDER — LIDOCAINE HCL 1 % IJ SOLN
0.5000 mL | INTRAMUSCULAR | Status: AC | PRN
  Administered 2021-06-01: .5 mL

## 2021-06-11 ENCOUNTER — Other Ambulatory Visit: Payer: Self-pay | Admitting: Physician Assistant

## 2021-06-11 NOTE — Telephone Encounter (Signed)
Next Visit: 08/03/2021  Last Visit: 06/01/2021  Last Fill: 03/24/2021  Dx: Fibromyalgia   Current Dose per office note on 06/01/2021: zanaflex 4 mg at bedtime as needed for muscle spasms  Okay to refill Zanaflex?

## 2021-06-25 ENCOUNTER — Telehealth (HOSPITAL_BASED_OUTPATIENT_CLINIC_OR_DEPARTMENT_OTHER): Payer: Self-pay | Admitting: Cardiology

## 2021-06-25 NOTE — Telephone Encounter (Signed)
Left message for patient to call and schedule overdue 6 month follow up with Dr. Harrell Gave

## 2021-07-02 ENCOUNTER — Ambulatory Visit (HOSPITAL_BASED_OUTPATIENT_CLINIC_OR_DEPARTMENT_OTHER): Payer: 59 | Attending: Physician Assistant | Admitting: Physical Therapy

## 2021-07-02 ENCOUNTER — Other Ambulatory Visit: Payer: Self-pay

## 2021-07-02 ENCOUNTER — Encounter (HOSPITAL_BASED_OUTPATIENT_CLINIC_OR_DEPARTMENT_OTHER): Payer: Self-pay | Admitting: Physical Therapy

## 2021-07-02 VITALS — BP 105/77 | HR 74

## 2021-07-02 DIAGNOSIS — M545 Low back pain, unspecified: Secondary | ICD-10-CM | POA: Diagnosis not present

## 2021-07-02 DIAGNOSIS — M6281 Muscle weakness (generalized): Secondary | ICD-10-CM | POA: Insufficient documentation

## 2021-07-02 DIAGNOSIS — M542 Cervicalgia: Secondary | ICD-10-CM | POA: Insufficient documentation

## 2021-07-02 NOTE — Therapy (Addendum)
[facility] [address] [place], [place], [address] Phone: [phone]   Fax:  [phone]  Physical Therapy Evaluation/Discharge  Patient Details  Name: Teresa Roberts MRN: [id number] Date of Birth: May 28, 1961 Referring Provider (PT): Ofilia Neas, PA-C   Encounter Date: 07/02/2021   PT End of Session - 07/02/21 1533     Visit Number 1    Number of Visits 17    Date for PT Re-Evaluation 09/30/21    Authorization Type U[employer]    PT Start Time 0321    PT Stop Time 1600    PT Time Calculation (min) 45 min    Activity Tolerance Patient limited by pain    Behavior During Therapy [facility] for tasks assessed/performed             Past Medical History:  Diagnosis Date   CAD (coronary artery disease)    a. 01/2017: cath showing 20% mid RCA stenosis and otherwise normal LAD and LCx with a preserved EF of 55 to 60%; mod pulmonary HTN   COPD (chronic obstructive pulmonary disease) ([place])    O2 dependent   cpap    Depression    PTSD   Diastolic CHF ([place])    Fibromyalgia    Hyperlipidemia    Hypertension    Migraines     Past Surgical History:  Procedure Laterality Date   APPENDECTOMY     BIOPSY  01/01/2021   Procedure: BIOPSY;  Surgeon: Wilford Corner, MD;  [facility];  Service: Endoscopy;;   CESAREAN SECTION     COLONOSCOPY WITH PROPOFOL N/A 01/01/2021   Procedure: COLONOSCOPY WITH PROPOFOL;  Surgeon: Wilford Corner, MD;  [facility];  Service: Endoscopy;  Laterality: N/A;   ESOPHAGOGASTRODUODENOSCOPY (EGD) WITH PROPOFOL N/A 01/01/2021   Procedure: ESOPHAGOGASTRODUODENOSCOPY (EGD) WITH PROPOFOL;  Surgeon: Wilford Corner, MD;  [facility];  Service: Endoscopy;  Laterality: N/A;   ORIF ANKLE FRACTURE Left 08/12/2017   Procedure: OPEN REDUCTION INTERNAL FIXATION (ORIF) ANKLE FRACTURE;  Surgeon: Rod Can, MD;  Location: [facility];  Service: Orthopedics;  Laterality: Left;   RIGHT HEART  CATH N/A 07/19/2018   Procedure: RIGHT HEART CATH;  Surgeon: Jolaine Artist, MD;  Location: [facility];  Service: Cardiovascular;  Laterality: N/A;   RIGHT/LEFT HEART CATH AND CORONARY ANGIOGRAPHY N/A 01/27/2017   Procedure: Right/Left Heart Cath and Coronary Angiography;  Surgeon: Troy Sine, MD;  Location: [facility];  Service: Cardiovascular;  Laterality: N/A;    Vitals:   07/02/21 1535  BP: 105/77  Pulse: 74      Subjective Assessment - 07/02/21 1521     Subjective Pt states that the major pains she has are neck, leg, and back pain. Pt states the TP injections from MD visit lasted about 3 weeks and pain is back now. She states she has had fibromyalgia since 2014. Pt states she requires assistance with dressing, grooming, and opening things. Pt states she is in constant pain and she has constant hand pain and cramping.  She states that movement in general hurts her neck and back. She states she has been recently having spasms across her ribs and middle back and backs of her legs. Current 6/10, Worst 10/10, Best 5/10. Pt states she is in constant pain and she has constant hand pain and will cramp. Aggs: any and all movement; Eases: medications. Pt states she is currently not performing exercises for her neck or back. Pt states she has been wearing  oxygen and requires 4L at rest. Pt states she is able to take oxygen off as needed. "I can take it off at my discretion."  Pt states she is very sedentary throughout the day. She currently lays down for at least 4 hours out of the day. Pt denies unexplained weight loss but endorses night pain. Pt denies BB changes. Pt does endorse drop attacks and NT bilaterally. Functional deficits: getting out of bed (pain, motivation, and difficulty), walking, transfers. Pt is currently not seeking professional attention for history of depression.    Pertinent History COPD, CHF, HTN, depression, syncope    How long can you sit comfortably? 30  mins    Diagnostic tests Impression: Unremarkable x-ray of the lumbar spine; MRI taken- results not available    Patient Stated Goals Pt states she would like to reduce pain and improve her mobility.    Currently in Pain? Yes    Pain Score 6     Pain Location Neck    Pain Orientation Posterior    Pain Descriptors / Indicators Aching;Sore    Multiple Pain Sites Yes    Pain Score 6    Pain Location Back    Pain Orientation Lower    Pain Descriptors / Indicators Aching;Sore;Spasm                [facility] PT Assessment - 07/02/21 0001       Assessment   Medical Diagnosis M79.7 (ICD-10-CM) - Fibromyalgia  M62.838 (ICD-10-CM) - Trapezius muscle spasm  M54.2 (ICD-10-CM) - Neck pain  M54.50,G89.29 (ICD-10-CM) - Chronic left-sided low back pain without sciatica    Referring Provider (PT) Ofilia Neas, PA-C      Precautions   Precautions None      Restrictions   Weight Bearing Restrictions No      Balance Screen   Has the patient fallen in the past 6 months No    Has the patient had a decrease in activity level because of a fear of falling?  No    Is the patient reluctant to leave their home because of a fear of falling?  No      Home Environment   Living Environment Private residence    Living Arrangements Alone      Prior Function   Level of Independence Needs assistance with ADLs      Cognition   Overall Cognitive Status Within Functional Limits for tasks assessed      Observation/Other Assessments   Other Surveys  Oswestry Disability Index;Neck Disability Index    Oswestry Disability Index  Oswestry Score: 31 / 50 or 62 %    Neck Disability Index  Neck Disability Index score: 33 / 50 = 66.0 %      Sensation   Light Touch Impaired by gross assessment    Additional Comments LE      Coordination   Gross Motor Movements are Fluid and Coordinated Yes      Functional Tests   Functional tests Sit to Stand      Posture/Postural Control   Posture/Postural Control  Postural limitations    Postural Limitations Increased thoracic kyphosis;Rounded Shoulders;Forward head;Posterior pelvic tilt      ROM / Strength   AROM / PROM / Strength AROM;Strength      AROM   Overall AROM Comments C/S WFL, bilat shoulders not above 90 deg of flexion or ABD, painful behind back IR and ER reach to 60%, L/S grossly limited 40% by all directions due to  pain   C/S all painful on R with AROM     Strength   Overall Strength Comments UE 4/5 bilat grossly, L4 myotomal weakness on L; 4-/5 through bilat hips; 4/5 bilat knee extension   painful into mid back and low back     Palpation   Palpation comment TTP midback and lumbar paraspinals      Transfers   Five time sit to stand comments  Unable to perform without bilat UE      Ambulation/Gait   Ambulation Distance (Feet) 90 Feet    Gait Pattern Decreased stride length;Trunk flexed    Gait Comments No SOB, dyspnea, diaphoresis on RA; unable to obtain O2 saturation numbers due to pt nail coloring                        Objective measurements completed on examination: See above findings.       [facility] Adult PT Treatment/Exercise - 07/02/21 0001       Exercises   Exercises Shoulder;Lumbar      Lumbar Exercises: Stretches   Lower Trunk Rotation Limitations 15x      Shoulder Exercises: Seated   Retraction Limitations 2x10                    PT Education - 07/02/21 1532     Education Details MOI, diagnosis, prognosis, anatomy, exercise progression, progressive muscle relaxation, pain neuroscience, HEP, POC    Person(s) Educated Patient    Methods Explanation;Tactile cues;Demonstration;Verbal cues;Handout    Comprehension Verbalized understanding;Returned demonstration;Verbal cues required;Tactile cues required              PT Short Term Goals - 07/02/21 1839       PT SHORT TERM GOAL #1   Title Pt will become independent with HEP in order to demonstrate synthesis of PT education.     Time 2    Period Weeks    Status New      PT SHORT TERM GOAL #2   Title Pt will be able to demonstrate UE reach above 90 deg in order to demonstrate functional improvement in UE function for self-care and house hold duties.    Time 4    Period Weeks    Status New      PT SHORT TERM GOAL #3   Title Pt will demonstrate at least a 12.8 improvement in Oswestry Index in order to demonstrate a clinically significant change in LBP and function.    Time 4    Period Weeks    Status New      PT SHORT TERM GOAL #4   Title Pt will demonstrate at least a 7.5 improvement in Neck Disability Index in order to demonstrate a clinically significant change in neck pain and function.    Time 4    Period Weeks    Status New      PT SHORT TERM GOAL #5   Title Pt will be able to demonstrate full sit to stand without hands in order to demonstrate functional improvement in LE function for self-care and house hold duties.    Time 4    Period Weeks    Status New               PT Long Term Goals - 07/02/21 1842       PT LONG TERM GOAL #1   Title Pt will become independent with HEP in order to demonstrate synthesis of PT  education.    Time 8    Period Weeks    Status New      PT LONG TERM GOAL #2   Title Pt will be able to report resting pain below 3/10 in order to demonstrate improvement in pain on VAS scale.    Time 8    Period Weeks    Status New      PT LONG TERM GOAL #3   Title Pt will be able to demonstrate/report ability to sit/stand for extended periods of time without pain in order to demonstrate functional improvement and tolerance to static positioning.    Time 8    Period Weeks    Status New      PT LONG TERM GOAL #4   Title Pt will be able to perform 5XSTS in under 12s  in order to demonstrate functional improvement above the cut off score for adults.    Time 8    Period Weeks    Status New      PT LONG TERM GOAL #5   Title Pt will be able to reach [facility] and  grab/reach/hold >2 lbs in order to demonstrate functional improvement in L/R UE function.    Time 8    Period Weeks    Status New                    Plan - 07/02/21 1511     Clinical Impression Statement Pt is a 60 y.o female presenting to PT eval today for CC of generalized chronic pain. Pt presents with pain limited ROM globally, generalized UE and LE weakness, decreased endurance, and difficulty with functional mobility. Pt's s/s are consistent with signficant PMH (including but not limited to) CHF, HTN, COPD, and chronic pain. Pt is a good candidate for physical therapy. However, baseline use of oxgen and report of frequent sudden onset bilateral LE weakness is contraindicated for aquatic therapy. Pt reports havigng had MRI of lumbar spine, but unclear of results/findings. Pt may be able to progress towards aquatic therapy for pain management, but is a better land candidate at this time. Pt does appear motivated to participate with skilled therapy. Pt would likely also benefit from psych referral in order to address psychosocial aspects of chronic pain and current depression. Pt's impairment limits their participation with ADL and daily mobility. Pt would benefit from continued skilled therapy in order to reach goals and maximize functional UE and LE strength and mobility for prevention of further functional decline.    Personal Factors and Comorbidities Behavior Pattern;Fitness;Time since onset of injury/illness/exacerbation;Comorbidity 3+    Comorbidities COPD, CHF, HLD, depression, HTN    Examination-Activity Limitations Transfers;Bed Mobility;Reach Overhead;Sit;Sleep;Stairs;Squat;Stand;Lift;Dressing;Carry    Examination-Participation Restrictions Other;Community Activity;Driving;Shop;Laundry;Cleaning    Stability/Clinical Decision Making Evolving/Moderate complexity    Clinical Decision Making Moderate    Rehab Potential Fair    PT Frequency 2x / week    PT Duration 8 weeks    PT  Treatment/Interventions ADLs/Self Care Home Management;Aquatic Therapy;Cryotherapy;Electrical Stimulation;Iontophoresis 79m/ml Dexamethasone;Moist Heat;Traction;Ultrasound;Gait training;Stair training;Functional mobility training;Therapeutic activities;Therapeutic exercise;Balance training;Neuromuscular re-education;Patient/family education;Manual techniques;Passive range of motion;Energy conservation;Dry needling;Taping;Vasopneumatic Device;Spinal Manipulations;Joint Manipulations    PT Next Visit Plan gentle lumbar stretching, nustep, pulley, scap rolls, neck stretching    PT Home Exercise Plan Access Code: [email or web address]  URL: [email or web address]  Date: 07/02/2021  Prepared by: ADaleen Bo   Exercises  Seated Scapular Retraction - 1 x daily - 7 x weekly - 2 sets - 10 reps  Supine Lower  Trunk Rotation - 1 x daily - 7 x weekly - 2 sets - 10 reps    Recommended Other Services Psych consult for depression management    Consulted and Agree with Plan of Care Patient             Patient will benefit from skilled therapeutic intervention in order to improve the following deficits and impairments:  Abnormal gait, Decreased range of motion, Difficulty walking, Impaired UE functional use, Increased muscle spasms, Decreased endurance, Decreased activity tolerance, Hypermobility, Decreased mobility, Decreased strength, Impaired sensation, Improper body mechanics, Impaired flexibility, Hypomobility, Decreased balance, Pain  Visit Diagnosis: Muscle weakness (generalized)  Cervicalgia  Pain, lumbar region     Problem List Patient Active Problem List   Diagnosis Date Noted   Personal history of colonic polyps 01/01/2021   Esophageal reflux 01/01/2021   Hypercalcemia 09/23/2020   Hyperparathyroidism, primary ([place]) 08/25/2020   Iron deficiency anemia due to chronic blood loss 06/27/2019   Coronary artery disease involving native coronary artery of native heart without angina pectoris  09/05/2018   Syncope and collapse 09/05/2018   Pain in joint of left elbow 09/04/2018   Chest pain 05/27/2018   HLD (hyperlipidemia) 05/27/2018   Essential hypertension 05/27/2018   Depression 05/27/2018   OSA (obstructive sleep apnea) 05/03/2018   Pulmonary hypertension ([place]) 05/03/2018   Bimalleolar fracture of left ankle 08/12/2017   Acute renal failure (ARF) ([place]) 08/09/2017   Chronic diastolic (congestive) heart failure ([place]) 08/09/2017   Other fatigue 06/13/2017   Primary insomnia 06/13/2017   History of migraine 06/13/2017   COPD (chronic obstructive pulmonary disease) ([place]) 02/15/2017   Tobacco abuse 02/15/2017   Respiratory failure with hypoxia ([place]) 01/28/2017   Migraine 01/28/2017   Daily headache    Hypoxia    Non-ST elevation myocardial infarction (NSTEMI), type 2 01/26/2017   MGUS (monoclonal gammopathy of unknown significance) 01/11/2012   Fibromyalgia 01/11/2012   Bruises easily 01/11/2012    Daleen Bo PT, DPT 07/02/21 7:07 PM   [facility] [address] [place], [place], [address] Phone: [phone]   Fax:  [phone]  Name: Teresa Roberts MRN: [id number] Date of Birth: 1961/09/14  PHYSICAL THERAPY NO VISIT DISCHARGE SUMMARY  Visits from Start of Care: 1  Plan: Patient goals were not met. Patient is being discharged due to not returning to PT.

## 2021-07-02 NOTE — Patient Instructions (Signed)
Access Code: [email or web address] URL: [email or web address] Date: 07/02/2021 Prepared by: Daleen Bo  Exercises Seated Scapular Retraction - 1 x daily - 7 x weekly - 2 sets - 10 reps Supine Lower Trunk Rotation - 1 x daily - 7 x weekly - 2 sets - 10 reps

## 2021-07-08 ENCOUNTER — Telehealth (HOSPITAL_BASED_OUTPATIENT_CLINIC_OR_DEPARTMENT_OTHER): Payer: Self-pay | Admitting: Physical Therapy

## 2021-07-08 NOTE — Telephone Encounter (Signed)
Spoke to pt in regard to scheduling PT visits. Pt advised to start PT on land and transition to aquatic therapy as able depending up response to land based exercise. Awaiting imaging information to determine possible causes for sudden LE weakness. Pt agreeable to starting PT on land and transitioning to aquatic therapy as tolerated.   Daleen Bo PT, DPT 07/08/21 10:46 AM

## 2021-07-21 NOTE — Progress Notes (Deleted)
Office Visit Note  Patient: Teresa Roberts             Date of Birth: [birth year]           MRN: [id number]             PCP: Jonathon Jordan, MD Referring: Jonathon Jordan, MD Visit Date: 08/03/2021 Occupation: '@GUAROCC'$ @  Subjective:  No chief complaint on file.   History of Present Illness: Teresa Roberts is a 60 y.o. female ***   Activities of Daily Living:  Patient reports morning stiffness for *** {minute/hour:19697}.   Patient {ACTIONS;DENIES/REPORTS:21021675::"Denies"} nocturnal pain.  Difficulty dressing/grooming: {ACTIONS;DENIES/REPORTS:21021675::"Denies"} Difficulty climbing stairs: {ACTIONS;DENIES/REPORTS:21021675::"Denies"} Difficulty getting out of chair: {ACTIONS;DENIES/REPORTS:21021675::"Denies"} Difficulty using hands for taps, buttons, cutlery, and/or writing: {ACTIONS;DENIES/REPORTS:21021675::"Denies"}  No Rheumatology ROS completed.   PMFS History:  Patient Active Problem List   Diagnosis Date Noted   Personal history of colonic polyps 01/01/2021   Esophageal reflux 01/01/2021   Hypercalcemia 09/23/2020   Hyperparathyroidism, primary ([place]) 08/25/2020   Iron deficiency anemia due to chronic blood loss 06/27/2019   Coronary artery disease involving native coronary artery of native heart without angina pectoris 09/05/2018   Syncope and collapse 09/05/2018   Pain in joint of left elbow 09/04/2018   Chest pain 05/27/2018   HLD (hyperlipidemia) 05/27/2018   Essential hypertension 05/27/2018   Depression 05/27/2018   OSA (obstructive sleep apnea) 05/03/2018   Pulmonary hypertension ([place]) 05/03/2018   Bimalleolar fracture of left ankle 08/12/2017   Acute renal failure (ARF) ([place]) 08/09/2017   Chronic diastolic (congestive) heart failure ([place]) 08/09/2017   Other fatigue 06/13/2017   Primary insomnia 06/13/2017   History of migraine 06/13/2017   COPD (chronic obstructive pulmonary disease) ([place]) 02/15/2017   Tobacco abuse 02/15/2017   Respiratory failure with  hypoxia ([place]) 01/28/2017   Migraine 01/28/2017   Daily headache    Hypoxia    Non-ST elevation myocardial infarction (NSTEMI), type 2 01/26/2017   MGUS (monoclonal gammopathy of unknown significance) 01/11/2012   Fibromyalgia 01/11/2012   Bruises easily 01/11/2012    Past Medical History:  Diagnosis Date   CAD (coronary artery disease)    a. 01/2017: cath showing 20% mid RCA stenosis and otherwise normal LAD and LCx with a preserved EF of 55 to 60%; mod pulmonary HTN   COPD (chronic obstructive pulmonary disease) ([place])    O2 dependent   cpap    Depression    PTSD   Diastolic CHF ([place])    Fibromyalgia    Hyperlipidemia    Hypertension    Migraines     Family History  Problem Relation Age of Onset   Diabetes Mother    Hypertension Mother    CAD Mother    Diabetes Father    Lung cancer Father    Diabetes Sister    Hypercalcemia Neg Hx    Past Surgical History:  Procedure Laterality Date   APPENDECTOMY     BIOPSY  01/01/2021   Procedure: BIOPSY;  Surgeon: Wilford Corner, MD;  [facility];  Service: Endoscopy;;   CESAREAN SECTION     COLONOSCOPY WITH PROPOFOL N/A 01/01/2021   Procedure: COLONOSCOPY WITH PROPOFOL;  Surgeon: Wilford Corner, MD;  [facility];  Service: Endoscopy;  Laterality: N/A;   ESOPHAGOGASTRODUODENOSCOPY (EGD) WITH PROPOFOL N/A 01/01/2021   Procedure: ESOPHAGOGASTRODUODENOSCOPY (EGD) WITH PROPOFOL;  Surgeon: Wilford Corner, MD;  [facility];  Service: Endoscopy;  Laterality: N/A;   ORIF ANKLE FRACTURE Left 08/12/2017   Procedure: OPEN REDUCTION INTERNAL FIXATION (ORIF) ANKLE  FRACTURE;  Surgeon: Rod Can, MD;  Location: [facility];  Service: Orthopedics;  Laterality: Left;   RIGHT HEART CATH N/A 07/19/2018   Procedure: RIGHT HEART CATH;  Surgeon: Jolaine Artist, MD;  Location: [facility];  Service: Cardiovascular;  Laterality: N/A;   RIGHT/LEFT HEART CATH AND CORONARY ANGIOGRAPHY N/A 01/27/2017    Procedure: Right/Left Heart Cath and Coronary Angiography;  Surgeon: Troy Sine, MD;  Location: [facility];  Service: Cardiovascular;  Laterality: N/A;   Social History   Social History Narrative   Not on file   Immunization History  Administered Date(s) Administered   Influenza Split 08/15/2017, 08/28/2018, 08/24/2019   Influenza,inj,Quad PF,6+ Mos 09/04/2018, 08/03/2019, 08/29/2020   Influenza,inj,quad, With Preservative 08/09/2014   Influenza-Unspecified 08/15/2016   Moderna Sars-Covid-2 Vaccination 02/11/2020, 03/10/2020, 07/11/2020   Pneumococcal Polysaccharide-23 02/04/2014   Pneumococcal-Unspecified 02/13/2013   Tdap 02/04/2014   Zoster, Live 05/31/2019, 08/03/2019     Objective: Vital Signs: LMP 08/19/2012    Physical Exam   Musculoskeletal Exam: ***  CDAI Exam: CDAI Score: -- Patient Global: --; Provider Global: -- Swollen: --; Tender: -- Joint Exam 08/03/2021   No joint exam has been documented for this visit   There is currently no information documented on the homunculus. Go to the Rheumatology activity and complete the homunculus joint exam.  Investigation: No additional findings.  Imaging: No results found.  Recent Labs: Lab Results  Component Value Date   WBC 5.2 08/27/2019   HGB 13.3 08/27/2019   PLT 164 08/27/2019   NA 142 08/27/2019   K 2.9 (LL) 08/27/2019   CL 100 08/27/2019   CO2 34 (H) 08/27/2019   GLUCOSE 97 08/27/2019   BUN 12 08/27/2019   CREATININE 1.04 (H) 08/27/2019   BILITOT <0.2 (L) 08/27/2019   ALKPHOS 112 08/27/2019   AST 12 (L) 08/27/2019   ALT 13 08/27/2019   PROT 7.5 08/27/2019   ALBUMIN 3.9 08/27/2019   CALCIUM 10.3 09/23/2020   GFRAA >60 08/27/2019    Speciality Comments: No specialty comments available.  Procedures:  No procedures performed Allergies: Citrus and Lisinopril   Assessment / Plan:     Visit Diagnoses: No diagnosis found.  Orders: No orders of the defined types were placed in this  encounter.  No orders of the defined types were placed in this encounter.   Face-to-face time spent with patient was *** minutes. Greater than 50% of time was spent in counseling and coordination of care.  Follow-Up Instructions: No follow-ups on file.   Earnestine Mealing, CMA  Note - This record has been created using Editor, commissioning.  Chart creation errors have been sought, but may not always  have been located. Such creation errors do not reflect on  the standard of medical care.

## 2021-07-23 ENCOUNTER — Other Ambulatory Visit: Payer: Self-pay

## 2021-07-23 ENCOUNTER — Ambulatory Visit (INDEPENDENT_AMBULATORY_CARE_PROVIDER_SITE_OTHER): Payer: 59 | Admitting: Cardiology

## 2021-07-23 ENCOUNTER — Encounter (HOSPITAL_BASED_OUTPATIENT_CLINIC_OR_DEPARTMENT_OTHER): Payer: Self-pay | Admitting: Cardiology

## 2021-07-23 VITALS — BP 122/72 | HR 75 | Ht 66.0 in | Wt 175.0 lb

## 2021-07-23 DIAGNOSIS — I272 Pulmonary hypertension, unspecified: Secondary | ICD-10-CM

## 2021-07-23 DIAGNOSIS — Z716 Tobacco abuse counseling: Secondary | ICD-10-CM | POA: Diagnosis not present

## 2021-07-23 DIAGNOSIS — I10 Essential (primary) hypertension: Secondary | ICD-10-CM

## 2021-07-23 DIAGNOSIS — I5032 Chronic diastolic (congestive) heart failure: Secondary | ICD-10-CM | POA: Diagnosis not present

## 2021-07-23 DIAGNOSIS — Z720 Tobacco use: Secondary | ICD-10-CM

## 2021-07-23 DIAGNOSIS — I2510 Atherosclerotic heart disease of native coronary artery without angina pectoris: Secondary | ICD-10-CM | POA: Diagnosis not present

## 2021-07-23 DIAGNOSIS — J9611 Chronic respiratory failure with hypoxia: Secondary | ICD-10-CM | POA: Diagnosis not present

## 2021-07-23 NOTE — Progress Notes (Signed)
Cardiology Office Note:    Date:  07/23/2021   ID:  Teresa Roberts, DOB [birth year], MRN [id number]  PCP:  Teresa Jordan, MD  Cardiologist:  Teresa Dresser, MD PhD  Referring MD: Teresa Jordan, MD   CC: follow up  History of Present Illness:    Teresa Roberts is a 60 y.o. female with a hx of fibromyalgia, hypertension, COPD on home O2, chronic diastolic heart failure, nononbstructive CAD who is seen in follow up. I met her 07/11/2018 at the request of Dr. Stephanie Roberts for evaluation and management of shortness of breath.  Today: Has sharp left lateral/left under breast pain that happens a few times/day. Lasts about 30 seconds. Better if she holds/rubs the area. Tender on palpation today.  Breathing is stable. No further syncope. No issues with swelling.  Still smoking, about 1/2 ppd. We have discussed dangers of this with her COPD and use of home O2.  Denies PND, orthopnea, LE edema or unexpected weight gain. No syncope or palpitations.    Past Medical History:  Diagnosis Date   CAD (coronary artery disease)    a. 01/2017: cath showing 20% mid RCA stenosis and otherwise normal LAD and LCx with a preserved EF of 55 to 60%; mod pulmonary HTN   COPD (chronic obstructive pulmonary disease) ([place])    O2 dependent   cpap    Depression    PTSD   Diastolic CHF ([place])    Fibromyalgia    Hyperlipidemia    Hypertension    Migraines     Past Surgical History:  Procedure Laterality Date   APPENDECTOMY     BIOPSY  01/01/2021   Procedure: BIOPSY;  Surgeon: Teresa Corner, MD;  Location: [facility];  Service: Endoscopy;;   CESAREAN SECTION     COLONOSCOPY WITH PROPOFOL N/A 01/01/2021   Procedure: COLONOSCOPY WITH PROPOFOL;  Surgeon: Teresa Corner, MD;  Location: [facility];  Service: Endoscopy;  Laterality: N/A;   ESOPHAGOGASTRODUODENOSCOPY (EGD) WITH PROPOFOL N/A 01/01/2021   Procedure: ESOPHAGOGASTRODUODENOSCOPY (EGD) WITH PROPOFOL;  Surgeon: Teresa Corner, MD;  Location: [facility];  Service: Endoscopy;  Laterality: N/A;   ORIF ANKLE FRACTURE Left 08/12/2017   Procedure: OPEN REDUCTION INTERNAL FIXATION (ORIF) ANKLE FRACTURE;  Surgeon: Teresa Can, MD;  Location: [facility];  Service: Orthopedics;  Laterality: Left;   RIGHT HEART CATH N/A 07/19/2018   Procedure: RIGHT HEART CATH;  Surgeon: Teresa Artist, MD;  Location: [facility];  Service: Cardiovascular;  Laterality: N/A;   RIGHT/LEFT HEART CATH AND CORONARY ANGIOGRAPHY N/A 01/27/2017   Procedure: Right/Left Heart Cath and Coronary Angiography;  Surgeon: Teresa Sine, MD;  Location: [facility];  Service: Cardiovascular;  Laterality: N/A;    Current Medications: Current Outpatient Medications on File Prior to Visit  Medication Sig   tiZANidine (ZANAFLEX) 4 MG tablet TAKE 1 TABLET BY MOUTH AT BEDTIME AS NEEDED FOR MUSCLE SPASMS.   albuterol (PROVENTIL) (2.5 MG/3ML) 0.083% nebulizer solution Take 3 mLs (2.5 mg total) by nebulization every 6 (six) hours as needed for wheezing or shortness of breath.   albuterol (VENTOLIN HFA) 108 (90 Base) MCG/ACT inhaler Inhale 2 puffs into the lungs every 6 (six) hours as needed for wheezing or shortness of breath.   aspirin 81 MG EC tablet Take 81 mg by mouth daily after lunch.   busPIRone (BUSPAR) 10 MG tablet Take 10 mg by mouth 2 (two) times daily.   dorzolamide-timolol (COSOPT) 22.3-6.8 MG/ML ophthalmic solution Place 1 drop into both eyes 2 (two)  times daily.   DULoxetine (CYMBALTA) 30 MG capsule TAKE 1 CAPSULE BY MOUTH EVERYDAY AT BEDTIME   Fluticasone-Umeclidin-Vilant (TRELEGY ELLIPTA) 100-62.5-25 MCG/INH AEPB Inhale 1 puff into the lungs daily.   folic acid (FOLVITE) 1 MG tablet Take 1 mg by mouth daily after lunch.   furosemide (LASIX) 40 MG tablet Take 1 tablet (40 mg total) by mouth daily.   gabapentin (NEURONTIN) 800 MG tablet Take 800 mg by mouth 2 (two) times daily.   latanoprost (XALATAN) 0.005 % ophthalmic solution Place 1 drop into both eyes  at bedtime.   metaxalone (SKELAXIN) 400 MG tablet Take 1 tablet (400 mg total) by mouth daily as needed.   Multiple Vitamins-Minerals (HAIR/SKIN/NAILS/BIOTIN) TABS Take 1 tablet by mouth daily after lunch.    omeprazole (PRILOSEC) 20 MG capsule Take by mouth as needed.   OXYGEN Inhale 4 L into the lungs continuous.   rizatriptan (MAXALT-MLT) 10 MG disintegrating tablet Take 10 mg by mouth as needed for migraine. May repeat in 2 hours if needed   SAXENDA 18 MG/3ML SOPN 0.6 MG SQ ONCE A DAY X1 WEEK, THEN INCREASE DOSE BY 0.6 MG/DAY EVERY WEEK (MAX  1.8 MG/DAY) DAILY   simvastatin (ZOCOR) 10 MG tablet Take 10 mg by mouth at bedtime.   topiramate (TOPAMAX) 25 MG tablet Take 50 mg by mouth as needed.    traZODone (DESYREL) 100 MG tablet Take 100 mg by mouth at bedtime.   vitamin B-12 (CYANOCOBALAMIN) 1000 MCG tablet Take 1,000 mcg by mouth daily after lunch.   No current facility-administered medications on file prior to visit.     Allergies:   Citrus and Lisinopril   Social History   Tobacco Use   Smoking status: Every Day    Packs/day: 1.00    Years: 39.00    Pack years: 39.00    Types: Cigarettes    Start date: 07/21/2018   Smokeless tobacco: Never  Vaping Use   Vaping Use: Never used  Substance Use Topics   Alcohol use: No   Drug use: Yes    Types: Marijuana    Comment: occ    Family History: The patient's family history includes CAD in her mother; Diabetes in her father, mother, and sister; Hypertension in her mother; Lung cancer in her father. There is no history of Hypercalcemia. No family history of use of oxygen, no heart failure.  ROS:   Please see the history of present illness.  Additional pertinent ROS otherwise unremarkable.  EKGs/Labs/Other Studies Reviewed:    The following studies were reviewed today: Echo 03/26/20  1. Left ventricular ejection fraction, by estimation, is 55 to 60%. The  left ventricle has normal function. The left ventricle has no regional   wall motion abnormalities. Left ventricular diastolic parameters are  consistent with Grade II diastolic  dysfunction (pseudonormalization).   2. Right ventricular systolic function is normal. The right ventricular  size is normal.   3. The mitral valve is grossly normal. Trivial mitral valve  regurgitation.   4. The aortic valve is tricuspid. Aortic valve regurgitation is not  visualized.   5. The inferior vena cava is normal in size with <50% respiratory  variability, suggesting right atrial pressure of 8 mmHg.   Comparison(s): Changes from prior study are noted. 05/28/2018: LVEF 50-55%.   [place] 07/19/18 Findings:  RA = 13 RV = 52/14 PA = 50/19 (33) PCW = 16 Fick cardiac output/index = 4.7/2.3 PVR = 3.6 WU Ao sat = 97% PA sat = 64%,  64% SVC sat = 72%    Assessment:  1. Mild PAH with mild RV strain. Left-sided pressures and cardiac output look good 2. No evidence of intracardiac shunting   Plan/Discussion:  Would continue with conservative measures including CPAP and weight loss. If pressures worsening Roberts consider selective pulmonary artery vasodilator down the road.   R/LHC 01/27/17 Mid RCA lesion, 20 %stenosed. The left ventricular ejection fraction is 55-65% by visual estimate. The left ventricular systolic function is normal. LV end diastolic pressure is normal.   Moderate right sided heart pressure elevation with moderate pulmonary hypertension.  Normal systolic function with moderate left ventricular hypertrophy with EF estimate of approximately 60%.  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   RECOMMENDATION: Medical therapy  Right Heart Pressures RA: A wave 15, V-wave 13, mean 12 RV: 55/14 PA: 55/19 PW: mean16  AO: 134/72 PA: 55/19; mean 33  LV: 131/18 PW: a 19; v 17; mean 16  LV: 114/17 AO: 114/66; mean 84  Oxygen saturation 92% in the aorta and 68% in the pulmonary  artery.  Cardiac output by the thermodilution method 6.5 and by the Fick method 6.2 L/m. Cardiac index 3.6 and 3.3 L/m/m, respectively   Echo 05/28/18 Study Conclusions   - Left ventricle: The cavity size was normal. Wall thickness was   increased in a pattern of mild LVH. Systolic function was normal.   The estimated ejection fraction was in the range of 50% to 55%.   Wall motion was normal; there were no regional wall motion   abnormalities. Indeterminate diastolic function. - Aortic valve: Mildly calcified annulus. Trileaflet. - Mitral valve: Mildly calcified annulus. There was trivial   regurgitation. - Left atrium: The atrium was at the upper limits of normal in   size. - Right ventricle: The cavity size was moderately dilated. Systolic   function was severely reduced. - Right atrium: The atrium was mildly dilated. Central venous   pressure (est): 3 mm Hg. - Tricuspid valve: There was mild regurgitation. - Pulmonary arteries: PA peak pressure: 25 mm Hg (S). - Pericardium, extracardiac: A small pericardial effusion was   identified posterior to the heart.  EKG:  ECG personally reviewed today. 07/23/21 NSR at 75 bpm  Recent Labs: No results found for requested labs within last 8760 hours.  Recent Lipid Panel    Component Value Date/Time   CHOL 144 05/28/2018 0629   TRIG 147 05/28/2018 0629   HDL 31 (L) 05/28/2018 0629   CHOLHDL 4.6 05/28/2018 0629   VLDL 29 05/28/2018 0629   LDLCALC 84 05/28/2018 0629    Physical Exam:    VS:  BP 122/72   Pulse 75   Ht 5' 6"  (1.676 m)   Wt 175 lb (79.4 kg)   LMP 08/19/2012   BMI 28.25 kg/m     Wt Readings from Last 3 Encounters:  07/23/21 175 lb (79.4 kg)  06/01/21 162 lb 3.2 oz (73.6 kg)  01/01/21 170 lb (77.1 kg)    GEN: Well nourished, well developed in no acute distress. [place] for O2 in place HEENT: Normal, moist mucous membranes NECK: No JVD at 90 degrees CARDIAC: regular rhythm, normal S1 and S2, no rubs or gallops. No  murmur. VASCULAR: Radial and DP pulses 2+ bilaterally. No carotid bruits RESPIRATORY:  Distant but clear breath sounds ABDOMEN: Soft, non-tender, non-distended MUSCULOSKELETAL:  Ambulates independently SKIN: Warm and dry, no edema NEUROLOGIC:  Alert and oriented  x 3. No focal neuro deficits noted. PSYCHIATRIC:  Normal affect    ASSESSMENT:    1. Chronic diastolic (congestive) heart failure (HCC)   2. Pulmonary hypertension ([employer])   3. Chronic respiratory failure with hypoxia (HCC)   4. Essential hypertension   5. Tobacco abuse   6. Tobacco abuse counseling   7. Nonocclusive coronary atherosclerosis of native coronary artery     PLAN:    History of syncope:  -has not recurred -ECG, echo were unrevealing for a cardiac cause  Chronic diastolic heart failure, Mild Pulmonary hypertension in the setting of COPD and chronic hypoxic respiratory failure requiring home O2, OSA on CPAP: -euvolemic on exam -RHC consistent with mild-moderate pulmonary hypertension driven by her 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  -continue management of her underlying lung disease -stable on dosing of furosemide, euvolemic today -on chronic home O2 -on daily lasix, continue  Hypertension: well controlled today, below goal of <130/80 -on only lasix  Chest pain: atypical, low risk features.  -Nonobstructive cad on cath in 2018 -high suspicion that this is MSK/fibromyalgia related given features -no further workup at this time -instructed on red flag symptoms that need immediate medical attention  Nonobstructive asymptomatic CAD: -continue aspirin 81 mg -on simvastatin 10 mg. No longer on amlodipine (low BP, but has interaction risk with simvastatin) -discussed data re: triptans and CAD/MI risk. She understands, wishes to continue current therapy  Tobacco abuse, with cessation counseling: The patient was counseled on tobacco cessation today for 3 minutes.  Counseling included reviewing the risks of smoking tobacco  products, how it impacts the patient's current medical diagnoses and different strategies for quitting.  Pharmacotherapy to aid in tobacco cessation was not prescribed today. Counseled hat smoking while on oxygen is life threatening.  CV risk counseling and primary prevention: -recommend heart healthy/Mediterranean diet, with whole grains, fruits, vegetable, fish, lean meats, nuts, and olive oil. Limit salt. -recommend moderate walking, 3-5 times/week for 30-50 minutes each session. Aim for at least 150 minutes.week. Goal should be pace of 3 miles/hours, or walking 1.5 miles in 30 minutes -recommend avoidance of tobacco products. Avoid excess alcohol.  Plan for follow up: 1 year or sooner PRN  Medication Adjustments/Labs and Tests Ordered: Current medicines are reviewed at length with the patient today.  Concerns regarding medicines are outlined above.  Orders Placed This Encounter  Procedures   EKG 12-Lead    No orders of the defined types were placed in this encounter.   Patient Instructions  Medication Instructions:  Your Physician recommend you continue on your current medication as directed.    *If you need a refill on your cardiac medications before your next appointment, please call your pharmacy*   Lab Work: None ordered today   Testing/Procedures: None ordered today   Follow-Up: At [facility], you and your health needs are our priority.  As part of our continuing mission to provide you with exceptional heart care, we have created designated Provider Care Teams.  These Care Teams include your primary Cardiologist (physician) and Advanced Practice Providers (APPs -  Physician Assistants and Nurse Practitioners) who all work together to provide you with the care you need, when you need it.  We recommend signing up for the patient portal called "MyChart".  Sign up information is provided on this After Visit Summary.  MyChart is used to connect with patients for Virtual  Visits (Telemedicine).  Patients are able to view lab/test results, encounter notes, upcoming appointments, etc.  Non-urgent messages Roberts  be sent to your provider as well.   To learn more about what you can do with MyChart, go to [email or web address].    Your next appointment:   1 year(s)  The format for your next appointment:   In Person  Provider:   Buford Dresser, MD    Signed, Teresa Dresser, MD PhD 07/23/2021     [place]

## 2021-07-23 NOTE — Patient Instructions (Signed)

## 2021-07-28 ENCOUNTER — Encounter (HOSPITAL_BASED_OUTPATIENT_CLINIC_OR_DEPARTMENT_OTHER): Payer: Self-pay | Admitting: Cardiology

## 2021-07-29 DIAGNOSIS — E538 Deficiency of other specified B group vitamins: Secondary | ICD-10-CM | POA: Diagnosis not present

## 2021-07-29 DIAGNOSIS — R1010 Upper abdominal pain, unspecified: Secondary | ICD-10-CM | POA: Diagnosis not present

## 2021-07-30 DIAGNOSIS — M25552 Pain in left hip: Secondary | ICD-10-CM | POA: Diagnosis not present

## 2021-07-30 DIAGNOSIS — M5442 Lumbago with sciatica, left side: Secondary | ICD-10-CM | POA: Diagnosis not present

## 2021-08-03 ENCOUNTER — Ambulatory Visit: Payer: 59 | Admitting: Rheumatology

## 2021-08-03 ENCOUNTER — Other Ambulatory Visit: Payer: Self-pay

## 2021-08-03 ENCOUNTER — Ambulatory Visit (INDEPENDENT_AMBULATORY_CARE_PROVIDER_SITE_OTHER): Payer: 59 | Admitting: Pulmonary Disease

## 2021-08-03 ENCOUNTER — Encounter: Payer: Self-pay | Admitting: Pulmonary Disease

## 2021-08-03 VITALS — BP 124/76 | HR 73 | Temp 97.4°F | Ht 68.0 in | Wt 169.0 lb

## 2021-08-03 DIAGNOSIS — Z23 Encounter for immunization: Secondary | ICD-10-CM

## 2021-08-03 DIAGNOSIS — Z720 Tobacco use: Secondary | ICD-10-CM | POA: Diagnosis not present

## 2021-08-03 DIAGNOSIS — J432 Centrilobular emphysema: Secondary | ICD-10-CM

## 2021-08-03 DIAGNOSIS — J9611 Chronic respiratory failure with hypoxia: Secondary | ICD-10-CM

## 2021-08-03 DIAGNOSIS — M542 Cervicalgia: Secondary | ICD-10-CM

## 2021-08-03 DIAGNOSIS — M545 Low back pain, unspecified: Secondary | ICD-10-CM

## 2021-08-03 DIAGNOSIS — Z8709 Personal history of other diseases of the respiratory system: Secondary | ICD-10-CM

## 2021-08-03 DIAGNOSIS — I272 Pulmonary hypertension, unspecified: Secondary | ICD-10-CM

## 2021-08-03 DIAGNOSIS — D472 Monoclonal gammopathy: Secondary | ICD-10-CM

## 2021-08-03 DIAGNOSIS — I10 Essential (primary) hypertension: Secondary | ICD-10-CM

## 2021-08-03 DIAGNOSIS — I2510 Atherosclerotic heart disease of native coronary artery without angina pectoris: Secondary | ICD-10-CM

## 2021-08-03 DIAGNOSIS — Z8669 Personal history of other diseases of the nervous system and sense organs: Secondary | ICD-10-CM

## 2021-08-03 DIAGNOSIS — M62838 Other muscle spasm: Secondary | ICD-10-CM

## 2021-08-03 DIAGNOSIS — M7061 Trochanteric bursitis, right hip: Secondary | ICD-10-CM

## 2021-08-03 DIAGNOSIS — I5032 Chronic diastolic (congestive) heart failure: Secondary | ICD-10-CM

## 2021-08-03 DIAGNOSIS — M797 Fibromyalgia: Secondary | ICD-10-CM

## 2021-08-03 DIAGNOSIS — F5101 Primary insomnia: Secondary | ICD-10-CM

## 2021-08-03 DIAGNOSIS — Z87891 Personal history of nicotine dependence: Secondary | ICD-10-CM

## 2021-08-03 DIAGNOSIS — R5383 Other fatigue: Secondary | ICD-10-CM

## 2021-08-03 DIAGNOSIS — G4733 Obstructive sleep apnea (adult) (pediatric): Secondary | ICD-10-CM

## 2021-08-03 LAB — PULMONARY FUNCTION TEST
DL/VA % pred: 49 %
DL/VA: 2.08 ml/min/mmHg/L
DLCO cor % pred: 31 %
DLCO cor: 6.53 ml/min/mmHg
DLCO unc % pred: 31 %
DLCO unc: 6.53 ml/min/mmHg
FEF 25-75 Post: 0.47 L/sec
FEF 25-75 Pre: 0.38 L/sec
FEF2575-%Change-Post: 22 %
FEF2575-%Pred-Post: 21 %
FEF2575-%Pred-Pre: 17 %
FEV1-%Change-Post: 7 %
FEV1-%Pred-Post: 47 %
FEV1-%Pred-Pre: 43 %
FEV1-Post: 1.04 L
FEV1-Pre: 0.97 L
FEV1FVC-%Change-Post: 2 %
FEV1FVC-%Pred-Pre: 66 %
FEV6-%Change-Post: 3 %
FEV6-%Pred-Post: 66 %
FEV6-%Pred-Pre: 64 %
FEV6-Post: 1.81 L
FEV6-Pre: 1.75 L
FEV6FVC-%Change-Post: -1 %
FEV6FVC-%Pred-Post: 97 %
FEV6FVC-%Pred-Pre: 99 %
FVC-%Change-Post: 5 %
FVC-%Pred-Post: 68 %
FVC-%Pred-Pre: 64 %
FVC-Post: 1.91 L
FVC-Pre: 1.82 L
Post FEV1/FVC ratio: 55 %
Post FEV6/FVC ratio: 95 %
Pre FEV1/FVC ratio: 53 %
Pre FEV6/FVC Ratio: 96 %
RV % pred: 198 %
RV: 4 L
TLC % pred: 113 %
TLC: 5.93 L

## 2021-08-03 MED ORDER — TRELEGY ELLIPTA 100-62.5-25 MCG/INH IN AEPB: 1.0000 | INHALATION_SPRAY | Freq: Every day | RESPIRATORY_TRACT | 0 refills | Status: DC

## 2021-08-03 MED ORDER — NICOTINE 10 MG IN INHA: 1.0000 | RESPIRATORY_TRACT | 4 refills | Status: DC | PRN

## 2021-08-03 NOTE — Progress Notes (Signed)
   Subjective:    Patient ID: Teresa Roberts, female    DOB: Mar 19, 1961, 60 y.o.   MRN: [id number]  HPI  60 yo heavy smoker for FU of COPD, pulmonary hypertension and chronic respiratory failure with hypoxia - Hypoxia quite out of proportion to degree of airway obstruction or pulmonary hypertension -She uses 5 L pulse on exertion She smoked > 30-pack-years  PMH - Takes trazodone for sleep and modafinil in the daytime   1 year follow-up visit, accompanied by her mom who corroborates symptoms. Reports dyspnea is at baseline, no interim exacerbations. She had COVID infection 08/2020 and was sick for a few days but did not require hospitalization. She continues to smoke 10 cigarettes daily.  She is using 3 L pulse on her Inogen POC and uses 5 L continuous at home. Compliant with Lasix, denies pedal edema.  She requests Trelegy sample but is compliant for the most part with this We reviewed PFTs today  Significant tests/ events reviewed  CT angiogram 01/2017  neg for pulmonary embolism .  ABG showed mild hypercarbia with 7.36/45/62 and 7.33/51/70 .    Echo 03/2020 nml LVEF, gr2 DD , RVSF nml Cath 07/2018  PA 50/19, PVR 3.6 WU, RA13   PFTs 07/2021 showed drop in FEV1 to 0.97/43%, no bronchodilator response, TLC maintained, DLCO 31%/6.53 Spirometry 02/2017  ratio of 69, FEV1 of 1.15-52% and FVC of 60%   NPSG ( [facility]) which per report showed AHI of 16/hour >> auto CPAP 12-20 cm>> avg 16 cm  Review of Systems neg for any significant sore throat, dysphagia, itching, sneezing, nasal congestion or excess/ purulent secretions, fever, chills, sweats, unintended wt loss, pleuritic or exertional cp, hempoptysis, orthopnea pnd or change in chronic leg swelling. Also denies presyncope, palpitations, heartburn, abdominal pain, nausea, vomiting, diarrhea or change in bowel or urinary habits, dysuria,hematuria, rash, arthralgias, visual complaints, headache, numbness weakness or ataxia.     Objective:    Physical Exam  Gen. Pleasant, obese, in no distress ENT - no lesions, no post nasal drip Neck: No JVD, no thyromegaly, no carotid bruits Lungs: no use of accessory muscles, no dullness to percussion, decreased without rales or rhonchi  Cardiovascular: Rhythm regular, heart sounds  normal, no murmurs or gallops, no peripheral edema Musculoskeletal: No deformities, no cyanosis or clubbing , no tremors       Assessment & Plan:

## 2021-08-03 NOTE — Progress Notes (Signed)
PFT done today. 

## 2021-08-03 NOTE — Assessment & Plan Note (Signed)
Smoking cessation was again emphasized she is not ready to make a quit attempt but is willing to trial nicotine patch and Nicotrol inhaler again we will send prescription accordingly Dangers of smoking and oxygen were emphasized

## 2021-08-03 NOTE — Patient Instructions (Signed)
Ambulatory saturation on 3 L pulse. Referral to pulmonary rehab. Sample of Trelegy 100 Prescription for Nicotrol inhaler #150 -you have to quit smoking !!

## 2021-08-03 NOTE — Assessment & Plan Note (Addendum)
Refills will be provided on Trelegy. She will use albuterol on a as needed basis Lung function has dropped and this is likely related to ongoing smoking she does not seem to have intercurrent exacerbation

## 2021-08-03 NOTE — Assessment & Plan Note (Signed)
On ambulation, she was not able to maintain saturations on 3 L pulse or even after increasing to 5 L pulse and required 3 L continuous. I have emphasized the need to continue 3l continuous on ambulation. We will also refer her to pulmonary rehab

## 2021-08-04 NOTE — Progress Notes (Signed)
Office Visit Note  Patient: Teresa Roberts             Date of Birth: 1961-08-23           MRN: [id number]             PCP: Jonathon Jordan, MD Referring: Jonathon Jordan, MD Visit Date: 08/18/2021 Occupation: @GUAROCC @  Subjective:  Neck pain.   History of Present Illness: Samiya Mervin is a 60 y.o. female with a history of fibromyalgia.  She states she has been having discomfort in bilateral trapezius region.  She also has discomfort in bilateral trochanteric area.  She was seen by orthopedic surgeon for lower back pain.  According to patient she had a cortisone injection about 1 month ago which helped.  Patient states she went for water therapy and after the evaluation she was advised to get physical therapy and then transition to water therapy.  She has not heard back from physical therapy.  Activities of Daily Living:  Patient reports morning stiffness for 2 hours.   Patient Reports nocturnal pain.  Difficulty dressing/grooming: Reports Difficulty climbing stairs: Reports Difficulty getting out of chair: Reports Difficulty using hands for taps, buttons, cutlery, and/or writing: Reports  Review of Systems  Constitutional:  Positive for fatigue.  HENT:  Positive for mouth dryness and nose dryness. Negative for mouth sores.   Eyes:  Negative for pain, itching and dryness.  Respiratory:  Negative for shortness of breath and difficulty breathing.   Cardiovascular:  Negative for chest pain and palpitations.  Gastrointestinal:  Negative for blood in stool, constipation and diarrhea.  Endocrine: Negative for increased urination.  Genitourinary:  Negative for difficulty urinating.  Musculoskeletal:  Positive for myalgias, morning stiffness, muscle tenderness and myalgias. Negative for joint pain, joint pain and joint swelling.  Skin:  Negative for color change, rash and redness.  Allergic/Immunologic: Negative for susceptible to infections.  Neurological:  Positive for dizziness, memory  loss and weakness. Negative for numbness and headaches.  Hematological:  Positive for bruising/bleeding tendency.  Psychiatric/Behavioral:  Positive for confusion.    PMFS History:  Patient Active Problem List   Diagnosis Date Noted   Personal history of colonic polyps 01/01/2021   Esophageal reflux 01/01/2021   Hypercalcemia 09/23/2020   Hyperparathyroidism, primary ([place]) 08/25/2020   Iron deficiency anemia due to chronic blood loss 06/27/2019   Coronary artery disease involving native coronary artery of native heart without angina pectoris 09/05/2018   Syncope and collapse 09/05/2018   Pain in joint of left elbow 09/04/2018   Chest pain 05/27/2018   HLD (hyperlipidemia) 05/27/2018   Essential hypertension 05/27/2018   Depression 05/27/2018   OSA (obstructive sleep apnea) 05/03/2018   Pulmonary hypertension ([place]) 05/03/2018   Bimalleolar fracture of left ankle 08/12/2017   Acute renal failure (ARF) ([place]) 08/09/2017   Chronic diastolic (congestive) heart failure ([place]) 08/09/2017   Other fatigue 06/13/2017   Primary insomnia 06/13/2017   History of migraine 06/13/2017   COPD (chronic obstructive pulmonary disease) ([place]) 02/15/2017   Tobacco abuse 02/15/2017   Respiratory failure with hypoxia ([place]) 01/28/2017   Migraine 01/28/2017   Daily headache    Hypoxia    Non-ST elevation myocardial infarction (NSTEMI), type 2 01/26/2017   MGUS (monoclonal gammopathy of unknown significance) 01/11/2012   Fibromyalgia 01/11/2012   Bruises easily 01/11/2012    Past Medical History:  Diagnosis Date   CAD (coronary artery disease)    a. 01/2017: cath showing 20% mid RCA stenosis and otherwise normal LAD  and LCx with a preserved EF of 55 to 60%; mod pulmonary HTN   COPD (chronic obstructive pulmonary disease) (HCC)    O2 dependent   cpap    Depression    PTSD   Diastolic CHF ([place])    Fibromyalgia    Hyperlipidemia    Hypertension    Migraines     Family History  Problem Relation  Age of Onset   Diabetes Mother    Hypertension Mother    CAD Mother    Diabetes Father    Lung cancer Father    Diabetes Sister    Hypercalcemia Neg Hx    Past Surgical History:  Procedure Laterality Date   APPENDECTOMY     BIOPSY  01/01/2021   Procedure: BIOPSY;  Surgeon: Wilford Corner, MD;  [facility]: [facility];  Service: Endoscopy;;   CESAREAN SECTION     COLONOSCOPY WITH PROPOFOL N/A 01/01/2021   Procedure: COLONOSCOPY WITH PROPOFOL;  Surgeon: Wilford Corner, MD;  [facility];  Service: Endoscopy;  Laterality: N/A;   ESOPHAGOGASTRODUODENOSCOPY (EGD) WITH PROPOFOL N/A 01/01/2021   Procedure: ESOPHAGOGASTRODUODENOSCOPY (EGD) WITH PROPOFOL;  Surgeon: Wilford Corner, MD;  [facility];  Service: Endoscopy;  Laterality: N/A;   ORIF ANKLE FRACTURE Left 08/12/2017   Procedure: OPEN REDUCTION INTERNAL FIXATION (ORIF) ANKLE FRACTURE;  Surgeon: Rod Can, MD;  Location: [place];  Service: Orthopedics;  Laterality: Left;   RIGHT HEART CATH N/A 07/19/2018   Procedure: RIGHT HEART CATH;  Surgeon: Jolaine Artist, MD;  Location: [place] [facility];  Service: Cardiovascular;  Laterality: N/A;   RIGHT/LEFT HEART CATH AND CORONARY ANGIOGRAPHY N/A 01/27/2017   Procedure: Right/Left Heart Cath and Coronary Angiography;  Surgeon: Troy Sine, MD;  Location: [facility] [facility];  Service: Cardiovascular;  Laterality: N/A;   Social History   Social History Narrative   Not on file   Immunization History  Administered Date(s) Administered   Influenza Split 08/15/2017, 08/28/2018, 08/24/2019   Influenza,inj,Quad PF,6+ Mos 12/28/2016, 09/28/2017, 09/04/2018, 08/03/2019, 08/29/2020, 08/03/2021   Influenza,inj,quad, With Preservative 08/09/2014   Influenza-Unspecified 08/15/2016   Moderna Sars-Covid-2 Vaccination 02/11/2020, 03/10/2020, 07/11/2020   Pneumococcal Polysaccharide-23 02/04/2014, 08/31/2016   Pneumococcal-Unspecified 02/13/2013   Tdap 04/25/2013,  02/04/2014   Zoster, Live 05/31/2019, 08/03/2019     Objective: Vital Signs: BP 137/90 (BP Location: Left Arm, Patient Position: Sitting, Cuff Size: Normal)   Pulse 81   Ht 5\' 5"  (1.651 m)   Wt 170 lb (77.1 kg)   LMP 08/19/2012   BMI 28.29 kg/m    Physical Exam Vitals and nursing note reviewed.  Constitutional:      Appearance: She is well-developed.  HENT:     Head: Normocephalic and atraumatic.  Eyes:     Conjunctiva/sclera: Conjunctivae normal.  Cardiovascular:     Rate and Rhythm: Normal rate and regular rhythm.     Heart sounds: Normal heart sounds.  Pulmonary:     Effort: Pulmonary effort is normal.     Breath sounds: Normal breath sounds.  Abdominal:     General: Bowel sounds are normal.     Palpations: Abdomen is soft.  Musculoskeletal:     Cervical back: Normal range of motion.  Lymphadenopathy:     Cervical: No cervical adenopathy.  Skin:    General: Skin is warm and dry.     Capillary Refill: Capillary refill takes less than 2 seconds.  Neurological:     Mental Status: She is alert and oriented to person, place, and time.  Psychiatric:        Behavior: Behavior normal.     Musculoskeletal Exam: C-spine was in good range of motion.  She had bilateral trapezius spasm.  She had discomfort range of motion of her lumbar spine.  She had tenderness over bilateral trapezius region.  Shoulder joints, elbow joints, wrist joints were in good range of motion with discomfort.  She had no tenderness over wrist joints, MCPs, PIPs or DIPs.  She had good range of motion of her hip joints, knee joints, ankles.  There was no tenderness over ankles or MTPs.  CDAI Exam: CDAI Score: -- Patient Global: --; Provider Global: -- Swollen: --; Tender: -- Joint Exam 08/18/2021   No joint exam has been documented for this visit   There is currently no information documented on the homunculus. Go to the Rheumatology activity and complete the homunculus joint  exam.  Investigation: No additional findings.  Imaging: No results found.  Recent Labs: Lab Results  Component Value Date   WBC 5.2 08/27/2019   HGB 13.3 08/27/2019   PLT 164 08/27/2019   NA 142 08/27/2019   K 2.9 (LL) 08/27/2019   CL 100 08/27/2019   CO2 34 (H) 08/27/2019   GLUCOSE 97 08/27/2019   BUN 12 08/27/2019   CREATININE 1.04 (H) 08/27/2019   BILITOT <0.2 (L) 08/27/2019   ALKPHOS 112 08/27/2019   AST 12 (L) 08/27/2019   ALT 13 08/27/2019   PROT 7.5 08/27/2019   ALBUMIN 3.9 08/27/2019   CALCIUM 10.3 09/23/2020   GFRAA >60 08/27/2019    Speciality Comments: No specialty comments available.  Procedures:  Trigger Point Inj  Date/Time: 08/18/2021 3:13 PM Performed by: Bo Merino, MD Authorized by: Bo Merino, MD   Consent Given by:  Patient Site marked: the procedure site was marked   Timeout: prior to procedure the correct patient, procedure, and site was verified   Indications:  Muscle spasm and pain Total # of Trigger Points:  2 Location: neck   Needle Size:  27 G Approach:  Dorsal Medications #1:  0.5 mL lidocaine 1 %; 10 mg triamcinolone acetonide 40 MG/ML Medications #2:  0.5 mL lidocaine 1 %; 10 mg triamcinolone acetonide 40 MG/ML Patient tolerance:  Patient tolerated the procedure well with no immediate complications Allergies: Citrus and Lisinopril   Assessment / Plan:     Visit Diagnoses: Fibromyalgia -she is having a flare of fibromyalgia with increased pain and discomfort.  She is on skelaxin 400 mg by mouth daily and zanaflex 4 mg at bedtime as needed for muscle spasms.  Trapezius muscle spasm - trigger point injections on 06/01/2021.  She had good response to trigger point injections.  She states the discomfort Back and she would like to have repeat injections.  Side effects of the cortisone injections were discussed.  Per her request bilateral trapezius area were injected with cortisone as described above.  Trochanteric bursitis  of both hips-IT band stretches were discussed.  Other fatigue-she continues to have fatigue from fibromyalgia and insomnia.  Primary insomnia -*insomnia symptoms are better on trazodone 100 mg at bedtime.  Chronic left-sided low back pain without sciatica - She established care with Dr. Cay Schillings.  She had an MRI of the lumbar spine.  Patient states she had lumbar spine injection which was helpful.  History of migraine  History of COPD-she is followed by Dr. Andres Ege.  Recent PFTs showed decline in her lung function which she felt was related to smoking.  She is a still  on oxygen 3 L per nasal cannula.  She will be going to pulmonary rehab.  Coronary artery disease involving native coronary artery of native heart without angina pectoris  MGUS (monoclonal gammopathy of unknown significance)  Chronic diastolic (congestive) heart failure (HCC)-she is followed by Dr. Shawna Orleans.  Pulmonary hypertension (HCC)  Essential hypertension-blood pressure is mildly elevated today.  Former smoker  Orders: Orders Placed This Encounter  Procedures   Trigger Point Inj    No orders of the defined types were placed in this encounter.    Follow-Up Instructions: Return in about 6 months (around 02/16/2022) for OA, FMS.   Bo Merino, MD  Note - This record has been created using Editor, commissioning.  Chart creation errors have been sought, but may not always  have been located. Such creation errors do not reflect on  the standard of medical care.

## 2021-08-05 ENCOUNTER — Encounter (HOSPITAL_COMMUNITY): Payer: Self-pay | Admitting: *Deleted

## 2021-08-05 NOTE — Progress Notes (Signed)
Received referral from Dr. Elsworth Soho for this pt to participate in pulmonary rehab with the diagnosis of Chronic Respiratory Failure with Hypoxia.  Clinical review of pt follow up appt on   9/19 Pulmonary office note.  Pt with Covid Risk Score - 4. Pt appropriate for scheduling for Pulmonary rehab.  Will forward to support staff for scheduling when able as their is a wait list and verification of insurance eligibility/benefits with pt consent. Cherre Huger, BSN Cardiac and Training and development officer

## 2021-08-14 ENCOUNTER — Ambulatory Visit: Payer: 59 | Admitting: Endocrinology

## 2021-08-14 ENCOUNTER — Other Ambulatory Visit: Payer: Self-pay | Admitting: Physician Assistant

## 2021-08-14 ENCOUNTER — Other Ambulatory Visit: Payer: Self-pay | Admitting: Acute Care

## 2021-08-14 ENCOUNTER — Other Ambulatory Visit: Payer: Self-pay | Admitting: Rheumatology

## 2021-08-14 DIAGNOSIS — J432 Centrilobular emphysema: Secondary | ICD-10-CM

## 2021-08-14 NOTE — Telephone Encounter (Signed)
Next Visit: 08/03/2021   Last Visit: 06/01/2021   Last Fill: 05/27/2021  Dx: Fibromyalgia    Current Dose per office note on 06/01/2021:Cymbalta 30 mg 1 capsule daily  Okay to refill Cymbalta?

## 2021-08-14 NOTE — Telephone Encounter (Signed)
Next Visit: 08/18/2021   Last Visit: 06/01/2021   Last Fill: 06/11/2021  Dx: Fibromyalgia    Current Dose per office note on 06/01/2021:zanaflex 4 mg at bedtime as needed for muscle spasms   Okay to refill Zanaflex?

## 2021-08-17 ENCOUNTER — Encounter: Payer: Self-pay | Admitting: Endocrinology

## 2021-08-17 ENCOUNTER — Other Ambulatory Visit: Payer: Self-pay

## 2021-08-17 ENCOUNTER — Ambulatory Visit (INDEPENDENT_AMBULATORY_CARE_PROVIDER_SITE_OTHER): Payer: 59 | Admitting: Endocrinology

## 2021-08-17 DIAGNOSIS — Z720 Tobacco use: Secondary | ICD-10-CM | POA: Diagnosis not present

## 2021-08-17 DIAGNOSIS — I2510 Atherosclerotic heart disease of native coronary artery without angina pectoris: Secondary | ICD-10-CM

## 2021-08-17 NOTE — Progress Notes (Signed)
Subjective:    Patient ID: Teresa Roberts, female    DOB: Jan 02, 1961, 60 y.o.   MRN: [id number]  HPI Pt returns for f/u of hypercalcemia (dx'ed 2019; PTH was high, but both improved with vit-D supplementation; she had had these bony fractures: left ankle and left elbow (both with injuries, and both in 2019).  She takes Vit-D, 1000 units/d.  pt states she feels well in general, except for muscle weakness.     Past Medical History:  Diagnosis Date   CAD (coronary artery disease)    a. 01/2017: cath showing 20% mid RCA stenosis and otherwise normal LAD and LCx with a preserved EF of 55 to 60%; mod pulmonary HTN   COPD (chronic obstructive pulmonary disease) ([place])    O2 dependent   cpap    Depression    PTSD   Diastolic CHF ([place])    Fibromyalgia    Hyperlipidemia    Hypertension    Migraines     Past Surgical History:  Procedure Laterality Date   APPENDECTOMY     BIOPSY  01/01/2021   Procedure: BIOPSY;  Surgeon: Wilford Corner, MD;  [facility];  Service: Endoscopy;;   CESAREAN SECTION     COLONOSCOPY WITH PROPOFOL N/A 01/01/2021   Procedure: COLONOSCOPY WITH PROPOFOL;  Surgeon: Wilford Corner, MD;  [facility];  Service: Endoscopy;  Laterality: N/A;   ESOPHAGOGASTRODUODENOSCOPY (EGD) WITH PROPOFOL N/A 01/01/2021   Procedure: ESOPHAGOGASTRODUODENOSCOPY (EGD) WITH PROPOFOL;  Surgeon: Wilford Corner, MD;  [facility];  Service: Endoscopy;  Laterality: N/A;   ORIF ANKLE FRACTURE Left 08/12/2017   Procedure: OPEN REDUCTION INTERNAL FIXATION (ORIF) ANKLE FRACTURE;  Surgeon: Rod Can, MD;  Location: [place];  Service: Orthopedics;  Laterality: Left;   RIGHT HEART CATH N/A 07/19/2018   Procedure: RIGHT HEART CATH;  Surgeon: Jolaine Artist, MD;  Location: [facility] [facility];  Service: Cardiovascular;  Laterality: N/A;   RIGHT/LEFT HEART CATH AND CORONARY ANGIOGRAPHY N/A 01/27/2017   Procedure: Right/Left Heart Cath and Coronary Angiography;   Surgeon: Troy Sine, MD;  Location: [facility];  Service: Cardiovascular;  Laterality: N/A;    Social History   Socioeconomic History   Marital status: Married    Spouse name: Not on file   Number of children: Not on file   Years of education: Not on file   Highest education level: Not on file  Occupational History   Not on file  Tobacco Use   Smoking status: Every Day    Packs/day: 1.00    Years: 39.00    Pack years: 39.00    Types: Cigarettes    Start date: 07/21/2018   Smokeless tobacco: Never   Tobacco comments:    10 cigarettes smoked daily   Vaping Use   Vaping Use: Never used  Substance and Sexual Activity   Alcohol use: No   Drug use: Yes    Types: Marijuana    Comment: occ   Sexual activity: Not on file  Other Topics Concern   Not on file  Social History Narrative   Not on file   Social Determinants of Health   Financial Resource Strain: Not on file  Food Insecurity: Not on file  Transportation Needs: Not on file  Physical Activity: Not on file  Stress: Not on file  Social Connections: Not on file  Intimate Partner Violence: Not on file    Current Outpatient Medications on File Prior to Visit  Medication Sig Dispense Refill  tiZANidine (ZANAFLEX) 4 MG tablet TAKE 1 TABLET BY MOUTH AT BEDTIME AS NEEDED FOR MUSCLE SPASMS. 90 tablet 0   albuterol (PROVENTIL) (2.5 MG/3ML) 0.083% nebulizer solution Take 3 mLs (2.5 mg total) by nebulization every 6 (six) hours as needed for wheezing or shortness of breath. 360 mL 5   albuterol (VENTOLIN HFA) 108 (90 Base) MCG/ACT inhaler Inhale 2 puffs into the lungs every 6 (six) hours as needed for wheezing or shortness of breath. 18 g 5   aspirin 81 MG EC tablet Take 81 mg by mouth daily after lunch.  0   busPIRone (BUSPAR) 10 MG tablet Take 10 mg by mouth 2 (two) times daily.     dorzolamide-timolol (COSOPT) 22.3-6.8 MG/ML ophthalmic solution Place 1 drop into both eyes 2 (two) times daily.     DULoxetine  (CYMBALTA) 30 MG capsule TAKE 1 CAPSULE BY MOUTH EVERYDAY AT BEDTIME 90 capsule 0   Fluticasone-Umeclidin-Vilant (TRELEGY ELLIPTA) 100-62.5-25 MCG/INH AEPB Inhale 1 puff into the lungs daily. 28 each 0   folic acid (FOLVITE) 1 MG tablet Take 1 mg by mouth daily after lunch.  0   furosemide (LASIX) 40 MG tablet Take 1 tablet (40 mg total) by mouth daily. 30 tablet 11   gabapentin (NEURONTIN) 800 MG tablet Take 800 mg by mouth 2 (two) times daily.     latanoprost (XALATAN) 0.005 % ophthalmic solution Place 1 drop into both eyes at bedtime.     metaxalone (SKELAXIN) 400 MG tablet Take 1 tablet (400 mg total) by mouth daily as needed. 30 tablet 2   Multiple Vitamins-Minerals (HAIR/SKIN/NAILS/BIOTIN) TABS Take 1 tablet by mouth daily after lunch.      nicotine (NICOTROL) 10 MG inhaler Inhale 1 Cartridge (1 continuous puffing total) into the lungs as needed for smoking cessation. 168 each 4   omeprazole (PRILOSEC) 20 MG capsule Take by mouth as needed. (Patient not taking: Reported on 08/03/2021)  4   OXYGEN Inhale 4 L into the lungs continuous.     rizatriptan (MAXALT-MLT) 10 MG disintegrating tablet Take 10 mg by mouth as needed for migraine. May repeat in 2 hours if needed     SAXENDA 18 MG/3ML SOPN 0.6 MG SQ ONCE A DAY X1 WEEK, THEN INCREASE DOSE BY 0.6 MG/DAY EVERY WEEK (MAX  1.8 MG/DAY) DAILY     simvastatin (ZOCOR) 10 MG tablet Take 10 mg by mouth at bedtime.     topiramate (TOPAMAX) 25 MG tablet Take 50 mg by mouth as needed.      traZODone (DESYREL) 100 MG tablet Take 100 mg by mouth at bedtime.     TRELEGY ELLIPTA 100-62.5-25 MCG/INH AEPB TAKE 1 PUFF BY MOUTH EVERY DAY 60 each 5   vitamin B-12 (CYANOCOBALAMIN) 1000 MCG tablet Take 1,000 mcg by mouth daily after lunch.     No current facility-administered medications on file prior to visit.    Allergies  Allergen Reactions   Citrus Hives   Lisinopril Cough    Family History  Problem Relation Age of Onset   Diabetes Mother     Hypertension Mother    CAD Mother    Diabetes Father    Lung cancer Father    Diabetes Sister    Hypercalcemia Neg Hx     BP 120/62 (BP Location: Left Arm, Patient Position: Sitting, Cuff Size: Normal)   Pulse 78   Ht 5\' 8"  (1.727 m)   Wt 167 lb (75.8 kg)   LMP 08/19/2012   SpO2 97%  BMI 25.39 kg/m    Review of Systems     Objective:   Physical Exam VITAL SIGNS:  See vs page GENERAL: no distress.  Has 02 on.   Ext: no leg edema.    Ca++=11.2 25-OH Vit-D=48 PTH=66 Transaminases are normal.     Assessment & Plan:  Vit-D def: well-controlled.   Hypercalcemia: uncontrolled.  uncertain etiology and prognosis.  Smoker: in view of this and hypercalcemia, we should check chest CT.    Patient Instructions  A 24HR urine test is requested for you today.  We'll let you know about the results.  Let's check the chest CT.  you will receive a phone call, about a day and time for an appointment. Please continue the same Vitamin-D Please come back for a follow-up appointment in 6 months.

## 2021-08-17 NOTE — Patient Instructions (Signed)
A 24HR urine test is requested for you today.  We'll let you know about the results.  Let's check the chest CT.  you will receive a phone call, about a day and time for an appointment. Please continue the same Vitamin-D Please come back for a follow-up appointment in 6 months.

## 2021-08-18 ENCOUNTER — Encounter: Payer: Self-pay | Admitting: Rheumatology

## 2021-08-18 ENCOUNTER — Ambulatory Visit (INDEPENDENT_AMBULATORY_CARE_PROVIDER_SITE_OTHER): Payer: 59 | Admitting: Rheumatology

## 2021-08-18 VITALS — BP 137/90 | HR 81 | Ht 65.0 in | Wt 170.0 lb

## 2021-08-18 DIAGNOSIS — I5032 Chronic diastolic (congestive) heart failure: Secondary | ICD-10-CM | POA: Diagnosis not present

## 2021-08-18 DIAGNOSIS — M7061 Trochanteric bursitis, right hip: Secondary | ICD-10-CM

## 2021-08-18 DIAGNOSIS — R5383 Other fatigue: Secondary | ICD-10-CM | POA: Diagnosis not present

## 2021-08-18 DIAGNOSIS — I2510 Atherosclerotic heart disease of native coronary artery without angina pectoris: Secondary | ICD-10-CM | POA: Diagnosis not present

## 2021-08-18 DIAGNOSIS — Z87891 Personal history of nicotine dependence: Secondary | ICD-10-CM

## 2021-08-18 DIAGNOSIS — M797 Fibromyalgia: Secondary | ICD-10-CM | POA: Diagnosis not present

## 2021-08-18 DIAGNOSIS — I272 Pulmonary hypertension, unspecified: Secondary | ICD-10-CM | POA: Diagnosis not present

## 2021-08-18 DIAGNOSIS — D472 Monoclonal gammopathy: Secondary | ICD-10-CM

## 2021-08-18 DIAGNOSIS — M545 Low back pain, unspecified: Secondary | ICD-10-CM

## 2021-08-18 DIAGNOSIS — F5101 Primary insomnia: Secondary | ICD-10-CM

## 2021-08-18 DIAGNOSIS — Z8709 Personal history of other diseases of the respiratory system: Secondary | ICD-10-CM | POA: Diagnosis not present

## 2021-08-18 DIAGNOSIS — Z8669 Personal history of other diseases of the nervous system and sense organs: Secondary | ICD-10-CM | POA: Diagnosis not present

## 2021-08-18 DIAGNOSIS — G8929 Other chronic pain: Secondary | ICD-10-CM

## 2021-08-18 DIAGNOSIS — M62838 Other muscle spasm: Secondary | ICD-10-CM | POA: Diagnosis not present

## 2021-08-18 DIAGNOSIS — I10 Essential (primary) hypertension: Secondary | ICD-10-CM | POA: Diagnosis not present

## 2021-08-18 DIAGNOSIS — M7062 Trochanteric bursitis, left hip: Secondary | ICD-10-CM

## 2021-08-18 MED ORDER — TRIAMCINOLONE ACETONIDE 40 MG/ML IJ SUSP
10.0000 mg | INTRAMUSCULAR | Status: AC | PRN
  Administered 2021-08-18: 10 mg via INTRAMUSCULAR

## 2021-08-18 MED ORDER — LIDOCAINE HCL 1 % IJ SOLN
0.5000 mL | INTRAMUSCULAR | Status: AC | PRN
  Administered 2021-08-18: .5 mL

## 2021-08-20 ENCOUNTER — Other Ambulatory Visit: Payer: Self-pay

## 2021-08-20 ENCOUNTER — Other Ambulatory Visit: Payer: 59

## 2021-08-20 ENCOUNTER — Other Ambulatory Visit: Payer: Self-pay | Admitting: Gastroenterology

## 2021-08-20 DIAGNOSIS — Z86010 Personal history of colonic polyps: Secondary | ICD-10-CM | POA: Diagnosis not present

## 2021-08-20 DIAGNOSIS — K625 Hemorrhage of anus and rectum: Secondary | ICD-10-CM | POA: Diagnosis not present

## 2021-08-20 DIAGNOSIS — K219 Gastro-esophageal reflux disease without esophagitis: Secondary | ICD-10-CM | POA: Diagnosis not present

## 2021-08-21 LAB — CALCIUM, URINE, 24 HOUR: Calcium, 24H Urine: 208 mg/24 h

## 2021-08-24 ENCOUNTER — Telehealth: Payer: Self-pay | Admitting: Pulmonary Disease

## 2021-08-24 DIAGNOSIS — J432 Centrilobular emphysema: Secondary | ICD-10-CM

## 2021-08-24 NOTE — Telephone Encounter (Signed)
Called and spoke with pt and she stated that she is needing to get a new nebulizer machine from [facility].  She stated that she will need to have the albuterol sent in as well.  RA please advise. thanks

## 2021-08-25 MED ORDER — ALBUTEROL SULFATE (2.5 MG/3ML) 0.083% IN NEBU: 2.5000 mg | INHALATION_SOLUTION | Freq: Four times a day (QID) | RESPIRATORY_TRACT | 5 refills | Status: DC | PRN

## 2021-08-25 NOTE — Telephone Encounter (Signed)
Called and spoke with pt and she is aware of order that has been sent to [facility] for the nebulizer machine and I have sent the albuterol to the pharmacy.  Nothing further is needed.

## 2021-08-26 ENCOUNTER — Telehealth (HOSPITAL_COMMUNITY): Payer: Self-pay

## 2021-08-26 NOTE — Telephone Encounter (Signed)
Pt insurance is active and benefits verified through [facility]. Co-pay $17.00, DED $0.00/$0.00 met, out of pocket $500.00/$221.00 met, co-insurance 0%. No pre-authorization required. [employer], 08/21/21 @ 945AM, [id number]   2ndary insurance is active and benefits verified through [employer]. Co-pay $0.00, DED $230.00/$230.00 met, out of pocket $0.00/$0.00 met, co-insurance 20%. No pre-authorization required.    Will contact patient to see if she is interested in the Pulmonary Rehab Program.

## 2021-08-26 NOTE — Telephone Encounter (Signed)
Called patient to see if she is interested in the Pulmonary Rehab Program. Patient expressed interest. Explained scheduling process and went over insurance, patient verbalized understanding. Also adv pt where we are with scheduling for PR and that we have a backlog of 1-4 months. 

## 2021-09-02 ENCOUNTER — Ambulatory Visit
Admission: RE | Admit: 2021-09-02 | Discharge: 2021-09-02 | Disposition: A | Discharge status: Home / Self Care | Payer: 59 | Source: Ambulatory Visit | Attending: Endocrinology | Admitting: Endocrinology

## 2021-09-02 ENCOUNTER — Other Ambulatory Visit: Payer: Self-pay

## 2021-09-02 DIAGNOSIS — Z720 Tobacco use: Secondary | ICD-10-CM

## 2021-09-30 DIAGNOSIS — M5416 Radiculopathy, lumbar region: Secondary | ICD-10-CM | POA: Diagnosis not present

## 2021-10-03 DIAGNOSIS — M5416 Radiculopathy, lumbar region: Secondary | ICD-10-CM | POA: Diagnosis not present

## 2021-10-21 DIAGNOSIS — G894 Chronic pain syndrome: Secondary | ICD-10-CM | POA: Diagnosis not present

## 2021-10-21 DIAGNOSIS — M5416 Radiculopathy, lumbar region: Secondary | ICD-10-CM | POA: Diagnosis not present

## 2021-11-03 NOTE — Progress Notes (Signed)
Office Visit Note  Patient: Teresa Roberts             Date of Birth: 09-18-1961           MRN: [id number]             PCP: Jonathon Jordan, MD Referring: Jonathon Jordan, MD Visit Date: 11/17/2021 Occupation: @GUAROCC @  Subjective:  Pain in both shoulders   History of Present Illness: Teresa Roberts is a 60 y.o. female with history of fibromyalgia.  She has persistent trapezius muscle tension and tenderness bilaterally.  She experiences muscle spasms on occasion.  She is requesting trapezius trigger point injections today which have alleviated her symptoms in the past.  She states over the past 2 to 3 weeks she has been experiencing increased pain in both wrist joints.  She states that the pain has been radiating into her hands.  She describes the pain as a throbbing sensation and at times has pins-and-needles.  She denies any joint swelling.  She is been experiencing some cramping in her hands intermittently.  She denies any overuse activities recently.  She has not had any nocturnal pain in her hands recently.  She continues to have generalized myalgias and muscle tenderness due to fibromyalgia.  She has chronic fatigue which has been unchanged.  She remains on oxygen.   Activities of Daily Living:  Patient reports morning stiffness for 2 hours.   Patient Reports nocturnal pain.  Difficulty dressing/grooming: Reports Difficulty climbing stairs: Reports Difficulty getting out of chair: Reports Difficulty using hands for taps, buttons, cutlery, and/or writing: Reports  Review of Systems  Constitutional:  Positive for fatigue.  HENT:  Positive for nose dryness. Negative for mouth sores and mouth dryness.   Eyes:  Negative for pain, itching and dryness.  Respiratory:  Negative for shortness of breath and difficulty breathing.   Cardiovascular:  Negative for chest pain and palpitations.  Gastrointestinal:  Negative for blood in stool, constipation and diarrhea.  Endocrine: Negative for  increased urination.  Genitourinary:  Negative for difficulty urinating.  Musculoskeletal:  Positive for joint pain, joint pain, joint swelling, myalgias, morning stiffness, muscle tenderness and myalgias.  Skin:  Negative for color change, rash and redness.  Allergic/Immunologic: Negative for susceptible to infections.  Neurological:  Positive for numbness, memory loss and weakness. Negative for dizziness and headaches.  Hematological:  Positive for bruising/bleeding tendency.  Psychiatric/Behavioral:  Positive for confusion.    PMFS History:  Patient Active Problem List   Diagnosis Date Noted   Personal history of colonic polyps 01/01/2021   Esophageal reflux 01/01/2021   Hypercalcemia 09/23/2020   Hyperparathyroidism, primary ([place]) 08/25/2020   Iron deficiency anemia due to chronic blood loss 06/27/2019   Coronary artery disease involving native coronary artery of native heart without angina pectoris 09/05/2018   Syncope and collapse 09/05/2018   Pain in joint of left elbow 09/04/2018   Chest pain 05/27/2018   HLD (hyperlipidemia) 05/27/2018   Essential hypertension 05/27/2018   Depression 05/27/2018   OSA (obstructive sleep apnea) 05/03/2018   Pulmonary hypertension ([place]) 05/03/2018   Bimalleolar fracture of left ankle 08/12/2017   Acute renal failure (ARF) ([place]) 08/09/2017   Chronic diastolic (congestive) heart failure ([place]) 08/09/2017   Other fatigue 06/13/2017   Primary insomnia 06/13/2017   History of migraine 06/13/2017   COPD (chronic obstructive pulmonary disease) ([place]) 02/15/2017   Tobacco abuse 02/15/2017   Respiratory failure with hypoxia ([place]) 01/28/2017   Migraine 01/28/2017   Daily headache  Hypoxia    Non-ST elevation myocardial infarction (NSTEMI), type 2 01/26/2017   MGUS (monoclonal gammopathy of unknown significance) 01/11/2012   Fibromyalgia 01/11/2012   Bruises easily 01/11/2012    Past Medical History:  Diagnosis  Date   CAD (coronary artery disease)    a. 01/2017: cath showing 20% mid RCA stenosis and otherwise normal LAD and LCx with a preserved EF of 55 to 60%; mod pulmonary HTN   COPD (chronic obstructive pulmonary disease) (HCC)    O2 dependent   cpap    Depression    PTSD   Diastolic CHF ([place])    Fibromyalgia    Hyperlipidemia    Hypertension    Migraines     Family History  Problem Relation Age of Onset   Diabetes Mother    Hypertension Mother    CAD Mother    Diabetes Father    Lung cancer Father    Diabetes Sister    Hypercalcemia Neg Hx    Past Surgical History:  Procedure Laterality Date   APPENDECTOMY     BIOPSY  01/01/2021   Procedure: BIOPSY;  Surgeon: Wilford Corner, MD;  Location: [facility];  Service: Endoscopy;;   CESAREAN SECTION     COLONOSCOPY WITH PROPOFOL N/A 01/01/2021   Procedure: COLONOSCOPY WITH PROPOFOL;  Surgeon: Wilford Corner, MD;  Location: [facility];  Service: Endoscopy;  Laterality: N/A;   ESOPHAGOGASTRODUODENOSCOPY (EGD) WITH PROPOFOL N/A 01/01/2021   Procedure: ESOPHAGOGASTRODUODENOSCOPY (EGD) WITH PROPOFOL;  Surgeon: Wilford Corner, MD;  Location: [facility];  Service: Endoscopy;  Laterality: N/A;   ORIF ANKLE FRACTURE Left 08/12/2017   Procedure: OPEN REDUCTION INTERNAL FIXATION (ORIF) ANKLE FRACTURE;  Surgeon: Rod Can, MD;  Location: [facility];  Service: Orthopedics;  Laterality: Left;   RIGHT HEART CATH N/A 07/19/2018   Procedure: RIGHT HEART CATH;  Surgeon: Jolaine Artist, MD;  Location: [facility];  Service: Cardiovascular;  Laterality: N/A;   RIGHT/LEFT HEART CATH AND CORONARY ANGIOGRAPHY N/A 01/27/2017   Procedure: Right/Left Heart Cath and Coronary Angiography;  Surgeon: Troy Sine, MD;  Location: [facility];  Service: Cardiovascular;  Laterality: N/A;   Social History   Social History Narrative   Not on file   Immunization History  Administered Date(s) Administered    Influenza Split 08/15/2017, 08/28/2018, 08/24/2019   Influenza,inj,Quad PF,6+ Mos 12/28/2016, 09/28/2017, 09/04/2018, 08/03/2019, 08/29/2020, 08/03/2021   Influenza,inj,quad, With Preservative 08/09/2014   Influenza-Unspecified 08/15/2016   Moderna Sars-Covid-2 Vaccination 02/11/2020, 03/10/2020, 07/11/2020   Pneumococcal Polysaccharide-23 02/04/2014, 08/31/2016   Pneumococcal-Unspecified 02/13/2013   Tdap 04/25/2013, 02/04/2014   Zoster, Live 05/31/2019, 08/03/2019     Objective: Vital Signs: BP 128/86 (BP Location: Left Arm, Patient Position: Sitting, Cuff Size: Normal)    Pulse 85    Ht 5\' 6"  (1.676 m)    Wt 180 lb 9.6 oz (81.9 kg)    LMP 08/19/2012    BMI 29.15 kg/m    Physical Exam Vitals and nursing note reviewed.  Constitutional:      Appearance: She is well-developed.  HENT:     Head: Normocephalic and atraumatic.  Eyes:     Conjunctiva/sclera: Conjunctivae normal.  Pulmonary:     Effort: Pulmonary effort is normal.  Abdominal:     Palpations: Abdomen is soft.  Musculoskeletal:     Cervical back: Normal range of motion.  Skin:    General: Skin is warm and dry.     Capillary Refill: Capillary refill takes less than 2 seconds.  Neurological:  Mental Status: She is alert and oriented to person, place, and time.  Psychiatric:        Behavior: Behavior normal.     Musculoskeletal Exam: Generalized hyperalgesia and positive tender points.  C-spine has painful limited range of motion.  Trapezius muscle tension and tenderness bilaterally.  Shoulder joint abduction to about 60 degrees.  Elbow joints, wrist joints, MCPs, PIPs, DIPs have good range of motion with no synovitis.  Tenderness over both wrist joints noted.  Hip joints difficult to assess in seated position.  Knee joints have good range of motion with no warmth or effusion.  Ankle joints have good range of motion with no tenderness or joint swelling.  CDAI Exam: CDAI Score: -- Patient Global: --; Provider  Global: -- Swollen: --; Tender: -- Joint Exam 11/17/2021   No joint exam has been documented for this visit   There is currently no information documented on the homunculus. Go to the Rheumatology activity and complete the homunculus joint exam.  Investigation: No additional findings.  Imaging: XR Hand 2 View Left  Result Date: 11/17/2021 CMC, PIP and DIP narrowing was noted.  No MCP, intercarpal or radiocarpal joint space narrowing was noted.  No erosive changes were noted. Impression: These findings are consistent with osteoarthritis of the hand.  XR Hand 2 View Right  Result Date: 11/17/2021 CMC, PIP and DIP narrowing was noted.  No MCP, intercarpal or radiocarpal joint space narrowing was noted.  No erosive changes were noted. Impression: These findings are consistent with osteoarthritis of the hand.   Recent Labs: Lab Results  Component Value Date   WBC 5.2 08/27/2019   HGB 13.3 08/27/2019   PLT 164 08/27/2019   NA 142 08/27/2019   K 2.9 (LL) 08/27/2019   CL 100 08/27/2019   CO2 34 (H) 08/27/2019   GLUCOSE 97 08/27/2019   BUN 12 08/27/2019   CREATININE 1.04 (H) 08/27/2019   BILITOT <0.2 (L) 08/27/2019   ALKPHOS 112 08/27/2019   AST 12 (L) 08/27/2019   ALT 13 08/27/2019   PROT 7.5 08/27/2019   ALBUMIN 3.9 08/27/2019   CALCIUM 10.3 09/23/2020   GFRAA >60 08/27/2019    Speciality Comments: No specialty comments available.  Procedures:  Trigger Point Inj  Date/Time: 11/17/2021 2:02 PM Performed by: Ofilia Neas, PA-C Authorized by: Ofilia Neas, PA-C   Consent Given by:  Patient Site marked: the procedure site was marked   Timeout: prior to procedure the correct patient, procedure, and site was verified   Indications:  Pain Total # of Trigger Points:  2 Location: neck   Needle Size:  27 G Approach:  Dorsal Medications #1:  0.5 mL lidocaine 1 %; 10 mg triamcinolone acetonide 40 MG/ML Medications #2:  0.5 mL lidocaine 1 %; 10 mg triamcinolone acetonide 40  MG/ML Patient tolerance:  Patient tolerated the procedure well with no immediate complications Allergies: Citrus and Lisinopril   Assessment / Plan:     Visit Diagnoses: Fibromyalgia: She has generalized hyperalgesia and positive tender points on examination.  She continues to experience intermittent myalgias and muscle tenderness due to underlying fibromyalgia.  She presents today with trapezius muscle tension and tenderness bilaterally.  She has been experiencing muscle spasms intermittently.  She takes Skelaxin 400 mg daily as needed and tizanidine 4 mg at bedtime for muscle spasms.  She requested trigger point injections bilaterally.   She remains on Cymbalta 30 mg daily.  She does not need any refills of these medications at this  time.  Discussed the importance of regular exercise and good sleep hygiene.  She will follow-up in the office in 6 months.  Trapezius muscle spasm -She presents today with trapezius muscle tension and tenderness bilaterally.  She had trigger point injections performed on 08/18/2021 which provided significant relief.  She requested trigger point injections today.  She tolerated procedure well.  Procedure note was completed above.  Aftercare was discussed.  Plan: Trigger Point Inj  Trochanteric bursitis of both hips: She has ongoing discomfort on the lateral aspect of both hips due to trochanter bursitis.  Discussed the importance of performing stretching exercises daily.  Other fatigue: Chronic, stable.    Primary insomnia: She takes trazodone 100 mg at bedtime for insomnia.  Discussed the importance of good sleep hygiene.  Pain in both wrists -She presents today with increased pain in both wrist joints for the past 2-3 months.  She describes the pain as a throbbing sensation.  She has not noticed any joint swelling.  No synovitis was noted on examination.  She has not had any recent injury or overuse activities.  She has been unable to identify a trigger for her symptoms.   She has not been experiencing any nocturnal pain.  She has had intermittent paresthesias in both hands.  X-rays of both hands were updated today which were consistent with osteoarthritis.  She has CMC, PIP, and DIP joint narrowing.  No erosive changes were noted.  The following lab work will be updated today for further evaluation.  Referral to Dr. Ernestina Patches will be placed today for NCV with EMG for further evaluation as well.   If her symptoms persist or worsen if she develops increased joint swelling and ultrasound of both hands will be scheduled to assess for synovitis.  Plan: XR Hand 2 View Right, XR Hand 2 View Left, 14-3-3 eta Protein, Cyclic citrul peptide antibody, IgG, Rheumatoid factor, Sedimentation rate, C-reactive protein  Chronic left-sided low back pain without sciatica: X-rays of the lumbar spine were unremarkable on 07/25/19.  Chronic pain.  Other medical conditions are listed as follows:  History of migraine  Pulmonary hypertension ([place])  History of COPD  Essential hypertension: Blood pressure was 128/86 today in the office.  Coronary artery disease involving native coronary artery of native heart without angina pectoris  Chronic diastolic (congestive) heart failure (HCC)  MGUS (monoclonal gammopathy of unknown significance)  Former smoker    Orders: Orders Placed This Encounter  Procedures   Trigger Point Inj   XR Hand 2 View Right   XR Hand 2 View Left   14-3-3 eta Protein   Cyclic citrul peptide antibody, IgG   Rheumatoid factor   Sedimentation rate   C-reactive protein   No orders of the defined types were placed in this encounter.    Follow-Up Instructions: Return in about 6 months (around 05/17/2022) for Fibromyalgia.   Ofilia Neas, PA-C  Note - This record has been created using Dragon software.  Chart creation errors have been sought, but may not always  have been located. Such creation errors do not reflect on  the standard of medical  care.

## 2021-11-10 ENCOUNTER — Telehealth (HOSPITAL_COMMUNITY): Payer: Self-pay | Admitting: *Deleted

## 2021-11-10 NOTE — Telephone Encounter (Signed)
Called pt to confirm [facility] orientation appointment. I was unable to ready pt but I was able to leave a message. I left her instructions about Covid precautions, directions to the department, proper shoes and our contact number.I ask her to call if she is unable to attend.

## 2021-11-11 ENCOUNTER — Encounter (HOSPITAL_COMMUNITY): Payer: Self-pay | Admitting: *Deleted

## 2021-11-11 ENCOUNTER — Other Ambulatory Visit: Payer: Self-pay | Admitting: Physician Assistant

## 2021-11-11 ENCOUNTER — Ambulatory Visit (HOSPITAL_COMMUNITY): Payer: 59

## 2021-11-11 NOTE — Progress Notes (Signed)
Teresa Roberts was a no show, no call for her appt in pulmonary rehab for orientation and walk test.

## 2021-11-11 NOTE — Telephone Encounter (Signed)
Next Visit: 02/18/2022  Last Visit: 08/18/2021  Last Fill: 08/14/2021  Dx:  Fibromyalgia  Current Dose per office note on 08/18/2021: not discussed  Okay to refill Cymbalta?

## 2021-11-17 ENCOUNTER — Ambulatory Visit: Payer: Self-pay

## 2021-11-17 ENCOUNTER — Ambulatory Visit (HOSPITAL_COMMUNITY): Payer: 59

## 2021-11-17 ENCOUNTER — Encounter: Payer: Self-pay | Admitting: Physician Assistant

## 2021-11-17 ENCOUNTER — Other Ambulatory Visit: Payer: Self-pay

## 2021-11-17 ENCOUNTER — Ambulatory Visit (INDEPENDENT_AMBULATORY_CARE_PROVIDER_SITE_OTHER): Payer: 59 | Admitting: Physician Assistant

## 2021-11-17 VITALS — BP 128/86 | HR 85 | Ht 66.0 in | Wt 180.6 lb

## 2021-11-17 DIAGNOSIS — M25532 Pain in left wrist: Secondary | ICD-10-CM | POA: Diagnosis not present

## 2021-11-17 DIAGNOSIS — I5032 Chronic diastolic (congestive) heart failure: Secondary | ICD-10-CM | POA: Diagnosis not present

## 2021-11-17 DIAGNOSIS — M62838 Other muscle spasm: Secondary | ICD-10-CM

## 2021-11-17 DIAGNOSIS — M7062 Trochanteric bursitis, left hip: Secondary | ICD-10-CM

## 2021-11-17 DIAGNOSIS — M25531 Pain in right wrist: Secondary | ICD-10-CM | POA: Diagnosis not present

## 2021-11-17 DIAGNOSIS — Z8709 Personal history of other diseases of the respiratory system: Secondary | ICD-10-CM | POA: Diagnosis not present

## 2021-11-17 DIAGNOSIS — I272 Pulmonary hypertension, unspecified: Secondary | ICD-10-CM | POA: Diagnosis not present

## 2021-11-17 DIAGNOSIS — M797 Fibromyalgia: Secondary | ICD-10-CM

## 2021-11-17 DIAGNOSIS — I10 Essential (primary) hypertension: Secondary | ICD-10-CM | POA: Diagnosis not present

## 2021-11-17 DIAGNOSIS — M7061 Trochanteric bursitis, right hip: Secondary | ICD-10-CM

## 2021-11-17 DIAGNOSIS — R5383 Other fatigue: Secondary | ICD-10-CM

## 2021-11-17 DIAGNOSIS — D472 Monoclonal gammopathy: Secondary | ICD-10-CM

## 2021-11-17 DIAGNOSIS — M545 Low back pain, unspecified: Secondary | ICD-10-CM

## 2021-11-17 DIAGNOSIS — Z87891 Personal history of nicotine dependence: Secondary | ICD-10-CM

## 2021-11-17 DIAGNOSIS — Z8669 Personal history of other diseases of the nervous system and sense organs: Secondary | ICD-10-CM

## 2021-11-17 DIAGNOSIS — F5101 Primary insomnia: Secondary | ICD-10-CM | POA: Diagnosis not present

## 2021-11-17 DIAGNOSIS — G8929 Other chronic pain: Secondary | ICD-10-CM

## 2021-11-17 DIAGNOSIS — I2510 Atherosclerotic heart disease of native coronary artery without angina pectoris: Secondary | ICD-10-CM | POA: Diagnosis not present

## 2021-11-17 MED ORDER — TRIAMCINOLONE ACETONIDE 40 MG/ML IJ SUSP
10.0000 mg | INTRAMUSCULAR | Status: AC | PRN
  Administered 2021-11-17: 10 mg via INTRAMUSCULAR

## 2021-11-17 MED ORDER — LIDOCAINE HCL 1 % IJ SOLN
0.5000 mL | INTRAMUSCULAR | Status: AC | PRN
  Administered 2021-11-17: .5 mL

## 2021-11-17 NOTE — Addendum Note (Signed)
Addended by: Earnestine Mealing on: 11/17/2021 04:10 PM   Modules accepted: Orders

## 2021-11-18 ENCOUNTER — Telehealth: Payer: Self-pay

## 2021-11-18 NOTE — Telephone Encounter (Signed)
We have provided prescriptions for skelaxin, zanaflex, and cymbalta.    She has a history of elevated LFTs.  I do not have recent lab results to review.    She can try natural antiinflammatories including tart cherry, ginger, omega 3, and tumeric. She should avoid tumeric if she is taking a blood thinner.

## 2021-11-18 NOTE — Telephone Encounter (Signed)
I called patient to advise her of x-ray results from 11/17/2021. Patient verbalized understanding and then asked what she can take for the pain/discomfort.   Can patient take OA Supplements? What do you recommend?   Thanks!

## 2021-11-18 NOTE — Telephone Encounter (Signed)
Advised patient that per Teresa Sams, PA-C, We have provided prescriptions for skelaxin, zanaflex, and cymbalta.     She has a history of elevated LFTs.  I do not have recent lab results to review.     She can try natural antiinflammatories including tart cherry, ginger, omega 3, and tumeric. She should avoid tumeric if she is taking a blood thinner.   Patient verbalized understanding.

## 2021-11-19 ENCOUNTER — Ambulatory Visit (HOSPITAL_COMMUNITY): Payer: 59

## 2021-11-23 LAB — RHEUMATOID FACTOR: Rheumatoid fact SerPl-aCnc: <14 IU/mL (ref <14)

## 2021-11-23 LAB — 14-3-3 ETA PROTEIN: 14-3-3 eta Protein: <0.2 ng/mL (ref <0.2)

## 2021-11-23 LAB — CYCLIC CITRUL PEPTIDE ANTIBODY, IGG: Cyclic Citrullin Peptide Ab: <16 UNITS

## 2021-11-23 LAB — C-REACTIVE PROTEIN: CRP: 1.2 mg/L (ref <8.0)

## 2021-11-23 LAB — SEDIMENTATION RATE: Sed Rate: 33 mm/h — ABNORMAL HIGH (ref 0–30)

## 2021-11-24 ENCOUNTER — Ambulatory Visit (HOSPITAL_COMMUNITY): Payer: 59

## 2021-11-24 NOTE — Progress Notes (Signed)
RF, anti-CCP, and 14-3-3 eta negative.  CRP WNL.  ESR is borderline elevated.   Labs are not consistent with rheumatoid arthritis.

## 2021-11-26 ENCOUNTER — Ambulatory Visit (HOSPITAL_COMMUNITY): Payer: 59

## 2021-12-01 ENCOUNTER — Ambulatory Visit (HOSPITAL_COMMUNITY): Payer: 59

## 2021-12-03 ENCOUNTER — Ambulatory Visit (HOSPITAL_COMMUNITY): Payer: 59

## 2021-12-04 ENCOUNTER — Other Ambulatory Visit: Payer: Self-pay

## 2021-12-04 ENCOUNTER — Ambulatory Visit (INDEPENDENT_AMBULATORY_CARE_PROVIDER_SITE_OTHER): Payer: 59 | Admitting: Physical Medicine and Rehabilitation

## 2021-12-04 ENCOUNTER — Encounter: Payer: Self-pay | Admitting: Physical Medicine and Rehabilitation

## 2021-12-04 DIAGNOSIS — R202 Paresthesia of skin: Secondary | ICD-10-CM

## 2021-12-04 NOTE — Progress Notes (Signed)
Pt state that her hands gets suck or has a twitch all the time. Pt state its an involuntary movements of her hands. Pt state she feels pain between her thumbs and index finger. Pt state she try heat and pain cream to help ease the pain. Pt state she right handed.  Numeric Pain Rating Scale and Functional Assessment Average Pain 8   In the last MONTH (on 0-10 scale) has pain interfered with the following?  1. General activity like being  able to carry out your everyday physical activities such as walking, climbing stairs, carrying groceries, or moving a chair?  Rating(10)

## 2021-12-07 NOTE — Procedures (Signed)
EMG & NCV Findings: Evaluation of the left median (across palm) sensory and the right median (across palm) sensory nerves showed prolonged distal peak latency (Wrist, L4.2, R4.0 ms) and prolonged distal peak latency (Palm, L2.3, R2.1 ms).  All remaining nerves (as indicated in the following tables) were within normal limits.  All left vs. right side differences were within normal limits.    All examined muscles (as indicated in the following table) showed no evidence of electrical instability.    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   There is no significant electrodiagnostic evidence of any other focal nerve entrapment, brachial plexopathy or cervical radiculopathy. **This electrodiagnostic study cannot rule out small fiber polyneuropathy and dysesthesias from central pain syndromes such as stroke or central pain sensitization syndromes such as fibromyalgia.  Myotomal referral pain from trigger points is also not excluded.  Recommendations: 1.  Follow-up with referring physician. 2.  Continue current management of symptoms. 3.  Continue use of resting splint at night-time and as needed during the day.  ___________________________ Wonda Olds Board Certified, American Board of Physical Medicine and Rehabilitation    Nerve Conduction Studies Anti Sensory Summary Table   Stim Site NR Peak (ms) Norm Peak (ms) P-T Amp (V) Norm P-T Amp Site1 Site2 Delta-P (ms) Dist (cm) Vel (m/s) Norm Vel (m/s)  Left Median Acr Palm Anti Sensory (2nd Digit)  29.5C  Wrist    *4.2 <3.6 36.9 >10 Wrist Palm 1.9 0.0    Palm    *2.3 <2.0 2.4         Right Median Acr Palm Anti Sensory (2nd Digit)  29.5C  Wrist    *4.0 <3.6 30.7 >10 Wrist Palm 1.9 0.0    Palm    *2.1 <2.0 24.8         Left Radial Anti Sensory (Base 1st Digit)  29.7C  Wrist    2.3 <3.1  34.5  Wrist Base 1st Digit 2.3 0.0    Right Radial Anti Sensory (Base 1st Digit)  29.9C  Wrist    2.3 <3.1 21.4  Wrist Base 1st Digit 2.3 0.0    Left Ulnar Anti Sensory (5th Digit)  29.8C  Wrist    3.7 <3.7 24.8 >15.0 Wrist 5th Digit 3.7 14.0 38 >38  Right Ulnar Anti Sensory (5th Digit)  30C  Wrist    3.5 <3.7 17.2 >15.0 Wrist 5th Digit 3.5 14.0 40 >38   Motor Summary Table   Stim Site NR Onset (ms) Norm Onset (ms) O-P Amp (mV) Norm O-P Amp Site1 Site2 Delta-0 (ms) Dist (cm) Vel (m/s) Norm Vel (m/s)  Left Median Motor (Abd Poll Brev)  29.7C  Wrist    3.4 <4.2 12.6 >5 Elbow Wrist 4.2 23.0 55 >50  Elbow    7.6  12.5         Right Median Motor (Abd Poll Brev)  30C  Wrist    3.4 <4.2 11.2 >5 Elbow Wrist 4.4 23.0 52 >50  Elbow    7.8  10.7         Left Ulnar Motor (Abd Dig Min)  29.8C  Wrist    2.8 <4.2 8.6 >3 B Elbow Wrist 3.7 22.0 59 >53  B Elbow    6.5  8.7  A Elbow B Elbow 1.4 10.0 71 >53  A Elbow    7.9  6.9  Right Ulnar Motor (Abd Dig Min)  30.1C  Wrist    2.7 <4.2 9.3 >3 B Elbow Wrist 3.9 22.5 58 >53  B Elbow    6.6  8.9  A Elbow B Elbow 1.3 10.0 77 >53  A Elbow    7.9  8.3          EMG   Side Muscle Nerve Root Ins Act Fibs Psw Amp Dur Poly Recrt Int Fraser Din Comment  Right Abd Poll Brev Median C8-T1 Nml Nml Nml Nml Nml 0 Nml Nml   Right 1stDorInt Ulnar C8-T1 Nml Nml Nml Nml Nml 0 Nml Nml   Right PronatorTeres Median C6-7 Nml Nml Nml Nml Nml 0 Nml Nml   Right Biceps Musculocut C5-6 Nml Nml Nml Nml Nml 0 Nml Nml   Right Deltoid Axillary C5-6 Nml Nml Nml Nml Nml 0 Nml Nml     Nerve Conduction Studies Anti Sensory Left/Right Comparison   Stim Site L Lat (ms) R Lat (ms) L-R Lat (ms) L Amp (V) R Amp (V) L-R Amp (%) Site1 Site2 L Vel (m/s) R Vel (m/s) L-R Vel (m/s)  Median Acr Palm Anti Sensory (2nd Digit)  29.5C  Wrist *4.2 *4.0 0.2 36.9 30.7 16.8 Wrist Palm     Palm *2.3 *2.1 0.2 2.4 24.8 90.3       Radial Anti Sensory (Base 1st Digit)  29.7C  Wrist 2.3 2.3 0.0  34.5 21.4 38.0 Wrist Base 1st Digit     Ulnar Anti Sensory (5th Digit)  29.8C  Wrist 3.7 3.5 0.2 24.8 17.2 30.6 Wrist 5th Digit 38 40 2   Motor Left/Right Comparison   Stim Site L Lat (ms) R Lat (ms) L-R Lat (ms) L Amp (mV) R Amp (mV) L-R Amp (%) Site1 Site2 L Vel (m/s) R Vel (m/s) L-R Vel (m/s)  Median Motor (Abd Poll Brev)  29.7C  Wrist 3.4 3.4 0.0 12.6 11.2 11.1 Elbow Wrist 55 52 3  Elbow 7.6 7.8 0.2 12.5 10.7 14.4       Ulnar Motor (Abd Dig Min)  29.8C  Wrist 2.8 2.7 0.1 8.6 9.3 7.5 B Elbow Wrist 59 58 1  B Elbow 6.5 6.6 0.1 8.7 8.9 2.2 A Elbow B Elbow 71 77 6  A Elbow 7.9 7.9 0.0 6.9 8.3 16.9          Waveforms:

## 2021-12-08 ENCOUNTER — Ambulatory Visit (HOSPITAL_COMMUNITY): Payer: 59

## 2021-12-08 NOTE — Progress Notes (Signed)
Teresa Roberts - 61 y.o. female MRN [id number]  Date of birth: 11/14/1961  Office Visit Note: Visit Date: 12/04/2021 PCP: Jonathon Jordan, MD Referred by: Ofilia Neas, PA-C  Subjective: Chief Complaint  Patient presents with   Right Hand - Numbness, Pain   Left Hand - Numbness, Pain   HPI:  Teresa Roberts is a 61 y.o. female who comes in today at the request of Hazel Sams, PA-C for electrodiagnostic study of the Bilateral upper extremities.  Patient is Right hand dominant.  She reports chronic worsening severe bilateral hand pain with twitching and she feels like her hands get "stuck ".  She reports involuntary movements and tremoring of the hands.  She does get some symptoms she feels between the thumb and index finger with some paresthesia or dysesthesia worse at night.  She is try different pain creams as well as heat and ice and medication without relief.  She denies any frank radicular symptoms.  She has no prior electrodiagnostic studies.  Her case is complicated by COPD and pulmonary hypertension as well as fibromyalgia and migraine headache.  ROS Otherwise per HPI.  Assessment & Plan: Visit Diagnoses:    ICD-10-CM   1. 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    There is no significant electrodiagnostic evidence of any other focal nerve entrapment, brachial plexopathy or cervical radiculopathy. **This electrodiagnostic study cannot rule out small fiber polyneuropathy and dysesthesias from central pain syndromes such as stroke or central pain sensitization syndromes such as fibromyalgia.  Myotomal referral pain from trigger points is also not excluded.   Recommendations: 1.  Follow-up with referring physician. 2.  Continue current management of symptoms. 3.   Continue use of resting splint at night-time and as needed during the day.  Meds & Orders: No orders of the defined types were placed in this encounter.   Orders Placed This Encounter  Procedures   NCV with EMG (electromyography)    Follow-up: Return in about 2 weeks (around 12/18/2021) for Hazel Sams, PA-C.   Procedures: No procedures performed  EMG & NCV Findings: Evaluation of the left median (across palm) sensory and the right median (across palm) sensory nerves showed prolonged distal peak latency (Wrist, L4.2, R4.0 ms) and prolonged distal peak latency (Palm, L2.3, R2.1 ms).  All remaining nerves (as indicated in the following tables) were within normal limits.  All left vs. right side differences were within normal limits.    All examined muscles (as indicated in the following table) showed no evidence of electrical instability.    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   There is no significant electrodiagnostic evidence of any other focal nerve entrapment, brachial plexopathy or cervical radiculopathy. **This electrodiagnostic study cannot rule out small fiber polyneuropathy and dysesthesias from central pain syndromes such as stroke or central pain sensitization syndromes such as fibromyalgia.  Myotomal referral pain from trigger points is also not excluded.  Recommendations: 1.  Follow-up with referring physician. 2.  Continue current management of symptoms. 3.  Continue use of resting splint at night-time and as needed during the day.  ___________________________ [place] Certified, American Board of Physical Medicine and Rehabilitation  Nerve Conduction Studies Anti Sensory Summary Table   Stim Site NR Peak (ms) Norm Peak (ms) P-T Amp (V) Norm P-T Amp Site1 Site2 Delta-P (ms) Dist (cm) Vel (m/s) Norm  Vel (m/s)  Left Median Acr Palm Anti Sensory (2nd Digit)  29.5C  Wrist    *4.2 <3.6 36.9 >10 Wrist Palm 1.9 0.0    Palm    *2.3 <2.0 2.4         Right Median Acr Palm Anti Sensory (2nd Digit)  29.5C  Wrist    *4.0 <3.6 30.7 >10 Wrist Palm 1.9 0.0    Palm    *2.1 <2.0 24.8         Left Radial Anti Sensory (Base 1st Digit)  29.7C  Wrist    2.3 <3.1 34.5  Wrist Base 1st Digit 2.3 0.0    Right Radial Anti Sensory (Base 1st Digit)  29.9C  Wrist    2.3 <3.1 21.4  Wrist Base 1st Digit 2.3 0.0    Left Ulnar Anti Sensory (5th Digit)  29.8C  Wrist    3.7 <3.7 24.8 >15.0 Wrist 5th Digit 3.7 14.0 38 >38  Right Ulnar Anti Sensory (5th Digit)  30C  Wrist    3.5 <3.7 17.2 >15.0 Wrist 5th Digit 3.5 14.0 40 >38   Motor Summary Table   Stim Site NR Onset (ms) Norm Onset (ms) O-P Amp (mV) Norm O-P Amp Site1 Site2 Delta-0 (ms) Dist (cm) Vel (m/s) Norm Vel (m/s)  Left Median Motor (Abd Poll Brev)  29.7C  Wrist    3.4 <4.2 12.6 >5 Elbow Wrist 4.2 23.0 55 >50  Elbow    7.6  12.5         Right Median Motor (Abd Poll Brev)  30C  Wrist    3.4 <4.2 11.2 >5 Elbow Wrist 4.4 23.0 52 >50  Elbow    7.8  10.7         Left Ulnar Motor (Abd Dig Min)  29.8C  Wrist    2.8 <4.2 8.6 >3 B Elbow Wrist 3.7 22.0 59 >53  B Elbow    6.5  8.7  A Elbow B Elbow 1.4 10.0 71 >53  A Elbow    7.9  6.9         Right Ulnar Motor (Abd Dig Min)  30.1C  Wrist    2.7 <4.2 9.3 >3 B Elbow Wrist 3.9 22.5 58 >53  B Elbow    6.6  8.9  A Elbow B Elbow 1.3 10.0 77 >53  A Elbow    7.9  8.3          EMG   Side Muscle Nerve Root Ins Act Fibs Psw Amp Dur Poly Recrt Int Fraser Din Comment  Right Abd Poll Brev Median C8-T1 Nml Nml Nml Nml Nml 0 Nml Nml   Right 1stDorInt Ulnar C8-T1 Nml Nml Nml Nml Nml 0 Nml Nml   Right PronatorTeres Median C6-7 Nml Nml Nml Nml Nml 0 Nml Nml   Right Biceps Musculocut C5-6 Nml Nml Nml Nml Nml 0 Nml Nml   Right Deltoid Axillary C5-6 Nml Nml Nml Nml Nml 0 Nml Nml     Nerve Conduction Studies Anti Sensory  Left/Right Comparison   Stim Site L Lat (ms) R Lat (ms) L-R Lat (ms) L Amp (V) R Amp (V) L-R Amp (%) Site1 Site2 L Vel (m/s) R Vel (m/s) L-R Vel (m/s)  Median Acr Palm Anti Sensory (2nd Digit)  29.5C  Wrist *4.2 *4.0 0.2  36.9 30.7 16.8 Wrist Palm     Palm *2.3 *2.1 0.2 2.4 24.8 90.3       Radial Anti Sensory (Base 1st Digit)  29.7C  Wrist 2.3 2.3 0.0 34.5 21.4 38.0 Wrist Base 1st Digit     Ulnar Anti Sensory (5th Digit)  29.8C  Wrist 3.7 3.5 0.2 24.8 17.2 30.6 Wrist 5th Digit 38 40 2   Motor Left/Right Comparison   Stim Site L Lat (ms) R Lat (ms) L-R Lat (ms) L Amp (mV) R Amp (mV) L-R Amp (%) Site1 Site2 L Vel (m/s) R Vel (m/s) L-R Vel (m/s)  Median Motor (Abd Poll Brev)  29.7C  Wrist 3.4 3.4 0.0 12.6 11.2 11.1 Elbow Wrist 55 52 3  Elbow 7.6 7.8 0.2 12.5 10.7 14.4       Ulnar Motor (Abd Dig Min)  29.8C  Wrist 2.8 2.7 0.1 8.6 9.3 7.5 B Elbow Wrist 59 58 1  B Elbow 6.5 6.6 0.1 8.7 8.9 2.2 A Elbow B Elbow 71 77 6  A Elbow 7.9 7.9 0.0 6.9 8.3 16.9          Waveforms:                      Clinical History: No specialty comments available.     Objective:  VS:  HT:     WT:    BMI:      BP:    HR: bpm   TEMP: ( )   RESP:  Physical Exam Musculoskeletal:        General: No swelling, tenderness or deformity.     Comments: Inspection reveals no atrophy of the bilateral APB or FDI or hand intrinsics. There is no swelling, color changes, allodynia or dystrophic changes. There is 5 out of 5 strength in the bilateral wrist extension, finger abduction and long finger flexion. There is intact sensation to light touch in all dermatomal and peripheral nerve distributions.  There is a equivocally positive Phalen's test bilaterally. There is a negative Hoffmann's test bilaterally.  Skin:    General: Skin is warm and dry.     Findings: No erythema or rash.  Neurological:     General: No focal deficit present.     Mental Status: She is alert and oriented to person, place, and time.      Motor: No weakness or abnormal muscle tone.     Coordination: Coordination normal.  Psychiatric:        Mood and Affect: Mood normal.        Behavior: Behavior normal.     Imaging: No results found.

## 2021-12-10 ENCOUNTER — Ambulatory Visit (HOSPITAL_COMMUNITY): Payer: 59

## 2021-12-15 ENCOUNTER — Ambulatory Visit (HOSPITAL_COMMUNITY): Payer: 59

## 2021-12-17 ENCOUNTER — Ambulatory Visit (HOSPITAL_COMMUNITY): Payer: 59

## 2021-12-22 ENCOUNTER — Ambulatory Visit (HOSPITAL_COMMUNITY): Payer: 59

## 2021-12-24 ENCOUNTER — Ambulatory Visit (HOSPITAL_COMMUNITY): Payer: 59

## 2021-12-28 NOTE — Progress Notes (Signed)
Spoke to patient regarding pending colonoscopy, patient was unaware that she was scheduled for 01-05-22.  She will call physician's office to possibly reschedule.

## 2021-12-29 ENCOUNTER — Telehealth (HOSPITAL_COMMUNITY): Payer: Self-pay

## 2021-12-29 ENCOUNTER — Ambulatory Visit (HOSPITAL_COMMUNITY): Payer: 59

## 2021-12-29 NOTE — Telephone Encounter (Signed)
Pt insurance is active and benefits verified through [employer] Co-pay $17, DED 0/0 met, out of pocket $500/$17 met, co-insurance 0%. no pre-authorization required. Passport, [employer] 12/29/2021@10:51am, REF# [id number] °

## 2021-12-29 NOTE — Telephone Encounter (Signed)
Called patient to see if she was interested in participating in the Pulmonary Rehab Program. Patient stated yes. Patient will come in for orientation on 01/22/2022@8 :15am and will attend the 1:15pm exercise class.   Mailed package

## 2021-12-31 ENCOUNTER — Ambulatory Visit (HOSPITAL_COMMUNITY): Payer: 59

## 2021-12-31 ENCOUNTER — Other Ambulatory Visit: Payer: Self-pay | Admitting: Gastroenterology

## 2022-01-04 ENCOUNTER — Encounter (HOSPITAL_COMMUNITY): Payer: Self-pay | Admitting: Anesthesiology

## 2022-01-04 NOTE — Anesthesia Preprocedure Evaluation (Deleted)
Anesthesia Evaluation    Reviewed: Allergy & Precautions, H&P , Patient's Chart, lab work & pertinent test results  History of Anesthesia Complications Negative for: history of anesthetic complications  Airway Mallampati: III  TM Distance: >3 FB Neck ROM: Full    Dental no notable dental hx. (+) Teeth Intact, Dental Advisory Given   Pulmonary sleep apnea , COPD,  oxygen dependent, Current Smoker, former smoker,    Pulmonary exam normal        Cardiovascular hypertension, Pt. on medications + Past MI   Rhythm:Regular Rate:Normal  IMPRESSIONS    1. Left ventricular ejection fraction, by estimation, is 55 to 60%. The  left ventricle has normal function. The left ventricle has no regional  wall motion abnormalities. Left ventricular diastolic parameters are  consistent with Grade II diastolic  dysfunction (pseudonormalization).  2. Right ventricular systolic function is normal. The right ventricular  size is normal.  3. The mitral valve is grossly normal. Trivial mitral valve  regurgitation.  4. The aortic valve is tricuspid. Aortic valve regurgitation is not  visualized.  5. The inferior vena cava is normal in size with <50% respiratory  variability, suggesting right atrial pressure of 8 mmHg.   Comparison(s): Changes from prior study are noted. 05/28/2018: LVEF 50-55%.    Neuro/Psych  Headaches, PSYCHIATRIC DISORDERS Depression    GI/Hepatic Neg liver ROS, GERD  ,  Endo/Other  negative endocrine ROS  Renal/GU negative Renal ROS  negative genitourinary   Musculoskeletal  (+) Fibromyalgia -  Abdominal   Peds  Hematology  (+) Blood dyscrasia, anemia ,   Anesthesia Other Findings   Reproductive/Obstetrics negative OB ROS                             Anesthesia Physical  Anesthesia Plan  ASA: III  Anesthesia Plan: MAC   Post-op Pain Management:    Induction:  Intravenous  PONV Risk Score and Plan: 1 and Propofol infusion  Airway Management Planned: Nasal Cannula  Additional Equipment:   Intra-op Plan:   Post-operative Plan:   Informed Consent: I have reviewed the patients History and Physical, chart, labs and discussed the procedure including the risks, benefits and alternatives for the proposed anesthesia with the patient or authorized representative who has indicated his/her understanding and acceptance.     Dental advisory given  Plan Discussed with: CRNA  Anesthesia Plan Comments: (Did not show: cancelled)       Anesthesia Quick Evaluation

## 2022-01-05 ENCOUNTER — Ambulatory Visit (HOSPITAL_COMMUNITY): Admission: RE | Admit: 2022-01-05 | Payer: Medicare Other | Source: Home / Self Care | Admitting: Gastroenterology

## 2022-01-05 ENCOUNTER — Encounter (HOSPITAL_COMMUNITY): Admission: RE | Payer: Self-pay | Source: Home / Self Care

## 2022-01-05 ENCOUNTER — Ambulatory Visit (HOSPITAL_COMMUNITY): Payer: 59

## 2022-01-05 SURGERY — COLONOSCOPY WITH PROPOFOL
Anesthesia: Monitor Anesthesia Care

## 2022-01-05 NOTE — Progress Notes (Signed)
Patient no show today for procedure. Called patient at 0900 and left message there was no answer for patient to call us back.

## 2022-01-07 ENCOUNTER — Ambulatory Visit (HOSPITAL_COMMUNITY): Payer: 59

## 2022-01-12 ENCOUNTER — Ambulatory Visit (HOSPITAL_COMMUNITY): Payer: 59

## 2022-01-13 ENCOUNTER — Other Ambulatory Visit: Payer: Self-pay | Admitting: Physician Assistant

## 2022-01-13 NOTE — Telephone Encounter (Signed)
Next Visit: 02/18/2022 ? ?Last Visit: 11/17/2021 ? ?Last Fill: 08/14/2022 ? ?Dx: Fibromyalgia ? ?Current Dose per office note on 11/17/2021: tizanidine 4 mg at bedtime for muscle spasms. ? ?Okay to refill Tizanidine?   ?

## 2022-01-14 ENCOUNTER — Ambulatory Visit (HOSPITAL_COMMUNITY): Payer: 59

## 2022-01-20 ENCOUNTER — Telehealth (HOSPITAL_COMMUNITY): Payer: Self-pay | Admitting: *Deleted

## 2022-01-22 ENCOUNTER — Inpatient Hospital Stay (HOSPITAL_COMMUNITY)
Admission: RE | Admit: 2022-01-22 | Discharge: 2022-01-22 | Disposition: A | Discharge status: Home / Self Care | Payer: 59 | Source: Ambulatory Visit

## 2022-01-22 NOTE — Progress Notes (Signed)
Pt was a no call, no show for her Pulmonary Rehab orientation appointment.  ?

## 2022-01-28 ENCOUNTER — Ambulatory Visit (HOSPITAL_COMMUNITY): Payer: 59

## 2022-02-02 ENCOUNTER — Ambulatory Visit (HOSPITAL_COMMUNITY): Payer: 59

## 2022-02-04 ENCOUNTER — Ambulatory Visit (HOSPITAL_COMMUNITY): Payer: 59

## 2022-02-04 NOTE — Progress Notes (Deleted)
? ?Office Visit Note ? ?Patient: Teresa Roberts             ?Date of Birth: June 08, 1961           ?MRN: [id number]             ?PCP: Jonathon Jordan, MD ?Referring: Jonathon Jordan, MD ?Visit Date: 02/18/2022 ?Occupation: '@GUAROCC'$ @ ? ?Subjective:  ? ? ?History of Present Illness: Carrine Kroboth is a 61 y.o. female with history of fibromyalgia.  ? ?Activities of Daily Living:  ?Patient reports morning stiffness for *** {minute/hour:19697}.   ?Patient {ACTIONS;DENIES/REPORTS:21021675::"Denies"} nocturnal pain.  ?Difficulty dressing/grooming: {ACTIONS;DENIES/REPORTS:21021675::"Denies"} ?Difficulty climbing stairs: {ACTIONS;DENIES/REPORTS:21021675::"Denies"} ?Difficulty getting out of chair: {ACTIONS;DENIES/REPORTS:21021675::"Denies"} ?Difficulty using hands for taps, buttons, cutlery, and/or writing: {ACTIONS;DENIES/REPORTS:21021675::"Denies"} ? ?No Rheumatology ROS completed.  ? ?PMFS History:  ?Patient Active Problem List  ? Diagnosis Date Noted  ?? Personal history of colonic polyps 01/01/2021  ?? Esophageal reflux 01/01/2021  ?? Hypercalcemia 09/23/2020  ?? Hyperparathyroidism, primary ([place]) 08/25/2020  ?? Iron deficiency anemia due to chronic blood loss 06/27/2019  ?? Coronary artery disease involving native coronary artery of native heart without angina pectoris 09/05/2018  ?? Syncope and collapse 09/05/2018  ?? Pain in joint of left elbow 09/04/2018  ?? Chest pain 05/27/2018  ?? HLD (hyperlipidemia) 05/27/2018  ?? Essential hypertension 05/27/2018  ?? Depression 05/27/2018  ?? OSA (obstructive sleep apnea) 05/03/2018  ?? Pulmonary hypertension ([place]) 05/03/2018  ?? Bimalleolar fracture of left ankle 08/12/2017  ?? Acute renal failure (ARF) ([place]) 08/09/2017  ?? Chronic diastolic (congestive) heart failure ([place]) 08/09/2017  ?? Other fatigue 06/13/2017  ?? Primary insomnia 06/13/2017  ?? History of migraine 06/13/2017  ?? COPD (chronic obstructive pulmonary disease) ([place]) 02/15/2017  ?? Tobacco abuse 02/15/2017  ??  Respiratory failure with hypoxia ([place]) 01/28/2017  ?? Migraine 01/28/2017  ?? Daily headache   ?? Hypoxia   ?? Non-ST elevation myocardial infarction (NSTEMI), type 2 01/26/2017  ?? 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 Fibromyalgia 01/11/2012  ?? Bruises easily 01/11/2012  ?  ?Past Medical History:  ?Diagnosis Date  ?? CAD (coronary artery disease)   ? a. 01/2017: cath showing 20% mid RCA stenosis and otherwise normal LAD and LCx with a preserved EF of 55 to 60%; mod pulmonary HTN  ?? COPD (chronic obstructive pulmonary disease) ([place])   ? O2 dependent  ?? cpap   ?? Depression   ? PTSD  ?? Diastolic CHF ([place])   ?? Fibromyalgia   ?? Hyperlipidemia   ?? Hypertension   ?? Migraines   ?  ?Family History  ?Problem Relation Age of Onset  ?? Diabetes Mother   ?? Hypertension Mother   ?? CAD Mother   ?? Diabetes Father   ?? Lung cancer Father   ?? Diabetes Sister   ?? Hypercalcemia Neg Hx   ? ?Past Surgical History:  ?Procedure Laterality Date  ?? APPENDECTOMY    ?? BIOPSY  01/01/2021  ? Procedure: BIOPSY;  Surgeon: Wilford Corner, MD;  Location: [facility];  Service: Endoscopy;;  ?? CESAREAN SECTION    ?? COLONOSCOPY WITH PROPOFOL N/A 01/01/2021  ? Procedure: COLONOSCOPY WITH PROPOFOL;  Surgeon: Wilford Corner, MD;  Location: [facility];  Service: Endoscopy;  Laterality: N/A;  ?? ESOPHAGOGASTRODUODENOSCOPY (EGD) WITH PROPOFOL N/A 01/01/2021  ? Procedure: ESOPHAGOGASTRODUODENOSCOPY (EGD) WITH PROPOFOL;  Surgeon: Wilford Corner, MD;  Location: [facility];  Service: Endoscopy;  Laterality: N/A;  ?? ORIF ANKLE FRACTURE Left 08/12/2017  ? Procedure: OPEN REDUCTION INTERNAL FIXATION (ORIF) ANKLE FRACTURE;  Surgeon: Rod Can, MD;  Location: [facility];  Service: Orthopedics;  Laterality: Left;  ?? RIGHT HEART CATH N/A 07/19/2018  ? Procedure: RIGHT HEART CATH;  Surgeon: Jolaine Artist, MD;  Location: [facility];  Service: Cardiovascular;  Laterality: N/A;  ?? RIGHT/LEFT HEART  CATH AND CORONARY ANGIOGRAPHY N/A 01/27/2017  ? Procedure: Right/Left Heart Cath and Coronary Angiography;  Surgeon: Troy Sine, MD;  Location: [facility];  Service: Cardiovascular;  Laterality: N/A;  ? ?Social History  ? ?Social History Narrative  ?? Not on file  ? ?Immunization History  ?Administered Date(s) Administered  ?? Influenza Split 08/15/2017, 08/28/2018, 08/24/2019  ?? Influenza,inj,Quad PF,6+ Mos 12/28/2016, 09/28/2017, 09/04/2018, 08/03/2019, 08/29/2020, 08/03/2021  ?? Influenza,inj,quad, With Preservative 08/09/2014  ?? Influenza-Unspecified 08/15/2016  ?? Moderna Sars-Covid-2 Vaccination 02/11/2020, 03/10/2020, 07/11/2020  ?? Pneumococcal Polysaccharide-23 02/04/2014, 08/31/2016  ?? Pneumococcal-Unspecified 02/13/2013  ?? Tdap 04/25/2013, 02/04/2014  ?? Zoster, Live 05/31/2019, 08/03/2019  ?  ? ?Objective: ?Vital Signs: LMP 08/19/2012   ? ?Physical Exam ?Vitals and nursing note reviewed.  ?Constitutional:   ?   Appearance: She is well-developed.  ?HENT:  ?   Head: Normocephalic and atraumatic.  ?Eyes:  ?   Conjunctiva/sclera: Conjunctivae normal.  ?Cardiovascular:  ?   Rate and Rhythm: Normal rate and regular rhythm.  ?   Heart sounds: Normal heart sounds.  ?Pulmonary:  ?   Effort: Pulmonary effort is normal.  ?   Breath sounds: Normal breath sounds.  ?Abdominal:  ?   General: Bowel sounds are normal.  ?   Palpations: Abdomen is soft.  ?Musculoskeletal:  ?   Cervical back: Normal range of motion.  ?Skin: ?   General: Skin is warm and dry.  ?   Capillary Refill: Capillary refill takes less than 2 seconds.  ?Neurological:  ?   Mental Status: She is alert and oriented to person, place, and time.  ?Psychiatric:     ?   Behavior: Behavior normal.  ?  ? ?Musculoskeletal Exam: *** ? ?CDAI Exam: ?CDAI Score: -- ?Patient Global: --; Provider Global: -- ?Swollen: --; Tender: -- ?Joint Exam 02/18/2022  ? ?No joint exam has been documented for this visit  ? ?There is currently no information  documented on the homunculus. Go to the Rheumatology activity and complete the homunculus joint exam. ? ?Investigation: ?No additional findings. ? ?Imaging: ?No results found. ? ?Recent Labs: ?Lab Results  ?Component Value Date  ? WBC 5.2 08/27/2019  ? HGB 13.3 08/27/2019  ? PLT 164 08/27/2019  ? NA 142 08/27/2019  ? K 2.9 (LL) 08/27/2019  ? CL 100 08/27/2019  ? CO2 34 (H) 08/27/2019  ? GLUCOSE 97 08/27/2019  ? BUN 12 08/27/2019  ? CREATININE 1.04 (H) 08/27/2019  ? BILITOT <0.2 (L) 08/27/2019  ? ALKPHOS 112 08/27/2019  ? AST 12 (L) 08/27/2019  ? ALT 13 08/27/2019  ? PROT 7.5 08/27/2019  ? ALBUMIN 3.9 08/27/2019  ? CALCIUM 10.3 09/23/2020  ? GFRAA >60 08/27/2019  ? ? ?Speciality Comments: No specialty comments available. ? ?Procedures:  ?No procedures performed ?Allergies: Citrus and Lisinopril  ? ?Assessment / Plan:     ?Visit Diagnoses: No diagnosis found. ? ?Orders: ?No orders of the defined types were placed in this encounter. ? ?No orders of the defined types were placed in this encounter. ? ? ?Face-to-face time spent with patient was *** minutes. Greater than 50% of time was spent in counseling and coordination of care. ? ?Follow-Up Instructions: No follow-ups on  file. ? ? ?Earnestine Mealing, CMA ? ?Note - This record has been created using [employer].  ?Chart creation errors have been sought, but may not always  ?have been located. Such creation errors do not reflect on  ?the standard of medical care.  ?

## 2022-02-08 ENCOUNTER — Other Ambulatory Visit: Payer: Self-pay | Admitting: Physician Assistant

## 2022-02-08 NOTE — Telephone Encounter (Signed)
Next Visit: 02/18/2022 ?  ?Last Visit: 11/17/2021 ?  ?Last Fill: 11/11/2021 ? ?Dx: Fibromyalgia ?  ?Current Dose per office note on 11/17/2021: Cymbalta 30 mg daily ? ?Okay to refill Cymbalta?  ?

## 2022-02-09 ENCOUNTER — Ambulatory Visit (HOSPITAL_COMMUNITY): Payer: 59

## 2022-02-11 ENCOUNTER — Ambulatory Visit (HOSPITAL_COMMUNITY): Payer: 59

## 2022-02-16 ENCOUNTER — Ambulatory Visit: Payer: 59 | Admitting: Endocrinology

## 2022-02-16 ENCOUNTER — Ambulatory Visit (HOSPITAL_COMMUNITY): Payer: 59

## 2022-02-18 ENCOUNTER — Ambulatory Visit: Payer: 59 | Admitting: Physician Assistant

## 2022-02-18 DIAGNOSIS — Z87891 Personal history of nicotine dependence: Secondary | ICD-10-CM

## 2022-02-18 DIAGNOSIS — G8929 Other chronic pain: Secondary | ICD-10-CM

## 2022-02-18 DIAGNOSIS — F5101 Primary insomnia: Secondary | ICD-10-CM

## 2022-02-18 DIAGNOSIS — I2510 Atherosclerotic heart disease of native coronary artery without angina pectoris: Secondary | ICD-10-CM

## 2022-02-18 DIAGNOSIS — R5383 Other fatigue: Secondary | ICD-10-CM

## 2022-02-18 DIAGNOSIS — M62838 Other muscle spasm: Secondary | ICD-10-CM

## 2022-02-18 DIAGNOSIS — Z8669 Personal history of other diseases of the nervous system and sense organs: Secondary | ICD-10-CM

## 2022-02-18 DIAGNOSIS — I10 Essential (primary) hypertension: Secondary | ICD-10-CM

## 2022-02-18 DIAGNOSIS — I5032 Chronic diastolic (congestive) heart failure: Secondary | ICD-10-CM

## 2022-02-18 DIAGNOSIS — I272 Pulmonary hypertension, unspecified: Secondary | ICD-10-CM

## 2022-02-18 DIAGNOSIS — D472 Monoclonal gammopathy: Secondary | ICD-10-CM

## 2022-02-18 DIAGNOSIS — M797 Fibromyalgia: Secondary | ICD-10-CM

## 2022-02-18 DIAGNOSIS — Z8709 Personal history of other diseases of the respiratory system: Secondary | ICD-10-CM

## 2022-02-18 DIAGNOSIS — M7061 Trochanteric bursitis, right hip: Secondary | ICD-10-CM

## 2022-02-23 ENCOUNTER — Ambulatory Visit (HOSPITAL_COMMUNITY): Payer: 59

## 2022-02-25 ENCOUNTER — Ambulatory Visit (HOSPITAL_COMMUNITY): Payer: 59

## 2022-03-02 ENCOUNTER — Ambulatory Visit (HOSPITAL_COMMUNITY): Payer: 59

## 2022-03-04 ENCOUNTER — Encounter: Payer: Self-pay | Admitting: Hematology

## 2022-03-04 ENCOUNTER — Ambulatory Visit (HOSPITAL_COMMUNITY): Payer: 59

## 2022-03-09 ENCOUNTER — Other Ambulatory Visit: Payer: Self-pay | Admitting: Pulmonary Disease

## 2022-03-09 ENCOUNTER — Ambulatory Visit (HOSPITAL_COMMUNITY): Payer: 59

## 2022-03-10 ENCOUNTER — Telehealth: Payer: Self-pay | Admitting: Pulmonary Disease

## 2022-03-10 MED ORDER — TRELEGY ELLIPTA 100-62.5-25 MCG/ACT IN AEPB: 1.0000 | INHALATION_SPRAY | Freq: Every day | RESPIRATORY_TRACT | 5 refills | Status: DC

## 2022-03-10 NOTE — Telephone Encounter (Signed)
Rx for pt's Trelegy inhaler has been sent to preferred pharmacy for pt. Called and spoke with pt letting her know this had been done and she verbalized understanding. Nothing further needed. ?

## 2022-03-11 ENCOUNTER — Ambulatory Visit (HOSPITAL_COMMUNITY): Payer: 59

## 2022-03-11 ENCOUNTER — Ambulatory Visit: Payer: 59 | Admitting: Endocrinology

## 2022-03-16 ENCOUNTER — Ambulatory Visit (HOSPITAL_COMMUNITY): Payer: 59

## 2022-03-18 ENCOUNTER — Ambulatory Visit (HOSPITAL_COMMUNITY): Payer: 59

## 2022-03-23 ENCOUNTER — Ambulatory Visit (HOSPITAL_COMMUNITY): Payer: 59

## 2022-03-25 ENCOUNTER — Ambulatory Visit (HOSPITAL_COMMUNITY): Payer: 59

## 2022-03-30 ENCOUNTER — Ambulatory Visit (HOSPITAL_COMMUNITY): Payer: 59

## 2022-04-01 ENCOUNTER — Ambulatory Visit (HOSPITAL_COMMUNITY): Payer: 59

## 2022-04-07 DIAGNOSIS — H2513 Age-related nuclear cataract, bilateral: Secondary | ICD-10-CM | POA: Diagnosis not present

## 2022-04-07 DIAGNOSIS — H401132 Primary open-angle glaucoma, bilateral, moderate stage: Secondary | ICD-10-CM | POA: Diagnosis not present

## 2022-04-09 DIAGNOSIS — H524 Presbyopia: Secondary | ICD-10-CM | POA: Diagnosis not present

## 2022-06-14 ENCOUNTER — Other Ambulatory Visit: Payer: Self-pay | Admitting: Physician Assistant

## 2022-07-21 ENCOUNTER — Other Ambulatory Visit: Payer: Self-pay | Admitting: Rheumatology

## 2022-07-21 ENCOUNTER — Other Ambulatory Visit: Payer: Self-pay | Admitting: Physician Assistant

## 2022-07-27 NOTE — Progress Notes (Signed)
Office Visit Note  Patient: Teresa Roberts             Date of Birth: 05/31/1961           MRN: [id number]             PCP: Jonathon Jordan, MD Referring: Jonathon Jordan, MD Visit Date: 08/10/2022 Occupation: '@GUAROCC'$ @  Subjective:  Generalized pain   History of Present Illness: Teresa Roberts is a 61 y.o. female history of fibromyalgia syndrome, osteoarthritis.  She states she continues to have generalized pain and discomfort from fibromyalgia.  She continues to have pain in her trapezius region and also her lower back.  She states the muscles in her lower extremities are painful.  She also has cramps in her hands.  She states she ran out of Cymbalta.  She takes Skelaxin on a as needed basis.  She states she has been experiencing nocturnal pain.  She would like to be referred to a pain management clinic.  She is dependent on oxygen and is currently on 5 L of oxygen.  She denies any worsening of shortness of breath.  She continues to have intermittent tingling in her hands.  She had nerve conduction velocities done by Dr. Ernestina Patches on December 04, 2021 which showed bilateral mild carpal tunnel syndrome.  Activities of Daily Living:  Patient reports morning stiffness for 1 hour.   Patient Reports nocturnal pain.  Difficulty dressing/grooming: Denies Difficulty climbing stairs: Denies Difficulty getting out of chair: Reports Difficulty using hands for taps, buttons, cutlery, and/or writing: Reports  Review of Systems  Constitutional:  Positive for fatigue.  HENT:  Negative for mouth dryness.   Eyes:  Negative for dryness.  Respiratory:  Positive for shortness of breath.   Cardiovascular:  Positive for palpitations. Negative for chest pain.  Gastrointestinal:  Negative for constipation and diarrhea.  Endocrine: Positive for increased urination.  Genitourinary:  Negative for difficulty urinating.  Musculoskeletal:  Positive for joint pain, gait problem, joint pain, myalgias, muscle tenderness and  myalgias.  Skin:  Negative for color change, rash and sensitivity to sunlight.  Allergic/Immunologic: Negative for susceptible to infections.  Neurological:  Negative for dizziness.  Hematological:  Negative for swollen glands.  Psychiatric/Behavioral:  Positive for depressed mood and sleep disturbance. The patient is nervous/anxious.     PMFS History:  Patient Active Problem List   Diagnosis Date Noted   Personal history of colonic polyps 01/01/2021   Esophageal reflux 01/01/2021   Hypercalcemia 09/23/2020   Hyperparathyroidism, primary ([place]) 08/25/2020   Iron deficiency anemia due to chronic blood loss 06/27/2019   Coronary artery disease involving native coronary artery of native heart without angina pectoris 09/05/2018   Syncope and collapse 09/05/2018   Pain in joint of left elbow 09/04/2018   Chest pain 05/27/2018   HLD (hyperlipidemia) 05/27/2018   Essential hypertension 05/27/2018   Depression 05/27/2018   OSA (obstructive sleep apnea) 05/03/2018   Pulmonary hypertension ([place]) 05/03/2018   Bimalleolar fracture of left ankle 08/12/2017   Acute renal failure (ARF) ([place]) 08/09/2017   Chronic diastolic (congestive) heart failure ([place]) 08/09/2017   Other fatigue 06/13/2017   Primary insomnia 06/13/2017   History of migraine 06/13/2017   COPD (chronic obstructive pulmonary disease) ([place]) 02/15/2017   Tobacco abuse 02/15/2017   Respiratory failure with hypoxia ([place]) 01/28/2017   Migraine 01/28/2017   Daily headache    Hypoxia    Non-ST elevation myocardial infarction (NSTEMI), type 2 01/26/2017   MGUS (monoclonal gammopathy of unknown significance)  01/11/2012   Fibromyalgia 01/11/2012   Bruises easily 01/11/2012    Past Medical History:  Diagnosis Date   CAD (coronary artery disease)    a. 01/2017: cath showing 20% mid RCA stenosis and otherwise normal LAD and LCx with a preserved EF of 55 to 60%; mod pulmonary HTN   COPD (chronic obstructive pulmonary disease) (HCC)     O2 dependent   cpap    Depression    PTSD   Diastolic CHF ([place])    Fibromyalgia    Hyperlipidemia    Hypertension    Migraines     Family History  Problem Relation Age of Onset   Diabetes Mother    Hypertension Mother    CAD Mother    Diabetes Father    Lung cancer Father    Diabetes Sister    Hypercalcemia Neg Hx    Past Surgical History:  Procedure Laterality Date   APPENDECTOMY     BIOPSY  01/01/2021   Procedure: BIOPSY;  Surgeon: Wilford Corner, MD;  Location: [facility];  Service: Endoscopy;;   CESAREAN SECTION     COLONOSCOPY WITH PROPOFOL N/A 01/01/2021   Procedure: COLONOSCOPY WITH PROPOFOL;  Surgeon: Wilford Corner, MD;  Location: [facility];  Service: Endoscopy;  Laterality: N/A;   ESOPHAGOGASTRODUODENOSCOPY (EGD) WITH PROPOFOL N/A 01/01/2021   Procedure: ESOPHAGOGASTRODUODENOSCOPY (EGD) WITH PROPOFOL;  Surgeon: Wilford Corner, MD;  Location: [facility];  Service: Endoscopy;  Laterality: N/A;   ORIF ANKLE FRACTURE Left 08/12/2017   Procedure: OPEN REDUCTION INTERNAL FIXATION (ORIF) ANKLE FRACTURE;  Surgeon: Rod Can, MD;  Location: [facility];  Service: Orthopedics;  Laterality: Left;   RIGHT HEART CATH N/A 07/19/2018   Procedure: RIGHT HEART CATH;  Surgeon: Jolaine Artist, MD;  Location: [facility];  Service: Cardiovascular;  Laterality: N/A;   RIGHT/LEFT HEART CATH AND CORONARY ANGIOGRAPHY N/A 01/27/2017   Procedure: Right/Left Heart Cath and Coronary Angiography;  Surgeon: Troy Sine, MD;  Location: [facility];  Service: Cardiovascular;  Laterality: N/A;   Social History   Social History Narrative   Not on file   Immunization History  Administered Date(s) Administered   Influenza Split 08/15/2017, 08/28/2018, 08/24/2019   Influenza,inj,Quad PF,6+ Mos 12/28/2016, 09/28/2017, 09/04/2018, 08/03/2019, 08/29/2020, 08/03/2021   Influenza,inj,quad, With Preservative 08/09/2014   Influenza-Unspecified 08/15/2016   Moderna  Sars-Covid-2 Vaccination 02/11/2020, 03/10/2020, 07/11/2020   Pneumococcal Polysaccharide-23 02/04/2014, 08/31/2016   Pneumococcal-Unspecified 02/13/2013   Tdap 04/25/2013, 02/04/2014   Zoster, Live 05/31/2019, 08/03/2019     Objective: Vital Signs: BP 111/74 (BP Location: Left Arm, Patient Position: Sitting, Cuff Size: Normal)   Pulse 88   Ht '5\' 6"'$  (1.676 m)   Wt 181 lb (82.1 kg)   LMP 08/19/2012   BMI 29.21 kg/m    Physical Exam Vitals and nursing note reviewed.  Constitutional:      Appearance: She is well-developed.  HENT:     Head: Normocephalic and atraumatic.  Eyes:     Conjunctiva/sclera: Conjunctivae normal.  Cardiovascular:     Rate and Rhythm: Normal rate and regular rhythm.     Heart sounds: Normal heart sounds.  Pulmonary:     Effort: Pulmonary effort is normal.     Breath sounds: Normal breath sounds.  Abdominal:     General: Bowel sounds are normal.     Palpations: Abdomen is soft.  Musculoskeletal:     Cervical back: Normal range of motion.  Lymphadenopathy:     Cervical: No cervical adenopathy.  Skin:  General: Skin is warm and dry.     Capillary Refill: Capillary refill takes less than 2 seconds.  Neurological:     Mental Status: She is alert and oriented to person, place, and time.  Psychiatric:        Behavior: Behavior normal.      Musculoskeletal Exam: Limited lateral rotation of the cervical spine.  She tenderness over bilateral trapezius region.  She had difficulty raising her arms due to trapezius discomfort.  Elbow joints, wrist joints, MCPs PIPs and DIPs with good range of motion with no synovitis.  Hip joints could not be assessed in the sitting position.  Knee joints were in good range of motion without any warmth swelling or effusion.  There was no tenderness over ankles or MTPs.  CDAI Exam: CDAI Score: -- Patient Global: --; Provider Global: -- Swollen: --; Tender: -- Joint Exam 08/10/2022   No joint exam has been documented for  this visit   There is currently no information documented on the homunculus. Go to the Rheumatology activity and complete the homunculus joint exam.  Investigation: No additional findings.  Imaging: No results found.  Recent Labs: Lab Results  Component Value Date   WBC 5.2 08/27/2019   HGB 13.3 08/27/2019   PLT 164 08/27/2019   NA 142 08/27/2019   K 2.9 (LL) 08/27/2019   CL 100 08/27/2019   CO2 34 (H) 08/27/2019   GLUCOSE 97 08/27/2019   BUN 12 08/27/2019   CREATININE 1.04 (H) 08/27/2019   BILITOT <0.2 (L) 08/27/2019   ALKPHOS 112 08/27/2019   AST 12 (L) 08/27/2019   ALT 13 08/27/2019   PROT 7.5 08/27/2019   ALBUMIN 3.9 08/27/2019   CALCIUM 10.3 09/23/2020   GFRAA >60 08/27/2019    Speciality Comments: No specialty comments available.  Procedures:  Trigger Point Inj  Date/Time: 08/10/2022 8:31 AM  Performed by: Bo Merino, MD Authorized by: Bo Merino, MD   Consent Given by:  Patient Site marked: the procedure site was marked   Timeout: prior to procedure the correct patient, procedure, and site was verified   Indications:  Muscle spasm and pain Total # of Trigger Points:  2 Location: neck   Needle Size:  27 G Approach:  Dorsal Medications #1:  0.5 mL lidocaine 1 %; 10 mg triamcinolone acetonide 40 MG/ML Medications #2:  0.5 mL lidocaine 1 %; 10 mg triamcinolone acetonide 40 MG/ML Patient tolerance:  Patient tolerated the procedure well with no immediate complications  Allergies: Citrus and Lisinopril   Assessment / Plan:     Visit Diagnoses: Fibromyalgia-she continues to have generalized pain and discomfort from fibromyalgia syndrome.  She has been taking Cymbalta 30 mg p.o. daily which has been helpful.  She states she ran out of Cymbalta.  We will refill Cymbalta today.  She also takes Skelaxin 400 mg p.o. twice daily as needed.  She states she discontinued tizanidine as it was causing headaches.  Despite taking all the medications she  continues to have generalized pain and discomfort.  She states she has nocturnal pain.  She would prefer to be referred to pain management clinic.  Patient states she will be getting labs with her PCP tomorrow for physical.  She will forward the lab results to [place].  Trapezius muscle spasm-she bilateral trapezius spasm.  She was having increased pain and discomfort.  She was having difficulty raising her arms.  After informed consent was obtained bilateral trapezius region were injected with lidocaine and Kenalog as described  above.  Patient tolerated the procedure well.    Other fatigue-she continues to have fatigue.  Primary insomnia -she is on trazodone 100 mg at bedtime for insomnia which is helpful per patient.  She still has nocturnal pain.  Bilateral carpal tunnel syndrome-she had nerve conduction velocities by Dr. Ernestina Patches in January 2023 which showed bilateral mild carpal tunnel syndrome.  She states her symptoms are intermittent.  She has been using carpal tunnel braces as needed.  Chronic left-sided low back pain without sciatica -she continues to have lower back pain.  I will refer her to pain management.  X-rays of the lumbar spine were unremarkable on 07/25/19.  A handout on back exercises was given.  Patient declined physical therapy.  I will refer her to pain management.  Other medical problems are listed as follows:  History of migraine  Pulmonary hypertension ([place])  Essential hypertension  History of COPD-she is on 5 L of oxygen.  She is oxygen dependent.  Coronary artery disease involving native coronary artery of native heart without angina pectoris  Chronic diastolic (congestive) heart failure (H[facility])  MGUS (monoclonal gammopathy of unknown significance)  Former smoker  Orders: Orders Placed This Encounter  Procedures   Trigger Point Inj   Ambulatory referral to Physical Medicine Rehab   Meds ordered this encounter  Medications   DULoxetine (CYMBALTA) 30 MG capsule     Sig: TAKE 1 CAPSULE BY MOUTH EVERYDAY AT BEDTIME    Dispense:  90 capsule    Refill:  0    Follow-Up Instructions: Return in about 6 months (around 02/08/2023) for Osteoarthritis, FMS.   Bo Merino, MD  Note - This record has been created using Editor, commissioning.  Chart creation errors have been sought, but may not always  have been located. Such creation errors do not reflect on  the standard of medical care.

## 2022-08-10 ENCOUNTER — Ambulatory Visit: Payer: Medicare HMO | Attending: Rheumatology | Admitting: Rheumatology

## 2022-08-10 ENCOUNTER — Encounter: Payer: Self-pay | Admitting: Rheumatology

## 2022-08-10 VITALS — BP 111/74 | HR 88 | Ht 66.0 in | Wt 181.0 lb

## 2022-08-10 DIAGNOSIS — M7061 Trochanteric bursitis, right hip: Secondary | ICD-10-CM

## 2022-08-10 DIAGNOSIS — Z8709 Personal history of other diseases of the respiratory system: Secondary | ICD-10-CM

## 2022-08-10 DIAGNOSIS — M545 Low back pain, unspecified: Secondary | ICD-10-CM | POA: Diagnosis not present

## 2022-08-10 DIAGNOSIS — I2510 Atherosclerotic heart disease of native coronary artery without angina pectoris: Secondary | ICD-10-CM

## 2022-08-10 DIAGNOSIS — F5101 Primary insomnia: Secondary | ICD-10-CM | POA: Diagnosis not present

## 2022-08-10 DIAGNOSIS — D472 Monoclonal gammopathy: Secondary | ICD-10-CM

## 2022-08-10 DIAGNOSIS — M62838 Other muscle spasm: Secondary | ICD-10-CM

## 2022-08-10 DIAGNOSIS — R5383 Other fatigue: Secondary | ICD-10-CM | POA: Diagnosis not present

## 2022-08-10 DIAGNOSIS — I10 Essential (primary) hypertension: Secondary | ICD-10-CM

## 2022-08-10 DIAGNOSIS — G5603 Carpal tunnel syndrome, bilateral upper limbs: Secondary | ICD-10-CM

## 2022-08-10 DIAGNOSIS — I272 Pulmonary hypertension, unspecified: Secondary | ICD-10-CM

## 2022-08-10 DIAGNOSIS — M797 Fibromyalgia: Secondary | ICD-10-CM | POA: Diagnosis not present

## 2022-08-10 DIAGNOSIS — Z8669 Personal history of other diseases of the nervous system and sense organs: Secondary | ICD-10-CM

## 2022-08-10 DIAGNOSIS — Z87891 Personal history of nicotine dependence: Secondary | ICD-10-CM

## 2022-08-10 DIAGNOSIS — G8929 Other chronic pain: Secondary | ICD-10-CM

## 2022-08-10 DIAGNOSIS — I5032 Chronic diastolic (congestive) heart failure: Secondary | ICD-10-CM

## 2022-08-10 MED ORDER — TRIAMCINOLONE ACETONIDE 40 MG/ML IJ SUSP
10.0000 mg | INTRAMUSCULAR | Status: AC | PRN
  Administered 2022-08-10: 10 mg via INTRAMUSCULAR

## 2022-08-10 MED ORDER — LIDOCAINE HCL 1 % IJ SOLN
0.5000 mL | INTRAMUSCULAR | Status: AC | PRN
  Administered 2022-08-10: .5 mL

## 2022-08-10 MED ORDER — DULOXETINE HCL 30 MG PO CPEP: ORAL_CAPSULE | ORAL | 0 refills | Status: DC

## 2022-08-10 NOTE — Patient Instructions (Signed)

## 2022-08-11 ENCOUNTER — Encounter: Payer: Self-pay | Admitting: Pulmonary Disease

## 2022-08-11 ENCOUNTER — Ambulatory Visit (INDEPENDENT_AMBULATORY_CARE_PROVIDER_SITE_OTHER): Payer: Medicare HMO | Admitting: Pulmonary Disease

## 2022-08-11 VITALS — BP 116/62 | HR 74 | Temp 98.2°F | Ht 66.0 in | Wt 181.4 lb

## 2022-08-11 DIAGNOSIS — J9611 Chronic respiratory failure with hypoxia: Secondary | ICD-10-CM

## 2022-08-11 DIAGNOSIS — J449 Chronic obstructive pulmonary disease, unspecified: Secondary | ICD-10-CM | POA: Diagnosis not present

## 2022-08-11 DIAGNOSIS — I272 Pulmonary hypertension, unspecified: Secondary | ICD-10-CM | POA: Diagnosis not present

## 2022-08-11 DIAGNOSIS — E669 Obesity, unspecified: Secondary | ICD-10-CM | POA: Diagnosis not present

## 2022-08-11 DIAGNOSIS — Z0000 Encounter for general adult medical examination without abnormal findings: Secondary | ICD-10-CM | POA: Diagnosis not present

## 2022-08-11 DIAGNOSIS — Z23 Encounter for immunization: Secondary | ICD-10-CM

## 2022-08-11 DIAGNOSIS — Z79899 Other long term (current) drug therapy: Secondary | ICD-10-CM | POA: Diagnosis not present

## 2022-08-11 DIAGNOSIS — J432 Centrilobular emphysema: Secondary | ICD-10-CM

## 2022-08-11 DIAGNOSIS — I25119 Atherosclerotic heart disease of native coronary artery with unspecified angina pectoris: Secondary | ICD-10-CM | POA: Diagnosis not present

## 2022-08-11 DIAGNOSIS — E538 Deficiency of other specified B group vitamins: Secondary | ICD-10-CM | POA: Diagnosis not present

## 2022-08-11 DIAGNOSIS — I2510 Atherosclerotic heart disease of native coronary artery without angina pectoris: Secondary | ICD-10-CM | POA: Diagnosis not present

## 2022-08-11 DIAGNOSIS — E210 Primary hyperparathyroidism: Secondary | ICD-10-CM | POA: Diagnosis not present

## 2022-08-11 DIAGNOSIS — J9691 Respiratory failure, unspecified with hypoxia: Secondary | ICD-10-CM | POA: Diagnosis not present

## 2022-08-11 MED ORDER — TRELEGY ELLIPTA 100-62.5-25 MCG/ACT IN AEPB: 1.0000 | INHALATION_SPRAY | Freq: Every day | RESPIRATORY_TRACT | 5 refills | Status: DC

## 2022-08-11 NOTE — Patient Instructions (Addendum)
x ambulatory saturation to qualify for oxygen  X Flu and RSV vaccine x refills on Trelegy

## 2022-08-11 NOTE — Assessment & Plan Note (Signed)
We discussed COPD action plan and signs and symptoms of COPD exacerbation. Refills will be provided on Trelegy I recommended center-based rehab, she has completed this in the past and prefers a home-based program now.  I encouraged her to stay active

## 2022-08-11 NOTE — Assessment & Plan Note (Addendum)
We will requalify her for oxygen Discussed vaccination including flu, RSV and COVID booster

## 2022-08-11 NOTE — Progress Notes (Signed)
   Subjective:    Patient ID: Teresa Roberts, female    DOB: [birth year], 61 y.o.   MRN: [id number]  HPI  61 yo heavy smoker for FU of COPD, pulmonary hypertension and chronic respiratory failure with hypoxia - Hypoxia quite out of proportion to degree of airway obstruction or pulmonary hypertension -She uses 5 L pulse on exertion She smoked > 30-pack-years   PMH - Takes trazodone for sleep and modafinil in the daytime COVID infection 08/2020  07/2021 On ambulation, she was not able to maintain saturations on 3 L pulse or even after increasing to 5 L pulse and required 3 L continuous Sample of trelgy 100  1 year follow-up visit. Trelegy has worked well for her. Denies interim chest colds or exacerbations.  We reviewed low-dose CT chest from last year. She needs to requalify for oxygen surprisingly CT showed only mild emphysema, on PFTs with DLCO is very low Oxygen saturation was 83% on room air and she was able to maintain saturation with ambulation on room air  Significant tests/ events reviewed  08/2021 LDCt mild emphysema, RADS-1 CT angiogram 01/2017  neg for pulmonary embolism .  ABG showed mild hypercarbia with 7.36/45/62 and 7.33/51/70 .    Echo 03/2020 nml LVEF, gr2 DD , RVSF nml Cath 07/2018  PA 50/19, PVR 3.6 WU, RA13   PFTs 07/2021 showed drop in FEV1 to 0.97/43%, no bronchodilator response, TLC maintained, DLCO 31%/6.53 Spirometry 02/2017  ratio of 69, FEV1 of 1.15-52% and FVC of 60%   NPSG ( [facility])  per report - AHI of 16/hour >> auto CPAP 12-20 cm>> avg 16 cm  Review of Systems neg for any significant sore throat, dysphagia, itching, sneezing, nasal congestion or excess/ purulent secretions, fever, chills, sweats, unintended wt loss, pleuritic or exertional cp, hempoptysis, orthopnea pnd or change in chronic leg swelling. Also denies presyncope, palpitations, heartburn, abdominal pain, nausea, vomiting, diarrhea or change in bowel or urinary habits, dysuria,hematuria, rash,  arthralgias, visual complaints, headache, numbness weakness or ataxia.     Objective:   Physical Exam  Gen. Pleasant, well-nourished, in no distress , on O2 ENT - no thrush, no pallor/icterus,no post nasal drip Neck: No JVD, no thyromegaly, no carotid bruits Lungs: no use of accessory muscles, no dullness to percussion, decreased BL  without rales or rhonchi  Cardiovascular: Rhythm regular, heart sounds  normal, no murmurs or gallops, no peripheral edema Musculoskeletal: No deformities, no cyanosis or clubbing        Assessment & Plan:

## 2022-08-13 DIAGNOSIS — Z79899 Other long term (current) drug therapy: Secondary | ICD-10-CM | POA: Diagnosis not present

## 2022-08-18 ENCOUNTER — Other Ambulatory Visit: Payer: Self-pay | Admitting: Family Medicine

## 2022-08-18 DIAGNOSIS — E0590 Thyrotoxicosis, unspecified without thyrotoxic crisis or storm: Secondary | ICD-10-CM

## 2022-08-20 ENCOUNTER — Telehealth: Payer: Self-pay | Admitting: *Deleted

## 2022-08-20 NOTE — Telephone Encounter (Signed)
Labs received from: Dr. Alessandra Grout  Drawn on:08/13/2022  Reviewed by:Hazel Sams, PA-C  Labs drawn: CBC, CMP, Vitamin B-12, TSH, Vitamin D, PTH Intact, Lipid Panel  Results:Hgb 11.8  MCH 25.1  MCHC 30.9  RDW 19.0  Calcium 10.8  Vitamin B 12: 95  Calc LDL: 122  Non-HDL: 143  Patient in on Cymbalta 30 mg po at bedtime.

## 2022-08-24 ENCOUNTER — Telehealth: Payer: Self-pay | Admitting: Pulmonary Disease

## 2022-08-24 DIAGNOSIS — J9611 Chronic respiratory failure with hypoxia: Secondary | ICD-10-CM

## 2022-08-25 DIAGNOSIS — E538 Deficiency of other specified B group vitamins: Secondary | ICD-10-CM | POA: Diagnosis not present

## 2022-08-25 NOTE — Telephone Encounter (Signed)
Order placed for oxygen. Patient walked in office on 08/11/22. Nothing further needed

## 2022-08-30 ENCOUNTER — Encounter: Payer: Self-pay | Admitting: Hematology

## 2022-08-30 ENCOUNTER — Encounter: Payer: Self-pay | Admitting: Physical Medicine and Rehabilitation

## 2022-09-02 ENCOUNTER — Encounter: Payer: Self-pay | Admitting: Hematology

## 2022-09-06 ENCOUNTER — Ambulatory Visit (HOSPITAL_BASED_OUTPATIENT_CLINIC_OR_DEPARTMENT_OTHER): Payer: Medicare HMO | Admitting: Cardiology

## 2022-09-07 ENCOUNTER — Telehealth: Payer: Self-pay | Admitting: Pulmonary Disease

## 2022-09-08 ENCOUNTER — Other Ambulatory Visit: Payer: Self-pay

## 2022-09-08 DIAGNOSIS — J439 Emphysema, unspecified: Secondary | ICD-10-CM

## 2022-09-08 DIAGNOSIS — J9611 Chronic respiratory failure with hypoxia: Secondary | ICD-10-CM

## 2022-09-08 DIAGNOSIS — E538 Deficiency of other specified B group vitamins: Secondary | ICD-10-CM | POA: Diagnosis not present

## 2022-09-08 NOTE — Telephone Encounter (Signed)
Called Pt to confirm that she wants her order for POC to be changed from [place] ( [facility] ) to [facility]. Nothing further needed.

## 2022-09-08 NOTE — Progress Notes (Signed)
Dme

## 2022-09-13 ENCOUNTER — Encounter: Payer: Self-pay | Admitting: Physical Medicine and Rehabilitation

## 2022-09-13 ENCOUNTER — Encounter: Payer: Medicare HMO | Attending: Physical Medicine and Rehabilitation | Admitting: Physical Medicine and Rehabilitation

## 2022-09-13 VITALS — BP 111/81 | HR 104 | Ht 66.0 in | Wt 172.0 lb

## 2022-09-13 DIAGNOSIS — M797 Fibromyalgia: Secondary | ICD-10-CM | POA: Insufficient documentation

## 2022-09-13 DIAGNOSIS — Z5181 Encounter for therapeutic drug level monitoring: Secondary | ICD-10-CM | POA: Diagnosis not present

## 2022-09-13 DIAGNOSIS — Z79891 Long term (current) use of opiate analgesic: Secondary | ICD-10-CM | POA: Insufficient documentation

## 2022-09-13 DIAGNOSIS — G894 Chronic pain syndrome: Secondary | ICD-10-CM | POA: Diagnosis not present

## 2022-09-13 DIAGNOSIS — M5386 Other specified dorsopathies, lumbar region: Secondary | ICD-10-CM | POA: Insufficient documentation

## 2022-09-13 NOTE — Assessment & Plan Note (Signed)
Currently taking gabapentin 1600 BID (prescribed 800 mg BID) with some benefit. BUN and Cr WNL 9/23 per records.   Once urine drug screen has resulted, if it is as expected, we will plan to transition from gabapentin to Lyrica 150 mg BID.  Patient already on Cymbalta 30 mg daily.

## 2022-09-13 NOTE — Patient Instructions (Addendum)
You will get xrays of your back to evaluate for arthritis and potential sites of nerve impingement. I will call you with these results once they are available.  Once your urine drug screen has resulted, if it is as expected, we will plan to taper you from gabapentin to Lyrica. I will call with instructions for this.   Call [facility] and schedule your yearly steroid injection.   We will follow up in one month to evaluate how things are going.

## 2022-09-13 NOTE — Assessment & Plan Note (Signed)
No recent imaging; will obtain Lumbar xrays 2-3 views to evaluate for arthritis, disc disease, and spondylosis.   Given excellent prior benefits, urged patient to call [facility] and schedule yearly epidural steroid injection. If unable to get in, could also internally refer to Dr. Letta Pate.   We will follow up in one month after imaging and medication changes (see below).

## 2022-09-13 NOTE — Progress Notes (Signed)
Subjective:    Patient ID: Teresa Roberts, female    DOB: 08/20/1961, 61 y.o.   MRN: [id number]  Teresa Roberts is a 61 y.o. year old female  who  has a past medical history of CAD (coronary artery disease), COPD (chronic obstructive pulmonary disease) ([place]), cpap, Depression, Diastolic CHF ([place]), Fibromyalgia, Hyperlipidemia, Hypertension, and Migraines.   They are presenting to PM&R clinic as a new patient for pain management evaluation. They were referred by Dr. Estanislado Pandy for treatment of chronic pain.   Per her last documentation:  Teresa Roberts is a 61 y.o. female history of fibromyalgia syndrome, osteoarthritis.  She states she continues to have generalized pain and discomfort from fibromyalgia.  She continues to have pain in her trapezius region and also her lower back.  She states the muscles in her lower extremities are painful.  She also has cramps in her hands.  She states she ran out of Cymbalta.  She takes Skelaxin on a as needed basis.  She states she has been experiencing nocturnal pain.  She would like to be referred to a pain management clinic.  She is dependent on oxygen and is currently on 5 L of oxygen.  She denies any worsening of shortness of breath.  She continues to have intermittent tingling in her hands.  She had nerve conduction velocities done by Dr. Ernestina Patches on December 04, 2021 which showed bilateral mild carpal tunnel syndrome.  Source: Low back  Inciting incident: She fell with an Designer[employer] multimedia when she was in [employer] (approx. 61 y/o), which initially started the pain, and it has increased over the years. Over the last 2-3 years, it has ramped up considerably.  Duration of pain: constant Description of pain: Aching; radiated down her left leg on the lateral thigh into the leg and the whole top of the foot.  Severity: On average 7/10. At worst 10/10. At best 6/10. Exacerbating factors: Any movement, standing or walking for long periods.  Remitting factors: Rest, laying while  seated up a little bit. Leaning forward on a shopping cart.  Red flag symptoms: Patient denies saddle anesthesia, loss of bowel or bladder continence, new weakness (Hx , + L leg numbness/tingling, or + occassional pain waking up at nighttime.  Medications tried: Nsaids ( ? effect): Was told not to take aleve because she is on aspirin once daily and has a Hx MI.  Tylenol : Has never tried.  Opiates  ( some effect): Is intermittently on Percocet and Norco through her PCP; last 09/2021, unsure why it was stopped. Denies any side effect.  Gabapentin / Lyrica  ( some effect): On gabapentin 800 mg BID, she's been doubling these with some benefit but overall feels they mostly make her groggy.  TCAs:  Never tried SNRIs  ( no effect): Takes Cymbalta 30 mg daily; does nto notice a benefit for her back, mostly for migraines.  Other  ( no effect): Has tizanidine, but it gives her a headache.   Other treatments: PT/OT  ( no effect): At the [facility] "awhile ago"; no benefit.  Accupuncture/chiropractor/massage  ( good effect): Had acupuncture in the past, with benefit, but ended up in the hospital with an infection the last time she did it.  Injections ( good effect): Was seeing [facility] for back injections; most recently 1 year ago. States she usually gets them yearly and they help the entire year. Has not made a follow up for this year yet, no particular reason why.  Surgery: none  Other: none  HPI Pain Inventory Average Pain 5 Pain Right Now 7 My pain is aching  In the last 24 hours, has pain interfered with the following? General activity 7 Relation with others 6 Enjoyment of life 8 What TIME of day is your pain at its worst? daytime Sleep (in general) Good  Pain is worse with: walking, bending, sitting, standing, and some activites Pain improves with: rest, medication, and injections Relief from Meds: 6  how many minutes can you walk? 15 ability to climb steps?  no do you drive?   yes transfers alone  disabled: date disabled 2014  numbness tingling spasms dizziness confusion anxiety  Any changes since last visit?  no  Any changes since last visit?  no    Family History  Problem Relation Age of Onset   Diabetes Mother    Hypertension Mother    CAD Mother    Diabetes Father    Lung cancer Father    Diabetes Sister    Hypercalcemia Neg Hx    Social History   Socioeconomic History   Marital status: Married    Spouse name: Not on file   Number of children: Not on file   Years of education: Not on file   Highest education level: Not on file  Occupational History   Not on file  Tobacco Use   Smoking status: Former    Packs/day: 1.00    Years: 39.00    Total pack years: 39.00    Types: Cigarettes    Start date: 07/21/2018   Smokeless tobacco: Never  Vaping Use   Vaping Use: Never used  Substance and Sexual Activity   Alcohol use: No   Drug use: Not Currently    Types: Marijuana   Sexual activity: Not on file  Other Topics Concern   Not on file  Social History Narrative   Not on file   Social Determinants of Health   Financial Resource Strain: Not on file  Food Insecurity: Not on file  Transportation Needs: Not on file  Physical Activity: Not on file  Stress: Not on file  Social Connections: Not on file   Past Surgical History:  Procedure Laterality Date   APPENDECTOMY     BIOPSY  01/01/2021   Procedure: BIOPSY;  Surgeon: Wilford Corner, MD;  Location: [facility];  Service: Endoscopy;;   CESAREAN SECTION     COLONOSCOPY WITH PROPOFOL N/A 01/01/2021   Procedure: COLONOSCOPY WITH PROPOFOL;  Surgeon: Wilford Corner, MD;  Location: [facility];  Service: Endoscopy;  Laterality: N/A;   ESOPHAGOGASTRODUODENOSCOPY (EGD) WITH PROPOFOL N/A 01/01/2021   Procedure: ESOPHAGOGASTRODUODENOSCOPY (EGD) WITH PROPOFOL;  Surgeon: Wilford Corner, MD;  Location: [facility];  Service: Endoscopy;  Laterality: N/A;   ORIF ANKLE FRACTURE  Left 08/12/2017   Procedure: OPEN REDUCTION INTERNAL FIXATION (ORIF) ANKLE FRACTURE;  Surgeon: Rod Can, MD;  Location: [facility];  Service: Orthopedics;  Laterality: Left;   RIGHT HEART CATH N/A 07/19/2018   Procedure: RIGHT HEART CATH;  Surgeon: Jolaine Artist, MD;  Location: [facility];  Service: Cardiovascular;  Laterality: N/A;   RIGHT/LEFT HEART CATH AND CORONARY ANGIOGRAPHY N/A 01/27/2017   Procedure: Right/Left Heart Cath and Coronary Angiography;  Surgeon: Troy Sine, MD;  Location: [place];  Service: Cardiovascular;  Laterality: N/A;   Past Medical History:  Diagnosis Date   CAD (coronary artery disease)    a. 01/2017: cath showing 20% mid RCA stenosis and otherwise normal LAD and LCx with  a preserved EF of 55 to 60%; mod pulmonary HTN   COPD (chronic obstructive pulmonary disease) ([facility])    O2 dependent   cpap    Depression    PTSD   Diastolic CHF ([facility])    Fibromyalgia    Hyperlipidemia    Hypertension    Migraines    BP 111/81   Pulse (!) 104   Ht '5\' 6"'$  (1.676 m)   Wt 172 lb (78 kg)   LMP 08/19/2012   SpO2 93% Comment: 5 liters  BMI 27.76 kg/m   Opioid Risk Score:   Fall Risk Score:  `1  Depression screen [facility] 2/9     09/13/2022    1:11 PM 07/28/2018   10:17 AM  Depression screen PHQ 2/9  Decreased Interest 2 3  Down, Depressed, Hopeless 1 2  PHQ - 2 Score 3 5  Altered sleeping 0 0  Tired, decreased energy 3 3  Change in appetite 1 3  Feeling bad or failure about yourself  0 2  Trouble concentrating 1 2  Moving slowly or fidgety/restless 1 3  Suicidal thoughts 0 0  PHQ-9 Score 9 18  Difficult doing work/chores Very difficult Very difficult      Review of Systems  Musculoskeletal:  Positive for back pain.  All other systems reviewed and are negative.     Objective:   Physical Exam  Of note, exam was truncated due to patient's oxygen concentrator indicating low battery and patient endorsing need to leave to charge it in  her car.   Constitution: Appropriate appearance for age. No apparent distress. HEENT: PERRL, EOMI grossly intact.  Resp: Mild bilateral wheezing. +nasal canula.  Cardio: RRR. No mumurs, rubs, or gallops. No peripheral edema. Abdomen: Nondistended. Nontender. +bowel sounds. Psych: Appropriate mood, flat affect. Neuro: AAOx4. No apparent deficits   Neurologic Exam:   Sensory exam: revealed normal sensation in all dermatomal regions in bilateral lower extremities.  Motor exam: strength normal in all myotomal regions of bilateral upper and lower extremities.  Back Exam:    Palpation: Palpatory exam revealed ttp at the thoracic and lumbar spinous processes, bilateral paraspinals, PSIS,  SI joints. There was no evidence of spasm. No trigger points were noted.     ROM:  ROM normal in back flexion and extension.  Special/provocative testing:    Slump test: Negative   Forward bending: improved back pain   Facet loading: + generalized back pain   TTP at paraspinals: + mild throughout       Assessment & Plan:   Teresa Roberts is a 61 y.o. year old female  who  has a past medical history of CAD (coronary artery disease), COPD (chronic obstructive pulmonary disease) ([place]), cpap, Depression, Diastolic CHF ([place]), Fibromyalgia, Hyperlipidemia, Hypertension, and Migraines.   They are presenting to PM&R clinic as a new patient for pain management evaluation. They were referred by Dr. Estanislado Pandy for treatment of chronic pain due to likely L L4 -L5 radiculopathy and fibromyalgia, with possible facet arthropathy.    Chronic pain syndrome Assessment & Plan: Indication for chronic opioid: Chronic back pain, fibromyalgia Medication and dose: none current # pills per month: 0 Last UDS date: Today, 09/13/22 Opioid Treatment Agreement signed (Y/N): Y, 08/2022 Opioid Treatment Agreement last reviewed with patient:   NCCSRS/PDMP reviewed this encounter (include red flags): Y  No current opiate script, has  intermittently tolerated low dose opiates well. Will try ESI and transition to Lyrica as below. If no benefit or additional modalities  needed at follow up, can consider initiating low-risk option such as tramadol or buprenorphine. Alternatively, could consider low-dose naltrexone trial given respiratory dz.   Orders: -     Chiropodist  Encounter for therapeutic drug monitoring -     [facility],+Antidepr,UR  Fibromyalgia Assessment & Plan: Currently taking gabapentin 1600 BID (prescribed 800 mg BID) with some benefit. BUN and Cr WNL 9/23 per records.   Once urine drug screen has resulted, if it is as expected, we will plan to transition from gabapentin to Lyrica 150 mg BID.  Patient already on Cymbalta 30 mg daily.   Sciatica of left side associated with disorder of lumbar spine Assessment & Plan: No recent imaging; will obtain Lumbar xrays 2-3 views to evaluate for arthritis, disc disease, and spondylosis.   Given excellent prior benefits, urged patient to call [facility] and schedule yearly epidural steroid injection. If unable to get in, could also internally refer to Dr. Letta Pate.   We will follow up in one month after imaging and medication changes (see below).  Orders: -     DG Lumbar Spine 2-3 Views; Future -     [facility],+Antidepr,UR  Encounter for long-term opiate analgesic use -     [facility],+Antidepr,UR     Gertie Gowda, DO 09/13/2022

## 2022-09-13 NOTE — Assessment & Plan Note (Signed)
Indication for chronic opioid: Chronic back pain, fibromyalgia Medication and dose: none current # pills per month: 0 Last UDS date: Today, 09/13/22 Opioid Treatment Agreement signed (Y/N): Y, 08/2022 Opioid Treatment Agreement last reviewed with patient:   NCCSRS/PDMP reviewed this encounter (include red flags): Y  No current opiate script, has intermittently tolerated low dose opiates well. Will try ESI and transition to Lyrica as below. If no benefit or additional modalities needed at follow up, can consider initiating low-risk option such as tramadol or buprenorphine. Alternatively, could consider low-dose naltrexone trial given respiratory dz.

## 2022-09-14 DIAGNOSIS — M5416 Radiculopathy, lumbar region: Secondary | ICD-10-CM | POA: Diagnosis not present

## 2022-09-17 ENCOUNTER — Telehealth (HOSPITAL_BASED_OUTPATIENT_CLINIC_OR_DEPARTMENT_OTHER): Payer: Self-pay | Admitting: Cardiology

## 2022-09-17 NOTE — Telephone Encounter (Signed)
Spoke with patient @ 3:33 pm regarding new appointment date, time and provider.  Dr. Harrell Gave not in 09/27/22---Patient is scheduled for Friday 10/01/22 at 10:55 am with Laurann Montana, NP.  Patient voiced her understanding and I will mail information to her

## 2022-09-18 LAB — TOXASSURE SELECT,+ANTIDEPR,UR

## 2022-09-22 DIAGNOSIS — E538 Deficiency of other specified B group vitamins: Secondary | ICD-10-CM | POA: Diagnosis not present

## 2022-09-27 ENCOUNTER — Ambulatory Visit (HOSPITAL_BASED_OUTPATIENT_CLINIC_OR_DEPARTMENT_OTHER): Payer: Medicare HMO | Admitting: Cardiology

## 2022-09-28 ENCOUNTER — Telehealth: Payer: Self-pay | Admitting: *Deleted

## 2022-09-28 DIAGNOSIS — M5416 Radiculopathy, lumbar region: Secondary | ICD-10-CM | POA: Diagnosis not present

## 2022-09-28 NOTE — Telephone Encounter (Signed)
Urine drug screen for this encounter is consistent for prescribed medication 

## 2022-09-29 ENCOUNTER — Telehealth: Payer: Self-pay | Admitting: Pulmonary Disease

## 2022-09-29 MED ORDER — TRELEGY ELLIPTA 200-62.5-25 MCG/ACT IN AEPB: 1.0000 | INHALATION_SPRAY | Freq: Every day | RESPIRATORY_TRACT | 0 refills | Status: DC

## 2022-09-29 NOTE — Telephone Encounter (Signed)
Patient called to request some samples of her Trelegy until her insurance changes.  Please advise and call patient to let her know when she would be able to pick them up.  CB# [phone]

## 2022-09-29 NOTE — Telephone Encounter (Signed)
Called and spoke with patient. Advised patient we had a trelegy 200 sample she could come pick up. Patient verbalized understanding and stated she will pick the sample up on 09/30/2022. Advised patient it would be at the front desk for her.   Nothing further needed.

## 2022-10-01 ENCOUNTER — Ambulatory Visit (HOSPITAL_BASED_OUTPATIENT_CLINIC_OR_DEPARTMENT_OTHER): Payer: Medicare HMO | Admitting: Family

## 2022-10-11 ENCOUNTER — Encounter: Payer: Medicare HMO | Admitting: Physical Medicine and Rehabilitation

## 2022-10-19 ENCOUNTER — Ambulatory Visit
Admission: RE | Admit: 2022-10-19 | Discharge: 2022-10-19 | Disposition: A | Discharge status: Home / Self Care | Payer: Medicare HMO | Source: Ambulatory Visit | Attending: Family Medicine | Admitting: Family Medicine

## 2022-10-19 ENCOUNTER — Other Ambulatory Visit: Payer: Self-pay | Admitting: Family Medicine

## 2022-10-19 DIAGNOSIS — Z1231 Encounter for screening mammogram for malignant neoplasm of breast: Secondary | ICD-10-CM | POA: Diagnosis not present

## 2022-10-19 DIAGNOSIS — Z780 Asymptomatic menopausal state: Secondary | ICD-10-CM | POA: Diagnosis not present

## 2022-10-19 DIAGNOSIS — M8589 Other specified disorders of bone density and structure, multiple sites: Secondary | ICD-10-CM | POA: Diagnosis not present

## 2022-10-19 DIAGNOSIS — M810 Age-related osteoporosis without current pathological fracture: Secondary | ICD-10-CM | POA: Diagnosis not present

## 2022-10-19 DIAGNOSIS — E0590 Thyrotoxicosis, unspecified without thyrotoxic crisis or storm: Secondary | ICD-10-CM

## 2022-10-20 DIAGNOSIS — E210 Primary hyperparathyroidism: Secondary | ICD-10-CM | POA: Diagnosis not present

## 2022-10-20 DIAGNOSIS — E538 Deficiency of other specified B group vitamins: Secondary | ICD-10-CM | POA: Diagnosis not present

## 2022-10-27 DIAGNOSIS — E538 Deficiency of other specified B group vitamins: Secondary | ICD-10-CM | POA: Diagnosis not present

## 2022-11-02 ENCOUNTER — Ambulatory Visit (HOSPITAL_BASED_OUTPATIENT_CLINIC_OR_DEPARTMENT_OTHER): Payer: Medicare HMO | Admitting: Family

## 2022-11-21 DIAGNOSIS — R062 Wheezing: Secondary | ICD-10-CM | POA: Diagnosis not present

## 2022-11-21 DIAGNOSIS — J9611 Chronic respiratory failure with hypoxia: Secondary | ICD-10-CM | POA: Diagnosis not present

## 2022-11-21 DIAGNOSIS — J449 Chronic obstructive pulmonary disease, unspecified: Secondary | ICD-10-CM | POA: Diagnosis not present

## 2022-11-21 DIAGNOSIS — G4733 Obstructive sleep apnea (adult) (pediatric): Secondary | ICD-10-CM | POA: Diagnosis not present

## 2022-11-21 DIAGNOSIS — J441 Chronic obstructive pulmonary disease with (acute) exacerbation: Secondary | ICD-10-CM | POA: Diagnosis not present

## 2022-11-22 ENCOUNTER — Encounter: Payer: Self-pay | Admitting: Hematology

## 2022-11-26 ENCOUNTER — Other Ambulatory Visit: Payer: Self-pay | Admitting: Rheumatology

## 2022-11-26 NOTE — Telephone Encounter (Signed)
Next Visit: 02/08/2023  Last Visit: 08/10/2022  Last Fill: 08/10/2022 (Cymbalta) 01/13/2022 (Tizanidine)  Dx: Fibromyalgia   Current Dose per office note on 08/10/2022: Cymbalta 30 mg p.o. daily, Tizanidine not discussed  Okay to refill Cymbalta and Tizanidine?

## 2022-11-29 ENCOUNTER — Encounter: Payer: Medicare Other | Admitting: Physical Medicine and Rehabilitation

## 2022-12-01 ENCOUNTER — Encounter: Payer: Self-pay | Admitting: Hematology

## 2022-12-02 ENCOUNTER — Encounter (HOSPITAL_BASED_OUTPATIENT_CLINIC_OR_DEPARTMENT_OTHER): Payer: Self-pay | Admitting: Family

## 2022-12-02 ENCOUNTER — Ambulatory Visit (HOSPITAL_BASED_OUTPATIENT_CLINIC_OR_DEPARTMENT_OTHER): Payer: Medicare Other | Admitting: Family

## 2022-12-02 VITALS — BP 130/80 | HR 104 | Ht 66.0 in | Wt 179.0 lb

## 2022-12-02 DIAGNOSIS — R000 Tachycardia, unspecified: Secondary | ICD-10-CM | POA: Diagnosis not present

## 2022-12-02 DIAGNOSIS — G4733 Obstructive sleep apnea (adult) (pediatric): Secondary | ICD-10-CM | POA: Diagnosis not present

## 2022-12-02 DIAGNOSIS — I10 Essential (primary) hypertension: Secondary | ICD-10-CM

## 2022-12-02 DIAGNOSIS — I5032 Chronic diastolic (congestive) heart failure: Secondary | ICD-10-CM | POA: Diagnosis not present

## 2022-12-02 DIAGNOSIS — I25118 Atherosclerotic heart disease of native coronary artery with other forms of angina pectoris: Secondary | ICD-10-CM

## 2022-12-02 DIAGNOSIS — E785 Hyperlipidemia, unspecified: Secondary | ICD-10-CM

## 2022-12-02 MED ORDER — METOPROLOL SUCCINATE ER 25 MG PO TB24: 25.0000 mg | ORAL_TABLET | Freq: Every day | ORAL | 3 refills | Status: DC

## 2022-12-02 MED ORDER — FUROSEMIDE 40 MG PO TABS: 40.0000 mg | ORAL_TABLET | Freq: Every day | ORAL | 3 refills | Status: DC

## 2022-12-02 NOTE — Patient Instructions (Addendum)
Medication Instructions:  Your physician has recommended you make the following change in your medication:   Start: metoprolol succinate '25mg'$  tablet daily   We have refilled your Lasix today!   *If you need a refill on your cardiac medications before your next appointment, please call your pharmacy*  Follow-Up: At [facility], you and your health needs are our priority.  As part of our continuing mission to provide you with exceptional heart care, we have created designated Provider Care Teams.  These Care Teams include your primary Cardiologist (physician) and Advanced Practice Providers (APPs -  Physician Assistants and Nurse Practitioners) who all work together to provide you with the care you need, when you need it.  We recommend signing up for the patient portal called "MyChart".  Sign up information is provided on this After Visit Summary.  MyChart is used to connect with patients for Virtual Visits (Telemedicine).  Patients are able to view lab/test results, encounter notes, upcoming appointments, etc.  Non-urgent messages can be sent to your provider as well.   To learn more about what you can do with MyChart, go to [email or web address].    Your next appointment:   6 month(s)  Provider:   Buford Dresser, MD or Laurann Montana, NP    Other Instructions Heart Healthy Diet Recommendations: A low-salt diet is recommended. Meats should be grilled, baked, or boiled. Avoid fried foods. Focus on lean protein sources like fish or chicken with vegetables and fruits. The American Heart Association is a Microbiologist!  American Heart Association Diet and Lifeystyle Recommendations   Exercise recommendations: The American Heart Association recommends 150 minutes of moderate intensity exercise weekly. Try 30 minutes of moderate intensity exercise 4-5 times per week. This could include walking, jogging, or swimming.

## 2022-12-02 NOTE — Progress Notes (Signed)
Office Visit    Patient Name: Gayatri Teasdale Date of Encounter: 12/02/2022  PCP:  Jonathon Jordan, [place]  Cardiologist:  Buford Dresser, MD  Advanced Practice Provider:  No care team member to display Electrophysiologist:  None      Chief Complaint    Teresa Roberts is a 62 y.o. female presents today for follow up follow up of diastolic heart failure.  Past Medical History    Past Medical History:  Diagnosis Date   CAD (coronary artery disease)    a. 01/2017: cath showing 20% mid RCA stenosis and otherwise normal LAD and LCx with a preserved EF of 55 to 60%; mod pulmonary HTN   COPD (chronic obstructive pulmonary disease) ([place])    O2 dependent   cpap    Depression    PTSD   Diastolic CHF ([place])    Fibromyalgia    Hyperlipidemia    Hypertension    Migraines    Past Surgical History:  Procedure Laterality Date   APPENDECTOMY     BIOPSY  01/01/2021   Procedure: BIOPSY;  Surgeon: Wilford Corner, MD;  [facility];  Service: Endoscopy;;   CESAREAN SECTION     COLONOSCOPY WITH PROPOFOL N/A 01/01/2021   Procedure: COLONOSCOPY WITH PROPOFOL;  Surgeon: Wilford Corner, MD;  [facility];  Service: Endoscopy;  Laterality: N/A;   ESOPHAGOGASTRODUODENOSCOPY (EGD) WITH PROPOFOL N/A 01/01/2021   Procedure: ESOPHAGOGASTRODUODENOSCOPY (EGD) WITH PROPOFOL;  Surgeon: Wilford Corner, MD;  [facility];  Service: Endoscopy;  Laterality: N/A;   ORIF ANKLE FRACTURE Left 08/12/2017   Procedure: OPEN REDUCTION INTERNAL FIXATION (ORIF) ANKLE FRACTURE;  Surgeon: Rod Can, MD;  Location: [place];  Service: Orthopedics;  Laterality: Left;   RIGHT HEART CATH N/A 07/19/2018   Procedure: RIGHT HEART CATH;  Surgeon: Jolaine Artist, MD;  Location: [place] [facility];  Service: Cardiovascular;  Laterality: N/A;   RIGHT/LEFT HEART CATH AND CORONARY ANGIOGRAPHY N/A 01/27/2017   Procedure: Right/Left Heart Cath and Coronary  Angiography;  Surgeon: Troy Sine, MD;  Location: [place] [facility];  Service: Cardiovascular;  Laterality: N/A;    Allergies  Allergies  Allergen Reactions   Citrus Hives   Lisinopril Cough    History of Present Illness    Jamyiah Labella is a 62 y.o. female with a hx of fibromyalgia, hypertension, COPD on home O2, chronic diastolic heart failure, tobacco use, nonobstructive CAD last seen 07/23/21.  Last seen 07/23/21 with atypical sharp lateral left under breast pain which was tender on palpation. No changes were made at that time.   Presents today for follow up independently. She says she feels dizzy right now with the tachycardia which started two weeks ago.  She stays well hydrated. Notes episodes of tachycardia occurring twice per week. She has lots of SOB all the time and this is unchanged. She still having  rare episodes of chest pain which lasts seconds and self resolves - it is not associated with exertion . Denies any swelling in the lower extremities and eats a low sodium diet. She says she has a dry cough. Monitors her blood pressure at home with systolic readings 300P-233A.   EKGs/Labs/Other Studies Reviewed:   The following studies were reviewed today:  Echo 03/26/20  1. Left ventricular ejection fraction, by estimation, is 55 to 60%. The  left ventricle has normal function. The left ventricle has no regional  wall motion abnormalities. Left ventricular diastolic parameters are  consistent  with Grade II diastolic  dysfunction (pseudonormalization).   2. Right ventricular systolic function is normal. The right ventricular  size is normal.   3. The mitral valve is grossly normal. Trivial mitral valve  regurgitation.   4. The aortic valve is tricuspid. Aortic valve regurgitation is not  visualized.   5. The inferior vena cava is normal in size with <50% respiratory  variability, suggesting right atrial pressure of 8 mmHg.   Comparison(s): Changes from prior study are  noted. 05/28/2018: LVEF 50-55%.   [place] 07/19/18 Findings:  RA = 13 RV = 52/14 PA = 50/19 (33) PCW = 16 Fick cardiac output/index = 4.7/2.3 PVR = 3.6 WU Ao sat = 97% PA sat = 64%, 64% SVC sat = 72%    Assessment:  1. Mild PAH with mild RV strain. Left-sided pressures and cardiac output look good 2. No evidence of intracardiac shunting   Plan/Discussion:  Would continue with conservative measures including CPAP and weight loss. If pressures worsening can consider selective pulmonary artery vasodilator down the road.    R/LHC 01/27/17 Mid RCA lesion, 20 %stenosed. The left ventricular ejection fraction is 55-65% by visual estimate. The left ventricular systolic function is normal. LV end diastolic pressure is normal.   Moderate right sided heart pressure elevation with moderate pulmonary hypertension.  Normal systolic function with moderate left ventricular hypertrophy with EF estimate of approximately 60%.  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   RECOMMENDATION: Medical therapy   Right Heart Pressures RA: A wave 15, V-wave 13, mean 12 RV: 55/14 PA: 55/19 PW: mean16  AO: 134/72 PA: 55/19; mean 33  LV: 131/18 PW: a 19; v 17; mean 16  LV: 114/17 AO: 114/66; mean 84  Oxygen saturation 92% in the aorta and 68% in the pulmonary artery.  Cardiac output by the thermodilution method 6.5 and by the Fick method 6.2 L/m. Cardiac index 3.6 and 3.3 L/m/m, respectively    Echo 05/28/18   - Left ventricle: The cavity size was normal. Wall thickness was   increased in a pattern of mild LVH. Systolic function was normal.   The estimated ejection fraction was in the range of 50% to 55%.   Wall motion was normal; there were no regional wall motion   abnormalities. Indeterminate diastolic function. - Aortic valve: Mildly calcified annulus. Trileaflet. - Mitral valve: Mildly calcified annulus. There was  trivial   regurgitation. - Left atrium: The atrium was at the upper limits of normal in   size. - Right ventricle: The cavity size was moderately dilated. Systolic   function was severely reduced. - Right atrium: The atrium was mildly dilated. Central venous   pressure (est): 3 mm Hg. - Tricuspid valve: There was mild regurgitation. - Pulmonary arteries: PA peak pressure: 25 mm Hg (S). - Pericardium, extracardiac: A small pericardial effusion was   identified posterior to the heart.  EKG:  EKG is ordered today.  The ekg ordered today demonstrates ST 104 bpm with no acute ST/T wave changes.   Recent Labs: No results found for requested labs within last 365 days.  Recent Lipid Panel    Component Value Date/Time   CHOL 144 05/28/2018 0629   TRIG 147 05/28/2018 0629   HDL 31 (L) 05/28/2018 0629   CHOLHDL 4.6 05/28/2018 0629   VLDL 29 05/28/2018 0629   LDLCALC 84 05/28/2018 0629    Home Medications   Current Meds  Medication  Sig   albuterol (PROVENTIL) (2.5 MG/3ML) 0.083% nebulizer solution Take 3 mLs (2.5 mg total) by nebulization every 6 (six) hours as needed for wheezing or shortness of breath.   albuterol (VENTOLIN HFA) 108 (90 Base) MCG/ACT inhaler Inhale 2 puffs into the lungs every 6 (six) hours as needed for wheezing or shortness of breath.   aspirin 81 MG EC tablet Take 81 mg by mouth daily after lunch.   busPIRone (BUSPAR) 10 MG tablet Take 10 mg by mouth 2 (two) times daily.   dorzolamide-timolol (COSOPT) 22.3-6.8 MG/ML ophthalmic solution Place 1 drop into both eyes 2 (two) times daily.   DULoxetine (CYMBALTA) 30 MG capsule TAKE 1 CAPSULE BY MOUTH EVERYDAY AT BEDTIME   esomeprazole (NEXIUM) 40 MG capsule    Fluticasone-Umeclidin-Vilant (TRELEGY ELLIPTA) 100-62.5-25 MCG/ACT AEPB Inhale 1 puff into the lungs daily.   folic acid (FOLVITE) 1 MG tablet Take 1 mg by mouth daily after lunch.   gabapentin (NEURONTIN) 800 MG tablet Take 800 mg by mouth 2 (two) times daily.    latanoprost (XALATAN) 0.005 % ophthalmic solution Place 1 drop into both eyes at bedtime.   metaxalone (SKELAXIN) 400 MG tablet Take 1 tablet (400 mg total) by mouth daily as needed.   Multiple Vitamins-Minerals (HAIR/SKIN/NAILS/BIOTIN) TABS Take 1 tablet by mouth daily after lunch.    nicotine (NICOTROL) 10 MG inhaler Inhale 1 Cartridge (1 continuous puffing total) into the lungs as needed for smoking cessation.   omeprazole (PRILOSEC) 20 MG capsule Take by mouth as needed.   OXYGEN Inhale 4 L into the lungs continuous.   rizatriptan (MAXALT-MLT) 10 MG disintegrating tablet Take 10 mg by mouth as needed for migraine. May repeat in 2 hours if needed   SAXENDA 18 MG/3ML SOPN    simvastatin (ZOCOR) 10 MG tablet Take 10 mg by mouth at bedtime.   tiZANidine (ZANAFLEX) 4 MG tablet TAKE 1 TABLET BY MOUTH AT BEDTIME AS NEEDED FOR MUSCLE SPASMS.   topiramate (TOPAMAX) 25 MG tablet Take 50 mg by mouth as needed.    traZODone (DESYREL) 100 MG tablet Take 100 mg by mouth at bedtime.   vitamin B-12 (CYANOCOBALAMIN) 1000 MCG tablet Take 1,000 mcg by mouth daily after lunch.     Review of Systems      All other systems reviewed and are otherwise negative except as noted above.  Physical Exam    VS:  BP 130/80   Pulse (!) 104   Ht '5\' 6"'$  (1.676 m)   Wt 179 lb (81.2 kg)   LMP 08/19/2012   BMI 28.89 kg/m  , BMI Body mass index is 28.89 kg/m.  Wt Readings from Last 3 Encounters:  12/02/22 179 lb (81.2 kg)  09/13/22 172 lb (78 kg)  08/11/22 181 lb 6.4 oz (82.3 kg)    GEN: Well nourished, well developed, in no acute distress. HEENT: normal. Neck: Supple, no JVD, carotid bruits, or masses. Cardiac: RRR, no murmurs, rubs, or gallops. No clubbing, cyanosis, edema.  Radials/PT 2+ and equal bilaterally.  Respiratory:  Respirations regular and unlabored, clear to auscultation bilaterally. GI: Soft, nontender, nondistended. MS: No deformity or atrophy. Skin: Warm and dry, no rash. Neuro:  Strength and  sensation are intact. Psych: Normal affect.  Assessment & Plan    History of syncope - No recurrence. No indication for further investigation.   Chronic diastolic heart failure / Mild pulmonary hypertension in the setting of COPD and chronic hypoxic respiratory failure - COPD followed by pulmonary. She has been  out of Furosemide x 2 months which we will resume. Grossly euvolemic on exam.   Tachycardia - EKG today ST 104 bpm. Notes palpitations twice per week which are bothersome. Start Toprol '25mg'$ . Anticipate her tachycardia is related to COPD. If persistent we discussed possible ZIO monitor.   OSA on CPAP - CPAP compliance encouraged.   HTN - BP well controlled. Continue current antihypertensive regimen.    Nonobstructive coronary disease / HLD - Nonobstructive by cath 2018. Stable with no anginal symptoms. No indication for ischemic evaluation.  GDMT aspirin, simvastatin. Heart healthy diet and regular cardiovascular exercise encouraged.          Disposition: Follow up in 6 month(s) with Buford Dresser, MD or APP.  Signed, Loel Dubonnet, NP 12/02/2022, 4:05 PM [place]

## 2022-12-03 NOTE — Addendum Note (Signed)
Addended by: Gerald Stabs on: 12/03/2022 08:05 AM   Modules accepted: Orders

## 2022-12-06 DIAGNOSIS — J441 Chronic obstructive pulmonary disease with (acute) exacerbation: Secondary | ICD-10-CM | POA: Diagnosis not present

## 2022-12-06 DIAGNOSIS — J449 Chronic obstructive pulmonary disease, unspecified: Secondary | ICD-10-CM | POA: Diagnosis not present

## 2022-12-06 DIAGNOSIS — J9611 Chronic respiratory failure with hypoxia: Secondary | ICD-10-CM | POA: Diagnosis not present

## 2022-12-06 DIAGNOSIS — R062 Wheezing: Secondary | ICD-10-CM | POA: Diagnosis not present

## 2022-12-08 ENCOUNTER — Encounter
Payer: Medicare Other | Attending: Physical Medicine and Rehabilitation | Admitting: Physical Medicine and Rehabilitation

## 2022-12-08 VITALS — BP 107/67 | HR 77 | Ht 66.0 in | Wt 172.0 lb

## 2022-12-08 DIAGNOSIS — M5386 Other specified dorsopathies, lumbar region: Secondary | ICD-10-CM

## 2022-12-08 DIAGNOSIS — M797 Fibromyalgia: Secondary | ICD-10-CM | POA: Insufficient documentation

## 2022-12-08 DIAGNOSIS — F321 Major depressive disorder, single episode, moderate: Secondary | ICD-10-CM

## 2022-12-08 DIAGNOSIS — G894 Chronic pain syndrome: Secondary | ICD-10-CM | POA: Diagnosis not present

## 2022-12-08 MED ORDER — PREGABALIN 75 MG PO CAPS: ORAL_CAPSULE | ORAL | 5 refills | Status: DC

## 2022-12-08 MED ORDER — DULOXETINE HCL 30 MG PO CPEP: 30.0000 mg | ORAL_CAPSULE | Freq: Two times a day (BID) | ORAL | 5 refills | Status: DC

## 2022-12-08 NOTE — Patient Instructions (Signed)
I am cross-tapering you from Gabapentin to Lyrica. You will half your regular doses of gabapentin and take 1 tablet of Lyrica twice daily for 1 week; then you will increase to Lyrica 2 tablets twice daily and stop gabapentin. Call me in 1-2 weeks to report effects.   I am increasing your Duloxetine from 30 mg once daily to 30 mg twice daily for depression. I am also going to talk to your PCP about a therapy referral.   I have placed an order for aquatherapy to get you moving.  Please request records from your recent MRI for review.   Schedule an injection with Dr. Nelva Bush as soon as possible. I will ask about this at out call in 2 weeks.   Follow up with me in 3 months. We may perform trigger point injections that visit.

## 2022-12-08 NOTE — Progress Notes (Incomplete)
Subjective:    Patient ID: Teresa Roberts, female    DOB: 12-21-1960, 62 y.o.   MRN: [id number]  HPI  Teresa Roberts is a 62 y.o. year old female  who  has a past medical history of CAD (coronary artery disease), COPD (chronic obstructive pulmonary disease) ([place]), cpap, Depression, Diastolic CHF ([place]), Fibromyalgia, Hyperlipidemia, Hypertension, and Migraines.   They are presenting to PM&R clinic for follow up of multimodal chronic pain ; likely L L4 -L5 radiculopathy and fibromyalgia, with possible facet arthropathy.     Plan from last visit:   Chronic pain syndrome Assessment & Plan: No current opiate script, has intermittently tolerated low dose opiates well. Will try ESI and transition to Lyrica as below. If no benefit or additional modalities needed at follow up, can consider initiating low-risk option such as tramadol or buprenorphine. Alternatively, could consider low-dose naltrexone trial given respiratory dz.    Orders: -     Chiropodist   Encounter for therapeutic drug monitoring -     ToxAssure Select,+Antidepr,UR   Fibromyalgia Assessment & Plan: Currently taking gabapentin 1600 BID (prescribed 800 mg BID) with some benefit. BUN and Cr WNL 9/23 per records.    Once urine drug screen has resulted, if it is as expected, we will plan to transition from gabapentin to Lyrica 150 mg BID.   Patient already on Cymbalta 30 mg daily.     Sciatica of left side associated with disorder of lumbar spine Assessment & Plan: No recent imaging; will obtain Lumbar xrays 2-3 views to evaluate for arthritis, disc disease, and spondylosis.    Given excellent prior benefits, urged patient to call [facility] and schedule yearly epidural steroid injection. If unable to get in, could also internally refer to Dr. Letta Pate.    We will follow up in one month after imaging and medication changes (see below).   Orders: -     DG Lumbar Spine 2-3 Views; Future -     ToxAssure  Select,+Antidepr,UR    Interval Hx: - Since last visit, was diagnosed with osteoporosis by her PCP  - Had an MRI recently of her back with cone mobile unit at the same time as her mammogram recently; not sure of results. Never did lumbar xrays.   - Has not had further injections with Dr. Nelva Bush; last round worked for about 6 months. Unsure why she has not called.   - She gets TPI with rheumatology which helps with tension; has been >6 months, has an appointment coming up.  - She is still on 1600 Bid of gabapentin; did not try switching over to Lyrica. Unsure if these are working.  - Endorses pain causing terrible fatigue; can barely get out of bed, tablets just make her comfortable in bed. Her husband cooks and she is about to hire someone to clean.   - Goal is just to walk around and "be able to do stuff". She states depression is "getting BAD", states home is stressful. Takes Buspar for anxiety with benefit. Does not have a therapist but wants one. No SI/HI.   Pain Inventory Average Pain 8 Pain Right Now 8 My pain is burning and aching  In the last 24 hours, has pain interfered with the following? General activity 8 Relation with others 9 Enjoyment of life 9 What TIME of day is your pain at its worst? morning , daytime, and night Sleep (in general) Fair  Pain is worse with: walking, bending, sitting, inactivity, standing, and some activites Pain  improves with: rest and injections Relief from Meds:  na  Family History  Problem Relation Age of Onset  . Diabetes Mother   . Hypertension Mother   . CAD Mother   . Diabetes Father   . Lung cancer Father   . Diabetes Sister   . Hypercalcemia Neg Hx   . Breast cancer Neg Hx    Social History   Socioeconomic History  . Marital status: Married    Spouse name: Not on file  . Number of children: Not on file  . Years of education: Not on file  . Highest education level: Not on file  Occupational History  . Not on file   Tobacco Use  . Smoking status: Former    Packs/day: 1.00    Years: 39.00    Total pack years: 39.00    Types: Cigarettes    Start date: 07/21/2018  . Smokeless tobacco: Never  Vaping Use  . Vaping Use: Never used  Substance and Sexual Activity  . Alcohol use: No  . Drug use: Not Currently    Types: Marijuana  . Sexual activity: Not on file  Other Topics Concern  . Not on file  Social History Narrative  . Not on file   Social Determinants of Health   Financial Resource Strain: Not on file  Food Insecurity: Not on file  Transportation Needs: Not on file  Physical Activity: Not on file  Stress: Not on file  Social Connections: Not on file   Past Surgical History:  Procedure Laterality Date  . APPENDECTOMY    . BIOPSY  01/01/2021   Procedure: BIOPSY;  Surgeon: Wilford Corner, MD;  [facility];  Service: Endoscopy;;  . CESAREAN SECTION    . COLONOSCOPY WITH PROPOFOL N/A 01/01/2021   Procedure: COLONOSCOPY WITH PROPOFOL;  Surgeon: Wilford Corner, MD;  [facility];  Service: Endoscopy;  Laterality: N/A;  . ESOPHAGOGASTRODUODENOSCOPY (EGD) WITH PROPOFOL N/A 01/01/2021   Procedure: ESOPHAGOGASTRODUODENOSCOPY (EGD) WITH PROPOFOL;  Surgeon: Wilford Corner, MD;  [facility];  Service: Endoscopy;  Laterality: N/A;  . ORIF ANKLE FRACTURE Left 08/12/2017   Procedure: OPEN REDUCTION INTERNAL FIXATION (ORIF) ANKLE FRACTURE;  Surgeon: Rod Can, MD;  Location: [place];  Service: Orthopedics;  Laterality: Left;  . RIGHT HEART CATH N/A 07/19/2018   Procedure: RIGHT HEART CATH;  Surgeon: Jolaine Artist, MD;  Location: [place] [facility];  Service: Cardiovascular;  Laterality: N/A;  . RIGHT/LEFT HEART CATH AND CORONARY ANGIOGRAPHY N/A 01/27/2017   Procedure: Right/Left Heart Cath and Coronary Angiography;  Surgeon: Troy Sine, MD;  Location: [place] [facility];  Service: Cardiovascular;  Laterality: N/A;   Past Surgical History:  Procedure  Laterality Date  . APPENDECTOMY    . BIOPSY  01/01/2021   Procedure: BIOPSY;  Surgeon: Wilford Corner, MD;  [facility];  Service: Endoscopy;;  . CESAREAN SECTION    . COLONOSCOPY WITH PROPOFOL N/A 01/01/2021   Procedure: COLONOSCOPY WITH PROPOFOL;  Surgeon: Wilford Corner, MD;  [facility];  Service: Endoscopy;  Laterality: N/A;  . ESOPHAGOGASTRODUODENOSCOPY (EGD) WITH PROPOFOL N/A 01/01/2021   Procedure: ESOPHAGOGASTRODUODENOSCOPY (EGD) WITH PROPOFOL;  Surgeon: Wilford Corner, MD;  [facility];  Service: Endoscopy;  Laterality: N/A;  . ORIF ANKLE FRACTURE Left 08/12/2017   Procedure: OPEN REDUCTION INTERNAL FIXATION (ORIF) ANKLE FRACTURE;  Surgeon: Rod Can, MD;  Location: [place];  Service: Orthopedics;  Laterality: Left;  . RIGHT HEART CATH N/A 07/19/2018   Procedure: RIGHT HEART CATH;  Surgeon: Jolaine Artist, MD;  Location: [place] [facility];  Service: Cardiovascular;  Laterality: N/A;  . RIGHT/LEFT HEART CATH AND CORONARY ANGIOGRAPHY N/A 01/27/2017   Procedure: Right/Left Heart Cath and Coronary Angiography;  Surgeon: Troy Sine, MD;  Location: [place] [facility];  Service: Cardiovascular;  Laterality: N/A;   Past Medical History:  Diagnosis Date  . CAD (coronary artery disease)    a. 01/2017: cath showing 20% mid RCA stenosis and otherwise normal LAD and LCx with a preserved EF of 55 to 60%; mod pulmonary HTN  . COPD (chronic obstructive pulmonary disease) ([place])    O2 dependent  . cpap   . Depression    PTSD  . Diastolic CHF ([place])   . Fibromyalgia   . Hyperlipidemia   . Hypertension   . Migraines    BP 107/67   Pulse 77   Ht '5\' 6"'$  (1.676 m)   Wt 172 lb (78 kg)   LMP 08/19/2012   SpO2 94%   BMI 27.76 kg/m   Opioid Risk Score:   Fall Risk Score:  `1  Depression screen [facility] 2/9     09/13/2022    1:11 PM 07/28/2018   10:17 AM  Depression screen PHQ 2/9  Decreased Interest 2 3  Down, Depressed, Hopeless 1 2  PHQ -  2 Score 3 5  Altered sleeping 0 0  Tired, decreased energy 3 3  Change in appetite 1 3  Feeling bad or failure about yourself  0 2  Trouble concentrating 1 2  Moving slowly or fidgety/restless 1 3  Suicidal thoughts 0 0  PHQ-9 Score 9 18  Difficult doing work/chores Very difficult Very difficult     Review of Systems  Musculoskeletal:  Positive for back pain and neck pain.       Left leg pain  All other systems reviewed and are negative.     Objective:   Physical Exam  Constitution: Appropriate appearance for age. +Fatigue.  HEENT: PERRL, EOMI grossly intact.  Resp: Mild bilateral wheezing. +nasal canula.  Cardio: RRR. No mumurs, rubs, or gallops. No peripheral edema. Psych: Dysphoric mood, flat affect. Neuro: AAOx4. No apparent deficits    Neurologic Exam:   Sensory exam: revealed normal sensation in all dermatomal regions in bilateral lower extremities.  Motor exam: strength normal in all myotomal regions of bilateral upper and lower extremities.   + ttp at the thoracic and lumbar spinous processes and paraspinals, PSIS,  SI joints. There was no evidence of spasm. No trigger points were noted.    + Mild generalized back pain with facet loading  Gait: Antalgic, slow, no apparent unilateral weakness. No trendelenburg.       Assessment & Plan:   Teresa Roberts is a 62 y.o. year old female  who  has a past medical history of CAD (coronary artery disease), COPD (chronic obstructive pulmonary disease) ([place]), cpap, Depression, Diastolic CHF ([place]), Fibromyalgia, Hyperlipidemia, Hypertension, and Migraines.   They are presenting to PM&R clinic for follow up of multimodal chronic pain; likely L L4 -L5 radiculopathy,  fibromyalgia, and facet arthropathy.

## 2022-12-08 NOTE — Progress Notes (Unsigned)
Subjective:    Patient ID: Teresa Roberts, female    DOB: 08/28/1961, 62 y.o.   MRN: [id number]  HPI  Teresa Roberts is a 62 y.o. year old female  who  has a past medical history of CAD (coronary artery disease), COPD (chronic obstructive pulmonary disease) ([place]), cpap, Depression, Diastolic CHF ([place]), Fibromyalgia, Hyperlipidemia, Hypertension, and Migraines.   They are presenting to PM&R clinic for follow up of multimodal chronic pain ; likely L L4 -L5 radiculopathy and fibromyalgia, with possible facet arthropathy.     Plan from last visit:   Chronic pain syndrome Assessment & Plan: No current opiate script, has intermittently tolerated low dose opiates well. Will try ESI and transition to Lyrica as below. If no benefit or additional modalities needed at follow up, can consider initiating low-risk option such as tramadol or buprenorphine. Alternatively, could consider low-dose naltrexone trial given respiratory dz.    Orders: -     Chiropodist   Encounter for therapeutic drug monitoring -     ToxAssure Select,+Antidepr,UR   Fibromyalgia Assessment & Plan: Currently taking gabapentin 1600 BID (prescribed 800 mg BID) with some benefit. BUN and Cr WNL 9/23 per records.    Once urine drug screen has resulted, if it is as expected, we will plan to transition from gabapentin to Lyrica 150 mg BID.   Patient already on Cymbalta 30 mg daily.     Sciatica of left side associated with disorder of lumbar spine Assessment & Plan: No recent imaging; will obtain Lumbar xrays 2-3 views to evaluate for arthritis, disc disease, and spondylosis.    Given excellent prior benefits, urged patient to call [facility] and schedule yearly epidural steroid injection. If unable to get in, could also internally refer to Dr. Letta Pate.    We will follow up in one month after imaging and medication changes (see below).   Orders: -     DG Lumbar Spine 2-3 Views; Future -     ToxAssure  Select,+Antidepr,UR    Interval Hx: - Since last visit, was diagnosed with osteoporosis by her PCP  - Had an MRI recently of her back with cone mobile unit at the same time as her mammogram recently; not sure of results. Never did lumbar xrays.   - Has not had further injections with Dr. Nelva Bush; last round worked for about 6 months. Unsure why she has not called.   - She gets TPI with rheumatology which helps with tension; has been >6 months, has an appointment coming up.  - She is still on 1600 Bid of gabapentin; did not try switching over to Lyrica. Unsure if these are working.  - Endorses pain causing terrible fatigue; can barely get out of bed, tablets just make her comfortable in bed. Her husband cooks and she is about to hire someone to clean.   - Goal is just to walk around and "be able to do stuff". She states depression is "getting BAD", states home is stressful. Takes Buspar for anxiety with benefit. Does not have a therapist but wants one. No SI/HI.   Pain Inventory Average Pain 8 Pain Right Now 8 My pain is burning and aching  In the last 24 hours, has pain interfered with the following? General activity 8 Relation with others 9 Enjoyment of life 9 What TIME of day is your pain at its worst? morning , daytime, and night Sleep (in general) Fair  Pain is worse with: walking, bending, sitting, inactivity, standing, and some activites Pain  improves with: rest and injections Relief from Meds:  na  Family History  Problem Relation Age of Onset   Diabetes Mother    Hypertension Mother    CAD Mother    Diabetes Father    Lung cancer Father    Diabetes Sister    Hypercalcemia Neg Hx    Breast cancer Neg Hx    Social History   Socioeconomic History   Marital status: Married    Spouse name: Not on file   Number of children: Not on file   Years of education: Not on file   Highest education level: Not on file  Occupational History   Not on file  Tobacco Use    Smoking status: Former    Packs/day: 1.00    Years: 39.00    Total pack years: 39.00    Types: Cigarettes    Start date: 07/21/2018   Smokeless tobacco: Never  Vaping Use   Vaping Use: Never used  Substance and Sexual Activity   Alcohol use: No   Drug use: Not Currently    Types: Marijuana   Sexual activity: Not on file  Other Topics Concern   Not on file  Social History Narrative   Not on file   Social Determinants of Health   Financial Resource Strain: Not on file  Food Insecurity: Not on file  Transportation Needs: Not on file  Physical Activity: Not on file  Stress: Not on file  Social Connections: Not on file   Past Surgical History:  Procedure Laterality Date   APPENDECTOMY     BIOPSY  01/01/2021   Procedure: BIOPSY;  Surgeon: Wilford Corner, MD;  Location: [facility];  Service: Endoscopy;;   CESAREAN SECTION     COLONOSCOPY WITH PROPOFOL N/A 01/01/2021   Procedure: COLONOSCOPY WITH PROPOFOL;  Surgeon: Wilford Corner, MD;  Location: [facility];  Service: Endoscopy;  Laterality: N/A;   ESOPHAGOGASTRODUODENOSCOPY (EGD) WITH PROPOFOL N/A 01/01/2021   Procedure: ESOPHAGOGASTRODUODENOSCOPY (EGD) WITH PROPOFOL;  Surgeon: Wilford Corner, MD;  Location: [facility];  Service: Endoscopy;  Laterality: N/A;   ORIF ANKLE FRACTURE Left 08/12/2017   Procedure: OPEN REDUCTION INTERNAL FIXATION (ORIF) ANKLE FRACTURE;  Surgeon: Rod Can, MD;  Location: [place];  Service: Orthopedics;  Laterality: Left;   RIGHT HEART CATH N/A 07/19/2018   Procedure: RIGHT HEART CATH;  Surgeon: Jolaine Artist, MD;  Location: [facility];  Service: Cardiovascular;  Laterality: N/A;   RIGHT/LEFT HEART CATH AND CORONARY ANGIOGRAPHY N/A 01/27/2017   Procedure: Right/Left Heart Cath and Coronary Angiography;  Surgeon: Troy Sine, MD;  Location: [place] [facility];  Service: Cardiovascular;  Laterality: N/A;   Past Surgical History:  Procedure Laterality Date   APPENDECTOMY      BIOPSY  01/01/2021   Procedure: BIOPSY;  Surgeon: Wilford Corner, MD;  [facility]: [facility];  Service: Endoscopy;;   CESAREAN SECTION     COLONOSCOPY WITH PROPOFOL N/A 01/01/2021   Procedure: COLONOSCOPY WITH PROPOFOL;  Surgeon: Wilford Corner, MD;  [facility];  Service: Endoscopy;  Laterality: N/A;   ESOPHAGOGASTRODUODENOSCOPY (EGD) WITH PROPOFOL N/A 01/01/2021   Procedure: ESOPHAGOGASTRODUODENOSCOPY (EGD) WITH PROPOFOL;  Surgeon: Wilford Corner, MD;  [facility];  Service: Endoscopy;  Laterality: N/A;   ORIF ANKLE FRACTURE Left 08/12/2017   Procedure: OPEN REDUCTION INTERNAL FIXATION (ORIF) ANKLE FRACTURE;  Surgeon: Rod Can, MD;  Location: [place];  Service: Orthopedics;  Laterality: Left;   RIGHT HEART CATH N/A 07/19/2018   Procedure: RIGHT HEART CATH;  Surgeon: Jolaine Artist, MD;  Location: [facility];  Service: Cardiovascular;  Laterality: N/A;   RIGHT/LEFT HEART CATH AND CORONARY ANGIOGRAPHY N/A 01/27/2017   Procedure: Right/Left Heart Cath and Coronary Angiography;  Surgeon: Troy Sine, MD;  Location: [place] [facility];  Service: Cardiovascular;  Laterality: N/A;   Past Medical History:  Diagnosis Date   CAD (coronary artery disease)    a. 01/2017: cath showing 20% mid RCA stenosis and otherwise normal LAD and LCx with a preserved EF of 55 to 60%; mod pulmonary HTN   COPD (chronic obstructive pulmonary disease) (H[facility])    O2 dependent   cpap    Depression    PTSD   Diastolic CHF (H[facility])    Fibromyalgia    Hyperlipidemia    Hypertension    Migraines    BP 107/67   Pulse 77   Ht '5\' 6"'$  (1.676 m)   Wt 172 lb (78 kg)   LMP 08/19/2012   SpO2 94%   BMI 27.76 kg/m   Opioid Risk Score:   Fall Risk Score:  `1  Depression screen [facility] 2/9     09/13/2022    1:11 PM 07/28/2018   10:17 AM  Depression screen PHQ 2/9  Decreased Interest 2 3  Down, Depressed, Hopeless 1 2  PHQ - 2 Score 3 5  Altered sleeping 0 0  Tired, decreased  energy 3 3  Change in appetite 1 3  Feeling bad or failure about yourself  0 2  Trouble concentrating 1 2  Moving slowly or fidgety/restless 1 3  Suicidal thoughts 0 0  PHQ-9 Score 9 18  Difficult doing work/chores Very difficult Very difficult     Review of Systems  Musculoskeletal:  Positive for back pain and neck pain.       Left leg pain  All other systems reviewed and are negative.     Objective:   Physical Exam  Constitution: Appropriate appearance for age. +Fatigue.  HEENT: PERRL, EOMI grossly intact.  Resp: Mild bilateral wheezing. +nasal canula.  Cardio: RRR. No mumurs, rubs, or gallops. No peripheral edema. Psych: Dysphoric mood, flat affect. Neuro: AAOx4. No apparent deficits    Neurologic Exam:   Sensory exam: revealed normal sensation in all dermatomal regions in bilateral lower extremities.  Motor exam: strength normal in all myotomal regions of bilateral upper and lower extremities.   + ttp at the thoracic and lumbar spinous processes and paraspinals, PSIS,  SI joints. There was no evidence of spasm. No trigger points were noted.    + Mild generalized back pain with facet loading  Gait: Antalgic, slow, no apparent unilateral weakness. No trendelenburg.       Assessment & Plan:   Teresa Roberts is a 62 y.o. year old female  who  has a past medical history of CAD (coronary artery disease), COPD (chronic obstructive pulmonary disease) ([place]), cpap, Depression, Diastolic CHF ([place]), Fibromyalgia, Hyperlipidemia, Hypertension, and Migraines.   They are presenting to PM&R clinic for follow up of multimodal chronic pain; likely L L4 -L5 radiculopathy,  fibromyalgia, and facet arthropathy.     Chronic pain syndrome Assessment & Plan: Patient was noncompliant with all recommendations from last visit due to worsening generalized fatigue, pain, and depression. Had long discussion regarding the interplay of depression/anxiety with chronic pain syndromes such as fibromyalgia.    UDS last visit was appropriate, will continue non-narcotic management of chronic pain and will inform PCP of patient's desire to pursue therapy for  uncontrolled depression and chronic fatigue.   Follow up with me in 3 months. We may perform trigger point injections that visit.   Orders: -     Ambulatory referral to Physical Therapy  Current moderate episode of major depressive disorder, unspecified whether recurrent (HCC) Assessment & Plan: I am increasing your Duloxetine from 30 mg once daily to 30 mg twice daily for depression. I am also going to talk to your PCP about a therapy referral.    Fibromyalgia Assessment & Plan: I am cross-tapering you from Gabapentin to Lyrica. You will half your regular doses of gabapentin and take 1 tablet of Lyrica twice daily for 1 week; then you will increase to Lyrica 2 tablets twice daily and stop gabapentin. Call me in 1-2 weeks to report effects.   I have placed an order for aquatherapy to get you moving in a gentle environment that offloads your joints. I did note need to stay in shallow water near poolside due to need for oxygen.   Orders: -     Ambulatory referral to Physical Therapy  Sciatica of left side associated with disorder of lumbar spine Assessment & Plan: Please request records from your recent MRI for review.   Schedule an injection with Dr. Nelva Bush as soon as possible. I will ask about this at out call in 2 weeks.    Orders: -     Ambulatory referral to Physical Therapy  Other orders -     DULoxetine HCl; Take 1 capsule (30 mg total) by mouth 2 (two) times daily.  Dispense: 180 capsule; Refill: 5 -     Pregabalin; Start with Lyrica 75 mg 1 tablet twice daily while cutting your doses of gabapentin in half. Continue this for 1 week, then start Lyrica 2 tablets twice per day, and stop gabapentin. If any side effects or concerns, stop lyrica and return to gabapentin at prior dose  Dispense: 120 capsule; Refill: [place], DO 12/09/2022

## 2022-12-09 NOTE — Assessment & Plan Note (Addendum)
I am cross-tapering you from Gabapentin to Lyrica. You will half your regular doses of gabapentin and take 1 tablet of Lyrica twice daily for 1 week; then you will increase to Lyrica 2 tablets twice daily and stop gabapentin. Call me in 1-2 weeks to report effects.   I have placed an order for aquatherapy to get you moving in a gentle environment that offloads your joints. I did note need to stay in shallow water near poolside due to need for oxygen.

## 2022-12-09 NOTE — Assessment & Plan Note (Addendum)
Patient was noncompliant with all recommendations from last visit due to worsening generalized fatigue, pain, and depression. Had long discussion regarding the interplay of depression/anxiety with chronic pain syndromes such as fibromyalgia.   UDS last visit was appropriate, will continue non-narcotic management of chronic pain and will inform PCP of patient's desire to pursue therapy for uncontrolled depression and chronic fatigue.   Follow up with me in 3 months. We may perform trigger point injections that visit.

## 2022-12-09 NOTE — Assessment & Plan Note (Signed)
Please request records from your recent MRI for review.   Schedule an injection with Dr. Nelva Bush as soon as possible. I will ask about this at out call in 2 weeks.

## 2022-12-09 NOTE — Assessment & Plan Note (Signed)
I am increasing your Duloxetine from 30 mg once daily to 30 mg twice daily for depression. I am also going to talk to your PCP about a therapy referral.

## 2022-12-13 ENCOUNTER — Encounter: Payer: Self-pay | Admitting: Hematology

## 2022-12-13 ENCOUNTER — Telehealth: Payer: Self-pay | Admitting: *Deleted

## 2022-12-13 ENCOUNTER — Encounter: Payer: Self-pay | Admitting: Adult Health

## 2022-12-13 ENCOUNTER — Ambulatory Visit: Payer: Medicare Other | Admitting: Adult Health

## 2022-12-13 VITALS — BP 90/64 | HR 69 | Temp 97.8°F | Ht 66.0 in | Wt 172.8 lb

## 2022-12-13 DIAGNOSIS — J4489 Other specified chronic obstructive pulmonary disease: Secondary | ICD-10-CM

## 2022-12-13 DIAGNOSIS — I272 Pulmonary hypertension, unspecified: Secondary | ICD-10-CM | POA: Diagnosis not present

## 2022-12-13 DIAGNOSIS — G4733 Obstructive sleep apnea (adult) (pediatric): Secondary | ICD-10-CM

## 2022-12-13 DIAGNOSIS — J439 Emphysema, unspecified: Secondary | ICD-10-CM | POA: Diagnosis not present

## 2022-12-13 MED ORDER — ALBUTEROL SULFATE (2.5 MG/3ML) 0.083% IN NEBU: 2.5000 mg | INHALATION_SOLUTION | Freq: Four times a day (QID) | RESPIRATORY_TRACT | 5 refills | Status: DC | PRN

## 2022-12-13 MED ORDER — ALBUTEROL SULFATE HFA 108 (90 BASE) MCG/ACT IN AERS: 2.0000 | INHALATION_SPRAY | Freq: Four times a day (QID) | RESPIRATORY_TRACT | 5 refills | Status: AC | PRN

## 2022-12-13 NOTE — Progress Notes (Signed)
$'@Patient'A$  ID: Teresa Roberts, female    DOB: 1961/08/19, 62 y.o.   MRN: [id number]  Chief Complaint  Patient presents with   Follow-up    Referring provider: Jonathon Jordan, MD  HPI: 62 year old female former smoking history-heavy smoker, followed for severe COPD with emphysema, pulmonary hypertension, chronic oxygen dependent respiratory failure, sleep apnea-CPAP intolerant  TEST/EVENTS :  08/2021 LDCt mild emphysema, RADS-1 CT angiogram 01/2017  neg for pulmonary embolism .  ABG showed mild hypercarbia with 7.36/45/62 and 7.33/51/70 .    Echo 03/2020 nml LVEF, gr2 DD , RVSF nml Cath 07/2018  PA 50/19, PVR 3.6 WU, RA13   PFTs 07/2021 showed drop in FEV1 to 0.97/43%, no bronchodilator response, TLC maintained, DLCO 31%/6.53  Spirometry 02/2017  ratio of 69, FEV1 of 1.15-52% and FVC of 60%   NPSG ( [facility])  per report - AHI of 16/hour >> auto CPAP 12-20 cm>> avg 16 cm  12/13/2022 Follow up ; COPD , O2 RF , PAH  Patient returns for 38-monthfollow-up.  Patient has underlying severe COPD with emphysema.  She remains on Trelegy inhaler daily.  She says overall breathing is doing okay.  She has had no flare of cough or wheezing.  No recent antibiotic or steroid use.  Patient has a heavy smoking history.  She previously participated in the cancer screening program.  She is overdue for her screening CT.  Remains on oxygen 6 L.  Says she has had no increased oxygen demand.  Patient does have a portable oxygen concentrator to use when she away from home.  She says it has not been working properly and needs a new one.  Patient denies any hemoptysis, chest pain, orthopnea.  Denies any decreased activity tolerance.  Does get winded with heavy activities.  No flare of her cough.   Allergies  Allergen Reactions   Citrus Hives   Lisinopril Cough    Immunization History  Administered Date(s) Administered   Influenza Split 08/15/2017, 08/28/2018, 08/24/2019   Influenza,inj,Quad PF,6+ Mos 12/28/2016,  09/28/2017, 09/04/2018, 08/03/2019, 08/29/2020, 08/03/2021, 08/11/2022   Influenza,inj,quad, With Preservative 08/09/2014   Influenza-Unspecified 08/15/2016   Moderna Sars-Covid-2 Vaccination 02/11/2020, 03/10/2020, 07/11/2020   Pneumococcal Polysaccharide-23 02/04/2014, 08/31/2016   Pneumococcal-Unspecified 02/13/2013   Respiratory Syncytial Virus Vaccine,Recomb Aduvanted(Arexvy) 08/11/2022   Tdap 04/25/2013, 02/04/2014   Zoster, Live 05/31/2019, 08/03/2019    Past Medical History:  Diagnosis Date   CAD (coronary artery disease)    a. 01/2017: cath showing 20% mid RCA stenosis and otherwise normal LAD and LCx with a preserved EF of 55 to 60%; mod pulmonary HTN   COPD (chronic obstructive pulmonary disease) (HCC)    O2 dependent   cpap    Depression    PTSD   Diastolic CHF ([place]    Fibromyalgia    Hyperlipidemia    Hypertension    Migraines     Tobacco History: Social History   Tobacco Use  Smoking Status Former   Packs/day: 1.00   Years: 39.00   Total pack years: 39.00   Types: Cigarettes   Start date: 07/21/2018   Quit date: 08/13/2022   Years since quitting: 0.3  Smokeless Tobacco Never   Counseling given: Not Answered   Outpatient Medications Prior to Visit  Medication Sig Dispense Refill   albuterol (PROVENTIL) (2.5 MG/3ML) 0.083% nebulizer solution Take 3 mLs (2.5 mg total) by nebulization every 6 (six) hours as needed for wheezing or shortness of breath. 360 mL 5   albuterol (VENTOLIN HFA) 108 (90  Base) MCG/ACT inhaler Inhale 2 puffs into the lungs every 6 (six) hours as needed for wheezing or shortness of breath. 18 g 5   aspirin 81 MG EC tablet Take 81 mg by mouth daily after lunch.  0   busPIRone (BUSPAR) 10 MG tablet Take 10 mg by mouth 2 (two) times daily.     dorzolamide-timolol (COSOPT) 22.3-6.8 MG/ML ophthalmic solution Place 1 drop into both eyes 2 (two) times daily.     DULoxetine (CYMBALTA) 30 MG capsule Take 1 capsule (30 mg total) by mouth 2 (two)  times daily. 180 capsule 5   esomeprazole (NEXIUM) 40 MG capsule Take 40 mg by mouth as needed.     Fluticasone-Umeclidin-Vilant (TRELEGY ELLIPTA) 100-62.5-25 MCG/ACT AEPB Inhale 1 puff into the lungs daily. 60 each 5   furosemide (LASIX) 40 MG tablet Take 1 tablet (40 mg total) by mouth daily. 90 tablet 3   latanoprost (XALATAN) 0.005 % ophthalmic solution Place 1 drop into both eyes at bedtime.     metoprolol succinate (TOPROL XL) 25 MG 24 hr tablet Take 1 tablet (25 mg total) by mouth daily. 90 tablet 3   omeprazole (PRILOSEC) 20 MG capsule Take 20 mg by mouth as needed.  4   OXYGEN Inhale 4 L into the lungs continuous.     pregabalin (LYRICA) 75 MG capsule Start with Lyrica 75 mg 1 tablet twice daily while cutting your doses of gabapentin in half. Continue this for 1 week, then start Lyrica 2 tablets twice per day, and stop gabapentin. If any side effects or concerns, stop lyrica and return to gabapentin at prior dose 120 capsule 5   rizatriptan (MAXALT-MLT) 10 MG disintegrating tablet Take 10 mg by mouth as needed for migraine. May repeat in 2 hours if needed     SAXENDA 18 MG/3ML SOPN      simvastatin (ZOCOR) 10 MG tablet Take 10 mg by mouth at bedtime.     tiZANidine (ZANAFLEX) 4 MG tablet TAKE 1 TABLET BY MOUTH AT BEDTIME AS NEEDED FOR MUSCLE SPASMS. 30 tablet 2   topiramate (TOPAMAX) 25 MG tablet Take 50 mg by mouth as needed.      traZODone (DESYREL) 100 MG tablet Take 100 mg by mouth at bedtime.     vitamin B-12 (CYANOCOBALAMIN) 1000 MCG tablet Take 1,000 mcg by mouth daily after lunch.     folic acid (FOLVITE) 1 MG tablet Take 1 mg by mouth daily after lunch. (Patient not taking: Reported on 12/13/2022)  0   metaxalone (SKELAXIN) 400 MG tablet Take 1 tablet (400 mg total) by mouth daily as needed. (Patient not taking: Reported on 12/13/2022) 30 tablet 2   Multiple Vitamins-Minerals (HAIR/SKIN/NAILS/BIOTIN) TABS Take 1 tablet by mouth daily after lunch.  (Patient not taking: Reported on  12/13/2022)     nicotine (NICOTROL) 10 MG inhaler Inhale 1 Cartridge (1 continuous puffing total) into the lungs as needed for smoking cessation. (Patient not taking: Reported on 12/13/2022) 168 each 4   No facility-administered medications prior to visit.     Review of Systems:   Constitutional:   No  weight loss, night sweats,  Fevers, chills, fatigue, or  lassitude.  HEENT:   No headaches,  Difficulty swallowing,  Tooth/dental problems, or  Sore throat,                No sneezing, itching, ear ache, nasal congestion, post nasal drip,   CV:  No chest pain,  Orthopnea, PND, swelling in lower extremities,  anasarca, dizziness, palpitations, syncope.   GI  No heartburn, indigestion, abdominal pain, nausea, vomiting, diarrhea, change in bowel habits, loss of appetite, bloody stools.   Resp: No shortness of breath with exertion or at rest.  No excess mucus, no productive cough,  No non-productive cough,  No coughing up of blood.  No change in color of mucus.  No wheezing.  No chest wall deformity  Skin: no rash or lesions.  GU: no dysuria, change in color of urine, no urgency or frequency.  No flank pain, no hematuria   MS:  No joint pain or swelling.  No decreased range of motion.  No back pain.    Physical Exam  BP 90/64 (BP Location: Left Arm, Patient Position: Sitting, Cuff Size: Large) Comment: patient states this is her normal  Pulse 69   Temp 97.8 F (36.6 C) (Oral)   Ht '5\' 6"'$  (1.676 m)   Wt 172 lb 12.8 oz (78.4 kg)   LMP 08/19/2012   SpO2 100% Comment: 5L while at rest  BMI 27.89 kg/m   GEN: A/Ox3; pleasant , NAD, well nourished    HEENT:  [place]/AT,  EACs-clear, TMs-wnl, NOSE-clear, THROAT-clear, no lesions, no postnasal drip or exudate noted.   NECK:  Supple w/ fair ROM; no JVD; normal carotid impulses w/o bruits; no thyromegaly or nodules palpated; no lymphadenopathy.    RESP  Clear  P & A; w/o, wheezes/ rales/ or rhonchi. no accessory muscle use, no dullness to  percussion  CARD:  RRR, no m/r/g, no peripheral edema, pulses intact, no cyanosis or clubbing.  GI:   Soft & nt; nml bowel sounds; no organomegaly or masses detected.   Musco: Warm bil, no deformities or joint swelling noted.   Neuro: alert, no focal deficits noted.    Skin: Warm, no lesions or rashes    Lab Results:  CBC    Component Value Date/Time   WBC 5.2 08/27/2019 1430   WBC 6.3 06/27/2019 1218   RBC 4.82 08/27/2019 1430   HGB 13.3 08/27/2019 1430   HGB 12.7 07/12/2018 1436   HGB 13.6 09/30/2017 0954   HCT 42.4 08/27/2019 1430   HCT 44.0 07/12/2018 1436   HCT 44.8 09/30/2017 0954   PLT 164 08/27/2019 1430   PLT 206 07/12/2018 1436   MCV 88.0 08/27/2019 1430   MCV 72 (L) 07/12/2018 1436   MCV 76.8 (L) 09/30/2017 0954   MCH 27.6 08/27/2019 1430   MCHC 31.4 08/27/2019 1430   RDW 16.0 (H) 08/27/2019 1430   RDW 22.2 (H) 07/12/2018 1436   RDW 29.2 (H) 09/30/2017 0954   LYMPHSABS 1.2 08/27/2019 1430   LYMPHSABS 1.3 09/30/2017 0954   MONOABS 0.4 08/27/2019 1430   MONOABS 0.4 09/30/2017 0954   EOSABS 0.1 08/27/2019 1430   EOSABS 0.2 09/30/2017 0954   BASOSABS 0.0 08/27/2019 1430   BASOSABS 0.1 09/30/2017 0954    BMET    Component Value Date/Time   NA 142 08/27/2019 1430   NA 143 07/12/2018 1436   NA 141 09/30/2017 0954   K 2.9 (LL) 08/27/2019 1430   K 3.7 09/30/2017 0954   CL 100 08/27/2019 1430   CO2 34 (H) 08/27/2019 1430   CO2 24 09/30/2017 0954   GLUCOSE 97 08/27/2019 1430   GLUCOSE 96 09/30/2017 0954   BUN 12 08/27/2019 1430   BUN 17 07/12/2018 1436   BUN 8.8 09/30/2017 0954   CREATININE 1.04 (H) 08/27/2019 1430   CREATININE 0.8 09/30/2017 0954   CALCIUM  10.3 09/23/2020 1622   CALCIUM 10.4 09/30/2017 0954   GFRNONAA 60 (L) 08/27/2019 1430   GFRAA >60 08/27/2019 1430    BNP    Component Value Date/Time   BNP 510.0 (H) 05/27/2018 2347    ProBNP No results found for: "PROBNP"  Imaging: No results found.       Latest Ref Rng &  Units 08/03/2021   10:17 AM  PFT Results  FVC-Pre L 1.82   FVC-Predicted Pre % 64   FVC-Post L 1.91   FVC-Predicted Post % 68   Pre FEV1/FVC % % 53   Post FEV1/FCV % % 55   FEV1-Pre L 0.97   FEV1-Predicted Pre % 43   FEV1-Post L 1.04   DLCO uncorrected ml/min/mmHg 6.53   DLCO UNC% % 31   DLCO corrected ml/min/mmHg 6.53   DLCO COR %Predicted % 31   DLVA Predicted % 49   TLC L 5.93   TLC % Predicted % 113   RV % Predicted % 198     No results found for: "NITRICOXIDE"      Assessment & Plan:   OSA (obstructive sleep apnea) Patient says she cannot tolerate CPAP.  Continue on oxygen as discussed  Pulmonary hypertension ([place]) Suspect is multifactorial with underlying sleep apnea, chronic lung disease, diastolic heart failure-does not appear to have volume overload on exam.  Continue oxygen.     Rexene Edison, NP 12/13/2022

## 2022-12-13 NOTE — Assessment & Plan Note (Signed)
Patient says she cannot tolerate CPAP.  Continue on oxygen as discussed

## 2022-12-13 NOTE — Patient Instructions (Addendum)
Continue on Trelegy daily, rinse after use Albuterol inhaler /neb As needed   Continue on Oxygen to maintain O2 sats >88-90%.  Activity as tolerated Order for POC  Refer back to Lung cancer CT chest screening program .  Follow-up with Dr. Elsworth Soho in 4 months and as needed

## 2022-12-13 NOTE — Telephone Encounter (Signed)
This patient was seen in the office today and is overdue for her LD cancer screening CT.  Can you reach out to her to schedule?  Thank you.

## 2022-12-13 NOTE — Therapy (Incomplete)
OUTPATIENT PHYSICAL THERAPY NEURO EVALUATION   Patient Name: Teresa Roberts MRN: [id number] DOB:06-10-1961, 62 y.o., female Today's Date: 12/13/2022   PCP: Jonathon Jordan, MD  REFERRING PROVIDER: Gertie Gowda, DO  END OF SESSION:   Past Medical History:  Diagnosis Date   CAD (coronary artery disease)    a. 01/2017: cath showing 20% mid RCA stenosis and otherwise normal LAD and LCx with a preserved EF of 55 to 60%; mod pulmonary HTN   COPD (chronic obstructive pulmonary disease) ([place])    O2 dependent   cpap    Depression    PTSD   Diastolic CHF ([place])    Fibromyalgia    Hyperlipidemia    Hypertension    Migraines    Past Surgical History:  Procedure Laterality Date   APPENDECTOMY     BIOPSY  01/01/2021   Procedure: BIOPSY;  Surgeon: Wilford Corner, MD;  [facility];  Service: Endoscopy;;   CESAREAN SECTION     COLONOSCOPY WITH PROPOFOL N/A 01/01/2021   Procedure: COLONOSCOPY WITH PROPOFOL;  Surgeon: Wilford Corner, MD;  [facility];  Service: Endoscopy;  Laterality: N/A;   ESOPHAGOGASTRODUODENOSCOPY (EGD) WITH PROPOFOL N/A 01/01/2021   Procedure: ESOPHAGOGASTRODUODENOSCOPY (EGD) WITH PROPOFOL;  Surgeon: Wilford Corner, MD;  [facility];  Service: Endoscopy;  Laterality: N/A;   ORIF ANKLE FRACTURE Left 08/12/2017   Procedure: OPEN REDUCTION INTERNAL FIXATION (ORIF) ANKLE FRACTURE;  Surgeon: Rod Can, MD;  Location: [place];  Service: Orthopedics;  Laterality: Left;   RIGHT HEART CATH N/A 07/19/2018   Procedure: RIGHT HEART CATH;  Surgeon: Jolaine Artist, MD;  Location: [facility];  Service: Cardiovascular;  Laterality: N/A;   RIGHT/LEFT HEART CATH AND CORONARY ANGIOGRAPHY N/A 01/27/2017   Procedure: Right/Left Heart Cath and Coronary Angiography;  Surgeon: Troy Sine, MD;  Location: [facility] [place];  Service: Cardiovascular;  Laterality: N/A;   Patient Active Problem List   Diagnosis Date Noted   Sciatica of  left side associated with disorder of lumbar spine 09/13/2022   Chronic pain syndrome 09/13/2022   Encounter for therapeutic drug monitoring 09/13/2022   Personal history of colonic polyps 01/01/2021   Esophageal reflux 01/01/2021   Hypercalcemia 09/23/2020   Hyperparathyroidism, primary ([place]) 08/25/2020   Iron deficiency anemia due to chronic blood loss 06/27/2019   Coronary artery disease involving native coronary artery of native heart without angina pectoris 09/05/2018   Syncope and collapse 09/05/2018   Pain in joint of left elbow 09/04/2018   Chest pain 05/27/2018   HLD (hyperlipidemia) 05/27/2018   Essential hypertension 05/27/2018   Depression 05/27/2018   OSA (obstructive sleep apnea) 05/03/2018   Pulmonary hypertension ([place]) 05/03/2018   Bimalleolar fracture of left ankle 08/12/2017   Acute renal failure (ARF) ([place]) 08/09/2017   Chronic diastolic (congestive) heart failure ([place]) 08/09/2017   Other fatigue 06/13/2017   Primary insomnia 06/13/2017   History of migraine 06/13/2017   COPD (chronic obstructive pulmonary disease) ([place]) 02/15/2017   Tobacco abuse 02/15/2017   Respiratory failure with hypoxia ([place]) 01/28/2017   Migraine 01/28/2017   Daily headache    Hypoxia    Non-ST elevation myocardial infarction (NSTEMI), type 2 01/26/2017   MGUS (monoclonal gammopathy of unknown significance) 01/11/2012   Fibromyalgia 01/11/2012   Bruises easily 01/11/2012    ONSET DATE: ***  REFERRING DIAG:  M79.7 (ICD-10-CM) - Fibromyalgia  G89.4 (ICD-10-CM) - Chronic pain syndrome  M53.86 (ICD-10-CM) - Sciatica of left side associated with disorder of lumbar spine  THERAPY DIAG:  No diagnosis found.  Rationale for Evaluation and Treatment: Rehabilitation  SUBJECTIVE:                                                                                                                                                                                             SUBJECTIVE  STATEMENT: *** Pt accompanied by: {accompnied:27141}  PERTINENT HISTORY: CAD, COPD, depression, diastolic CHF, fibromyalgia, HLD, HTN, migraines, L ankle ORIF, osteoporosis  PAIN:  Are you having pain? {OPRCPAIN:27236}  PRECAUTIONS: {Therapy precautions:24002}  WEIGHT BEARING RESTRICTIONS: No  FALLS: Has patient fallen in last 6 months? {fallsyesno:27318}  LIVING ENVIRONMENT: Lives with: {OPRC lives with:25569::"lives with their family"} Lives in: {Lives in:25570} Stairs: {opstairs:27293} Has following equipment at home: {Assistive devices:23999}  PLOF: {PLOF:24004}  PATIENT GOALS: ***  OBJECTIVE:   DIAGNOSTIC FINDINGS: Lumbar x-ray ordered   COGNITION: Overall cognitive status: {cognition:24006}   SENSATION: {sensation:27233}  COORDINATION: ***  EDEMA:  {edema:24020}  POSTURE: {posture:25561}  LOWER EXTREMITY ROM:     Active  Right Eval Left Eval  Hip flexion    Hip extension    Hip abduction    Hip adduction    Hip internal rotation    Hip external rotation    Knee flexion    Knee extension    Ankle dorsiflexion    Ankle plantarflexion    Ankle inversion    Ankle eversion     (Blank rows = not tested)  LOWER EXTREMITY MMT:    MMT Right Eval Left Eval  Hip flexion    Hip extension    Hip abduction    Hip adduction    Hip internal rotation    Hip external rotation    Knee flexion    Knee extension    Ankle dorsiflexion    Ankle plantarflexion    Ankle inversion    Ankle eversion    (Blank rows = not tested)   TRANSFERS: Assistive device utilized: {Assistive devices:23999}  Sit to stand: {Levels of assistance:24026} Stand to sit: {Levels of assistance:24026} Chair to chair: {Levels of assistance:24026} Floor: {Levels of assistance:24026}   GAIT: Gait pattern: {gait characteristics:25376} Distance walked: *** Assistive device utilized: {Assistive devices:23999} Level of assistance: {Levels of assistance:24026} Comments:  ***  FUNCTIONAL TESTS:  {Functional tests:24029}   TODAY'S TREATMENT:  DATE: ***    PATIENT EDUCATION: Education details: *** Person educated: {Person educated:25204} Education method: {Education Method:25205} Education comprehension: {Education Comprehension:25206}  HOME EXERCISE PROGRAM: ***   GOALS: Goals reviewed with patient? Yes  SHORT TERM GOALS: Target date: {follow up:25551}  Patient to be independent with initial HEP. Baseline: HEP initiated Goal status: {GOALSTATUS:25110}    LONG TERM GOALS: Target date: {follow up:25551}  Patient to be independent with advanced HEP. Baseline: Not yet initiated  Goal status: {GOALSTATUS:25110}  Patient to demonstrate B LE strength >/=4+/5.  Baseline: See above Goal status: {GOALSTATUS:25110}  Patient to demonstrate *** ROM WFL and without pain limiting.  Baseline: *** Goal status: {GOALSTATUS:25110}  Patient to report and demonstrate improved head, neck, and shoulder posture at rest and with activity.  Baseline: *** Goal status: {GOALSTATUS:25110}  Patient to demonstrate alternating reciprocal pattern when ascending and descending stairs with good stability and 1 handrail as needed.   Baseline: Unable Goal status: {GOALSTATUS:25110}  Patient to score at least 20/24 on DGI in order to decrease risk of falls.  Baseline: *** Goal status: {GOALSTATUS:25110}  Patient to complete TUG in <14 sec with LRAD in order to decrease risk of falls.   Baseline: *** Goal status: {GOALSTATUS:25110}  Patient to demonstrate 5xSTS test in <15 sec in order to decrease risk of falls.  Baseline: *** Goal status: {GOALSTATUS:25110}  Patient to score at least ***/56 on [place] in order to decrease risk of falls.  Baseline: *** Goal status: {GOALSTATUS:25110}  Patient to score at least *** on FOTO in order to  indicate improved functional outcomes.  Baseline: *** Goal status: {GOALSTATUS:25110}   ASSESSMENT:  CLINICAL IMPRESSION:  Patient is a 62 y/o F presenting to OPPT with c/o *** for the past ***  Patient today presenting with ***.    Patient was educated on gentle *** HEP and reported understanding. Prior to current episode, patient was independent. Would benefit from skilled PT services *** x/week for *** weeks to address aforementioned impairments in order to optimize level of function.    OBJECTIVE IMPAIRMENTS: {opptimpairments:25111}.   ACTIVITY LIMITATIONS: {activitylimitations:27494}  PARTICIPATION LIMITATIONS: {participationrestrictions:25113}  PERSONAL FACTORS: {Personal factors:25162} are also affecting patient's functional outcome.   REHAB POTENTIAL: {rehabpotential:25112}  CLINICAL DECISION MAKING: {clinical decision making:25114}  EVALUATION COMPLEXITY: {Evaluation complexity:25115}  PLAN:  PT FREQUENCY: {rehab frequency:25116}  PT DURATION: {rehab duration:25117}  PLANNED INTERVENTIONS: {rehab planned interventions:25118::"Therapeutic exercises","Therapeutic activity","Neuromuscular re-education","Balance training","Gait training","Patient/Family education","Self Care","Joint mobilization"}  PLAN FOR NEXT SESSION: Manuela Neptune, PT 12/13/2022, 9:47 AM

## 2022-12-13 NOTE — Assessment & Plan Note (Signed)
Suspect is multifactorial with underlying sleep apnea, chronic lung disease, diastolic heart failure-does not appear to have volume overload on exam.  Continue oxygen.

## 2022-12-14 ENCOUNTER — Other Ambulatory Visit: Payer: Self-pay | Admitting: *Deleted

## 2022-12-14 ENCOUNTER — Other Ambulatory Visit: Payer: Self-pay

## 2022-12-14 DIAGNOSIS — Z122 Encounter for screening for malignant neoplasm of respiratory organs: Secondary | ICD-10-CM

## 2022-12-14 DIAGNOSIS — Z720 Tobacco use: Secondary | ICD-10-CM

## 2022-12-14 DIAGNOSIS — Z87891 Personal history of nicotine dependence: Secondary | ICD-10-CM

## 2022-12-14 NOTE — Telephone Encounter (Signed)
Oh, ok, I will just do a referral then.  Thank you.

## 2022-12-15 ENCOUNTER — Ambulatory Visit: Payer: Medicare Other | Attending: Physical Medicine and Rehabilitation | Admitting: Physical Therapy

## 2022-12-22 DIAGNOSIS — R062 Wheezing: Secondary | ICD-10-CM | POA: Diagnosis not present

## 2022-12-22 DIAGNOSIS — J449 Chronic obstructive pulmonary disease, unspecified: Secondary | ICD-10-CM | POA: Diagnosis not present

## 2022-12-22 DIAGNOSIS — G4733 Obstructive sleep apnea (adult) (pediatric): Secondary | ICD-10-CM | POA: Diagnosis not present

## 2022-12-22 DIAGNOSIS — J9611 Chronic respiratory failure with hypoxia: Secondary | ICD-10-CM | POA: Diagnosis not present

## 2022-12-22 DIAGNOSIS — J441 Chronic obstructive pulmonary disease with (acute) exacerbation: Secondary | ICD-10-CM | POA: Diagnosis not present

## 2022-12-23 ENCOUNTER — Other Ambulatory Visit: Payer: Self-pay | Admitting: *Deleted

## 2022-12-23 ENCOUNTER — Telehealth: Payer: Self-pay | Admitting: Adult Health

## 2022-12-23 DIAGNOSIS — J4489 Other specified chronic obstructive pulmonary disease: Secondary | ICD-10-CM

## 2022-12-23 NOTE — Telephone Encounter (Signed)
In need of a new order for POC   TP does not have order in to be signed   Order was placed 1/29 - not yet signed   Thanks

## 2022-12-23 NOTE — Telephone Encounter (Signed)
New order placed

## 2022-12-23 NOTE — Telephone Encounter (Signed)
Nothing further needed . 

## 2022-12-29 ENCOUNTER — Encounter: Payer: Self-pay | Admitting: Physical Therapy

## 2022-12-29 ENCOUNTER — Ambulatory Visit: Payer: Medicare Other | Attending: Physical Medicine and Rehabilitation | Admitting: Physical Therapy

## 2022-12-29 DIAGNOSIS — M6281 Muscle weakness (generalized): Secondary | ICD-10-CM | POA: Insufficient documentation

## 2022-12-29 DIAGNOSIS — R262 Difficulty in walking, not elsewhere classified: Secondary | ICD-10-CM | POA: Insufficient documentation

## 2022-12-29 DIAGNOSIS — M5386 Other specified dorsopathies, lumbar region: Secondary | ICD-10-CM | POA: Insufficient documentation

## 2022-12-29 DIAGNOSIS — M545 Low back pain, unspecified: Secondary | ICD-10-CM | POA: Diagnosis not present

## 2022-12-29 DIAGNOSIS — M797 Fibromyalgia: Secondary | ICD-10-CM | POA: Diagnosis not present

## 2022-12-29 DIAGNOSIS — G894 Chronic pain syndrome: Secondary | ICD-10-CM | POA: Diagnosis not present

## 2022-12-29 NOTE — Therapy (Signed)
OUTPATIENT PHYSICAL THERAPY THORACOLUMBAR EVALUATION   Patient Name: Teresa Roberts MRN: [id number] DOB:[birth year], 62 y.o., female Today's Date: 12/29/2022  END OF SESSION:  PT End of Session - 12/29/22 1409     Visit Number 1    Number of Visits 9    Date for PT Re-Evaluation 02/23/23    Authorization Type [employer] MCR    Authorization Time Period 12/29/22 to 02/23/23    Progress Note Due on Visit 10    PT Start Time 1320    PT Stop Time 1359    PT Time Calculation (min) 39 min    Activity Tolerance Patient tolerated treatment well    Behavior During Therapy [facility] for tasks assessed/performed             Past Medical History:  Diagnosis Date   CAD (coronary artery disease)    a. 01/2017: cath showing 20% mid RCA stenosis and otherwise normal LAD and LCx with a preserved EF of 55 to 60%; mod pulmonary HTN   COPD (chronic obstructive pulmonary disease) ([place])    O2 dependent   cpap    Depression    PTSD   Diastolic CHF ([place])    Fibromyalgia    Hyperlipidemia    Hypertension    Migraines    Past Surgical History:  Procedure Laterality Date   APPENDECTOMY     BIOPSY  01/01/2021   Procedure: BIOPSY;  Surgeon: Wilford Corner, MD;  [facility];  Service: Endoscopy;;   CESAREAN SECTION     COLONOSCOPY WITH PROPOFOL N/A 01/01/2021   Procedure: COLONOSCOPY WITH PROPOFOL;  Surgeon: Wilford Corner, MD;  [facility];  Service: Endoscopy;  Laterality: N/A;   ESOPHAGOGASTRODUODENOSCOPY (EGD) WITH PROPOFOL N/A 01/01/2021   Procedure: ESOPHAGOGASTRODUODENOSCOPY (EGD) WITH PROPOFOL;  Surgeon: Wilford Corner, MD;  [facility];  Service: Endoscopy;  Laterality: N/A;   ORIF ANKLE FRACTURE Left 08/12/2017   Procedure: OPEN REDUCTION INTERNAL FIXATION (ORIF) ANKLE FRACTURE;  Surgeon: Rod Can, MD;  Location: [place];  Service: Orthopedics;  Laterality: Left;   RIGHT HEART CATH N/A 07/19/2018   Procedure: RIGHT HEART CATH;  Surgeon: Jolaine Artist,  MD;  Location: [facility] [place] [facility];  Service: Cardiovascular;  Laterality: N/A;   RIGHT/LEFT HEART CATH AND CORONARY ANGIOGRAPHY N/A 01/27/2017   Procedure: Right/Left Heart Cath and Coronary Angiography;  Surgeon: Troy Sine, MD;  Location: [facility] [facility];  Service: Cardiovascular;  Laterality: N/A;   Patient Active Problem List   Diagnosis Date Noted   Sciatica of left side associated with disorder of lumbar spine 09/13/2022   Chronic pain syndrome 09/13/2022   Encounter for therapeutic drug monitoring 09/13/2022   Personal history of colonic polyps 01/01/2021   Esophageal reflux 01/01/2021   Hypercalcemia 09/23/2020   Hyperparathyroidism, primary ([place]) 08/25/2020   Iron deficiency anemia due to chronic blood loss 06/27/2019   Coronary artery disease involving native coronary artery of native heart without angina pectoris 09/05/2018   Syncope and collapse 09/05/2018   Pain in joint of left elbow 09/04/2018   Chest pain 05/27/2018   HLD (hyperlipidemia) 05/27/2018   Essential hypertension 05/27/2018   Depression 05/27/2018   OSA (obstructive sleep apnea) 05/03/2018   Pulmonary hypertension ([place]) 05/03/2018   Bimalleolar fracture of left ankle 08/12/2017   Acute renal failure (ARF) ([place]) 08/09/2017   Chronic diastolic (congestive) heart failure ([place]) 08/09/2017   Other fatigue 06/13/2017   Primary insomnia 06/13/2017   History of migraine 06/13/2017   COPD (chronic obstructive  pulmonary disease) ([place]) 02/15/2017   Tobacco abuse 02/15/2017   Respiratory failure with hypoxia ([place]) 01/28/2017   Migraine 01/28/2017   Daily headache    Hypoxia    Non-ST elevation myocardial infarction (NSTEMI), type 2 01/26/2017   MGUS (monoclonal gammopathy of unknown significance) 01/11/2012   Fibromyalgia 01/11/2012   Bruises easily 01/11/2012    PCP: Jonathon Jordan MD   REFERRING PROVIDER: Gertie Gowda, DO  REFERRING DIAG: M79.7 (ICD-10-CM) - Fibromyalgia G89.4 (ICD-10-CM) -  Chronic pain syndrome M53.86 (ICD-10-CM) - Sciatica of left side associated with disorder of lumbar spine  Rationale for Evaluation and Treatment: Rehabilitation  THERAPY DIAG:  Muscle weakness (generalized)  Pain, lumbar region  Difficulty in walking, not elsewhere classified  ONSET DATE: 12/08/2022  SUBJECTIVE:                                                                                                                                                                                           SUBJECTIVE STATEMENT:  My pain has been going on for a long time, over a year. Walking, standing, lifting are all hard due to pain. Just know I'm in pain.   PERTINENT HISTORY:  Teresa Roberts is a 62 y.o. year old female year old female  who  has a past medical history of CAD (coronary artery disease), COPD (chronic obstructive pulmonary disease) ([place]), cpap, Depression, Diastolic CHF ([place]), Fibromyalgia, Hyperlipidemia, Hypertension, and Migraines.   They are presenting to PM&R clinic for follow up of multimodal chronic pain; likely L L4 -L5 radiculopathy,  fibromyalgia, and facet arthropathy.       Chronic pain syndrome Assessment & Plan: Patient was noncompliant with all recommendations from last visit due to worsening generalized fatigue, pain, and depression. Had long discussion regarding the interplay of depression/anxiety with chronic pain syndromes such as fibromyalgia.    UDS last visit was appropriate, will continue non-narcotic management of chronic pain and will inform PCP of patient's desire to pursue therapy for uncontrolled depression and chronic fatigue.    Follow up with me in 3 months. We may perform trigger point injections that visit.    Orders: -     Ambulatory referral to Physical Therapy   Current moderate episode of major depressive disorder, unspecified whether recurrent (HCC) Assessment & Plan: I am increasing your Duloxetine from 30 mg once daily to 30 mg twice daily for depression.  I am also going to talk to your PCP about a therapy referral.      Fibromyalgia Assessment & Plan: I am cross-tapering you from Gabapentin to Lyrica. You will half your regular doses of gabapentin and take 1 tablet of Lyrica  twice daily for 1 week; then you will increase to Lyrica 2 tablets twice daily and stop gabapentin. Call me in 1-2 weeks to report effects.    I have placed an order for aquatherapy to get you moving in a gentle environment that offloads your joints. I did note need to stay in shallow water near poolside due to need for oxygen.    Orders: -     Ambulatory referral to Physical Therapy   Sciatica of left side associated with disorder of lumbar spine Assessment & Plan: Please request records from your recent MRI for review.    Schedule an injection with Dr. Nelva Bush as soon as possible. I will ask about this at out call in 2 weeks.     PAIN:  Are you having pain? Yes: NPRS scale: 7/10; "12/10 at worst", 5/10 at best  Pain location: lower back  Pain description: aching Aggravating factors: nothing  Relieving factors: laying down with heating pad   PRECAUTIONS: Other: O2 5LPM at baseline   WEIGHT BEARING RESTRICTIONS: No  FALLS:  Has patient fallen in last 6 months? No  LIVING ENVIRONMENT: Lives with: lives with their family and lives with their spouse Lives in: House/apartment Stairs: no STE, 13 steps inside the home but does not have to navigate them  Has following equipment at home:  O2, SPC   OCCUPATION: retired   PLOF: Independent, Independent with basic ADLs, Independent with gait, and Independent with transfers  PATIENT GOALS: decrease pain as much as possible   NEXT MD VISIT: Dr. Tressa Busman next month   OBJECTIVE:   DIAGNOSTIC FINDINGS:    PATIENT SURVEYS:    SCREENING FOR RED FLAGS: Bowel or bladder incontinence: No Spinal tumors: No Cauda equina syndrome: No Compression fracture: No Abdominal aneurysm: No  COGNITION: Overall cognitive  status: Within functional limits for tasks assessed     SENSATION: Not tested  MUSCLE LENGTH: L HS mild limitation, R moderate limitation; B piriformis severe limitation; hip flexors moderate limitation   POSTURE: rounded shoulders, forward head, increased lumbar lordosis, increased thoracic kyphosis, and flexed trunk   PALPATION:  Significant lumbar paraspinal spasms noted L>R  LUMBAR ROM:   AROM eval  Flexion Moderate limitation   Extension Mild limitation   Right lateral flexion WNL   Left lateral flexion WNL  Right rotation Severe limitation   Left rotation Moderate limitation    (Blank rows = not tested)  LOWER EXTREMITY ROM:     Active  Right eval Left eval  Hip flexion    Hip extension    Hip abduction    Hip adduction    Hip internal rotation    Hip external rotation    Knee flexion    Knee extension    Ankle dorsiflexion    Ankle plantarflexion    Ankle inversion    Ankle eversion     (Blank rows = not tested)  LOWER EXTREMITY MMT:    MMT Right eval Left eval  Hip flexion 3- 3-  Hip extension    Hip abduction 3- 3-  Hip adduction    Hip internal rotation    Hip external rotation    Knee flexion 3 2+ at best   Knee extension 4 4  Ankle dorsiflexion 5 5  Ankle plantarflexion    Ankle inversion    Ankle eversion     (Blank rows = not tested)  LUMBAR SPECIAL TESTS:  Straight leg raise test: Positive L LE, excess sciatic/neural tension noted L  LE   FUNCTIONAL TESTS:    GAIT: Distance walked: in clinic distances  Assistive device utilized: None Level of assistance: Complete Independence Comments: limited trunk rotation, very stiff and antalgic   TODAY'S TREATMENT:                                                                                                                              DATE:   Eval   Objective measures/appropriate education  TherEx  Nustep L2 BLEs only x6 minutes   Posterior pelvic tilt x10 TA set x10  supine Lumbar rotation stretch 5x3 second holds B Supine figure 4 stretch 30 seconds R LE, 10 seconds L LE   PATIENT EDUCATION:  Education details: exam findings, POC, HEP, water therapy and necessity of re-evals on land/transition to land  Person educated: Patient Education method: Explanation Education comprehension: verbalized understanding, returned demonstration, and needs further education  HOME EXERCISE PROGRAM: Access Code: [email or web address] URL: [email or web address] Date: 12/29/2022 Prepared by: Deniece Ree  Exercises - Supine Posterior Pelvic Tilt  - 2 x daily - 7 x weekly - 1 sets - 10 reps - 2 hold - Supine Lower Trunk Rotation  - 2 x daily - 7 x weekly - 1 sets - 10 reps - 3 hold - Supine Transversus Abdominis Bracing - Hands on Stomach  - 2 x daily - 7 x weekly - 1 sets - 10 reps - 3 hold - Seated Figure 4 Piriformis Stretch  - 2 x daily - 7 x weekly - 1 sets - 2 reps - 10-30 hold  ASSESSMENT:  CLINICAL IMPRESSION: Patient is a 62 y.o. F who was seen today for physical therapy evaluation and treatment for chronic pain, fibromyalgia, and sciatica. Exam reveals significant functional muscle weakness, stiffness in lumbar spine and hips, postural limitations, increased neural tension in L sciatic area, and ongoing chronic pain limiting functional activity tolerance. Will benefit from skilled PT services to attempt to address all impairments   OBJECTIVE IMPAIRMENTS: Abnormal gait, decreased mobility, difficulty walking, decreased ROM, decreased strength, hypomobility, increased fascial restrictions, increased muscle spasms, impaired flexibility, postural dysfunction, and pain.   ACTIVITY LIMITATIONS: carrying, lifting, standing, squatting, stairs, transfers, bed mobility, and locomotion level  PARTICIPATION LIMITATIONS: cleaning, driving, shopping, community activity, yard work, and church  PERSONAL FACTORS: Age, Behavior pattern, Education, Fitness, Past/current  experiences, Social background, and Time since onset of injury/illness/exacerbation are also affecting patient's functional outcome.   REHAB POTENTIAL: Fair limited success with PT in the past   CLINICAL DECISION MAKING: Evolving/moderate complexity  EVALUATION COMPLEXITY: Moderate   GOALS: Goals reviewed with patient? Yes  SHORT TERM GOALS: Target date: 01/26/2023    Will be compliant with appropriate progressive HEP  Baseline: Goal status: INITIAL  2.  Pain to be no more than 8/10 at worst  Baseline:  Goal status: INITIAL  3.  Lumbar flexion and rotation to be no more than mildly limited  Baseline:  Goal status: INITIAL  4.  Piriformis and R HS flexibility to have improved by 50%  Baseline:  Goal status: INITIAL   LONG TERM GOALS: Target date: 02/23/2023    MMT to improve by at least 1 grade in all weak groups  Baseline:  Goal status: INITIAL  2.  Pain to be no more than 5/10 at worst  Baseline:  Goal status: INITIAL  3.  Will be able to walk community distances in locations such as the grocery store without increase in pain from baseline  Baseline:  Goal status: INITIAL  4.  Will be compliant with advanced HEP vs independent pool exercise program to maintain functional gains and assist with pain control after DC from PT  Baseline:  Goal status: INITIAL   PLAN:  PT FREQUENCY: 1x/week  PT DURATION: 8 weeks  PLANNED INTERVENTIONS: Therapeutic exercises, Therapeutic activity, Neuromuscular re-education, Balance training, Gait training, Patient/Family education, Self Care, Joint mobilization, Stair training, Orthotic/Fit training, DME instructions, Aquatic Therapy, Dry Needling, Electrical stimulation, Spinal mobilization, Cryotherapy, Moist heat, Taping, Ultrasound, Ionotophoresis 61m/ml Dexamethasone, Manual therapy, and Re-evaluation.  PLAN FOR NEXT SESSION: water therapy and transition back to land as appropriate/when ready; gentle progression of all  mobility   KDeniece ReePT DPT PN2

## 2023-01-06 DIAGNOSIS — J441 Chronic obstructive pulmonary disease with (acute) exacerbation: Secondary | ICD-10-CM | POA: Diagnosis not present

## 2023-01-06 DIAGNOSIS — J449 Chronic obstructive pulmonary disease, unspecified: Secondary | ICD-10-CM | POA: Diagnosis not present

## 2023-01-06 DIAGNOSIS — R062 Wheezing: Secondary | ICD-10-CM | POA: Diagnosis not present

## 2023-01-06 DIAGNOSIS — J9611 Chronic respiratory failure with hypoxia: Secondary | ICD-10-CM | POA: Diagnosis not present

## 2023-01-12 ENCOUNTER — Ambulatory Visit (HOSPITAL_BASED_OUTPATIENT_CLINIC_OR_DEPARTMENT_OTHER): Payer: Medicare Other | Admitting: Physical Therapy

## 2023-01-19 ENCOUNTER — Ambulatory Visit (HOSPITAL_BASED_OUTPATIENT_CLINIC_OR_DEPARTMENT_OTHER): Payer: Medicare Other | Attending: Physical Medicine and Rehabilitation | Admitting: Physical Therapy

## 2023-01-19 DIAGNOSIS — I272 Pulmonary hypertension, unspecified: Secondary | ICD-10-CM | POA: Insufficient documentation

## 2023-01-19 DIAGNOSIS — Z87891 Personal history of nicotine dependence: Secondary | ICD-10-CM | POA: Insufficient documentation

## 2023-01-19 DIAGNOSIS — Z8709 Personal history of other diseases of the respiratory system: Secondary | ICD-10-CM | POA: Insufficient documentation

## 2023-01-19 DIAGNOSIS — M62838 Other muscle spasm: Secondary | ICD-10-CM | POA: Insufficient documentation

## 2023-01-19 DIAGNOSIS — I2510 Atherosclerotic heart disease of native coronary artery without angina pectoris: Secondary | ICD-10-CM | POA: Insufficient documentation

## 2023-01-19 DIAGNOSIS — I10 Essential (primary) hypertension: Secondary | ICD-10-CM | POA: Insufficient documentation

## 2023-01-19 DIAGNOSIS — M797 Fibromyalgia: Secondary | ICD-10-CM | POA: Insufficient documentation

## 2023-01-19 DIAGNOSIS — D472 Monoclonal gammopathy: Secondary | ICD-10-CM | POA: Insufficient documentation

## 2023-01-19 DIAGNOSIS — R5383 Other fatigue: Secondary | ICD-10-CM | POA: Insufficient documentation

## 2023-01-19 DIAGNOSIS — F419 Anxiety disorder, unspecified: Secondary | ICD-10-CM | POA: Insufficient documentation

## 2023-01-19 DIAGNOSIS — Z8669 Personal history of other diseases of the nervous system and sense organs: Secondary | ICD-10-CM | POA: Insufficient documentation

## 2023-01-19 DIAGNOSIS — M7062 Trochanteric bursitis, left hip: Secondary | ICD-10-CM | POA: Insufficient documentation

## 2023-01-19 DIAGNOSIS — M7061 Trochanteric bursitis, right hip: Secondary | ICD-10-CM | POA: Insufficient documentation

## 2023-01-19 DIAGNOSIS — G8929 Other chronic pain: Secondary | ICD-10-CM | POA: Insufficient documentation

## 2023-01-19 DIAGNOSIS — M545 Low back pain, unspecified: Secondary | ICD-10-CM | POA: Insufficient documentation

## 2023-01-19 DIAGNOSIS — F5101 Primary insomnia: Secondary | ICD-10-CM | POA: Insufficient documentation

## 2023-01-19 DIAGNOSIS — I5032 Chronic diastolic (congestive) heart failure: Secondary | ICD-10-CM | POA: Insufficient documentation

## 2023-01-19 DIAGNOSIS — G5603 Carpal tunnel syndrome, bilateral upper limbs: Secondary | ICD-10-CM | POA: Insufficient documentation

## 2023-01-20 DIAGNOSIS — J9611 Chronic respiratory failure with hypoxia: Secondary | ICD-10-CM | POA: Diagnosis not present

## 2023-01-20 DIAGNOSIS — G4733 Obstructive sleep apnea (adult) (pediatric): Secondary | ICD-10-CM | POA: Diagnosis not present

## 2023-01-20 DIAGNOSIS — R062 Wheezing: Secondary | ICD-10-CM | POA: Diagnosis not present

## 2023-01-20 DIAGNOSIS — J441 Chronic obstructive pulmonary disease with (acute) exacerbation: Secondary | ICD-10-CM | POA: Diagnosis not present

## 2023-01-20 DIAGNOSIS — J449 Chronic obstructive pulmonary disease, unspecified: Secondary | ICD-10-CM | POA: Diagnosis not present

## 2023-01-25 NOTE — Progress Notes (Unsigned)
Office Visit Note  Patient: Teresa Roberts             Date of Birth: [birth year]           MRN: [id number]             PCP: Jonathon Jordan, MD Referring: Jonathon Jordan, MD Visit Date: 02/08/2023 Occupation: @GUAROCC @  Subjective:    History of Present Illness: Teresa Roberts is a 62 y.o. female with history of fibromyalgia. She remains on cymbalta, lyrica, zanaflex, and trazodone as prescribed.     Activities of Daily Living:  Patient reports morning stiffness for 30 minutes.   Patient Reports nocturnal pain.  Difficulty dressing/grooming: Reports Difficulty climbing stairs: Reports Difficulty getting out of chair: Denies Difficulty using hands for taps, buttons, cutlery, and/or writing: Reports  Review of Systems  Constitutional:  Positive for fatigue.  HENT:  Positive for mouth dryness. Negative for mouth sores.   Eyes:  Negative for dryness.  Respiratory:  Positive for shortness of breath.   Cardiovascular:  Positive for chest pain and palpitations.  Gastrointestinal:  Positive for blood in stool. Negative for constipation and diarrhea.  Endocrine: Negative for increased urination.  Genitourinary:  Negative for involuntary urination.  Musculoskeletal:  Positive for joint pain, gait problem, joint pain, myalgias, muscle weakness, morning stiffness, muscle tenderness and myalgias. Negative for joint swelling.  Skin:  Negative for color change, rash, hair loss and sensitivity to sunlight.  Allergic/Immunologic: Negative for susceptible to infections.  Neurological:  Positive for dizziness and headaches.  Hematological:  Negative for swollen glands.  Psychiatric/Behavioral:  Positive for depressed mood and sleep disturbance. The patient is nervous/anxious.     PMFS History:  Patient Active Problem List   Diagnosis Date Noted   Sciatica of left side associated with disorder of lumbar spine 09/13/2022   Chronic pain syndrome 09/13/2022   Encounter for therapeutic drug  monitoring 09/13/2022   Personal history of colonic polyps 01/01/2021   Esophageal reflux 01/01/2021   Hypercalcemia 09/23/2020   Hyperparathyroidism, primary ([place]) 08/25/2020   Iron deficiency anemia due to chronic blood loss 06/27/2019   Coronary artery disease involving native coronary artery of native heart without angina pectoris 09/05/2018   Syncope and collapse 09/05/2018   Pain in joint of left elbow 09/04/2018   Chest pain 05/27/2018   HLD (hyperlipidemia) 05/27/2018   Essential hypertension 05/27/2018   Depression 05/27/2018   OSA (obstructive sleep apnea) 05/03/2018   Pulmonary hypertension ([place]) 05/03/2018   Bimalleolar fracture of left ankle 08/12/2017   Acute renal failure (ARF) ([place]) 08/09/2017   Chronic diastolic (congestive) heart failure ([place]) 08/09/2017   Other fatigue 06/13/2017   Primary insomnia 06/13/2017   History of migraine 06/13/2017   COPD (chronic obstructive pulmonary disease) ([place]) 02/15/2017   Tobacco abuse 02/15/2017   Respiratory failure with hypoxia ([place]) 01/28/2017   Migraine 01/28/2017   Daily headache    Hypoxia    Non-ST elevation myocardial infarction (NSTEMI), type 2 01/26/2017   MGUS (monoclonal gammopathy of unknown significance) 01/11/2012   Fibromyalgia 01/11/2012   Bruises easily 01/11/2012    Past Medical History:  Diagnosis Date   CAD (coronary artery disease)    a. 01/2017: cath showing 20% mid RCA stenosis and otherwise normal LAD and LCx with a preserved EF of 55 to 60%; mod pulmonary HTN   COPD (chronic obstructive pulmonary disease) ([place])    O2 dependent   cpap    Depression    PTSD   Diastolic CHF ([place])  Fibromyalgia    Hyperlipidemia    Hypertension    Migraines     Family History  Problem Relation Age of Onset   Diabetes Mother    Hypertension Mother    CAD Mother    Diabetes Father    Lung cancer Father    Diabetes Sister    Hypercalcemia Neg Hx    Breast cancer Neg Hx    Past Surgical History:   Procedure Laterality Date   APPENDECTOMY     BIOPSY  01/01/2021   Procedure: BIOPSY;  Surgeon: Wilford Corner, MD;  Location: [facility];  Service: Endoscopy;;   CESAREAN SECTION     COLONOSCOPY WITH PROPOFOL N/A 01/01/2021   Procedure: COLONOSCOPY WITH PROPOFOL;  Surgeon: Wilford Corner, MD;  Location: [facility];  Service: Endoscopy;  Laterality: N/A;   ESOPHAGOGASTRODUODENOSCOPY (EGD) WITH PROPOFOL N/A 01/01/2021   Procedure: ESOPHAGOGASTRODUODENOSCOPY (EGD) WITH PROPOFOL;  Surgeon: Wilford Corner, MD;  Location: [facility];  Service: Endoscopy;  Laterality: N/A;   ORIF ANKLE FRACTURE Left 08/12/2017   Procedure: OPEN REDUCTION INTERNAL FIXATION (ORIF) ANKLE FRACTURE;  Surgeon: Rod Can, MD;  Location: [facility];  Service: Orthopedics;  Laterality: Left;   RIGHT HEART CATH N/A 07/19/2018   Procedure: RIGHT HEART CATH;  Surgeon: Jolaine Artist, MD;  Location: [facility];  Service: Cardiovascular;  Laterality: N/A;   RIGHT/LEFT HEART CATH AND CORONARY ANGIOGRAPHY N/A 01/27/2017   Procedure: Right/Left Heart Cath and Coronary Angiography;  Surgeon: Troy Sine, MD;  Location: [facility];  Service: Cardiovascular;  Laterality: N/A;   Social History   Social History Narrative   Not on file   Immunization History  Administered Date(s) Administered   Influenza Split 08/15/2017, 08/28/2018, 08/24/2019   Influenza,inj,Quad PF,6+ Mos 12/28/2016, 09/28/2017, 09/04/2018, 08/03/2019, 08/29/2020, 08/03/2021, 08/11/2022   Influenza,inj,quad, With Preservative 08/09/2014   Influenza-Unspecified 08/15/2016   Moderna Sars-Covid-2 Vaccination 02/11/2020, 03/10/2020, 07/11/2020   Pneumococcal Polysaccharide-23 02/04/2014, 08/31/2016   Pneumococcal-Unspecified 02/13/2013   Respiratory Syncytial Virus Vaccine,Recomb Aduvanted(Arexvy) 08/11/2022   Tdap 04/25/2013, 02/04/2014   Zoster, Live 05/31/2019, 08/03/2019     Objective: Vital Signs: LMP 08/19/2012     Physical Exam Vitals and nursing note reviewed.  Constitutional:      Appearance: She is well-developed.  HENT:     Head: Normocephalic and atraumatic.  Eyes:     Conjunctiva/sclera: Conjunctivae normal.  Cardiovascular:     Rate and Rhythm: Normal rate and regular rhythm.     Heart sounds: Normal heart sounds.  Pulmonary:     Effort: Pulmonary effort is normal.     Breath sounds: Normal breath sounds.  Abdominal:     General: Bowel sounds are normal.     Palpations: Abdomen is soft.  Musculoskeletal:     Cervical back: Normal range of motion.  Lymphadenopathy:     Cervical: No cervical adenopathy.  Skin:    General: Skin is warm and dry.     Capillary Refill: Capillary refill takes less than 2 seconds.  Neurological:     Mental Status: She is alert and oriented to person, place, and time.  Psychiatric:        Behavior: Behavior normal.      Musculoskeletal Exam: ***  CDAI Exam: CDAI Score: -- Patient Global: --; Provider Global: -- Swollen: --; Tender: -- Joint Exam 02/08/2023   No joint exam has been documented for this visit   There is currently no information documented on the homunculus. Go to the Rheumatology activity and  complete the homunculus joint exam.  Investigation: No additional findings.  Imaging: No results found.  Recent Labs: Lab Results  Component Value Date   WBC 5.2 08/27/2019   HGB 13.3 08/27/2019   PLT 164 08/27/2019   NA 142 08/27/2019   K 2.9 (LL) 08/27/2019   CL 100 08/27/2019   CO2 34 (H) 08/27/2019   GLUCOSE 97 08/27/2019   BUN 12 08/27/2019   CREATININE 1.04 (H) 08/27/2019   BILITOT <0.2 (L) 08/27/2019   ALKPHOS 112 08/27/2019   AST 12 (L) 08/27/2019   ALT 13 08/27/2019   PROT 7.5 08/27/2019   ALBUMIN 3.9 08/27/2019   CALCIUM 10.3 09/23/2020   GFRAA >60 08/27/2019    Speciality Comments: No specialty comments available.  Procedures:  No procedures performed Allergies: Citrus and Lisinopril   Assessment /  Plan:     Visit Diagnoses: Fibromyalgia  Trapezius muscle spasm  Other fatigue  Primary insomnia  Trochanteric bursitis of both hips  Bilateral carpal tunnel syndrome  History of migraine  Pulmonary hypertension (H[facility])  Essential hypertension  History of COPD  Coronary artery disease involving native coronary artery of native heart without angina pectoris  Chronic diastolic (congestive) heart failure ([facility])  MGUS (monoclonal gammopathy of unknown significance)  Former smoker  Orders: No orders of the defined types were placed in this encounter.  No orders of the defined types were placed in this encounter.   Face-to-face time spent with patient was *** minutes. Greater than 50% of time was spent in counseling and coordination of care.  Follow-Up Instructions: No follow-ups on file.   Ofilia Neas, PA-C  Note - This record has been created using Dragon software.  Chart creation errors have been sought, but may not always  have been located. Such creation errors do not reflect on  the standard of medical care.

## 2023-01-26 ENCOUNTER — Encounter: Payer: Self-pay | Admitting: Acute Care

## 2023-01-26 ENCOUNTER — Ambulatory Visit (HOSPITAL_BASED_OUTPATIENT_CLINIC_OR_DEPARTMENT_OTHER): Payer: Medicare Other | Admitting: Physical Therapy

## 2023-01-26 ENCOUNTER — Ambulatory Visit (INDEPENDENT_AMBULATORY_CARE_PROVIDER_SITE_OTHER): Payer: Medicare Other | Admitting: Acute Care

## 2023-01-26 DIAGNOSIS — Z87891 Personal history of nicotine dependence: Secondary | ICD-10-CM | POA: Diagnosis not present

## 2023-01-26 NOTE — Progress Notes (Signed)
Virtual Visit via Telephone Note  I connected with Teresa Roberts on 01/26/23 at 10:30 AM EDT by telephone and verified that I am speaking with the correct person using two identifiers.  Location: Patient:  At home Provider:  [address], [place], [place], [address]    I discussed the limitations, risks, security and privacy concerns of performing an evaluation and management service by telephone and the availability of in person appointments. I also discussed with the patient that there may be a patient responsible charge related to this service. The patient expressed understanding and agreed to proceed.   Shared Decision Making Visit Lung Cancer Screening Program [phone])   Eligibility: Age 62 y.o. Pack Years Smoking History Calculation 22 pack year smoking history (# packs/per year x # years smoked) Recent History of coughing up blood  no Unexplained weight loss? no ( >Than 15 pounds within the last 6 months ) Prior History Lung / other cancer no (Diagnosis within the last 5 years already requiring surveillance chest CT Scans). Smoking Status Former Smoker Former Smokers: Years since quit: < 1 year  Quit Date: 08/13/2022  Visit Components: Discussion included one or more decision making aids. yes Discussion included risk/benefits of screening. yes Discussion included potential follow up diagnostic testing for abnormal scans. yes Discussion included meaning and risk of over diagnosis. yes Discussion included meaning and risk of False Positives. yes Discussion included meaning of total radiation exposure. yes  Counseling Included: Importance of adherence to annual lung cancer LDCT screening. yes Impact of comorbidities on ability to participate in the program. yes Ability and willingness to under diagnostic treatment. yes  Smoking Cessation Counseling: Current Smokers:  Discussed importance of smoking cessation. yes Information about tobacco cessation classes and  interventions provided to patient. yes Patient provided with "ticket" for LDCT Scan. yes Symptomatic Patient. no  Counseling NA Diagnosis Code: Tobacco Use Z72.0 Asymptomatic Patient yes  Counseling (Intermediate counseling: > three minutes counseling) [id number] Former Smokers:  Discussed the importance of maintaining cigarette abstinence. yes Diagnosis Code: Personal History of Nicotine Dependence. [id number] Information about tobacco cessation classes and interventions provided to patient. Yes Patient provided with "ticket" for LDCT Scan. yes Written Order for Lung Cancer Screening with LDCT placed in Epic. Yes (CT Chest Lung Cancer Screening Low Dose W/O CM) [id number] Z12.2-Screening of respiratory organs Z87.891-Personal history of nicotine dependence  I spent 25 minutes of face to face time/virtual visit time  with  Ms. Walbert discussing the risks and benefits of lung cancer screening. We took the time to pause the power point at intervals to allow for questions to be asked and answered to ensure understanding. We discussed that she had taken the single most powerful action possible to decrease her risk of developing lung cancer when she quit smoking. I counseled her to remain smoke free, and to contact me if she ever had the desire to smoke again so that I can provide resources and tools to help support the effort to remain smoke free. We discussed the time and location of the scan, and that either  Doroteo Glassman RN, Joella Prince, RN or I  or I will call / send a letter with the results within  24-72 hours of receiving them. She has the office contact information in the event she needs to speak with me,  she verbalized understanding of all of the above and had no further questions upon leaving the office.     I explained to the patient that there has  been a high incidence of coronary artery disease noted on these exams. I explained that this is a non-gated exam therefore degree or severity cannot  be determined. This patient is on statin therapy. I have asked the patient to follow-up with their PCP regarding any incidental finding of coronary artery disease and management with diet or medication as they feel is clinically indicated. The patient verbalized understanding of the above and had no further questions.     Magdalen Spatz, NP 01/26/2023

## 2023-01-26 NOTE — Patient Instructions (Signed)
Thank you for participating in the  Lung Cancer Screening Program. It was our pleasure to meet you today. We will call you with the results of your scan within the next few days. Your scan will be assigned a Lung RADS category score by the physicians reading the scans.  This Lung RADS score determines follow up scanning.  See below for description of categories, and follow up screening recommendations. We will be in touch to schedule your follow up screening annually or based on recommendations of our providers. We will fax a copy of your scan results to your Primary Care Physician, or the physician who referred you to the program, to ensure they have the results. Please call the office if you have any questions or concerns regarding your scanning experience or results.  Our office number is [phone]. Please speak with Denise Phelps, RN. , or  Denise Buckner RN, They are  our Lung Cancer Screening RN.'s If They are unavailable when you call, Please leave a message on the voice mail. We will return your call at our earliest convenience.This voice mail is monitored several times a day.  Remember, if your scan is normal, we will scan you annually as long as you continue to meet the criteria for the program. (Age 50-80, Current smoker or smoker who has quit within the last 15 years). If you are a smoker, remember, quitting is the single most powerful action that you can take to decrease your risk of lung cancer and other pulmonary, breathing related problems. We know quitting is hard, and we are here to help.  Please let us know if there is anything we can do to help you meet your goal of quitting. If you are a former smoker, congratulations. We are proud of you! Remain smoke free! Remember you can refer friends or family members through the number above.  We will screen them to make sure they meet criteria for the program. Thank you for helping us take better care of you by  participating in Lung Screening.  You can receive free nicotine replacement therapy ( patches, gum or mints) by calling 1-800-QUIT NOW. Please call so we can get you on the path to becoming  a non-smoker. I know it is hard, but you can do this!  Lung RADS Categories:  Lung RADS 1: no nodules or definitely non-concerning nodules.  Recommendation is for a repeat annual scan in 12 months.  Lung RADS 2:  nodules that are non-concerning in appearance and behavior with a very low likelihood of becoming an active cancer. Recommendation is for a repeat annual scan in 12 months.  Lung RADS 3: nodules that are probably non-concerning , includes nodules with a low likelihood of becoming an active cancer.  Recommendation is for a 6-month repeat screening scan. Often noted after an upper respiratory illness. We will be in touch to make sure you have no questions, and to schedule your 6-month scan.  Lung RADS 4 A: nodules with concerning findings, recommendation is most often for a follow up scan in 3 months or additional testing based on our provider's assessment of the scan. We will be in touch to make sure you have no questions and to schedule the recommended 3 month follow up scan.  Lung RADS 4 B:  indicates findings that are concerning. We will be in touch with you to schedule additional diagnostic testing based on our provider's  assessment of the scan.  Other options for assistance in smoking cessation (   As covered by your insurance benefits)  Hypnosis for smoking cessation  [facility] [employer] [phone]  Acupuncture for smoking cessation  [facility] [phone]   

## 2023-01-27 ENCOUNTER — Ambulatory Visit
Admission: RE | Admit: 2023-01-27 | Discharge: 2023-01-27 | Disposition: A | Discharge status: Home / Self Care | Payer: Medicare Other | Source: Ambulatory Visit | Attending: Acute Care | Admitting: Acute Care

## 2023-01-27 DIAGNOSIS — Z122 Encounter for screening for malignant neoplasm of respiratory organs: Secondary | ICD-10-CM

## 2023-01-27 DIAGNOSIS — Z87891 Personal history of nicotine dependence: Secondary | ICD-10-CM

## 2023-02-01 ENCOUNTER — Other Ambulatory Visit: Payer: Self-pay

## 2023-02-01 DIAGNOSIS — Z87891 Personal history of nicotine dependence: Secondary | ICD-10-CM

## 2023-02-01 DIAGNOSIS — Z122 Encounter for screening for malignant neoplasm of respiratory organs: Secondary | ICD-10-CM

## 2023-02-02 ENCOUNTER — Ambulatory Visit (HOSPITAL_BASED_OUTPATIENT_CLINIC_OR_DEPARTMENT_OTHER): Payer: Medicare Other | Admitting: Physical Therapy

## 2023-02-04 DIAGNOSIS — R062 Wheezing: Secondary | ICD-10-CM | POA: Diagnosis not present

## 2023-02-04 DIAGNOSIS — J441 Chronic obstructive pulmonary disease with (acute) exacerbation: Secondary | ICD-10-CM | POA: Diagnosis not present

## 2023-02-04 DIAGNOSIS — J9611 Chronic respiratory failure with hypoxia: Secondary | ICD-10-CM | POA: Diagnosis not present

## 2023-02-04 DIAGNOSIS — J449 Chronic obstructive pulmonary disease, unspecified: Secondary | ICD-10-CM | POA: Diagnosis not present

## 2023-02-08 ENCOUNTER — Encounter: Payer: Self-pay | Admitting: Physician Assistant

## 2023-02-08 ENCOUNTER — Encounter (HOSPITAL_COMMUNITY): Payer: Self-pay | Admitting: Emergency Medicine

## 2023-02-08 ENCOUNTER — Ambulatory Visit: Payer: Medicare Other | Admitting: Physician Assistant

## 2023-02-08 ENCOUNTER — Telehealth: Payer: Self-pay | Admitting: Physician Assistant

## 2023-02-08 ENCOUNTER — Ambulatory Visit (HOSPITAL_COMMUNITY)
Admission: EM | Admit: 2023-02-08 | Discharge: 2023-02-08 | Disposition: A | Discharge status: Home / Self Care | Payer: Medicare Other

## 2023-02-08 DIAGNOSIS — Z8709 Personal history of other diseases of the respiratory system: Secondary | ICD-10-CM

## 2023-02-08 DIAGNOSIS — M62838 Other muscle spasm: Secondary | ICD-10-CM

## 2023-02-08 DIAGNOSIS — F411 Generalized anxiety disorder: Secondary | ICD-10-CM

## 2023-02-08 DIAGNOSIS — F5101 Primary insomnia: Secondary | ICD-10-CM

## 2023-02-08 DIAGNOSIS — Z87891 Personal history of nicotine dependence: Secondary | ICD-10-CM

## 2023-02-08 DIAGNOSIS — Z8669 Personal history of other diseases of the nervous system and sense organs: Secondary | ICD-10-CM

## 2023-02-08 DIAGNOSIS — D472 Monoclonal gammopathy: Secondary | ICD-10-CM

## 2023-02-08 DIAGNOSIS — F419 Anxiety disorder, unspecified: Secondary | ICD-10-CM

## 2023-02-08 DIAGNOSIS — I5032 Chronic diastolic (congestive) heart failure: Secondary | ICD-10-CM

## 2023-02-08 DIAGNOSIS — I2510 Atherosclerotic heart disease of native coronary artery without angina pectoris: Secondary | ICD-10-CM

## 2023-02-08 DIAGNOSIS — M7061 Trochanteric bursitis, right hip: Secondary | ICD-10-CM

## 2023-02-08 DIAGNOSIS — G5603 Carpal tunnel syndrome, bilateral upper limbs: Secondary | ICD-10-CM

## 2023-02-08 DIAGNOSIS — I10 Essential (primary) hypertension: Secondary | ICD-10-CM

## 2023-02-08 DIAGNOSIS — M797 Fibromyalgia: Secondary | ICD-10-CM

## 2023-02-08 DIAGNOSIS — I272 Pulmonary hypertension, unspecified: Secondary | ICD-10-CM

## 2023-02-08 DIAGNOSIS — R5383 Other fatigue: Secondary | ICD-10-CM

## 2023-02-08 NOTE — Telephone Encounter (Signed)
Patient reports that at 9 AM this morning she woke up feeling lightheaded and dizzy.  No syncopal event or fall.  She states that she noticed involuntary muscle spasms in her face which have since resolved.  She denies any headache, shortness of breath, or chest pain at that time. No numbness or weakness.  She has not yet eaten today.  She drove herself to her appointment today in hopes that she could have trapezius trigger point injections.  She took her prescription medications as prescribed this morning. She states that she is feeling anxious and like she may have a "nervous breakdown."  She denies any identifiable trigger for the anxiety.  She states she has not been under any increased stress.  She states she slept well last night. History of migraines-denies any headache or vision changes currently.  She states that her husband is currently in [place] but she has called him to come back home. Her blood pressure was 132/84 today in the office. Blood glucose level was 94 when checked today in the office. Her hands appeared shaky today.  She was tearful upon questioning.   Discouraged a cortisone injection today until she has had further evaluation of the symptoms she is experiencing.  Plan to cancel her office visit today and reschedule the trigger point injections in the future once she is feeling better and has been cleared.    Patient was advised to go to urgent care or the emergency department for further evaluation.  She has declined to go to either location.  We called her PCPs office to see if they could evaluate her today but unfortunately they only have 2 providers in the office who are fully booked today.  Her PCPs office recommended going to the walk-in clinic or being evaluated at her PCPs office at 8:00 in the morning.  Patient was strongly encouraged to go to urgent care or the ED for further evaluation today.  Hazel Sams, PA-C

## 2023-02-08 NOTE — ED Triage Notes (Signed)
Pt reports she woke up around 9am today with facial muscle spasms that last about an hour. Reports hands are shaking and "feel nervous". Pt drover herself to her rheumatoid appt at 1p to get injection for her neck but since she was not herself they advised her to go to [facility] or ED to be seen. Pt reports that her CBG at their office was 94.

## 2023-02-08 NOTE — ED Provider Notes (Signed)
[place]    CSN: [id number] Arrival date & time: 02/08/23  1346      History   Chief Complaint Chief Complaint  Patient presents with  . Spasms  . Shaking    HPI Teresa Roberts is a 62 y.o. female.   HPI  Past Medical History:  Diagnosis Date  . CAD (coronary artery disease)    a. 01/2017: cath showing 20% mid RCA stenosis and otherwise normal LAD and LCx with a preserved EF of 55 to 60%; mod pulmonary HTN  . Carpal tunnel syndrome   . COPD (chronic obstructive pulmonary disease) ([place])    O2 dependent  . cpap   . Depression    PTSD  . Diastolic CHF ([place])   . Fibromyalgia   . Hyperlipidemia   . Hypertension   . Migraines   . Osteoporosis   . Sciatic nerve disease     Patient Active Problem List   Diagnosis Date Noted  . Sciatica of left side associated with disorder of lumbar spine 09/13/2022  . Chronic pain syndrome 09/13/2022  . Encounter for therapeutic drug monitoring 09/13/2022  . Personal history of colonic polyps 01/01/2021  . Esophageal reflux 01/01/2021  . Hypercalcemia 09/23/2020  . Hyperparathyroidism, primary ([place]) 08/25/2020  . Iron deficiency anemia due to chronic blood loss 06/27/2019  . Coronary artery disease involving native coronary artery of native heart without angina pectoris 09/05/2018  . Syncope and collapse 09/05/2018  . Pain in joint of left elbow 09/04/2018  . Chest pain 05/27/2018  . HLD (hyperlipidemia) 05/27/2018  . Essential hypertension 05/27/2018  . Depression 05/27/2018  . OSA (obstructive sleep apnea) 05/03/2018  . Pulmonary hypertension ([place]) 05/03/2018  . Bimalleolar fracture of left ankle 08/12/2017  . Acute renal failure (ARF) ([place]) 08/09/2017  . Chronic diastolic (congestive) heart failure ([place]) 08/09/2017  . Other fatigue 06/13/2017  . Primary insomnia 06/13/2017  . History of migraine 06/13/2017  . COPD (chronic obstructive pulmonary disease) ([place]) 02/15/2017  . Tobacco abuse 02/15/2017  .  Respiratory failure with hypoxia ([place]) 01/28/2017  . Migraine 01/28/2017  . Daily headache   . Hypoxia   . Non-ST elevation myocardial infarction (NSTEMI), type 2 01/26/2017  . MGUS (monoclonal gammopathy of unknown significance) 01/11/2012  . Fibromyalgia 01/11/2012  . Bruises easily 01/11/2012    Past Surgical History:  Procedure Laterality Date  . APPENDECTOMY    . BIOPSY  01/01/2021   Procedure: BIOPSY;  Surgeon: Wilford Corner, MD;  Location: [facility];  Service: Endoscopy;;  . CESAREAN SECTION    . COLONOSCOPY WITH PROPOFOL N/A 01/01/2021   Procedure: COLONOSCOPY WITH PROPOFOL;  Surgeon: Wilford Corner, MD;  Location: [facility];  Service: Endoscopy;  Laterality: N/A;  . ESOPHAGOGASTRODUODENOSCOPY (EGD) WITH PROPOFOL N/A 01/01/2021   Procedure: ESOPHAGOGASTRODUODENOSCOPY (EGD) WITH PROPOFOL;  Surgeon: Wilford Corner, MD;  Location: [facility];  Service: Endoscopy;  Laterality: N/A;  . ORIF ANKLE FRACTURE Left 08/12/2017   Procedure: OPEN REDUCTION INTERNAL FIXATION (ORIF) ANKLE FRACTURE;  Surgeon: Rod Can, MD;  Location: [facility];  Service: Orthopedics;  Laterality: Left;  . RIGHT HEART CATH N/A 07/19/2018   Procedure: RIGHT HEART CATH;  Surgeon: Jolaine Artist, MD;  Location: [facility];  Service: Cardiovascular;  Laterality: N/A;  . RIGHT/LEFT HEART CATH AND CORONARY ANGIOGRAPHY N/A 01/27/2017   Procedure: Right/Left Heart Cath and Coronary Angiography;  Surgeon: Troy Sine, MD;  Location: [facility];  Service: Cardiovascular;  Laterality: N/A;    OB History  No obstetric history on file.      Home Medications    Prior to Admission medications   Medication Sig Start Date End Date Taking? Authorizing Provider  albuterol (PROVENTIL) (2.5 MG/3ML) 0.083% nebulizer solution Take 3 mLs (2.5 mg total) by nebulization every 6 (six) hours as needed for wheezing or shortness of breath. 12/13/22   Parrett, Fonnie Mu, NP  albuterol (VENTOLIN  HFA) 108 (90 Base) MCG/ACT inhaler Inhale 2 puffs into the lungs every 6 (six) hours as needed for wheezing or shortness of breath. 12/13/22   Parrett, Fonnie Mu, NP  aspirin 81 MG EC tablet Take 81 mg by mouth daily after lunch. 05/11/18   [provider]  busPIRone (BUSPAR) 10 MG tablet Take 10 mg by mouth 2 (two) times daily. 01/06/19   [provider]  dorzolamide-timolol (COSOPT) 22.3-6.8 MG/ML ophthalmic solution Place 1 drop into both eyes 2 (two) times daily. Patient not taking: Reported on 02/08/2023    [provider]  DULoxetine (CYMBALTA) 30 MG capsule Take 1 capsule (30 mg total) by mouth 2 (two) times daily. 12/08/22   Gertie Gowda, DO  esomeprazole (NEXIUM) 40 MG capsule Take 40 mg by mouth as needed.    [provider]  Fluticasone-Umeclidin-Vilant (TRELEGY ELLIPTA) 100-62.5-25 MCG/ACT AEPB Inhale 1 puff into the lungs daily. 08/11/22   Rigoberto Noel, MD  folic acid (FOLVITE) 1 MG tablet Take 1 mg by mouth daily after lunch. Patient not taking: Reported on 12/13/2022 05/11/18   [provider]  furosemide (LASIX) 40 MG tablet Take 1 tablet (40 mg total) by mouth daily. 12/02/22 12/02/23  Loel Dubonnet, NP  gabapentin (NEURONTIN) 800 MG tablet Take by mouth.    [provider]  latanoprost (XALATAN) 0.005 % ophthalmic solution Place 1 drop into both eyes at bedtime.    [provider]  metoprolol succinate (TOPROL XL) 25 MG 24 hr tablet Take 1 tablet (25 mg total) by mouth daily. 12/02/22   Loel Dubonnet, NP  omeprazole (PRILOSEC) 20 MG capsule Take 20 mg by mouth as needed. Patient not taking: Reported on 02/08/2023 06/01/18   [provider]  OXYGEN Inhale 4 L into the lungs continuous.    [provider]  pregabalin (LYRICA) 75 MG capsule Start with Lyrica 75 mg 1 tablet twice daily while cutting your doses of gabapentin in half. Continue this for 1 week, then start Lyrica 2 tablets twice per day, and  stop gabapentin. If any side effects or concerns, stop lyrica and return to gabapentin at prior dose 12/08/22   Durel Salts C, DO  rizatriptan (MAXALT-MLT) 10 MG disintegrating tablet Take 10 mg by mouth as needed for migraine. May repeat in 2 hours if needed    [provider]  SAXENDA 18 MG/3ML SOPN  07/09/19   [provider]  simvastatin (ZOCOR) 10 MG tablet Take 10 mg by mouth at bedtime.    [provider]  tiZANidine (ZANAFLEX) 4 MG tablet TAKE 1 TABLET BY MOUTH AT BEDTIME AS NEEDED FOR MUSCLE SPASMS. 11/26/22   Ofilia Neas, PA-C  topiramate (TOPAMAX) 25 MG tablet Take 50 mg by mouth as needed.  Patient not taking: Reported on 02/08/2023    [provider]  traZODone (DESYREL) 100 MG tablet Take 100 mg by mouth at bedtime.    [provider]  vitamin B-12 (CYANOCOBALAMIN) 1000 MCG tablet Take 1,000 mcg by mouth daily after lunch.    [provider]  Vitamin D, Ergocalciferol, (DRISDOL) 1.25 MG (50000 UNIT) CAPS capsule Take 50,000 Units by mouth once a week. 11/20/22   [provider]    Family History Family History  Problem Relation Age of Onset  . Diabetes Mother   . Hypertension Mother   . CAD Mother   . Diabetes Father   . Lung cancer Father   . Diabetes Sister   . Hypercalcemia Neg Hx   . Breast cancer Neg Hx     Social History Social History   Tobacco Use  . Smoking status: Former    Packs/day: 0.50    Years: 43.00    Additional pack years: 0.00    Total pack years: 21.50    Types: Cigarettes    Start date: 59    Quit date: 08/13/2022    Years since quitting: 0.4  . Smokeless tobacco: Never  Vaping Use  . Vaping Use: Never used  Substance Use Topics  . Alcohol use: No  . Drug use: Not Currently    Types: Marijuana     Allergies   Citrus and Lisinopril   Review of Systems Review of Systems   Physical Exam Triage Vital Signs ED Triage Vitals [02/08/23 1436]  Enc Vitals Group     BP  (!) 156/93     Pulse Rate 82     Resp 20     Temp 98 F (36.7 C)     Temp Source Oral     SpO2 99 %     Weight      Height      Head Circumference      Peak Flow      Pain Score      Pain Loc      Pain Edu?      Excl. in [place]?    No data found.  Updated Vital Signs BP (!) 156/93 (BP Location: Right Arm)   Pulse 82   Temp 98 F (36.7 C) (Oral)   Resp 20   LMP 08/19/2012   SpO2 99%   Visual Acuity Right Eye Distance:   Left Eye Distance:   Bilateral Distance:    Right Eye Near:   Left Eye Near:    Bilateral Near:     Physical Exam   UC Treatments / Results  Labs (all labs ordered are listed, but only abnormal results are displayed) Labs Reviewed - No data to display  EKG   Radiology No results found.  Procedures Procedures (including critical care time)  Medications Ordered in UC Medications - No data to display  Initial Impression / Assessment and Plan / UC Course  I have reviewed the triage vital signs and the nursing notes.  Pertinent labs & imaging results that were available during my care of the patient were reviewed by me and considered in my medical decision making (see chart for details).     *** Final Clinical Impressions(s) / UC Diagnoses   Final diagnoses:  None   Discharge Instructions   None    ED Prescriptions   None    PDMP not reviewed this encounter.

## 2023-02-08 NOTE — Discharge Instructions (Addendum)
I am so glad that you are feeling better. I would like for you to go home, get some really good food, and continue to rest and relax. If your symptoms return, I would like for you to go to [facility] urgent care.  They are open 24-7.  Otherwise, please call your primary care provider and schedule an appointment for this week to follow-up and for adjustment of your anxiety and depression medications.  If you develop any new or worsening symptoms or do not improve in the next 2 to 3 days, please return.  If your symptoms are severe, please go to the emergency room.  Follow-up with your primary care provider for further evaluation and management of your symptoms as well as ongoing wellness visits.  I hope you feel better!

## 2023-02-09 ENCOUNTER — Telehealth: Payer: Self-pay | Admitting: Physician Assistant

## 2023-02-09 ENCOUNTER — Ambulatory Visit (HOSPITAL_BASED_OUTPATIENT_CLINIC_OR_DEPARTMENT_OTHER): Payer: Medicare Other | Admitting: Physical Therapy

## 2023-02-09 NOTE — Progress Notes (Signed)
Appt was canceled

## 2023-02-09 NOTE — Telephone Encounter (Signed)
I called the patient to see how she was feeling today.  Patient reports that she was evaluated at urgent care and was felt to be having an anxiety attack.  She is feeling much better today.  Plan to schedule trapezius trigger point injections since she is feeling better.    Hazel Sams, PA-C

## 2023-02-10 ENCOUNTER — Encounter: Payer: Self-pay | Admitting: Rheumatology

## 2023-02-10 ENCOUNTER — Ambulatory Visit (INDEPENDENT_AMBULATORY_CARE_PROVIDER_SITE_OTHER): Payer: Medicare Other | Admitting: Rheumatology

## 2023-02-10 VITALS — BP 100/62 | HR 66 | Resp 18 | Ht 66.0 in | Wt 181.0 lb

## 2023-02-10 DIAGNOSIS — Z87891 Personal history of nicotine dependence: Secondary | ICD-10-CM

## 2023-02-10 DIAGNOSIS — G5603 Carpal tunnel syndrome, bilateral upper limbs: Secondary | ICD-10-CM

## 2023-02-10 DIAGNOSIS — D472 Monoclonal gammopathy: Secondary | ICD-10-CM

## 2023-02-10 DIAGNOSIS — Z8709 Personal history of other diseases of the respiratory system: Secondary | ICD-10-CM | POA: Diagnosis not present

## 2023-02-10 DIAGNOSIS — I2510 Atherosclerotic heart disease of native coronary artery without angina pectoris: Secondary | ICD-10-CM

## 2023-02-10 DIAGNOSIS — I5032 Chronic diastolic (congestive) heart failure: Secondary | ICD-10-CM

## 2023-02-10 DIAGNOSIS — R5383 Other fatigue: Secondary | ICD-10-CM | POA: Diagnosis not present

## 2023-02-10 DIAGNOSIS — M545 Low back pain, unspecified: Secondary | ICD-10-CM | POA: Diagnosis not present

## 2023-02-10 DIAGNOSIS — F419 Anxiety disorder, unspecified: Secondary | ICD-10-CM

## 2023-02-10 DIAGNOSIS — M62838 Other muscle spasm: Secondary | ICD-10-CM

## 2023-02-10 DIAGNOSIS — F5101 Primary insomnia: Secondary | ICD-10-CM

## 2023-02-10 DIAGNOSIS — M7061 Trochanteric bursitis, right hip: Secondary | ICD-10-CM

## 2023-02-10 DIAGNOSIS — G8929 Other chronic pain: Secondary | ICD-10-CM

## 2023-02-10 DIAGNOSIS — I272 Pulmonary hypertension, unspecified: Secondary | ICD-10-CM | POA: Diagnosis not present

## 2023-02-10 DIAGNOSIS — I10 Essential (primary) hypertension: Secondary | ICD-10-CM | POA: Diagnosis not present

## 2023-02-10 DIAGNOSIS — M797 Fibromyalgia: Secondary | ICD-10-CM

## 2023-02-10 DIAGNOSIS — Z8669 Personal history of other diseases of the nervous system and sense organs: Secondary | ICD-10-CM

## 2023-02-10 DIAGNOSIS — M7062 Trochanteric bursitis, left hip: Secondary | ICD-10-CM

## 2023-02-10 MED ORDER — TRIAMCINOLONE ACETONIDE 40 MG/ML IJ SUSP
10.0000 mg | INTRAMUSCULAR | Status: AC | PRN
  Administered 2023-02-10: 10 mg via INTRAMUSCULAR

## 2023-02-10 MED ORDER — LIDOCAINE HCL 1 % IJ SOLN
0.5000 mL | INTRAMUSCULAR | Status: AC | PRN
  Administered 2023-02-10: .5 mL

## 2023-02-10 NOTE — Progress Notes (Signed)
Office Visit Note  Patient: Teresa Roberts             Date of Birth: 1961-04-02           MRN: [id number]             PCP: Jonathon Jordan, MD Referring: Jonathon Jordan, MD Visit Date: 02/10/2023 Occupation: @GUAROCC @  Subjective:  Neck pain  History of Present Illness: Teresa Roberts is a 62 y.o. female with history of fibromyalgia and osteoarthritis.  She states she came here a couple of days ago for trigger point injections but she could not get it because of panic attack.  She was seen at urgent care and she was advised to relax and rest.  She states the symptoms resolved by the time she went to the urgent care.  She continues to take Cymbalta 60 mg p.o. daily and tizanidine 4 mg p.o. nightly.  She wants to have trigger point injections today in the trapezius area.  She continues to have some discomfort in her bilateral trochanteric region.  She has been going to pain management which has been helpful.  She is on oxygen 5 L per nasal cannula.  She states her [place] symptoms are getting worse.  She has not been using carpal tunnel braces.    Activities of Daily Living:  Patient reports morning stiffness for 30 minutes.   Patient Reports nocturnal pain.  Difficulty dressing/grooming: Reports Difficulty climbing stairs: Reports Difficulty getting out of chair: Denies Difficulty using hands for taps, buttons, cutlery, and/or writing: Reports  Review of Systems  Constitutional:  Positive for fatigue.  HENT:  Positive for mouth dryness. Negative for mouth sores.   Eyes:  Negative for dryness.  Respiratory:  Positive for shortness of breath.   Cardiovascular:  Negative for chest pain and palpitations.  Gastrointestinal:  Positive for blood in stool. Negative for constipation and diarrhea.  Endocrine: Negative for increased urination.  Genitourinary:  Negative for involuntary urination.  Musculoskeletal:  Positive for joint pain, gait problem, joint pain, myalgias, muscle weakness, morning  stiffness, muscle tenderness and myalgias. Negative for joint swelling.  Skin:  Negative for color change, rash, hair loss and sensitivity to sunlight.  Allergic/Immunologic: Negative for susceptible to infections.  Neurological:  Positive for dizziness and headaches.  Hematological:  Negative for swollen glands.  Psychiatric/Behavioral:  Positive for depressed mood and sleep disturbance. The patient is nervous/anxious.     PMFS History:  Patient Active Problem List   Diagnosis Date Noted   Sciatica of left side associated with disorder of lumbar spine 09/13/2022   Chronic pain syndrome 09/13/2022   Encounter for therapeutic drug monitoring 09/13/2022   Personal history of colonic polyps 01/01/2021   Esophageal reflux 01/01/2021   Hypercalcemia 09/23/2020   Hyperparathyroidism, primary ([place]) 08/25/2020   Iron deficiency anemia due to chronic blood loss 06/27/2019   Coronary artery disease involving native coronary artery of native heart without angina pectoris 09/05/2018   Syncope and collapse 09/05/2018   Pain in joint of left elbow 09/04/2018   Chest pain 05/27/2018   HLD (hyperlipidemia) 05/27/2018   Essential hypertension 05/27/2018   Depression 05/27/2018   OSA (obstructive sleep apnea) 05/03/2018   Pulmonary hypertension ([place]) 05/03/2018   Bimalleolar fracture of left ankle 08/12/2017   Acute renal failure (ARF) ([place]) 08/09/2017   Chronic diastolic (congestive) heart failure ([place]) 08/09/2017   Other fatigue 06/13/2017   Primary insomnia 06/13/2017   History of migraine 06/13/2017   COPD (chronic obstructive pulmonary disease) (  [place]) 02/15/2017   Tobacco abuse 02/15/2017   Respiratory failure with hypoxia ([place]) 01/28/2017   Migraine 01/28/2017   Daily headache    Hypoxia    Non-ST elevation myocardial infarction (NSTEMI), type 2 01/26/2017   MGUS (monoclonal gammopathy of unknown significance) 01/11/2012   Fibromyalgia 01/11/2012   Bruises easily 01/11/2012    Past  Medical History:  Diagnosis Date   CAD (coronary artery disease)    a. 01/2017: cath showing 20% mid RCA stenosis and otherwise normal LAD and LCx with a preserved EF of 55 to 60%; mod pulmonary HTN   Carpal tunnel syndrome    COPD (chronic obstructive pulmonary disease) (H[facility])    O2 dependent   cpap    Depression    PTSD   Diastolic CHF ([place])    Fibromyalgia    Hyperlipidemia    Hypertension    Migraines    Osteoporosis    Sciatic nerve disease     Family History  Problem Relation Age of Onset   Diabetes Mother    Hypertension Mother    CAD Mother    Diabetes Father    Lung cancer Father    Diabetes Sister    Hypercalcemia Neg Hx    Breast cancer Neg Hx    Past Surgical History:  Procedure Laterality Date   APPENDECTOMY     BIOPSY  01/01/2021   Procedure: BIOPSY;  Surgeon: Wilford Corner, MD;  Location: [facility];  Service: Endoscopy;;   CESAREAN SECTION     COLONOSCOPY WITH PROPOFOL N/A 01/01/2021   Procedure: COLONOSCOPY WITH PROPOFOL;  Surgeon: Wilford Corner, MD;  Location: [facility];  Service: Endoscopy;  Laterality: N/A;   ESOPHAGOGASTRODUODENOSCOPY (EGD) WITH PROPOFOL N/A 01/01/2021   Procedure: ESOPHAGOGASTRODUODENOSCOPY (EGD) WITH PROPOFOL;  Surgeon: Wilford Corner, MD;  Location: [facility];  Service: Endoscopy;  Laterality: N/A;   ORIF ANKLE FRACTURE Left 08/12/2017   Procedure: OPEN REDUCTION INTERNAL FIXATION (ORIF) ANKLE FRACTURE;  Surgeon: Rod Can, MD;  Location: [facility];  Service: Orthopedics;  Laterality: Left;   RIGHT HEART CATH N/A 07/19/2018   Procedure: RIGHT HEART CATH;  Surgeon: Jolaine Artist, MD;  Location: [facility];  Service: Cardiovascular;  Laterality: N/A;   RIGHT/LEFT HEART CATH AND CORONARY ANGIOGRAPHY N/A 01/27/2017   Procedure: Right/Left Heart Cath and Coronary Angiography;  Surgeon: Troy Sine, MD;  Location: [facility];  Service: Cardiovascular;  Laterality: N/A;   Social History   Social  History Narrative   Not on file   Immunization History  Administered Date(s) Administered   Influenza Split 08/15/2017, 08/28/2018, 08/24/2019   Influenza,inj,Quad PF,6+ Mos 12/28/2016, 09/28/2017, 09/04/2018, 08/03/2019, 08/29/2020, 08/03/2021, 08/11/2022   Influenza,inj,quad, With Preservative 08/09/2014   Influenza-Unspecified 08/15/2016   Moderna Sars-Covid-2 Vaccination 02/11/2020, 03/10/2020, 07/11/2020   Pneumococcal Polysaccharide-23 02/04/2014, 08/31/2016   Pneumococcal-Unspecified 02/13/2013   Respiratory Syncytial Virus Vaccine,Recomb Aduvanted(Arexvy) 08/11/2022   Tdap 04/25/2013, 02/04/2014   Zoster, Live 05/31/2019, 08/03/2019     Objective: Vital Signs: BP 100/62 (BP Location: Left Arm, Patient Position: Sitting, Cuff Size: Normal)   Pulse 66   Resp 18   Ht 5\' 6"  (1.676 m)   Wt 181 lb (82.1 kg)   LMP 08/19/2012   BMI 29.21 kg/m    Physical Exam Vitals and nursing note reviewed.  Constitutional:      Appearance: She is well-developed.  HENT:     Head: Normocephalic and atraumatic.  Eyes:     Conjunctiva/sclera: Conjunctivae normal.  Cardiovascular:  Rate and Rhythm: Normal rate and regular rhythm.     Heart sounds: Normal heart sounds.  Pulmonary:     Effort: Pulmonary effort is normal.     Breath sounds: Normal breath sounds.  Abdominal:     General: Bowel sounds are normal.     Palpations: Abdomen is soft.  Musculoskeletal:     Cervical back: Normal range of motion.  Lymphadenopathy:     Cervical: No cervical adenopathy.  Skin:    General: Skin is warm and dry.     Capillary Refill: Capillary refill takes less than 2 seconds.  Neurological:     Mental Status: She is alert and oriented to person, place, and time.  Psychiatric:        Behavior: Behavior normal.      Musculoskeletal Exam: She had limited lateral rotation of the cervical spine with tenderness over bilateral trapezius region.  She had difficulty raising her arms due to trapezius  discomfort.  Elbow joints, wrist joints, MCPs PIPs and DIPs been good range of motion with no synovitis.  She had tenderness over trochanteric region.  Knee joints and ankle joints were in good range of motion.  There was no tenderness over ankles or MTPs.  CDAI Exam: CDAI Score: -- Patient Global: --; Provider Global: -- Swollen: --; Tender: -- Joint Exam 02/10/2023   No joint exam has been documented for this visit   There is currently no information documented on the homunculus. Go to the Rheumatology activity and complete the homunculus joint exam.  Investigation: No additional findings.  Imaging: CT CHEST LUNG CA SCREEN LOW DOSE W/O CM  Result Date: 01/31/2023 CLINICAL DATA:  62 year old female former smoker (quit 1 year ago) with 22 pack-year history of smoking. Lung cancer screening examination. EXAM: CT CHEST WITHOUT CONTRAST LOW-DOSE FOR LUNG CANCER SCREENING TECHNIQUE: Multidetector CT imaging of the chest was performed following the standard protocol without IV contrast. RADIATION DOSE REDUCTION: This exam was performed according to the departmental dose-optimization program which includes automated exposure control, adjustment of the mA and/or kV according to patient size and/or use of iterative reconstruction technique. COMPARISON:  Low-dose lung cancer screening chest CT 09/02/2021. FINDINGS: Cardiovascular: Heart size is normal. There is no significant pericardial fluid, thickening or pericardial calcification. There is aortic atherosclerosis, as well as atherosclerosis of the great vessels of the mediastinum and the coronary arteries, including calcified atherosclerotic plaque in the left main, left anterior descending and right coronary arteries. Mediastinum/Nodes: No pathologically enlarged mediastinal or hilar lymph nodes. Please note that accurate exclusion of hilar adenopathy is limited on noncontrast CT scans. Esophagus is unremarkable in appearance. No axillary  lymphadenopathy. Lungs/Pleura: Tiny pulmonary nodule in the superior segment of the right lower lobe (axial image 88), with a volume derived mean diameter of 2.7 mm. No other larger more suspicious appearing pulmonary nodules or masses are noted. No acute consolidative airspace disease. No pleural effusions. Mild diffuse bronchial wall thickening with mild centrilobular and paraseptal emphysema. Widespread areas of scarring are noted in the lung bases bilaterally. Upper Abdomen: Aortic atherosclerosis. Low-attenuation lesions in the kidneys bilaterally, incompletely characterized on today's noncontrast CT examination, but statistically likely to represent cysts (no imaging follow-up recommended). Musculoskeletal: There are no aggressive appearing lytic or blastic lesions noted in the visualized portions of the skeleton. IMPRESSION: 1. Lung-RADS 2S, benign appearance or behavior. Continue annual screening with low-dose chest CT without contrast in 12 months. 2. The "S" modifier above refers to potentially clinically significant non lung cancer  related findings. Specifically, there is aortic atherosclerosis, in addition to left main and 2 vessel coronary artery disease. Please note that although the presence of coronary artery calcium documents the presence of coronary artery disease, the severity of this disease and any potential stenosis cannot be assessed on this non-gated CT examination. Assessment for potential risk factor modification, dietary therapy or pharmacologic therapy may be warranted, if clinically indicated. 3. Mild diffuse bronchial wall thickening with mild centrilobular and paraseptal emphysema; imaging findings suggestive of underlying COPD. Aortic Atherosclerosis (ICD10-I70.0) and Emphysema (ICD10-J43.9). Electronically Signed   By: Vinnie Langton M.D.   On: 01/31/2023 08:58   Recent Labs: Lab Results  Component Value Date   WBC 5.2 08/27/2019   HGB 13.3 08/27/2019   PLT 164 08/27/2019    NA 142 08/27/2019   K 2.9 (LL) 08/27/2019   CL 100 08/27/2019   CO2 34 (H) 08/27/2019   GLUCOSE 97 08/27/2019   BUN 12 08/27/2019   CREATININE 1.04 (H) 08/27/2019   BILITOT <0.2 (L) 08/27/2019   ALKPHOS 112 08/27/2019   AST 12 (L) 08/27/2019   ALT 13 08/27/2019   PROT 7.5 08/27/2019   ALBUMIN 3.9 08/27/2019   CALCIUM 10.3 09/23/2020   GFRAA >60 08/27/2019    Speciality Comments: No specialty comments available.  Procedures:  Trigger Point Inj  Date/Time: 02/10/2023 1:43 PM  Performed by: Bo Merino, MD Authorized by: Bo Merino, MD   Consent Given by:  Patient Site marked: the procedure site was marked   Timeout: prior to procedure the correct patient, procedure, and site was verified   Indications:  Muscle spasm and pain Total # of Trigger Points:  2 Location: neck   Needle Size:  27 G Approach:  Dorsal Medications #1:  0.5 mL lidocaine 1 %; 10 mg triamcinolone acetonide 40 MG/ML Medications #2:  0.5 mL lidocaine 1 %; 10 mg triamcinolone acetonide 40 MG/ML Patient tolerance:  Patient tolerated the procedure well with no immediate complications  Allergies: Citrus and Lisinopril   Assessment / Plan:     Visit Diagnoses: Trapezius muscle spasm-continues to have pain and discomfort in the trapezius region.  She requests cortisone injection today.  After informed consent was obtained and side effects were discussed bilateral trapezius region was injected with lidocaine and Kenalog as described above.  Patient tolerated the procedure well.  Postprocedure instructions were given.  A handout on neck exercises was given.  Fibromyalgia -she continues to have generalized pain and discomfort.  She is going to pain management.  She has noticed improvement in her symptoms.  She is on Cymbalta 30 mg p.o. twice daily, tizanidine 4 mg p.o. nightly.    Other fatigue-she continues to have fatigue.  Primary insomnia -she is on trazodone 100 mg p.o. nightly as needed which has  been helpful.  Bilateral carpal tunnel syndrome - Bilateral mild carpal tunnel syndrome was noted on the nerve conduction velocities and [place] of 2023 by Dr. Ernestina Patches.  Patient states that she continues to have symptoms of carpal tunnel syndrome.  I gave her prescription for bilateral carpal tunnel braces.  Will see her response to using braces at nighttime.  Trochanteric bursitis of both hips-she continues to have tenderness over the trochanteric region.  A handout on IT band stretches was given.  Chronic left-sided low back pain without sciatica - She was referred to pain management.  X-rays were unremarkable in the past.  Pulmonary hypertension ([place]) - Most likely due to COPD, diastolic dysfunction and sleep apnea  History of COPD - 5 L of oxygen.  Essential hypertension-blood pressure was no manage 100/62 today.  Coronary artery disease involving native coronary artery of native heart without angina pectoris  Chronic diastolic (congestive) heart failure (HCC)  MGUS (monoclonal gammopathy of unknown significance)  Anxiety-patient had a panic attack few days ago.  She states the symptoms resolved when she went to the urgent care.  History of migraine  Former smoker  Orders: No orders of the defined types were placed in this encounter.  No orders of the defined types were placed in this encounter.    Follow-Up Instructions: Return in about 6 months (around 08/13/2023) for FMS.   Bo Merino, MD  Note - This record has been created using Editor, commissioning.  Chart creation errors have been sought, but may not always  have been located. Such creation errors do not reflect on  the standard of medical care.

## 2023-02-10 NOTE — Patient Instructions (Signed)
Cervical Strain and Sprain Rehab Ask your health care provider which exercises are safe for you. Do exercises exactly as told by your health care provider and adjust them as directed. It is normal to feel mild stretching, pulling, tightness, or discomfort as you do these exercises. Stop right away if you feel sudden pain or your pain gets worse. Do not begin these exercises until told by your health care provider. Stretching and range-of-motion exercises Cervical side bending  Using good posture, sit on a stable chair or stand up. Without moving your shoulders, slowly tilt your left / right ear to your shoulder until you feel a stretch in the neck muscles on the opposite side. You should be looking straight ahead. Hold for __________ seconds. Repeat with the other side of your neck. Repeat __________ times. Complete this exercise __________ times a day. Cervical rotation  Using good posture, sit on a stable chair or stand up. Slowly turn your head to the side as if you are looking over your left / right shoulder. Keep your eyes level with the ground. Stop when you feel a stretch along the side and the back of your neck. Hold for __________ seconds. Repeat this by turning to your other side. Repeat __________ times. Complete this exercise __________ times a day. Thoracic extension and pectoral stretch  Roll a towel or a small blanket so it is about 4 inches (10 cm) in diameter. Lie down on your back on a firm surface. Put the towel in the middle of your back across your spine. It should not be under your shoulder blades. Put your hands behind your head and let your elbows fall out to your sides. Hold for __________ seconds. Repeat __________ times. Complete this exercise __________ times a day. Strengthening exercises Upper cervical flexion  Lie on your back with a thin pillow behind your head or a small, rolled-up towel under your neck. Gently tuck your chin toward your chest and nod  your head down to look toward your feet. Do not lift your head off the pillow. Hold for __________ seconds. Release the tension slowly. Relax your neck muscles completely before you repeat this exercise. Repeat __________ times. Complete this exercise __________ times a day. Cervical extension  Stand about 6 inches (15 cm) away from a wall, with your back facing the wall. Place a soft object, about 6-8 inches (15-20 cm) in diameter, between the back of your head and the wall. A soft object could be a small pillow, a ball, or a folded towel. Gently tilt your head back and press into the soft object. Keep your jaw and forehead relaxed. Hold for __________ seconds. Release the tension slowly. Relax your neck muscles completely before you repeat this exercise. Repeat __________ times. Complete this exercise __________ times a day. Posture and body mechanics Body mechanics refer to the movements and positions of your body while you do your daily activities. Posture is part of body mechanics. Good posture and healthy body mechanics can help to relieve stress in your body's tissues and joints. Good posture means that your spine is in its natural S-curve position (your spine is neutral), your shoulders are pulled back slightly, and your head is not tipped forward. The following are general guidelines for using improved posture and body mechanics in your everyday activities. Sitting  When sitting, keep your spine neutral and keep your feet flat on the floor. Use a footrest, if needed, and keep your thighs parallel to the floor. Avoid rounding  your shoulders. Avoid tilting your head forward. When working at a desk or a computer, keep your desk at a height where your hands are slightly lower than your elbows. Slide your chair under your desk so you are close enough to maintain good posture. When working at a computer, place your monitor at a height where you are looking straight ahead and you do not have to  tilt your head forward or downward to look at the screen. Standing  When standing, keep your spine neutral and keep your feet about hip-width apart. Keep a slight bend in your knees. Your ears, shoulders, and hips should line up. When you do a task in which you stand in one place for a long time, place one foot up on a stable object that is 2-4 inches (5-10 cm) high, such as a footstool. This helps keep your spine neutral. Resting When lying down and resting, avoid positions that are most painful for you. Try to support your neck in a neutral position. You can use a contour pillow or a small rolled-up towel. Your pillow should support your neck but not push on it. This information is not intended to replace advice given to you by your health care provider. Make sure you discuss any questions you have with your health care provider. Document Revised: 05/24/2022 Document Reviewed: 05/24/2022 Elsevier Patient Education  Wheeler Band Syndrome Rehab Ask your health care provider which exercises are safe for you. Do exercises exactly as told by your health care provider and adjust them as directed. It is normal to feel mild stretching, pulling, tightness, or discomfort as you do these exercises. Stop right away if you feel sudden pain or your pain gets significantly worse. Do not begin these exercises until told by your health care provider. Stretching and range-of-motion exercises These exercises warm up your muscles and joints and improve the movement and flexibility of your hip and pelvis. Quadriceps stretch, prone  Lie on your abdomen (prone position) on a firm surface, such as a bed or padded floor. Bend your left / right knee and reach back to hold your ankle or pant leg. If you cannot reach your ankle or pant leg, loop a belt around your foot and grab the belt instead. Gently pull your heel toward your buttocks. Your knee should not slide out to the side. You should feel a  stretch in the front of your thigh and knee (quadriceps). Hold this position for __________ seconds. Repeat __________ times. Complete this exercise __________ times a day. Iliotibial band stretch An iliotibial band is a strong band of muscle tissue that runs from the outer side of your hip to the outer side of your thigh and knee. Lie on your side with your left / right leg in the top position. Bend both of your knees and grab your left / right ankle. Stretch out your bottom arm to help you balance. Slowly bring your top knee back so your thigh goes behind your trunk. Slowly lower your top leg toward the floor until you feel a gentle stretch on the outside of your left / right hip and thigh. If you do not feel a stretch and your knee will not fall farther, place the heel of your other foot on top of your knee and pull your knee down toward the floor with your foot. Hold this position for __________ seconds. Repeat __________ times. Complete this exercise __________ times a day. Strengthening exercises These exercises build strength  and endurance in your hip and pelvis. Endurance is the ability to use your muscles for a long time, even after they get tired. Straight leg raises, side-lying This exercise strengthens the muscles that rotate the leg at the hip and move it away from your body (hip abductors). Lie on your side with your left / right leg in the top position. Lie so your head, shoulder, hip, and knee line up. You may bend your bottom knee to help you balance. Roll your hips slightly forward so your hips are stacked directly over each other and your left / right knee is facing forward. Tense the muscles in your outer thigh and lift your top leg 4-6 inches (10-15 cm). Hold this position for __________ seconds. Slowly lower your leg to return to the starting position. Let your muscles relax completely before doing another repetition. Repeat __________ times. Complete this exercise  __________ times a day. Leg raises, prone This exercise strengthens the muscles that move the hips backward (hip extensors). Lie on your abdomen (prone position) on your bed or a firm surface. You can put a pillow under your hips if that is more comfortable for your lower back. Bend your left / right knee so your foot is straight up in the air. Squeeze your buttocks muscles and lift your left / right thigh off the bed. Do not let your back arch. Tense your thigh muscle as hard as you can without increasing any knee pain. Hold this position for __________ seconds. Slowly lower your leg to return to the starting position and allow it to relax completely. Repeat __________ times. Complete this exercise __________ times a day. Hip hike Stand sideways on a bottom step. Stand on your left / right leg with your other foot unsupported next to the step. You can hold on to a railing or wall for balance if needed. Keep your knees straight and your torso square. Then lift your left / right hip up toward the ceiling. Slowly let your left / right hip lower toward the floor, past the starting position. Your foot should get closer to the floor. Do not lean or bend your knees. Repeat __________ times. Complete this exercise __________ times a day. This information is not intended to replace advice given to you by your health care provider. Make sure you discuss any questions you have with your health care provider. Document Revised: 01/09/2020 Document Reviewed: 01/09/2020 Elsevier Patient Education  [place].

## 2023-02-14 ENCOUNTER — Ambulatory Visit (HOSPITAL_BASED_OUTPATIENT_CLINIC_OR_DEPARTMENT_OTHER): Payer: Medicare Other | Admitting: Physical Therapy

## 2023-02-20 DIAGNOSIS — J449 Chronic obstructive pulmonary disease, unspecified: Secondary | ICD-10-CM | POA: Diagnosis not present

## 2023-02-20 DIAGNOSIS — G4733 Obstructive sleep apnea (adult) (pediatric): Secondary | ICD-10-CM | POA: Diagnosis not present

## 2023-02-20 DIAGNOSIS — R062 Wheezing: Secondary | ICD-10-CM | POA: Diagnosis not present

## 2023-02-20 DIAGNOSIS — J441 Chronic obstructive pulmonary disease with (acute) exacerbation: Secondary | ICD-10-CM | POA: Diagnosis not present

## 2023-02-20 DIAGNOSIS — J9611 Chronic respiratory failure with hypoxia: Secondary | ICD-10-CM | POA: Diagnosis not present

## 2023-02-23 ENCOUNTER — Ambulatory Visit (HOSPITAL_BASED_OUTPATIENT_CLINIC_OR_DEPARTMENT_OTHER): Payer: Medicare Other | Admitting: Physical Therapy

## 2023-02-24 ENCOUNTER — Other Ambulatory Visit: Payer: Self-pay | Admitting: Physician Assistant

## 2023-02-24 NOTE — Telephone Encounter (Signed)
Last Fill: 11/26/2022  Next Visit: 08/17/2023  Last Visit: 02/10/2023  Dx: Fibromyalgia   Current Dose per office note on 02/10/2023: tizanidine 4 mg p.o. nightly.   Okay to refill Tizanidine?

## 2023-03-02 ENCOUNTER — Ambulatory Visit: Payer: Medicare Other | Attending: Physical Medicine and Rehabilitation

## 2023-03-02 DIAGNOSIS — M542 Cervicalgia: Secondary | ICD-10-CM | POA: Insufficient documentation

## 2023-03-02 DIAGNOSIS — M6281 Muscle weakness (generalized): Secondary | ICD-10-CM | POA: Insufficient documentation

## 2023-03-02 DIAGNOSIS — M545 Low back pain, unspecified: Secondary | ICD-10-CM | POA: Insufficient documentation

## 2023-03-02 DIAGNOSIS — R262 Difficulty in walking, not elsewhere classified: Secondary | ICD-10-CM | POA: Insufficient documentation

## 2023-03-02 NOTE — Therapy (Signed)
[place] [facility] [address] [place], [place], [address] Phone: [phone]   Fax:  [phone]  Patient Details  Name: Teresa Roberts MRN: [id number] Date of Birth: December 30, 1960 Referring Provider:  Mila Palmer, MD  Encounter Date: 03/02/2023   PHYSICAL THERAPY DISCHARGE SUMMARY  Visits from Start of Care: 1  Current functional level related to goals / functional outcomes: Evaluation measures   Remaining deficits: N/A   Education / Equipment: HEP initiated   Patient agrees to discharge. Patient goals were not met. Patient is being discharged due to not returning since the last visit.   Dion Body, PT 03/02/2023, 3:21 PM  [facility] [address] [place], [place], [address] Phone: [phone]   Fax:  [phone]

## 2023-03-07 DIAGNOSIS — J449 Chronic obstructive pulmonary disease, unspecified: Secondary | ICD-10-CM | POA: Diagnosis not present

## 2023-03-07 DIAGNOSIS — J441 Chronic obstructive pulmonary disease with (acute) exacerbation: Secondary | ICD-10-CM | POA: Diagnosis not present

## 2023-03-07 DIAGNOSIS — R062 Wheezing: Secondary | ICD-10-CM | POA: Diagnosis not present

## 2023-03-07 DIAGNOSIS — J9611 Chronic respiratory failure with hypoxia: Secondary | ICD-10-CM | POA: Diagnosis not present

## 2023-03-09 ENCOUNTER — Encounter: Payer: Self-pay | Admitting: Physical Medicine and Rehabilitation

## 2023-03-09 ENCOUNTER — Encounter
Payer: Medicare Other | Attending: Physical Medicine and Rehabilitation | Admitting: Physical Medicine and Rehabilitation

## 2023-03-09 VITALS — BP 101/70 | HR 62 | Ht 66.0 in | Wt 176.0 lb

## 2023-03-09 DIAGNOSIS — M5386 Other specified dorsopathies, lumbar region: Secondary | ICD-10-CM | POA: Diagnosis not present

## 2023-03-09 DIAGNOSIS — G894 Chronic pain syndrome: Secondary | ICD-10-CM | POA: Insufficient documentation

## 2023-03-09 DIAGNOSIS — M797 Fibromyalgia: Secondary | ICD-10-CM | POA: Insufficient documentation

## 2023-03-09 DIAGNOSIS — F321 Major depressive disorder, single episode, moderate: Secondary | ICD-10-CM | POA: Diagnosis not present

## 2023-03-09 MED ORDER — DULOXETINE HCL 60 MG PO CPEP: 60.0000 mg | ORAL_CAPSULE | Freq: Every day | ORAL | 6 refills | Status: DC

## 2023-03-09 MED ORDER — PREGABALIN 75 MG PO CAPS: 75.0000 mg | ORAL_CAPSULE | Freq: Two times a day (BID) | ORAL | 6 refills | Status: DC

## 2023-03-09 NOTE — Progress Notes (Signed)
Subjective:    Patient ID: Teresa Roberts, female    DOB: Nov 06, 1961, 62 y.o.   MRN: [id number]  HPI   Teresa Roberts is a 62 y.o. year old female  who  has a past medical history of CAD (coronary artery disease), Carpal tunnel syndrome, COPD (chronic obstructive pulmonary disease), cpap, Depression, Diastolic CHF, Fibromyalgia, Hyperlipidemia, Hypertension, Migraines, Osteoporosis, and Sciatic nerve disease.   They are presenting to PM&R clinic for follow up related to chronic pain.  Plan from last visit:    Chronic pain syndrome Assessment & Plan: Patient was noncompliant with all recommendations from last visit due to worsening generalized fatigue, pain, and depression. Had long discussion regarding the interplay of depression/anxiety with chronic pain syndromes such as fibromyalgia.    UDS last visit was appropriate, will continue non-narcotic management of chronic pain and will inform PCP of patient's desire to pursue therapy for uncontrolled depression and chronic fatigue.    Follow up with me in 3 months. We may perform trigger point injections that visit.    Orders: -     Ambulatory referral to Physical Therapy   Current moderate episode of major depressive disorder, unspecified whether recurrent (HCC) Assessment & Plan: I am increasing your Duloxetine from 30 mg once daily to 30 mg twice daily for depression. I am also going to talk to your PCP about a therapy referral.      Fibromyalgia Assessment & Plan: I am cross-tapering you from Gabapentin to Lyrica. You will half your regular doses of gabapentin and take 1 tablet of Lyrica twice daily for 1 week; then you will increase to Lyrica 2 tablets twice daily and stop gabapentin. Call me in 1-2 weeks to report effects.    I have placed an order for aquatherapy to get you moving in a gentle environment that offloads your joints. I did note need to stay in shallow water near poolside due to need for oxygen.    Orders: -      Ambulatory referral to Physical Therapy   Sciatica of left side associated with disorder of lumbar spine Assessment & Plan: Please request records from your recent MRI for review.    Schedule an injection with Dr. Ethelene Hal as soon as possible. I will ask about this at out call in 2 weeks.      Orders: -     Ambulatory referral to Physical Therapy   Interval Hx:   - Therapies: Never ended up going to PT; she had appointments set up for aquatherapy but didn't want to pay $30 copay so cancelled. Is going to the [facility] gym 2x weekly to swim. She does a lap, takes some oxygen, and then does another lap. She will do 2-3 laps before quitting.    - Follow ups: Saw her rheumatologist; was sent to the ER 3/26 form their office for a panic attack. Self-resolved, unsure of trigger. Has not recurred since then. Has not made a follow up with her PCP or attempted to call their office for an appointment.    - Falls: none   - DME: Has a single point cane she uses rarely for ambulation.    - Medications:   Was getting ESI form Dr. Ethelene Hal in her back; "they helped", last done 6 months ago. Would want to do them here if possible.   She states that the lyrica is not helping. She is only on one tablet once per day, which is not doing anything. She never had side effects.  She never tried to uptitrate as instructed. She stopped the gabapentin.   She never increased the cymbalta beyond once per day.    - Other concerns: Things at home are OK. Lives with her husband, who is supportive. No current acute stressors.   She has no hobbies. She spends the day watching TV. Does laundry, cooks, does her own ADLs. She used to like going on vacations but says there's too much equipment to bring with her. Denies SI/HI.   Pain Inventory Average Pain 8 Pain Right Now 8 My pain is constant and aching  In the last 24 hours, has pain interfered with the following? General activity 7 Relation with others 8 Enjoyment of  life 8 What TIME of day is your pain at its worst? morning  and daytime Sleep (in general) Fair  Pain is worse with: walking, bending, sitting, inactivity, and standing Pain improves with: medication Relief from Meds: 5  Family History  Problem Relation Age of Onset   Diabetes Mother    Hypertension Mother    CAD Mother    Diabetes Father    Lung cancer Father    Diabetes Sister    Hypercalcemia Neg Hx    Breast cancer Neg Hx    Social History   Socioeconomic History   Marital status: Married    Spouse name: Not on file   Number of children: Not on file   Years of education: Not on file   Highest education level: Not on file  Occupational History   Not on file  Tobacco Use   Smoking status: Former    Packs/day: 0.50    Years: 43.00    Additional pack years: 0.00    Total pack years: 21.50    Types: Cigarettes    Start date: 32    Quit date: 08/13/2022    Years since quitting: 0.5    Passive exposure: Never   Smokeless tobacco: Never  Vaping Use   Vaping Use: Never used  Substance and Sexual Activity   Alcohol use: No   Drug use: Not Currently    Types: Marijuana   Sexual activity: Not on file  Other Topics Concern   Not on file  Social History Narrative   Not on file   Social Determinants of Health   Financial Resource Strain: Not on file  Food Insecurity: Not on file  Transportation Needs: Not on file  Physical Activity: Not on file  Stress: Not on file  Social Connections: Not on file   Past Surgical History:  Procedure Laterality Date   APPENDECTOMY     BIOPSY  01/01/2021   Procedure: BIOPSY;  Surgeon: Charlott Rakes, MD;  Location: [facility];  Service: Endoscopy;;   CESAREAN SECTION     COLONOSCOPY WITH PROPOFOL N/A 01/01/2021   Procedure: COLONOSCOPY WITH PROPOFOL;  Surgeon: Charlott Rakes, MD;  Location: [facility];  Service: Endoscopy;  Laterality: N/A;   ESOPHAGOGASTRODUODENOSCOPY (EGD) WITH PROPOFOL N/A 01/01/2021   Procedure:  ESOPHAGOGASTRODUODENOSCOPY (EGD) WITH PROPOFOL;  Surgeon: Charlott Rakes, MD;  Location: [facility];  Service: Endoscopy;  Laterality: N/A;   ORIF ANKLE FRACTURE Left 08/12/2017   Procedure: OPEN REDUCTION INTERNAL FIXATION (ORIF) ANKLE FRACTURE;  Surgeon: Samson Frederic, MD;  Location: [facility] OR;  Service: Orthopedics;  Laterality: Left;   RIGHT HEART CATH N/A 07/19/2018   Procedure: RIGHT HEART CATH;  Surgeon: Dolores Patty, MD;  Location: [facility];  Service: Cardiovascular;  Laterality: N/A;   RIGHT/LEFT HEART CATH AND  CORONARY ANGIOGRAPHY N/A 01/27/2017   Procedure: Right/Left Heart Cath and Coronary Angiography;  Surgeon: Lennette Bihari, MD;  Location: [facility] CV [facility];  Service: Cardiovascular;  Laterality: N/A;   Past Surgical History:  Procedure Laterality Date   APPENDECTOMY     BIOPSY  01/01/2021   Procedure: BIOPSY;  Surgeon: Charlott Rakes, MD;  Location: [facility];  Service: Endoscopy;;   CESAREAN SECTION     COLONOSCOPY WITH PROPOFOL N/A 01/01/2021   Procedure: COLONOSCOPY WITH PROPOFOL;  Surgeon: Charlott Rakes, MD;  Location: [facility];  Service: Endoscopy;  Laterality: N/A;   ESOPHAGOGASTRODUODENOSCOPY (EGD) WITH PROPOFOL N/A 01/01/2021   Procedure: ESOPHAGOGASTRODUODENOSCOPY (EGD) WITH PROPOFOL;  Surgeon: Charlott Rakes, MD;  Location: [facility];  Service: Endoscopy;  Laterality: N/A;   ORIF ANKLE FRACTURE Left 08/12/2017   Procedure: OPEN REDUCTION INTERNAL FIXATION (ORIF) ANKLE FRACTURE;  Surgeon: Samson Frederic, MD;  Location: [facility];  Service: Orthopedics;  Laterality: Left;   RIGHT HEART CATH N/A 07/19/2018   Procedure: RIGHT HEART CATH;  Surgeon: Dolores Patty, MD;  Location: [facility];  Service: Cardiovascular;  Laterality: N/A;   RIGHT/LEFT HEART CATH AND CORONARY ANGIOGRAPHY N/A 01/27/2017   Procedure: Right/Left Heart Cath and Coronary Angiography;  Surgeon: Lennette Bihari, MD;  Location: [facility];  Service:  Cardiovascular;  Laterality: N/A;   Past Medical History:  Diagnosis Date   CAD (coronary artery disease)    a. 01/2017: cath showing 20% mid RCA stenosis and otherwise normal LAD and LCx with a preserved EF of 55 to 60%; mod pulmonary HTN   Carpal tunnel syndrome    COPD (chronic obstructive pulmonary disease)    O2 dependent   cpap    Depression    PTSD   Diastolic CHF    Fibromyalgia    Hyperlipidemia    Hypertension    Migraines    Osteoporosis    Sciatic nerve disease    LMP 08/19/2012   Opioid Risk Score:   Fall Risk Score:  `1  Depression screen PHQ 2/9     03/09/2023    1:00 PM 09/13/2022    1:11 PM 07/28/2018   10:17 AM  Depression screen PHQ 2/9  Decreased Interest 0 2 3  Down, Depressed, Hopeless 0 1 2  PHQ - 2 Score 0 3 5  Altered sleeping  0 0  Tired, decreased energy  3 3  Change in appetite  1 3  Feeling bad or failure about yourself   0 2  Trouble concentrating  1 2  Moving slowly or fidgety/restless  1 3  Suicidal thoughts  0 0  PHQ-9 Score  9 18  Difficult doing work/chores  Very difficult Very difficult    Review of Systems  Musculoskeletal:  Positive for back pain.       PAIN DOWN LEFT SIDE OF BACK DOWN TO THE LEFT FOOT      Objective:   Physical Exam   PE: Constitution: Appropriate appearance for age. No apparent distress   Resp: No respiratory distress. No accessory muscle usage. on 2 L [place] Cardio: Well perfused appearance.  No peripheral edema. Abdomen: Nondistended. Nontender.   Psych: Appropriate mood and affect. Neuro: AAOx4. No apparent cognitive deficits   Neurologic Exam:   DTRs: Reflexes were 2+ in bilateral achilles, patella, biceps, BR and triceps. Babinsky: flexor responses b/l.   Hoffmans: negative b/l Sensory exam: revealed normal sensation in all dermatomal regions in bilateral upper extremities and bilateral lower extremities  Motor exam: strength 5/5 throughout bilateral upper extremities and bilateral lower  extremities Coordination: Fine motor coordination was normal.   Gait: normal    + facet loading and + forward bending for neurothic pains down L outter thigh    Assessment & Plan:   Teresa Roberts is a 62 y.o. year old female  who  has a past medical history of CAD (coronary artery disease), Carpal tunnel syndrome, COPD (chronic obstructive pulmonary disease), cpap, Depression, Diastolic CHF, Fibromyalgia, Hyperlipidemia, Hypertension, Migraines, Osteoporosis, and Sciatic nerve disease.   They are presenting to PM&R clinic as a follow up for treatment of chronic pain.   Chronic pain syndrome Defer narcotic medications at this time due to inability to comply with medical management, likely due to poor motivation from uncontrolled depression and anxiety  Sciatica of left side associated with disorder of lumbar spine Call Dr. Ethelene Hal to repeat epidural steroid injection   Fibromyalgia Increase duloxetine from 30 mg daily to 60 mg daily Increase lyrica to twice a day  Current moderate episode of major depressive disorder, unspecified whether recurrent  I think this is the biggest barrier to improving your quality of life and pain control  I am referring you to psychiatry. Once you have gone to this appointment, you can call the office and schedule a follow up with me.   Insomnia  For sleep, I want you to pick a time to lay down every night, ideally between 8 and 10 PM.    Starting 1 hour before you want to go to sleep, turn off all television screens, phone screens, tablets, and computers.    Keep the lights low and perform only low stimulation activities, such as reading.    Only use your bedroom for sleep and sex.    You may also take 3 to 5 mg of over-the-counter melatonin approximately 1 hour before bedtime.   Use your CPAP whenever possible to help with sleep.

## 2023-03-09 NOTE — Patient Instructions (Signed)
Chronic pain syndrome Defer narcotic medications at this time due to inability to comply with medical management, likely due to poor motivation from uncontrolled depression and anxiety  Sciatica of left side associated with disorder of lumbar spine Call Dr. Ethelene Hal to repeat epidural steroid injection   Fibromyalgia Increase duloxetine from 30 mg daily to 60 mg daily Increase lyrica to twice a day  Current moderate episode of major depressive disorder, unspecified whether recurrent  I think this is the biggest barrier to improving your quality of life and pain control  I am referring you to psychiatry. Once you have gone to this appointment, you can call the office and schedule a follow up with me.   Insomnia  For sleep, I want you to pick a time to lay down every night, ideally between 8 and 10 PM.    Starting 1 hour before you want to go to sleep, turn off all television screens, phone screens, tablets, and computers.    Keep the lights low and perform only low stimulation activities, such as reading.    Only use your bedroom for sleep and sex.    You may also take 3 to 5 mg of over-the-counter melatonin approximately 1 hour before bedtime.   Use your CPAP whenever possible to help with sleep.

## 2023-03-22 DIAGNOSIS — R062 Wheezing: Secondary | ICD-10-CM | POA: Diagnosis not present

## 2023-03-22 DIAGNOSIS — J441 Chronic obstructive pulmonary disease with (acute) exacerbation: Secondary | ICD-10-CM | POA: Diagnosis not present

## 2023-03-22 DIAGNOSIS — G4733 Obstructive sleep apnea (adult) (pediatric): Secondary | ICD-10-CM | POA: Diagnosis not present

## 2023-03-22 DIAGNOSIS — M5416 Radiculopathy, lumbar region: Secondary | ICD-10-CM | POA: Diagnosis not present

## 2023-03-22 DIAGNOSIS — J449 Chronic obstructive pulmonary disease, unspecified: Secondary | ICD-10-CM | POA: Diagnosis not present

## 2023-03-22 DIAGNOSIS — J9611 Chronic respiratory failure with hypoxia: Secondary | ICD-10-CM | POA: Diagnosis not present

## 2023-03-31 DIAGNOSIS — M810 Age-related osteoporosis without current pathological fracture: Secondary | ICD-10-CM | POA: Diagnosis not present

## 2023-03-31 DIAGNOSIS — N183 Chronic kidney disease, stage 3 unspecified: Secondary | ICD-10-CM | POA: Diagnosis not present

## 2023-03-31 DIAGNOSIS — I2723 Pulmonary hypertension due to lung diseases and hypoxia: Secondary | ICD-10-CM | POA: Diagnosis not present

## 2023-03-31 DIAGNOSIS — J449 Chronic obstructive pulmonary disease, unspecified: Secondary | ICD-10-CM | POA: Diagnosis not present

## 2023-03-31 DIAGNOSIS — I25119 Atherosclerotic heart disease of native coronary artery with unspecified angina pectoris: Secondary | ICD-10-CM | POA: Diagnosis not present

## 2023-03-31 DIAGNOSIS — E210 Primary hyperparathyroidism: Secondary | ICD-10-CM | POA: Diagnosis not present

## 2023-03-31 DIAGNOSIS — M797 Fibromyalgia: Secondary | ICD-10-CM | POA: Diagnosis not present

## 2023-03-31 DIAGNOSIS — F17210 Nicotine dependence, cigarettes, uncomplicated: Secondary | ICD-10-CM | POA: Diagnosis not present

## 2023-04-05 ENCOUNTER — Other Ambulatory Visit: Payer: Self-pay | Admitting: Physical Medicine and Rehabilitation

## 2023-04-06 DIAGNOSIS — J441 Chronic obstructive pulmonary disease with (acute) exacerbation: Secondary | ICD-10-CM | POA: Diagnosis not present

## 2023-04-06 DIAGNOSIS — J449 Chronic obstructive pulmonary disease, unspecified: Secondary | ICD-10-CM | POA: Diagnosis not present

## 2023-04-06 DIAGNOSIS — J9611 Chronic respiratory failure with hypoxia: Secondary | ICD-10-CM | POA: Diagnosis not present

## 2023-04-06 DIAGNOSIS — R062 Wheezing: Secondary | ICD-10-CM | POA: Diagnosis not present

## 2023-04-06 NOTE — Telephone Encounter (Signed)
Signed refill for Duloxetine 60 mg daily 90 days R3

## 2023-04-07 ENCOUNTER — Telehealth: Payer: Self-pay | Admitting: *Deleted

## 2023-04-08 NOTE — Telephone Encounter (Signed)
Received a potential clinical concern from [employer].   Opioid Risk Management: Overlapping days supply of muscle relaxants.   Possible Risks: An excessive utilization of muscle relaxants may be an indicator of physical and psychological dependence.   Reviewed by: Dr. Pollyann Savoy   Recommendations: Discontinue Tizanidine, PCP is giving Flexeril.   Patient advised to discontinue Tizanidine. Patient expressed understanding.

## 2023-04-22 DIAGNOSIS — G4733 Obstructive sleep apnea (adult) (pediatric): Secondary | ICD-10-CM | POA: Diagnosis not present

## 2023-04-22 DIAGNOSIS — R062 Wheezing: Secondary | ICD-10-CM | POA: Diagnosis not present

## 2023-04-22 DIAGNOSIS — J449 Chronic obstructive pulmonary disease, unspecified: Secondary | ICD-10-CM | POA: Diagnosis not present

## 2023-04-22 DIAGNOSIS — J9611 Chronic respiratory failure with hypoxia: Secondary | ICD-10-CM | POA: Diagnosis not present

## 2023-04-22 DIAGNOSIS — J441 Chronic obstructive pulmonary disease with (acute) exacerbation: Secondary | ICD-10-CM | POA: Diagnosis not present

## 2023-05-06 ENCOUNTER — Other Ambulatory Visit: Payer: Self-pay | Admitting: Pulmonary Disease

## 2023-05-06 ENCOUNTER — Encounter: Payer: Self-pay | Admitting: Hematology

## 2023-05-06 ENCOUNTER — Ambulatory Visit (HOSPITAL_BASED_OUTPATIENT_CLINIC_OR_DEPARTMENT_OTHER): Payer: Medicare Other | Admitting: Cardiology

## 2023-05-07 DIAGNOSIS — J449 Chronic obstructive pulmonary disease, unspecified: Secondary | ICD-10-CM | POA: Diagnosis not present

## 2023-05-07 DIAGNOSIS — R062 Wheezing: Secondary | ICD-10-CM | POA: Diagnosis not present

## 2023-05-07 DIAGNOSIS — J441 Chronic obstructive pulmonary disease with (acute) exacerbation: Secondary | ICD-10-CM | POA: Diagnosis not present

## 2023-05-07 DIAGNOSIS — J9611 Chronic respiratory failure with hypoxia: Secondary | ICD-10-CM | POA: Diagnosis not present

## 2023-05-10 ENCOUNTER — Telehealth: Payer: Self-pay | Admitting: Adult Health

## 2023-05-10 MED ORDER — TRELEGY ELLIPTA 100-62.5-25 MCG/ACT IN AEPB: 1.0000 | INHALATION_SPRAY | Freq: Every day | RESPIRATORY_TRACT | 0 refills | Status: DC

## 2023-05-10 NOTE — Telephone Encounter (Signed)
Called and spoke with patient. She stated that she needed to have a refill on her Trelegy sent to a local pharmacy in [place]. [facility] [place] [facility] [address] I advised her that I would go ahead and send in the RX for her and she verbalized understanding.   Nothing further needed at time of call.

## 2023-05-10 NOTE — Telephone Encounter (Signed)
PT is calling for Trelegy refill. Completely out.  Martyn Ehrich is [address] in [place]  She will only be here in [place] today so sending back [employer].

## 2023-05-22 DIAGNOSIS — G4733 Obstructive sleep apnea (adult) (pediatric): Secondary | ICD-10-CM | POA: Diagnosis not present

## 2023-05-22 DIAGNOSIS — R062 Wheezing: Secondary | ICD-10-CM | POA: Diagnosis not present

## 2023-05-22 DIAGNOSIS — J9611 Chronic respiratory failure with hypoxia: Secondary | ICD-10-CM | POA: Diagnosis not present

## 2023-05-22 DIAGNOSIS — J449 Chronic obstructive pulmonary disease, unspecified: Secondary | ICD-10-CM | POA: Diagnosis not present

## 2023-05-22 DIAGNOSIS — J441 Chronic obstructive pulmonary disease with (acute) exacerbation: Secondary | ICD-10-CM | POA: Diagnosis not present

## 2023-05-26 ENCOUNTER — Other Ambulatory Visit: Payer: Self-pay | Admitting: Rheumatology

## 2023-05-26 NOTE — Telephone Encounter (Signed)
Last Fill: 02/24/2023  Next Visit: 08/17/2023  Last Visit: 02/10/2023  Dx: Fibromyalgia   Current Dose per office note on 02/10/2023: tizanidine 4 mg p.o. nightly.    Okay to refill Tizanidine?

## 2023-06-02 ENCOUNTER — Encounter (HOSPITAL_COMMUNITY): Payer: Self-pay | Admitting: Psychiatry

## 2023-06-02 ENCOUNTER — Other Ambulatory Visit: Payer: Self-pay

## 2023-06-02 ENCOUNTER — Ambulatory Visit (HOSPITAL_COMMUNITY): Payer: Medicare Other | Admitting: Psychiatry

## 2023-06-02 VITALS — Ht 66.0 in | Wt 187.6 lb

## 2023-06-02 DIAGNOSIS — F4312 Post-traumatic stress disorder, chronic: Secondary | ICD-10-CM | POA: Diagnosis not present

## 2023-06-02 DIAGNOSIS — F331 Major depressive disorder, recurrent, moderate: Secondary | ICD-10-CM

## 2023-06-02 MED ORDER — TRAZODONE HCL 150 MG PO TABS
150.0000 mg | ORAL_TABLET | Freq: Every day | ORAL | 1 refills | Status: DC
Start: 2023-06-02 — End: 2023-07-14

## 2023-06-02 MED ORDER — BUSPIRONE HCL 10 MG PO TABS
10.0000 mg | ORAL_TABLET | Freq: Three times a day (TID) | ORAL | 1 refills | Status: DC
Start: 2023-06-02 — End: 2023-07-14
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
## 2023-06-06 DIAGNOSIS — J449 Chronic obstructive pulmonary disease, unspecified: Secondary | ICD-10-CM | POA: Diagnosis not present

## 2023-06-06 DIAGNOSIS — J441 Chronic obstructive pulmonary disease with (acute) exacerbation: Secondary | ICD-10-CM | POA: Diagnosis not present

## 2023-06-06 DIAGNOSIS — J9611 Chronic respiratory failure with hypoxia: Secondary | ICD-10-CM | POA: Diagnosis not present

## 2023-06-06 DIAGNOSIS — R062 Wheezing: Secondary | ICD-10-CM | POA: Diagnosis not present

## 2023-06-08 ENCOUNTER — Other Ambulatory Visit: Payer: Self-pay | Admitting: Pulmonary Disease

## 2023-06-22 DIAGNOSIS — J9611 Chronic respiratory failure with hypoxia: Secondary | ICD-10-CM | POA: Diagnosis not present

## 2023-06-22 DIAGNOSIS — J449 Chronic obstructive pulmonary disease, unspecified: Secondary | ICD-10-CM | POA: Diagnosis not present

## 2023-06-22 DIAGNOSIS — J441 Chronic obstructive pulmonary disease with (acute) exacerbation: Secondary | ICD-10-CM | POA: Diagnosis not present

## 2023-06-22 DIAGNOSIS — R062 Wheezing: Secondary | ICD-10-CM | POA: Diagnosis not present

## 2023-06-22 DIAGNOSIS — G4733 Obstructive sleep apnea (adult) (pediatric): Secondary | ICD-10-CM | POA: Diagnosis not present

## 2023-07-02 ENCOUNTER — Other Ambulatory Visit (HOSPITAL_COMMUNITY): Payer: Self-pay | Admitting: Psychiatry

## 2023-07-02 DIAGNOSIS — F4312 Post-traumatic stress disorder, chronic: Secondary | ICD-10-CM

## 2023-07-02 DIAGNOSIS — F331 Major depressive disorder, recurrent, moderate: Secondary | ICD-10-CM

## 2023-07-07 DIAGNOSIS — J449 Chronic obstructive pulmonary disease, unspecified: Secondary | ICD-10-CM | POA: Diagnosis not present

## 2023-07-07 DIAGNOSIS — R062 Wheezing: Secondary | ICD-10-CM | POA: Diagnosis not present

## 2023-07-07 DIAGNOSIS — J9611 Chronic respiratory failure with hypoxia: Secondary | ICD-10-CM | POA: Diagnosis not present

## 2023-07-07 DIAGNOSIS — J441 Chronic obstructive pulmonary disease with (acute) exacerbation: Secondary | ICD-10-CM | POA: Diagnosis not present

## 2023-07-12 ENCOUNTER — Encounter (HOSPITAL_COMMUNITY): Payer: Self-pay

## 2023-07-12 NOTE — Progress Notes (Unsigned)
Office Visit Note  Patient: Teresa Roberts             Date of Birth: 07-Sep-1961           MRN: [id number]             PCP: Teresa Palmer, MD Referring: Teresa Palmer, MD Visit Date: 07/13/2023 Occupation: @GUAROCC @  Subjective:  No chief complaint on file.   History of Present Illness: Teresa Roberts is a 62 y.o. female ***     Activities of Daily Living:  Patient reports morning stiffness for *** {minute/hour:19697}.   Patient {ACTIONS;DENIES/REPORTS:21021675::"Denies"} nocturnal pain.  Difficulty dressing/grooming: {ACTIONS;DENIES/REPORTS:21021675::"Denies"} Difficulty climbing stairs: {ACTIONS;DENIES/REPORTS:21021675::"Denies"} Difficulty getting out of chair: {ACTIONS;DENIES/REPORTS:21021675::"Denies"} Difficulty using hands for taps, buttons, cutlery, and/or writing: {ACTIONS;DENIES/REPORTS:21021675::"Denies"}  No Rheumatology ROS completed.   PMFS History:  Patient Active Problem List   Diagnosis Date Noted   Sciatica of left side associated with disorder of lumbar spine 09/13/2022   Chronic pain syndrome 09/13/2022   Encounter for therapeutic drug monitoring 09/13/2022   Personal history of colonic polyps 01/01/2021   Esophageal reflux 01/01/2021   Hypercalcemia 09/23/2020   Hyperparathyroidism, primary (HCC) 08/25/2020   Iron deficiency anemia due to chronic blood loss 06/27/2019   Coronary artery disease involving native coronary artery of native heart without angina pectoris 09/05/2018   Syncope and collapse 09/05/2018   Pain in joint of left elbow 09/04/2018   Chest pain 05/27/2018   HLD (hyperlipidemia) 05/27/2018   Essential hypertension 05/27/2018   Depression 05/27/2018   OSA (obstructive sleep apnea) 05/03/2018   Pulmonary hypertension (HCC) 05/03/2018   Bimalleolar fracture of left ankle 08/12/2017   Acute renal failure (ARF) (HCC) 08/09/2017   Chronic diastolic (congestive) heart failure (HCC) 08/09/2017   Other fatigue 06/13/2017   Primary  insomnia 06/13/2017   History of migraine 06/13/2017   COPD (chronic obstructive pulmonary disease) (HCC) 02/15/2017   Tobacco abuse 02/15/2017   Respiratory failure with hypoxia (HCC) 01/28/2017   Migraine 01/28/2017   Daily headache    Hypoxia    Non-ST elevation myocardial infarction (NSTEMI), type 2 01/26/2017   MGUS (monoclonal gammopathy of unknown significance) 01/11/2012   Fibromyalgia 01/11/2012   Bruises easily 01/11/2012    Past Medical History:  Diagnosis Date   CAD (coronary artery disease)    a. 01/2017: cath showing 20% mid RCA stenosis and otherwise normal LAD and LCx with a preserved EF of 55 to 60%; mod pulmonary HTN   Carpal tunnel syndrome    COPD (chronic obstructive pulmonary disease) (HCC)    O2 dependent   cpap    Depression    PTSD   Diastolic CHF (HCC)    Fibromyalgia    Hyperlipidemia    Hypertension    Migraines    Osteoporosis    Sciatic nerve disease     Family History  Problem Relation Age of Onset   Diabetes Mother    Hypertension Mother    CAD Mother    Diabetes Father    Lung cancer Father    Diabetes Sister    Hypercalcemia Neg Hx    Breast cancer Neg Hx    Past Surgical History:  Procedure Laterality Date   APPENDECTOMY     BIOPSY  01/01/2021   Procedure: BIOPSY;  Surgeon: Teresa Rakes, MD;  [facility];  Service: Endoscopy;;   CESAREAN SECTION     COLONOSCOPY WITH PROPOFOL N/A 01/01/2021   Procedure: COLONOSCOPY WITH PROPOFOL;  Surgeon: Teresa Rakes, MD;  [facility];  Service: Endoscopy;  Laterality: N/A;   ESOPHAGOGASTRODUODENOSCOPY (EGD) WITH PROPOFOL N/A 01/01/2021   Procedure: ESOPHAGOGASTRODUODENOSCOPY (EGD) WITH PROPOFOL;  Surgeon: Teresa Rakes, MD;  [facility];  Service: Endoscopy;  Laterality: N/A;   ORIF ANKLE FRACTURE Left 08/12/2017   Procedure: OPEN REDUCTION INTERNAL FIXATION (ORIF) ANKLE FRACTURE;  Surgeon: Teresa Frederic, MD;  [facility];  Service: Orthopedics;   Laterality: Left;   RIGHT HEART CATH N/A 07/19/2018   Procedure: RIGHT HEART CATH;  Surgeon: Teresa Patty, MD;  [facility];  Service: Cardiovascular;  Laterality: N/A;   RIGHT/LEFT HEART CATH AND CORONARY ANGIOGRAPHY N/A 01/27/2017   Procedure: Right/Left Heart Cath and Coronary Angiography;  Surgeon: Teresa Bihari, MD;  [facility];  Service: Cardiovascular;  Laterality: N/A;   Social History   Social History Narrative   Not on file   Immunization History  Administered Date(s) Administered   Influenza Split 08/15/2017, 08/28/2018, 08/24/2019   Influenza,inj,Quad PF,6+ Mos 12/28/2016, 09/28/2017, 09/04/2018, 08/03/2019, 08/29/2020, 08/03/2021, 08/11/2022   Influenza,inj,quad, With Preservative 08/09/2014   Influenza-Unspecified 08/15/2016   Moderna Sars-Covid-2 Vaccination 02/11/2020, 03/10/2020, 07/11/2020   Pneumococcal Polysaccharide-23 02/04/2014, 08/31/2016   Pneumococcal-Unspecified 02/13/2013   Respiratory Syncytial Virus Vaccine,Recomb Aduvanted(Arexvy) 08/11/2022   Tdap 04/25/2013, 02/04/2014   Zoster, Live 05/31/2019, 08/03/2019     Objective: Vital Signs: LMP 08/19/2012    Physical Exam   Musculoskeletal Exam: ***  CDAI Exam: CDAI Score: -- Patient Global: --; Provider Global: -- Swollen: --; Tender: -- Joint Exam 07/13/2023   No joint exam has been documented for this visit   There is currently no information documented on the homunculus. Go to the Rheumatology activity and complete the homunculus joint exam.  Investigation: No additional findings.  Imaging: No results found.  Recent Labs: Lab Results  Component Value Date   WBC 5.2 08/27/2019   HGB 13.3 08/27/2019   PLT 164 08/27/2019   NA 142 08/27/2019   K 2.9 (LL) 08/27/2019   CL 100 08/27/2019   CO2 34 (H) 08/27/2019   GLUCOSE 97 08/27/2019   BUN 12 08/27/2019   CREATININE 1.04 (H) 08/27/2019   BILITOT <0.2 (L) 08/27/2019   ALKPHOS 112 08/27/2019    AST 12 (L) 08/27/2019   ALT 13 08/27/2019   PROT 7.5 08/27/2019   ALBUMIN 3.9 08/27/2019   CALCIUM 10.3 09/23/2020   GFRAA >60 08/27/2019    Speciality Comments: No specialty comments available.  Procedures:  No procedures performed Allergies: Citrus and Lisinopril   Assessment / Plan:     Visit Diagnoses: No diagnosis found.  Orders: No orders of the defined types were placed in this encounter.  No orders of the defined types were placed in this encounter.   Face-to-face time spent with patient was *** minutes. Greater than 50% of time was spent in counseling and coordination of care.  Follow-Up Instructions: No follow-ups on file.   Ellen Henri, CMA  Note - This record has been created using Animal nutritionist.  Chart creation errors have been sought, but may not always  have been located. Such creation errors do not reflect on  the standard of medical care.

## 2023-07-13 ENCOUNTER — Encounter: Payer: Self-pay | Admitting: Rheumatology

## 2023-07-13 ENCOUNTER — Ambulatory Visit: Payer: Medicare Other | Attending: Rheumatology | Admitting: Rheumatology

## 2023-07-13 ENCOUNTER — Other Ambulatory Visit: Payer: Self-pay

## 2023-07-13 VITALS — BP 104/73 | HR 88 | Resp 17 | Ht 66.0 in | Wt 186.6 lb

## 2023-07-13 DIAGNOSIS — M62838 Other muscle spasm: Secondary | ICD-10-CM

## 2023-07-13 DIAGNOSIS — R5383 Other fatigue: Secondary | ICD-10-CM | POA: Diagnosis not present

## 2023-07-13 DIAGNOSIS — G5603 Carpal tunnel syndrome, bilateral upper limbs: Secondary | ICD-10-CM

## 2023-07-13 DIAGNOSIS — M797 Fibromyalgia: Secondary | ICD-10-CM | POA: Diagnosis not present

## 2023-07-13 DIAGNOSIS — M7061 Trochanteric bursitis, right hip: Secondary | ICD-10-CM | POA: Diagnosis not present

## 2023-07-13 DIAGNOSIS — M545 Low back pain, unspecified: Secondary | ICD-10-CM | POA: Diagnosis not present

## 2023-07-13 DIAGNOSIS — M7062 Trochanteric bursitis, left hip: Secondary | ICD-10-CM

## 2023-07-13 DIAGNOSIS — K219 Gastro-esophageal reflux disease without esophagitis: Secondary | ICD-10-CM

## 2023-07-13 DIAGNOSIS — I272 Pulmonary hypertension, unspecified: Secondary | ICD-10-CM | POA: Diagnosis not present

## 2023-07-13 DIAGNOSIS — Z8669 Personal history of other diseases of the nervous system and sense organs: Secondary | ICD-10-CM

## 2023-07-13 DIAGNOSIS — I5032 Chronic diastolic (congestive) heart failure: Secondary | ICD-10-CM

## 2023-07-13 DIAGNOSIS — D472 Monoclonal gammopathy: Secondary | ICD-10-CM

## 2023-07-13 DIAGNOSIS — F419 Anxiety disorder, unspecified: Secondary | ICD-10-CM

## 2023-07-13 DIAGNOSIS — E559 Vitamin D deficiency, unspecified: Secondary | ICD-10-CM | POA: Diagnosis not present

## 2023-07-13 DIAGNOSIS — Z8709 Personal history of other diseases of the respiratory system: Secondary | ICD-10-CM | POA: Diagnosis not present

## 2023-07-13 DIAGNOSIS — F5101 Primary insomnia: Secondary | ICD-10-CM

## 2023-07-13 DIAGNOSIS — I2510 Atherosclerotic heart disease of native coronary artery without angina pectoris: Secondary | ICD-10-CM

## 2023-07-13 DIAGNOSIS — Z87891 Personal history of nicotine dependence: Secondary | ICD-10-CM

## 2023-07-13 DIAGNOSIS — G8929 Other chronic pain: Secondary | ICD-10-CM

## 2023-07-13 DIAGNOSIS — I10 Essential (primary) hypertension: Secondary | ICD-10-CM

## 2023-07-13 DIAGNOSIS — M810 Age-related osteoporosis without current pathological fracture: Secondary | ICD-10-CM

## 2023-07-13 MED ORDER — LIDOCAINE HCL 1 % IJ SOLN
0.5000 mL | INTRAMUSCULAR | Status: AC | PRN
Start: 2023-07-13 — End: 2023-07-13
  Administered 2023-07-13: .5 mL

## 2023-07-13 MED ORDER — TRIAMCINOLONE ACETONIDE 40 MG/ML IJ SUSP
20.0000 mg | INTRAMUSCULAR | Status: AC | PRN
Start: 2023-07-13 — End: 2023-07-13
  Administered 2023-07-13: 20 mg via INTRAMUSCULAR

## 2023-07-13 NOTE — Telephone Encounter (Signed)
Pending lab results, patient will be starting fosamax per Dr. Corliss Skains. [facility] [address]. Thanks!

## 2023-07-13 NOTE — Patient Instructions (Addendum)
Please take calcium 1200 mg (between dietary and supplement)  Alendronate Tablets What is this medication? ALENDRONATE (a LEN droe nate) prevents and treats osteoporosis. It may also be used to treat Paget disease of the bone. It works by Interior and spatial designer stronger and less likely to break (fracture). It belongs to a group of medications called bisphosphonates. This medicine may be used for other purposes; ask your health care provider or pharmacist if you have questions. COMMON BRAND NAME(S): Fosamax What should I tell my care team before I take this medication? They need to know if you have any of these conditions: Bleeding disorder Cancer Dental disease Difficulty swallowing Infection (fever, chills, cough, sore throat, pain or trouble passing urine) Kidney disease Low levels of calcium or other minerals in the blood Low red blood cell counts Receiving steroids like dexamethasone or prednisone Stomach or intestine problems Trouble sitting or standing for 30 minutes An unusual or allergic reaction to alendronate, other medications, foods, dyes or preservatives Pregnant or trying to get pregnant Breast-feeding How should I use this medication? Take this medication by mouth with a full glass of water. Take it as directed on the prescription label at the same time every day. Take the dose right after waking up. Do not eat or drink anything before taking it. Do not take it with any other drink except water. Do not chew or crush the tablet. After taking it, do not eat breakfast, drink, or take any other medications or vitamins for at least 30 minutes. Sit or stand up for at least 30 minutes after you take it. Do not lie down. Keep taking it unless your care team tells you to stop. A special MedGuide will be given to you by the pharmacist with each prescription and refill. Be sure to read this information carefully each time. Talk to your care team about the use of this medication in children.  Special care may be needed. Overdosage: If you think you have taken too much of this medicine contact a poison control center or emergency room at once. NOTE: This medicine is only for you. Do not share this medicine with others. What if I miss a dose? If you take your medication once a day, skip it. Take your next dose at the scheduled time the next morning. Do not take two doses on the same day. If you take your medication once a week, take the missed dose on the morning after you remember. Do not take two doses on the same day. What may interact with this medication? Aluminum hydroxide Antacids Aspirin Calcium supplements Medications for inflammation like ibuprofen, naproxen, and others Iron supplements Magnesium supplements Vitamins with minerals This list may not describe all possible interactions. Give your health care provider a list of all the medicines, herbs, non-prescription drugs, or dietary supplements you use. Also tell them if you smoke, drink alcohol, or use illegal drugs. Some items may interact with your medicine. What should I watch for while using this medication? Visit your care team for regular checks on your progress. It may be some time before you see the benefit from this medication. Some people who take this medication have severe bone, joint, or muscle pain. This medication may also increase your risk for jaw problems or a broken thigh bone. Tell your care team right away if you have severe pain in your jaw, bones, joints, or muscles. Tell you care team if you have any pain that does not go away or that gets  worse. Tell your dentist and dental surgeon that you are taking this medication. You should not have major dental surgery while on this medication. See your dentist to have a dental exam and fix any dental problems before starting this medication. Take good care of your teeth while on this medication. Make sure you see your dentist for regular follow-up  appointments. You should make sure you get enough calcium and vitamin D while you are taking this medication. Discuss the foods you eat and the vitamins you take with your care team. You may need blood work done while you are taking this medication. What side effects may I notice from receiving this medication? Side effects that you should report to your care team as soon as possible: Allergic reactions--skin rash, itching, hives, swelling of the face, lips, tongue, or throat Low calcium level--muscle pain or cramps, confusion, tingling, or numbness in the hands or feet Osteonecrosis of the jaw--pain, swelling, or redness in the mouth, numbness of the jaw, poor healing after dental work, unusual discharge from the mouth, visible bones in the mouth Pain or trouble swallowing Severe bone, joint, or muscle pain Stomach bleeding--bloody or black, tar-like stools, vomiting blood or brown material that looks like coffee grounds Side effects that usually do not require medical attention (report to your care team if they continue or are bothersome): Constipation Diarrhea Nausea Stomach pain This list may not describe all possible side effects. Call your doctor for medical advice about side effects. You may report side effects to FDA at 1-800-FDA-1088. Where should I keep my medication? Keep out of the reach of children and pets. Store at room temperature between 15 and 30 degrees C (59 and 86 degrees F). Throw away any unused medication after the expiration date. NOTE: This sheet is a summary. It may not cover all possible information. If you have questions about this medicine, talk to your doctor, pharmacist, or health care provider.  2024 Elsevier/Gold Standard (2020-11-13 00:00:00) Osteoporosis  Osteoporosis happens when the bones become thin and less dense than normal. Osteoporosis makes bones more brittle and fragile and more likely to break (fracture). Over time, osteoporosis can cause your  bones to become so weak that they fracture after a minor fall. Bones in the hip, wrist, and spine are most likely to fracture due to osteoporosis. What are the causes? The exact cause of this condition is not known. What increases the risk? You are more likely to develop this condition if you: Have family members with this condition. Have poor nutrition. Use the following: Steroid medicines, such as prednisone. Anti-seizure medicines. Nicotine or tobacco, such as cigarettes, e-cigarettes, and chewing tobacco. Are female. Are age 55 or older. Are not physically active (are sedentary). Are of European or Asian descent. Have a small body frame. What are the signs or symptoms? A fracture might be the first sign of osteoporosis, especially if the fracture results from a fall or injury that usually would not cause a bone to break. Other signs and symptoms include: Pain in the neck or low back. Stooped posture. Loss of height. How is this diagnosed? This condition may be diagnosed based on: Your medical history. A physical exam. A bone mineral density test, also called a DXA or DEXA test (dual-energy X-ray absorptiometry test). This test uses X-rays to measure the amount of minerals in your bones. How is this treated? This condition may be treated by: Making lifestyle changes, such as: Including foods with more calcium and vitamin D in  your diet. Doing weight-bearing and muscle-strengthening exercises. Stopping tobacco use. Limiting alcohol intake. Taking medicine to slow the process of bone loss or to increase bone density. Taking daily supplements of calcium and vitamin D. Taking hormone replacement medicines, such as estrogen for women and testosterone for men. Monitoring your levels of calcium and vitamin D. The goal of treatment is to strengthen your bones and lower your risk for a fracture. Follow these instructions at home: Eating and drinking Include calcium and vitamin D in  your diet. Calcium is important for bone health, and vitamin D helps your body absorb calcium. Good sources of calcium and vitamin D include: Certain fatty fish, such as salmon and tuna. Products that have calcium and vitamin D added to them (are fortified), such as fortified cereals. Egg yolks. Cheese. Liver.  Activity Do exercises as told by your health care provider. Ask your health care provider what exercises and activities are safe for you. You should do: Exercises that make you work against gravity (weight-bearing exercises), such as tai chi, yoga, or walking. Exercises to strengthen muscles, such as lifting weights. Lifestyle Do not drink alcohol if: Your health care provider tells you not to drink. You are pregnant, may be pregnant, or are planning to become pregnant. If you drink alcohol: Limit how much you use to: 0-1 drink a day for women. 0-2 drinks a day for men. Know how much alcohol is in your drink. In the [place] one drink equals one 12 oz bottle of beer (355 mL), one 5 oz glass of wine (148 mL), or one 1 oz glass of hard liquor (44 mL). Do not use any products that contain nicotine or tobacco, such as cigarettes, e-cigarettes, and chewing tobacco. If you need help quitting, ask your health care provider. Preventing falls Use devices to help you move around (mobility aids) as needed, such as canes, walkers, scooters, or crutches. Keep rooms well-lit and clutter-free. Remove tripping hazards from walkways, including cords and throw rugs. Install grab bars in bathrooms and safety rails on stairs. Use rubber mats in the bathroom and other areas that are often wet or slippery. Wear closed-toe shoes that fit well and support your feet. Wear shoes that have rubber soles or low heels. Review your medicines with your health care provider. Some medicines can cause dizziness or changes in blood pressure, which can increase your risk of falling. General instructions Take  over-the-counter and prescription medicines only as told by your health care provider. Keep all follow-up visits. This is important. Contact a health care provider if: You have never been screened for osteoporosis and you are: A woman who is age 28 or older. A man who is age 81 or older. Get help right away if: You fall or injure yourself. Summary Osteoporosis is thinning and loss of density in your bones. This makes bones more brittle and fragile and more likely to break (fracture),even with minor falls. The goal of treatment is to strengthen your bones and lower your risk for a fracture. Include calcium and vitamin D in your diet. Calcium is important for bone health, and vitamin D helps your body absorb calcium. Talk with your health care provider about screening for osteoporosis if you are a woman who is age 49 or older, or a man who is age 47 or older. This information is not intended to replace advice given to you by your health care provider. Make sure you discuss any questions you have with your health care provider. Document  Revised: 04/17/2020 Document Reviewed: 04/17/2020 Elsevier Patient Education  2024 [employer].

## 2023-07-14 ENCOUNTER — Encounter (HOSPITAL_COMMUNITY): Payer: Self-pay

## 2023-07-14 ENCOUNTER — Encounter (HOSPITAL_COMMUNITY): Payer: Self-pay | Admitting: Psychiatry

## 2023-07-14 ENCOUNTER — Telehealth (HOSPITAL_COMMUNITY): Payer: Self-pay | Admitting: Psychiatry

## 2023-07-14 ENCOUNTER — Telehealth (HOSPITAL_BASED_OUTPATIENT_CLINIC_OR_DEPARTMENT_OTHER): Payer: Medicare Other | Admitting: Psychiatry

## 2023-07-14 VITALS — Wt 187.0 lb

## 2023-07-14 DIAGNOSIS — F331 Major depressive disorder, recurrent, moderate: Secondary | ICD-10-CM | POA: Diagnosis not present

## 2023-07-14 DIAGNOSIS — F4312 Post-traumatic stress disorder, chronic: Secondary | ICD-10-CM | POA: Diagnosis not present

## 2023-07-14 MED ORDER — ARIPIPRAZOLE 5 MG PO TABS: ORAL_TABLET | ORAL | 1 refills | Status: DC

## 2023-07-14 MED ORDER — TRAZODONE HCL 150 MG PO TABS
150.0000 mg | ORAL_TABLET | Freq: Every day | ORAL | 1 refills | Status: AC
Start: 2023-07-14 — End: ?

## 2023-07-14 MED ORDER — BUSPIRONE HCL 10 MG PO TABS
10.0000 mg | ORAL_TABLET | Freq: Three times a day (TID) | ORAL | 1 refills | Status: AC
Start: 2023-07-14 — End: ?
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
## 2023-07-14 NOTE — Telephone Encounter (Signed)
D:  Patient was seen by Dr.Arfeen today at 11 a.m. in the clinic.  Dr. Lolly Mustache is requesting resources for pt to see an EMDR therapist.  He requested the case manager to call pt and provide her with the resources.  A:  Placed call to pt, but there was no answer and her mailbox is full so cm couldn't leave a vm.  List of recommended EMDR Therapists for pt are:  1) Christophe Louis, [facility]  [phone] 2) Hoy Register [facility]  [phone]  3) Domingo Madeira, [facility]  [phone]  4) Rosario Adie, PhD  [phone]  5) Maris Berger, [facility]  [phone]. Inform Dr. Lolly Mustache that there's a list of recommended EMDR therapists in a telephone note.

## 2023-07-14 NOTE — Progress Notes (Signed)
Hemoglobin is low at 9.6.  Which is a significant drop from her previous hemoglobin of 13, 3 years ago.  Patient should be evaluated by her PCP for anemia.  If patient has not had a colonoscopy she should get a colonoscopy as well.

## 2023-07-15 ENCOUNTER — Telehealth: Payer: Self-pay | Admitting: Adult Health

## 2023-07-15 NOTE — Telephone Encounter (Signed)
Virgel Manifold from [facility] is calling wanting notes that indicate that patient is oxygen dependent. Fax number [phone]

## 2023-07-19 MED ORDER — ALENDRONATE SODIUM 70 MG PO TABS: 70.0000 mg | ORAL_TABLET | ORAL | 0 refills | Status: DC

## 2023-07-19 NOTE — Telephone Encounter (Signed)
If her labs are normal we will start her on alendronate 70 mg p.o. weekly.   Hemoglobin is low at 9.6.  Which is a significant drop from her previous hemoglobin of 13, 3 years ago.

## 2023-07-19 NOTE — Telephone Encounter (Signed)
Called Teresa Roberts back with [facility] have Faxed more than 3 X and comes back commu.error or busy

## 2023-07-20 ENCOUNTER — Telehealth (HOSPITAL_COMMUNITY): Payer: Medicare Other | Admitting: Psychiatry

## 2023-07-20 LAB — PROTEIN ELECTROPHORESIS, SERUM, WITH REFLEX
Albumin ELP: 3.9 g/dL (ref 3.8–4.8)
Alpha 1: 0.4 g/dL — ABNORMAL HIGH (ref 0.2–0.3)
Alpha 2: 0.8 g/dL (ref 0.5–0.9)
Beta 2: 0.4 g/dL (ref 0.2–0.5)
Beta Globulin: 0.7 g/dL — ABNORMAL HIGH (ref 0.4–0.6)
Gamma Globulin: 1 g/dL (ref 0.8–1.7)
Total Protein: 7.2 g/dL (ref 6.1–8.1)

## 2023-07-20 LAB — CBC WITH DIFFERENTIAL/PLATELET
Absolute Monocytes: 539 {cells}/uL (ref 200–950)
Basophils Absolute: 28 {cells}/uL (ref 0–200)
Basophils Relative: 0.4 %
Eosinophils Absolute: 133 {cells}/uL (ref 15–500)
Eosinophils Relative: 1.9 %
HCT: 34.7 % — ABNORMAL LOW (ref 35.0–45.0)
Hemoglobin: 9.6 g/dL — ABNORMAL LOW (ref 11.7–15.5)
Lymphs Abs: 1183 {cells}/uL (ref 850–3900)
MCH: 21.1 pg — ABNORMAL LOW (ref 27.0–33.0)
MCHC: 27.7 g/dL — ABNORMAL LOW (ref 32.0–36.0)
MCV: 76.3 fL — ABNORMAL LOW (ref 80.0–100.0)
MPV: 10 fL (ref 7.5–12.5)
Monocytes Relative: 7.7 %
Neutro Abs: 5117 {cells}/uL (ref 1500–7800)
Neutrophils Relative %: 73.1 %
Platelets: 324 10*3/uL (ref 140–400)
RBC: 4.55 10*6/uL (ref 3.80–5.10)
RDW: 20.7 % — ABNORMAL HIGH (ref 11.0–15.0)
Total Lymphocyte: 16.9 %
WBC: 7 10*3/uL (ref 3.8–10.8)

## 2023-07-20 LAB — COMPLETE METABOLIC PANEL WITH GFR
AG Ratio: 1.2 (calc) (ref 1.0–2.5)
ALT: 7 U/L (ref 6–29)
AST: 13 U/L (ref 10–35)
Albumin: 4.1 g/dL (ref 3.6–5.1)
Alkaline phosphatase (APISO): 140 U/L (ref 37–153)
BUN: 11 mg/dL (ref 7–25)
CO2: 30 mmol/L (ref 20–32)
Calcium: 10.1 mg/dL (ref 8.6–10.4)
Chloride: 102 mmol/L (ref 98–110)
Creat: 1.04 mg/dL (ref 0.50–1.05)
Globulin: 3.3 g/dL (ref 1.9–3.7)
Glucose, Bld: 89 mg/dL (ref 65–99)
Potassium: 4.7 mmol/L (ref 3.5–5.3)
Sodium: 142 mmol/L (ref 135–146)
Total Bilirubin: 0.3 mg/dL (ref 0.2–1.2)
Total Protein: 7.4 g/dL (ref 6.1–8.1)
eGFR: 61 mL/min/{1.73_m2} (ref >=60)

## 2023-07-20 LAB — PARATHYROID HORMONE, INTACT (NO CA): PTH: 66 pg/mL (ref 16–77)

## 2023-07-20 LAB — VITAMIN D 25 HYDROXY (VIT D DEFICIENCY, FRACTURES): Vit D, 25-Hydroxy: 32 ng/mL (ref 30–100)

## 2023-07-20 LAB — PHOSPHORUS: Phosphorus: 3.7 mg/dL (ref 2.5–4.5)

## 2023-07-20 LAB — TSH: TSH: 0.85 mIU/L (ref 0.40–4.50)

## 2023-08-10 ENCOUNTER — Other Ambulatory Visit (HOSPITAL_COMMUNITY): Payer: Self-pay | Admitting: Psychiatry

## 2023-08-10 DIAGNOSIS — F4312 Post-traumatic stress disorder, chronic: Secondary | ICD-10-CM

## 2023-08-10 DIAGNOSIS — F331 Major depressive disorder, recurrent, moderate: Secondary | ICD-10-CM

## 2023-08-15 ENCOUNTER — Other Ambulatory Visit: Payer: Self-pay | Admitting: Family Medicine

## 2023-08-15 ENCOUNTER — Other Ambulatory Visit (HOSPITAL_COMMUNITY)
Admission: RE | Admit: 2023-08-15 | Discharge: 2023-08-15 | Disposition: A | Discharge status: Home / Self Care | Payer: Medicare Other | Source: Ambulatory Visit | Attending: Family Medicine | Admitting: Family Medicine

## 2023-08-15 DIAGNOSIS — I2510 Atherosclerotic heart disease of native coronary artery without angina pectoris: Secondary | ICD-10-CM | POA: Diagnosis not present

## 2023-08-15 DIAGNOSIS — Z01419 Encounter for gynecological examination (general) (routine) without abnormal findings: Secondary | ICD-10-CM | POA: Insufficient documentation

## 2023-08-15 DIAGNOSIS — Z0000 Encounter for general adult medical examination without abnormal findings: Secondary | ICD-10-CM | POA: Diagnosis not present

## 2023-08-15 DIAGNOSIS — Z1151 Encounter for screening for human papillomavirus (HPV): Secondary | ICD-10-CM | POA: Diagnosis not present

## 2023-08-15 DIAGNOSIS — E538 Deficiency of other specified B group vitamins: Secondary | ICD-10-CM | POA: Diagnosis not present

## 2023-08-15 DIAGNOSIS — Z79899 Other long term (current) drug therapy: Secondary | ICD-10-CM | POA: Diagnosis not present

## 2023-08-15 DIAGNOSIS — D472 Monoclonal gammopathy: Secondary | ICD-10-CM | POA: Diagnosis not present

## 2023-08-15 DIAGNOSIS — E210 Primary hyperparathyroidism: Secondary | ICD-10-CM | POA: Diagnosis not present

## 2023-08-15 DIAGNOSIS — Z23 Encounter for immunization: Secondary | ICD-10-CM | POA: Diagnosis not present

## 2023-08-17 ENCOUNTER — Ambulatory Visit: Payer: Medicare Other | Admitting: Rheumatology

## 2023-08-18 LAB — CYTOLOGY - PAP
Adequacy: ABSENT
Comment: NEGATIVE
Diagnosis: NEGATIVE
High risk HPV: NEGATIVE

## 2023-08-23 ENCOUNTER — Telehealth: Payer: Self-pay | Admitting: *Deleted

## 2023-08-23 DIAGNOSIS — R778 Other specified abnormalities of plasma proteins: Secondary | ICD-10-CM

## 2023-08-23 NOTE — Telephone Encounter (Signed)
Labs received from: [facility] at [facility].   Drawn on:08/17/2023  Reviewed by:Dr. Pollyann Savoy   Labs drawn: PTH, Intact, SPEP, Free Kappa/Light Chains Plus Ratio, Quant, Serum, CBC, CMP, Vitamin B12, TSH, Vitamin D, Lipid Panel  Results: M-Spike 0.6     F Kappa/Lambda Ratio, S 2.57    Hgb 10.4    Hct 34.4    MCV 75.3    MCH 22.8    MCHC 30.3    RDW 26.2    T. BIL 0.2    ALP 127    B12 132    Cholesterol 206    NHDL 150    LDL Chol Calc 128  Patient is on Fosamax 70 mg po once weekly.   Per Dr. Corliss Skains  abnormal IFE, needs hematology referral.  Called patient to advise IFE abnormal. Advised patient we will place referral to hematology. Patient expressed understanding.

## 2023-08-25 ENCOUNTER — Encounter: Payer: Self-pay | Admitting: Family Medicine

## 2023-08-25 ENCOUNTER — Telehealth: Payer: Self-pay | Admitting: Physician Assistant

## 2023-09-06 ENCOUNTER — Telehealth: Payer: Self-pay | Admitting: Nurse Practitioner

## 2023-09-12 ENCOUNTER — Encounter: Payer: Medicare Other | Admitting: Physician Assistant

## 2023-09-12 ENCOUNTER — Other Ambulatory Visit: Payer: Medicare Other

## 2023-09-13 ENCOUNTER — Inpatient Hospital Stay: Payer: Medicare Other | Attending: Physician Assistant

## 2023-09-13 ENCOUNTER — Inpatient Hospital Stay: Payer: Medicare Other

## 2023-09-13 ENCOUNTER — Inpatient Hospital Stay: Payer: Medicare Other | Admitting: Nurse Practitioner

## 2023-09-13 VITALS — BP 91/51 | HR 94 | Temp 98.0°F | Resp 18 | Ht 66.0 in | Wt 189.0 lb

## 2023-09-13 DIAGNOSIS — K921 Melena: Secondary | ICD-10-CM | POA: Diagnosis not present

## 2023-09-13 DIAGNOSIS — K648 Other hemorrhoids: Secondary | ICD-10-CM | POA: Diagnosis not present

## 2023-09-13 DIAGNOSIS — Z801 Family history of malignant neoplasm of trachea, bronchus and lung: Secondary | ICD-10-CM

## 2023-09-13 DIAGNOSIS — F1290 Cannabis use, unspecified, uncomplicated: Secondary | ICD-10-CM | POA: Insufficient documentation

## 2023-09-13 DIAGNOSIS — M797 Fibromyalgia: Secondary | ICD-10-CM

## 2023-09-13 DIAGNOSIS — I110 Hypertensive heart disease with heart failure: Secondary | ICD-10-CM | POA: Diagnosis not present

## 2023-09-13 DIAGNOSIS — K449 Diaphragmatic hernia without obstruction or gangrene: Secondary | ICD-10-CM | POA: Diagnosis not present

## 2023-09-13 DIAGNOSIS — K5730 Diverticulosis of large intestine without perforation or abscess without bleeding: Secondary | ICD-10-CM

## 2023-09-13 DIAGNOSIS — R7989 Other specified abnormal findings of blood chemistry: Secondary | ICD-10-CM | POA: Insufficient documentation

## 2023-09-13 DIAGNOSIS — I272 Pulmonary hypertension, unspecified: Secondary | ICD-10-CM

## 2023-09-13 DIAGNOSIS — J449 Chronic obstructive pulmonary disease, unspecified: Secondary | ICD-10-CM

## 2023-09-13 DIAGNOSIS — F17210 Nicotine dependence, cigarettes, uncomplicated: Secondary | ICD-10-CM

## 2023-09-13 DIAGNOSIS — I2510 Atherosclerotic heart disease of native coronary artery without angina pectoris: Secondary | ICD-10-CM | POA: Insufficient documentation

## 2023-09-13 DIAGNOSIS — F32A Depression, unspecified: Secondary | ICD-10-CM

## 2023-09-13 DIAGNOSIS — E538 Deficiency of other specified B group vitamins: Secondary | ICD-10-CM | POA: Diagnosis not present

## 2023-09-13 DIAGNOSIS — Z79899 Other long term (current) drug therapy: Secondary | ICD-10-CM | POA: Insufficient documentation

## 2023-09-13 DIAGNOSIS — D509 Iron deficiency anemia, unspecified: Secondary | ICD-10-CM | POA: Insufficient documentation

## 2023-09-13 DIAGNOSIS — Z833 Family history of diabetes mellitus: Secondary | ICD-10-CM | POA: Insufficient documentation

## 2023-09-13 DIAGNOSIS — M2550 Pain in unspecified joint: Secondary | ICD-10-CM

## 2023-09-13 DIAGNOSIS — Z9981 Dependence on supplemental oxygen: Secondary | ICD-10-CM

## 2023-09-13 DIAGNOSIS — I5032 Chronic diastolic (congestive) heart failure: Secondary | ICD-10-CM | POA: Insufficient documentation

## 2023-09-13 DIAGNOSIS — D472 Monoclonal gammopathy: Secondary | ICD-10-CM

## 2023-09-13 DIAGNOSIS — R5383 Other fatigue: Secondary | ICD-10-CM | POA: Insufficient documentation

## 2023-09-13 DIAGNOSIS — Z8249 Family history of ischemic heart disease and other diseases of the circulatory system: Secondary | ICD-10-CM

## 2023-09-13 DIAGNOSIS — Z9049 Acquired absence of other specified parts of digestive tract: Secondary | ICD-10-CM

## 2023-09-13 DIAGNOSIS — Z888 Allergy status to other drugs, medicaments and biological substances status: Secondary | ICD-10-CM | POA: Insufficient documentation

## 2023-09-13 LAB — CBC WITH DIFFERENTIAL (CANCER CENTER ONLY)
Abs Immature Granulocytes: 0.03 10*3/uL (ref 0.00–0.07)
Basophils Absolute: 0 10*3/uL (ref 0.0–0.1)
Basophils Relative: 0 %
Eosinophils Absolute: 0.1 10*3/uL (ref 0.0–0.5)
Eosinophils Relative: 2 %
HCT: 27 % — ABNORMAL LOW (ref 36.0–46.0)
Hemoglobin: 7.9 g/dL — ABNORMAL LOW (ref 12.0–15.0)
Immature Granulocytes: 0 %
Lymphocytes Relative: 17 %
Lymphs Abs: 1.3 10*3/uL (ref 0.7–4.0)
MCH: 23.7 pg — ABNORMAL LOW (ref 26.0–34.0)
MCHC: 29.3 g/dL — ABNORMAL LOW (ref 30.0–36.0)
MCV: 81.1 fL (ref 80.0–100.0)
Monocytes Absolute: 0.6 10*3/uL (ref 0.1–1.0)
Monocytes Relative: 7 %
Neutro Abs: 5.6 10*3/uL (ref 1.7–7.7)
Neutrophils Relative %: 74 %
Platelet Count: 239 10*3/uL (ref 150–400)
RBC: 3.33 MIL/uL — ABNORMAL LOW (ref 3.87–5.11)
RDW: 23.1 % — ABNORMAL HIGH (ref 11.5–15.5)
WBC Count: 7.6 10*3/uL (ref 4.0–10.5)
nRBC: 0.3 % — ABNORMAL HIGH (ref 0.0–0.2)

## 2023-09-13 MED ORDER — CYANOCOBALAMIN 1000 MCG/ML IJ SOLN
1000.0000 ug | Freq: Once | INTRAMUSCULAR | Status: AC
Start: 2023-09-13 — End: 2023-09-13
  Administered 2023-09-13: 1000 ug via INTRAMUSCULAR
  Filled 2023-09-13: qty 1

## 2023-09-13 MED ORDER — CYANOCOBALAMIN 1000 MCG/ML IJ SOLN: 1000.0000 ug | Freq: Once | INTRAMUSCULAR | Status: DC

## 2023-09-13 NOTE — Progress Notes (Addendum)
[facility]  [phone]   HEMATOLOGY ONCOLOGY CONSULTATION   Teresa Roberts  DOB: 05-05-1961  MR#: [id number]  CSN#: [id number]     Patient Care Team: Mila Palmer, MD as PCP - General (Family Medicine) Jodelle Red, MD as PCP - Cardiology (Cardiology) Carlean Jews, NP as Nurse Practitioner (Hematology and Oncology)  Reason for consult: abnormal SPEP, possible MGUS, microcytic anemia  History of present illness:   patient recently had abnormal labs with regular check up. She was found to have microcytic anemia, low B12 level, multiple Myeloma panel showed slightly elevated M protein spike. The kappa/lamda light chain ratio was also slightly elevated. She had normal thyroid and parathyroid hormone levels. She has history of fibromyalgia and sciatic nerve disorder.  She has significant COPD. She is using oxygen at 5 LPM in the office. She states that she uses 8 lpm at home. She does see pulmonology on routine basis. She uses inhalers as prescribed.  She reports fatigue and generalized joint pain, but nothing out of the normal range for her. She does have shortness of breath, especially with exertion.  She had upper endoscopy 10/31/2021 which showed medium sized hiatal hernia and gastritis. Colonoscopy done the same day showed internal hemorrhoids, diverticulosis, and a single 3 mm polyp. Preparation of the colon was poor, and a repeat colonoscopy was recommended for 3 months. The patient states that she has been recommended to see her GI provider due to anemia, however, she states that she needs to make an appointment with a new one.  Today, she presents to the clinic alone. She denies chest pain, chest pressure, or unusual shortness of breath. She denies headaches or visual disturbances. She denies abdominal pain, nausea, vomiting, or changes in bowel or bladder habits.  She has noted some bright red blood in the stool which is intermittent.   MEDICAL  HISTORY:  Past Medical History:  Diagnosis Date   CAD (coronary artery disease)    a. 01/2017: cath showing 20% mid RCA stenosis and otherwise normal LAD and LCx with a preserved EF of 55 to 60%; mod pulmonary HTN   Carpal tunnel syndrome    COPD (chronic obstructive pulmonary disease) (HCC)    O2 dependent   cpap    Depression    PTSD   Diastolic CHF (HCC)    Fibromyalgia    Hyperlipidemia    Hypertension    Migraines    Osteoporosis    Sciatic nerve disease     SURGICAL HISTORY: Past Surgical History:  Procedure Laterality Date   APPENDECTOMY     BIOPSY  01/01/2021   Procedure: BIOPSY;  Surgeon: Charlott Rakes, MD;  Location: [facility];  Service: Endoscopy;;   CESAREAN SECTION     COLONOSCOPY WITH PROPOFOL N/A 01/01/2021   Procedure: COLONOSCOPY WITH PROPOFOL;  Surgeon: Charlott Rakes, MD;  Location: [facility];  Service: Endoscopy;  Laterality: N/A;   ESOPHAGOGASTRODUODENOSCOPY (EGD) WITH PROPOFOL N/A 01/01/2021   Procedure: ESOPHAGOGASTRODUODENOSCOPY (EGD) WITH PROPOFOL;  Surgeon: Charlott Rakes, MD;  Location: [facility];  Service: Endoscopy;  Laterality: N/A;   ORIF ANKLE FRACTURE Left 08/12/2017   Procedure: OPEN REDUCTION INTERNAL FIXATION (ORIF) ANKLE FRACTURE;  Surgeon: Samson Frederic, MD;  Location: [facility] OR;  Service: Orthopedics;  Laterality: Left;   RIGHT HEART CATH N/A 07/19/2018   Procedure: RIGHT HEART CATH;  Surgeon: Dolores Patty, MD;  Location: [facility] INVASIVE CV LAB;  Service: Cardiovascular;  Laterality: N/A;   RIGHT/LEFT HEART CATH AND CORONARY ANGIOGRAPHY  N/A 01/27/2017   Procedure: Right/Left Heart Cath and Coronary Angiography;  Surgeon: Lennette Bihari, MD;  Location: [facility] INVASIVE CV LAB;  Service: Cardiovascular;  Laterality: N/A;    SOCIAL HISTORY: Social History   Socioeconomic History   Marital status: Married    Spouse name: Not on file   Number of children: Not on file   Years of education: Not on file   Highest education level:  Not on file  Occupational History   Not on file  Tobacco Use   Smoking status: Some Days    Current packs/day: 0.25    Average packs/day: 0.3 packs/day for 44.8 years (11.2 ttl pk-yrs)    Types: Cigarettes    Start date: 44    Passive exposure: Never   Smokeless tobacco: Never  Vaping Use   Vaping status: Never Used  Substance and Sexual Activity   Alcohol use: No   Drug use: Yes    Types: Marijuana    Comment: every once in awhile   Sexual activity: Not Currently  Other Topics Concern   Not on file  Social History Narrative   Not on file   Social Determinants of Health   Financial Resource Strain: Not on file  Food Insecurity: Not on file  Transportation Needs: Not on file  Physical Activity: Not on file  Stress: Not on file  Social Connections: Not on file  Intimate Partner Violence: Not on file    FAMILY HISTORY: Family History  Problem Relation Age of Onset   Diabetes Mother    Hypertension Mother    CAD Mother    Diabetes Father    Lung cancer Father    Diabetes Sister    Hypercalcemia Neg Hx    Breast cancer Neg Hx     ALLERGIES:  is allergic to citrus and lisinopril.  MEDICATIONS:  Current Outpatient Medications  Medication Sig Dispense Refill   albuterol (PROVENTIL) (2.5 MG/3ML) 0.083% nebulizer solution Take 3 mLs (2.5 mg total) by nebulization every 6 (six) hours as needed for wheezing or shortness of breath. 360 mL 5   albuterol (VENTOLIN HFA) 108 (90 Base) MCG/ACT inhaler Inhale 2 puffs into the lungs every 6 (six) hours as needed for wheezing or shortness of breath. 18 g 5   alendronate (FOSAMAX) 70 MG tablet Take 1 tablet (70 mg total) by mouth once a week. Take with a full glass of water on an empty stomach. 12 tablet 0   ARIPiprazole (ABILIFY) 5 MG tablet Take 1/2 tab for I week and than full tab daily 30 tablet 1   aspirin 81 MG EC tablet Take 81 mg by mouth daily after lunch.  0   busPIRone (BUSPAR) 10 MG tablet Take 1 tablet (10 mg total)  by mouth 3 (three) times daily. 90 tablet 1   dorzolamide-timolol (COSOPT) 22.3-6.8 MG/ML ophthalmic solution Place 1 drop into both eyes 2 (two) times daily.     DULoxetine (CYMBALTA) 30 MG capsule Take 30 mg by mouth daily.     DULoxetine (CYMBALTA) 60 MG capsule TAKE 1 CAPSULE BY MOUTH EVERY DAY 90 capsule 3   esomeprazole (NEXIUM) 40 MG capsule Take 40 mg by mouth as needed.     furosemide (LASIX) 40 MG tablet Take 1 tablet (40 mg total) by mouth daily. 90 tablet 3   gabapentin (NEURONTIN) 800 MG tablet Take 800 mg by mouth 2 (two) times daily.     latanoprost (XALATAN) 0.005 % ophthalmic solution Place 1 drop into both  eyes at bedtime.     metoprolol succinate (TOPROL XL) 25 MG 24 hr tablet Take 1 tablet (25 mg total) by mouth daily. 90 tablet 3   OXYGEN Inhale 4 L into the lungs continuous.     pregabalin (LYRICA) 75 MG capsule Take 1 capsule (75 mg total) by mouth 2 (two) times daily. 60 capsule 6   rizatriptan (MAXALT-MLT) 10 MG disintegrating tablet Take 10 mg by mouth as needed for migraine. May repeat in 2 hours if needed     simvastatin (ZOCOR) 10 MG tablet Take 10 mg by mouth at bedtime.     tiZANidine (ZANAFLEX) 4 MG tablet TAKE 1 TABLET BY MOUTH EVERY DAY AT BEDTIME AS NEEDED FOR MUSCLE SPASMS 30 tablet 2   topiramate (TOPAMAX) 25 MG tablet Take 50 mg by mouth as needed.     traZODone (DESYREL) 150 MG tablet Take 1 tablet (150 mg total) by mouth at bedtime. 30 tablet 1   TRELEGY ELLIPTA 100-62.5-25 MCG/ACT AEPB TAKE 1 PUFF BY MOUTH EVERY DAY 60 each 5   Vitamin D, Ergocalciferol, (DRISDOL) 1.25 MG (50000 UNIT) CAPS capsule Take 50,000 Units by mouth once a week.     No current facility-administered medications for this visit.    REVIEW OF SYSTEMS:   Constitutional: Denies fevers, chills or abnormal night sweats. She does have chronic fatigue.  Eyes: Denies blurriness of vision, double vision or watery eyes Ears, nose, mouth, throat, and face: Denies mucositis or sore  throat Respiratory: Denies cough. She does have shortness of breath which is worse with exertion.  Cardiovascular: Denies palpitation, chest discomfort or lower extremity swelling Gastrointestinal:  Denies nausea, heartburn or change in bowel habits. Has noted blood in stool. This is intermittent and bright red when noticed.  Skin: Denies abnormal skin rashes Lymphatics: Denies new lymphadenopathy or easy bruising Neurological:Denies numbness, tingling or new weaknesses Behavioral/Psych: Mood is stable, no new changes  All other systems were reviewed with the patient and are negative.  PHYSICAL EXAMINATION: ECOG PERFORMANCE STATUS: 1 - Symptomatic but completely ambulatory  Vitals:   09/13/23 1440  BP: (!) 91/51  Pulse: 94  Resp: 18  Temp: 98 F (36.7 C)  SpO2: 94%   Filed Weights   09/13/23 1440  Weight: 189 lb (85.7 kg)    GENERAL:alert, no distress and comfortable SKIN: skin color, texture, turgor are normal, no rashes or significant lesions EYES: normal, conjunctiva are pink and non-injected, sclera clear OROPHARYNX:no exudate, no erythema and lips, buccal mucosa, and tongue normal  NECK: supple, thyroid normal size, non-tender, without nodularity LYMPH:  no palpable lymphadenopathy in the cervical, axillary or inguinal LUNGS: normal breathing effort. She does have moderate wheezing with inspiration and expiration. Currently on home oxygen at 5 lpm.  HEART: regular rate & rhythm and no murmurs and no lower extremity edema ABDOMEN:abdomen soft, non-tender and normal bowel sounds Musculoskeletal:no cyanosis of digits and no clubbing  PSYCH: alert & oriented x 3 with fluent speech NEURO: no focal motor/sensory deficits  LABORATORY DATA:  I have reviewed the data as listed Lab Results  Component Value Date   WBC 7.6 09/13/2023   HGB 7.9 (L) 09/13/2023   HCT 27.0 (L) 09/13/2023   MCV 81.1 09/13/2023   PLT 239 09/13/2023   Recent Labs    07/13/23 1337  NA 142  K  4.7  CL 102  CO2 30  GLUCOSE 89  BUN 11  CREATININE 1.04  CALCIUM 10.1  PROT 7.2  7.4  AST  13  ALT 7  BILITOT 0.3     ASSESSMENT & PLAN:  MGUS (monoclonal gammopathy of unknown significance) Assessment & Plan: Lab work done on 08/15/2023 showed slight elevation of M spike protein and kappa/lamda light chain ratio. Recheck multiple myeloma panel and serum light chains for further evaluation. Will also check check 24 hour urine for light chain proteins. A bone survey was also ordered.   Orders: -     CBC with Differential (Cancer Center Only); Future -     Multiple Myeloma Panel (SPEP&IFE w/QIG); Future -     Kappa/Lambda Light Chains, Free, With Ratio, 24Hr. Urine; Future -     DG Bone Survey Met; Future -     Intrinsic Factor Antibodies; Future -     Iron and Iron Binding Capacity ([facility] only); Future  B12 deficiency Assessment & Plan: Labs done 08/15/2023 showed B12 level at 134. A b12 injection was given during today's visit. Check blood count and intrinsic factor for further evaluation.    Microcytic anemia Assessment & Plan: Labs checked 08/15/2023 did show microcytic anemia with unclear cause. Recheck cbc along with iron study and intrinsic factor for further evaluation.     Of note - patient's CBC back prior to endo of day did reveal Hgb of 7.9. will contact patient to inquire about willingness to receive blood transfusion and/or iron infusion. Will arrange for type and cross and transfusion/infusion appointment. Awaiting iron study results.    All questions were answered. The patient knows to call the clinic with any problems, questions or concerns.      Carlean Jews, NP 09/13/2023 4:52 PM  Addendum  I have seen the patient, examined her. I agree with the assessment and and plan and have edited the notes.   62 yo female with PMH of hypertension, CAD, COPD on continuous oxygen, fibromyalgia, presented with positive M protein, microcytic anemia and mild  hypercalcemia with normal TSH.  She was found to have B12 deficiency on routine lab last month.  She has not received any B12 supplement, will give her her B12 injection today and let her start oral B12.  Will repeat today multiple myeloma panel, 24-hour urine UPEP and light chain, and obtain bone survey.  Will also do anemia workup including ferritin and TIBC, intrinsic factor antibody to rule out pernicious anemia.  Will call her with the lab results, and decide if she needs a bone marrow biopsy.  All questions were answered.  I spent a total of 30 minutes for her visit today, more than 50% time on face-to-face counseling.  Malachy Mood MD 09/13/2023

## 2023-09-13 NOTE — Assessment & Plan Note (Signed)
Lab work done on 08/15/2023 showed slight elevation of M spike protein and kappa/lamda light chain ratio. Recheck multiple myeloma panel and serum light chains for further evaluation. Will also check check 24 hour urine for light chain proteins. A bone survey was also ordered.

## 2023-09-13 NOTE — Assessment & Plan Note (Signed)
Labs done 08/15/2023 showed B12 level at 134. A b12 injection was given during today's visit. Check blood count and intrinsic factor for further evaluation.

## 2023-09-13 NOTE — Assessment & Plan Note (Signed)
Labs checked 08/15/2023 did show microcytic anemia with unclear cause. Recheck cbc along with iron study and intrinsic factor for further evaluation.

## 2023-09-14 ENCOUNTER — Encounter: Payer: Self-pay | Admitting: Hematology

## 2023-09-14 ENCOUNTER — Telehealth: Payer: Self-pay | Admitting: Nurse Practitioner

## 2023-09-14 LAB — IRON AND IRON BINDING CAPACITY (CC-WL,HP ONLY)
Iron: 14 ug/dL — ABNORMAL LOW (ref 28–170)
Saturation Ratios: 2 % — ABNORMAL LOW (ref 10.4–31.8)
TIBC: 584 ug/dL — ABNORMAL HIGH (ref 250–450)
UIBC: 570 ug/dL — ABNORMAL HIGH (ref 148–442)

## 2023-09-14 LAB — INTRINSIC FACTOR ANTIBODIES: Intrinsic Factor: 16.9 [AU]/ml — ABNORMAL HIGH (ref 0.0–1.1)

## 2023-09-14 NOTE — Telephone Encounter (Signed)
Scheduled appointments per referral. Patient is aware of the appointment time and date as well as the address. Patient was informed to arrive 10-15 minutes prior with updated insurance information. All questions were answered.

## 2023-09-15 ENCOUNTER — Other Ambulatory Visit (HOSPITAL_COMMUNITY): Payer: Self-pay | Admitting: Psychiatry

## 2023-09-15 ENCOUNTER — Other Ambulatory Visit: Payer: Self-pay | Admitting: *Deleted

## 2023-09-15 DIAGNOSIS — I2510 Atherosclerotic heart disease of native coronary artery without angina pectoris: Secondary | ICD-10-CM | POA: Diagnosis not present

## 2023-09-15 DIAGNOSIS — Z888 Allergy status to other drugs, medicaments and biological substances status: Secondary | ICD-10-CM | POA: Diagnosis not present

## 2023-09-15 DIAGNOSIS — K449 Diaphragmatic hernia without obstruction or gangrene: Secondary | ICD-10-CM | POA: Diagnosis not present

## 2023-09-15 DIAGNOSIS — I110 Hypertensive heart disease with heart failure: Secondary | ICD-10-CM | POA: Diagnosis not present

## 2023-09-15 DIAGNOSIS — Z79899 Other long term (current) drug therapy: Secondary | ICD-10-CM | POA: Diagnosis not present

## 2023-09-15 DIAGNOSIS — D509 Iron deficiency anemia, unspecified: Secondary | ICD-10-CM | POA: Diagnosis not present

## 2023-09-15 DIAGNOSIS — K648 Other hemorrhoids: Secondary | ICD-10-CM | POA: Diagnosis not present

## 2023-09-15 DIAGNOSIS — Z9981 Dependence on supplemental oxygen: Secondary | ICD-10-CM | POA: Diagnosis not present

## 2023-09-15 DIAGNOSIS — I5032 Chronic diastolic (congestive) heart failure: Secondary | ICD-10-CM | POA: Diagnosis not present

## 2023-09-15 DIAGNOSIS — E538 Deficiency of other specified B group vitamins: Secondary | ICD-10-CM | POA: Diagnosis not present

## 2023-09-15 DIAGNOSIS — I272 Pulmonary hypertension, unspecified: Secondary | ICD-10-CM | POA: Diagnosis not present

## 2023-09-15 DIAGNOSIS — F4312 Post-traumatic stress disorder, chronic: Secondary | ICD-10-CM

## 2023-09-15 DIAGNOSIS — R7989 Other specified abnormal findings of blood chemistry: Secondary | ICD-10-CM | POA: Diagnosis not present

## 2023-09-15 DIAGNOSIS — M2550 Pain in unspecified joint: Secondary | ICD-10-CM | POA: Diagnosis not present

## 2023-09-15 DIAGNOSIS — D472 Monoclonal gammopathy: Secondary | ICD-10-CM | POA: Diagnosis not present

## 2023-09-15 DIAGNOSIS — F17210 Nicotine dependence, cigarettes, uncomplicated: Secondary | ICD-10-CM | POA: Diagnosis not present

## 2023-09-15 DIAGNOSIS — K921 Melena: Secondary | ICD-10-CM | POA: Diagnosis not present

## 2023-09-15 DIAGNOSIS — F331 Major depressive disorder, recurrent, moderate: Secondary | ICD-10-CM

## 2023-09-15 DIAGNOSIS — K5730 Diverticulosis of large intestine without perforation or abscess without bleeding: Secondary | ICD-10-CM | POA: Diagnosis not present

## 2023-09-15 DIAGNOSIS — M797 Fibromyalgia: Secondary | ICD-10-CM | POA: Diagnosis not present

## 2023-09-15 DIAGNOSIS — R5383 Other fatigue: Secondary | ICD-10-CM | POA: Diagnosis not present

## 2023-09-15 DIAGNOSIS — Z9049 Acquired absence of other specified parts of digestive tract: Secondary | ICD-10-CM | POA: Diagnosis not present

## 2023-09-15 DIAGNOSIS — J449 Chronic obstructive pulmonary disease, unspecified: Secondary | ICD-10-CM | POA: Diagnosis not present

## 2023-09-16 LAB — MULTIPLE MYELOMA PANEL, SERUM
Albumin SerPl Elph-Mcnc: 3.6 g/dL (ref 2.9–4.4)
Albumin/Glob SerPl: 1.3 (ref 0.7–1.7)
Alpha 1: 0.3 g/dL (ref 0.0–0.4)
Alpha2 Glob SerPl Elph-Mcnc: 0.6 g/dL (ref 0.4–1.0)
B-Globulin SerPl Elph-Mcnc: 1.2 g/dL (ref 0.7–1.3)
Gamma Glob SerPl Elph-Mcnc: 1 g/dL (ref 0.4–1.8)
Globulin, Total: 3 g/dL (ref 2.2–3.9)
IgA: 214 mg/dL (ref 87–352)
IgG (Immunoglobin G), Serum: 877 mg/dL (ref 586–1602)
IgM (Immunoglobulin M), Srm: 137 mg/dL (ref 26–217)
Total Protein ELP: 6.6 g/dL (ref 6.0–8.5)

## 2023-09-19 LAB — KAPPA/LAMBDA LIGHT CHAINS, FREE, WITH RATIO, 24HR. URINE
FR KAPPA LT CH,24HR: 28.99 mg/(24.h)
FR LAMBDA LT CH,24HR: 3.26 mg/(24.h)
Free Kappa Lt Chains,Ur: 17.05 mg/L (ref 1.17–86.46)
Free Kappa/Lambda Ratio: 8.88 (ref 1.83–14.26)
Free Lambda Lt Chains,Ur: 1.92 mg/L (ref 0.27–15.21)
Total Volume: 1700

## 2023-09-22 ENCOUNTER — Telehealth: Payer: Self-pay | Admitting: Nurse Practitioner

## 2023-09-22 NOTE — Progress Notes (Signed)
Good morning.  Will you check with the patient to see if she has been contacted to schedule the additional labs. She is also likely to need blood/iron. I can't find where she has been contacted and if she is set up for these appointments yet. Thank you.  Herbert Seta

## 2023-09-23 ENCOUNTER — Telehealth: Payer: Self-pay

## 2023-09-23 ENCOUNTER — Other Ambulatory Visit: Payer: Medicare Other

## 2023-09-23 ENCOUNTER — Other Ambulatory Visit: Payer: Self-pay | Admitting: Nurse Practitioner

## 2023-09-23 NOTE — Telephone Encounter (Signed)
Teresa Roberts, patient will be scheduled as soon as possible.  Auth Submission: NO AUTH NEEDED Site of care: Site of care: [facility] Payer: [employer] Medicare Medication & CPT/J Code(s) submitted: Feraheme (ferumoxytol) [id number] Route of submission (phone, fax, portal): portal Phone # Fax # Auth type: Buy/Bill PB Units/visits requested: 510mg  x 2 doses Reference number: [id number] Approval from: 09/23/23 to 11/15/23   I confirmed on the [facility] portal that Feraheme does not need a prior authorization for this patient.

## 2023-09-23 NOTE — Telephone Encounter (Signed)
Wonderful. Thank you so much.  Herbert Seta, NP

## 2023-09-23 NOTE — Progress Notes (Signed)
I checked with Delice Bison and patient's insurance prefers fereheme. I added the orders for [facility] infusion center for her. Thank you for letting her know.  Herbert Seta

## 2023-09-26 ENCOUNTER — Other Ambulatory Visit: Payer: Self-pay | Admitting: Nurse Practitioner

## 2023-09-26 ENCOUNTER — Other Ambulatory Visit: Payer: Self-pay

## 2023-09-26 ENCOUNTER — Inpatient Hospital Stay: Payer: Medicare Other

## 2023-09-26 ENCOUNTER — Inpatient Hospital Stay: Payer: Medicare Other | Attending: Physician Assistant

## 2023-09-26 ENCOUNTER — Telehealth: Payer: Self-pay

## 2023-09-26 DIAGNOSIS — Z79899 Other long term (current) drug therapy: Secondary | ICD-10-CM | POA: Diagnosis not present

## 2023-09-26 DIAGNOSIS — Z888 Allergy status to other drugs, medicaments and biological substances status: Secondary | ICD-10-CM | POA: Diagnosis not present

## 2023-09-26 DIAGNOSIS — R7989 Other specified abnormal findings of blood chemistry: Secondary | ICD-10-CM | POA: Insufficient documentation

## 2023-09-26 DIAGNOSIS — D509 Iron deficiency anemia, unspecified: Secondary | ICD-10-CM

## 2023-09-26 DIAGNOSIS — R5383 Other fatigue: Secondary | ICD-10-CM | POA: Diagnosis not present

## 2023-09-26 DIAGNOSIS — R519 Headache, unspecified: Secondary | ICD-10-CM | POA: Diagnosis not present

## 2023-09-26 DIAGNOSIS — D472 Monoclonal gammopathy: Secondary | ICD-10-CM

## 2023-09-26 LAB — CBC WITH DIFFERENTIAL (CANCER CENTER ONLY)
Abs Immature Granulocytes: 0.03 10*3/uL (ref 0.00–0.07)
Basophils Absolute: 0 10*3/uL (ref 0.0–0.1)
Basophils Relative: 0 %
Eosinophils Absolute: 0.2 10*3/uL (ref 0.0–0.5)
Eosinophils Relative: 2 %
HCT: 22.7 % — ABNORMAL LOW (ref 36.0–46.0)
Hemoglobin: 6.6 g/dL — CL (ref 12.0–15.0)
Immature Granulocytes: 0 %
Lymphocytes Relative: 13 %
Lymphs Abs: 1.2 10*3/uL (ref 0.7–4.0)
MCH: 23.1 pg — ABNORMAL LOW (ref 26.0–34.0)
MCHC: 29.1 g/dL — ABNORMAL LOW (ref 30.0–36.0)
MCV: 79.4 fL — ABNORMAL LOW (ref 80.0–100.0)
Monocytes Absolute: 0.6 10*3/uL (ref 0.1–1.0)
Monocytes Relative: 6 %
Neutro Abs: 7.2 10*3/uL (ref 1.7–7.7)
Neutrophils Relative %: 79 %
Platelet Count: 156 10*3/uL (ref 150–400)
RBC: 2.86 MIL/uL — ABNORMAL LOW (ref 3.87–5.11)
RDW: 21.3 % — ABNORMAL HIGH (ref 11.5–15.5)
WBC Count: 9.2 10*3/uL (ref 4.0–10.5)
nRBC: 0.4 % — ABNORMAL HIGH (ref 0.0–0.2)

## 2023-09-26 LAB — CMP (CANCER CENTER ONLY)
ALT: 8 U/L (ref 0–44)
AST: 17 U/L (ref 15–41)
Albumin: 3.7 g/dL (ref 3.5–5.0)
Alkaline Phosphatase: 103 U/L (ref 38–126)
Anion gap: 3 — ABNORMAL LOW (ref 5–15)
BUN: 11 mg/dL (ref 8–23)
CO2: 32 mmol/L (ref 22–32)
Calcium: 8.9 mg/dL (ref 8.9–10.3)
Chloride: 104 mmol/L (ref 98–111)
Creatinine: 0.97 mg/dL (ref 0.44–1.00)
GFR, Estimated: >60 mL/min (ref >60)
Glucose, Bld: 94 mg/dL (ref 70–99)
Potassium: 5.3 mmol/L — ABNORMAL HIGH (ref 3.5–5.1)
Sodium: 139 mmol/L (ref 135–145)
Total Bilirubin: 0.2 mg/dL (ref <1.2)
Total Protein: 6.7 g/dL (ref 6.5–8.1)

## 2023-09-26 LAB — ABO/RH: ABO/RH(D): A POS

## 2023-09-26 LAB — RETIC PANEL
Immature Retic Fract: 22.5 % — ABNORMAL HIGH (ref 2.3–15.9)
RBC.: 2.93 MIL/uL — ABNORMAL LOW (ref 3.87–5.11)
Retic Count, Absolute: 53.9 10*3/uL (ref 19.0–186.0)
Retic Ct Pct: 1.8 % (ref 0.4–3.1)
Reticulocyte Hemoglobin: 15.1 pg — ABNORMAL LOW (ref >27.9)

## 2023-09-26 LAB — PREPARE RBC (CROSSMATCH)

## 2023-09-26 MED ORDER — SODIUM CHLORIDE 0.9% IV SOLUTION
250.0000 mL | INTRAVENOUS | Status: DC
  Administered 2023-09-26: 250 mL via INTRAVENOUS

## 2023-09-26 MED ORDER — DIPHENHYDRAMINE HCL 25 MG PO CAPS
25.0000 mg | ORAL_CAPSULE | Freq: Once | ORAL | Status: AC
  Administered 2023-09-26: 25 mg via ORAL
  Filled 2023-09-26: qty 1

## 2023-09-26 MED ORDER — ACETAMINOPHEN 325 MG PO TABS
650.0000 mg | ORAL_TABLET | Freq: Once | ORAL | Status: AC
  Administered 2023-09-26: 650 mg via ORAL
  Filled 2023-09-26: qty 2

## 2023-09-26 NOTE — Progress Notes (Signed)
Blood could not be transfused due to time limit. Pt was rescheduled for 10 am 09/27/2023 in [facility] for 2 units. Pt verbalized understanding. Provider made aware to put in an additional unit to transfuse.

## 2023-09-26 NOTE — Telephone Encounter (Signed)
Critical lab value reported:  Hbg 6.6 today w/BB Hold.  Notified [facility] and Rodman Key, NP

## 2023-09-26 NOTE — Addendum Note (Signed)
Addended by: Vincent Gros on: 09/26/2023 04:33 PM   Modules accepted: Orders

## 2023-09-27 ENCOUNTER — Other Ambulatory Visit: Payer: Self-pay | Admitting: Nurse Practitioner

## 2023-09-27 ENCOUNTER — Inpatient Hospital Stay: Payer: Medicare Other

## 2023-09-27 DIAGNOSIS — Z79899 Other long term (current) drug therapy: Secondary | ICD-10-CM | POA: Diagnosis not present

## 2023-09-27 DIAGNOSIS — D509 Iron deficiency anemia, unspecified: Secondary | ICD-10-CM

## 2023-09-27 DIAGNOSIS — R519 Headache, unspecified: Secondary | ICD-10-CM | POA: Diagnosis not present

## 2023-09-27 DIAGNOSIS — R7989 Other specified abnormal findings of blood chemistry: Secondary | ICD-10-CM | POA: Diagnosis not present

## 2023-09-27 DIAGNOSIS — Z888 Allergy status to other drugs, medicaments and biological substances status: Secondary | ICD-10-CM | POA: Diagnosis not present

## 2023-09-27 DIAGNOSIS — R5383 Other fatigue: Secondary | ICD-10-CM | POA: Diagnosis not present

## 2023-09-27 LAB — FERRITIN: Ferritin: 7 ng/mL — ABNORMAL LOW (ref 11–307)

## 2023-09-27 MED ORDER — SODIUM CHLORIDE 0.9% IV SOLUTION
250.0000 mL | INTRAVENOUS | Status: AC
  Administered 2023-09-27: 250 mL via INTRAVENOUS

## 2023-09-27 MED ORDER — SODIUM CHLORIDE 0.9% FLUSH: 3.0000 mL | INTRAVENOUS | Status: AC | PRN

## 2023-09-27 MED ORDER — DIPHENHYDRAMINE HCL 25 MG PO CAPS
25.0000 mg | ORAL_CAPSULE | Freq: Once | ORAL | Status: AC
  Administered 2023-09-27: 25 mg via ORAL
  Filled 2023-09-27: qty 1

## 2023-09-27 MED ORDER — SODIUM CHLORIDE 0.9% IV SOLUTION
250.0000 mL | INTRAVENOUS | Status: AC
Start: 2023-09-27 — End: ?
  Administered 2023-09-27: 250 mL via INTRAVENOUS

## 2023-09-27 MED ORDER — ACETAMINOPHEN 325 MG PO TABS
650.0000 mg | ORAL_TABLET | Freq: Once | ORAL | Status: AC
  Administered 2023-09-27: 650 mg via ORAL
  Filled 2023-09-27: qty 2

## 2023-09-27 NOTE — Patient Instructions (Signed)

## 2023-09-28 LAB — TYPE AND SCREEN
ABO/RH(D): A POS
Antibody Screen: NEGATIVE
Unit division: 0
Unit division: 0

## 2023-09-28 LAB — BPAM RBC
Blood Product Expiration Date: 202412082359
Blood Product Expiration Date: 202412092359
ISSUE DATE / TIME: 202411121113
ISSUE DATE / TIME: 202411121113
Unit Type and Rh: 6200
Unit Type and Rh: 202412082359

## 2023-09-28 LAB — PREPARE RBC (CROSSMATCH)

## 2023-09-29 ENCOUNTER — Encounter: Payer: Self-pay | Admitting: Nurse Practitioner

## 2023-09-29 ENCOUNTER — Inpatient Hospital Stay: Payer: Medicare Other | Admitting: Nurse Practitioner

## 2023-09-29 DIAGNOSIS — D509 Iron deficiency anemia, unspecified: Secondary | ICD-10-CM | POA: Diagnosis not present

## 2023-09-29 LAB — SAMPLE TO BLOOD BANK

## 2023-09-29 NOTE — Assessment & Plan Note (Addendum)
Labs checked at initial visit indicated Hgb 7.9.  upon recheck 09/26/2023, Hgb was 6.6. her ferritin was 7. Was was seen in symptom management clinic. Received 2 units of pRBCs. She is scheduled to have two treatments of IV iron over next 2 weeks.

## 2023-09-29 NOTE — Progress Notes (Signed)
I connected with Teresa Roberts on 09/29/23 at  1:00 PM EST by telephone and verified that I am speaking with the correct person using two identifiers.   I discussed the limitations, risks, security and privacy concerns of performing an evaluation and management service by telemedicine and the availability of in-person appointments. I also discussed with the patient that there may be a patient responsible charge related to this service. The patient expressed understanding and agreed to proceed.   Other persons participating in the visit and their role in the encounter: none   Patient's location: home Provider's location: [facility] at [facility]   Chief Complaint: iron deficiency anemia   Patient Care Team: Mila Palmer, MD as PCP - General (Family Medicine) Jodelle Red, MD as PCP - Cardiology (Cardiology) Carlean Jews, NP as Nurse Practitioner (Hematology and Oncology)  Clinic Day:  09/29/2023  Referring physician: Mila Palmer, MD  ASSESSMENT & PLAN:   Assessment & Plan: Microcytic anemia Labs checked at initial visit indicated Hgb 7.9.  upon recheck 09/26/2023, Hgb was 6.6. her ferritin was 7. Was was seen in symptom management clinic. Received 2 units of pRBCs. She is scheduled to have two treatments of IV iron over next 2 weeks.    Plan:  Labs reviewed from 09/26/2023 -CBC showing WBC 9.2; Hgb 6.6; Hct 22.7; Plt 156; Anc 7.2 -CMP - K 5.3; glucose 94; BUN 11; Creatinine 0.97; eGFR >60; Ca 8.9; LFTs normal.   -ferritin 7; retic hemoglobin 15.1; immature retic frac 22.5; abs. retic count 53.9 -09/13/2023 - 24 hour urine for free light chains - normal  -09/15/2023 - multiple Myeloma panel - negarive for M protein spike. IV iron treatments at [employer] as scheduled  Labs with follow up 3 weeks after last IV iron treatment.  The patient understands the plans discussed today and is in agreement with them.  She knows to contact our  office if she develops concerns prior to her next appointment.  I provided 10 minutes of face-to-face time during this encounter and > 50% was spent counseling as documented under my assessment and plan.    Carlean Jews, NP  [facility] - A DEPT OF [id number] [facility] [address] [place] [address] Dept: [phone] Dept Fax: [phone]   No orders of the defined types were placed in this encounter.     CHIEF COMPLAINT:  CC: microcytic anemia from chronic blood loss.   Current Treatment:  red blood cells and IV iron as needed   INTERVAL HISTORY:  Teresa Roberts is here today for repeat clinical assessment.  Earlier this week she had 2 units of packed red blood cells.  She reports feeling fatigued with moderate headache.  Has taken Tylenol without relief.  Resting.  Denies negative side effects from the transfusion.  She denies chest pain, chest pressure, or shortness of breath. She denies headaches or visual disturbances. He denies abdominal pain, nausea, vomiting, or changes in bowel or bladder habits.  She denies fevers or chills. She denies pain. Her appetite is good. Her weight has been stable.  I have reviewed the past medical history, past surgical history, social history and family history with the patient and they are unchanged from previous note.  ALLERGIES:  is allergic to citrus and lisinopril.  MEDICATIONS:  Current Outpatient Medications  Medication Sig Dispense Refill   albuterol (PROVENTIL) (2.5 MG/3ML) 0.083% nebulizer solution Take 3 mLs (2.5 mg  total) by nebulization every 6 (six) hours as needed for wheezing or shortness of breath. 360 mL 5   albuterol (VENTOLIN HFA) 108 (90 Base) MCG/ACT inhaler Inhale 2 puffs into the lungs every 6 (six) hours as needed for wheezing or shortness of breath. 18 g 5   alendronate (FOSAMAX) 70 MG tablet Take 1 tablet (70 mg total) by mouth once a week. Take with a full  glass of water on an empty stomach. 12 tablet 0   ARIPiprazole (ABILIFY) 5 MG tablet Take 1/2 tab for I week and than full tab daily 30 tablet 1   aspirin 81 MG EC tablet Take 81 mg by mouth daily after lunch.  0   busPIRone (BUSPAR) 10 MG tablet Take 1 tablet (10 mg total) by mouth 3 (three) times daily. 90 tablet 1   dorzolamide-timolol (COSOPT) 22.3-6.8 MG/ML ophthalmic solution Place 1 drop into both eyes 2 (two) times daily.     DULoxetine (CYMBALTA) 30 MG capsule Take 30 mg by mouth daily.     DULoxetine (CYMBALTA) 60 MG capsule TAKE 1 CAPSULE BY MOUTH EVERY DAY 90 capsule 3   esomeprazole (NEXIUM) 40 MG capsule Take 40 mg by mouth as needed.     furosemide (LASIX) 40 MG tablet Take 1 tablet (40 mg total) by mouth daily. 90 tablet 3   gabapentin (NEURONTIN) 800 MG tablet Take 800 mg by mouth 2 (two) times daily.     latanoprost (XALATAN) 0.005 % ophthalmic solution Place 1 drop into both eyes at bedtime.     metoprolol succinate (TOPROL XL) 25 MG 24 hr tablet Take 1 tablet (25 mg total) by mouth daily. 90 tablet 3   OXYGEN Inhale 4 L into the lungs continuous.     pregabalin (LYRICA) 75 MG capsule Take 1 capsule (75 mg total) by mouth 2 (two) times daily. 60 capsule 6   rizatriptan (MAXALT-MLT) 10 MG disintegrating tablet Take 10 mg by mouth as needed for migraine. May repeat in 2 hours if needed     simvastatin (ZOCOR) 10 MG tablet Take 10 mg by mouth at bedtime.     tiZANidine (ZANAFLEX) 4 MG tablet TAKE 1 TABLET BY MOUTH EVERY DAY AT BEDTIME AS NEEDED FOR MUSCLE SPASMS 30 tablet 2   topiramate (TOPAMAX) 25 MG tablet Take 50 mg by mouth as needed.     traZODone (DESYREL) 150 MG tablet Take 1 tablet (150 mg total) by mouth at bedtime. 30 tablet 1   TRELEGY ELLIPTA 100-62.5-25 MCG/ACT AEPB TAKE 1 PUFF BY MOUTH EVERY DAY 60 each 5   Vitamin D, Ergocalciferol, (DRISDOL) 1.25 MG (50000 UNIT) CAPS capsule Take 50,000 Units by mouth once a week.     No current facility-administered medications  for this visit.   Facility-Administered Medications Ordered in Other Visits  Medication Dose Route Frequency Provider Last Rate Last Admin   0.9 %  sodium chloride infusion (Manually program via Guardrails IV Fluids)  250 mL Intravenous Continuous Mery Guadalupe E, NP   Stopped at 09/27/23 1334   0.9 %  sodium chloride infusion (Manually program via Guardrails IV Fluids)  250 mL Intravenous Continuous Carlean Jews, NP   Stopped at 09/27/23 1542   sodium chloride flush (NS) 0.9 % injection 3 mL  3 mL Intracatheter PRN Carlean Jews, NP        HISTORY OF PRESENT ILLNESS:   Oncology History   No history exists.      REVIEW OF SYSTEMS:   Constitutional:  Denies fevers, chills or abnormal weight loss. Moderate fatigue  Eyes: Denies blurriness of vision Ears, nose, mouth, throat, and face: Denies mucositis or sore throat Respiratory: Denies cough, dyspnea or wheezes Cardiovascular: Denies palpitation, chest discomfort or lower extremity swelling Gastrointestinal:  Denies nausea, heartburn or change in bowel habits Skin: Denies abnormal skin rashes Lymphatics: Denies new lymphadenopathy or easy bruising Neurological:Denies numbness, tingling or new weaknesses. Has headache  Behavioral/Psych: Mood is stable, no new changes  All other systems were reviewed with the patient and are negative.   VITALS:  Last menstrual period 08/19/2012.  Wt Readings from Last 3 Encounters:  09/27/23 193 lb 9.6 oz (87.8 kg)  09/26/23 192 lb 3.2 oz (87.2 kg)  09/13/23 189 lb (85.7 kg)    There is no height or weight on file to calculate BMI.  Performance status (ECOG): 1 - Symptomatic but completely ambulatory  LABORATORY DATA:  I have reviewed the data as listed    Component Value Date/Time   NA 139 09/26/2023 1318   NA 143 07/12/2018 1436   NA 141 09/30/2017 0954   K 5.3 (H) 09/26/2023 1318   K 3.7 09/30/2017 0954   CL 104 09/26/2023 1318   CO2 32 09/26/2023 1318   CO2 24 09/30/2017  0954   GLUCOSE 94 09/26/2023 1318   GLUCOSE 96 09/30/2017 0954   BUN 11 09/26/2023 1318   BUN 17 07/12/2018 1436   BUN 8.8 09/30/2017 0954   CREATININE 0.97 09/26/2023 1318   CREATININE 1.04 07/13/2023 1337   CREATININE 0.8 09/30/2017 0954   CALCIUM 8.9 09/26/2023 1318   CALCIUM 10.4 09/30/2017 0954   PROT 6.7 09/26/2023 1318   PROT 7.6 09/30/2017 0954   PROT 7.0 09/30/2017 0954   ALBUMIN 3.7 09/26/2023 1318   ALBUMIN 3.6 09/30/2017 0954   AST 17 09/26/2023 1318   AST 22 09/30/2017 0954   ALT 8 09/26/2023 1318   ALT 32 09/30/2017 0954   ALKPHOS 103 09/26/2023 1318   ALKPHOS 161 (H) 09/30/2017 0954   BILITOT 0.2 09/26/2023 1318   BILITOT 0.29 09/30/2017 0954   GFRNONAA >60 09/26/2023 1318   GFRAA >60 08/27/2019 1430     Lab Results  Component Value Date   WBC 9.2 09/26/2023   NEUTROABS 7.2 09/26/2023   HGB 6.6 (LL) 09/26/2023   HCT 22.7 (L) 09/26/2023   MCV 79.4 (L) 09/26/2023   PLT 156 09/26/2023   Iron/TIBC/Ferritin/ %Sat    Component Value Date/Time   IRON 14 (L) 09/13/2023 1554   IRON 69 09/30/2017 0954   TIBC 584 (H) 09/13/2023 1554   TIBC 411 09/30/2017 0954   FERRITIN 7 (L) 09/26/2023 1510   FERRITIN 51 09/30/2017 0954   IRONPCTSAT 2 (L) 09/13/2023 1554   IRONPCTSAT 17 (L) 09/30/2017 4782

## 2023-10-03 ENCOUNTER — Other Ambulatory Visit: Payer: Self-pay | Admitting: Physician Assistant

## 2023-10-03 NOTE — Progress Notes (Unsigned)
Office Visit Note  Patient: Teresa Roberts             Date of Birth: 05/13/1961           MRN: [id number]             PCP: Mila Palmer, MD Referring: Mila Palmer, MD Visit Date: 10/17/2023 Occupation: @GUAROCC @  Subjective:  Trapezius muscle spasms   History of Present Illness: Teresa Roberts is a 62 y.o. female with history of fibromyalgia.  She remains on cymbalta, gabapentin, zanaflex, and trazodone as prescribed. Patient has not been following up with pain management on a regular basis and is no longer taking Lyrica.  Patient presents today with trapezius muscle tension and tenderness bilaterally.  She has been experiencing muscle spasms intermittently.  Patient has requested trapezius trigger point injections today which have helped to alleviate her symptoms for up to 3 months in the past.  She has been having to take tizanidine to help alleviate these muscle spasms.  She uses a heating pad on her lower back for pain relief.  Patient continues to experience arthralgias and myalgias but has not noticed any joint swelling.  Activities of Daily Living:  Patient reports morning stiffness for 2 hours.   Patient Reports nocturnal pain.  Difficulty dressing/grooming: Denies Difficulty climbing stairs: Reports Difficulty getting out of chair: Reports Difficulty using hands for taps, buttons, cutlery, and/or writing: Reports  Review of Systems  Constitutional:  Positive for fatigue.  HENT:  Positive for mouth dryness. Negative for mouth sores.   Eyes:  Negative for pain, visual disturbance and dryness.  Respiratory:  Positive for cough, shortness of breath and wheezing.   Cardiovascular:  Negative for chest pain and palpitations.  Gastrointestinal:  Positive for blood in stool. Negative for constipation and diarrhea.  Endocrine: Negative for increased urination.  Genitourinary:  Negative for involuntary urination.  Musculoskeletal:  Positive for joint pain, gait problem, joint pain,  myalgias, muscle weakness, morning stiffness, muscle tenderness and myalgias. Negative for joint swelling.  Skin:  Negative for color change, rash, hair loss and sensitivity to sunlight.  Allergic/Immunologic: Negative for susceptible to infections.  Neurological:  Positive for headaches. Negative for dizziness.  Hematological:  Negative for swollen glands.  Psychiatric/Behavioral:  Positive for depressed mood and sleep disturbance. The patient is nervous/anxious.     PMFS History:  Patient Active Problem List   Diagnosis Date Noted   B12 deficiency 09/13/2023   Sciatica of left side associated with disorder of lumbar spine 09/13/2022   Chronic pain syndrome 09/13/2022   Encounter for therapeutic drug monitoring 09/13/2022   History of colonic polyps 01/01/2021   Esophageal reflux 01/01/2021   Hypercalcemia 09/23/2020   Hyperparathyroidism, primary (HCC) 08/25/2020   Iron deficiency anemia 06/27/2019   Coronary artery disease involving native coronary artery of native heart without angina pectoris 09/05/2018   Syncope and collapse 09/05/2018   Pain in joint of left elbow 09/04/2018   Chest pain 05/27/2018   HLD (hyperlipidemia) 05/27/2018   Essential hypertension 05/27/2018   Depression 05/27/2018   OSA (obstructive sleep apnea) 05/03/2018   Pulmonary hypertension (HCC) 05/03/2018   Microcytic anemia 10/19/2017   Bimalleolar fracture of left ankle 08/12/2017   Acute renal failure (ARF) (HCC) 08/09/2017   Chronic diastolic (congestive) heart failure (HCC) 08/09/2017   Other fatigue 06/13/2017   Primary insomnia 06/13/2017   History of migraine 06/13/2017   COPD (chronic obstructive pulmonary disease) (HCC) 02/15/2017   Tobacco abuse 02/15/2017   Respiratory failure  with hypoxia (HCC) 01/28/2017   Migraine 01/28/2017   Daily headache    Hypoxia    Type 2 myocardial infarction without ST elevation (HCC) 01/26/2017   MGUS (monoclonal gammopathy of unknown significance)  01/11/2012   Fibromyalgia 01/11/2012   Bruises easily 01/11/2012    Past Medical History:  Diagnosis Date   CAD (coronary artery disease)    a. 01/2017: cath showing 20% mid RCA stenosis and otherwise normal LAD and LCx with a preserved EF of 55 to 60%; mod pulmonary HTN   Carpal tunnel syndrome    COPD (chronic obstructive pulmonary disease) (HCC)    O2 dependent   cpap    Depression    PTSD   Diastolic CHF (HCC)    Fibromyalgia    Hyperlipidemia    Hypertension    Migraines    Osteoporosis    Sciatic nerve disease     Family History  Problem Relation Age of Onset   Diabetes Mother    Hypertension Mother    CAD Mother    Diabetes Father    Lung cancer Father    Diabetes Sister    Hypercalcemia Neg Hx    Breast cancer Neg Hx    Past Surgical History:  Procedure Laterality Date   APPENDECTOMY     BIOPSY  01/01/2021   Procedure: BIOPSY;  Surgeon: Charlott Rakes, MD;  Location: [facility];  Service: Endoscopy;;   CESAREAN SECTION     COLONOSCOPY WITH PROPOFOL N/A 01/01/2021   Procedure: COLONOSCOPY WITH PROPOFOL;  Surgeon: Charlott Rakes, MD;  Location: [facility];  Service: Endoscopy;  Laterality: N/A;   ESOPHAGOGASTRODUODENOSCOPY (EGD) WITH PROPOFOL N/A 01/01/2021   Procedure: ESOPHAGOGASTRODUODENOSCOPY (EGD) WITH PROPOFOL;  Surgeon: Charlott Rakes, MD;  Location: [facility];  Service: Endoscopy;  Laterality: N/A;   ORIF ANKLE FRACTURE Left 08/12/2017   Procedure: OPEN REDUCTION INTERNAL FIXATION (ORIF) ANKLE FRACTURE;  Surgeon: Samson Frederic, MD;  Location: [facility] OR;  Service: Orthopedics;  Laterality: Left;   RIGHT HEART CATH N/A 07/19/2018   Procedure: RIGHT HEART CATH;  Surgeon: Dolores Patty, MD;  Location: [facility];  Service: Cardiovascular;  Laterality: N/A;   RIGHT/LEFT HEART CATH AND CORONARY ANGIOGRAPHY N/A 01/27/2017   Procedure: Right/Left Heart Cath and Coronary Angiography;  Surgeon: Lennette Bihari, MD;  Location: [facility];   Service: Cardiovascular;  Laterality: N/A;   Social History   Social History Narrative   Not on file   Immunization History  Administered Date(s) Administered   Influenza Split 08/15/2017, 08/28/2018, 08/24/2019   Influenza,inj,Quad PF,6+ Mos 12/28/2016, 09/28/2017, 09/04/2018, 08/03/2019, 08/29/2020, 08/03/2021, 08/11/2022   Influenza,inj,quad, With Preservative 08/09/2014   Influenza-Unspecified 08/15/2016   Moderna Sars-Covid-2 Vaccination 02/11/2020, 03/10/2020, 07/11/2020   Pneumococcal Polysaccharide-23 02/04/2014, 08/31/2016   Pneumococcal-Unspecified 02/13/2013   Respiratory Syncytial Virus Vaccine,Recomb Aduvanted(Arexvy) 08/11/2022   Tdap 04/25/2013, 02/04/2014   Zoster, Live 05/31/2019, 08/03/2019     Objective: Vital Signs: BP 118/81 (BP Location: Left Arm, Patient Position: Sitting, Cuff Size: Normal)   Pulse 86   Ht 5\' 6"  (1.676 m)   Wt 184 lb 3.2 oz (83.6 kg)   LMP 08/19/2012   BMI 29.73 kg/m    Physical Exam Vitals and nursing note reviewed.  Constitutional:      Appearance: She is well-developed.  HENT:     Head: Normocephalic and atraumatic.  Eyes:     Conjunctiva/sclera: Conjunctivae normal.  Cardiovascular:     Rate and Rhythm: Normal rate and regular rhythm.  Heart sounds: Normal heart sounds.  Pulmonary:     Effort: Pulmonary effort is normal.     Breath sounds: Normal breath sounds.  Abdominal:     General: Bowel sounds are normal.     Palpations: Abdomen is soft.  Musculoskeletal:     Cervical back: Normal range of motion.  Lymphadenopathy:     Cervical: No cervical adenopathy.  Skin:    General: Skin is warm and dry.     Capillary Refill: Capillary refill takes less than 2 seconds.  Neurological:     Mental Status: She is alert and oriented to person, place, and time.  Psychiatric:        Behavior: Behavior normal.      Musculoskeletal Exam: Generalized hyperalgesia and positive tender points on exam.  C-spine has limited range  of motion.  Trapezius muscle tension and tenderness bilaterally.  Shoulder joints have limited abduction to about 90 degrees bilaterally.  Elbow joints, wrist joints, MCPs, PIPs, DIPs have good range of motion with no synovitis.  Complete fist formation bilaterally.  Knee joints have good ROM with no warmth or effusion.  Ankle joints have good ROM with no tenderness or joint swelling.   CDAI Exam: CDAI Score: -- Patient Global: --; Provider Global: -- Swollen: --; Tender: -- Joint Exam 10/17/2023   No joint exam has been documented for this visit   There is currently no information documented on the homunculus. Go to the Rheumatology activity and complete the homunculus joint exam.  Investigation: No additional findings.  Imaging: No results found.  Recent Labs: Lab Results  Component Value Date   WBC 9.2 09/26/2023   HGB 6.6 (LL) 09/26/2023   PLT 156 09/26/2023   NA 139 09/26/2023   K 5.3 (H) 09/26/2023   CL 104 09/26/2023   CO2 32 09/26/2023   GLUCOSE 94 09/26/2023   BUN 11 09/26/2023   CREATININE 0.97 09/26/2023   BILITOT 0.2 09/26/2023   ALKPHOS 103 09/26/2023   AST 17 09/26/2023   ALT 8 09/26/2023   PROT 6.7 09/26/2023   ALBUMIN 3.7 09/26/2023   CALCIUM 8.9 09/26/2023   GFRAA >60 08/27/2019    Speciality Comments: No specialty comments available.  Procedures:  Trigger Point Inj  Date/Time: 10/17/2023 2:16 PM  Performed by: Gearldine Bienenstock, PA-C Authorized by: Gearldine Bienenstock, PA-C   Consent Given by:  Patient Site marked: the procedure site was marked   Timeout: prior to procedure the correct patient, procedure, and site was verified   Indications:  Pain Total # of Trigger Points:  2 Location: neck   Needle Size:  27 G Approach:  Dorsal Medications #1:  0.5 mL lidocaine 1 %; 10 mg triamcinolone acetonide 40 MG/ML Medications #2:  0.5 mL lidocaine 1 %; 10 mg triamcinolone acetonide 40 MG/ML Patient tolerance:  Patient tolerated the procedure well with no  immediate complications  Allergies: Citrus and Lisinopril   Assessment / Plan:     Visit Diagnoses: Fibromyalgia -She has generalized hyperalgesia and positive tender points on exam.  Patient presents today with trapezius muscle tension and tenderness bilaterally.  Bilateral trapezius trigger point injections were performed today in the office.  She remains on Cymbalta, tizanidine, gabapentin, and trazodone as prescribed.  She is no longer followed by pain management so she has discontinued Lyrica.  She is unsure if her symptoms were better controlled while taking Lyrica or not.  Patient plans on further discussing with her PCP.  She will follow-up in the  office in 3 months or sooner if needed.  Trapezius muscle spasm: Patient presents today with trapezius muscle tension and tenderness bilaterally.  She has been experiencing muscle spasms intermittently.  She takes tizanidine 4 mg at bedtime as needed for muscle spasms.  She has requested trigger point injections today which have previously alleviated her symptoms for up to 3 months.  She tolerated the procedures well.  Procedure notes were completed above.  Aftercare was discussed.  She is advised to notify us if her symptoms persist or worsen. Discussed scheduling a myofascial release/massage as well as using warm moist heat or pain patches for pain relief.   Other fatigue: Chronic, stable.   Primary insomnia -She takes trazodone 150 mg by mouth at bedtime for insomnia.   Bilateral carpal tunnel syndrome - Bilateral mild carpal tunnel syndrome was noted on the nerve conduction velocities 2023 by Dr. Alvester Morin.  Trochanteric bursitis of both hips: Intermittent discomfort.   Chronic left-sided low back pain without sciatica: Chronic pain. Using a heated pain.    Age-related osteoporosis without current pathological fracture - December, 6/ 2023 DEXA scan BMD 0.492, T-score -3.4 left forearm radius 33%.  She is taking fosamax 70 mg 1 tablet by mouth  once weekly and vitamin D 50,000 units once weekly.    Other medical conditions are listed as follows:   Vitamin D deficiency: She is taking vitamin D 50,000 units once weekly.    Pulmonary hypertension (HCC)  History of COPD - 6L oxygen continuous.   Coronary artery disease involving native coronary artery of native heart without angina pectoris  Essential hypertension: BP was 118/81 today in the office.   Chronic diastolic (congestive) heart failure (HCC)  Gastroesophageal reflux disease, unspecified whether esophagitis present  Abnormal SPEP  History of migraine  MGUS (monoclonal gammopathy of unknown significance)  Anxiety  Former smoker  Orders: Orders Placed This Encounter  Procedures   Trigger Point Inj   No orders of the defined types were placed in this encounter.   Follow-Up Instructions: Return in about 3 months (around 01/15/2024) for Fibromyalgia.   Gearldine Bienenstock, PA-C  Note - This record has been created using Dragon software.  Chart creation errors have been sought, but may not always  have been located. Such creation errors do not reflect on  the standard of medical care.

## 2023-10-04 ENCOUNTER — Ambulatory Visit: Payer: Medicare Other

## 2023-10-04 VITALS — BP 137/83 | HR 76 | Temp 98.3°F | Resp 16 | Ht 66.0 in | Wt 189.6 lb

## 2023-10-04 DIAGNOSIS — D509 Iron deficiency anemia, unspecified: Secondary | ICD-10-CM | POA: Diagnosis not present

## 2023-10-04 DIAGNOSIS — D500 Iron deficiency anemia secondary to blood loss (chronic): Secondary | ICD-10-CM

## 2023-10-04 MED ORDER — DIPHENHYDRAMINE HCL 25 MG PO CAPS
25.0000 mg | ORAL_CAPSULE | Freq: Once | ORAL | Status: AC
Start: 2023-10-04 — End: 2023-10-04
  Administered 2023-10-04: 25 mg via ORAL
  Filled 2023-10-04: qty 1

## 2023-10-04 MED ORDER — SODIUM CHLORIDE 0.9 % IV SOLN
510.0000 mg | Freq: Once | INTRAVENOUS | Status: AC
  Administered 2023-10-04: 510 mg via INTRAVENOUS
  Filled 2023-10-04: qty 17

## 2023-10-04 MED ORDER — ACETAMINOPHEN 325 MG PO TABS
650.0000 mg | ORAL_TABLET | Freq: Once | ORAL | Status: AC
  Administered 2023-10-04: 650 mg via ORAL
  Filled 2023-10-04: qty 2

## 2023-10-04 NOTE — Progress Notes (Signed)
Diagnosis: Iron Deficiency Anemia  Provider:  Chilton Greathouse MD  Procedure: IV Infusion  IV Type: Peripheral, IV Location: L Forearm  Feraheme (Ferumoxytol), Dose: 510 mg  Infusion Start Time: 1512 pm  Infusion Stop Time: 1533  Postsion IV Care: Patient declined observation and Peripheral IV Discontinued  Discharge: Condition: Stable, Destination: Home . AVS Provided  Performed by:  Wyvonne Lenz, RN

## 2023-10-05 ENCOUNTER — Other Ambulatory Visit: Payer: Self-pay | Admitting: Physician Assistant

## 2023-10-05 NOTE — Telephone Encounter (Signed)
Last Fill: 07/19/2023  Labs: 09/26/2023 Potassium 5.3, Anio gap 3, RBC 2.86, Hemoglobin 6.6, HCT 22.7, MCV 79.4, MCH 23.1, MCHC 29.1, RDW 21.3, nRBC 0.4,   Next Visit: 10/17/2023  Last Visit: 07/13/2023  DX: Age-related osteoporosis without current pathological fracture   Current Dose per office note 07/13/2023: alendronate 70 mg p.o. weekly   Okay to refill Fosamax?

## 2023-10-06 MED ORDER — ALENDRONATE SODIUM 70 MG PO TABS: 70.0000 mg | ORAL_TABLET | ORAL | 0 refills | Status: DC

## 2023-10-06 NOTE — Addendum Note (Signed)
Addended by: Henriette Combs on: 10/06/2023 08:46 AM   Modules accepted: Orders

## 2023-10-11 ENCOUNTER — Ambulatory Visit: Payer: Medicare Other

## 2023-10-11 ENCOUNTER — Ambulatory Visit (HOSPITAL_COMMUNITY)
Admission: RE | Admit: 2023-10-11 | Discharge: 2023-10-11 | Disposition: A | Discharge status: Home / Self Care | Payer: Medicare Other | Source: Ambulatory Visit | Attending: Nurse Practitioner | Admitting: Nurse Practitioner

## 2023-10-11 VITALS — BP 125/84 | HR 98 | Temp 98.8°F | Resp 18 | Ht 66.0 in | Wt 185.4 lb

## 2023-10-11 DIAGNOSIS — D472 Monoclonal gammopathy: Secondary | ICD-10-CM | POA: Insufficient documentation

## 2023-10-11 DIAGNOSIS — D509 Iron deficiency anemia, unspecified: Secondary | ICD-10-CM

## 2023-10-11 DIAGNOSIS — D500 Iron deficiency anemia secondary to blood loss (chronic): Secondary | ICD-10-CM

## 2023-10-11 DIAGNOSIS — M1610 Unilateral primary osteoarthritis, unspecified hip: Secondary | ICD-10-CM | POA: Diagnosis not present

## 2023-10-11 DIAGNOSIS — M47816 Spondylosis without myelopathy or radiculopathy, lumbar region: Secondary | ICD-10-CM | POA: Diagnosis not present

## 2023-10-11 DIAGNOSIS — M47814 Spondylosis without myelopathy or radiculopathy, thoracic region: Secondary | ICD-10-CM | POA: Diagnosis not present

## 2023-10-11 MED ORDER — DIPHENHYDRAMINE HCL 25 MG PO CAPS
25.0000 mg | ORAL_CAPSULE | Freq: Once | ORAL | Status: AC
  Administered 2023-10-11: 25 mg via ORAL
  Filled 2023-10-11: qty 1

## 2023-10-11 MED ORDER — SODIUM CHLORIDE 0.9 % IV SOLN
510.0000 mg | Freq: Once | INTRAVENOUS | Status: AC
  Administered 2023-10-11: 510 mg via INTRAVENOUS
  Filled 2023-10-11: qty 17

## 2023-10-11 MED ORDER — ACETAMINOPHEN 325 MG PO TABS
650.0000 mg | ORAL_TABLET | Freq: Once | ORAL | Status: AC
  Administered 2023-10-11: 650 mg via ORAL
  Filled 2023-10-11: qty 2

## 2023-10-11 NOTE — Progress Notes (Signed)
Diagnosis: Iron Deficiency Anemia  Provider:  Chilton Greathouse MD  Procedure: IV Infusion  IV Type: Peripheral, IV Location: L Forearm  Feraheme (Ferumoxytol), Dose: 510 mg  Infusion Start Time: 1538  Infusion Stop Time: 1555  Post Infusion IV Care: Patient declined observation and Peripheral IV Discontinued  Discharge: Condition: Good, Destination: Home . AVS Declined  Performed by:  Garnette Czech, RN

## 2023-10-17 ENCOUNTER — Encounter: Payer: Self-pay | Admitting: Physician Assistant

## 2023-10-17 ENCOUNTER — Ambulatory Visit: Payer: Medicare Other | Attending: Physician Assistant | Admitting: Physician Assistant

## 2023-10-17 VITALS — BP 118/81 | HR 86 | Ht 66.0 in | Wt 184.2 lb

## 2023-10-17 DIAGNOSIS — G8929 Other chronic pain: Secondary | ICD-10-CM

## 2023-10-17 DIAGNOSIS — R5383 Other fatigue: Secondary | ICD-10-CM

## 2023-10-17 DIAGNOSIS — I2510 Atherosclerotic heart disease of native coronary artery without angina pectoris: Secondary | ICD-10-CM

## 2023-10-17 DIAGNOSIS — I272 Pulmonary hypertension, unspecified: Secondary | ICD-10-CM | POA: Diagnosis not present

## 2023-10-17 DIAGNOSIS — M7061 Trochanteric bursitis, right hip: Secondary | ICD-10-CM | POA: Diagnosis not present

## 2023-10-17 DIAGNOSIS — F419 Anxiety disorder, unspecified: Secondary | ICD-10-CM

## 2023-10-17 DIAGNOSIS — E559 Vitamin D deficiency, unspecified: Secondary | ICD-10-CM

## 2023-10-17 DIAGNOSIS — I5032 Chronic diastolic (congestive) heart failure: Secondary | ICD-10-CM

## 2023-10-17 DIAGNOSIS — M797 Fibromyalgia: Secondary | ICD-10-CM

## 2023-10-17 DIAGNOSIS — G5603 Carpal tunnel syndrome, bilateral upper limbs: Secondary | ICD-10-CM | POA: Diagnosis not present

## 2023-10-17 DIAGNOSIS — Z8709 Personal history of other diseases of the respiratory system: Secondary | ICD-10-CM

## 2023-10-17 DIAGNOSIS — K649 Unspecified hemorrhoids: Secondary | ICD-10-CM | POA: Diagnosis not present

## 2023-10-17 DIAGNOSIS — K219 Gastro-esophageal reflux disease without esophagitis: Secondary | ICD-10-CM

## 2023-10-17 DIAGNOSIS — R778 Other specified abnormalities of plasma proteins: Secondary | ICD-10-CM

## 2023-10-17 DIAGNOSIS — D472 Monoclonal gammopathy: Secondary | ICD-10-CM

## 2023-10-17 DIAGNOSIS — F5101 Primary insomnia: Secondary | ICD-10-CM | POA: Diagnosis not present

## 2023-10-17 DIAGNOSIS — D509 Iron deficiency anemia, unspecified: Secondary | ICD-10-CM | POA: Diagnosis not present

## 2023-10-17 DIAGNOSIS — M810 Age-related osteoporosis without current pathological fracture: Secondary | ICD-10-CM

## 2023-10-17 DIAGNOSIS — M7062 Trochanteric bursitis, left hip: Secondary | ICD-10-CM

## 2023-10-17 DIAGNOSIS — Z8669 Personal history of other diseases of the nervous system and sense organs: Secondary | ICD-10-CM

## 2023-10-17 DIAGNOSIS — M62838 Other muscle spasm: Secondary | ICD-10-CM

## 2023-10-17 DIAGNOSIS — Z87891 Personal history of nicotine dependence: Secondary | ICD-10-CM

## 2023-10-17 DIAGNOSIS — I10 Essential (primary) hypertension: Secondary | ICD-10-CM

## 2023-10-17 DIAGNOSIS — M545 Low back pain, unspecified: Secondary | ICD-10-CM

## 2023-10-17 DIAGNOSIS — J9611 Chronic respiratory failure with hypoxia: Secondary | ICD-10-CM | POA: Diagnosis not present

## 2023-10-17 MED ORDER — TRIAMCINOLONE ACETONIDE 40 MG/ML IJ SUSP
10.0000 mg | INTRAMUSCULAR | Status: AC | PRN
  Administered 2023-10-17: 10 mg via INTRAMUSCULAR

## 2023-10-17 MED ORDER — LIDOCAINE HCL 1 % IJ SOLN
0.5000 mL | INTRAMUSCULAR | Status: AC | PRN
  Administered 2023-10-17: .5 mL

## 2023-11-01 ENCOUNTER — Telehealth: Payer: Self-pay | Admitting: *Deleted

## 2023-11-01 ENCOUNTER — Ambulatory Visit: Payer: Self-pay | Admitting: General Surgery

## 2023-11-01 DIAGNOSIS — K648 Other hemorrhoids: Secondary | ICD-10-CM | POA: Diagnosis not present

## 2023-11-01 DIAGNOSIS — K644 Residual hemorrhoidal skin tags: Secondary | ICD-10-CM | POA: Diagnosis not present

## 2023-11-01 NOTE — Telephone Encounter (Signed)
   Pre-operative Risk Assessment    Patient Name: Teresa Roberts  DOB: 1961/07/15 MRN: [id number]  DATE OF LAST VISIT: 12/02/22 Gillian Shields, NP DATE OF NEXT VISIT: NONE    Request for Surgical Clearance    Procedure:   HEMORRHOID SURGERY  Date of Surgery:  Clearance TBD                                 Surgeon:  DR. [name] Surgeon's Group or Practice Name:  [facility] Phone number:  [phone] Fax number:  [phone] ATTN: Michel Bickers, LPN   Type of Clearance Requested:   - Medical  - Pharmacy:  Hold Aspirin     Type of Anesthesia:  General    Additional requests/questions:    Elpidio Anis   11/01/2023, 12:02 PM

## 2023-11-01 NOTE — H&P (Signed)
REFERRING PHYSICIAN:  Emeterio Reeve, MD  PROVIDER:  Elenora Gamma, MD  MRN: [id number] DOB: 11/28/1960 DATE OF ENCOUNTER: 11/01/2023  Subjective   Chief Complaint: New Consultation     History of Present Illness: Teresa Roberts is a 63 y.o. female who is seen today as an office consultation at the request of Dr. Paulino Rily for evaluation of New Consultation .  Patient is states she is having worsening bleeding from her hemorrhoids.  She states that she occasionally has flares of significant bleeding and pain.  She reports regular bowel habits and denies any straining or sitting on the toilet for long periods of time.  She has never had any hemorrhoid surgery before.   Review of Systems: A complete review of systems was obtained from the patient.  I have reviewed this information and discussed as appropriate with the patient.  See HPI as well for other ROS.   Medical History: Past Medical History:  Diagnosis Date   COPD (chronic obstructive pulmonary disease) (CMS/HHS-HCC)    Glaucoma (increased eye pressure)     Patient Active Problem List  Diagnosis   COPD (chronic obstructive pulmonary disease) (CMS/HHS-HCC)   Chronic diastolic (congestive) heart failure (CMS/HHS-HCC)   Coronary artery disease involving native coronary artery of native heart without angina pectoris   Essential hypertension   Fibromyalgia   Hypoxia   OSA (obstructive sleep apnea)   Pulmonary hypertension (CMS/HHS-HCC)   Type 2 myocardial infarction without ST elevation (CMS/HHS-HCC)   Bleeding hemorrhoids    Past Surgical History:  Procedure Laterality Date   Ankle surgery       Allergies  Allergen Reactions   Lisinopril Cough    Current Outpatient Medications on File Prior to Visit  Medication Sig Dispense Refill   albuterol (PROVENTIL) 2.5 mg /3 mL (0.083 %) nebulizer solution Inhale 2.5 mg into the lungs every 6 (six) hours as needed     albuterol MDI, PROVENTIL, VENTOLIN, PROAIR,  HFA 90 mcg/actuation inhaler Inhale 2 Inhalations into the lungs every 6 (six) hours as needed     alendronate (FOSAMAX) 70 MG tablet Take 70 mg by mouth     ARIPiprazole (ABILIFY) 5 MG tablet Take 1/2 tab for I week and than full tab daily     aspirin 81 MG EC tablet Take 81 mg by mouth once daily     busPIRone (BUSPAR) 10 MG tablet Take 10 mg by mouth 3 (three) times daily     cyanocobalamin (VITAMIN B12) 1000 MCG tablet 1 tablet Orally Once a day for 90 days     cyclobenzaprine (FLEXERIL) 10 MG tablet 1 tablet Orally three times a day as needed for 90 days     dorzolamide-timoloL (COSOPT) 22.3-6.8 mg/mL ophthalmic solution Apply 1 drop to eye 2 (two) times daily     DULoxetine (CYMBALTA) 60 MG DR capsule Take 1 capsule by mouth once daily     ergocalciferol, vitamin D2, 1,250 mcg (50,000 unit) capsule Take 50,000 Units by mouth every 7 (seven) days     esomeprazole (NEXIUM) 40 MG DR capsule Take 40 mg by mouth     folic acid (FOLVITE) 1 MG tablet 1 tablet Orally Once a day for 90 days     FUROsemide (LASIX) 40 MG tablet Take 40 mg by mouth once daily     gabapentin (NEURONTIN) 800 MG tablet Take 800 mg by mouth 2 (two) times daily     metoprolol SUCCinate (TOPROL-XL) 25 MG XL tablet Take 1 tablet by  mouth once daily     pregabalin (LYRICA) 75 MG capsule Take 75 mg by mouth 2 (two) times daily     rizatriptan (MAXALT-MLT) 10 MG disintegrating tablet Take 10 mg by mouth once daily as needed     simvastatin (ZOCOR) 10 MG tablet Take 10 mg by mouth at bedtime     traZODone (DESYREL) 150 MG tablet Take 150 mg by mouth at bedtime     TRELEGY ELLIPTA 100-62.5-25 mcg inhaler Inhale 1 Inhalation into the lungs once daily     No current facility-administered medications on file prior to visit.    History reviewed. No pertinent family history.   Social History   Tobacco Use  Smoking Status Every Day   Current packs/day: 0.50   Types: Cigarettes  Smokeless Tobacco Never     Social History    Socioeconomic History   Marital status: Married  Tobacco Use   Smoking status: Every Day    Current packs/day: 0.50    Types: Cigarettes   Smokeless tobacco: Never  Substance and Sexual Activity   Alcohol use: Not Currently   Drug use: Yes    Objective:    Vitals:   11/01/23 1003 11/01/23 1008  BP: 114/78   Temp: 36.4 C (97.5 F)   Weight: 82.5 kg (181 lb 12.8 oz)   Height: 167.6 cm (5\' 6" )   PainSc:    6  PainLoc:  Rectum     Exam Gen: NAD Abd: soft Rectal: Circumferential skin tags with some mild excoriation externally in the right posterior area.   Labs, Imaging and Diagnostic Testing:  Procedure: Anoscopy Surgeon: Maisie Fus After the risks and benefits were explained, written consent was obtained for above procedure.  A medical assistant chaperone was present thoroughout the entire procedure.  Anesthesia: none Diagnosis: hemorrhoids Findings: Grade 1 left lateral, grade 2 right anterior, grade 3 right posterior with excoriation externally as well.   Assessment and Plan:  Diagnoses and all orders for this visit:  Internal and external bleeding hemorrhoids     62 year old female with multiple medical problems and active smoker who presents to the office for evaluation of hemorrhoids.  She states that she is having regular bowel function but continuing to have episodes of inflammation and bleeding.  We discussed the option of trans hemorrhoidal dearterialization which would help with her bleeding.  We also discussed a full hemorrhoidectomy to help with her external symptoms as well.  We discussed that with the full hemorrhoidectomy she could have significant pain for several months after surgery.  We discussed that she cannot be on opiates for that long and will only be prescribed these for the week after surgery.  We discussed the need for ongoing control of her bowel habits during recovery.  There is also a small risk of stricture and recurrence.  After discussing  this in detail, the patient has elected to proceed with a full hemorrhoidectomy.  I believe she will need cardiac and pulmonary clearance prior to anesthesia.  [name], MD Colon and Rectal Surgery [facility] Surgery

## 2023-11-01 NOTE — Telephone Encounter (Signed)
   Name: Teresa Roberts  DOB: [birth year]  MRN: [id number]  Primary Cardiologist: Jodelle Red, MD  Chart reviewed as part of pre-operative protocol coverage. Because of [name] past medical history and time since last visit, she will require a follow-up in-office visit in order to better assess preoperative cardiovascular risk. Pt is overdue for 6 months follow-up.   Pre-op covering staff: - Please schedule appointment and call patient to inform them. If patient already had an upcoming appointment within acceptable timeframe, please add "pre-op clearance" to the appointment notes so provider is aware. - Please contact requesting surgeon's office via preferred method (i.e, phone, fax) to inform them of need for appointment prior to surgery.   Joylene Grapes, NP  11/01/2023, 12:53 PM

## 2023-11-02 NOTE — Telephone Encounter (Signed)
1st attempt to reach pt to schedule IN OFFICE visit. No answer, Lvm

## 2023-11-03 ENCOUNTER — Telehealth: Payer: Self-pay | Admitting: Pulmonary Disease

## 2023-11-03 NOTE — Telephone Encounter (Signed)
Fax received from Dr. [name] with [facility] to perform a hemorrhoid surgery under general anesthesia on patient.  Patient needs surgery clearance. Surgery is pending. Patient was seen on 12/13/22 . Office protocol is a risk assessment can be sent to surgeon if patient has been seen in 60 days or less.   Dr Vassie Loll had no openings until mid Feb 2025  She asked for the soonest available with any provider so I have scheduled her with [facility] for 11/24/23

## 2023-11-04 NOTE — Telephone Encounter (Signed)
Pt has been scheduled to see Hubbard Hartshorn, NP, 11/17/22 8:50, clearance will be addressed at that time.  Will route back to requesting surgeon's office to make them aware.

## 2023-11-17 ENCOUNTER — Encounter (HOSPITAL_COMMUNITY): Payer: Self-pay

## 2023-11-17 ENCOUNTER — Encounter: Payer: Self-pay | Admitting: Hematology

## 2023-11-17 ENCOUNTER — Other Ambulatory Visit: Payer: Self-pay

## 2023-11-17 ENCOUNTER — Emergency Department (HOSPITAL_COMMUNITY): Payer: Medicare Other

## 2023-11-17 ENCOUNTER — Inpatient Hospital Stay (HOSPITAL_COMMUNITY)
Admission: EM | Admit: 2023-11-17 | Discharge: 2023-11-18 | DRG: 189 | Disposition: A | Discharge status: Hospice | Payer: Medicare Other | Attending: Internal Medicine | Admitting: Internal Medicine

## 2023-11-17 DIAGNOSIS — E8729 Other acidosis: Secondary | ICD-10-CM | POA: Diagnosis present

## 2023-11-17 DIAGNOSIS — J9622 Acute and chronic respiratory failure with hypercapnia: Secondary | ICD-10-CM | POA: Diagnosis not present

## 2023-11-17 DIAGNOSIS — I10 Essential (primary) hypertension: Secondary | ICD-10-CM | POA: Diagnosis present

## 2023-11-17 DIAGNOSIS — F17210 Nicotine dependence, cigarettes, uncomplicated: Secondary | ICD-10-CM | POA: Diagnosis not present

## 2023-11-17 DIAGNOSIS — J439 Emphysema, unspecified: Secondary | ICD-10-CM | POA: Diagnosis present

## 2023-11-17 DIAGNOSIS — Z7983 Long term (current) use of bisphosphonates: Secondary | ICD-10-CM

## 2023-11-17 DIAGNOSIS — R1013 Epigastric pain: Secondary | ICD-10-CM | POA: Diagnosis not present

## 2023-11-17 DIAGNOSIS — E669 Obesity, unspecified: Secondary | ICD-10-CM | POA: Diagnosis present

## 2023-11-17 DIAGNOSIS — I700 Atherosclerosis of aorta: Secondary | ICD-10-CM | POA: Diagnosis not present

## 2023-11-17 DIAGNOSIS — D509 Iron deficiency anemia, unspecified: Secondary | ICD-10-CM | POA: Diagnosis present

## 2023-11-17 DIAGNOSIS — R062 Wheezing: Secondary | ICD-10-CM | POA: Diagnosis not present

## 2023-11-17 DIAGNOSIS — B338 Other specified viral diseases: Secondary | ICD-10-CM | POA: Diagnosis not present

## 2023-11-17 DIAGNOSIS — Z7982 Long term (current) use of aspirin: Secondary | ICD-10-CM

## 2023-11-17 DIAGNOSIS — E876 Hypokalemia: Secondary | ICD-10-CM | POA: Diagnosis present

## 2023-11-17 DIAGNOSIS — B974 Respiratory syncytial virus as the cause of diseases classified elsewhere: Secondary | ICD-10-CM | POA: Diagnosis present

## 2023-11-17 DIAGNOSIS — J9620 Acute and chronic respiratory failure, unspecified whether with hypoxia or hypercapnia: Secondary | ICD-10-CM | POA: Diagnosis present

## 2023-11-17 DIAGNOSIS — R739 Hyperglycemia, unspecified: Secondary | ICD-10-CM | POA: Diagnosis not present

## 2023-11-17 DIAGNOSIS — Z888 Allergy status to other drugs, medicaments and biological substances status: Secondary | ICD-10-CM

## 2023-11-17 DIAGNOSIS — I5032 Chronic diastolic (congestive) heart failure: Secondary | ICD-10-CM | POA: Diagnosis present

## 2023-11-17 DIAGNOSIS — J9621 Acute and chronic respiratory failure with hypoxia: Principal | ICD-10-CM | POA: Diagnosis present

## 2023-11-17 DIAGNOSIS — Z955 Presence of coronary angioplasty implant and graft: Secondary | ICD-10-CM | POA: Diagnosis not present

## 2023-11-17 DIAGNOSIS — Z1152 Encounter for screening for COVID-19: Secondary | ICD-10-CM

## 2023-11-17 DIAGNOSIS — R0682 Tachypnea, not elsewhere classified: Secondary | ICD-10-CM | POA: Diagnosis not present

## 2023-11-17 DIAGNOSIS — I110 Hypertensive heart disease with heart failure: Secondary | ICD-10-CM | POA: Diagnosis present

## 2023-11-17 DIAGNOSIS — E785 Hyperlipidemia, unspecified: Secondary | ICD-10-CM | POA: Diagnosis not present

## 2023-11-17 DIAGNOSIS — M797 Fibromyalgia: Secondary | ICD-10-CM | POA: Diagnosis present

## 2023-11-17 DIAGNOSIS — A419 Sepsis, unspecified organism: Secondary | ICD-10-CM

## 2023-11-17 DIAGNOSIS — Z9981 Dependence on supplemental oxygen: Secondary | ICD-10-CM

## 2023-11-17 DIAGNOSIS — G4733 Obstructive sleep apnea (adult) (pediatric): Secondary | ICD-10-CM | POA: Diagnosis not present

## 2023-11-17 DIAGNOSIS — Z7951 Long term (current) use of inhaled steroids: Secondary | ICD-10-CM

## 2023-11-17 DIAGNOSIS — I2510 Atherosclerotic heart disease of native coronary artery without angina pectoris: Secondary | ICD-10-CM | POA: Diagnosis not present

## 2023-11-17 DIAGNOSIS — R55 Syncope and collapse: Secondary | ICD-10-CM | POA: Diagnosis not present

## 2023-11-17 DIAGNOSIS — J9602 Acute respiratory failure with hypercapnia: Secondary | ICD-10-CM

## 2023-11-17 DIAGNOSIS — R404 Transient alteration of awareness: Secondary | ICD-10-CM | POA: Diagnosis not present

## 2023-11-17 DIAGNOSIS — N179 Acute kidney failure, unspecified: Secondary | ICD-10-CM | POA: Diagnosis present

## 2023-11-17 DIAGNOSIS — J9611 Chronic respiratory failure with hypoxia: Secondary | ICD-10-CM | POA: Diagnosis not present

## 2023-11-17 DIAGNOSIS — J441 Chronic obstructive pulmonary disease with (acute) exacerbation: Principal | ICD-10-CM | POA: Diagnosis present

## 2023-11-17 DIAGNOSIS — G9349 Other encephalopathy: Secondary | ICD-10-CM | POA: Diagnosis not present

## 2023-11-17 DIAGNOSIS — R9431 Abnormal electrocardiogram [ECG] [EKG]: Secondary | ICD-10-CM | POA: Diagnosis present

## 2023-11-17 DIAGNOSIS — Z720 Tobacco use: Secondary | ICD-10-CM | POA: Diagnosis not present

## 2023-11-17 DIAGNOSIS — R0603 Acute respiratory distress: Secondary | ICD-10-CM | POA: Diagnosis not present

## 2023-11-17 DIAGNOSIS — Z79899 Other long term (current) drug therapy: Secondary | ICD-10-CM

## 2023-11-17 LAB — I-STAT VENOUS BLOOD GAS, ED
Acid-Base Excess: 6 mmol/L — ABNORMAL HIGH (ref 0.0–2.0)
Acid-Base Excess: 2 mmol/L (ref 0.0–2.0)
Bicarbonate: 38.7 mmol/L — ABNORMAL HIGH (ref 20.0–28.0)
Bicarbonate: 33.7 mmol/L — ABNORMAL HIGH (ref 20.0–28.0)
Calcium, Ion: 1.28 mmol/L (ref 1.15–1.40)
Calcium, Ion: 1.22 mmol/L (ref 1.15–1.40)
HCT: 41 % (ref 36.0–46.0)
HCT: 35 % — ABNORMAL LOW (ref 36.0–46.0)
Hemoglobin: 13.9 g/dL (ref 12.0–15.0)
Hemoglobin: 11.9 g/dL — ABNORMAL LOW (ref 12.0–15.0)
O2 Saturation: 38 %
O2 Saturation: 51 %
Potassium: 3.4 mmol/L — ABNORMAL LOW (ref 3.5–5.1)
Potassium: 3.1 mmol/L — ABNORMAL LOW (ref 3.5–5.1)
Sodium: 144 mmol/L (ref 135–145)
Sodium: 146 mmol/L — ABNORMAL HIGH (ref 135–145)
TCO2: 42 mmol/L — ABNORMAL HIGH (ref 22–32)
TCO2: 37 mmol/L — ABNORMAL HIGH (ref 22–32)
pCO2, Ven: 106.5 mm[Hg] (ref 44–60)
pCO2, Ven: 100.7 mm[Hg] (ref 44–60)
pH, Ven: 7.168 — CL (ref 7.25–7.43)
pH, Ven: 7.132 — CL (ref 7.25–7.43)
pO2, Ven: 30 mm[Hg] — CL (ref 32–45)
pO2, Ven: 38 mm[Hg] (ref 32–45)

## 2023-11-17 LAB — CBC
HCT: 42.9 % (ref 36.0–46.0)
HCT: 35.8 % — ABNORMAL LOW (ref 36.0–46.0)
Hemoglobin: 12.4 g/dL (ref 12.0–15.0)
Hemoglobin: 10.3 g/dL — ABNORMAL LOW (ref 12.0–15.0)
MCH: 26.8 pg (ref 26.0–34.0)
MCH: 27.2 pg (ref 26.0–34.0)
MCHC: 28.9 g/dL — ABNORMAL LOW (ref 30.0–36.0)
MCHC: 28.8 g/dL — ABNORMAL LOW (ref 30.0–36.0)
MCV: 92.9 fL (ref 80.0–100.0)
MCV: 94.5 fL (ref 80.0–100.0)
Platelets: 219 10*3/uL (ref 150–400)
Platelets: 165 10*3/uL (ref 150–400)
RBC: 4.62 MIL/uL (ref 3.87–5.11)
RBC: 3.79 MIL/uL — ABNORMAL LOW (ref 3.87–5.11)
RDW: 21.5 % — ABNORMAL HIGH (ref 11.5–15.5)
RDW: 21.4 % — ABNORMAL HIGH (ref 11.5–15.5)
WBC: 13 10*3/uL — ABNORMAL HIGH (ref 4.0–10.5)
WBC: 9.5 10*3/uL (ref 4.0–10.5)
nRBC: 0.4 % — ABNORMAL HIGH (ref 0.0–0.2)
nRBC: 0 % (ref 0.0–0.2)

## 2023-11-17 LAB — I-STAT ARTERIAL BLOOD GAS, ED
Acid-Base Excess: 3 mmol/L — ABNORMAL HIGH (ref 0.0–2.0)
Bicarbonate: 33.6 mmol/L — ABNORMAL HIGH (ref 20.0–28.0)
Calcium, Ion: 1.27 mmol/L (ref 1.15–1.40)
HCT: 34 % — ABNORMAL LOW (ref 36.0–46.0)
Hemoglobin: 11.6 g/dL — ABNORMAL LOW (ref 12.0–15.0)
O2 Saturation: 81 %
Patient temperature: 98.6
Potassium: 3.2 mmol/L — ABNORMAL LOW (ref 3.5–5.1)
Sodium: 145 mmol/L (ref 135–145)
TCO2: 36 mmol/L — ABNORMAL HIGH (ref 22–32)
pCO2 arterial: 87.7 mm[Hg] (ref 32–48)
pH, Arterial: 7.192 — CL (ref 7.35–7.45)
pO2, Arterial: 59 mm[Hg] — ABNORMAL LOW (ref 83–108)

## 2023-11-17 LAB — COMPREHENSIVE METABOLIC PANEL
ALT: 15 U/L (ref 0–44)
AST: 22 U/L (ref 15–41)
Albumin: 3.5 g/dL (ref 3.5–5.0)
Alkaline Phosphatase: 95 U/L (ref 38–126)
Anion gap: 13 (ref 5–15)
BUN: 18 mg/dL (ref 8–23)
CO2: 32 mmol/L (ref 22–32)
Calcium: 10.1 mg/dL (ref 8.9–10.3)
Chloride: 99 mmol/L (ref 98–111)
Creatinine, Ser: 1.82 mg/dL — ABNORMAL HIGH (ref 0.44–1.00)
GFR, Estimated: 31 mL/min — ABNORMAL LOW (ref >60)
Glucose, Bld: 170 mg/dL — ABNORMAL HIGH (ref 70–99)
Potassium: 3.4 mmol/L — ABNORMAL LOW (ref 3.5–5.1)
Sodium: 144 mmol/L (ref 135–145)
Total Bilirubin: 0.3 mg/dL (ref 0.0–1.2)
Total Protein: 7.1 g/dL (ref 6.5–8.1)

## 2023-11-17 LAB — RESPIRATORY PANEL BY PCR

## 2023-11-17 LAB — POCT I-STAT 7, (LYTES, BLD GAS, ICA,H+H)
Acid-Base Excess: 4 mmol/L — ABNORMAL HIGH (ref 0.0–2.0)
Bicarbonate: 32.9 mmol/L — ABNORMAL HIGH (ref 20.0–28.0)
Calcium, Ion: 1.35 mmol/L (ref 1.15–1.40)
HCT: 32 % — ABNORMAL LOW (ref 36.0–46.0)
Hemoglobin: 10.9 g/dL — ABNORMAL LOW (ref 12.0–15.0)
O2 Saturation: 98 %
Patient temperature: 98.1
Potassium: 4.2 mmol/L (ref 3.5–5.1)
Sodium: 142 mmol/L (ref 135–145)
TCO2: 35 mmol/L — ABNORMAL HIGH (ref 22–32)
pCO2 arterial: 72.4 mm[Hg] (ref 32–48)
pH, Arterial: 7.265 — ABNORMAL LOW (ref 7.35–7.45)
pO2, Arterial: 117 mm[Hg] — ABNORMAL HIGH (ref 83–108)

## 2023-11-17 LAB — TROPONIN I (HIGH SENSITIVITY)
Troponin I (High Sensitivity): 13 ng/L (ref <18)
Troponin I (High Sensitivity): 14 ng/L (ref <18)
Troponin I (High Sensitivity): 11 ng/L (ref <18)

## 2023-11-17 LAB — RESP PANEL BY RT-PCR (RSV, FLU A&B, COVID)  RVPGX2
Influenza A by PCR: NEGATIVE
Influenza B by PCR: NEGATIVE
Resp Syncytial Virus by PCR: POSITIVE — AB
SARS Coronavirus 2 by RT PCR: NEGATIVE

## 2023-11-17 LAB — GLUCOSE, CAPILLARY
Glucose-Capillary: 175 mg/dL — ABNORMAL HIGH (ref 70–99)
Glucose-Capillary: 93 mg/dL (ref 70–99)
Glucose-Capillary: 117 mg/dL — ABNORMAL HIGH (ref 70–99)

## 2023-11-17 LAB — BLOOD GAS, VENOUS
Acid-Base Excess: 0.7 mmol/L (ref 0.0–2.0)
Bicarbonate: 30.8 mmol/L — ABNORMAL HIGH (ref 20.0–28.0)
Drawn by: 13791
O2 Saturation: 57.3 %
Patient temperature: 36.7
pCO2, Ven: 76 mm[Hg] (ref 44–60)
pH, Ven: 7.21 — ABNORMAL LOW (ref 7.25–7.43)
pO2, Ven: 35 mm[Hg] (ref 32–45)

## 2023-11-17 LAB — HEMOGLOBIN A1C
Hgb A1c MFr Bld: 5.1 % (ref 4.8–5.6)
Mean Plasma Glucose: 100 mg/dL

## 2023-11-17 LAB — I-STAT CG4 LACTIC ACID, ED
Lactic Acid, Venous: 3.4 mmol/L (ref 0.5–1.9)
Lactic Acid, Venous: 3.6 mmol/L (ref 0.5–1.9)

## 2023-11-17 LAB — CREATININE, SERUM
Creatinine, Ser: 1.37 mg/dL — ABNORMAL HIGH (ref 0.44–1.00)
GFR, Estimated: 44 mL/min — ABNORMAL LOW (ref >60)

## 2023-11-17 LAB — LACTIC ACID, PLASMA
Lactic Acid, Venous: 5.8 mmol/L (ref 0.5–1.9)
Lactic Acid, Venous: 3.5 mmol/L (ref 0.5–1.9)

## 2023-11-17 LAB — CBG MONITORING, ED: Glucose-Capillary: 224 mg/dL — ABNORMAL HIGH (ref 70–99)

## 2023-11-17 LAB — HIV ANTIBODY (ROUTINE TESTING W REFLEX): HIV Screen 4th Generation wRfx: NONREACTIVE

## 2023-11-17 LAB — BRAIN NATRIURETIC PEPTIDE: B Natriuretic Peptide: 34 pg/mL (ref 0.0–100.0)

## 2023-11-17 LAB — MRSA NEXT GEN BY PCR, NASAL: MRSA by PCR Next Gen: NOT DETECTED

## 2023-11-17 MED ORDER — METHYLPREDNISOLONE SODIUM SUCC 125 MG IJ SOLR
80.0000 mg | Freq: Two times a day (BID) | INTRAMUSCULAR | Status: DC
  Administered 2023-11-17 – 2023-11-18 (×3): 80 mg via INTRAVENOUS
  Filled 2023-11-17 (×3): qty 2

## 2023-11-17 MED ORDER — LACTATED RINGERS IV BOLUS
500.0000 mL | Freq: Once | INTRAVENOUS | Status: AC
Start: 2023-11-17 — End: 2023-11-17
  Administered 2023-11-17: 500 mL via INTRAVENOUS

## 2023-11-17 MED ORDER — IPRATROPIUM-ALBUTEROL 0.5-2.5 (3) MG/3ML IN SOLN
3.0000 mL | Freq: Once | RESPIRATORY_TRACT | Status: AC
Start: 2023-11-17 — End: 2023-11-17
  Administered 2023-11-17: 3 mL via RESPIRATORY_TRACT

## 2023-11-17 MED ORDER — LACTATED RINGERS IV SOLN: INTRAVENOUS | Status: DC

## 2023-11-17 MED ORDER — LACTATED RINGERS IV BOLUS
500.0000 mL | Freq: Once | INTRAVENOUS | Status: AC
  Administered 2023-11-17: 500 mL via INTRAVENOUS

## 2023-11-17 MED ORDER — IPRATROPIUM-ALBUTEROL 0.5-2.5 (3) MG/3ML IN SOLN
3.0000 mL | RESPIRATORY_TRACT | Status: DC
  Administered 2023-11-17 – 2023-11-18 (×7): 3 mL via RESPIRATORY_TRACT
  Filled 2023-11-17 (×6): qty 3

## 2023-11-17 MED ORDER — DEXTROSE-SODIUM CHLORIDE 5-0.45 % IV SOLN: INTRAVENOUS | Status: DC

## 2023-11-17 MED ORDER — DULOXETINE HCL 30 MG PO CPEP
60.0000 mg | ORAL_CAPSULE | Freq: Every day | ORAL | Status: DC
  Administered 2023-11-18: 60 mg via ORAL
  Filled 2023-11-17: qty 2

## 2023-11-17 MED ORDER — ARIPIPRAZOLE 5 MG PO TABS
5.0000 mg | ORAL_TABLET | Freq: Every day | ORAL | Status: DC
  Administered 2023-11-18: 5 mg via ORAL
  Filled 2023-11-17: qty 1

## 2023-11-17 MED ORDER — MAGNESIUM SULFATE 2 GM/50ML IV SOLN
2.0000 g | Freq: Once | INTRAVENOUS | Status: AC
  Administered 2023-11-17: 2 g via INTRAVENOUS
  Filled 2023-11-17: qty 50

## 2023-11-17 MED ORDER — CHLORHEXIDINE GLUCONATE CLOTH 2 % EX PADS
6.0000 | MEDICATED_PAD | Freq: Every day | CUTANEOUS | Status: DC
Start: 2023-11-17 — End: 2023-11-18
  Administered 2023-11-17 – 2023-11-18 (×2): 6 via TOPICAL

## 2023-11-17 MED ORDER — METHYLPREDNISOLONE SODIUM SUCC 125 MG IJ SOLR
125.0000 mg | Freq: Once | INTRAMUSCULAR | Status: AC
Start: 2023-11-17 — End: 2023-11-17
  Administered 2023-11-17: 125 mg via INTRAVENOUS
  Filled 2023-11-17: qty 2

## 2023-11-17 MED ORDER — ORAL CARE MOUTH RINSE: 15.0000 mL | OROMUCOSAL | Status: DC | PRN

## 2023-11-17 MED ORDER — ORAL CARE MOUTH RINSE
15.0000 mL | OROMUCOSAL | Status: DC
Start: 2023-11-17 — End: 2023-11-18
  Administered 2023-11-17 – 2023-11-18 (×2): 15 mL via OROMUCOSAL

## 2023-11-17 MED ORDER — ALBUTEROL SULFATE (2.5 MG/3ML) 0.083% IN NEBU
15.0000 mg/h | INHALATION_SOLUTION | Freq: Once | RESPIRATORY_TRACT | Status: AC
  Administered 2023-11-17: 15 mg/h via RESPIRATORY_TRACT
  Filled 2023-11-17: qty 18

## 2023-11-17 MED ORDER — POTASSIUM CHLORIDE 10 MEQ/100ML IV SOLN
10.0000 meq | INTRAVENOUS | Status: AC
  Administered 2023-11-17 (×2): 10 meq via INTRAVENOUS
  Filled 2023-11-17: qty 100

## 2023-11-17 MED ORDER — POLYETHYLENE GLYCOL 3350 17 G PO PACK: 17.0000 g | PACK | Freq: Every day | ORAL | Status: DC | PRN

## 2023-11-17 MED ORDER — SODIUM CHLORIDE 0.9 % IV BOLUS
500.0000 mL | Freq: Once | INTRAVENOUS | Status: AC
  Administered 2023-11-17: 500 mL via INTRAVENOUS

## 2023-11-17 MED ORDER — HEPARIN SODIUM (PORCINE) 5000 UNIT/ML IJ SOLN
5000.0000 [IU] | Freq: Three times a day (TID) | INTRAMUSCULAR | Status: DC
  Administered 2023-11-17 – 2023-11-18 (×2): 5000 [IU] via SUBCUTANEOUS
  Filled 2023-11-17 (×2): qty 1

## 2023-11-17 MED ORDER — INSULIN ASPART 100 UNIT/ML IJ SOLN
0.0000 [IU] | INTRAMUSCULAR | Status: DC
  Administered 2023-11-17: 4 [IU] via SUBCUTANEOUS
  Administered 2023-11-17: 7 [IU] via SUBCUTANEOUS
  Administered 2023-11-18 (×2): 3 [IU] via SUBCUTANEOUS

## 2023-11-17 MED ORDER — LACTATED RINGERS IV BOLUS
1000.0000 mL | Freq: Once | INTRAVENOUS | Status: AC
Start: 2023-11-17 — End: 2023-11-17
  Administered 2023-11-17: 1000 mL via INTRAVENOUS

## 2023-11-17 MED ORDER — SODIUM CHLORIDE 0.9 % IV SOLN
2.0000 g | INTRAVENOUS | Status: DC
  Administered 2023-11-18: 2 g via INTRAVENOUS
  Filled 2023-11-17: qty 20

## 2023-11-17 MED ORDER — DOXYCYCLINE HYCLATE 100 MG IV SOLR
100.0000 mg | Freq: Two times a day (BID) | INTRAVENOUS | Status: DC
  Administered 2023-11-17 – 2023-11-18 (×3): 100 mg via INTRAVENOUS
  Filled 2023-11-17 (×4): qty 100

## 2023-11-17 MED ORDER — LACTATED RINGERS IV BOLUS
1000.0000 mL | Freq: Once | INTRAVENOUS | Status: AC
  Administered 2023-11-17: 1000 mL via INTRAVENOUS

## 2023-11-17 MED ORDER — SODIUM CHLORIDE 0.9 % IV SOLN: 500.0000 mg | INTRAVENOUS | Status: DC

## 2023-11-17 MED ORDER — DOCUSATE SODIUM 100 MG PO CAPS
100.0000 mg | ORAL_CAPSULE | Freq: Two times a day (BID) | ORAL | Status: DC | PRN
Start: 2023-11-17 — End: 2023-11-18

## 2023-11-17 MED ORDER — BUSPIRONE HCL 10 MG PO TABS
10.0000 mg | ORAL_TABLET | Freq: Three times a day (TID) | ORAL | Status: DC
  Administered 2023-11-17 – 2023-11-18 (×2): 10 mg via ORAL
  Filled 2023-11-17 (×2): qty 1

## 2023-11-17 MED ORDER — SODIUM CHLORIDE 0.9 % IV SOLN
2.0000 g | Freq: Once | INTRAVENOUS | Status: AC
  Administered 2023-11-17: 2 g via INTRAVENOUS
  Filled 2023-11-17: qty 20

## 2023-11-17 MED ORDER — PANTOPRAZOLE SODIUM 40 MG IV SOLR
40.0000 mg | Freq: Every day | INTRAVENOUS | Status: DC
  Administered 2023-11-17 – 2023-11-18 (×2): 40 mg via INTRAVENOUS
  Filled 2023-11-17 (×2): qty 10

## 2023-11-17 MED ORDER — ORAL CARE MOUTH RINSE
15.0000 mL | OROMUCOSAL | Status: DC | PRN
Start: 2023-11-17 — End: 2023-11-18

## 2023-11-17 MED ORDER — ALBUTEROL SULFATE (2.5 MG/3ML) 0.083% IN NEBU
2.5000 mg | INHALATION_SOLUTION | RESPIRATORY_TRACT | Status: DC | PRN
  Administered 2023-11-17 (×2): 5 mg via RESPIRATORY_TRACT

## 2023-11-17 NOTE — ED Notes (Signed)
 EDP at [facility], tolerating bipap, remains RR high. Mag complete, rocephin infusing, IVF bolus infusing, BP improved.

## 2023-11-17 NOTE — ED Notes (Signed)
 Switched to high flow [place], will monitor

## 2023-11-17 NOTE — ED Notes (Signed)
 Resting comfortably on Bipap. RT called for CAT neb

## 2023-11-17 NOTE — ED Notes (Signed)
 Family x3 at [facility]. Pt resting, calm, NAD, BP and VSS/ improved, tolerating breathing with bipap, arousable and interactive to voice. Follows commands.

## 2023-11-17 NOTE — ED Provider Notes (Signed)
 [place] EMERGENCY DEPARTMENT AT [facility] Provider Note   CSN: [id number] Arrival date & time: 11/17/23  9366     History  Chief Complaint  Patient presents with   Respiratory Distress    Teresa Roberts is a 63 y.o. female.  Patient is a 63 year old female with a history of hypertension, hyperlipidemia, diastolic CHF, CAD status post stents, COPD on 8 L of oxygen  chronically at home who is presenting today by EMS due to being unresponsive in her home.  Patient's husband gives a history and reports for the last 2 days she has had cold-like symptoms with cough, congestion and has been using Mucinex  and OTC medications.  However overnight he started hearing her concentrator beeping like maybe it was out of oxygen  and he was unable to wake her up and called 911.  Paramedics report when fire arrived patient was breathing rapidly but unresponsive.  She required bag-valve-mask and initially was still unresponsive however prior to arrival to the emergency room patient regain consciousness.  She was given 2 nebs and route and upon arrival here patient was still having increased work of breathing, wheezing.  Sats have been greater then 95% on BiPAP since arrival.  Patient is responding currently and will occasionally answer questions.  Initially upon arrival patient was diaphoretic and cool.  Her husband denies any diarrhea, vomiting or decreased oral intake.  The history is provided by the patient.       Home Medications Prior to Admission medications   Medication Sig Start Date End Date Taking? Authorizing Provider  albuterol  (PROVENTIL ) (2.5 MG/3ML) 0.083% nebulizer solution Take 3 mLs (2.5 mg total) by nebulization every 6 (six) hours as needed for wheezing or shortness of breath. 12/13/22   Parrett, Madelin RAMAN, NP  albuterol  (VENTOLIN  HFA) 108 (90 Base) MCG/ACT inhaler Inhale 2 puffs into the lungs every 6 (six) hours as needed for wheezing or shortness of breath. 12/13/22   Parrett,  Madelin RAMAN, NP  alendronate  (FOSAMAX ) 70 MG tablet Take 1 tablet (70 mg total) by mouth once a week. Take with a full glass of water on an empty stomach. 10/06/23   Dolphus Reiter, MD  ARIPiprazole  (ABILIFY ) 5 MG tablet Take 1/2 tab for I week and than full tab daily 07/14/23   Arfeen, Leni DASEN, MD  aspirin  81 MG EC tablet Take 81 mg by mouth daily after lunch. 05/11/18   [provider]  busPIRone  (BUSPAR ) 10 MG tablet Take 1 tablet (10 mg total) by mouth 3 (three) times daily. 07/14/23   Arfeen, Leni DASEN, MD  dorzolamide-timolol (COSOPT) 22.3-6.8 MG/ML ophthalmic solution Place 1 drop into both eyes 2 (two) times daily.    [provider]  DULoxetine  (CYMBALTA ) 30 MG capsule Take 30 mg by mouth daily.    [provider]  DULoxetine  (CYMBALTA ) 60 MG capsule TAKE 1 CAPSULE BY MOUTH EVERY DAY 04/06/23   Emeline Search C, DO  esomeprazole (NEXIUM) 40 MG capsule Take 40 mg by mouth as needed.    [provider]  furosemide  (LASIX ) 40 MG tablet Take 1 tablet (40 mg total) by mouth daily. 12/02/22 12/02/23  Vannie Reche RAMAN, NP  gabapentin  (NEURONTIN ) 800 MG tablet Take 800 mg by mouth 2 (two) times daily. 04/25/23   Verena Mems, MD  latanoprost  (XALATAN ) 0.005 % ophthalmic solution Place 1 drop into both eyes at bedtime.    [provider]  metoprolol  succinate (TOPROL  XL) 25 MG 24 hr tablet Take 1 tablet (25 mg  total) by mouth daily. 12/02/22   Walker, Caitlin S, NP  OXYGEN  Inhale 6 L into the lungs continuous.    [provider]  pregabalin  (LYRICA ) 75 MG capsule Take 1 capsule (75 mg total) by mouth 2 (two) times daily. Patient not taking: Reported on 10/17/2023 03/09/23   Emeline Search C, DO  rizatriptan (MAXALT-MLT) 10 MG disintegrating tablet Take 10 mg by mouth as needed for migraine. May repeat in 2 hours if needed    [provider]  simvastatin  (ZOCOR ) 10 MG tablet Take 10 mg by mouth at bedtime.    [provider]  tiZANidine   (ZANAFLEX ) 4 MG tablet TAKE 1 TABLET BY MOUTH EVERY DAY AT BEDTIME AS NEEDED FOR MUSCLE SPASMS 05/26/23   Dolphus Reiter, MD  topiramate  (TOPAMAX ) 25 MG tablet Take 50 mg by mouth as needed.    [provider]  traZODone  (DESYREL ) 150 MG tablet Take 1 tablet (150 mg total) by mouth at bedtime. 07/14/23   Arfeen, Leni DASEN, MD  TRELEGY ELLIPTA  100-62.5-25 MCG/ACT AEPB TAKE 1 PUFF BY MOUTH EVERY DAY 06/08/23   Parrett, Tammy S, NP  Vitamin D , Ergocalciferol , (DRISDOL) 1.25 MG (50000 UNIT) CAPS capsule Take 50,000 Units by mouth once a week. 11/20/22   [provider]      Allergies    Citrus and Lisinopril    Review of Systems   Review of Systems  Physical Exam Updated Vital Signs BP (!) 92/54   Pulse 88   Temp 98.6 F (37 C) (Rectal)   Resp (!) 21   Ht 5' 6 (1.676 m)   Wt 83.6 kg   LMP 08/19/2012   SpO2 100%   BMI 29.75 kg/m  Physical Exam Vitals and nursing note reviewed.  Constitutional:      General: She is in acute distress.     Appearance: She is well-developed.  HENT:     Head: Normocephalic and atraumatic.  Eyes:     Pupils: Pupils are equal, round, and reactive to light.  Cardiovascular:     Rate and Rhythm: Regular rhythm. Tachycardia present.     Heart sounds: Normal heart sounds. No murmur heard.    No friction rub.  Pulmonary:     Effort: Tachypnea and accessory muscle usage present.     Breath sounds: Decreased breath sounds and wheezing present. No rales.  Abdominal:     General: Bowel sounds are normal. There is no distension.     Palpations: Abdomen is soft.     Tenderness: There is no abdominal tenderness. There is no guarding or rebound.  Musculoskeletal:        General: No tenderness. Normal range of motion.     Right lower leg: No edema.     Left lower leg: No edema.     Comments: No edema  Skin:    General: Skin is warm and dry.     Findings: No rash.  Neurological:     Mental Status: She is alert.     Cranial Nerves: No  cranial nerve deficit.     Motor: No weakness.     Comments: Patient is able to follow commands and answer some questions.  Appears to be moving arms and legs without difficulty  Psychiatric:     Comments: Calm and cooperative     ED Results / Procedures / Treatments   Labs (all labs ordered are listed, but only abnormal results are displayed) Labs Reviewed  CBC - Abnormal; Notable for the  following components:      Result Value   WBC 13.0 (*)    MCHC 28.9 (*)    RDW 21.5 (*)    nRBC 0.4 (*)    All other components within normal limits  COMPREHENSIVE METABOLIC PANEL - Abnormal; Notable for the following components:   Potassium 3.4 (*)    Glucose, Bld 170 (*)    Creatinine, Ser 1.82 (*)    GFR, Estimated 31 (*)    All other components within normal limits  I-STAT CG4 LACTIC ACID, ED - Abnormal; Notable for the following components:   Lactic Acid, Venous 3.4 (*)    All other components within normal limits  I-STAT VENOUS BLOOD GAS, ED - Abnormal; Notable for the following components:   pH, Ven 7.168 (*)    pCO2, Ven 106.5 (*)    pO2, Ven 30 (*)    Bicarbonate 38.7 (*)    TCO2 42 (*)    Acid-Base Excess 6.0 (*)    Potassium 3.4 (*)    All other components within normal limits  I-STAT VENOUS BLOOD GAS, ED - Abnormal; Notable for the following components:   pH, Ven 7.132 (*)    pCO2, Ven 100.7 (*)    Bicarbonate 33.7 (*)    TCO2 37 (*)    Sodium 146 (*)    Potassium 3.1 (*)    HCT 35.0 (*)    Hemoglobin 11.9 (*)    All other components within normal limits  CULTURE, BLOOD (ROUTINE X 2)  CULTURE, BLOOD (ROUTINE X 2)  RESP PANEL BY RT-PCR (RSV, FLU A&B, COVID)  RVPGX2  BRAIN NATRIURETIC PEPTIDE  TROPONIN I (HIGH SENSITIVITY)  TROPONIN I (HIGH SENSITIVITY)    EKG EKG Interpretation Date/Time:  Thursday November 17 2023 06:40:45 EST Ventricular Rate:  101 PR Interval:  107 QRS Duration:  106 QT Interval:  528 QTC Calculation: 682 R Axis:   77  Text  Interpretation: Sinus tachycardia Paired ventricular premature complexes Aberrant conduction of SV complex(es) Nonspecific T abnrm, anterolateral leads Prolonged QT interval Confirmed by Theadore Sharper [phone]) on 11/17/2023 6:46:23 AM  Radiology DG Chest Port 1 View Result Date: 11/17/2023 CLINICAL DATA:  63 year old female with history of shortness of breath. EXAM: PORTABLE CHEST 1 VIEW COMPARISON:  Chest x-ray 02/08/2022. FINDINGS: Image is slightly under penetrated. Lung volumes are normal. No consolidative airspace disease. No pleural effusions. No pneumothorax. No pulmonary nodule or mass noted. Pulmonary vasculature and the cardiomediastinal silhouette are within normal limits. Atherosclerosis in the thoracic aorta. IMPRESSION: 1.  No radiographic evidence of acute cardiopulmonary disease. 2. Aortic atherosclerosis. Electronically Signed   By: Toribio Aye M.D.   On: 11/17/2023 07:11    Procedures Procedures    Medications Ordered in ED Medications  albuterol  (PROVENTIL ) (2.5 MG/3ML) 0.083% nebulizer solution 2.5 mg (5 mg Nebulization Given 11/17/23 0712)  lactated ringers  infusion ( Intravenous New Bag/Given 11/17/23 0905)  ipratropium-albuterol  (DUONEB) 0.5-2.5 (3) MG/3ML nebulizer solution 3 mL (3 mLs Nebulization Given 11/17/23 0645)  methylPREDNISolone  sodium succinate (SOLU-MEDROL ) 125 mg/2 mL injection 125 mg (125 mg Intravenous Given 11/17/23 0659)  magnesium  sulfate IVPB 2 g 50 mL (0 g Intravenous Stopped 11/17/23 0741)  cefTRIAXone  (ROCEPHIN ) 2 g in sodium chloride  0.9 % 100 mL IVPB (0 g Intravenous Stopped 11/17/23 0818)  sodium chloride  0.9 % bolus 500 mL (0 mLs Intravenous Stopped 11/17/23 0818)  albuterol  (PROVENTIL ) (2.5 MG/3ML) 0.083% nebulizer solution (15 mg/hr Nebulization Given 11/17/23 0810)  lactated ringers  bolus 1,000 mL (0 mLs  Intravenous Stopped 11/17/23 0904)  lactated ringers  bolus 500 mL (500 mLs Intravenous Bolus from Bag 11/17/23 0931)    ED Course/ Medical Decision Making/  A&P                                 Medical Decision Making Amount and/or Complexity of Data Reviewed Independent Historian: spouse and EMS External Data Reviewed: notes. Labs: ordered. Decision-making details documented in ED Course. Radiology: ordered and independent interpretation performed. Decision-making details documented in ED Course. ECG/medicine tests: ordered and independent interpretation performed. Decision-making details documented in ED Course.  Risk Prescription drug management. Decision regarding hospitalization.   Pt with multiple medical problems and comorbidities and presenting today with a complaint that caries a high risk for morbidity and mortality.  Here today with being unresponsive at home, assumed to be hypoxic and unresponsive.  Patient requiring BiPAP at this time with diffuse wheezing and increased work of breathing.  This is in the setting of severe COPD on 8 L of oxygen  at baseline as well as recent URI symptoms.  Patient was initially hypothermic at 93 degrees axillary however rectal temperature is normal.  Concern for COPD exacerbation, hypoxia, sepsis, lower suspicion for PE, MI, dissection, pneumothorax.  Patient received 2 DuoNebs prior to arrival.  Upon arrival here patient received magnesium , Solu-Medrol , DuoNeb and was placed on continuous albuterol .  Currently she is responsive.  It was confirmed with patient's husband that she is a full code. I have independently visualized and interpreted pt's images today. Chest x-ray without evidence of pneumonia or pneumothorax.  I independently interpreted patient's labs and EKG.  EKG with no acute ST changes concerning for new ACS.  New prolonged QT on EKG. lactic acid elevated today at 3.4, VBG with a respiratory acidosis with a pH of 7.16 and CO2 of 106 with some metabolic compensation with a bicarb of 38, CBC with leukocytosis of 13 and normal hemoglobin, CMP with new AKI today with creatinine 1.82 from her  baseline of less than 1 and normal sodium levels and LFTs.  Initial troponin and BNP are normal.  After patient had been on BiPAP for over an hour with continuous albuterol  nebs she is still wheezing but work of breathing is improving.  She was covered with antibiotics given her abnormal lab values and 2 L of fluid were ordered based on her weight and she was started on continuous fluids after.  Repeat VBG showed a CO2 of 100 and a pH slightly worse at 7.13.  Will discuss patient's case with ICU.  CRITICAL CARE Performed by: Haniah Penny Total critical care time: 45 minutes Critical care time was exclusive of separately billable procedures and treating other patients. Critical care was necessary to treat or prevent imminent or life-threatening deterioration. Critical care was time spent personally by me on the following activities: development of treatment plan with patient and/or surrogate as well as nursing, discussions with consultants, evaluation of patient's response to treatment, examination of patient, obtaining history from patient or surrogate, ordering and performing treatments and interventions, ordering and review of laboratory studies, ordering and review of radiographic studies, pulse oximetry and re-evaluation of patient's condition.          Final Clinical Impression(s) / ED Diagnoses Final diagnoses:  COPD exacerbation (HCC)  Acute respiratory failure with hypercapnia (HCC)  Sepsis with acute renal failure without septic shock, due to unspecified organism, unspecified acute renal failure type (HCC)  Rx / DC Orders ED Discharge Orders     None         Doretha Folks, MD 11/17/23 [phone]

## 2023-11-17 NOTE — ED Notes (Signed)
 PCCM at [facility]. EDP at [facility]. RT at [facility]. Family at [facility]. Pt breathing betteer on Bipap, tolerating well. No anxiousness noted, RR high.

## 2023-11-17 NOTE — Sepsis Progress Note (Signed)
 Notified provider of need to order fluid bolus, pt needs 2508 cc.

## 2023-11-17 NOTE — Progress Notes (Addendum)
 eLink Physician-Brief Progress Note Patient Name: Teresa Roberts DOB: [birth year] MRN: [id number]   Date of Service  11/17/2023  HPI/Events of Note  Patient with respiratory failure and hypercapnia.  Has been intermittently refusing BiPAP therapy.  7 PM ABG appears relatively stable.  She wants all of her home meds restarted and can swallow appropriately on bedside eval.  She only wants to wear the BiPAP for few hours tonight the bed rather than the whole night as previously discussed.  eICU Interventions  Resume aripiprazole , buspirone , duloxetine  but hold gabapentin , tizanidine , trazodone  given tenuous respiratory status.   2210 - 5/10 epigastric pain/pressure.  EKG with no acute findings.  In no distress on camera evaluation.  Resting comfortably.  No intervention indicated at this time.  Intervention Category Intermediate Interventions: Respiratory distress - evaluation and management  Julio Storr 11/17/2023, 7:51 PM

## 2023-11-17 NOTE — Progress Notes (Addendum)
 Patient evaluated at bedside. She is off of Bipap with HFNC at 8L. Her mentation is at baseline, she is alert and oriented x3. She reports that Bipap/ Cpap masks make her anxious and she requested to be taken off in ED>   Last ABG with pH of 7.1 and CO in upper 80s.  Lactic acid trending up to 5.8 s/p 2.5 L LR bolus P: I talked with her about need to continue BiPAP for additional time to help get CO2 off. She is open to this. BiPap replaced Repeat ABG at 1900 Trend lactic acid Extend maintenance fluids tomorrow AM.  1600: Patient refusing Bipap mask with RT. Ambulated to bathroom on own without O2 and sats in 50s. She O2 sats returned to normal with 8L [place].   I talked with her about risk of CO2 retention and concerns that she came in with very high CO2 and we were initially concerned about her needing intubation. We agreed to go ahead and check VBG now. If CO2 remains high she is agreeable to putting mask back on.

## 2023-11-17 NOTE — ED Notes (Signed)
 When Husband arrived he advised she is normally on 6-8 L O2 and knows her concentrator was having issues / not working.

## 2023-11-17 NOTE — ED Notes (Signed)
 ED TO INPATIENT HANDOFF REPORT  ED Nurse Name and Phone #: Thom Ahle, RN [phone]  S Name/Age/Gender Teresa Roberts 63 y.o. female Room/Bed: TRAAC/TRAAC  Code Status   Code Status: Full Code  Home/SNF/Other Home Patient oriented to: self, place, time, and situation Is this baseline? Yes   Triage Complete: Triage complete  Chief Complaint Acute and chronic respiratory failure (acute-on-chronic) (HCC) [J96.20]  Triage Note Pt BIB [facility] d/t being unresponsive. Fire Dept began using BVM and pt was pale, diaphoretic and had Kussmal breathing.  Pt had CBG of 394 but not Diabetic.  Pt had wheezes with EMS so they gave a Duoneb.  At that time she was responsive to pain and then spontaneous.     Allergies Allergies  Allergen Reactions   Citrus Hives   Lisinopril Cough    Level of Care/Admitting Diagnosis ED Disposition     ED Disposition  Admit   Condition  --   Comment  Hospital [facility] [[id number]]  Level of Care: ICU [6]  May admit patient to [name] or [facility] if equivalent level of care is available:: Yes  Covid Evaluation: Symptomatic Person Under Investigation (PUI) or recent exposure (last 10 days) *Testing Required*  Diagnosis: Acute and chronic respiratory failure (acute-on-chronic) [facility]) [[id number]]  Admitting Physician: GERONIMO AMEL [phone]  Attending Physician: RAMASWAMY, MURALI [phone]  Certification:: I certify this patient will need inpatient services for at least 2 midnights  Expected Medical Readiness: 11/23/2023          B Medical/Surgery History Past Medical History:  Diagnosis Date   CAD (coronary artery disease)    a. 01/2017: cath showing 20% mid RCA stenosis and otherwise normal LAD and LCx with a preserved EF of 55 to 60%; mod pulmonary HTN   Carpal tunnel syndrome    COPD (chronic obstructive pulmonary disease) (HCC)    O2 dependent   cpap    Depression    PTSD   Diastolic CHF (HCC)    Fibromyalgia     Hyperlipidemia    Hypertension    Migraines    Osteoporosis    Sciatic nerve disease    Past Surgical History:  Procedure Laterality Date   APPENDECTOMY     BIOPSY  01/01/2021   Procedure: BIOPSY;  Surgeon: Dianna Specking, MD;  Location: [facility];  Service: Endoscopy;;   CESAREAN SECTION     COLONOSCOPY WITH PROPOFOL  N/A 01/01/2021   Procedure: COLONOSCOPY WITH PROPOFOL ;  Surgeon: Dianna Specking, MD;  Location: [facility];  Service: Endoscopy;  Laterality: N/A;   ESOPHAGOGASTRODUODENOSCOPY (EGD) WITH PROPOFOL  N/A 01/01/2021   Procedure: ESOPHAGOGASTRODUODENOSCOPY (EGD) WITH PROPOFOL ;  Surgeon: Dianna Specking, MD;  Location: [facility];  Service: Endoscopy;  Laterality: N/A;   ORIF ANKLE FRACTURE Left 08/12/2017   Procedure: OPEN REDUCTION INTERNAL FIXATION (ORIF) ANKLE FRACTURE;  Surgeon: Fidel Rogue, MD;  Location: [facility] OR;  Service: Orthopedics;  Laterality: Left;   RIGHT HEART CATH N/A 07/19/2018   Procedure: RIGHT HEART CATH;  Surgeon: Cherrie Toribio SAUNDERS, MD;  Location: [facility] INVASIVE CV LAB;  Service: Cardiovascular;  Laterality: N/A;   RIGHT/LEFT HEART CATH AND CORONARY ANGIOGRAPHY N/A 01/27/2017   Procedure: Right/Left Heart Cath and Coronary Angiography;  Surgeon: Debby DELENA Sor, MD;  Location: [facility] INVASIVE CV LAB;  Service: Cardiovascular;  Laterality: N/A;     A IV Location/Drains/Wounds Patient Lines/Drains/Airways Status     Active Line/Drains/Airways     Name Placement date Placement time Site Days   Peripheral IV 11/17/23  18 G 1 Anterior;Distal;Left;Upper Arm 11/17/23  0611  Arm  less than 1   Peripheral IV 11/17/23 18 G 1.88 Right Antecubital 11/17/23  0744  Antecubital  less than 1            Intake/Output Last 24 hours  Intake/Output Summary (Last 24 hours) at 11/17/2023 1339 Last data filed at 11/17/2023 1238 Gross per 24 hour  Intake 3950 ml  Output --  Net 3950 ml    Labs/Imaging Results for orders placed or performed during the hospital  encounter of 11/17/23 (from the past 48 hours)  CBC     Status: Abnormal   Collection Time: 11/17/23  6:50 AM  Result Value Ref Range   WBC 13.0 (H) 4.0 - 10.5 K/uL   RBC 4.62 3.87 - 5.11 MIL/uL   Hemoglobin 12.4 12.0 - 15.0 g/dL   HCT 57.0 63.9 - 53.9 %   MCV 92.9 80.0 - 100.0 fL   MCH 26.8 26.0 - 34.0 pg   MCHC 28.9 (L) 30.0 - 36.0 g/dL   RDW 78.4 (H) 88.4 - 84.4 %   Platelets 219 150 - 400 K/uL   nRBC 0.4 (H) 0.0 - 0.2 %    Comment: Performed at [facility], [address], [place], [place] [address]  Comprehensive metabolic panel     Status: Abnormal   Collection Time: 11/17/23  6:50 AM  Result Value Ref Range   Sodium 144 135 - 145 mmol/L   Potassium 3.4 (L) 3.5 - 5.1 mmol/L   Chloride 99 98 - 111 mmol/L   CO2 32 22 - 32 mmol/L   Glucose, Bld 170 (H) 70 - 99 mg/dL    Comment: Glucose reference range applies only to samples taken after fasting for at least 8 hours.   BUN 18 8 - 23 mg/dL   Creatinine, Ser 8.17 (H) 0.44 - 1.00 mg/dL   Calcium  10.1 8.9 - 10.3 mg/dL   Total Protein 7.1 6.5 - 8.1 g/dL   Albumin 3.5 3.5 - 5.0 g/dL   AST 22 15 - 41 U/L   ALT 15 0 - 44 U/L   Alkaline Phosphatase 95 38 - 126 U/L   Total Bilirubin 0.3 0.0 - 1.2 mg/dL   GFR, Estimated 31 (L) >60 mL/min    Comment: (NOTE) Calculated using the CKD-EPI Creatinine Equation (2021)    Anion gap 13 5 - 15    Comment: Performed at [facility], [address], [place], [place] [address]  Troponin I (High Sensitivity)     Status: None   Collection Time: 11/17/23  6:50 AM  Result Value Ref Range   Troponin I (High Sensitivity) 13 <18 ng/L    Comment: (NOTE) Elevated high sensitivity troponin I (hsTnI) values and significant  changes across serial measurements may suggest ACS but many other  chronic and acute conditions are known to elevate hsTnI results.  Refer to the Links section for chest pain algorithms and additional  guidance. Performed at [facility], [address],  [place], [place] [address]   Brain natriuretic peptide     Status: None   Collection Time: 11/17/23  6:50 AM  Result Value Ref Range   B Natriuretic Peptide 34.0 0.0 - 100.0 pg/mL    Comment: Performed at [facility], [address], [place], [place] [address]  Resp panel by RT-PCR (RSV, Flu A&B, Covid) Anterior Nasal Swab     Status: Abnormal   Collection Time:  11/17/23  7:19 AM   Specimen: Anterior Nasal Swab  Result Value Ref Range   SARS Coronavirus 2 by RT PCR NEGATIVE NEGATIVE   Influenza A by PCR NEGATIVE NEGATIVE   Influenza B by PCR NEGATIVE NEGATIVE    Comment: (NOTE) The Xpert Xpress SARS-CoV-2/FLU/RSV plus assay is intended as an aid in the diagnosis of influenza from Nasopharyngeal swab specimens and should not be used as a sole basis for treatment. Nasal washings and aspirates are unacceptable for Xpert Xpress SARS-CoV-2/FLU/RSV testing.  Fact Sheet for Patients: bloggercourse.com  Fact Sheet for Healthcare Providers: seriousbroker.it  This test is not yet approved or cleared by the [place]  FDA and has been authorized for detection and/or diagnosis of SARS-CoV-2 by FDA under an Emergency Use Authorization (EUA). This EUA will remain in effect (meaning this test can be used) for the duration of the COVID-19 declaration under Section 564(b)(1) of the Act, 21 U.S.C. section 360bbb-3(b)(1), unless the authorization is terminated or revoked.     Resp Syncytial Virus by PCR POSITIVE (A) NEGATIVE    Comment: (NOTE) Fact Sheet for Patients: bloggercourse.com  Fact Sheet for Healthcare Providers: seriousbroker.it  This test is not yet approved or cleared by the [place]  FDA and has been authorized for detection and/or diagnosis of SARS-CoV-2 by FDA under an Emergency Use Authorization (EUA). This EUA will remain in effect (meaning this test can be used)  for the duration of the COVID-19 declaration under Section 564(b)(1) of the Act, 21 U.S.C. section 360bbb-3(b)(1), unless the authorization is terminated or revoked.  Performed at [facility], [address], [place], [place] [address]   I-Stat venous blood gas, [facility] ED, [facility], [facility])     Status: Abnormal   Collection Time: 11/17/23  7:42 AM  Result Value Ref Range   pH, Ven 7.168 (LL) 7.25 - 7.43   pCO2, Ven 106.5 (HH) 44 - 60 mmHg   pO2, Ven 30 (LL) 32 - 45 mmHg   Bicarbonate 38.7 (H) 20.0 - 28.0 mmol/L   TCO2 42 (H) 22 - 32 mmol/L   O2 Saturation 38 %   Acid-Base Excess 6.0 (H) 0.0 - 2.0 mmol/L   Sodium 144 135 - 145 mmol/L   Potassium 3.4 (L) 3.5 - 5.1 mmol/L   Calcium , Ion 1.28 1.15 - 1.40 mmol/L   HCT 41.0 36.0 - 46.0 %   Hemoglobin 13.9 12.0 - 15.0 g/dL   Sample type VENOUS    Comment NOTIFIED PHYSICIAN   I-Stat CG4 Lactic Acid     Status: Abnormal   Collection Time: 11/17/23  7:43 AM  Result Value Ref Range   Lactic Acid, Venous 3.4 (HH) 0.5 - 1.9 mmol/L   Comment NOTIFIED PHYSICIAN   Troponin I (High Sensitivity)     Status: None   Collection Time: 11/17/23  8:45 AM  Result Value Ref Range   Troponin I (High Sensitivity) 14 <18 ng/L    Comment: (NOTE) Elevated high sensitivity troponin I (hsTnI) values and significant  changes across serial measurements may suggest ACS but many other  chronic and acute conditions are known to elevate hsTnI results.  Refer to the Links section for chest pain algorithms and additional  guidance. Performed at [facility] Lab, [address], [place], [place] [address]   I-Stat venous blood gas, [facility] ED, [facility], [facility])     Status: Abnormal   Collection Time: 11/17/23  9:11 AM  Result Value Ref Range   pH, Ven 7.132 (LL) 7.25 -  7.43   pCO2, Ven 100.7 (HH) 44 - 60 mmHg   pO2, Ven 38 32 - 45 mmHg   Bicarbonate 33.7 (H) 20.0 - 28.0 mmol/L   TCO2 37 (H) 22 - 32 mmol/L   O2 Saturation 51 %   Acid-Base Excess 2.0 0.0 - 2.0 mmol/L    Sodium 146 (H) 135 - 145 mmol/L   Potassium 3.1 (L) 3.5 - 5.1 mmol/L   Calcium , Ion 1.22 1.15 - 1.40 mmol/L   HCT 35.0 (L) 36.0 - 46.0 %   Hemoglobin 11.9 (L) 12.0 - 15.0 g/dL   Sample type VENOUS   I-Stat CG4 Lactic Acid     Status: Abnormal   Collection Time: 11/17/23 10:04 AM  Result Value Ref Range   Lactic Acid, Venous 3.6 (HH) 0.5 - 1.9 mmol/L   Comment NOTIFIED PHYSICIAN   Respiratory (~20 pathogens) panel by PCR     Status: Abnormal   Collection Time: 11/17/23 10:47 AM   Specimen: Nasopharyngeal Swab; Respiratory  Result Value Ref Range   Adenovirus NOT DETECTED NOT DETECTED   Coronavirus 229E NOT DETECTED NOT DETECTED    Comment: (NOTE) The Coronavirus on the Respiratory Panel, DOES NOT test for the novel  Coronavirus (2019 nCoV)    Coronavirus HKU1 NOT DETECTED NOT DETECTED   Coronavirus NL63 NOT DETECTED NOT DETECTED   Coronavirus OC43 NOT DETECTED NOT DETECTED   Metapneumovirus NOT DETECTED NOT DETECTED   Rhinovirus / Enterovirus NOT DETECTED NOT DETECTED   Influenza A NOT DETECTED NOT DETECTED   Influenza B NOT DETECTED NOT DETECTED   Parainfluenza Virus 1 NOT DETECTED NOT DETECTED   Parainfluenza Virus 2 NOT DETECTED NOT DETECTED   Parainfluenza Virus 3 NOT DETECTED NOT DETECTED   Parainfluenza Virus 4 NOT DETECTED NOT DETECTED   Respiratory Syncytial Virus DETECTED (A) NOT DETECTED   Bordetella pertussis NOT DETECTED NOT DETECTED   Bordetella Parapertussis NOT DETECTED NOT DETECTED   Chlamydophila pneumoniae NOT DETECTED NOT DETECTED   Mycoplasma pneumoniae NOT DETECTED NOT DETECTED    Comment: Performed at [facility], [address] [place], [place] [address]  I-Stat arterial blood gas, ED     Status: Abnormal   Collection Time: 11/17/23 11:06 AM  Result Value Ref Range   pH, Arterial 7.192 (LL) 7.35 - 7.45   pCO2 arterial 87.7 (HH) 32 - 48 mmHg   pO2, Arterial 59 (L) 83 - 108 mmHg   Bicarbonate 33.6 (H) 20.0 - 28.0 mmol/L   TCO2 36 (H) 22 - 32  mmol/L   O2 Saturation 81 %   Acid-Base Excess 3.0 (H) 0.0 - 2.0 mmol/L   Sodium 145 135 - 145 mmol/L   Potassium 3.2 (L) 3.5 - 5.1 mmol/L   Calcium , Ion 1.27 1.15 - 1.40 mmol/L   HCT 34.0 (L) 36.0 - 46.0 %   Hemoglobin 11.6 (L) 12.0 - 15.0 g/dL   Patient temperature 01.3 F    Collection site RADIAL, ALLEN'S TEST ACCEPTABLE    Drawn by RT    Sample type ARTERIAL    Comment NOTIFIED PHYSICIAN   CBC     Status: Abnormal   Collection Time: 11/17/23  1:00 PM  Result Value Ref Range   WBC 9.5 4.0 - 10.5 K/uL   RBC 3.79 (L) 3.87 - 5.11 MIL/uL   Hemoglobin 10.3 (L) 12.0 - 15.0 g/dL   HCT 64.1 (L) 63.9 - 53.9 %   MCV 94.5 80.0 - 100.0 fL   MCH 27.2 26.0 - 34.0  pg   MCHC 28.8 (L) 30.0 - 36.0 g/dL   RDW 78.5 (H) 88.4 - 84.4 %   Platelets 165 150 - 400 K/uL   nRBC 0.0 0.0 - 0.2 %    Comment: Performed at [facility], [address], [place], [place] [address]   DG Chest Port 1 View Result Date: 11/17/2023 CLINICAL DATA:  63 year old female with history of shortness of breath. EXAM: PORTABLE CHEST 1 VIEW COMPARISON:  Chest x-ray 02/08/2022. FINDINGS: Image is slightly under penetrated. Lung volumes are normal. No consolidative airspace disease. No pleural effusions. No pneumothorax. No pulmonary nodule or mass noted. Pulmonary vasculature and the cardiomediastinal silhouette are within normal limits. Atherosclerosis in the thoracic aorta. IMPRESSION: 1.  No radiographic evidence of acute cardiopulmonary disease. 2. Aortic atherosclerosis. Electronically Signed   By: Toribio Aye M.D.   On: 11/17/2023 07:11    Pending Labs Unresulted Labs (From admission, onward)     Start     Ordered   11/18/23 0500  CBC  Tomorrow morning,   R        11/17/23 1054   11/18/23 0500  Basic metabolic panel  Tomorrow morning,   R        11/17/23 1054   11/18/23 0500  Blood gas, arterial  Tomorrow morning,   R        11/17/23 1054   11/18/23 0500  Magnesium   Tomorrow morning,   R        11/17/23  1054   11/18/23 0500  Phosphorus  Tomorrow morning,   R        11/17/23 1054   11/18/23 0500  Procalcitonin  Tomorrow morning,   R       References:    Procalcitonin Lower Respiratory Tract Infection AND Sepsis Procalcitonin Algorithm   11/17/23 1055   11/17/23 1127  Urinalysis, Routine w reflex microscopic -Urine, Clean Catch  Once,   R       Question:  Specimen Source  Answer:  Urine, Clean Catch   11/17/23 1136   11/17/23 1057  SARS Coronavirus 2 by RT PCR (hospital order, performed in [facility] hospital lab) *cepheid single result test* Anterior Nasal Swab  (Tier 2 - SARS Coronavirus 2 by RT PCR (hospital order, performed in [facility] hospital lab) *cepheid single result test*)  Once,   R        11/17/23 1057   11/17/23 1054  Strep pneumoniae urinary antigen (not at [facility])  Once,   R        11/17/23 1054   11/17/23 1054  Hemoglobin A1c  (Glycemic Control (SSI)  Q 4 Hours / Glycemic Control (SSI)  AC +/- HS)  Once,   R       Comments: To assess prior glycemic control    11/17/23 1054   11/17/23 1052  Creatinine, serum  (heparin )  Once,   R       Comments: Baseline for heparin  therapy IF NOT ALREADY DRAWN.    11/17/23 1054   11/17/23 1050  Respiratory (~20 pathogens) panel by PCR  (Respiratory panel by PCR (~20 pathogens, ~24 hr TAT)  w precautions)  Once,   R        11/17/23 1054   11/17/23 1049  HIV Antibody (routine testing w rflx)  (HIV Antibody (Routine testing w reflex) panel)  Once,   R        11/17/23 1054   11/17/23 1047  Blood gas, arterial  ONCE - STAT,   STAT        11/17/23 1046   11/17/23 1000  Culture, blood (Routine X 2) w Reflex to ID Panel  Once,   R        11/17/23 1000   11/17/23 0714  Culture, blood (Routine X 2) w Reflex to ID Panel  BLOOD CULTURE X 2,   R (with STAT occurrences)      11/17/23 0713            Vitals/Pain Today's Vitals   11/17/23 1200 11/17/23 1201 11/17/23 1215 11/17/23 1230  BP: 104/62  107/70 110/70  Pulse: 78  83 83  Resp: (!)  21  13 17   Temp:      TempSrc:      SpO2: 100% 100% 100% 100%  Weight:      Height:      PainSc:        Isolation Precautions Airborne and Contact precautions  Medications Medications  albuterol  (PROVENTIL ) (2.5 MG/3ML) 0.083% nebulizer solution 2.5 mg (5 mg Nebulization Given 11/17/23 0712)  docusate sodium  (COLACE) capsule 100 mg (has no administration in time range)  polyethylene glycol (MIRALAX  / GLYCOLAX ) packet 17 g (has no administration in time range)  dextrose  5 % and 0.45 % NaCl infusion (has no administration in time range)  heparin  injection 5,000 Units (has no administration in time range)  insulin  aspart (novoLOG ) injection 0-20 Units (has no administration in time range)  pantoprazole  (PROTONIX ) injection 40 mg (40 mg Intravenous Given 11/17/23 1252)  methylPREDNISolone  sodium succinate (SOLU-MEDROL ) 125 mg/2 mL injection 80 mg (80 mg Intravenous Given 11/17/23 1252)  cefTRIAXone  (ROCEPHIN ) 2 g in sodium chloride  0.9 % 100 mL IVPB (has no administration in time range)  ipratropium-albuterol  (DUONEB) 0.5-2.5 (3) MG/3ML nebulizer solution 3 mL (3 mLs Nebulization Given 11/17/23 1201)  doxycycline  (VIBRAMYCIN ) 100 mg in dextrose  5 % 250 mL IVPB (100 mg Intravenous New Bag/Given 11/17/23 1147)  ipratropium-albuterol  (DUONEB) 0.5-2.5 (3) MG/3ML nebulizer solution 3 mL (3 mLs Nebulization Given 11/17/23 0645)  methylPREDNISolone  sodium succinate (SOLU-MEDROL ) 125 mg/2 mL injection 125 mg (125 mg Intravenous Given 11/17/23 0659)  magnesium  sulfate IVPB 2 g 50 mL (0 g Intravenous Stopped 11/17/23 0741)  cefTRIAXone  (ROCEPHIN ) 2 g in sodium chloride  0.9 % 100 mL IVPB (0 g Intravenous Stopped 11/17/23 0818)  sodium chloride  0.9 % bolus 500 mL (0 mLs Intravenous Stopped 11/17/23 0818)  albuterol  (PROVENTIL ) (2.5 MG/3ML) 0.083% nebulizer solution (15 mg/hr Nebulization Given 11/17/23 0810)  lactated ringers  bolus 1,000 mL (0 mLs Intravenous Stopped 11/17/23 0904)  lactated ringers  bolus 500 mL (0 mLs  Intravenous Stopped 11/17/23 0948)  lactated ringers  bolus 1,000 mL (0 mLs Intravenous Stopped 11/17/23 1145)  lactated ringers  bolus 500 mL (0 mLs Intravenous Stopped 11/17/23 1237)  potassium chloride  10 mEq in 100 mL IVPB (10 mEq Intravenous New Bag/Given 11/17/23 1239)    Mobility walks     Focused Assessments  Pulmonary Assessment Handoff:  Lung sounds: Bilateral Breath Sounds: Diminished, Expiratory wheezes O2 Device: Bi-PAP      R Recommendations: See Admitting Provider Note  Report given to:   Additional Notes: breathing comfortably on Bipap, arousable and interactive to voice

## 2023-11-17 NOTE — Sepsis Progress Note (Signed)
Notified provider of need to order repeat lactic acid (#3). 

## 2023-11-17 NOTE — Progress Notes (Signed)
 Pt removed HFNC and oob to bathroom and without staff assistance. Oxygen  saturation down to 60s. RN educated pt on importance of calling for staff before getting oob and that the O2 needs to stay on with ambulation. Pt agreeable. Pt also requesting for food and drink. Pt educated on aspiration risk associated with bipap use. Per K Masters, DO OK for pt to have small sips of water, but remain NPO. Pt and family members aware.

## 2023-11-17 NOTE — ED Notes (Signed)
 BP improved, VSS, EDP at [facility] speaking with family. Pt tolerating Bipap.

## 2023-11-17 NOTE — Plan of Care (Signed)
  Problem: Metabolic: Goal: Ability to maintain appropriate glucose levels will improve Outcome: Progressing   Problem: Skin Integrity: Goal: Risk for impaired skin integrity will decrease Outcome: Progressing   Problem: Coping: Goal: Ability to adjust to condition or change in health will improve Outcome: Not Progressing

## 2023-11-17 NOTE — ED Provider Notes (Signed)
 I evaluated and managed the patient up on arrival.  Unresponsive at home, rapid breathing, took a while for them to wake up with EMS despite multiple sternal rubs.  Possibly fell asleep without her 8 L of baseline oxygen .  Has had a cold recently.  Waking up now, very tight in the chest, suspect COPD exacerbation, also considering sepsis.  Care to be assumed by oncoming provider.  Orders placed.   Theadore Ozell HERO, MD 11/17/23 [phone]

## 2023-11-17 NOTE — Sepsis Progress Note (Signed)
 Elink monitoring for the code sepsis protocol.

## 2023-11-17 NOTE — ED Triage Notes (Signed)
 Pt BIB [facility] d/t being unresponsive. Fire Dept began using BVM and pt was pale, diaphoretic and had Kussmal breathing.  Pt had CBG of 394 but not Diabetic.  Pt had wheezes with EMS so they gave a Duoneb.  At that time she was responsive to pain and then spontaneous.

## 2023-11-17 NOTE — Progress Notes (Addendum)
     PM rounds   S: he is now awak and alet an d on o2. REfusing bipap  Plan  Get 7pm abg - > encourag her to use bipap ASk triad to pick up tomorrow >7am - message sent to placement  Needs opd pulm followu-p - meesage sent to office schedulers   CCM can stay on as consult - message sent to Dr Sharyne -awaiting aleatha EN[place]    Dr. Dorethia Cave, M.D., F.C.C.P,  Pulmonary and Critical Care Medicine Staff Physician, [facility] Center Director - Interstitial Lung Disease  Program  Pulmonary Fibrosis [facility] at [facility], [place], [address]   Pager: [phone], If no answer  -> Check AMION or Try [phone] Telephone (clinical office): [phone] Telephone (research): [phone]  6:11 PM 11/17/2023

## 2023-11-17 NOTE — Progress Notes (Signed)
 RT gave 1600 scheduled breathing treatment. Patient states she doesn't want to go back on bipap at this time. Vitals are stable at this time.

## 2023-11-17 NOTE — ED Notes (Signed)
 Trialled off of Bipap per request, after coughing spell. Mouth, lips and tongue dry and cracked. Requesting drink. Bipap removed, RT at [facility].

## 2023-11-17 NOTE — H&P (Signed)
 NAME:  Teresa Roberts, MRN:  [id number], DOB:  11/30/1960, LOS: 0 ADMISSION DATE:  11/17/2023, CONSULTATION DATE:  11/17/23 REFERRING MD:  [facility] EDP, CHIEF COMPLAINT:  respiratory failure   History of Present Illness:  Teresa Roberts is a 63 year old with PMH pf HTN, HLD, HFpEF, nonobstructive CAD, COPD on 8 L at home, microcytic anemia who presents with respiratory distress from home.  She was found unresponsive at home by her husband around [facility] 01/02.  Concentrator beeping overnight and husband not able to wake her up so called 911. On EMS arrival she was unresponsive and required bag-valve-mask. She became more responsive.  Initially temperature reading at 93 F, however rectal temp within normal limits. In ED, lab work showed WBC 13 with Hgb at 12, lactic acid 3.4, BNP 34, troponin 13, VBG 7.1/ CO2 106/ bicarb 33. EKG with QTC>600. CXR with without focal consolidation or edema.  PCCM consulted with respiratory failure requiring BiPAP.   Pertinent  Medical History  COPD on 8 L at home follows with Dr. Jude, fibromyalgia, CAD s/p   Last pulm ov 12/13/22  Significant Hospital Events: Including procedures, antibiotic start and stop dates in addition to other pertinent events   01/02- unresponsive in field but mentation improved with bagging enroute. VBG with pH 7.1 and CO2 >100.   Interim History / Subjective:  Patient's husband and family at bedside. They confirm that she has had cold for last few days. She does not use CPAP at home.  Patient wakes with stimuli but does not follow commands.  Objective   Blood pressure (!) 92/54, pulse 88, temperature 98.6 F (37 C), temperature source Rectal, resp. rate (!) 21, height 5' 6 (1.676 m), weight 83.6 kg, last menstrual period 08/19/2012, SpO2 100%.    FiO2 (%):  [50 %-100 %] 50 % PEEP:  [6 cmH20] 6 cmH20   Intake/Output Summary (Last 24 hours) at 11/17/2023 1008 Last data filed at 11/17/2023 0948 Gross per 24 hour  Intake 2150 ml  Output --   Net 2150 ml   Filed Weights   11/17/23 0638  Weight: 83.6 kg    Examination: General appearance: somnolent, awakes to touch  Eyes: anicteric sclerae, moist conjunctivae; no lid-lag; PERRLA, tracking appropriately Lungs: diffuse crackling in bilateral lungs, BiPap in place, no using accessory muscles CV: RRR, S1, S2, no MRGs  Abdomen: Soft, non-tender; non-distended, BS present  Extremities: No peripheral edema  Neuro: somnolent, opens eyes to touch  EKG  Sinus tachycardia, no ST changes, Prolonged QTC 682  WBC 13  Hgb at 12 lactic acid 3.4 BNP 34 troponin 13 VBG 7.1/ CO2 106/ bicarb 33.  EKG with QTC>600. CXR with without focal consolidation or edema.  Resolved Hospital Problem list     Assessment & Plan:   Acute encephalopathy Acute on chronic hypoxemic and hypercarbic respiratory failure on 8L O2 at home COPD exacerbation OSA RSV + Respiratory acidosis Patient presenting after several days of cold like symptoms. Per husband she has CPAP at home but does not use regularly. Oxygen  Concentrator stopped working this AM.   Most likely encephalopathy from COPD exacerbation with RSV infection and OSA c/b hypercarbia.  She is high risk for intubation if she does not rapidly improve.   Home inhaler with trelegy.   -urine strep -respiratory virus panel with RSV+ -recheck ABG now  -triple therapy scheduled nebulizer with duoneb -steroids with solumedrol 80 mg q 12 hrs -start IV ceftriaxone  and doxycyline -airborne and contact precautions  AKI  Baseline creatinine 1.00. Creatinine elevated at 1.82. Will get UA for casts. Thinking most likely pre renal azotemia with decreased intake from cold. S/p 2.5L fluids.  -strict I and os -UA for casts  Non obstructive CAD Home medication includes asa 91 mg and simvastatin  10 mg  -echo pending -trend troponin  Hypokalemia K 3.4 given 10 mq K.  Microcytic anemia Follows with oncology. Recently transfused RBCs. Hgb  improved to 12 from recent in 11/24 at 6.6.  Chronic stable: HFpEF HTN Home meds include lasix  40 mg qd  Fibromyalgia Home medication includes cymbalta , gabapentin , zanaflex , and trazodone . -hold home meds    Best Practice (right click and Reselect all SmartList Selections daily)   Diet/type: NPO DVT prophylaxis LMWH Pressure ulcer(s): N/A GI prophylaxis: PPI Lines: N/A Foley:  N/A Code Status:  full code Last date of multidisciplinary goals of care discussion [updated at bedside]  Teresa Roberts, D.O.  Internal Medicine Resident, PGY-3 [facility]  Pager: [phone] 10:09 AM, 11/17/2023

## 2023-11-17 NOTE — Progress Notes (Signed)
 Latest Reference Range & Units 11/17/23 17:15  pCO2, Ven 44 - 60 mmHg 76 (HH)  (HH): Data is critically high  [employer], DO notified via face to face.

## 2023-11-17 NOTE — ED Notes (Signed)
Phlebotomy unable to get blood  

## 2023-11-18 ENCOUNTER — Other Ambulatory Visit (HOSPITAL_COMMUNITY): Payer: Self-pay

## 2023-11-18 ENCOUNTER — Encounter (HOSPITAL_COMMUNITY): Payer: Self-pay | Admitting: Internal Medicine

## 2023-11-18 ENCOUNTER — Encounter: Payer: Self-pay | Admitting: Hematology

## 2023-11-18 ENCOUNTER — Ambulatory Visit (HOSPITAL_BASED_OUTPATIENT_CLINIC_OR_DEPARTMENT_OTHER): Payer: Medicare Other | Admitting: Family

## 2023-11-18 ENCOUNTER — Other Ambulatory Visit (HOSPITAL_COMMUNITY): Payer: Self-pay | Admitting: Psychiatry

## 2023-11-18 ENCOUNTER — Inpatient Hospital Stay (HOSPITAL_COMMUNITY): Payer: Medicare Other

## 2023-11-18 DIAGNOSIS — J9621 Acute and chronic respiratory failure with hypoxia: Secondary | ICD-10-CM | POA: Diagnosis not present

## 2023-11-18 DIAGNOSIS — R0603 Acute respiratory distress: Secondary | ICD-10-CM | POA: Diagnosis not present

## 2023-11-18 DIAGNOSIS — B338 Other specified viral diseases: Secondary | ICD-10-CM | POA: Diagnosis not present

## 2023-11-18 DIAGNOSIS — J441 Chronic obstructive pulmonary disease with (acute) exacerbation: Secondary | ICD-10-CM | POA: Diagnosis not present

## 2023-11-18 DIAGNOSIS — J9611 Chronic respiratory failure with hypoxia: Secondary | ICD-10-CM

## 2023-11-18 DIAGNOSIS — F331 Major depressive disorder, recurrent, moderate: Secondary | ICD-10-CM

## 2023-11-18 DIAGNOSIS — F4312 Post-traumatic stress disorder, chronic: Secondary | ICD-10-CM

## 2023-11-18 DIAGNOSIS — Z9981 Dependence on supplemental oxygen: Secondary | ICD-10-CM

## 2023-11-18 DIAGNOSIS — I5032 Chronic diastolic (congestive) heart failure: Secondary | ICD-10-CM | POA: Diagnosis not present

## 2023-11-18 DIAGNOSIS — Z720 Tobacco use: Secondary | ICD-10-CM

## 2023-11-18 DIAGNOSIS — J9622 Acute and chronic respiratory failure with hypercapnia: Secondary | ICD-10-CM

## 2023-11-18 DIAGNOSIS — I10 Essential (primary) hypertension: Secondary | ICD-10-CM

## 2023-11-18 LAB — URINALYSIS, ROUTINE W REFLEX MICROSCOPIC
Bilirubin Urine: NEGATIVE
Glucose, UA: NEGATIVE mg/dL
Hgb urine dipstick: NEGATIVE
Ketones, ur: 5 mg/dL — AB
Leukocytes,Ua: NEGATIVE
Nitrite: NEGATIVE
Protein, ur: 30 mg/dL — AB
Specific Gravity, Urine: 1.017 (ref 1.005–1.030)
pH: 5 (ref 5.0–8.0)

## 2023-11-18 LAB — GLUCOSE, CAPILLARY
Glucose-Capillary: 149 mg/dL — ABNORMAL HIGH (ref 70–99)
Glucose-Capillary: 129 mg/dL — ABNORMAL HIGH (ref 70–99)
Glucose-Capillary: 110 mg/dL — ABNORMAL HIGH (ref 70–99)

## 2023-11-18 LAB — PROCALCITONIN: Procalcitonin: 0.44 ng/mL

## 2023-11-18 LAB — CBC
HCT: 32.9 % — ABNORMAL LOW (ref 36.0–46.0)
Hemoglobin: 9.8 g/dL — ABNORMAL LOW (ref 12.0–15.0)
MCH: 27.5 pg (ref 26.0–34.0)
MCHC: 29.8 g/dL — ABNORMAL LOW (ref 30.0–36.0)
MCV: 92.2 fL (ref 80.0–100.0)
Platelets: 172 10*3/uL (ref 150–400)
RBC: 3.57 MIL/uL — ABNORMAL LOW (ref 3.87–5.11)
RDW: 20.8 % — ABNORMAL HIGH (ref 11.5–15.5)
WBC: 10.1 10*3/uL (ref 4.0–10.5)
nRBC: 0.2 % (ref 0.0–0.2)

## 2023-11-18 LAB — BASIC METABOLIC PANEL
Anion gap: 10 (ref 5–15)
BUN: 13 mg/dL (ref 8–23)
CO2: 28 mmol/L (ref 22–32)
Calcium: 9.6 mg/dL (ref 8.9–10.3)
Chloride: 101 mmol/L (ref 98–111)
Creatinine, Ser: 0.79 mg/dL (ref 0.44–1.00)
GFR, Estimated: >60 mL/min (ref >60)
Glucose, Bld: 128 mg/dL — ABNORMAL HIGH (ref 70–99)
Potassium: 4.5 mmol/L (ref 3.5–5.1)
Sodium: 139 mmol/L (ref 135–145)

## 2023-11-18 LAB — ECHOCARDIOGRAM COMPLETE
Area-P 1/2: 3.73 cm2
Calc EF: 62.3 %
Height: 66 in
S' Lateral: 2.8 cm
Single Plane A2C EF: 63.1 %
Single Plane A4C EF: 64.9 %
Weight: 2938.29 [oz_av]

## 2023-11-18 LAB — PHOSPHORUS: Phosphorus: 2.4 mg/dL — ABNORMAL LOW (ref 2.5–4.6)

## 2023-11-18 LAB — STREP PNEUMONIAE URINARY ANTIGEN: Strep Pneumo Urinary Antigen: NEGATIVE

## 2023-11-18 LAB — MAGNESIUM: Magnesium: 2 mg/dL (ref 1.7–2.4)

## 2023-11-18 MED ORDER — DOXYCYCLINE HYCLATE 100 MG PO TABS
100.0000 mg | ORAL_TABLET | Freq: Two times a day (BID) | ORAL | 0 refills | Status: AC
  Filled 2023-11-18: qty 14, 7d supply, fill #0

## 2023-11-18 MED ORDER — CEFADROXIL 500 MG PO CAPS: 500.0000 mg | ORAL_CAPSULE | Freq: Two times a day (BID) | ORAL | Status: DC

## 2023-11-18 MED ORDER — PREDNISONE 20 MG PO TABS
40.0000 mg | ORAL_TABLET | Freq: Every day | ORAL | 0 refills | Status: AC
  Filled 2023-11-18: qty 14, 7d supply, fill #0

## 2023-11-18 MED ORDER — ONDANSETRON HCL 4 MG PO TABS
4.0000 mg | ORAL_TABLET | Freq: Three times a day (TID) | ORAL | 0 refills | Status: AC | PRN
  Filled 2023-11-18: qty 30, 10d supply, fill #0

## 2023-11-18 MED ORDER — PERFLUTREN LIPID MICROSPHERE
1.0000 mL | INTRAVENOUS | Status: DC | PRN
  Administered 2023-11-18: 1 mL via INTRAVENOUS

## 2023-11-18 MED ORDER — DOXYCYCLINE HYCLATE 100 MG PO TABS: 100.0000 mg | ORAL_TABLET | Freq: Two times a day (BID) | ORAL | Status: DC

## 2023-11-18 MED ORDER — PANTOPRAZOLE SODIUM 40 MG PO TBEC
40.0000 mg | DELAYED_RELEASE_TABLET | Freq: Every day | ORAL | Status: DC
Start: 2023-11-19 — End: 2023-11-18

## 2023-11-18 MED ORDER — CEFADROXIL 500 MG PO CAPS
500.0000 mg | ORAL_CAPSULE | Freq: Two times a day (BID) | ORAL | 0 refills | Status: AC
  Filled 2023-11-18: qty 14, 7d supply, fill #0

## 2023-11-18 MED ORDER — SODIUM CHLORIDE 0.9 % IV SOLN
INTRAVENOUS | Status: DC | PRN
Start: 2023-11-18 — End: 2023-11-18

## 2023-11-18 MED ORDER — PREDNISONE 20 MG PO TABS: 40.0000 mg | ORAL_TABLET | Freq: Every day | ORAL | Status: DC

## 2023-11-18 MED ORDER — IPRATROPIUM-ALBUTEROL 0.5-2.5 (3) MG/3ML IN SOLN
3.0000 mL | Freq: Four times a day (QID) | RESPIRATORY_TRACT | 0 refills | Status: AC
  Filled 2023-11-18: qty 180, 15d supply, fill #0

## 2023-11-18 MED ORDER — CEFADROXIL 500 MG PO CAPS
500.0000 mg | ORAL_CAPSULE | Freq: Two times a day (BID) | ORAL | Status: DC
  Filled 2023-11-18 (×2): qty 1

## 2023-11-18 NOTE — Progress Notes (Signed)
 PROGRESS NOTE    Teresa Roberts  FMW:[id number] DOB: Jul 17, 1961 DOA: 11/17/2023 PCP: Verena Mems, MD  Subjective: Pt seen at bedside. Mother and pt's husband at bedside. Pt verifies that her O2 concentrator at home is broken. Happened immediately prior to her near respiratory arrest. Asked husband to go to [facility] to get pt's O2 concentrator fixed/replaced. He says he will go there now. Also asked him to make sure pt has portable O2 tanks refilled/exchanged.  Pt on chronic 6-8 liters/min. Pt continues to smoke cigarettes. Reviewed/advised pt that she cannot smoke while wearing O2. She risks catching herself on fire if she smoke while wearing O2. Advised her to complete quit smoking. This was readily supported by her husband, mother and family.  Pt has a grand-son that also has RSV. Unclear who was sick first.    Hospital Course: HPI: Teresa Roberts is a 63 year old with PMH pf HTN, HLD, HFpEF, nonobstructive CAD, COPD on 8 L at home, microcytic anemia who presents with respiratory distress from home.   She was found unresponsive at home by her husband around [facility] 01/02.  Concentrator beeping overnight and husband not able to wake her up so called 911. On EMS arrival she was unresponsive and required bag-valve-mask. She became more responsive.  Initially temperature reading at 93 F, however rectal temp within normal limits. In ED, lab work showed WBC 13 with Hgb at 12, lactic acid 3.4, BNP 34, troponin 13, VBG 7.1/ CO2 106/ bicarb 33. EKG with QTC>600. CXR with without focal consolidation or edema.   PCCM consulted with respiratory failure requiring BiPAP.   Significant Events: Admitted 11/17/2023 acute on chronic hypoxic/hypercapnic respiratory failure.VBG with pH 7.1 and CO2 >100.    Significant Labs: WBC 13 with Hgb at 12, lactic acid 3.4, BNP 34, troponin 13, VBG 7.1/ CO2 106/ bicarb 33  EKG with QTC>600   Significant Imaging Studies: CXR No radiographic evidence of acute  cardiopulmonary disease.   Antibiotic Therapy: Anti-infectives (From admission, onward)    Start     Dose/Rate Route Frequency Ordered Stop   11/18/23 0800  cefTRIAXone  (ROCEPHIN ) 2 g in sodium chloride  0.9 % 100 mL IVPB        2 g 200 mL/hr over 30 Minutes Intravenous Every 24 hours 11/17/23 1057     11/17/23 1200  azithromycin (ZITHROMAX) 500 mg in sodium chloride  0.9 % 250 mL IVPB  Status:  Discontinued        500 mg 250 mL/hr over 60 Minutes Intravenous Every 24 hours 11/17/23 1057 11/17/23 1121   11/17/23 1130  doxycycline  (VIBRAMYCIN ) 100 mg in dextrose  5 % 250 mL IVPB        100 mg 125 mL/hr over 120 Minutes Intravenous Every 12 hours 11/17/23 1121     11/17/23 0715  cefTRIAXone  (ROCEPHIN ) 2 g in sodium chloride  0.9 % 100 mL IVPB        2 g 200 mL/hr over 30 Minutes Intravenous  Once 11/17/23 9285 11/17/23 0818       Procedures:   Consultants: PCCM    Assessment and Plan: * Acute and chronic respiratory failure (acute-on-chronic) (HCC) 11-18-2023 assumed care from [facility] on 11-18-2023. Pt back on her baseline O2 of 6-8 liters.  Pt verifies that her O2 concentrator at home is broken. Happened immediately prior to her near respiratory arrest. Asked husband to go to [facility] to get pt's O2 concentrator fixed/replaced. He says he will go there now. Also asked him to  make sure pt has portable O2 tanks refilled/exchanged.    Respiratory syncytial virus (RSV) infection 11-18-2023 procal slightly elevated at 0.44. change IV abx over to duricef and doxycycline .  COPD with acute exacerbation (HCC) 11-18-2023 change IV solumedrol over to po prednisone . Pt states her nebulizer machine is broken. Will ask TOC to get replacement.  Essential hypertension 11-18-2023 stable. May need to change betablocker over to other meds to avoid bronchospasm in this patient.  Chronic diastolic (congestive) heart failure (HCC) 11/18/2023 stable. Euvolemic. Awaiting echo results. May be a  candidate for entresto. This will also help with her HTN.  Tobacco abuse 11-18-2023 Pt continues to smoke cigarettes. Reviewed/advised pt that she cannot smoke while wearing O2. She risks catching herself on fire if she smoke while wearing O2. Advised her to complete quit smoking. This was readily supported by her husband, mother and family.   Chronic respiratory failure with hypoxia, on home O2 therapy (HCC) - on 6-8 liters/min 11-18-2023 chronically on 6-8 liters per min. Discussed with pt, husband and mother that patient should NEVER take off her O2 for prolonged period of time(I.e. > ). Pt should even use supplemental O2 while taking a shower and using the bathroom. Pt states she has oxygen  tubing that can reach the bathroom.   DVT prophylaxis:   SCDs   Code Status: Full Code Family Communication: discussed with pt, husband and pt's mother at bedside Disposition Plan: return home Reason for continuing need for hospitalization: awaiting to see if pt's oxygen  concentrator and be repaired/replaced today. Ok to transfer out of ICU to progressive bed.  Maybe able to go home today if O2 concentrator can be fixed/replaced.  Objective: Vitals:   11/18/23 0808 11/18/23 0900 11/18/23 1110 11/18/23 1250  BP:  (!) 157/79    Pulse:  90    Resp:  (!) 23    Temp: 97.9 F (36.6 C)  98.2 F (36.8 C)   TempSrc: Oral  Oral   SpO2:  93%  95%  Weight:      Height:        Intake/Output Summary (Last 24 hours) at 11/18/2023 1407 Last data filed at 11/18/2023 0700 Gross per 24 hour  Intake 1374.54 ml  Output --  Net 1374.54 ml   Filed Weights   11/17/23 0638 11/18/23 0500  Weight: 83.6 kg 83.3 kg    Examination:  Physical Exam Vitals and nursing note reviewed.  Constitutional:      General: She is not in acute distress.    Appearance: She is obese. She is not toxic-appearing.  HENT:     Head: Normocephalic and atraumatic.     Nose: Nose normal.  Eyes:     General: No scleral  icterus. Cardiovascular:     Rate and Rhythm: Normal rate.  Pulmonary:     Effort: Pulmonary effort is normal.     Breath sounds: Wheezing present.  Abdominal:     General: Bowel sounds are normal.     Palpations: Abdomen is soft.     Tenderness: There is no abdominal tenderness.     Hernia: No hernia is present.  Musculoskeletal:     Right lower leg: No edema.     Left lower leg: No edema.  Skin:    General: Skin is warm and dry.     Capillary Refill: Capillary refill takes less than 2 seconds.  Neurological:     General: No focal deficit present.     Mental Status: She is alert and  oriented to person, place, and time.     Data Reviewed: I have personally reviewed following labs and imaging studies  CBC: Recent Labs  Lab 11/17/23 0650 11/17/23 0742 11/17/23 0911 11/17/23 1106 11/17/23 1300 11/17/23 1933 11/18/23 0339  WBC 13.0*  --   --   --  9.5  --  10.1  HGB 12.4   < > 11.9* 11.6* 10.3* 10.9* 9.8*  HCT 42.9   < > 35.0* 34.0* 35.8* 32.0* 32.9*  MCV 92.9  --   --   --  94.5  --  92.2  PLT 219  --   --   --  165  --  172   < > = values in this interval not displayed.   Basic Metabolic Panel: Recent Labs  Lab 11/17/23 0650 11/17/23 0742 11/17/23 0911 11/17/23 1106 11/17/23 1300 11/17/23 1933 11/18/23 0339  NA 144 144 146* 145  --  142 139  K 3.4* 3.4* 3.1* 3.2*  --  4.2 4.5  CL 99  --   --   --   --   --  101  CO2 32  --   --   --   --   --  28  GLUCOSE 170*  --   --   --   --   --  128*  BUN 18  --   --   --   --   --  13  CREATININE 1.82*  --   --   --  1.37*  --  0.79  CALCIUM  10.1  --   --   --   --   --  9.6  MG  --   --   --   --   --   --  2.0  PHOS  --   --   --   --   --   --  2.4*   GFR: Estimated Creatinine Clearance: 80.3 mL/min (by C-G formula based on SCr of 0.79 mg/dL). Liver Function Tests: Recent Labs  Lab 11/17/23 0650  AST 22  ALT 15  ALKPHOS 95  BILITOT 0.3  PROT 7.1  ALBUMIN 3.5   BNP (last 3 results) Recent Labs     11/17/23 0650  BNP 34.0   HbA1C: Recent Labs    11/17/23 1300  HGBA1C 5.1   CBG: Recent Labs  Lab 11/17/23 1956 11/17/23 2321 11/18/23 0358 11/18/23 0810 11/18/23 1112  GLUCAP 93 117* 149* 129* 110*   Sepsis Labs: Recent Labs  Lab 11/17/23 0743 11/17/23 1004 11/17/23 1501 11/17/23 1727 11/18/23 0339  PROCALCITON  --   --   --   --  0.44  LATICACIDVEN 3.4* 3.6* 5.8* 3.5*  --     Recent Results (from the past 240 hours)  Culture, blood (Routine X 2) w Reflex to ID Panel     Status: None (Preliminary result)   Collection Time: 11/17/23  7:14 AM   Specimen: BLOOD  Result Value Ref Range Status   Specimen Description BLOOD RIGHT ANTECUBITAL  Final   Special Requests   Final    BOTTLES DRAWN AEROBIC AND ANAEROBIC Blood Culture results may not be optimal due to an inadequate volume of blood received in culture bottles   Culture   Final    NO GROWTH < 24 HOURS Performed at [facility], [address], [place], [place] [address]    Report Status PENDING  Incomplete  Resp panel by RT-PCR (RSV, Flu A&B, Covid) Anterior  Nasal Swab     Status: Abnormal   Collection Time: 11/17/23  7:19 AM   Specimen: Anterior Nasal Swab  Result Value Ref Range Status   SARS Coronavirus 2 by RT PCR NEGATIVE NEGATIVE Final   Influenza A by PCR NEGATIVE NEGATIVE Final   Influenza B by PCR NEGATIVE NEGATIVE Final    Comment: (NOTE) The Xpert Xpress SARS-CoV-2/FLU/RSV plus assay is intended as an aid in the diagnosis of influenza from Nasopharyngeal swab specimens and should not be used as a sole basis for treatment. Nasal washings and aspirates are unacceptable for Xpert Xpress SARS-CoV-2/FLU/RSV testing.  Fact Sheet for Patients: bloggercourse.com  Fact Sheet for Healthcare Providers: seriousbroker.it  This test is not yet approved or cleared by the [place]  FDA and has been authorized for detection and/or diagnosis of  SARS-CoV-2 by FDA under an Emergency Use Authorization (EUA). This EUA will remain in effect (meaning this test can be used) for the duration of the COVID-19 declaration under Section 564(b)(1) of the Act, 21 U.S.C. section 360bbb-3(b)(1), unless the authorization is terminated or revoked.     Resp Syncytial Virus by PCR POSITIVE (A) NEGATIVE Final    Comment: (NOTE) Fact Sheet for Patients: bloggercourse.com  Fact Sheet for Healthcare Providers: seriousbroker.it  This test is not yet approved or cleared by the [place]  FDA and has been authorized for detection and/or diagnosis of SARS-CoV-2 by FDA under an Emergency Use Authorization (EUA). This EUA will remain in effect (meaning this test can be used) for the duration of the COVID-19 declaration under Section 564(b)(1) of the Act, 21 U.S.C. section 360bbb-3(b)(1), unless the authorization is terminated or revoked.  Performed at [facility], [address], [place], [place] [address]   Respiratory (~20 pathogens) panel by PCR     Status: Abnormal   Collection Time: 11/17/23 10:47 AM   Specimen: Nasopharyngeal Swab; Respiratory  Result Value Ref Range Status   Adenovirus NOT DETECTED NOT DETECTED Final   Coronavirus 229E NOT DETECTED NOT DETECTED Final    Comment: (NOTE) The Coronavirus on the Respiratory Panel, DOES NOT test for the novel  Coronavirus (2019 nCoV)    Coronavirus HKU1 NOT DETECTED NOT DETECTED Final   Coronavirus NL63 NOT DETECTED NOT DETECTED Final   Coronavirus OC43 NOT DETECTED NOT DETECTED Final   Metapneumovirus NOT DETECTED NOT DETECTED Final   Rhinovirus / Enterovirus NOT DETECTED NOT DETECTED Final   Influenza A NOT DETECTED NOT DETECTED Final   Influenza B NOT DETECTED NOT DETECTED Final   Parainfluenza Virus 1 NOT DETECTED NOT DETECTED Final   Parainfluenza Virus 2 NOT DETECTED NOT DETECTED Final   Parainfluenza Virus 3 NOT DETECTED  NOT DETECTED Final   Parainfluenza Virus 4 NOT DETECTED NOT DETECTED Final   Respiratory Syncytial Virus DETECTED (A) NOT DETECTED Final   Bordetella pertussis NOT DETECTED NOT DETECTED Final   Bordetella Parapertussis NOT DETECTED NOT DETECTED Final   Chlamydophila pneumoniae NOT DETECTED NOT DETECTED Final   Mycoplasma pneumoniae NOT DETECTED NOT DETECTED Final    Comment: Performed at [facility], [address] [place], [place] [address]  MRSA Next Gen by PCR, Nasal     Status: None   Collection Time: 11/17/23  2:37 PM   Specimen: Nasal Mucosa; Nasal Swab  Result Value Ref Range Status   MRSA by PCR Next Gen NOT DETECTED NOT DETECTED Final    Comment: (NOTE) The GeneXpert MRSA Assay (FDA approved for NASAL specimens only), is one  component of a comprehensive MRSA colonization surveillance program. It is not intended to diagnose MRSA infection nor to guide or monitor treatment for MRSA infections. Test performance is not FDA approved in patients less than 30 years old. Performed at [facility], [address] [place], [place] [address]   Culture, blood (Routine X 2) w Reflex to ID Panel     Status: None (Preliminary result)   Collection Time: 11/17/23  3:01 PM   Specimen: BLOOD  Result Value Ref Range Status   Specimen Description BLOOD SITE NOT SPECIFIED  Final   Special Requests   Final    BOTTLES DRAWN AEROBIC AND ANAEROBIC Blood Culture results may not be optimal due to an inadequate volume of blood received in culture bottles   Culture   Final    NO GROWTH < 24 HOURS Performed at [facility], [address], [place], [place] [address]    Report Status PENDING  Incomplete     Radiology Studies: DG Chest Port 1 View Result Date: 11/17/2023 CLINICAL DATA:  63 year old female with history of shortness of breath. EXAM: PORTABLE CHEST 1 VIEW COMPARISON:  Chest x-ray 02/08/2022. FINDINGS: Image is slightly under penetrated. Lung volumes are normal. No  consolidative airspace disease. No pleural effusions. No pneumothorax. No pulmonary nodule or mass noted. Pulmonary vasculature and the cardiomediastinal silhouette are within normal limits. Atherosclerosis in the thoracic aorta. IMPRESSION: 1.  No radiographic evidence of acute cardiopulmonary disease. 2. Aortic atherosclerosis. Electronically Signed   By: Toribio Aye M.D.   On: 11/17/2023 07:11    Scheduled Meds:  ARIPiprazole   5 mg Oral Daily   busPIRone   10 mg Oral TID   [START ON 11/19/2023] cefadroxil   500 mg Oral BID   Chlorhexidine  Gluconate Cloth  6 each Topical Daily   doxycycline   100 mg Oral Q12H   DULoxetine   60 mg Oral Daily   insulin  aspart  0-20 Units Subcutaneous Q4H   mouth rinse  15 mL Mouth Rinse 4 times per day   [START ON 11/19/2023] pantoprazole   40 mg Oral Daily   [START ON 11/19/2023] predniSONE   40 mg Oral Q breakfast   Continuous Infusions:   LOS: 1 day   Time spent: 50 minutes  Camellia Door, DO  [facility]  11/18/2023, 2:07 PM

## 2023-11-18 NOTE — Subjective & Objective (Signed)
 Pt seen at bedside. Mother and pt's husband at bedside. Pt verifies that her O2 concentrator at home is broken. Happened immediately prior to her near respiratory arrest. Asked husband to go to [facility] to get pt's O2 concentrator fixed/replaced. He says he will go there now. Also asked him to make sure pt has portable O2 tanks refilled/exchanged.  Pt on chronic 6-8 liters/min. Pt continues to smoke cigarettes. Reviewed/advised pt that she cannot smoke while wearing O2. She risks catching herself on fire if she smoke while wearing O2. Advised her to complete quit smoking. This was readily supported by her husband, mother and family.  Pt has a grand-son that also has RSV. Unclear who was sick first.

## 2023-11-18 NOTE — TOC Transition Note (Addendum)
 Transition of Care [facility]) - Discharge Note   Patient Details  Name: Deshonda Cryderman MRN: [id number] Date of Birth: 1961-03-20  Transition of Care [facility]) CM/SW Contact:  Waddell Barnie Rama, RN Phone Number: 11/18/2023, 3:56 PM   Clinical Narrative:    For dc today, family member will transport home, [employer] to bring face mask for neb machine to room prior to dc PCP follow up on AVS.    Final next level of care: Home w Hospice Care Barriers to Discharge: No Barriers Identified   Patient Goals and CMS Choice Patient states their goals for this hospitalization and ongoing recovery are:: return home   Choice offered to / list presented to : NA      Discharge Placement                       Discharge Plan and Services Additional resources added to the After Visit Summary for     Discharge Planning Services: CM Consult Post Acute Care Choice: Durable Medical Equipment          DME Arranged:  (nebulizer mask kit) DME Agency: [facility] Date DME Agency Contacted: 11/18/23 Time DME Agency Contacted: [phone] Representative spoke with at DME Agency: Darlyn HH Arranged: NA          Social Drivers of Health (SDOH) Interventions SDOH Screenings   Depression (PHQ2-9): High Risk (10/04/2023)  Tobacco Use: High Risk (11/17/2023)     Readmission Risk Interventions     No data to display

## 2023-11-18 NOTE — Assessment & Plan Note (Signed)
 11-18-2023 assumed care from [facility] on 11-18-2023. Pt back on her baseline O2 of 6-8 liters.  Pt verifies that her O2 concentrator at home is broken. Happened immediately prior to her near respiratory arrest. Asked husband to go to [facility] to get pt's O2 concentrator fixed/replaced. He says he will go there now. Also asked him to make sure pt has portable O2 tanks refilled/exchanged.

## 2023-11-18 NOTE — Assessment & Plan Note (Addendum)
 11-18-2023 stable. May need to change betablocker over to other meds to avoid bronchospasm in this patient.

## 2023-11-18 NOTE — Progress Notes (Signed)
 Pt is currently off Bipap and is tolerating 8lpm HFNC well at this time.

## 2023-11-18 NOTE — Assessment & Plan Note (Signed)
 11-18-2023 chronically on 6-8 liters per min. Discussed with pt, husband and mother that patient should NEVER take off her O2 for prolonged period of time(I.e. > ). Pt should even use supplemental O2 while taking a shower and using the bathroom. Pt states she has oxygen  tubing that can reach the bathroom.

## 2023-11-18 NOTE — TOC Initial Note (Addendum)
 Transition of Care [facility]) - Initial/Assessment Note    Patient Details  Name: Teresa Roberts MRN: [id number] Date of Birth: 15-Aug-1961  Transition of Care [facility]) CM/SW Contact:    Waddell Barnie Rama, RN Phone Number: 11/18/2023, 3:54 PM  Clinical Narrative:                 From home with spouse, has PCP Gennie Duck)  and insurance on file, states has no HH services in place at this time, has home oxygen  thru [employer] 8 liters, spouse has had oxygen  replaced per patient and it is at the home, she also has a neb machine and cpap.  States family member will transport them home at [employer] and family is support system, states [facility] [address]  Pta self ambulatory. Her cell is [phone].    Expected Discharge Plan: Home/Self Care Barriers to Discharge: No Barriers Identified   Patient Goals and CMS Choice Patient states their goals for this hospitalization and ongoing recovery are:: return home   Choice offered to / list presented to : NA      Expected Discharge Plan and Services   Discharge Planning Services: CM Consult Post Acute Care Choice: Durable Medical Equipment Living arrangements for the past 2 months: Single Family Home Expected Discharge Date: 11/18/23               DME Arranged:  (nebulizer mask kit) DME Agency: [employer] Date DME Agency Contacted: 11/18/23 Time DME Agency Contacted: [phone] Representative spoke with at DME Agency: Darlyn HH Arranged: NA          Prior Living Arrangements/Services Living arrangements for the past 2 months: Single Family Home Lives with:: Spouse Patient language and need for interpreter reviewed:: Yes Do you feel safe going back to the place where you live?: Yes      Need for Family Participation in Patient Care: Yes (Comment) Care giver support system in place?: Yes (comment) Current home services: DME (home oxygen , neb machine and cpap machine) Criminal Activity/Legal Involvement Pertinent to Current  Situation/Hospitalization: No - Comment as needed  Activities of Daily Living   ADL Screening (condition at time of admission) Independently performs ADLs?: Yes (appropriate for developmental age) Is the patient deaf or have difficulty hearing?: No Does the patient have difficulty seeing, even when wearing glasses/contacts?: No Does the patient have difficulty concentrating, remembering, or making decisions?: No  Permission Sought/Granted Permission sought to share information with : Case Manager                Emotional Assessment   Attitude/Demeanor/Rapport: Engaged Affect (typically observed): Appropriate Orientation: : Oriented to Self, Oriented to Place, Oriented to  Time, Oriented to Situation   Psych Involvement: No (comment)  Admission diagnosis:  Acute and chronic respiratory failure (acute-on-chronic) (HCC) [J96.20] COPD exacerbation (HCC) [J44.1] Acute respiratory failure with hypercapnia (HCC) [J96.02] Sepsis with acute renal failure without septic shock, due to unspecified organism, unspecified acute renal failure type (HCC) [A41.9, R65.20, N17.9] Patient Active Problem List   Diagnosis Date Noted   Respiratory syncytial virus (RSV) infection 11/18/2023   Acute and chronic respiratory failure (acute-on-chronic) (HCC) 11/17/2023   B12 deficiency 09/13/2023   Sciatica of left side associated with disorder of lumbar spine 09/13/2022   Chronic pain syndrome 09/13/2022   History of colonic polyps 01/01/2021   Esophageal reflux 01/01/2021   Hypercalcemia 09/23/2020   Hyperparathyroidism, primary (HCC) 08/25/2020   Iron  deficiency anemia 06/27/2019   Coronary  artery disease involving native coronary artery of native heart without angina pectoris 09/05/2018   Pain in joint of left elbow 09/04/2018   HLD (hyperlipidemia) 05/27/2018   Essential hypertension 05/27/2018   Depression 05/27/2018   OSA (obstructive sleep apnea) 05/03/2018   Pulmonary hypertension (HCC)  05/03/2018   Microcytic anemia 10/19/2017   Chronic diastolic (congestive) heart failure (HCC) 08/09/2017   Other fatigue 06/13/2017   Primary insomnia 06/13/2017   History of migraine 06/13/2017   COPD with acute exacerbation (HCC) 02/15/2017   Tobacco abuse 02/15/2017   Chronic respiratory failure with hypoxia, on home O2 therapy (HCC) - on 6-8 liters/min 01/28/2017   Migraine 01/28/2017   Daily headache    MGUS (monoclonal gammopathy of unknown significance) 01/11/2012   Fibromyalgia 01/11/2012   Bruises easily 01/11/2012   PCP:  Verena Mems, MD Pharmacy:   [facility] [address]  - [address] [place] [address] Phone: [phone] Fax: [phone]  [name] [address] [place] [address] Phone: [phone] Fax: [phone]     Social Drivers of Health (SDOH) Social History: SDOH Screenings   Depression (PHQ2-9): High Risk (10/04/2023)  Tobacco Use: High Risk (11/17/2023)   SDOH Interventions:     Readmission Risk Interventions     No data to display
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
## 2023-11-18 NOTE — Progress Notes (Signed)
 Patient noted to have bright red blood from her external hemorrhoid after wiping. After bleeding stopped, we placed a sanitary pad in mesh underwear to monitor for any further bleeding. Patient states that she is being treated for this outpatient. Pharmacy notified of bleeding on heparin . Heparin  was discontinued. Dr. Laurence notified.

## 2023-11-18 NOTE — Assessment & Plan Note (Signed)
 11-18-2023 change IV solumedrol over to po prednisone. Pt states her nebulizer machine is broken. Will ask TOC to get replacement.

## 2023-11-18 NOTE — Assessment & Plan Note (Addendum)
 11-18-2023 procal slightly elevated at 0.44. change IV abx over to duricef and doxycycline.

## 2023-11-18 NOTE — Assessment & Plan Note (Addendum)
 11/18/2023 stable. Euvolemic. Awaiting echo results. May be a candidate for entresto. This will also help with her HTN.

## 2023-11-18 NOTE — Hospital Course (Signed)
 HPI: Teresa Roberts is a 63 year old with PMH pf HTN, HLD, HFpEF, nonobstructive CAD, COPD on 8 L at home, microcytic anemia who presents with respiratory distress from home.   She was found unresponsive at home by her husband around [facility] 01/02.  Concentrator beeping overnight and husband not able to wake her up so called 911. On EMS arrival she was unresponsive and required bag-valve-mask. She became more responsive.  Initially temperature reading at 93 F, however rectal temp within normal limits. In ED, lab work showed WBC 13 with Hgb at 12, lactic acid 3.4, BNP 34, troponin 13, VBG 7.1/ CO2 106/ bicarb 33. EKG with QTC>600. CXR with without focal consolidation or edema.   PCCM consulted with respiratory failure requiring BiPAP.   Significant Events: Admitted 11/17/2023 acute on chronic hypoxic/hypercapnic respiratory failure.VBG with pH 7.1 and CO2 >100.    Significant Labs: WBC 13 with Hgb at 12, lactic acid 3.4, BNP 34, troponin 13, VBG 7.1/ CO2 106/ bicarb 33  EKG with QTC>600   Significant Imaging Studies: CXR No radiographic evidence of acute cardiopulmonary disease.   Antibiotic Therapy: Anti-infectives (From admission, onward)    Start     Dose/Rate Route Frequency Ordered Stop   11/18/23 0800  cefTRIAXone  (ROCEPHIN ) 2 g in sodium chloride  0.9 % 100 mL IVPB        2 g 200 mL/hr over 30 Minutes Intravenous Every 24 hours 11/17/23 1057     11/17/23 1200  azithromycin (ZITHROMAX) 500 mg in sodium chloride  0.9 % 250 mL IVPB  Status:  Discontinued        500 mg 250 mL/hr over 60 Minutes Intravenous Every 24 hours 11/17/23 1057 11/17/23 1121   11/17/23 1130  doxycycline  (VIBRAMYCIN ) 100 mg in dextrose  5 % 250 mL IVPB        100 mg 125 mL/hr over 120 Minutes Intravenous Every 12 hours 11/17/23 1121     11/17/23 0715  cefTRIAXone  (ROCEPHIN ) 2 g in sodium chloride  0.9 % 100 mL IVPB        2 g 200 mL/hr over 30 Minutes Intravenous  Once 11/17/23 9285 11/17/23 0818        Procedures:   Consultants: PCCM

## 2023-11-18 NOTE — Discharge Summary (Signed)
 [facility] Discharge Summary   Patient name: Teresa Roberts  Admit date:     11/17/2023  Discharge date: 11/18/2023  Attending Physician: GERONIMO AMEL [phone]  Discharge Physician: Camellia Door   PCP: Verena Mems, MD  Admitted From: Home  Disposition:  Home  Recommendations for Outpatient Follow-up:  Follow up with PCP in 1-2 weeks Follow up with [facility] in 2 weeks Please follow up on the following pending results: inpatient echo 11-18-2023  Home Health:No Equipment/Devices: Oxygen  6-8 L/min: chronically.  Discharge Condition:Stable CODE STATUS:FULL Diet recommendation: Regular Fluid Restriction: None  Hospital Summary: HPI: Teresa Roberts is a 63 year old with PMH pf HTN, HLD, HFpEF, nonobstructive CAD, COPD on 8 L at home, microcytic anemia who presents with respiratory distress from home.   She was found unresponsive at home by her husband around [facility] 01/02.  Concentrator beeping overnight and husband not able to wake her up so called 911. On EMS arrival she was unresponsive and required bag-valve-mask. She became more responsive.  Initially temperature reading at 93 F, however rectal temp within normal limits. In ED, lab work showed WBC 13 with Hgb at 12, lactic acid 3.4, BNP 34, troponin 13, VBG 7.1/ CO2 106/ bicarb 33. EKG with QTC>600. CXR with without focal consolidation or edema.   PCCM consulted with respiratory failure requiring BiPAP.   Significant Events: Admitted 11/17/2023 acute on chronic hypoxic/hypercapnic respiratory failure.VBG with pH 7.1 and CO2 >100.    Significant Labs: WBC 13 with Hgb at 12, lactic acid 3.4, BNP 34, troponin 13, VBG 7.1/ CO2 106/ bicarb 33  EKG with QTC>600   Significant Imaging Studies: CXR No radiographic evidence of acute cardiopulmonary disease.   Antibiotic Therapy: Anti-infectives (From admission, onward)    Start     Dose/Rate Route Frequency Ordered Stop   11/18/23 0800  cefTRIAXone  (ROCEPHIN ) 2 g  in sodium chloride  0.9 % 100 mL IVPB        2 g 200 mL/hr over 30 Minutes Intravenous Every 24 hours 11/17/23 1057     11/17/23 1200  azithromycin (ZITHROMAX) 500 mg in sodium chloride  0.9 % 250 mL IVPB  Status:  Discontinued        500 mg 250 mL/hr over 60 Minutes Intravenous Every 24 hours 11/17/23 1057 11/17/23 1121   11/17/23 1130  doxycycline  (VIBRAMYCIN ) 100 mg in dextrose  5 % 250 mL IVPB        100 mg 125 mL/hr over 120 Minutes Intravenous Every 12 hours 11/17/23 1121     11/17/23 0715  cefTRIAXone  (ROCEPHIN ) 2 g in sodium chloride  0.9 % 100 mL IVPB        2 g 200 mL/hr over 30 Minutes Intravenous  Once 11/17/23 9285 11/17/23 0818       Procedures:   Consultants: [facility] Course by Problem: * Acute and chronic respiratory failure (acute-on-chronic) (HCC) 11-18-2023 assumed care from [facility] on 11-18-2023. Pt back on her baseline O2 of 6-8 liters.  Pt verifies that her O2 concentrator at home is broken. Happened immediately prior to her near respiratory arrest. Asked husband to go to [facility] to get pt's O2 concentrator fixed/replaced. He says he will go there now. Also asked him to make sure pt has portable O2 tanks refilled/exchanged.    Respiratory syncytial virus (RSV) infection 11-18-2023 procal slightly elevated at 0.44. change IV abx over to duricef and doxycycline .  COPD with acute exacerbation (HCC) 11-18-2023 change IV solumedrol over to po prednisone . Pt  states her nebulizer machine is broken. Will ask TOC to get replacement.  Essential hypertension 11-18-2023 stable. May need to change betablocker over to other meds to avoid bronchospasm in this patient.  Chronic diastolic (congestive) heart failure (HCC) 11/18/2023 stable. Euvolemic. Awaiting echo results. May be a candidate for entresto. This will also help with her HTN.  Tobacco abuse 11-18-2023 Pt continues to smoke cigarettes. Reviewed/advised pt that she cannot smoke while wearing O2. She  risks catching herself on fire if she smoke while wearing O2. Advised her to complete quit smoking. This was readily supported by her husband, mother and family.   Chronic respiratory failure with hypoxia, on home O2 therapy (HCC) - on 6-8 liters/min 11-18-2023 chronically on 6-8 liters per min. Discussed with pt, husband and mother that patient should NEVER take off her O2 for prolonged period of time(I.e. > ). Pt should even use supplemental O2 while taking a shower and using the bathroom. Pt states she has oxygen  tubing that can reach the bathroom.    Discharge Diagnoses:  Principal Problem:   Acute and chronic respiratory failure (acute-on-chronic) (HCC) Active Problems:   COPD with acute exacerbation (HCC)   Respiratory syncytial virus (RSV) infection   Chronic respiratory failure with hypoxia, on home O2 therapy (HCC) - on 6-8 liters/min   Tobacco abuse   Chronic diastolic (congestive) heart failure (HCC)   Essential hypertension   Discharge Instructions  Discharge Instructions     (HEART FAILURE PATIENTS) Call MD:  Anytime you have any of the following symptoms: 1) 3 pound weight gain in 24 hours or 5 pounds in 1 week 2) shortness of breath, with or without a dry hacking cough 3) swelling in the hands, feet or stomach 4) if you have to sleep on extra pillows at night in order to breathe.   Complete by: As directed    Call MD for:  difficulty breathing, headache or visual disturbances   Complete by: As directed    Call MD for:  extreme fatigue   Complete by: As directed    Call MD for:  hives   Complete by: As directed    Call MD for:  persistant dizziness or light-headedness   Complete by: As directed    Call MD for:  persistant nausea and vomiting   Complete by: As directed    Call MD for:  redness, tenderness, or signs of infection (pain, swelling, redness, odor or green/yellow discharge around incision site)   Complete by: As directed    Call MD for:  severe  uncontrolled pain   Complete by: As directed    Call MD for:  temperature >100.4   Complete by: As directed    Diet - low sodium heart healthy   Complete by: As directed    Discharge instructions   Complete by: As directed    1. Follow up with your primary care provider in 1 week. 2. Follow up with [facility] in 2 weeks 3. Quit Smoking. 4. NEVER smoke when wearing supplemental oxygen  5. NEVER take your oxygen  off for more than 1 min. You need to wear your supplemental O2 even when taking a shower or using the bathroom.   Increase activity slowly   Complete by: As directed       Allergies as of 11/18/2023       Reactions   Citrus Hives   Lisinopril Cough        Medication List     TAKE these medications  albuterol  108 (90 Base) MCG/ACT inhaler Commonly known as: VENTOLIN  HFA Inhale 2 puffs into the lungs every 6 (six) hours as needed for wheezing or shortness of breath. What changed: Another medication with the same name was removed. Continue taking this medication, and follow the directions you see here.   alendronate  70 MG tablet Commonly known as: FOSAMAX  Take 1 tablet (70 mg total) by mouth once a week. Take with a full glass of water on an empty stomach.   aspirin  EC 81 MG tablet Take 81 mg by mouth daily.   busPIRone  10 MG tablet Commonly known as: BUSPAR  Take 1 tablet (10 mg total) by mouth 3 (three) times daily. What changed: when to take this   cefadroxil  500 MG capsule Commonly known as: DURICEF Take 1 capsule (500 mg total) by mouth 2 (two) times daily for 7 days. Start taking on: November 19, 2023   dorzolamide-timolol 2-0.5 % ophthalmic solution Commonly known as: COSOPT Place 2 drops into the right eye daily.   doxycycline  100 MG tablet Commonly known as: VIBRA -TABS Take 1 tablet (100 mg total) by mouth every 12 (twelve) hours for 7 days.   DULoxetine  30 MG capsule Commonly known as: CYMBALTA  Take 30 mg by mouth daily. What changed:  Another medication with the same name was removed. Continue taking this medication, and follow the directions you see here.   esomeprazole 40 MG capsule Commonly known as: NEXIUM Take 40 mg by mouth as needed (heartburn).   furosemide  40 MG tablet Commonly known as: Lasix  Take 1 tablet (40 mg total) by mouth daily.   gabapentin  800 MG tablet Commonly known as: NEURONTIN  Take 800 mg by mouth 2 (two) times daily.   ipratropium-albuterol  0.5-2.5 (3) MG/3ML Soln Commonly known as: DUONEB Take 3 mLs by nebulization in the morning, at noon, in the evening, and at bedtime.   metoprolol  succinate 25 MG 24 hr tablet Commonly known as: Toprol  XL Take 1 tablet (25 mg total) by mouth daily.   ondansetron  4 MG tablet Commonly known as: Zofran  Take 1 tablet (4 mg total) by mouth every 8 (eight) hours as needed for nausea or vomiting.   OXYGEN  Inhale 8 L into the lungs continuous.   predniSONE  20 MG tablet Commonly known as: DELTASONE  Take 2 tablets (40 mg total) by mouth daily with breakfast for 7 days. Start taking on: November 19, 2023   pregabalin  75 MG capsule Commonly known as: Lyrica  Take 1 capsule (75 mg total) by mouth 2 (two) times daily. What changed: when to take this   rizatriptan 10 MG disintegrating tablet Commonly known as: MAXALT-MLT Take 10 mg by mouth as needed for migraine. May repeat in 2 hours if needed   simvastatin  10 MG tablet Commonly known as: ZOCOR  Take 10 mg by mouth at bedtime.   tiZANidine  4 MG tablet Commonly known as: ZANAFLEX  TAKE 1 TABLET BY MOUTH EVERY DAY AT BEDTIME AS NEEDED FOR MUSCLE SPASMS What changed: See the new instructions.   topiramate  25 MG tablet Commonly known as: TOPAMAX  Take 25 mg by mouth as needed (migraines).   traZODone  150 MG tablet Commonly known as: DESYREL  Take 1 tablet (150 mg total) by mouth at bedtime.   Trelegy Ellipta  100-62.5-25 MCG/ACT Aepb Generic drug: Fluticasone -Umeclidin-Vilant TAKE 1 PUFF BY MOUTH  EVERY DAY   Vitamin D  (Ergocalciferol ) 1.25 MG (50000 UNIT) Caps capsule Commonly known as: DRISDOL Take 50,000 Units by mouth once a week.  Durable Medical Equipment  (From admission, onward)           Start     Ordered   11/18/23 1404  For home use only DME Nebulizer machine  Once       Question Answer Comment  Patient needs a nebulizer to treat with the following condition Acute on chronic respiratory failure with hypoxia and hypercapnia (HCC)   Patient needs a nebulizer to treat with the following condition COPD (chronic obstructive pulmonary disease) (HCC)   Length of Need Lifetime   Additional equipment included Administration kit   Additional equipment included Filter      11/18/23 1403            Allergies  Allergen Reactions   Citrus Hives   Lisinopril Cough    Discharge Exam: Vitals:   11/18/23 1400 11/18/23 1500  BP: (!) 140/99 (!) 152/85  Pulse: 87 83  Resp: 20 19  Temp:    SpO2: 96% 98%    Physical Exam Vitals and nursing note reviewed.  Constitutional:      General: She is not in acute distress.    Appearance: She is obese. She is not toxic-appearing.  HENT:     Head: Normocephalic and atraumatic.     Nose: Nose normal.  Eyes:     General: No scleral icterus. Cardiovascular:     Rate and Rhythm: Normal rate.  Pulmonary:     Effort: Pulmonary effort is normal. No respiratory distress.     Breath sounds: Wheezing present. No rales.  Abdominal:     General: Bowel sounds are normal.     Palpations: Abdomen is soft.     Tenderness: There is no abdominal tenderness.     Hernia: No hernia is present.  Musculoskeletal:     Right lower leg: No edema.     Left lower leg: No edema.  Skin:    General: Skin is warm and dry.     Capillary Refill: Capillary refill takes less than 2 seconds.  Neurological:     General: No focal deficit present.     Mental Status: She is alert and oriented to person, place, and time.      The results of significant diagnostics from this hospitalization (including imaging, microbiology, ancillary and laboratory) are listed below for reference.    Microbiology: Recent Results (from the past 240 hours)  Culture, blood (Routine X 2) w Reflex to ID Panel     Status: None (Preliminary result)   Collection Time: 11/17/23  7:14 AM   Specimen: BLOOD  Result Value Ref Range Status   Specimen Description BLOOD RIGHT ANTECUBITAL  Final   Special Requests   Final    BOTTLES DRAWN AEROBIC AND ANAEROBIC Blood Culture results may not be optimal due to an inadequate volume of blood received in culture bottles   Culture   Final    NO GROWTH < 24 HOURS Performed at [facility], [address] [place], [place] [address]    Report Status PENDING  Incomplete  Resp panel by RT-PCR (RSV, Flu A&B, Covid) Anterior Nasal Swab     Status: Abnormal   Collection Time: 11/17/23  7:19 AM   Specimen: Anterior Nasal Swab  Result Value Ref Range Status   SARS Coronavirus 2 by RT PCR NEGATIVE NEGATIVE Final   Influenza A by PCR NEGATIVE NEGATIVE Final   Influenza B by PCR NEGATIVE NEGATIVE Final    Comment: (NOTE) The Xpert Xpress SARS-CoV-2/FLU/RSV plus assay  is intended as an aid in the diagnosis of influenza from Nasopharyngeal swab specimens and should not be used as a sole basis for treatment. Nasal washings and aspirates are unacceptable for Xpert Xpress SARS-CoV-2/FLU/RSV testing.  Fact Sheet for Patients: bloggercourse.com  Fact Sheet for Healthcare Providers: seriousbroker.it  This test is not yet approved or cleared by the [place]  FDA and has been authorized for detection and/or diagnosis of SARS-CoV-2 by FDA under an Emergency Use Authorization (EUA). This EUA will remain in effect (meaning this test can be used) for the duration of the COVID-19 declaration under Section 564(b)(1) of the Act, 21 U.S.C. section  360bbb-3(b)(1), unless the authorization is terminated or revoked.     Resp Syncytial Virus by PCR POSITIVE (A) NEGATIVE Final    Comment: (NOTE) Fact Sheet for Patients: bloggercourse.com  Fact Sheet for Healthcare Providers: seriousbroker.it  This test is not yet approved or cleared by the [place]  FDA and has been authorized for detection and/or diagnosis of SARS-CoV-2 by FDA under an Emergency Use Authorization (EUA). This EUA will remain in effect (meaning this test can be used) for the duration of the COVID-19 declaration under Section 564(b)(1) of the Act, 21 U.S.C. section 360bbb-3(b)(1), unless the authorization is terminated or revoked.  Performed at [facility], [address] [place], [place] [address]   Respiratory (~20 pathogens) panel by PCR     Status: Abnormal   Collection Time: 11/17/23 10:47 AM   Specimen: Nasopharyngeal Swab; Respiratory  Result Value Ref Range Status   Adenovirus NOT DETECTED NOT DETECTED Final   Coronavirus 229E NOT DETECTED NOT DETECTED Final    Comment: (NOTE) The Coronavirus on the Respiratory Panel, DOES NOT test for the novel  Coronavirus (2019 nCoV)    Coronavirus HKU1 NOT DETECTED NOT DETECTED Final   Coronavirus NL63 NOT DETECTED NOT DETECTED Final   Coronavirus OC43 NOT DETECTED NOT DETECTED Final   Metapneumovirus NOT DETECTED NOT DETECTED Final   Rhinovirus / Enterovirus NOT DETECTED NOT DETECTED Final   Influenza A NOT DETECTED NOT DETECTED Final   Influenza B NOT DETECTED NOT DETECTED Final   Parainfluenza Virus 1 NOT DETECTED NOT DETECTED Final   Parainfluenza Virus 2 NOT DETECTED NOT DETECTED Final   Parainfluenza Virus 3 NOT DETECTED NOT DETECTED Final   Parainfluenza Virus 4 NOT DETECTED NOT DETECTED Final   Respiratory Syncytial Virus DETECTED (A) NOT DETECTED Final   Bordetella pertussis NOT DETECTED NOT DETECTED Final   Bordetella Parapertussis NOT  DETECTED NOT DETECTED Final   Chlamydophila pneumoniae NOT DETECTED NOT DETECTED Final   Mycoplasma pneumoniae NOT DETECTED NOT DETECTED Final    Comment: Performed at [facility], [address] [place], [place] [address]  MRSA Next Gen by PCR, Nasal     Status: None   Collection Time: 11/17/23  2:37 PM   Specimen: Nasal Mucosa; Nasal Swab  Result Value Ref Range Status   MRSA by PCR Next Gen NOT DETECTED NOT DETECTED Final    Comment: (NOTE) The GeneXpert MRSA Assay (FDA approved for NASAL specimens only), is one component of a comprehensive MRSA colonization surveillance program. It is not intended to diagnose MRSA infection nor to guide or monitor treatment for MRSA infections. Test performance is not FDA approved in patients less than 45 years old. Performed at [facility], [address], [place], [place] [address]   Culture, blood (Routine X 2) w Reflex to ID Panel     Status:  None (Preliminary result)   Collection Time: 11/17/23  3:01 PM   Specimen: BLOOD  Result Value Ref Range Status   Specimen Description BLOOD SITE NOT SPECIFIED  Final   Special Requests   Final    BOTTLES DRAWN AEROBIC AND ANAEROBIC Blood Culture results may not be optimal due to an inadequate volume of blood received in culture bottles   Culture   Final    NO GROWTH < 24 HOURS Performed at [facility], [address], [place], [place] [address]    Report Status PENDING  Incomplete     Labs: BNP (last 3 results) Recent Labs    11/17/23 0650  BNP 34.0   Basic Metabolic Panel: Recent Labs  Lab 11/17/23 0650 11/17/23 0742 11/17/23 0911 11/17/23 1106 11/17/23 1300 11/17/23 1933 11/18/23 0339  NA 144 144 146* 145  --  142 139  K 3.4* 3.4* 3.1* 3.2*  --  4.2 4.5  CL 99  --   --   --   --   --  101  CO2 32  --   --   --   --   --  28  GLUCOSE 170*  --   --   --   --   --  128*  BUN 18  --   --   --   --   --  13  CREATININE 1.82*  --   --   --  1.37*  --  0.79   CALCIUM  10.1  --   --   --   --   --  9.6  MG  --   --   --   --   --   --  2.0  PHOS  --   --   --   --   --   --  2.4*   Liver Function Tests: Recent Labs  Lab 11/17/23 0650  AST 22  ALT 15  ALKPHOS 95  BILITOT 0.3  PROT 7.1  ALBUMIN 3.5   CBC: Recent Labs  Lab 11/17/23 0650 11/17/23 0742 11/17/23 0911 11/17/23 1106 11/17/23 1300 11/17/23 1933 11/18/23 0339  WBC 13.0*  --   --   --  9.5  --  10.1  HGB 12.4   < > 11.9* 11.6* 10.3* 10.9* 9.8*  HCT 42.9   < > 35.0* 34.0* 35.8* 32.0* 32.9*  MCV 92.9  --   --   --  94.5  --  92.2  PLT 219  --   --   --  165  --  172   < > = values in this interval not displayed.   BNP: Recent Labs  Lab 11/17/23 0650  BNP 34.0   CBG: Recent Labs  Lab 11/17/23 1956 11/17/23 2321 11/18/23 0358 11/18/23 0810 11/18/23 1112  GLUCAP 93 117* 149* 129* 110*   Hgb A1c Recent Labs    11/17/23 1300  HGBA1C 5.1   Urinalysis    Component Value Date/Time   COLORURINE YELLOW 11/18/2023 1308   APPEARANCEUR HAZY (A) 11/18/2023 1308   LABSPEC 1.017 11/18/2023 1308   PHURINE 5.0 11/18/2023 1308   GLUCOSEU NEGATIVE 11/18/2023 1308   HGBUR NEGATIVE 11/18/2023 1308   BILIRUBINUR NEGATIVE 11/18/2023 1308   KETONESUR 5 (A) 11/18/2023 1308   PROTEINUR 30 (A) 11/18/2023 1308   UROBILINOGEN 1.0 09/12/2014 1952   NITRITE NEGATIVE 11/18/2023 1308   LEUKOCYTESUR NEGATIVE 11/18/2023 1308   Sepsis Labs Recent Labs  Lab 11/17/23 0650 11/17/23 1300  11/18/23 0339  WBC 13.0* 9.5 10.1   Microbiology Recent Results (from the past 240 hours)  Culture, blood (Routine X 2) w Reflex to ID Panel     Status: None (Preliminary result)   Collection Time: 11/17/23  7:14 AM   Specimen: BLOOD  Result Value Ref Range Status   Specimen Description BLOOD RIGHT ANTECUBITAL  Final   Special Requests   Final    BOTTLES DRAWN AEROBIC AND ANAEROBIC Blood Culture results may not be optimal due to an inadequate volume of blood received in culture bottles    Culture   Final    NO GROWTH < 24 HOURS Performed at [facility], [address] [place], [place] [address]    Report Status PENDING  Incomplete  Resp panel by RT-PCR (RSV, Flu A&B, Covid) Anterior Nasal Swab     Status: Abnormal   Collection Time: 11/17/23  7:19 AM   Specimen: Anterior Nasal Swab  Result Value Ref Range Status   SARS Coronavirus 2 by RT PCR NEGATIVE NEGATIVE Final   Influenza A by PCR NEGATIVE NEGATIVE Final   Influenza B by PCR NEGATIVE NEGATIVE Final    Comment: (NOTE) The Xpert Xpress SARS-CoV-2/FLU/RSV plus assay is intended as an aid in the diagnosis of influenza from Nasopharyngeal swab specimens and should not be used as a sole basis for treatment. Nasal washings and aspirates are unacceptable for Xpert Xpress SARS-CoV-2/FLU/RSV testing.  Fact Sheet for Patients: bloggercourse.com  Fact Sheet for Healthcare Providers: seriousbroker.it  This test is not yet approved or cleared by the [place]  FDA and has been authorized for detection and/or diagnosis of SARS-CoV-2 by FDA under an Emergency Use Authorization (EUA). This EUA will remain in effect (meaning this test can be used) for the duration of the COVID-19 declaration under Section 564(b)(1) of the Act, 21 U.S.C. section 360bbb-3(b)(1), unless the authorization is terminated or revoked.     Resp Syncytial Virus by PCR POSITIVE (A) NEGATIVE Final    Comment: (NOTE) Fact Sheet for Patients: bloggercourse.com  Fact Sheet for Healthcare Providers: seriousbroker.it  This test is not yet approved or cleared by the [place]  FDA and has been authorized for detection and/or diagnosis of SARS-CoV-2 by FDA under an Emergency Use Authorization (EUA). This EUA will remain in effect (meaning this test can be used) for the duration of the COVID-19 declaration under Section 564(b)(1) of the Act,  21 U.S.C. section 360bbb-3(b)(1), unless the authorization is terminated or revoked.  Performed at [facility], [address] [place], [place] [address]   Respiratory (~20 pathogens) panel by PCR     Status: Abnormal   Collection Time: 11/17/23 10:47 AM   Specimen: Nasopharyngeal Swab; Respiratory  Result Value Ref Range Status   Adenovirus NOT DETECTED NOT DETECTED Final   Coronavirus 229E NOT DETECTED NOT DETECTED Final    Comment: (NOTE) The Coronavirus on the Respiratory Panel, DOES NOT test for the novel  Coronavirus (2019 nCoV)    Coronavirus HKU1 NOT DETECTED NOT DETECTED Final   Coronavirus NL63 NOT DETECTED NOT DETECTED Final   Coronavirus OC43 NOT DETECTED NOT DETECTED Final   Metapneumovirus NOT DETECTED NOT DETECTED Final   Rhinovirus / Enterovirus NOT DETECTED NOT DETECTED Final   Influenza A NOT DETECTED NOT DETECTED Final   Influenza B NOT DETECTED NOT DETECTED Final   Parainfluenza Virus 1 NOT DETECTED NOT DETECTED Final   Parainfluenza Virus 2 NOT DETECTED NOT DETECTED Final   Parainfluenza Virus 3  NOT DETECTED NOT DETECTED Final   Parainfluenza Virus 4 NOT DETECTED NOT DETECTED Final   Respiratory Syncytial Virus DETECTED (A) NOT DETECTED Final   Bordetella pertussis NOT DETECTED NOT DETECTED Final   Bordetella Parapertussis NOT DETECTED NOT DETECTED Final   Chlamydophila pneumoniae NOT DETECTED NOT DETECTED Final   Mycoplasma pneumoniae NOT DETECTED NOT DETECTED Final    Comment: Performed at [facility], [address] [place], [place] [address]  MRSA Next Gen by PCR, Nasal     Status: None   Collection Time: 11/17/23  2:37 PM   Specimen: Nasal Mucosa; Nasal Swab  Result Value Ref Range Status   MRSA by PCR Next Gen NOT DETECTED NOT DETECTED Final    Comment: (NOTE) The GeneXpert MRSA Assay (FDA approved for NASAL specimens only), is one component of a comprehensive MRSA colonization surveillance program. It is not intended to diagnose  MRSA infection nor to guide or monitor treatment for MRSA infections. Test performance is not FDA approved in patients less than 74 years old. Performed at [facility], [address], [place], [place] [address]   Culture, blood (Routine X 2) w Reflex to ID Panel     Status: None (Preliminary result)   Collection Time: 11/17/23  3:01 PM   Specimen: BLOOD  Result Value Ref Range Status   Specimen Description BLOOD SITE NOT SPECIFIED  Final   Special Requests   Final    BOTTLES DRAWN AEROBIC AND ANAEROBIC Blood Culture results may not be optimal due to an inadequate volume of blood received in culture bottles   Culture   Final    NO GROWTH < 24 HOURS Performed at [facility], [address], [place], [place] [address]    Report Status PENDING  Incomplete    Procedures/Studies: DG Chest Port 1 View Result Date: 11/17/2023 CLINICAL DATA:  63 year old female with history of shortness of breath. EXAM: PORTABLE CHEST 1 VIEW COMPARISON:  Chest x-ray 02/08/2022. FINDINGS: Image is slightly under penetrated. Lung volumes are normal. No consolidative airspace disease. No pleural effusions. No pneumothorax. No pulmonary nodule or mass noted. Pulmonary vasculature and the cardiomediastinal silhouette are within normal limits. Atherosclerosis in the thoracic aorta. IMPRESSION: 1.  No radiographic evidence of acute cardiopulmonary disease. 2. Aortic atherosclerosis. Electronically Signed   By: Toribio Aye M.D.   On: 11/17/2023 07:11    Time coordinating discharge: 50 mins  SIGNED:  Camellia Door, DO [facility] 11/18/23, 3:27 PM

## 2023-11-21 LAB — CULTURE, BLOOD (ROUTINE X 2): Culture  Setup Time: NO GROWTH

## 2023-11-22 LAB — CULTURE, BLOOD (ROUTINE X 2): Culture: NO GROWTH

## 2023-11-24 ENCOUNTER — Encounter: Payer: Self-pay | Admitting: Primary Care

## 2023-11-24 ENCOUNTER — Ambulatory Visit: Payer: Medicare Other | Admitting: Primary Care

## 2023-11-24 VITALS — BP 128/72 | HR 90 | Temp 97.9°F | Ht 66.0 in | Wt 180.2 lb

## 2023-11-24 DIAGNOSIS — F17210 Nicotine dependence, cigarettes, uncomplicated: Secondary | ICD-10-CM | POA: Diagnosis not present

## 2023-11-24 DIAGNOSIS — J441 Chronic obstructive pulmonary disease with (acute) exacerbation: Secondary | ICD-10-CM

## 2023-11-24 DIAGNOSIS — I50813 Acute on chronic right heart failure: Secondary | ICD-10-CM | POA: Diagnosis not present

## 2023-11-24 DIAGNOSIS — G4733 Obstructive sleep apnea (adult) (pediatric): Secondary | ICD-10-CM | POA: Diagnosis not present

## 2023-11-24 DIAGNOSIS — J9621 Acute and chronic respiratory failure with hypoxia: Secondary | ICD-10-CM | POA: Diagnosis not present

## 2023-11-24 DIAGNOSIS — J449 Chronic obstructive pulmonary disease, unspecified: Secondary | ICD-10-CM | POA: Diagnosis not present

## 2023-11-24 DIAGNOSIS — J9611 Chronic respiratory failure with hypoxia: Secondary | ICD-10-CM

## 2023-11-24 DIAGNOSIS — I2723 Pulmonary hypertension due to lung diseases and hypoxia: Secondary | ICD-10-CM | POA: Diagnosis not present

## 2023-11-24 MED ORDER — TRELEGY ELLIPTA 200-62.5-25 MCG/ACT IN AEPB: 200.0000 | INHALATION_SPRAY | Freq: Every day | RESPIRATORY_TRACT | 0 refills | Status: DC

## 2023-11-24 NOTE — Patient Instructions (Addendum)
 -  CHRONIC OBSTRUCTIVE PULMONARY DISEASE (COPD) AND CHRONIC RESPIRATORY FAILURE: COPD is a chronic lung condition that makes it hard to breathe. You are currently managing it with daily Trelegy and oxygen  support. Recently, you had a respiratory infection treated with prednisone  and antibiotics, which has improved your symptoms. We will continue your current medications and add a nebulizer machine and albuterol  for emergency use.  -OBSTRUCTIVE SLEEP APNEA (OSA): OSA is a condition where your breathing stops and starts during sleep. You stopped using CPAP in 2018 due to intolerance and currently report no sleep issues. No changes to your current management are needed. Sleep with oxygen  and focus on side sleeping position or elevate head of bed.   -HEMORRHOID SURGERY: You are scheduled for surgery to remove internal and external hemorrhoids. Due to your COPD and recent respiratory infection, we recommend delaying the surgery for 4 weeks to ensure you are fully recovered. Use your incentive spirometer before and after surgery, and start walking as soon as possible after surgery to prevent complications.  Recommend surgery be done in hospital settings. We will communicate your health status to the surgical team.  INSTRUCTIONS: Take higher dose Trelegy 200mcg daily x4 weeks and complete your course of prednisone  and antibiotics. We will order a nebulizer machine and albuterol  for you to use as needed. Your hemorrhoid surgery should be delayed ideally until sometime in February to allow your respiratory infection to fully resolve. Use your incentive spirometer regularly. We will inform the surgical team about your current health status.  Follow-up 4 weeks with Dr. Alva at [facility] or [place] NP in [place]

## 2023-11-24 NOTE — Progress Notes (Signed)
 @Patient  ID: Teresa Roberts, female    DOB: 1960/11/27, 63 y.o.   MRN: [id number]  No chief complaint on file.   Referring provider: Verena Mems, MD  HPI: 63 year old female, some day smoker. PMH significant COPD, chronic respiratory failure, OSA, chronic diastolic heart failure, pulmonary hypertension, GERD, hyperparathyroidism, monoclonal gammopathy, hyperlipidemia, fibromyalgia, tobacco abuse.  Patient of Dr. Jude.  11/24/2023 Discussed the use of AI scribe software for clinical note transcription with the patient, who gave verbal consent to proceed.  History of Present Illness   The patient, with a history of COPD and sleep apnea, is scheduled for an outpatient surgical procedure for internal and external hemorrhoids. The procedure will be performed by [facility] through [facility]. The patient has no known issues with anesthesia and has never required emergent intubation.  The patient's COPD is managed with Trelegy, taken daily, and she requires 24/7 oxygen  support, with 8 liters at home and 6 liters on her portable device. She reports no recent changes in her baseline oxygen  requirements. The patient was previously on CPAP for sleep apnea but discontinued use around 2018 due to intolerance. She denies any current sleep issues or episodes of waking up gasping or choking.  Recently, the patient experienced a respiratory infection, presenting with a cough. She visited the ER on January 2nd, where she was tested negative for COVID, flu, and RSV. She was prescribed prednisone , Duricef, and doxycycline . The patient reports an improvement in her symptoms, with increased mucus production and relief from chest pain. She denies any distress in breathing. A chest x-ray taken during the ER visit showed no active cardiopulmonary disease.  The patient does not have a nebulizer or a rescue inhaler at home. She receives her oxygen  supply from [employer]. The patient uses an  incentive spirometer and denies any history of blood clots or use of blood thinners.         Allergies  Allergen Reactions   Citrus Hives   Lisinopril Cough    Immunization History  Administered Date(s) Administered   Influenza Split 08/15/2017, 08/28/2018, 08/24/2019   Influenza,inj,Quad PF,6+ Mos 12/28/2016, 09/28/2017, 09/04/2018, 08/03/2019, 08/29/2020, 08/03/2021, 08/11/2022   Influenza,inj,quad, With Preservative 08/09/2014   Influenza-Unspecified 08/15/2016   Moderna Sars-Covid-2 Vaccination 02/11/2020, 03/10/2020, 07/11/2020   Pneumococcal Polysaccharide-23 02/04/2014, 08/31/2016   Pneumococcal-Unspecified 02/13/2013   Respiratory Syncytial Virus Vaccine ,Recomb Aduvanted(Arexvy ) 08/11/2022   Tdap 04/25/2013, 02/04/2014   Zoster, Live 05/31/2019, 08/03/2019    Past Medical History:  Diagnosis Date   CAD (coronary artery disease)    a. 01/2017: cath showing 20% mid RCA stenosis and otherwise normal LAD and LCx with a preserved EF of 55 to 60%; mod pulmonary HTN   Carpal tunnel syndrome    COPD (chronic obstructive pulmonary disease) (HCC)    O2 dependent   cpap    Depression    PTSD   Diastolic CHF (HCC)    Fibromyalgia    Hyperlipidemia    Hypertension    Migraines    Osteoporosis    Sciatic nerve disease    Type 2 myocardial infarction without ST elevation (HCC) 01/26/2017   IMO SNOMED Dx Update Oct 2024      Tobacco History: Social History   Tobacco Use  Smoking Status Some Days   Current packs/day: 0.25   Average packs/day: 0.3 packs/day for 45.0 years (11.3 ttl pk-yrs)   Types: Cigarettes   Start date: 1980   Passive exposure: Never  Smokeless Tobacco Never   Ready to quit:  Not Answered Counseling given: Not Answered   Outpatient Medications Prior to Visit  Medication Sig Dispense Refill   albuterol  (VENTOLIN  HFA) 108 (90 Base) MCG/ACT inhaler Inhale 2 puffs into the lungs every 6 (six) hours as needed for wheezing or shortness of breath.  (Patient not taking: Reported on 11/18/2023) 18 g 5   alendronate  (FOSAMAX ) 70 MG tablet Take 1 tablet (70 mg total) by mouth once a week. Take with a full glass of water on an empty stomach. 12 tablet 0   aspirin  81 MG EC tablet Take 81 mg by mouth daily.  0   busPIRone  (BUSPAR ) 10 MG tablet Take 1 tablet (10 mg total) by mouth 3 (three) times daily. (Patient taking differently: Take 10 mg by mouth 2 (two) times daily.) 90 tablet 1   cefadroxil  (DURICEF) 500 MG capsule Take 1 capsule (500 mg total) by mouth 2 (two) times daily for 7 days. 14 capsule 0   dorzolamide-timolol (COSOPT) 22.3-6.8 MG/ML ophthalmic solution Place 2 drops into the right eye daily.     doxycycline  (VIBRA -TABS) 100 MG tablet Take 1 tablet (100 mg total) by mouth every 12 (twelve) hours for 7 days. 14 tablet 0   DULoxetine  (CYMBALTA ) 30 MG capsule Take 30 mg by mouth daily.     esomeprazole (NEXIUM) 40 MG capsule Take 40 mg by mouth as needed (heartburn).     furosemide  (LASIX ) 40 MG tablet Take 1 tablet (40 mg total) by mouth daily. 90 tablet 3   gabapentin  (NEURONTIN ) 800 MG tablet Take 800 mg by mouth 2 (two) times daily.     ipratropium-albuterol  (DUONEB) 0.5-2.5 (3) MG/3ML SOLN Take 3 mLs by nebulization in the morning, at noon, in the evening, and at bedtime. 360 mL 0   metoprolol  succinate (TOPROL  XL) 25 MG 24 hr tablet Take 1 tablet (25 mg total) by mouth daily. (Patient not taking: Reported on 11/18/2023) 90 tablet 3   ondansetron  (ZOFRAN ) 4 MG tablet Take 1 tablet (4 mg total) by mouth every 8 (eight) hours as needed for nausea or vomiting. 30 tablet 0   OXYGEN  Inhale 8 L into the lungs continuous.     predniSONE  (DELTASONE ) 20 MG tablet Take 2 tablets (40 mg total) by mouth daily with breakfast for 7 days. 14 tablet 0   pregabalin  (LYRICA ) 75 MG capsule Take 1 capsule (75 mg total) by mouth 2 (two) times daily. (Patient taking differently: Take 75 mg by mouth daily.) 60 capsule 6   rizatriptan (MAXALT-MLT) 10 MG  disintegrating tablet Take 10 mg by mouth as needed for migraine. May repeat in 2 hours if needed     simvastatin  (ZOCOR ) 10 MG tablet Take 10 mg by mouth at bedtime.     tiZANidine  (ZANAFLEX ) 4 MG tablet TAKE 1 TABLET BY MOUTH EVERY DAY AT BEDTIME AS NEEDED FOR MUSCLE SPASMS (Patient taking differently: Take 4 mg by mouth every other day.) 30 tablet 2   topiramate  (TOPAMAX ) 25 MG tablet Take 25 mg by mouth as needed (migraines).     traZODone  (DESYREL ) 150 MG tablet Take 1 tablet (150 mg total) by mouth at bedtime. 30 tablet 1   TRELEGY ELLIPTA  100-62.5-25 MCG/ACT AEPB TAKE 1 PUFF BY MOUTH EVERY DAY 60 each 5   Vitamin D , Ergocalciferol , (DRISDOL) 1.25 MG (50000 UNIT) CAPS capsule Take 50,000 Units by mouth once a week.     Facility-Administered Medications Prior to Visit  Medication Dose Route Frequency Provider Last Rate Last Admin   0.9 %  sodium chloride  infusion (Manually program via Guardrails IV Fluids)  250 mL Intravenous Continuous Boscia, Heather E, NP   Stopped at 09/27/23 1334   0.9 %  sodium chloride  infusion (Manually program via Guardrails IV Fluids)  250 mL Intravenous Continuous Boscia, Heather E, NP   Stopped at 09/27/23 1542   sodium chloride  flush (NS) 0.9 % injection 3 mL  3 mL Intracatheter PRN Hanford Powell BRAVO, NP       Review of Systems  Review of Systems  Constitutional: Negative.   HENT: Negative.    Respiratory:  Positive for cough.   Cardiovascular: Negative.     Physical Exam  LMP 08/19/2012  Physical Exam Constitutional:      General: She is not in acute distress.    Appearance: Normal appearance. She is not ill-appearing.  HENT:     Head: Normocephalic and atraumatic.  Cardiovascular:     Rate and Rhythm: Normal rate and regular rhythm.  Pulmonary:     Breath sounds: Wheezing present.     Comments: POC 6L Musculoskeletal:        General: Normal range of motion.  Skin:    General: Skin is warm and dry.  Neurological:     General: No focal  deficit present.     Mental Status: She is alert and oriented to person, place, and time. Mental status is at baseline.  Psychiatric:        Mood and Affect: Mood normal.        Behavior: Behavior normal.        Thought Content: Thought content normal.        Judgment: Judgment normal.      Lab Results:  CBC    Component Value Date/Time   WBC 10.1 11/18/2023 0339   RBC 3.57 (L) 11/18/2023 0339   HGB 9.8 (L) 11/18/2023 0339   HGB 6.6 (LL) 09/26/2023 1318   HGB 12.7 07/12/2018 1436   HGB 13.6 09/30/2017 0954   HCT 32.9 (L) 11/18/2023 0339   HCT 44.0 07/12/2018 1436   HCT 44.8 09/30/2017 0954   PLT 172 11/18/2023 0339   PLT 156 09/26/2023 1318   PLT 206 07/12/2018 1436   MCV 92.2 11/18/2023 0339   MCV 72 (L) 07/12/2018 1436   MCV 76.8 (L) 09/30/2017 0954   MCH 27.5 11/18/2023 0339   MCHC 29.8 (L) 11/18/2023 0339   RDW 20.8 (H) 11/18/2023 0339   RDW 22.2 (H) 07/12/2018 1436   RDW 29.2 (H) 09/30/2017 0954   LYMPHSABS 1.2 09/26/2023 1318   LYMPHSABS 1.3 09/30/2017 0954   MONOABS 0.6 09/26/2023 1318   MONOABS 0.4 09/30/2017 0954   EOSABS 0.2 09/26/2023 1318   EOSABS 0.2 09/30/2017 0954   BASOSABS 0.0 09/26/2023 1318   BASOSABS 0.1 09/30/2017 0954    BMET    Component Value Date/Time   NA 139 11/18/2023 0339   NA 143 07/12/2018 1436   NA 141 09/30/2017 0954   K 4.5 11/18/2023 0339   K 3.7 09/30/2017 0954   CL 101 11/18/2023 0339   CO2 28 11/18/2023 0339   CO2 24 09/30/2017 0954   GLUCOSE 128 (H) 11/18/2023 0339   GLUCOSE 96 09/30/2017 0954   BUN 13 11/18/2023 0339   BUN 17 07/12/2018 1436   BUN 8.8 09/30/2017 0954   CREATININE 0.79 11/18/2023 0339   CREATININE 0.97 09/26/2023 1318   CREATININE 1.04 07/13/2023 1337   CREATININE 0.8 09/30/2017 0954   CALCIUM  9.6 11/18/2023 0339   CALCIUM  10.4  09/30/2017 0954   GFRNONAA >60 11/18/2023 0339   GFRNONAA >60 09/26/2023 1318   GFRAA >60 08/27/2019 1430    BNP    Component Value Date/Time   BNP 34.0  11/17/2023 0650    ProBNP No results found for: PROBNP  Imaging: ECHOCARDIOGRAM COMPLETE Result Date: 11/18/2023    ECHOCARDIOGRAM REPORT   Patient Name:   [facility] Date of Exam: 11/18/2023 Medical Rec #:  [id number]  Height:       66.0 in Accession #:    [id number] Weight:       183.6 lb Date of Birth:  1961-04-16  BSA:          1.929 m Patient Age:    62 years   BP:           150/85 mmHg Patient Gender: F          HR:           81 bpm. Exam Location:  Inpatient Procedure: 2D Echo, Cardiac Doppler, Color Doppler and Intracardiac            Opacification Agent Indications:    Acute respiratory distress R06.03  History:        Patient has prior history of Echocardiogram examinations, most                 recent 03/26/2020. CHF, CAD and Previous Myocardial Infarction,                 COPD; Risk Factors:Hypertension.  Sonographer:    Lanell Maduro Referring Phys: [id number] WHITNEY PLUNKETT IMPRESSIONS  1. Left ventricular ejection fraction, by estimation, is 60 to 65%. The left ventricle has normal function. The left ventricle has no regional wall motion abnormalities. Left ventricular diastolic parameters were normal.  2. Right ventricular systolic function is normal. The right ventricular size is mildly enlarged.  3. The mitral valve is normal in structure. No evidence of mitral valve regurgitation. No evidence of mitral stenosis.  4. The aortic valve is normal in structure. Aortic valve regurgitation is not visualized. No aortic stenosis is present.  5. The inferior vena cava is normal in size with greater than 50% respiratory variability, suggesting right atrial pressure of 3 mmHg. FINDINGS  Left Ventricle: Left ventricular ejection fraction, by estimation, is 60 to 65%. The left ventricle has normal function. The left ventricle has no regional wall motion abnormalities. Definity  contrast agent was given IV to delineate the left ventricular  endocardial borders. The left ventricular internal cavity size was  normal in size. There is no left ventricular hypertrophy. Left ventricular diastolic parameters were normal. Right Ventricle: The right ventricular size is mildly enlarged. No increase in right ventricular wall thickness. Right ventricular systolic function is normal. Left Atrium: Left atrial size was normal in size. Right Atrium: Right atrial size was normal in size. Pericardium: There is no evidence of pericardial effusion. Mitral Valve: The mitral valve is normal in structure. No evidence of mitral valve regurgitation. No evidence of mitral valve stenosis. Tricuspid Valve: The tricuspid valve is normal in structure. Tricuspid valve regurgitation is not demonstrated. No evidence of tricuspid stenosis. Aortic Valve: The aortic valve is normal in structure. Aortic valve regurgitation is not visualized. No aortic stenosis is present. Pulmonic Valve: The pulmonic valve was normal in structure. Pulmonic valve regurgitation is not visualized. No evidence of pulmonic stenosis. Aorta: The aortic root is normal in size and structure. Venous: The inferior vena cava is normal in size with greater than  50% respiratory variability, suggesting right atrial pressure of 3 mmHg. IAS/Shunts: No atrial level shunt detected by color flow Doppler.  LEFT VENTRICLE PLAX 2D LVIDd:         3.50 cm      Diastology LVIDs:         2.80 cm      LV e' medial:    7.72 cm/s LV PW:         0.90 cm      LV E/e' medial:  15.2 LV IVS:        1.00 cm      LV e' lateral:   13.90 cm/s LVOT diam:     2.10 cm      LV E/e' lateral: 8.4 LV SV:         82 LV SV Index:   43 LVOT Area:     3.46 cm  LV Volumes (MOD) LV vol d, MOD A2C: 137.0 ml LV vol d, MOD A4C: 123.0 ml LV vol s, MOD A2C: 50.6 ml LV vol s, MOD A4C: 43.2 ml LV SV MOD A2C:     86.4 ml LV SV MOD A4C:     123.0 ml LV SV MOD BP:      82.6 ml RIGHT VENTRICLE             IVC RV Basal diam:  3.70 cm     IVC diam: 2.00 cm RV S prime:     10.60 cm/s TAPSE (M-mode): 2.5 cm LEFT ATRIUM             Index         RIGHT ATRIUM           Index LA diam:        3.50 cm 1.81 cm/m   RA Area:     11.70 cm LA Vol (A2C):   43.7 ml 22.66 ml/m  RA Volume:   26.80 ml  13.90 ml/m LA Vol (A4C):   20.0 ml 10.37 ml/m LA Biplane Vol: 30.4 ml 15.76 ml/m  AORTIC VALVE LVOT Vmax:   136.00 cm/s LVOT Vmean:  83.500 cm/s LVOT VTI:    0.237 m MITRAL VALVE MV Area (PHT): 3.73 cm     SHUNTS MV E velocity: 117.00 cm/s  Systemic VTI:  0.24 m MV A velocity: 94.00 cm/s   Systemic Diam: 2.10 cm MV E/A ratio:  1.24 Aditya Sabharwal Electronically signed by Ria Commander Signature Date/Time: 11/18/2023/5:07:00 PM    Final    DG Chest Port 1 View Result Date: 11/17/2023 CLINICAL DATA:  63 year old female with history of shortness of breath. EXAM: PORTABLE CHEST 1 VIEW COMPARISON:  Chest x-ray 02/08/2022. FINDINGS: Image is slightly under penetrated. Lung volumes are normal. No consolidative airspace disease. No pleural effusions. No pneumothorax. No pulmonary nodule or mass noted. Pulmonary vasculature and the cardiomediastinal silhouette are within normal limits. Atherosclerosis in the thoracic aorta. IMPRESSION: 1.  No radiographic evidence of acute cardiopulmonary disease. 2. Aortic atherosclerosis. Electronically Signed   By: Toribio Aye M.D.   On: 11/17/2023 07:11     Assessment & Plan:   1. COPD with acute exacerbation (HCC) (Primary) - Ambulatory Referral for DME  2. Chronic respiratory failure with hypoxia (HCC)  3. OSA (obstructive sleep apnea)      Chronic Obstructive Pulmonary Disease (COPD) and Chronic Respiratory Failure Recent ER visit for cough, treated with prednisone  and antibiotics (Duricef and doxycycline ). Chest X-ray on 11/17/2023 showed no active cardiopulmonary disease. Patient reports improvement  in symptoms with treatment. Patient on 8L home oxygen  and 6L portable oxygen . Currently on Trelegy 100mcg daily. She had wheezing on exam. She has 2-3 days left of prendisone taper and  antibiotics. -Increase dose Trelegy 200mcg daily x 4 week until follow-up (samples given). She has ipratropium-albuterol  nebulizer medication but does not have a nebulizer machine -Complete course of prednisone  and antibiotics. -Order nebulizer machine   Obstructive Sleep Apnea (OSA) Patient intolerant CPAP, stopped use in 2018. No current sleep issues reported. - Continue supplemental oxygen , encourage side sleeping position and weight loss  Pre-op respiratory clearance  Patient scheduled for hemorrhoidectomy with [facility]. No date set. Patient has intermediate to high risk for prolonged mechanical ventilation due to COPD, OSA, respiratory failure and recent respiratory infection. Recommend surgery be done in hospital settings  -Recommend delaying surgery 2-4 weeks until respiratory infection has resolved. -Advise patient to use incentive spirometer before and after surgery. -Encourage ambulation as soon as possible post-surgery to prevent blood clots and pneumonia. -Communicate risk assessment to surgical team.      Follow-up 4 weeks with Dr. Jude at [facility] or [place] NP in [facility], [place] 11/24/2023

## 2023-12-01 ENCOUNTER — Telehealth: Payer: Self-pay | Admitting: Primary Care

## 2023-12-01 NOTE — Telephone Encounter (Signed)
-----   Message from [place] V. Alva sent at 11/28/2023  3:25 PM EST ----- Regarding: RE: Surgical clearance Prefer spinal anesthesia. Otherwise okay to proceed with due risk ----- Message ----- From: Glenford Bayley, NP Sent: 11/24/2023  12:46 PM EST To: Oretha Milch, MD Subject: Surgical clearance                             How do you feel about patient having hemorrhoidectomy under anesthesia, no date set  She has chronic respiratory failure and is on 8L continuous Seen recently in ED for URI, treated with doxy and prednisone. CXR was clear I told her procedure should be done in hospital settings no earlier than February  Recommend follow-up in 4 weeks   -[employer]

## 2023-12-16 DIAGNOSIS — J069 Acute upper respiratory infection, unspecified: Secondary | ICD-10-CM | POA: Diagnosis not present

## 2023-12-16 DIAGNOSIS — R030 Elevated blood-pressure reading, without diagnosis of hypertension: Secondary | ICD-10-CM | POA: Diagnosis not present

## 2023-12-20 NOTE — Progress Notes (Signed)
 Cardiology Office Note:  .   Date:  12/23/2023  ID:  Teresa Roberts, DOB 03-Sep-1961, MRN [id number] PCP: Verena Mems, MD  [facility] Providers Cardiologist:  Shelda Bruckner, MD  History of Present Illness: .   Teresa Roberts is a 63 y.o. female with a past medical history of HTN, migraines, CAD, chronic diastolic heart failure, OSA, COPD, fibromyalgia. She is followed by Dr. Bruckner and presents today for a preoperative evaluation.   Patient previously underwent echocardiogram on 01/26/17 that showed EF 60-65%, no regional wall motion abnormalities, grade II DD. Underwent R/L heart catheterization on 01/27/17 that showed 20% stenosis in mid RCA, normal EF on visual estimate, moderate right sided heart pressure elevation with moderate pulmonary HTN. She was later admitted in 05/2018 with shortness of breath and chest discomfort. She was found to be in a CHF exacerbation. Underwent echocardiogram 05/28/18 that showed EF 50-55%, no regional wall motion abnormalities, severely reduced RV systolic function. She was treated with IV lasix . Her chest pain was atypical so she did not undergo ischemic evaluation. Patient was encouraged to use her CPAP, stop smoking, and use home oxygen . Suspected COPD/OSA were the causes of her cor pulmonale. Later underwent RHC on 07/19/18 that showed mild PAH with mid RV strain. Recommended conservative measures including CPAP, weight loss. Also remained on furosemide .   Patient was last seen by cardiology on 12/02/22. At that time, she reported having episodes of tachycardia about twice per week. She had rare episodes of chest pain that only lasted for about a second at a time. Pains were not associated with exertion. She was started on toprol  25 mg daily.   Patient was recently admitted from 1/2-11/18/23 for a COPD exacerbation. Her husband had found her unresponsive at home around 5 AM. Her concentrator was beeping, and her husband could not wake her up. She was found  to have acute on chronic hypoxic/hypercapnic respiratory failure. CXR showed no evidence of acute cardiopulmonary disease. Her home O2 concentrator was repaired. She was also found to have an RSV infection and procal was elevated, treated with Abx. Echocardiogram on 11/18/23 showed EF 60-65%, no regional wall motion abnormalities, normal RV systolic function, mildly enlarged RV size.   Patient now pending hemorrhoid surgery. Cardiology asked for pre-surgical evaluation   Today patient presents for a preoperative evaluation. She reports intermittent episodes of upper abdomina/chest discomfort, described as a pressure or knot-like sensation, which can occur at rest or with exertion. Seems to happen randomly, and she cannot identify any triggers. The discomfort is not constant and can occur in different areas across the chest. It is relieved if she stretches or massages the area. The patient denies any associated symptoms such as dizziness, palpitations, or syncope. She also reports that her breathing has been stable and she has not noticed any recent changes in her blood pressure. The patient is not very active, but she is able to perform basic activities such as walking indoors and climbing stairs without significant chest pain or shortness of breath. She also reports that she has not been taking her prescribed Lasix  for the past month due to running out of the medication, but has not noticed any swelling or fluid retention.  ROS: Denies worsening shortness of breath, exertional chest pain, syncope, near syncope, palpitations, ankle swelling   Studies Reviewed: .   Cardiac Studies & Procedures   CARDIAC CATHETERIZATION  CARDIAC CATHETERIZATION 07/19/2018  Narrative Findings:  RA = 13 RV = 52/14 PA = 50/19 (33) PCW =  16 Fick cardiac output/index = 4.7/2.3 PVR = 3.6 WU Ao sat = 97% PA sat = 64%, 64% SVC sat = 72%  Assessment:  1. Mild PAH with mild RV strain. Left-sided pressures and cardiac  output look good 2. No evidence of intracardiac shunting  Plan/Discussion:  Would continue with conservative measures including CPAP and weight loss. If pressures worsening can consider selective pulmonary artery vasodilator down the road.  Toribio Fuel, MD 10:40 AM   CARDIAC CATHETERIZATION  CARDIAC CATHETERIZATION 01/27/2017  Narrative  Mid RCA lesion, 20 %stenosed.  The left ventricular ejection fraction is 55-65% by visual estimate.  The left ventricular systolic function is normal.  LV end diastolic pressure is normal.  Moderate right sided heart pressure elevation with moderate pulmonary hypertension.  Normal systolic function with moderate left ventricular hypertrophy with EF estimate of approximately 60%.  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  RECOMMENDATION: Medical therapy  Findings Coronary Findings Diagnostic  Dominance: Right  Left Main Vessel was injected. Vessel is normal in caliber. Vessel is angiographically normal.  Left Anterior Descending Vessel was injected. Vessel is normal in caliber. Vessel is angiographically normal.  Left Circumflex Vessel was injected. Vessel is normal in caliber. Vessel is angiographically normal.  Right Coronary Artery The RCA was a moderate size vessel that had smooth 20% mid narrowing prior to giving rise to a marginal branch.  Intervention  No interventions have been documented.    ECHOCARDIOGRAM  ECHOCARDIOGRAM COMPLETE 11/18/2023  Narrative ECHOCARDIOGRAM REPORT    Patient Name:   Teresa Roberts Date of Exam: 11/18/2023 Medical Rec #:  [id number]  Height:       66.0 in Accession #:    [id number] Weight:       183.6 lb Date of Birth:  November 09, 1961  BSA:          1.929 m Patient Age:    62 years   BP:           150/85 mmHg Patient Gender: F          HR:           81 bpm. Exam Location:  Inpatient  Procedure: 2D Echo, Cardiac  Doppler, Color Doppler and Intracardiac Opacification Agent  Indications:    Acute respiratory distress R06.03  History:        Patient has prior history of Echocardiogram examinations, most recent 03/26/2020. CHF, CAD and Previous Myocardial Infarction, COPD; Risk Factors:Hypertension.  Sonographer:    Lanell Maduro Referring Phys: 81 WHITNEY PLUNKETT  IMPRESSIONS   1. Left ventricular ejection fraction, by estimation, is 60 to 65%. The left ventricle has normal function. The left ventricle has no regional wall motion abnormalities. Left ventricular diastolic parameters were normal. 2. Right ventricular systolic function is normal. The right ventricular size is mildly enlarged. 3. The mitral valve is normal in structure. No evidence of mitral valve regurgitation. No evidence of mitral stenosis. 4. The aortic valve is normal in structure. Aortic valve regurgitation is not visualized. No aortic stenosis is present. 5. The inferior vena cava is normal in size with greater than 50% respiratory variability, suggesting right atrial pressure of 3 mmHg.  FINDINGS Left Ventricle: Left ventricular ejection fraction, by estimation, is 60 to 65%. The left ventricle has normal function. The left ventricle has no regional wall motion abnormalities. Definity  contrast agent was given IV to delineate the left ventricular endocardial borders. The left ventricular internal cavity size  was normal in size. There is no left ventricular hypertrophy. Left ventricular diastolic parameters were normal.  Right Ventricle: The right ventricular size is mildly enlarged. No increase in right ventricular wall thickness. Right ventricular systolic function is normal.  Left Atrium: Left atrial size was normal in size.  Right Atrium: Right atrial size was normal in size.  Pericardium: There is no evidence of pericardial effusion.  Mitral Valve: The mitral valve is normal in structure. No evidence of mitral valve  regurgitation. No evidence of mitral valve stenosis.  Tricuspid Valve: The tricuspid valve is normal in structure. Tricuspid valve regurgitation is not demonstrated. No evidence of tricuspid stenosis.  Aortic Valve: The aortic valve is normal in structure. Aortic valve regurgitation is not visualized. No aortic stenosis is present.  Pulmonic Valve: The pulmonic valve was normal in structure. Pulmonic valve regurgitation is not visualized. No evidence of pulmonic stenosis.  Aorta: The aortic root is normal in size and structure.  Venous: The inferior vena cava is normal in size with greater than 50% respiratory variability, suggesting right atrial pressure of 3 mmHg.  IAS/Shunts: No atrial level shunt detected by color flow Doppler.   LEFT VENTRICLE PLAX 2D LVIDd:         3.50 cm      Diastology LVIDs:         2.80 cm      LV e' medial:    7.72 cm/s LV PW:         0.90 cm      LV E/e' medial:  15.2 LV IVS:        1.00 cm      LV e' lateral:   13.90 cm/s LVOT diam:     2.10 cm      LV E/e' lateral: 8.4 LV SV:         82 LV SV Index:   43 LVOT Area:     3.46 cm  LV Volumes (MOD) LV vol d, MOD A2C: 137.0 ml LV vol d, MOD A4C: 123.0 ml LV vol s, MOD A2C: 50.6 ml LV vol s, MOD A4C: 43.2 ml LV SV MOD A2C:     86.4 ml LV SV MOD A4C:     123.0 ml LV SV MOD BP:      82.6 ml  RIGHT VENTRICLE             IVC RV Basal diam:  3.70 cm     IVC diam: 2.00 cm RV S prime:     10.60 cm/s TAPSE (M-mode): 2.5 cm  LEFT ATRIUM             Index        RIGHT ATRIUM           Index LA diam:        3.50 cm 1.81 cm/m   RA Area:     11.70 cm LA Vol (A2C):   43.7 ml 22.66 ml/m  RA Volume:   26.80 ml  13.90 ml/m LA Vol (A4C):   20.0 ml 10.37 ml/m LA Biplane Vol: 30.4 ml 15.76 ml/m AORTIC VALVE LVOT Vmax:   136.00 cm/s LVOT Vmean:  83.500 cm/s LVOT VTI:    0.237 m  MITRAL VALVE MV Area (PHT): 3.73 cm     SHUNTS MV E velocity: 117.00 cm/s  Systemic VTI:  0.24 m MV A velocity: 94.00 cm/s    Systemic Diam: 2.10 cm MV E/A ratio:  1.24  Aditya Sabharwal Electronically signed by  Aditya Sabharwal Signature Date/Time: 11/18/2023/5:07:00 PM    Final             Risk Assessment/Calculations:             Physical Exam:   VS:  BP 110/74 (BP Location: Right Arm, Patient Position: Sitting, Cuff Size: Normal)   Pulse (!) 104   Ht 5' 6 (1.676 m)   Wt 181 lb (82.1 kg)   LMP 08/19/2012   BMI 29.21 kg/m    Wt Readings from Last 3 Encounters:  12/23/23 181 lb (82.1 kg)  11/24/23 180 lb 3.2 oz (81.7 kg)  11/18/23 183 lb 10.3 oz (83.3 kg)    GEN: Well nourished, well developed in no acute distress. Sitting upright on the exam table  NECK: No JVD; No carotid bruits CARDIAC:  RRR, no murmurs, rubs, gallops RESPIRATORY:  Expiratory wheezing in lung bases, otherwise clear. Normal WOB on home oxygen   ABDOMEN: Soft, non-tender, non-distended EXTREMITIES:  No edema in BLE; No deformity   ASSESSMENT AND PLAN: .    Preoperative Evaluation  - Patient pending hemorrhoid surgery.  - Patient has advanced COPD. Followed by pulmonology and is on 8L home oxygen  and 6L portable oxygen   - Was seen by pulmonology on 11/24/23- per their note, patient has intermediate to high risk for prolonged mechanical ventilation due to COPD, OSA, respiratory failure, and recent respiratory infection. Recommended spinal anesthesia  - From a cardiac standpoint- her most recent ischemic evaluation was a cardiac catheterization in 01/2017 that showed 20% mid RCA stenosis. Echo from 11/2023 showed EF 60-65%, no regional wall motion abnormalities, normal RV function, no significant valvular abnormalities  - She denies worsening shortness of breath. Denies chest pain on exertion. Able to complete 5.62 METs physical activity without chest pain or shortness of breath. No further cardiac workup needed prior to surgery  - Continue ASA 81 mg daily unless surgeon feels it is necessary to hold prior to procedure.   Pulmonary  HTN OSA  COPD  Chronic Diastolic Heart Failure  - Previously had RHC in 2019 that was consistent with mid-moderate pulmonary HTN driven by her COPD, OSA (WHO class 3 PH) - Most recent echo from 11/2023 showed EF 60-65%, normal RV function, mild RV enlargement   - Continue use of home oxygen  - Continue CPAP - She was previously on lasix  40 mg daily. Has been out of lasix  for 1 month. Has not noticed any swelling or worsening SOB since that time. BP stable  - Adjusted lasix  to PRN. Discussed taking lasix  for lower extremity swelling, weight gain - K 4.5 and creatinine 0.79 in 11/2023   HTN  - BP well controlled  - Continue metoprolol  succinate 25 mg daily   Nonobstructive CAD  - Cath in 2018 showed 20% mid RCA stenosis  - Patient reports atypical, random knots in her upper abdomen/lower chest. Occur both on right and left side. Happen randomly and are not associated with exertion. Relieved by stretching or massaging the area. Atypical for a cardiac cause. No ischemic workup needed  - Continue ASA 81 mg daily  - Continue metoprolol  succinate 25 mg daily  - Continue simvastatin  10 mg daily   Tobacco Use  - Counseled on tobacco cessation   - Emphasized not smoking while using oxygen    Dispo: Follow up in 1 year   Signed, Rollo FABIENE Louder, PA-C

## 2023-12-23 ENCOUNTER — Ambulatory Visit: Payer: Medicare Other | Attending: Cardiology | Admitting: Cardiology

## 2023-12-23 ENCOUNTER — Encounter: Payer: Self-pay | Admitting: Cardiology

## 2023-12-23 VITALS — BP 110/74 | HR 104 | Ht 66.0 in | Wt 181.0 lb

## 2023-12-23 DIAGNOSIS — I5032 Chronic diastolic (congestive) heart failure: Secondary | ICD-10-CM | POA: Diagnosis not present

## 2023-12-23 DIAGNOSIS — I2510 Atherosclerotic heart disease of native coronary artery without angina pectoris: Secondary | ICD-10-CM | POA: Diagnosis not present

## 2023-12-23 DIAGNOSIS — Z720 Tobacco use: Secondary | ICD-10-CM

## 2023-12-23 DIAGNOSIS — I10 Essential (primary) hypertension: Secondary | ICD-10-CM

## 2023-12-23 DIAGNOSIS — J441 Chronic obstructive pulmonary disease with (acute) exacerbation: Secondary | ICD-10-CM

## 2023-12-23 DIAGNOSIS — G4733 Obstructive sleep apnea (adult) (pediatric): Secondary | ICD-10-CM | POA: Diagnosis not present

## 2023-12-23 DIAGNOSIS — I272 Pulmonary hypertension, unspecified: Secondary | ICD-10-CM | POA: Diagnosis not present

## 2023-12-23 DIAGNOSIS — Z01810 Encounter for preprocedural cardiovascular examination: Secondary | ICD-10-CM | POA: Diagnosis not present

## 2023-12-23 MED ORDER — FUROSEMIDE 40 MG PO TABS: 40.0000 mg | ORAL_TABLET | ORAL | 3 refills | Status: DC | PRN

## 2023-12-23 NOTE — Patient Instructions (Signed)
 Medication Instructions:  Take Lasix  40 mg as needed for any lower extremity Edema *If you need a refill on your cardiac medications before your next appointment, please call your pharmacy*  Lab Work: No labs  Testing/Procedures: No testing  Follow-Up: At [facility], you and your health needs are our priority.  As part of our continuing mission to provide you with exceptional heart care, we have created designated Provider Care Teams.  These Care Teams include your primary Cardiologist (physician) and Advanced Practice Providers (APPs -  Physician Assistants and Nurse Practitioners) who all work together to provide you with the care you need, when you need it.  We recommend signing up for the patient portal called MyChart.  Sign up information is provided on this After Visit Summary.  MyChart is used to connect with patients for Virtual Visits (Telemedicine).  Patients are able to view lab/test results, encounter notes, upcoming appointments, etc.  Non-urgent messages can be sent to your provider as well.   To learn more about what you can do with MyChart, go to [email or web address].    Your next appointment:   1 year(s)  Provider:   Shelda Bruckner, MD

## 2023-12-28 ENCOUNTER — Encounter: Payer: Self-pay | Admitting: Primary Care

## 2023-12-28 ENCOUNTER — Ambulatory Visit: Payer: Medicare Other | Admitting: Primary Care

## 2024-01-02 NOTE — Progress Notes (Signed)
 Office Visit Note  Patient: Teresa Roberts             Date of Birth: [birth year]           MRN: [id number]             PCP: Mila Palmer, MD Referring: Mila Palmer, MD Visit Date: 01/16/2024 Occupation: @GUAROCC @  Subjective:  Trapezius muscle tension and tenderness.    History of Present Illness: Teresa Roberts is a 63 y.o. female with history of fibromyalgia and osteoporosis.  Patient remains on Lyrica, Cymbalta, gabapentin as prescribed.  She has been taking tizanidine as needed for muscle spasms.  She presents today with trapezius muscle tension and tenderness bilaterally.  She requested trigger point injections today.  Patient reports that the last trapezius trigger point injections performed on 10/17/2023 provided about 60% pain relief.   Patient reports that she has been out of her prescription for Fosamax for the past 1 month.  Activities of Daily Living:  Patient reports morning stiffness for 1 hour.   Patient Reports nocturnal pain.  Difficulty dressing/grooming: Reports Difficulty climbing stairs: Reports Difficulty getting out of chair: Reports Difficulty using hands for taps, buttons, cutlery, and/or writing: Reports  Review of Systems  Constitutional:  Positive for fatigue.  HENT:  Positive for mouth dryness and nose dryness. Negative for mouth sores.   Eyes:  Negative for pain and dryness.  Respiratory:  Positive for shortness of breath. Negative for wheezing.   Cardiovascular:  Positive for chest pain and palpitations.  Gastrointestinal:  Negative for blood in stool, constipation and diarrhea.  Endocrine: Negative for increased urination.  Genitourinary:  Negative for involuntary urination.  Musculoskeletal:  Positive for joint pain, gait problem, joint pain, myalgias, muscle weakness, morning stiffness, muscle tenderness and myalgias. Negative for joint swelling.  Skin:  Negative for color change, rash, hair loss and sensitivity to sunlight.  Allergic/Immunologic:  Negative for susceptible to infections.  Neurological:  Negative for dizziness and headaches.  Hematological:  Negative for swollen glands.  Psychiatric/Behavioral:  Positive for depressed mood. Negative for sleep disturbance. The patient is nervous/anxious.     PMFS History:  Patient Active Problem List   Diagnosis Date Noted   Respiratory syncytial virus (RSV) infection 11/18/2023   Acute and chronic respiratory failure (acute-on-chronic) (HCC) 11/17/2023   B12 deficiency 09/13/2023   Sciatica of left side associated with disorder of lumbar spine 09/13/2022   Chronic pain syndrome 09/13/2022   History of colonic polyps 01/01/2021   Esophageal reflux 01/01/2021   Hypercalcemia 09/23/2020   Hyperparathyroidism, primary (HCC) 08/25/2020   Iron deficiency anemia 06/27/2019   Coronary artery disease involving native coronary artery of native heart without angina pectoris 09/05/2018   Pain in joint of left elbow 09/04/2018   HLD (hyperlipidemia) 05/27/2018   Essential hypertension 05/27/2018   Depression 05/27/2018   OSA (obstructive sleep apnea) 05/03/2018   Pulmonary hypertension (HCC) 05/03/2018   Microcytic anemia 10/19/2017   Chronic diastolic (congestive) heart failure (HCC) 08/09/2017   Other fatigue 06/13/2017   Primary insomnia 06/13/2017   History of migraine 06/13/2017   COPD with acute exacerbation (HCC) 02/15/2017   Tobacco abuse 02/15/2017   Chronic respiratory failure with hypoxia, on home O2 therapy (HCC) - on 6-8 liters/min 01/28/2017   Migraine 01/28/2017   Daily headache    MGUS (monoclonal gammopathy of unknown significance) 01/11/2012   Fibromyalgia 01/11/2012   Bruises easily 01/11/2012    Past Medical History:  Diagnosis Date   CAD (coronary artery  disease)    a. 01/2017: cath showing 20% mid RCA stenosis and otherwise normal LAD and LCx with a preserved EF of 55 to 60%; mod pulmonary HTN   Carpal tunnel syndrome    COPD (chronic obstructive pulmonary  disease) (HCC)    O2 dependent   cpap    Depression    PTSD   Diastolic CHF (HCC)    Fibromyalgia    Hyperlipidemia    Hypertension    Migraines    Osteoporosis    Sciatic nerve disease    Type 2 myocardial infarction without ST elevation (HCC) 01/26/2017   IMO SNOMED Dx Update Oct 2024      Family History  Problem Relation Age of Onset   Diabetes Mother    Hypertension Mother    CAD Mother    Diabetes Father    Lung cancer Father    Diabetes Sister    Hypercalcemia Neg Hx    Breast cancer Neg Hx    Past Surgical History:  Procedure Laterality Date   APPENDECTOMY     BIOPSY  01/01/2021   Procedure: BIOPSY;  Surgeon: Charlott Rakes, MD;  Location: [facility];  Service: Endoscopy;;   CESAREAN SECTION     COLONOSCOPY WITH PROPOFOL N/A 01/01/2021   Procedure: COLONOSCOPY WITH PROPOFOL;  Surgeon: Charlott Rakes, MD;  Location: [facility];  Service: Endoscopy;  Laterality: N/A;   ESOPHAGOGASTRODUODENOSCOPY (EGD) WITH PROPOFOL N/A 01/01/2021   Procedure: ESOPHAGOGASTRODUODENOSCOPY (EGD) WITH PROPOFOL;  Surgeon: Charlott Rakes, MD;  Location: [facility];  Service: Endoscopy;  Laterality: N/A;   ORIF ANKLE FRACTURE Left 08/12/2017   Procedure: OPEN REDUCTION INTERNAL FIXATION (ORIF) ANKLE FRACTURE;  Surgeon: Samson Frederic, MD;  Location: [facility];  Service: Orthopedics;  Laterality: Left;   RIGHT HEART CATH N/A 07/19/2018   Procedure: RIGHT HEART CATH;  Surgeon: Dolores Patty, MD;  Location: [facility];  Service: Cardiovascular;  Laterality: N/A;   RIGHT/LEFT HEART CATH AND CORONARY ANGIOGRAPHY N/A 01/27/2017   Procedure: Right/Left Heart Cath and Coronary Angiography;  Surgeon: Lennette Bihari, MD;  Location: [facility];  Service: Cardiovascular;  Laterality: N/A;   Social History   Social History Narrative   Not on file   Immunization History  Administered Date(s) Administered   Influenza Split 08/15/2017, 08/28/2018, 08/24/2019, 08/16/2023    Influenza,inj,Quad PF,6+ Mos 12/28/2016, 09/28/2017, 09/04/2018, 08/03/2019, 08/29/2020, 08/03/2021, 08/11/2022   Influenza,inj,quad, With Preservative 08/09/2014   Influenza-Unspecified 08/15/2016   Moderna Sars-Covid-2 Vaccination 02/11/2020, 03/10/2020, 07/11/2020   Pneumococcal Polysaccharide-23 02/04/2014, 08/31/2016   Pneumococcal-Unspecified 02/13/2013   Respiratory Syncytial Virus Vaccine,Recomb Aduvanted(Arexvy) 08/11/2022   Tdap 04/25/2013, 02/04/2014   Zoster, Live 05/31/2019, 08/03/2019     Objective: Vital Signs: BP 133/84 (BP Location: Left Arm, Patient Position: Sitting, Cuff Size: Normal)   Pulse 92   Ht 5\' 6"  (1.676 m)   Wt 191 lb (86.6 kg)   LMP 08/19/2012   BMI 30.83 kg/m    Physical Exam Vitals and nursing note reviewed.  Constitutional:      Appearance: She is well-developed.  HENT:     Head: Normocephalic and atraumatic.  Eyes:     Conjunctiva/sclera: Conjunctivae normal.  Cardiovascular:     Rate and Rhythm: Normal rate and regular rhythm.     Heart sounds: Normal heart sounds.  Pulmonary:     Effort: Pulmonary effort is normal.     Breath sounds: Normal breath sounds.  Abdominal:     General: Bowel sounds are normal.  Palpations: Abdomen is soft.  Musculoskeletal:     Cervical back: Normal range of motion.  Lymphadenopathy:     Cervical: No cervical adenopathy.  Skin:    General: Skin is warm and dry.     Capillary Refill: Capillary refill takes less than 2 seconds.  Neurological:     Mental Status: She is alert and oriented to person, place, and time.  Psychiatric:        Behavior: Behavior normal.      Musculoskeletal Exam: C-spine has painful limited range of motion especially with rotation.  Trapezius muscle tension and tenderness bilaterally.  Shoulder joints have limited abduction to about 110 degrees.  Elbow joints, wrist joints, MCPs, PIPs, DIPs have good range of motion with no synovitis.  Complete fist formation bilaterally. Knee  joints have good range of motion with no warmth or effusion.  Ankle joints have good range of motion with no tenderness or joint swelling.  CDAI Exam: CDAI Score: -- Patient Global: --; Provider Global: -- Swollen: --; Tender: -- Joint Exam 01/16/2024   No joint exam has been documented for this visit   There is currently no information documented on the homunculus. Go to the Rheumatology activity and complete the homunculus joint exam.  Investigation: No additional findings.  Imaging: No results found.  Recent Labs: Lab Results  Component Value Date   WBC 10.1 11/18/2023   HGB 9.8 (L) 11/18/2023   PLT 172 11/18/2023   NA 139 11/18/2023   K 4.5 11/18/2023   CL 101 11/18/2023   CO2 28 11/18/2023   GLUCOSE 128 (H) 11/18/2023   BUN 13 11/18/2023   CREATININE 0.79 11/18/2023   BILITOT 0.3 11/17/2023   ALKPHOS 95 11/17/2023   AST 22 11/17/2023   ALT 15 11/17/2023   PROT 7.1 11/17/2023   ALBUMIN 3.5 11/17/2023   CALCIUM 9.6 11/18/2023   GFRAA >60 08/27/2019    Speciality Comments: No specialty comments available.  Procedures:  Trigger Point Inj  Date/Time: 01/16/2024 1:23 PM  Performed by: Gearldine Bienenstock, PA-C Authorized by: Gearldine Bienenstock, PA-C   Consent Given by:  Patient Site marked: the procedure site was marked   Timeout: prior to procedure the correct patient, procedure, and site was verified   Indications:  Pain Total # of Trigger Points:  2 Location: neck   Needle Size:  27 G Approach:  Dorsal Medications #1:  0.5 mL lidocaine 1 %; 10 mg triamcinolone acetonide 40 MG/ML Medications #2:  0.5 mL lidocaine 1 %; 10 mg triamcinolone acetonide 40 MG/ML Patient tolerance:  Patient tolerated the procedure well with no immediate complications  Allergies: Citrus and Lisinopril   Assessment / Plan:     Visit Diagnoses: Fibromyalgia - She continues to experience intermittent myalgias and muscle tenderness due to fibromyalgia.  She remains on Lyrica, gabapentin,  and Cymbalta as prescribed.  She takes tizanidine as needed for muscle spasms.  She presents today with trapezius muscle tension and tenderness bilaterally.  She requested repeat trigger point injections today.  Patient reports that she had about a 60% improvement in her symptoms after the last trapezius trigger point injections performed on 10/17/2023.  She does not need any refills at this time.  Discussed the importance of regular exercise and good sleep hygiene.  Other fatigue: Chronic, stable.   Trapezius muscle spasm - She presents today with a recurrence of pain and stiffness involving the cervical spine.  She has trapezius muscle tension and tenderness bilaterally which have limited  her cervical spine range of motion and shoulder range of motion.  She had bilateral trapezius trigger point injections performed on 10/17/2023 which abided about 60% improvement in her symptoms.  She requested trapezius trigger point injections to be performed today.  She tolerated procedures well.  Procedure notes were completed above.  Aftercare was discussed.  Plan: Trigger Point Inj  Primary insomnia -She is taking trazodone 150 mg by mouth at bedtime for insomnia.  Bilateral carpal tunnel syndrome - Bilateral mild carpal tunnel syndrome was noted on the nerve conduction velocities 2023 by Dr. Alvester Morin.  Trochanteric bursitis of both hips: Intermittent discomfort.  Discussed importance of performing stretching exercises daily.  Chronic left-sided low back pain without sciatica: Chronic pain.  No symptoms of sciatica at this time.  Age-related osteoporosis without current pathological fracture - 10/20/22 DEXA scan BMD 0.492, T-score -3.4 left forearm radius 33%.  She is taking vitamin D 50,000 units once weekly.  She is prescribed Fosamax 70 mg 1 tablet by mouth once weekly.  Patient reports that she has been out of the prescription for Fosamax for the past 1 month.  A refill of Fosamax was sent to the pharmacy  today. Due to update DEXA in December 2025.  Vitamin D deficiency: She is taking vitamin D 50,000 units once weekly.    Other medical conditions are listed as follows:   Pulmonary hypertension (HCC)  History of COPD  Coronary artery disease involving native coronary artery of native heart without angina pectoris  Essential hypertension: Blood pressure was 133/84 today in the office.  Chronic diastolic (congestive) heart failure (HCC)  Gastroesophageal reflux disease, unspecified whether esophagitis present  History of migraine  MGUS (monoclonal gammopathy of unknown significance)  Anxiety  Former smoker  Orders: Orders Placed This Encounter  Procedures   Trigger Point Inj   Meds ordered this encounter  Medications   alendronate (FOSAMAX) 70 MG tablet    Sig: Take 1 tablet (70 mg total) by mouth once a week. Take with a full glass of water on an empty stomach.    Dispense:  12 tablet    Refill:  0     Follow-Up Instructions: Return in about 3 months (around 04/17/2024) for Fibromyalgia.   Gearldine Bienenstock, PA-C  Note - This record has been created using Dragon software.  Chart creation errors have been sought, but may not always  have been located. Such creation errors do not reflect on  the standard of medical care.

## 2024-01-06 ENCOUNTER — Other Ambulatory Visit (HOSPITAL_COMMUNITY): Payer: Self-pay

## 2024-01-16 ENCOUNTER — Encounter: Payer: Self-pay | Admitting: Physician Assistant

## 2024-01-16 ENCOUNTER — Ambulatory Visit: Payer: Medicare Other | Attending: Physician Assistant | Admitting: Physician Assistant

## 2024-01-16 VITALS — BP 133/84 | HR 92 | Ht 66.0 in | Wt 191.0 lb

## 2024-01-16 DIAGNOSIS — M7061 Trochanteric bursitis, right hip: Secondary | ICD-10-CM | POA: Diagnosis not present

## 2024-01-16 DIAGNOSIS — M62838 Other muscle spasm: Secondary | ICD-10-CM | POA: Diagnosis not present

## 2024-01-16 DIAGNOSIS — I272 Pulmonary hypertension, unspecified: Secondary | ICD-10-CM

## 2024-01-16 DIAGNOSIS — Z8709 Personal history of other diseases of the respiratory system: Secondary | ICD-10-CM | POA: Diagnosis not present

## 2024-01-16 DIAGNOSIS — F5101 Primary insomnia: Secondary | ICD-10-CM | POA: Diagnosis not present

## 2024-01-16 DIAGNOSIS — M545 Low back pain, unspecified: Secondary | ICD-10-CM

## 2024-01-16 DIAGNOSIS — G8929 Other chronic pain: Secondary | ICD-10-CM

## 2024-01-16 DIAGNOSIS — R5383 Other fatigue: Secondary | ICD-10-CM

## 2024-01-16 DIAGNOSIS — F419 Anxiety disorder, unspecified: Secondary | ICD-10-CM

## 2024-01-16 DIAGNOSIS — E559 Vitamin D deficiency, unspecified: Secondary | ICD-10-CM | POA: Diagnosis not present

## 2024-01-16 DIAGNOSIS — I2510 Atherosclerotic heart disease of native coronary artery without angina pectoris: Secondary | ICD-10-CM | POA: Diagnosis not present

## 2024-01-16 DIAGNOSIS — Z87891 Personal history of nicotine dependence: Secondary | ICD-10-CM

## 2024-01-16 DIAGNOSIS — M7062 Trochanteric bursitis, left hip: Secondary | ICD-10-CM

## 2024-01-16 DIAGNOSIS — I5032 Chronic diastolic (congestive) heart failure: Secondary | ICD-10-CM

## 2024-01-16 DIAGNOSIS — G5603 Carpal tunnel syndrome, bilateral upper limbs: Secondary | ICD-10-CM

## 2024-01-16 DIAGNOSIS — D472 Monoclonal gammopathy: Secondary | ICD-10-CM

## 2024-01-16 DIAGNOSIS — Z8669 Personal history of other diseases of the nervous system and sense organs: Secondary | ICD-10-CM

## 2024-01-16 DIAGNOSIS — K219 Gastro-esophageal reflux disease without esophagitis: Secondary | ICD-10-CM

## 2024-01-16 DIAGNOSIS — M810 Age-related osteoporosis without current pathological fracture: Secondary | ICD-10-CM

## 2024-01-16 DIAGNOSIS — M797 Fibromyalgia: Secondary | ICD-10-CM | POA: Diagnosis not present

## 2024-01-16 DIAGNOSIS — I10 Essential (primary) hypertension: Secondary | ICD-10-CM

## 2024-01-16 MED ORDER — TRIAMCINOLONE ACETONIDE 40 MG/ML IJ SUSP
10.0000 mg | INTRAMUSCULAR | Status: AC | PRN
  Administered 2024-01-16: 10 mg via INTRAMUSCULAR

## 2024-01-16 MED ORDER — LIDOCAINE HCL 1 % IJ SOLN
0.5000 mL | INTRAMUSCULAR | Status: AC | PRN
  Administered 2024-01-16: .5 mL

## 2024-01-16 MED ORDER — ALENDRONATE SODIUM 70 MG PO TABS: 70.0000 mg | ORAL_TABLET | ORAL | 0 refills | Status: DC

## 2024-01-18 ENCOUNTER — Other Ambulatory Visit: Payer: Self-pay | Admitting: Physical Medicine and Rehabilitation

## 2024-01-22 ENCOUNTER — Other Ambulatory Visit: Payer: Self-pay | Admitting: Adult Health

## 2024-01-23 NOTE — Progress Notes (Addendum)
 Anesthesia Review:  PCP:  Mila Palmer  Cardiologist : Robet Leu LOV 12/23/23  Pulmonology- Ames Dura L[facility] 11/24/23  PPM/ ICD: Device Orders: Rep Notified:  Chest x-ray : 11/17/23- 1 view  EKG : 12/23/23  Echo : 11/18/23  Stress test: Cardiac Cath :  2019   Activity level:  Sleep Study/ CPAP : Fasting Blood Sugar :      / Checks Blood Sugar -- times a day:    Blood Thinner/ Instructions /Last Dose: ASA / Instructions/ Last Dose :    Oxygen

## 2024-01-23 NOTE — Patient Instructions (Signed)
 SURGICAL WAITING ROOM VISITATION  Patients having surgery or a procedure may have no more than 2 support people in the waiting area - these visitors may rotate.    Children under the age of 12 must have an adult with them who is not the patient.  Due to an increase in RSV and influenza rates and associated hospitalizations, children ages 31 and under may not visit patients in [facility] hospitals.  Visitors with respiratory illnesses are discouraged from visiting and should remain at home.  If the patient needs to stay at the hospital during part of their recovery, the visitor guidelines for inpatient rooms apply. Pre-op nurse will coordinate an appropriate time for 1 support person to accompany patient in pre-op.  This support person may not rotate.    Please refer to the [facility] website for the visitor guidelines for Inpatients (after your surgery is over and you are in a regular room).       Your procedure is scheduled on: 01/27/2024    Report to [facility] Main Entrance    Report to admitting at   1215pm   Call this number if you have problems the morning of surgery [phone]   Do not eat food  or drink liquids :After Midnight.                                If you have questions, please contact your surgeon's office.       Oral Hygiene is also important to reduce your risk of infection.                                    Remember - BRUSH YOUR TEETH THE MORNING OF SURGERY WITH YOUR REGULAR TOOTHPASTE  DENTURES WILL BE REMOVED PRIOR TO SURGERY PLEASE DO NOT APPLY "Poly grip" OR ADHESIVES!!!   Do NOT smoke after Midnight   Stop all vitamins and herbal supplements 7 days before surgery.   Take these medicines the morning of surgery with A SIP OF WATER:  inhalers as usual and bring, buspar, eye drops as usual cymbalta, nexium if needed, lyrica, nebulizer if needed   DO NOT TAKE ANY ORAL DIABETIC MEDICATIONS DAY OF YOUR SURGERY  Bring CPAP mask and  tubing day of surgery.                              You may not have any metal on your body including hair pins, jewelry, and body piercing             Do not wear make-up, lotions, powders, perfumes/cologne, or deodorant  Do not wear nail polish including gel and S&S, artificial/acrylic nails, or any other type of covering on natural nails including finger and toenails. If you have artificial nails, gel coating, etc. that needs to be removed by a nail salon please have this removed prior to surgery or surgery may need to be canceled/ delayed if the surgeon/ anesthesia feels like they are unable to be safely monitored.   Do not shave  48 hours prior to surgery.               Men may shave face and neck.   Do not bring valuables to the hospital.  IS NOT  RESPONSIBLE   FOR VALUABLES.   Contacts, glasses, dentures or bridgework may not be worn into surgery.   Bring small overnight bag day of surgery.   DO NOT BRING YOUR HOME MEDICATIONS TO THE HOSPITAL. PHARMACY WILL DISPENSE MEDICATIONS LISTED ON YOUR MEDICATION LIST TO YOU DURING YOUR ADMISSION IN THE HOSPITAL!    Patients discharged on the day of surgery will not be allowed to drive home.  Someone NEEDS to stay with you for the first 24 hours after anesthesia.   Special Instructions: Bring a copy of your healthcare power of attorney and living will documents the day of surgery if you haven't scanned them before.              Please read over the following fact sheets you were given: IF YOU HAVE QUESTIONS ABOUT YOUR PRE-OP INSTRUCTIONS PLEASE CALL [phone]   If you received a COVID test during your pre-op visit  it is requested that you wear a mask when out in public, stay away from anyone that may not be feeling well and notify your surgeon if you develop symptoms. If you test positive for Covid or have been in contact with anyone that has tested positive in the last 10 days please notify you surgeon.    [facility] - Preparing for Surgery Before surgery, you can play an important role.  Because skin is not sterile, your skin needs to be as free of germs as possible.  You can reduce the number of germs on your skin by washing with CHG (chlorahexidine gluconate) soap before surgery.  CHG is an antiseptic cleaner which kills germs and bonds with the skin to continue killing germs even after washing. Please DO NOT use if you have an allergy to CHG or antibacterial soaps.  If your skin becomes reddened/irritated stop using the CHG and inform your nurse when you arrive at Short Stay. Do not shave (including legs and underarms) for at least 48 hours prior to the first CHG shower.  You may shave your face/neck. Please follow these instructions carefully:  1.  Shower with CHG Soap the night before surgery and the  morning of Surgery.  2.  If you choose to wash your hair, wash your hair first as usual with your  normal  shampoo.  3.  After you shampoo, rinse your hair and body thoroughly to remove the  shampoo.                           4.  Use CHG as you would any other liquid soap.  You can apply chg directly  to the skin and wash                       Gently with a scrungie or clean washcloth.  5.  Apply the CHG Soap to your body ONLY FROM THE NECK DOWN.   Do not use on face/ open                           Wound or open sores. Avoid contact with eyes, ears mouth and genitals (private parts).                       Wash face,  Genitals (private parts) with your normal soap.             6.  Wash thoroughly,  paying special attention to the area where your surgery  will be performed.  7.  Thoroughly rinse your body with warm water from the neck down.  8.  DO NOT shower/wash with your normal soap after using and rinsing off  the CHG Soap.                9.  Pat yourself dry with a clean towel.            10.  Wear clean pajamas.            11.  Place clean sheets on your bed the night of your first shower and do not   sleep with pets. Day of Surgery : Do not apply any lotions/deodorants the morning of surgery.  Please wear clean clothes to the hospital/surgery center.  FAILURE TO FOLLOW THESE INSTRUCTIONS MAY RESULT IN THE CANCELLATION OF YOUR SURGERY PATIENT SIGNATURE_________________________________  NURSE SIGNATURE__________________________________  ________________________________________________________________________

## 2024-01-25 ENCOUNTER — Encounter (HOSPITAL_COMMUNITY): Payer: Self-pay

## 2024-01-25 ENCOUNTER — Other Ambulatory Visit: Payer: Self-pay

## 2024-01-25 ENCOUNTER — Encounter (HOSPITAL_COMMUNITY)
Admission: RE | Admit: 2024-01-25 | Discharge: 2024-01-25 | Disposition: A | Discharge status: Home / Self Care | Source: Ambulatory Visit | Attending: General Surgery | Admitting: General Surgery

## 2024-01-25 HISTORY — DX: Unspecified osteoarthritis, unspecified site: M19.90

## 2024-01-26 NOTE — Anesthesia Preprocedure Evaluation (Signed)
 Anesthesia Evaluation  Patient identified by MRN, date of birth, ID band Patient awake    Reviewed: Allergy & Precautions, NPO status , Patient's Chart, lab work & pertinent test results, reviewed documented beta blocker date and time   Airway Mallampati: III  TM Distance: >3 FB Neck ROM: Full    Dental  (+) Teeth Intact, Dental Advisory Given   Pulmonary sleep apnea , COPD (8L [place]),  COPD inhaler and oxygen dependent, Current Smoker and Patient abstained from smoking.   Pulmonary exam normal breath sounds clear to auscultation       Cardiovascular hypertension, Pt. on home beta blockers + CAD and +CHF  Normal cardiovascular exam Rhythm:Regular Rate:Normal     Neuro/Psych  Headaches PSYCHIATRIC DISORDERS  Depression     Neuromuscular disease    GI/Hepatic Neg liver ROS,GERD  Medicated,,  Endo/Other  negative endocrine ROS    Renal/GU negative Renal ROS     Musculoskeletal  (+) Arthritis ,  Fibromyalgia -  Abdominal   Peds  Hematology negative hematology ROS (+)   Anesthesia Other Findings Day of surgery medications reviewed with the patient.  Reproductive/Obstetrics                             Anesthesia Physical Anesthesia Plan  ASA: 4  Anesthesia Plan: MAC   Post-op Pain Management: Tylenol PO (pre-op)* and Celebrex PO (pre-op)*   Induction: Intravenous  PONV Risk Score and Plan: 2 and Midazolam, Dexamethasone, Ondansetron and TIVA  Airway Management Planned: Natural Airway and Simple Face Mask  Additional Equipment:   Intra-op Plan:   Post-operative Plan:   Informed Consent: I have reviewed the patients History and Physical, chart, labs and discussed the procedure including the risks, benefits and alternatives for the proposed anesthesia with the patient or authorized representative who has indicated his/her understanding and acceptance.     Dental advisory given  Plan  Discussed with: CRNA  Anesthesia Plan Comments: (See PAT note 01/25/2024)       Anesthesia Quick Evaluation

## 2024-01-26 NOTE — Progress Notes (Signed)
 Anesthesia Chart Review   Case: [id number] Date/Time: 01/27/24 1400   Procedure: HEMORRHOIDECTOMY   Anesthesia type: General   Diagnosis:      External hemorrhoids [K64.4]     Bleeding internal hemorrhoids [K64.8]   Pre-op diagnosis: INTERNAL AND EXTERNAL BLEEDING HEMORRHOIDS   Location: [facility] ROOM 01 / [facility]   Surgeons: [name], MD       DISCUSSION:63 y.o. smoker with h/o HTN, COPD (Patient on 8L home oxygen and 6L portable oxygen), pulmonary HTN, OSA, nonobstructive CAD, chronic diastolic heart failure, internal and external hemorrhoids scheduled for above procedure 01/27/2024 with Dr. [name].   Pt seen by pulmonology 11/24/2023. Per OV note, "Patient scheduled for hemorrhoidectomy with [facility]. No date set. Patient has intermediate to high risk for prolonged mechanical ventilation due to COPD, OSA, respiratory failure and recent respiratory infection. Recommend surgery be done in hospital settings  -Recommend delaying surgery 2-4 weeks until respiratory infection has resolved. -Advise patient to use incentive spirometer before and after surgery. -Encourage ambulation as soon as possible post-surgery to prevent blood clots and pneumonia."  Pt treated for COPD exacerbation in January, per notes pulmonology recommended to postpone surgery until February.  Pt stable per PAT nurse interview. Per pulmonology note prefer spinal anesthesia.     Pt last seen by cardiology 12/23/2023. Per OV note, "- Patient pending hemorrhoid surgery.  - Patient has advanced COPD. Followed by pulmonology and is on 8L home oxygen and 6L portable oxygen  - Was seen by pulmonology on 11/24/23- per their note, patient has intermediate to high risk for prolonged mechanical ventilation due to COPD, OSA, respiratory failure, and recent respiratory infection. Recommended spinal anesthesia  - From a cardiac standpoint- her most recent ischemic evaluation was a cardiac catheterization in 01/2017 that  showed 20% mid RCA stenosis. Echo from 11/2023 showed EF 60-65%, no regional wall motion abnormalities, normal RV function, no significant valvular abnormalities  - She denies worsening shortness of breath. Denies chest pain on exertion. Able to complete 5.62 METs physical activity without chest pain or shortness of breath. No further cardiac workup needed prior to surgery  - Continue ASA 81 mg daily unless surgeon feels it is necessary to hold prior to procedure. "  Labs to be done DOS, pt not seen in person.   VS: LMP 08/19/2012   PROVIDERS: Mila Palmer, MD is PCP   Cardiologist:  Jodelle Red, MD  LABS:  labs DOS (all labs ordered are listed, but only abnormal results are displayed)  Labs Reviewed - No data to display   IMAGES:   EKG:   CV: Echo 11/18/2023 1. Left ventricular ejection fraction, by estimation, is 60 to 65%. The  left ventricle has normal function. The left ventricle has no regional  wall motion abnormalities. Left ventricular diastolic parameters were  normal.   2. Right ventricular systolic function is normal. The right ventricular  size is mildly enlarged.   3. The mitral valve is normal in structure. No evidence of mitral valve  regurgitation. No evidence of mitral stenosis.   4. The aortic valve is normal in structure. Aortic valve regurgitation is  not visualized. No aortic stenosis is present.   5. The inferior vena cava is normal in size with greater than 50%  respiratory variability, suggesting right atrial pressure of 3 mmHg.  Past Medical History:  Diagnosis Date   Arthritis    Carpal tunnel syndrome    COPD (chronic obstructive pulmonary disease) (HCC)  O2 dependent   Depression    PTSD   Fibromyalgia    Hyperlipidemia    Hypertension    off of x 4 years   Migraines    Osteoporosis    Sciatic nerve disease     Past Surgical History:  Procedure Laterality Date   APPENDECTOMY     BIOPSY  01/01/2021   Procedure: BIOPSY;   Surgeon: Charlott Rakes, MD;  Location: [facility];  Service: Endoscopy;;   CESAREAN SECTION     COLONOSCOPY WITH PROPOFOL N/A 01/01/2021   Procedure: COLONOSCOPY WITH PROPOFOL;  Surgeon: Charlott Rakes, MD;  [facility];  Service: Endoscopy;  Laterality: N/A;   ESOPHAGOGASTRODUODENOSCOPY (EGD) WITH PROPOFOL N/A 01/01/2021   Procedure: ESOPHAGOGASTRODUODENOSCOPY (EGD) WITH PROPOFOL;  Surgeon: Charlott Rakes, MD;  [facility];  Service: Endoscopy;  Laterality: N/A;   ORIF ANKLE FRACTURE Left 08/12/2017   Procedure: OPEN REDUCTION INTERNAL FIXATION (ORIF) ANKLE FRACTURE;  Surgeon: Samson Frederic, MD;  Location: [facility];  Service: Orthopedics;  Laterality: Left;   RIGHT HEART CATH N/A 07/19/2018   Procedure: RIGHT HEART CATH;  Surgeon: Dolores Patty, MD;  Location: [facility];  Service: Cardiovascular;  Laterality: N/A;   RIGHT/LEFT HEART CATH AND CORONARY ANGIOGRAPHY N/A 01/27/2017   Procedure: Right/Left Heart Cath and Coronary Angiography;  Surgeon: Lennette Bihari, MD;  Location: [facility];  Service: Cardiovascular;  Laterality: N/A;    MEDICATIONS:  albuterol (VENTOLIN HFA) 108 (90 Base) MCG/ACT inhaler   alendronate (FOSAMAX) 70 MG tablet   aspirin 81 MG EC tablet   busPIRone (BUSPAR) 10 MG tablet   dorzolamide-timolol (COSOPT) 22.3-6.8 MG/ML ophthalmic solution   DULoxetine (CYMBALTA) 30 MG capsule   DULoxetine (CYMBALTA) 60 MG capsule   esomeprazole (NEXIUM) 40 MG capsule   Fluticasone-Umeclidin-Vilant (TRELEGY ELLIPTA) 200-62.5-25 MCG/ACT AEPB   furosemide (LASIX) 40 MG tablet   gabapentin (NEURONTIN) 800 MG tablet   ipratropium-albuterol (DUONEB) 0.5-2.5 (3) MG/3ML SOLN   metoprolol succinate (TOPROL XL) 25 MG 24 hr tablet   OXYGEN   pregabalin (LYRICA) 75 MG capsule   simvastatin (ZOCOR) 10 MG tablet   tiZANidine (ZANAFLEX) 4 MG tablet   topiramate (TOPAMAX) 25 MG tablet   traZODone (DESYREL) 150 MG tablet   TRELEGY ELLIPTA  100-62.5-25 MCG/ACT AEPB   Vitamin D, Ergocalciferol, (DRISDOL) 1.25 MG (50000 UNIT) CAPS capsule   No current facility-administered medications for this encounter.    0.9 %  sodium chloride infusion (Manually program via Guardrails IV Fluids)   0.9 %  sodium chloride infusion (Manually program via Guardrails IV Fluids)   sodium chloride flush (NS) 0.9 % injection 3 mL     [facility], PA-C [facility] Pre-Surgical Testing [phone]

## 2024-01-27 ENCOUNTER — Encounter (HOSPITAL_COMMUNITY): Payer: Self-pay | Admitting: General Surgery

## 2024-01-27 ENCOUNTER — Ambulatory Visit (HOSPITAL_COMMUNITY)
Admission: RE | Admit: 2024-01-27 | Discharge: 2024-01-27 | Disposition: A | Discharge status: Home / Self Care | Attending: General Surgery | Admitting: General Surgery

## 2024-01-27 ENCOUNTER — Encounter (HOSPITAL_COMMUNITY)
Admission: RE | Disposition: A | Discharge status: Home / Self Care | Payer: Self-pay | Source: Home / Self Care | Attending: General Surgery

## 2024-01-27 ENCOUNTER — Ambulatory Visit (HOSPITAL_BASED_OUTPATIENT_CLINIC_OR_DEPARTMENT_OTHER): Admitting: Anesthesiology

## 2024-01-27 ENCOUNTER — Other Ambulatory Visit: Payer: Self-pay

## 2024-01-27 ENCOUNTER — Ambulatory Visit (HOSPITAL_COMMUNITY): Admitting: Physician Assistant

## 2024-01-27 DIAGNOSIS — Z9981 Dependence on supplemental oxygen: Secondary | ICD-10-CM | POA: Diagnosis not present

## 2024-01-27 DIAGNOSIS — M1990 Unspecified osteoarthritis, unspecified site: Secondary | ICD-10-CM | POA: Diagnosis not present

## 2024-01-27 DIAGNOSIS — K648 Other hemorrhoids: Secondary | ICD-10-CM

## 2024-01-27 DIAGNOSIS — Z79899 Other long term (current) drug therapy: Secondary | ICD-10-CM | POA: Diagnosis not present

## 2024-01-27 DIAGNOSIS — I2510 Atherosclerotic heart disease of native coronary artery without angina pectoris: Secondary | ICD-10-CM | POA: Diagnosis not present

## 2024-01-27 DIAGNOSIS — K644 Residual hemorrhoidal skin tags: Secondary | ICD-10-CM | POA: Insufficient documentation

## 2024-01-27 DIAGNOSIS — K642 Third degree hemorrhoids: Secondary | ICD-10-CM | POA: Diagnosis not present

## 2024-01-27 DIAGNOSIS — Z7982 Long term (current) use of aspirin: Secondary | ICD-10-CM | POA: Diagnosis not present

## 2024-01-27 DIAGNOSIS — I110 Hypertensive heart disease with heart failure: Secondary | ICD-10-CM | POA: Diagnosis not present

## 2024-01-27 DIAGNOSIS — I5032 Chronic diastolic (congestive) heart failure: Secondary | ICD-10-CM | POA: Insufficient documentation

## 2024-01-27 DIAGNOSIS — J449 Chronic obstructive pulmonary disease, unspecified: Secondary | ICD-10-CM | POA: Insufficient documentation

## 2024-01-27 DIAGNOSIS — F17210 Nicotine dependence, cigarettes, uncomplicated: Secondary | ICD-10-CM | POA: Insufficient documentation

## 2024-01-27 DIAGNOSIS — K219 Gastro-esophageal reflux disease without esophagitis: Secondary | ICD-10-CM | POA: Insufficient documentation

## 2024-01-27 DIAGNOSIS — F32A Depression, unspecified: Secondary | ICD-10-CM | POA: Insufficient documentation

## 2024-01-27 DIAGNOSIS — K641 Second degree hemorrhoids: Secondary | ICD-10-CM | POA: Diagnosis not present

## 2024-01-27 DIAGNOSIS — G4733 Obstructive sleep apnea (adult) (pediatric): Secondary | ICD-10-CM | POA: Diagnosis not present

## 2024-01-27 DIAGNOSIS — M797 Fibromyalgia: Secondary | ICD-10-CM | POA: Diagnosis not present

## 2024-01-27 DIAGNOSIS — K645 Perianal venous thrombosis: Secondary | ICD-10-CM | POA: Diagnosis not present

## 2024-01-27 DIAGNOSIS — R519 Headache, unspecified: Secondary | ICD-10-CM | POA: Insufficient documentation

## 2024-01-27 HISTORY — PX: HEMORRHOID SURGERY: SHX153

## 2024-01-27 SURGERY — HEMORRHOIDECTOMY
Anesthesia: General

## 2024-01-27 MED ORDER — BUPIVACAINE-EPINEPHRINE 0.5% -1:200000 IJ SOLN
INTRAMUSCULAR | Status: DC | PRN
  Administered 2024-01-27: 30 mL

## 2024-01-27 MED ORDER — BUPIVACAINE LIPOSOME 1.3 % IJ SUSP
20.0000 mL | Freq: Once | INTRAMUSCULAR | Status: AC
  Administered 2024-01-27: 20 mL

## 2024-01-27 MED ORDER — FENTANYL CITRATE (PF) 100 MCG/2ML IJ SOLN
INTRAMUSCULAR | Status: AC
  Filled 2024-01-27: qty 2

## 2024-01-27 MED ORDER — PROPOFOL 1000 MG/100ML IV EMUL
INTRAVENOUS | Status: AC
  Filled 2024-01-27: qty 100

## 2024-01-27 MED ORDER — ROCURONIUM BROMIDE 10 MG/ML (PF) SYRINGE
PREFILLED_SYRINGE | INTRAVENOUS | Status: AC
  Filled 2024-01-27: qty 10

## 2024-01-27 MED ORDER — MIDAZOLAM HCL 2 MG/2ML IJ SOLN
INTRAMUSCULAR | Status: DC | PRN
  Administered 2024-01-27: 1 mg via INTRAVENOUS

## 2024-01-27 MED ORDER — BUPIVACAINE LIPOSOME 1.3 % IJ SUSP
INTRAMUSCULAR | Status: AC
  Filled 2024-01-27: qty 20

## 2024-01-27 MED ORDER — DEXAMETHASONE SODIUM PHOSPHATE 10 MG/ML IJ SOLN
INTRAMUSCULAR | Status: AC
  Filled 2024-01-27: qty 1

## 2024-01-27 MED ORDER — PROPOFOL 500 MG/50ML IV EMUL
INTRAVENOUS | Status: AC
  Filled 2024-01-27: qty 50

## 2024-01-27 MED ORDER — DEXAMETHASONE SODIUM PHOSPHATE 10 MG/ML IJ SOLN
INTRAMUSCULAR | Status: DC | PRN
  Administered 2024-01-27: 4 mg via INTRAVENOUS

## 2024-01-27 MED ORDER — KETOROLAC TROMETHAMINE 30 MG/ML IJ SOLN
INTRAMUSCULAR | Status: AC
  Filled 2024-01-27: qty 1

## 2024-01-27 MED ORDER — CELECOXIB 200 MG PO CAPS
200.0000 mg | ORAL_CAPSULE | ORAL | Status: AC
Start: 2024-01-27 — End: 2024-01-27
  Administered 2024-01-27: 200 mg via ORAL
  Filled 2024-01-27: qty 1

## 2024-01-27 MED ORDER — ACETAMINOPHEN 500 MG PO TABS
1000.0000 mg | ORAL_TABLET | ORAL | Status: AC
  Administered 2024-01-27: 1000 mg via ORAL
  Filled 2024-01-27: qty 2

## 2024-01-27 MED ORDER — PROPOFOL 10 MG/ML IV BOLUS
INTRAVENOUS | Status: DC | PRN
  Administered 2024-01-27: 20 mg via INTRAVENOUS
  Administered 2024-01-27: 10 mg via INTRAVENOUS

## 2024-01-27 MED ORDER — LIDOCAINE HCL (PF) 2 % IJ SOLN
INTRAMUSCULAR | Status: AC
  Filled 2024-01-27: qty 5

## 2024-01-27 MED ORDER — BUPIVACAINE-EPINEPHRINE (PF) 0.5% -1:200000 IJ SOLN
INTRAMUSCULAR | Status: AC
  Filled 2024-01-27: qty 30

## 2024-01-27 MED ORDER — ONDANSETRON HCL 4 MG/2ML IJ SOLN
INTRAMUSCULAR | Status: AC
  Filled 2024-01-27: qty 2

## 2024-01-27 MED ORDER — FENTANYL CITRATE (PF) 100 MCG/2ML IJ SOLN
INTRAMUSCULAR | Status: DC | PRN
  Administered 2024-01-27: 25 ug via INTRAVENOUS

## 2024-01-27 MED ORDER — SODIUM CHLORIDE 0.9% FLUSH: 3.0000 mL | Freq: Two times a day (BID) | INTRAVENOUS | Status: DC

## 2024-01-27 MED ORDER — 0.9 % SODIUM CHLORIDE (POUR BTL) OPTIME
TOPICAL | Status: DC | PRN
  Administered 2024-01-27: 1000 mL

## 2024-01-27 MED ORDER — MIDAZOLAM HCL 2 MG/2ML IJ SOLN
INTRAMUSCULAR | Status: AC
Start: 2024-01-27 — End: ?
  Filled 2024-01-27: qty 2

## 2024-01-27 MED ORDER — MICROFIBRILLAR COLL HEMOSTAT EX PADS
MEDICATED_PAD | CUTANEOUS | Status: DC | PRN
  Administered 2024-01-27: 1 via TOPICAL

## 2024-01-27 MED ORDER — PROPOFOL 10 MG/ML IV BOLUS
INTRAVENOUS | Status: AC
  Filled 2024-01-27: qty 20

## 2024-01-27 MED ORDER — LACTATED RINGERS IV SOLN: INTRAVENOUS | Status: DC

## 2024-01-27 MED ORDER — DOCUSATE SODIUM 100 MG PO CAPS: 100.0000 mg | ORAL_CAPSULE | Freq: Two times a day (BID) | ORAL | 0 refills | Status: DC

## 2024-01-27 MED ORDER — ONDANSETRON HCL 4 MG/2ML IJ SOLN
INTRAMUSCULAR | Status: DC | PRN
  Administered 2024-01-27: 4 mg via INTRAVENOUS

## 2024-01-27 MED ORDER — PROPOFOL 500 MG/50ML IV EMUL
INTRAVENOUS | Status: DC | PRN
  Administered 2024-01-27: 50 ug/kg/min via INTRAVENOUS

## 2024-01-27 MED ORDER — OXYCODONE HCL 5 MG PO TABS: 5.0000 mg | ORAL_TABLET | Freq: Four times a day (QID) | ORAL | 0 refills | Status: DC | PRN

## 2024-01-27 SURGICAL SUPPLY — 26 items
BAG COUNTER SPONGE SURGICOUNT (BAG) IMPLANT
BLADE SURG 15 STRL LF DISP TIS (BLADE) ×1 IMPLANT
BLADE SURG SZ10 CARB STEEL (BLADE) ×1 IMPLANT
DRAIN PENROSE 0.25X18 (DRAIN) IMPLANT
DRAPE LAPAROTOMY T 98X78 PEDS (DRAPES) ×1 IMPLANT
ELECT REM PT RETURN 15FT ADLT (MISCELLANEOUS) ×1 IMPLANT
GAUZE 4X4 16PLY ~~LOC~~+RFID DBL (SPONGE) ×1 IMPLANT
GAUZE PAD ABD 8X10 STRL (GAUZE/BANDAGES/DRESSINGS) IMPLANT
GAUZE SPONGE 4X4 12PLY STRL (GAUZE/BANDAGES/DRESSINGS) IMPLANT
GLOVE BIO SURGEON STRL SZ 6.5 (GLOVE) ×1 IMPLANT
GLOVE INDICATOR 6.5 STRL GRN (GLOVE) ×2 IMPLANT
GOWN STRL REUS W/ TWL XL LVL3 (GOWN DISPOSABLE) ×2 IMPLANT
KIT BASIN OR (CUSTOM PROCEDURE TRAY) ×1 IMPLANT
KIT TURNOVER KIT A (KITS) ×1 IMPLANT
PACK GENERAL/GYN (CUSTOM PROCEDURE TRAY) ×1 IMPLANT
PACK LITHOTOMY IV (CUSTOM PROCEDURE TRAY) ×1 IMPLANT
PENCIL SMOKE EVACUATOR (MISCELLANEOUS) IMPLANT
SET IRRIG Y TYPE TUR BLADDER L (SET/KITS/TRAYS/PACK) ×1 IMPLANT
SPONGE HEMORRHOID 8X3CM (HEMOSTASIS) IMPLANT
SURGILUBE 2OZ TUBE FLIPTOP (MISCELLANEOUS) ×1 IMPLANT
SUT CHROMIC 2 0 SH (SUTURE) ×1 IMPLANT
SUT CHROMIC 3 0 SH 27 (SUTURE) ×1 IMPLANT
SUT SILK 2-0 18XBRD TIE 12 (SUTURE) IMPLANT
SUT VICRYL 0 UR6 27IN ABS (SUTURE) IMPLANT
TOWEL OR 17X26 10 PK STRL BLUE (TOWEL DISPOSABLE) ×1 IMPLANT
WATER STERILE IRR 500ML POUR (IV SOLUTION) ×1 IMPLANT

## 2024-01-27 NOTE — Transfer of Care (Signed)
 Immediate Anesthesia Transfer of Care Note  Patient: Teresa Roberts  Procedure(s) Performed: HEMORRHOIDECTOMY  Patient Location: PACU  Anesthesia Type:MAC  Level of Consciousness: awake, alert , and oriented  Airway & Oxygen Therapy: Patient Spontanous Breathing and Patient connected to face mask oxygen  Post-op Assessment: Report given to RN, Post -op Vital signs reviewed and stable, and Patient moving all extremities X 4  Post vital signs: Reviewed and stable  Last Vitals:  Vitals Value Taken Time  BP 126/77 01/27/24 1417  Temp    Pulse 87 01/27/24 1418  Resp 21 01/27/24 1418  SpO2 100 % 01/27/24 1418  Vitals shown include unfiled device data.  Last Pain:  Vitals:   01/27/24 1149  TempSrc:   PainSc: 6          Complications: No notable events documented.

## 2024-01-27 NOTE — Discharge Instructions (Addendum)

## 2024-01-27 NOTE — Progress Notes (Signed)
 Discharge instructions were gone over with the patient, patient requested that I only go over instructions with her. Discharge packet with her information was sent home with the patient.

## 2024-01-27 NOTE — Op Note (Signed)
 01/27/2024  2:04 PM  PATIENT:  Teresa Roberts  63 y.o. female  Patient Care Team: Mila Palmer, MD as PCP - General (Family Medicine) Jodelle Red, MD as PCP - Cardiology (Cardiology) Carlean Jews, NP as Nurse Practitioner (Hematology and Oncology)  PRE-OPERATIVE DIAGNOSIS:  INTERNAL AND EXTERNAL BLEEDING HEMORRHOIDS  POST-OPERATIVE DIAGNOSIS:  INTERNAL AND EXTERNAL BLEEDING HEMORRHOIDS  PROCEDURE:  THREE COLUMN HEMORRHOIDECTOMY    Surgeon(s): [name], MD  ASSISTANT: none   ANESTHESIA:   local and MAC  SPECIMEN:  Source of Specimen:  R posterior, R anterior and L lateral hemorrhoid  DISPOSITION OF SPECIMEN:  PATHOLOGY  COUNTS:  YES  PLAN OF CARE: Discharge to home after PACU  PATIENT DISPOSITION:  PACU - hemodynamically stable.  INDICATION: 63 y.o. F with significant bleeding and pain from hemorrhoid disease   OR FINDINGS: Grade 2 and 3 hemorrhoids  DESCRIPTION: the patient was identified in the preoperative holding area and taken to the OR where they were laid on the operating room table.  MAC anesthesia was induced without difficulty. The patient was then positioned in prone jackknife position with buttocks gently taped apart.  The patient was then prepped and draped in usual sterile fashion.  SCDs were noted to be in place prior to the initiation of anesthesia. A surgical timeout was performed indicating the correct patient, procedure, positioning and need for preoperative antibiotics.  A rectal block was performed using Marcaine with epinephrine mixed with Experel.    I began with a digital rectal exam.  The anal canal was gently dilated.  I then placed a Hill-Ferguson anoscope into the anal canal and evaluated this completely.  The patient had grade 2 and 3 hemorrhoids with significant external components at all 3 locations.  I began in the right posterior.  The hemorrhoid was elevated using an Allis clamp and incised on the skin using a 10 blade  scalpel.  Dissection was carried down to the anal canal using blunt dissection and the Metzenbaum scissors.  I identified the sphincter complex and removed all the hemorrhoidal tissue anterior to this.  I continued down into the rectal mucosa.  This was then sent to pathology for further examination.  The proximal portion of the incision was closed using a running 2-0 chromic suture.  The area distal to the dentate line was closed with a 3-0 chromic suture.  Hemostasis was good.  We then turned our attention to the right anterior hemorrhoid.  This was resected in similar fashion.  This was also closed with 2-0 and 3-0 chromic sutures in a running fashion.  A portion of this incision on the anoderm was closed laterally to allow for less tension on the incision.  The left lateral hemorrhoid was then excised in similar fashion as well and closed with 2-0 and 3-0 chromic sutures.  I inspected the anal canal for bleeding.  There was an area of bleeding at the dentate line in the left lateral anal canal that was closed with a figure-of-eight 2-0 chromic suture.  An area of the anoderm in the right anterior hemorrhoidectomy site was also bleeding and this was closed with several interrupted 3-0 chromic sutures.  At this point a Gelfoam was placed into the anal canal for added postoperative hemostasis.  The remainder of the Exparel mixture was placed around the hemorrhoidectomy sites for postoperative pain control.  A dressing was applied the patient was awakened from anesthesia and sent to the postanesthesia care unit in stable condition.  All counts  were correct per operating room staff.  [name], MD  Colorectal and General Surgery [facility]

## 2024-01-27 NOTE — Anesthesia Procedure Notes (Signed)
 Procedure Name: MAC Date/Time: 01/27/2024 1:00 PM  Performed by: Nelle Don, CRNAPre-anesthesia Checklist: Patient identified, Emergency Drugs available, Suction available and Patient being monitored Oxygen Delivery Method: Simple face mask

## 2024-01-27 NOTE — H&P (Signed)
 REFERRING PHYSICIAN:  Emeterio Reeve, MD   PROVIDER:  Elenora Gamma, MD   MRN: [id number] DOB: [birth year]    Subjective    Chief Complaint: New Consultation       History of Present Illness: Teresa Roberts is a 63 y.o. female who is seen today as an office consultation at the request of Dr. Paulino Rily for evaluation of New Consultation .  Patient is states she is having worsening bleeding from her hemorrhoids.  She states that she occasionally has flares of significant bleeding and pain.  She reports regular bowel habits and denies any straining or sitting on the toilet for long periods of time.  She has never had any hemorrhoid surgery before.     Review of Systems: A complete review of systems was obtained from the patient.  I have reviewed this information and discussed as appropriate with the patient.  See HPI as well for other ROS.     Medical History:     Past Medical History:  Diagnosis Date   COPD (chronic obstructive pulmonary disease) (CMS/HHS-HCC)     Glaucoma (increased eye pressure)           Patient Active Problem List  Diagnosis   COPD (chronic obstructive pulmonary disease) (CMS/HHS-HCC)   Chronic diastolic (congestive) heart failure (CMS/HHS-HCC)   Coronary artery disease involving native coronary artery of native heart without angina pectoris   Essential hypertension   Fibromyalgia   Hypoxia   OSA (obstructive sleep apnea)   Pulmonary hypertension (CMS/HHS-HCC)   Type 2 myocardial infarction without ST elevation (CMS/HHS-HCC)   Bleeding hemorrhoids           Past Surgical History:  Procedure Laterality Date   Ankle surgery              Allergies  Allergen Reactions   Lisinopril Cough            Current Outpatient Medications on File Prior to Visit  Medication Sig Dispense Refill   albuterol (PROVENTIL) 2.5 mg /3 mL (0.083 %) nebulizer solution Inhale 2.5 mg into the lungs every 6 (six) hours as needed       albuterol MDI, PROVENTIL,  VENTOLIN, PROAIR, HFA 90 mcg/actuation inhaler Inhale 2 Inhalations into the lungs every 6 (six) hours as needed       alendronate (FOSAMAX) 70 MG tablet Take 70 mg by mouth       ARIPiprazole (ABILIFY) 5 MG tablet Take 1/2 tab for I week and than full tab daily       aspirin 81 MG EC tablet Take 81 mg by mouth once daily       busPIRone (BUSPAR) 10 MG tablet Take 10 mg by mouth 3 (three) times daily       cyanocobalamin (VITAMIN B12) 1000 MCG tablet 1 tablet Orally Once a day for 90 days       cyclobenzaprine (FLEXERIL) 10 MG tablet 1 tablet Orally three times a day as needed for 90 days       dorzolamide-timoloL (COSOPT) 22.3-6.8 mg/mL ophthalmic solution Apply 1 drop to eye 2 (two) times daily       DULoxetine (CYMBALTA) 60 MG DR capsule Take 1 capsule by mouth once daily       ergocalciferol, vitamin D2, 1,250 mcg (50,000 unit) capsule Take 50,000 Units by mouth every 7 (seven) days       esomeprazole (NEXIUM) 40 MG DR capsule Take 40 mg by mouth       folic  acid (FOLVITE) 1 MG tablet 1 tablet Orally Once a day for 90 days       FUROsemide (LASIX) 40 MG tablet Take 40 mg by mouth once daily       gabapentin (NEURONTIN) 800 MG tablet Take 800 mg by mouth 2 (two) times daily       metoprolol SUCCinate (TOPROL-XL) 25 MG XL tablet Take 1 tablet by mouth once daily       pregabalin (LYRICA) 75 MG capsule Take 75 mg by mouth 2 (two) times daily       rizatriptan (MAXALT-MLT) 10 MG disintegrating tablet Take 10 mg by mouth once daily as needed       simvastatin (ZOCOR) 10 MG tablet Take 10 mg by mouth at bedtime       traZODone (DESYREL) 150 MG tablet Take 150 mg by mouth at bedtime       TRELEGY ELLIPTA 100-62.5-25 mcg inhaler Inhale 1 Inhalation into the lungs once daily        No current facility-administered medications on file prior to visit.      History reviewed. No pertinent family history.    Social History        Tobacco Use  Smoking Status Every Day   Current packs/day: 0.50    Types: Cigarettes  Smokeless Tobacco Never      Social History         Socioeconomic History   Marital status: Married  Tobacco Use   Smoking status: Every Day      Current packs/day: 0.50      Types: Cigarettes   Smokeless tobacco: Never  Substance and Sexual Activity   Alcohol use: Not Currently   Drug use: Yes      Objective:      Vitals:   01/27/24 1059  BP: (!) (P) 134/97  Pulse: (!) (P) 102  Resp: (P) 17  Temp: (P) 98.6 F (37 C)  SpO2: (P) 100%     Exam Gen: NAD CV: RRR Pulm: CTA Abd: soft Rectal: Circumferential skin tags with some mild excoriation externally in the right posterior area.     Labs, Imaging and Diagnostic Testing:   Procedure: Anoscopy (10/24) Surgeon: Maisie Fus After the risks and benefits were explained, written consent was obtained for above procedure.  A medical assistant chaperone was present thoroughout the entire procedure.  Anesthesia: none Diagnosis: hemorrhoids Findings: Grade 1 left lateral, grade 2 right anterior, grade 3 right posterior with excoriation externally as well.     Assessment and Plan:  Diagnoses and all orders for this visit:   Internal and external bleeding hemorrhoids       63 year old female with multiple medical problems and active smoker who presents to the office for evaluation of hemorrhoids.  She states that she is having regular bowel function but continuing to have episodes of inflammation and bleeding.  We discussed the option of trans hemorrhoidal dearterialization which would help with her bleeding.  We also discussed a full hemorrhoidectomy to help with her external symptoms as well.  We discussed that with the full hemorrhoidectomy she could have significant pain for several months after surgery.  We discussed that she cannot be on opiates for that long and will only be prescribed these for the week after surgery.  We discussed the need for ongoing control of her bowel habits during recovery.  There  is also a small risk of stricture and recurrence.  After discussing this in detail, the patient has elected to  proceed with a full hemorrhoidectomy.  I believe she will need cardiac and pulmonary clearance prior to anesthesia.   Teresa Panda, MD Colon and Rectal Surgery [facility] Surgery

## 2024-01-29 NOTE — Anesthesia Postprocedure Evaluation (Signed)
 Anesthesia Post Note  Patient: Teresa Roberts  Procedure(s) Performed: HEMORRHOIDECTOMY     Patient location during evaluation: PACU Anesthesia Type: General Level of consciousness: awake and alert Pain management: pain level controlled Vital Signs Assessment: post-procedure vital signs reviewed and stable Respiratory status: spontaneous breathing, nonlabored ventilation, respiratory function stable and patient connected to nasal cannula oxygen Cardiovascular status: blood pressure returned to baseline and stable Postop Assessment: no apparent nausea or vomiting Anesthetic complications: no   No notable events documented.  Last Vitals:  Vitals:   01/27/24 1430 01/27/24 1445  BP: 125/76 122/89  Pulse: 80 81  Resp: 20 18  Temp:  36.4 C  SpO2: 100% 100%    Last Pain:  Vitals:   01/27/24 1445  TempSrc:   PainSc: 0-No pain                 Collene Schlichter

## 2024-01-30 ENCOUNTER — Encounter (HOSPITAL_COMMUNITY): Payer: Self-pay | Admitting: General Surgery

## 2024-01-30 ENCOUNTER — Other Ambulatory Visit: Payer: Medicare Other

## 2024-01-30 LAB — SURGICAL PATHOLOGY

## 2024-02-01 ENCOUNTER — Inpatient Hospital Stay: Admission: RE | Admit: 2024-02-01 | Source: Ambulatory Visit

## 2024-02-16 ENCOUNTER — Other Ambulatory Visit (HOSPITAL_BASED_OUTPATIENT_CLINIC_OR_DEPARTMENT_OTHER): Payer: Self-pay | Admitting: Family

## 2024-04-05 NOTE — Progress Notes (Deleted)
 Office Visit Note  Patient: Teresa Roberts             Date of Birth: 1961-09-03           MRN: [id number]             PCP: Olin Bertin, MD Referring: Olin Bertin, MD Visit Date: 04/19/2024 Occupation: @GUAROCC @  Subjective:    History of Present Illness: Tynesia Harral is a 63 y.o. female with history of fibromyalgia and osteoporosis.    Fosamax    Trigger point injections on 01/16/24   Activities of Daily Living:  Patient reports morning stiffness for *** {minute/hour:19697}.   Patient {ACTIONS;DENIES/REPORTS:21021675::"Denies"} nocturnal pain.  Difficulty dressing/grooming: {ACTIONS;DENIES/REPORTS:21021675::"Denies"} Difficulty climbing stairs: {ACTIONS;DENIES/REPORTS:21021675::"Denies"} Difficulty getting out of chair: {ACTIONS;DENIES/REPORTS:21021675::"Denies"} Difficulty using hands for taps, buttons, cutlery, and/or writing: {ACTIONS;DENIES/REPORTS:21021675::"Denies"}  No Rheumatology ROS completed.   PMFS History:  Patient Active Problem List   Diagnosis Date Noted   Respiratory syncytial virus (RSV) infection 11/18/2023   Acute and chronic respiratory failure (acute-on-chronic) (HCC) 11/17/2023   B12 deficiency 09/13/2023   Sciatica of left side associated with disorder of lumbar spine 09/13/2022   Chronic pain syndrome 09/13/2022   History of colonic polyps 01/01/2021   Esophageal reflux 01/01/2021   Hypercalcemia 09/23/2020   Hyperparathyroidism, primary (HCC) 08/25/2020   Iron  deficiency anemia 06/27/2019   Coronary artery disease involving native coronary artery of native heart without angina pectoris 09/05/2018   Pain in joint of left elbow 09/04/2018   HLD (hyperlipidemia) 05/27/2018   Essential hypertension 05/27/2018   Depression 05/27/2018   OSA (obstructive sleep apnea) 05/03/2018   Pulmonary hypertension (HCC) 05/03/2018   Microcytic anemia 10/19/2017   Chronic diastolic (congestive) heart failure (HCC) 08/09/2017   Other fatigue 06/13/2017    Primary insomnia 06/13/2017   History of migraine 06/13/2017   COPD with acute exacerbation (HCC) 02/15/2017   Tobacco abuse 02/15/2017   Chronic respiratory failure with hypoxia, on home O2 therapy (HCC) - on 6-8 liters/min 01/28/2017   Migraine 01/28/2017   Daily headache    MGUS (monoclonal gammopathy of unknown significance) 01/11/2012   Fibromyalgia 01/11/2012   Bruises easily 01/11/2012    Past Medical History:  Diagnosis Date   Arthritis    Carpal tunnel syndrome    COPD (chronic obstructive pulmonary disease) (HCC)    O2 dependent   Depression    PTSD   Fibromyalgia    Hyperlipidemia    Hypertension    off of x 4 years   Migraines    Osteoporosis    Sciatic nerve disease     Family History  Problem Relation Age of Onset   Diabetes Mother    Hypertension Mother    CAD Mother    Diabetes Father    Lung cancer Father    Diabetes Sister    Hypercalcemia Neg Hx    Breast cancer Neg Hx    Past Surgical History:  Procedure Laterality Date   APPENDECTOMY     BIOPSY  01/01/2021   Procedure: BIOPSY;  Surgeon: Baldo Bonds, MD;  Location: [facility];  Service: Endoscopy;;   CESAREAN SECTION     COLONOSCOPY WITH PROPOFOL  N/A 01/01/2021   Procedure: COLONOSCOPY WITH PROPOFOL ;  Surgeon: Baldo Bonds, MD;  [facility]: [facility];  Service: Endoscopy;  Laterality: N/A;   ESOPHAGOGASTRODUODENOSCOPY (EGD) WITH PROPOFOL  N/A 01/01/2021   Procedure: ESOPHAGOGASTRODUODENOSCOPY (EGD) WITH PROPOFOL ;  Surgeon: Baldo Bonds, MD;  [facility]: [facility];  Service: Endoscopy;  Laterality: N/A;   HEMORRHOID SURGERY N/A 01/27/2024  Procedure: HEMORRHOIDECTOMY;  Surgeon: Joyce Nixon, MD;  Location: [facility];  Service: General;  Laterality: N/A;   ORIF ANKLE FRACTURE Left 08/12/2017   Procedure: OPEN REDUCTION INTERNAL FIXATION (ORIF) ANKLE FRACTURE;  Surgeon: Adonica Hoose, MD;  Location: [facility];  Service: Orthopedics;  Laterality: Left;   RIGHT HEART CATH N/A 07/19/2018    Procedure: RIGHT HEART CATH;  Surgeon: Mardell Shade, MD;  [facility];  Service: Cardiovascular;  Laterality: N/A;   RIGHT/LEFT HEART CATH AND CORONARY ANGIOGRAPHY N/A 01/27/2017   Procedure: Right/Left Heart Cath and Coronary Angiography;  Surgeon: Millicent Ally, MD;  [facility];  Service: Cardiovascular;  Laterality: N/A;   Social History   Social History Narrative   Not on file   Immunization History  Administered Date(s) Administered   Influenza Split 08/15/2017, 08/28/2018, 08/24/2019, 08/16/2023   Influenza,inj,Quad PF,6+ Mos 12/28/2016, 09/28/2017, 09/04/2018, 08/03/2019, 08/29/2020, 08/03/2021, 08/11/2022   Influenza,inj,quad, With Preservative 08/09/2014   Influenza-Unspecified 08/15/2016   Moderna Sars-Covid-2 Vaccination 02/11/2020, 03/10/2020, 07/11/2020   Pneumococcal Polysaccharide-23 02/04/2014, 08/31/2016   Pneumococcal-Unspecified 02/13/2013   Respiratory Syncytial Virus Vaccine ,Recomb Aduvanted(Arexvy ) 08/11/2022   Tdap 04/25/2013, 02/04/2014   Zoster, Live 05/31/2019, 08/03/2019     Objective: Vital Signs: LMP 08/19/2012    Physical Exam Vitals and nursing note reviewed.  Constitutional:      Appearance: She is well-developed.  HENT:     Head: Normocephalic and atraumatic.  Eyes:     Conjunctiva/sclera: Conjunctivae normal.  Cardiovascular:     Rate and Rhythm: Normal rate and regular rhythm.     Heart sounds: Normal heart sounds.  Pulmonary:     Effort: Pulmonary effort is normal.     Breath sounds: Normal breath sounds.  Abdominal:     General: Bowel sounds are normal.     Palpations: Abdomen is soft.  Musculoskeletal:     Cervical back: Normal range of motion.  Lymphadenopathy:     Cervical: No cervical adenopathy.  Skin:    General: Skin is warm and dry.     Capillary Refill: Capillary refill takes less than 2 seconds.  Neurological:     Mental Status: She is alert and oriented to person, place, and  time.  Psychiatric:        Behavior: Behavior normal.      Musculoskeletal Exam: ***  CDAI Exam: CDAI Score: -- Patient Global: --; Provider Global: -- Swollen: --; Tender: -- Joint Exam 04/19/2024   No joint exam has been documented for this visit   There is currently no information documented on the homunculus. Go to the Rheumatology activity and complete the homunculus joint exam.  Investigation: No additional findings.  Imaging: No results found.  Recent Labs: Lab Results  Component Value Date   WBC 10.1 11/18/2023   HGB 9.8 (L) 11/18/2023   PLT 172 11/18/2023   NA 139 11/18/2023   K 4.5 11/18/2023   CL 101 11/18/2023   CO2 28 11/18/2023   GLUCOSE 128 (H) 11/18/2023   BUN 13 11/18/2023   CREATININE 0.79 11/18/2023   BILITOT 0.3 11/17/2023   ALKPHOS 95 11/17/2023   AST 22 11/17/2023   ALT 15 11/17/2023   PROT 7.1 11/17/2023   ALBUMIN 3.5 11/17/2023   CALCIUM  9.6 11/18/2023   GFRAA >60 08/27/2019    Speciality Comments: No specialty comments available.  Procedures:  No procedures performed Allergies: Citrus and Lisinopril   Assessment / Plan:     Visit Diagnoses: Fibromyalgia  Other fatigue  Trapezius muscle spasm  Primary insomnia  Bilateral carpal tunnel syndrome  Trochanteric bursitis of both hips  Chronic left-sided low back pain without sciatica  Age-related osteoporosis without current pathological fracture  Vitamin D  deficiency  Pulmonary hypertension (HCC)  History of COPD  Coronary artery disease involving native coronary artery of native heart without angina pectoris  Essential hypertension  Chronic diastolic (congestive) heart failure (HCC)  Gastroesophageal reflux disease, unspecified whether esophagitis present  History of migraine  MGUS (monoclonal gammopathy of unknown significance)  Anxiety  Former smoker  Orders: No orders of the defined types were placed in this encounter.  No orders of the defined  types were placed in this encounter.   Face-to-face time spent with patient was *** minutes. Greater than 50% of time was spent in counseling and coordination of care.  Follow-Up Instructions: No follow-ups on file.   Romayne Clubs, PA-C  Note - This record has been created using Dragon software.  Chart creation errors have been sought, but may not always  have been located. Such creation errors do not reflect on  the standard of medical care.

## 2024-04-12 ENCOUNTER — Telehealth: Payer: Self-pay | Admitting: Primary Care

## 2024-04-12 NOTE — Telephone Encounter (Signed)
 Rc'd Stationary O2 fax from [place] for records and Dr. Allean [place]. Fwd to Ms. Sueanne Emerald.

## 2024-04-17 NOTE — Telephone Encounter (Signed)
 [facility] faxed successfully and signed.

## 2024-04-19 ENCOUNTER — Ambulatory Visit: Admitting: Physician Assistant

## 2024-04-19 ENCOUNTER — Other Ambulatory Visit: Payer: Self-pay | Admitting: Physical Medicine and Rehabilitation

## 2024-04-19 DIAGNOSIS — G5603 Carpal tunnel syndrome, bilateral upper limbs: Secondary | ICD-10-CM

## 2024-04-19 DIAGNOSIS — I10 Essential (primary) hypertension: Secondary | ICD-10-CM

## 2024-04-19 DIAGNOSIS — I272 Pulmonary hypertension, unspecified: Secondary | ICD-10-CM

## 2024-04-19 DIAGNOSIS — Z8669 Personal history of other diseases of the nervous system and sense organs: Secondary | ICD-10-CM

## 2024-04-19 DIAGNOSIS — Z8709 Personal history of other diseases of the respiratory system: Secondary | ICD-10-CM

## 2024-04-19 DIAGNOSIS — E559 Vitamin D deficiency, unspecified: Secondary | ICD-10-CM

## 2024-04-19 DIAGNOSIS — Z87891 Personal history of nicotine dependence: Secondary | ICD-10-CM

## 2024-04-19 DIAGNOSIS — M797 Fibromyalgia: Secondary | ICD-10-CM

## 2024-04-19 DIAGNOSIS — M810 Age-related osteoporosis without current pathological fracture: Secondary | ICD-10-CM

## 2024-04-19 DIAGNOSIS — I5032 Chronic diastolic (congestive) heart failure: Secondary | ICD-10-CM

## 2024-04-19 DIAGNOSIS — R5383 Other fatigue: Secondary | ICD-10-CM

## 2024-04-19 DIAGNOSIS — F419 Anxiety disorder, unspecified: Secondary | ICD-10-CM

## 2024-04-19 DIAGNOSIS — D472 Monoclonal gammopathy: Secondary | ICD-10-CM

## 2024-04-19 DIAGNOSIS — M62838 Other muscle spasm: Secondary | ICD-10-CM

## 2024-04-19 DIAGNOSIS — M545 Low back pain, unspecified: Secondary | ICD-10-CM

## 2024-04-19 DIAGNOSIS — K219 Gastro-esophageal reflux disease without esophagitis: Secondary | ICD-10-CM

## 2024-04-19 DIAGNOSIS — M7061 Trochanteric bursitis, right hip: Secondary | ICD-10-CM

## 2024-04-19 DIAGNOSIS — I2510 Atherosclerotic heart disease of native coronary artery without angina pectoris: Secondary | ICD-10-CM

## 2024-04-19 DIAGNOSIS — F5101 Primary insomnia: Secondary | ICD-10-CM

## 2024-04-25 DIAGNOSIS — G8929 Other chronic pain: Secondary | ICD-10-CM | POA: Diagnosis not present

## 2024-04-25 DIAGNOSIS — J449 Chronic obstructive pulmonary disease, unspecified: Secondary | ICD-10-CM | POA: Diagnosis not present

## 2024-05-02 DIAGNOSIS — M5416 Radiculopathy, lumbar region: Secondary | ICD-10-CM | POA: Diagnosis not present

## 2024-05-14 DIAGNOSIS — I50813 Acute on chronic right heart failure: Secondary | ICD-10-CM | POA: Diagnosis not present

## 2024-05-14 DIAGNOSIS — I2510 Atherosclerotic heart disease of native coronary artery without angina pectoris: Secondary | ICD-10-CM | POA: Diagnosis not present

## 2024-05-14 DIAGNOSIS — M810 Age-related osteoporosis without current pathological fracture: Secondary | ICD-10-CM | POA: Diagnosis not present

## 2024-05-14 DIAGNOSIS — J449 Chronic obstructive pulmonary disease, unspecified: Secondary | ICD-10-CM | POA: Diagnosis not present

## 2024-06-14 DIAGNOSIS — M810 Age-related osteoporosis without current pathological fracture: Secondary | ICD-10-CM | POA: Diagnosis not present

## 2024-06-14 DIAGNOSIS — I2510 Atherosclerotic heart disease of native coronary artery without angina pectoris: Secondary | ICD-10-CM | POA: Diagnosis not present

## 2024-06-14 DIAGNOSIS — J449 Chronic obstructive pulmonary disease, unspecified: Secondary | ICD-10-CM | POA: Diagnosis not present

## 2024-06-14 DIAGNOSIS — I50813 Acute on chronic right heart failure: Secondary | ICD-10-CM | POA: Diagnosis not present

## 2024-07-15 DIAGNOSIS — I2510 Atherosclerotic heart disease of native coronary artery without angina pectoris: Secondary | ICD-10-CM | POA: Diagnosis not present

## 2024-07-15 DIAGNOSIS — J449 Chronic obstructive pulmonary disease, unspecified: Secondary | ICD-10-CM | POA: Diagnosis not present

## 2024-07-15 DIAGNOSIS — M810 Age-related osteoporosis without current pathological fracture: Secondary | ICD-10-CM | POA: Diagnosis not present

## 2024-07-15 DIAGNOSIS — I50813 Acute on chronic right heart failure: Secondary | ICD-10-CM | POA: Diagnosis not present

## 2024-07-21 ENCOUNTER — Other Ambulatory Visit: Payer: Self-pay | Admitting: Primary Care

## 2024-07-23 ENCOUNTER — Other Ambulatory Visit: Payer: Self-pay | Admitting: *Deleted

## 2024-08-08 NOTE — Progress Notes (Signed)
 Office Visit Note  Patient: Teresa Roberts             Date of Birth: [birth year]           MRN: [id number]             PCP: Verena Mems, MD Referring: Verena Mems, MD Visit Date: 08/22/2024 Occupation: Data Unavailable  Subjective:  Trapezius muscle tension and tenderness   History of Present Illness: Teresa Roberts is a 63 y.o. female with history of fibromyalgia.  Patient presents today with trapezius muscle tension and tenderness bilaterally.  She has been experiencing muscle spasms intermittently.  Patient had trigger point injections performed on 01/16/2024 which rated significant relief for at least 6 weeks.  Patient states that her symptoms have recurred and she has requested repeat trigger point injections today.  Patient states that she takes tizanidine  4 mg at bedtime as needed for muscle spasms.  She remains on Lyrica  as prescribed.  She has been taking trazodone  at bedtime for insomnia. Patient is due for bone density in December 2025.  Patient states that she has not been taking Fosamax .  She states that she has been taking vitamin D  50,000 units once weekly.      Activities of Daily Living:  Patient reports morning stiffness for several hours.   Patient Reports nocturnal pain.  Difficulty dressing/grooming: Reports Difficulty climbing stairs: Reports Difficulty getting out of chair: Reports Difficulty using hands for taps, buttons, cutlery, and/or writing: Reports  Review of Systems  Constitutional:  Negative for fatigue.  HENT:  Positive for mouth dryness. Negative for mouth sores.   Eyes:  Positive for dryness.  Respiratory:  Positive for shortness of breath and wheezing.   Cardiovascular:  Negative for chest pain and palpitations.  Gastrointestinal:  Negative for blood in stool, constipation and diarrhea.  Endocrine: Negative for increased urination.  Genitourinary:  Negative for involuntary urination.  Musculoskeletal:  Positive for joint pain, gait problem,  joint pain, myalgias, muscle weakness, morning stiffness, muscle tenderness and myalgias. Negative for joint swelling.  Skin:  Negative for color change, rash, hair loss and sensitivity to sunlight.  Allergic/Immunologic: Negative for susceptible to infections.  Neurological:  Positive for dizziness and headaches.  Hematological:  Negative for swollen glands.  Psychiatric/Behavioral:  Positive for depressed mood and sleep disturbance. The patient is nervous/anxious.     PMFS History:  Patient Active Problem List   Diagnosis Date Noted   Respiratory syncytial virus (RSV) infection 11/18/2023   Acute and chronic respiratory failure (acute-on-chronic) (HCC) 11/17/2023   B12 deficiency 09/13/2023   Sciatica of left side associated with disorder of lumbar spine 09/13/2022   Chronic pain syndrome 09/13/2022   History of colonic polyps 01/01/2021   Esophageal reflux 01/01/2021   Hypercalcemia 09/23/2020   Hyperparathyroidism, primary 08/25/2020   Iron  deficiency anemia 06/27/2019   Coronary artery disease involving native coronary artery of native heart without angina pectoris 09/05/2018   Pain in joint of left elbow 09/04/2018   HLD (hyperlipidemia) 05/27/2018   Essential hypertension 05/27/2018   Depression 05/27/2018   OSA (obstructive sleep apnea) 05/03/2018   Pulmonary hypertension (HCC) 05/03/2018   Microcytic anemia 10/19/2017   Chronic diastolic (congestive) heart failure (HCC) 08/09/2017   Other fatigue 06/13/2017   Primary insomnia 06/13/2017   History of migraine 06/13/2017   COPD with acute exacerbation (HCC) 02/15/2017   Tobacco abuse 02/15/2017   Chronic respiratory failure with hypoxia, on home O2 therapy (HCC) - on 6-8 liters/min 01/28/2017  Migraine 01/28/2017   Daily headache    MGUS (monoclonal gammopathy of unknown significance) 01/11/2012   Fibromyalgia 01/11/2012   Bruises easily 01/11/2012    Past Medical History:  Diagnosis Date   Arthritis    Carpal  tunnel syndrome    COPD (chronic obstructive pulmonary disease) (HCC)    O2 dependent   Depression    PTSD   Fibromyalgia    Hyperlipidemia    Hypertension    off of x 4 years   Migraines    Osteoporosis    Sciatic nerve disease     Family History  Problem Relation Age of Onset   Diabetes Mother    Hypertension Mother    CAD Mother    Diabetes Father    Lung cancer Father    Diabetes Sister    Hypercalcemia Neg Hx    Breast cancer Neg Hx    Past Surgical History:  Procedure Laterality Date   APPENDECTOMY     BIOPSY  01/01/2021   Procedure: BIOPSY;  Surgeon: Dianna Specking, MD;  Location: [facility];  Service: Endoscopy;;   CESAREAN SECTION     COLONOSCOPY WITH PROPOFOL  N/A 01/01/2021   Procedure: COLONOSCOPY WITH PROPOFOL ;  Surgeon: Dianna Specking, MD;  [facility]: [facility];  Service: Endoscopy;  Laterality: N/A;   ESOPHAGOGASTRODUODENOSCOPY (EGD) WITH PROPOFOL  N/A 01/01/2021   Procedure: ESOPHAGOGASTRODUODENOSCOPY (EGD) WITH PROPOFOL ;  Surgeon: Dianna Specking, MD;  [facility];  Service: Endoscopy;  Laterality: N/A;   HEMORRHOID SURGERY N/A 01/27/2024   Procedure: HEMORRHOIDECTOMY;  Surgeon: Debby Hila, MD;  Location: [facility];  Service: General;  Laterality: N/A;   ORIF ANKLE FRACTURE Left 08/12/2017   Procedure: OPEN REDUCTION INTERNAL FIXATION (ORIF) ANKLE FRACTURE;  Surgeon: Fidel Rogue, MD;  [facility];  Service: Orthopedics;  Laterality: Left;   RIGHT HEART CATH N/A 07/19/2018   Procedure: RIGHT HEART CATH;  Surgeon: Cherrie Toribio SAUNDERS, MD;  [facility];  Service: Cardiovascular;  Laterality: N/A;   RIGHT/LEFT HEART CATH AND CORONARY ANGIOGRAPHY N/A 01/27/2017   Procedure: Right/Left Heart Cath and Coronary Angiography;  Surgeon: Debby DELENA Sor, MD;  [facility];  Service: Cardiovascular;  Laterality: N/A;   Social History   Tobacco Use   Smoking status: Some Days    Current packs/day: 0.50    Types:  Cigarettes    Passive exposure: Never   Smokeless tobacco: Never  Vaping Use   Vaping status: Never Used  Substance Use Topics   Alcohol  use: No   Drug use: Not Currently    Types: Marijuana   Social History   Social History Narrative   Not on file     Immunization History  Administered Date(s) Administered   Influenza Split 08/15/2017, 08/28/2018, 08/24/2019, 08/16/2023   Influenza,inj,Quad PF,6+ Mos 12/28/2016, 09/28/2017, 09/04/2018, 08/03/2019, 08/29/2020, 08/03/2021, 08/11/2022   Influenza,inj,quad, With Preservative 08/09/2014   Influenza-Unspecified 08/15/2016   Moderna Sars-Covid-2 Vaccination 02/11/2020, 03/10/2020, 07/11/2020   Pneumococcal Polysaccharide-23 02/04/2014, 08/31/2016   Pneumococcal-Unspecified 02/13/2013   Respiratory Syncytial Virus Vaccine ,Recomb Aduvanted(Arexvy ) 08/11/2022   Tdap 04/25/2013, 02/04/2014   Zoster, Live 05/31/2019, 08/03/2019     Objective: Vital Signs: BP 127/88   Pulse 91   Temp (!) 97.4 F (36.3 C)   Resp 18   Ht 5' 6 (1.676 m)   Wt 179 lb 12.8 oz (81.6 kg)   LMP 08/19/2012   BMI 29.02 kg/m    Physical Exam Vitals and nursing note reviewed.  Constitutional:  Appearance: She is well-developed.  HENT:     Head: Normocephalic and atraumatic.  Eyes:     Conjunctiva/sclera: Conjunctivae normal.  Cardiovascular:     Rate and Rhythm: Normal rate and regular rhythm.     Heart sounds: Normal heart sounds.  Pulmonary:     Effort: Pulmonary effort is normal.     Breath sounds: Normal breath sounds.  Abdominal:     General: Bowel sounds are normal.     Palpations: Abdomen is soft.  Musculoskeletal:     Cervical back: Normal range of motion.  Lymphadenopathy:     Cervical: No cervical adenopathy.  Skin:    General: Skin is warm and dry.     Capillary Refill: Capillary refill takes less than 2 seconds.  Neurological:     Mental Status: She is alert and oriented to person, place, and time.  Psychiatric:         Behavior: Behavior normal.      Musculoskeletal Exam: Generalized hyperalgesia and positive tender points on exam.  C-spine has limited range of motion without rotation.  Trapezius muscle tension and tenderness bilaterally.  Shoulder joint abduction to about 90 degrees.  Elbow joints, wrist joints, MCPs, PIPs, DIPs have good range of motion with no synovitis.  Complete fist formation bilaterally.  Knee joints have good range of motion no warmth or effusion.  Ankle joints have good range of motion with no tenderness or joint swelling.  CDAI Exam: CDAI Score: -- Patient Global: --; Provider Global: -- Swollen: --; Tender: -- Joint Exam 08/22/2024   No joint exam has been documented for this visit   There is currently no information documented on the homunculus. Go to the Rheumatology activity and complete the homunculus joint exam.  Investigation: No additional findings.  Imaging: No results found.  Recent Labs: Lab Results  Component Value Date   WBC 10.1 11/18/2023   HGB 9.8 (L) 11/18/2023   PLT 172 11/18/2023   NA 139 11/18/2023   K 4.5 11/18/2023   CL 101 11/18/2023   CO2 28 11/18/2023   GLUCOSE 128 (H) 11/18/2023   BUN 13 11/18/2023   CREATININE 0.79 11/18/2023   BILITOT 0.3 11/17/2023   ALKPHOS 95 11/17/2023   AST 22 11/17/2023   ALT 15 11/17/2023   PROT 7.1 11/17/2023   ALBUMIN 3.5 11/17/2023   CALCIUM  9.6 11/18/2023   GFRAA >60 08/27/2019    Speciality Comments: No specialty comments available.  Procedures:  Trigger Point Inj  Date/Time: 08/22/2024 2:05 PM  Performed by: Cheryl Waddell HERO, PA-C Authorized by: Cheryl Waddell HERO, PA-C   Consent Given by:  Patient Site marked: the procedure site was marked   Timeout: prior to procedure the correct patient, procedure, and site was verified   Indications:  Pain Total # of Trigger Points:  2 Location: neck   Needle Size:  27 G Approach:  Dorsal Medications #1:  0.5 mL lidocaine  1 %; 10 mg triamcinolone  acetonide  40 MG/ML Medications #2:  0.5 mL lidocaine  1 %; 10 mg triamcinolone  acetonide 40 MG/ML Patient tolerance:  Patient tolerated the procedure well with no immediate complications  Allergies: Citrus and Lisinopril   Assessment / Plan:     Visit Diagnoses: Fibromyalgia - She remains on Lyrica , gabapentin , and Cymbalta  as prescribed.  She takes tizanidine  as needed for muscle spasms. She presents today with trapezius muscle tension and tenderness bilaterally.  Trigger point injections were performed bilaterally.  She has generalized hyperalgesia and positive tender points on  examination today.  Strongly encouraged the patient to continue the current treatment regimen.  Discussed the importance of regular exercise and good sleep hygiene. She will follow up in 6 months or sooner if needed.   Other fatigue - Chronic, stable.  Trapezius muscle spasm - S/P Bilateral Trapezius trigger point inj on 01/16/2024.  Patient presents today with trapezius muscle tension and tenderness bilaterally.  She is been experiencing muscle spasms intermittently.  She takes tizanidine  4 mg at bedtime as needed for muscle spasms.  She requested repeat trigger point injections today.  She tolerated procedures well.  Procedure notes were completed above.  Aftercare was discussed.  She was advised to notify us  if she develops a recurrence of symptoms.- Plan: Trigger Point Inj  Primary insomnia -She takes trazodone  150 mg by mouth at bedtime for insomnia.  Bilateral carpal tunnel syndrome - Bilateral mild carpal tunnel syndrome was noted on the nerve conduction velocities 2023 by Dr. Eldonna.  Trochanteric bursitis of both hips: Intermittent discomfort.  Chronic left-sided low back pain without sciatica - Chronic pain.  She remains on Lyrica  as prescribed.  Age-related osteoporosis without current pathological fracture - 10/20/22 DEXA scan BMD 0.492, T-score -3.4 left forearm radius 33%.  Discussed that the patient is due for an  updated DEXA in December 2025.  Patient reports that she has not been taking Fosamax  and is unsure if she ever did.  Patient has been taking vitamin D  50,000 units once weekly.  Strongly recommended for the patient to have an updated bone density because we will need to discuss treatment options going forward.  Discouraged the use of frequent steroids which can worsen her bone density.  Vitamin D  deficiency: She is taking vitamin D  50,000 units once weekly.  Other medical conditions are listed as follows:  History of migraine  Former smoker  History of COPD  Anxiety  Gastroesophageal reflux disease, unspecified whether esophagitis present  Chronic diastolic (congestive) heart failure (HCC)  Pulmonary hypertension (HCC)  Essential hypertension: Blood pressure is 127/88 today in the office.  Coronary artery disease involving native coronary artery of native heart without angina pectoris  MGUS (monoclonal gammopathy of unknown significance)  Orders: Orders Placed This Encounter  Procedures   Trigger Point Inj   No orders of the defined types were placed in this encounter.   Follow-Up Instructions: Return in about 6 months (around 02/20/2025) for Fibromyalgia.   Waddell CHRISTELLA Craze, PA-C  Note - This record has been created using Dragon software.  Chart creation errors have been sought, but may not always  have been located. Such creation errors do not reflect on  the standard of medical care.

## 2024-08-14 DIAGNOSIS — J449 Chronic obstructive pulmonary disease, unspecified: Secondary | ICD-10-CM | POA: Diagnosis not present

## 2024-08-14 DIAGNOSIS — M810 Age-related osteoporosis without current pathological fracture: Secondary | ICD-10-CM | POA: Diagnosis not present

## 2024-08-14 DIAGNOSIS — I50813 Acute on chronic right heart failure: Secondary | ICD-10-CM | POA: Diagnosis not present

## 2024-08-14 DIAGNOSIS — I2510 Atherosclerotic heart disease of native coronary artery without angina pectoris: Secondary | ICD-10-CM | POA: Diagnosis not present

## 2024-08-17 ENCOUNTER — Encounter: Payer: Self-pay | Admitting: Neurology

## 2024-08-22 ENCOUNTER — Encounter: Payer: Self-pay | Admitting: Physician Assistant

## 2024-08-22 ENCOUNTER — Ambulatory Visit: Attending: Physician Assistant | Admitting: Physician Assistant

## 2024-08-22 VITALS — BP 127/88 | HR 91 | Temp 97.4°F | Resp 18 | Ht 66.0 in | Wt 179.8 lb

## 2024-08-22 DIAGNOSIS — R5383 Other fatigue: Secondary | ICD-10-CM

## 2024-08-22 DIAGNOSIS — I2510 Atherosclerotic heart disease of native coronary artery without angina pectoris: Secondary | ICD-10-CM

## 2024-08-22 DIAGNOSIS — M797 Fibromyalgia: Secondary | ICD-10-CM | POA: Diagnosis not present

## 2024-08-22 DIAGNOSIS — M7061 Trochanteric bursitis, right hip: Secondary | ICD-10-CM

## 2024-08-22 DIAGNOSIS — F5101 Primary insomnia: Secondary | ICD-10-CM | POA: Diagnosis not present

## 2024-08-22 DIAGNOSIS — K219 Gastro-esophageal reflux disease without esophagitis: Secondary | ICD-10-CM

## 2024-08-22 DIAGNOSIS — M545 Low back pain, unspecified: Secondary | ICD-10-CM

## 2024-08-22 DIAGNOSIS — I5032 Chronic diastolic (congestive) heart failure: Secondary | ICD-10-CM

## 2024-08-22 DIAGNOSIS — G8929 Other chronic pain: Secondary | ICD-10-CM

## 2024-08-22 DIAGNOSIS — M810 Age-related osteoporosis without current pathological fracture: Secondary | ICD-10-CM

## 2024-08-22 DIAGNOSIS — I272 Pulmonary hypertension, unspecified: Secondary | ICD-10-CM

## 2024-08-22 DIAGNOSIS — Z8669 Personal history of other diseases of the nervous system and sense organs: Secondary | ICD-10-CM

## 2024-08-22 DIAGNOSIS — G5603 Carpal tunnel syndrome, bilateral upper limbs: Secondary | ICD-10-CM

## 2024-08-22 DIAGNOSIS — M62838 Other muscle spasm: Secondary | ICD-10-CM

## 2024-08-22 DIAGNOSIS — Z8709 Personal history of other diseases of the respiratory system: Secondary | ICD-10-CM

## 2024-08-22 DIAGNOSIS — M7062 Trochanteric bursitis, left hip: Secondary | ICD-10-CM

## 2024-08-22 DIAGNOSIS — I10 Essential (primary) hypertension: Secondary | ICD-10-CM

## 2024-08-22 DIAGNOSIS — D472 Monoclonal gammopathy: Secondary | ICD-10-CM

## 2024-08-22 DIAGNOSIS — E559 Vitamin D deficiency, unspecified: Secondary | ICD-10-CM

## 2024-08-22 DIAGNOSIS — Z87891 Personal history of nicotine dependence: Secondary | ICD-10-CM

## 2024-08-22 DIAGNOSIS — F419 Anxiety disorder, unspecified: Secondary | ICD-10-CM

## 2024-08-22 MED ORDER — LIDOCAINE HCL 1 % IJ SOLN
0.5000 mL | INTRAMUSCULAR | Status: AC | PRN
  Administered 2024-08-22: .5 mL

## 2024-08-22 MED ORDER — TRIAMCINOLONE ACETONIDE 40 MG/ML IJ SUSP
10.0000 mg | INTRAMUSCULAR | Status: AC | PRN
  Administered 2024-08-22: 10 mg via INTRAMUSCULAR

## 2024-09-11 ENCOUNTER — Encounter: Payer: Self-pay | Admitting: Neurology

## 2024-09-11 ENCOUNTER — Ambulatory Visit: Admitting: Neurology

## 2024-09-11 ENCOUNTER — Ambulatory Visit (INDEPENDENT_AMBULATORY_CARE_PROVIDER_SITE_OTHER): Admitting: Neurology

## 2024-09-11 VITALS — BP 127/85 | HR 104 | Ht 66.0 in | Wt 175.0 lb

## 2024-09-11 DIAGNOSIS — J449 Chronic obstructive pulmonary disease, unspecified: Secondary | ICD-10-CM

## 2024-09-11 DIAGNOSIS — G253 Myoclonus: Secondary | ICD-10-CM | POA: Diagnosis not present

## 2024-09-11 DIAGNOSIS — M797 Fibromyalgia: Secondary | ICD-10-CM

## 2024-09-11 DIAGNOSIS — R251 Tremor, unspecified: Secondary | ICD-10-CM

## 2024-09-11 NOTE — Patient Instructions (Signed)
 Good to see you today!  We discussed that your:  1.  Your creatinine has changed and that change likely has caused some of this twitching/jerking, in addition to the fact that you are on gabapentin .  The gabapentin  level likely has risen in your system because of this creatinine rise and that causes this twitching.  You can follow up with your primary doc about the increase in creatinine and also the person who prescribes the gabapentin , as that can be tapered some and it may help your symptoms  2.  Your tremor when you hold your phone is likely due to your lung meds and there is not much to do about that.  The physicians and staff at [facility] are committed to providing excellent care. You may receive a survey requesting feedback about your experience at our office. We strive to receive very good responses to the survey questions. If you feel that your experience would prevent you from giving the office a very good  response, please contact our office to try to remedy the situation. We may be reached at [phone]. Thank you for taking the time out of your busy day to complete the survey.

## 2024-09-11 NOTE — Progress Notes (Signed)
 Assessment/Plan:   1.  Abnormal involuntary movement  - I did not see any myoclonus on her examination today, but suspect that she is having myoclonus.  Her creatinine went from 0.79 in January to her most recent creatinine at 1.47 a few weeks ago.  This correlates well with the timeline of her symptoms.  Besides for medication, the most common cause of intermittent myoclonus is renal insufficiency.  One of the most common offenders medication wise is gabapentin .  She is on gabapentin , 800 mg twice per day, and it is likely that the gabapentin  level is rising because of renal insufficiency.  I would recommend decreasing the dosage of this medication while the cause of renal insufficiency is explored.  She will talk to her primary care physician about these things.  - Patient does have a little bit of mild intention tremor, which is likely a consequence of her lung medications for COPD.  I would not recommend further medication for that.  - Patient was worried that she had some other disorder setting in and I reassured her that I did not think that she had seizures or any other type of neurodegenerative process.  No evidence of Parkinsons disease.  2.  COPD  -on O2  -continues to smoke tobacco, but is trying to decrease.  3.  Fibromyalgia  -following with rheum  -on trigger point injections  Subjective:   Teresa Roberts was seen today in the movement disorders clinic for neurologic consultation at the request of Chet Mad, DO.  The consultation is for the evaluation of tremor.  Outside records that were made available to me were reviewed.  Patient is a 63 y.o. female with a history of hyperlipidemia, migraine (follows with [facility]) coronary artery disease, sleep apnea, fibromyalgia who presents today with tremor.  However, patient states that she will twitch and jerk of the body and both hands.  It can occur randomly and has been going on for 6 months.  It seemed to start  gradually.  It happens daily.  It is both sides of the body.  If she is standing when she jerks, she could fall but usually doesn't b/c she has a rollator.  Most of the jerking spells happen with laying down.  She has COPD and is on albuterol  (usually once per day) and was on Trelegy Ellipta  but the [place] switched this out to something else.  She is on topamax  for migraine but only takes it prn.  She rarely takes it as the gabapentin  helps for migraine and fibromyalgia.  patient did have a fall at the end of September, but this was really a syncopal episode and is undergoing cardiac workup and a Zio patch was ordered and done but she doesn't know the results.  She states that she does have a cardiologist.  She does have a history of syncope and was told it was sometimes her air flow but sometimes the legs give out.   ALLERGIES:   Allergies  Allergen Reactions   Citrus Hives   Lisinopril Cough    CURRENT MEDICATIONS:  Current Outpatient Medications  Medication Instructions   albuterol  (VENTOLIN  HFA) 108 (90 Base) MCG/ACT inhaler 2 puffs, Inhalation, Every 6 hours PRN   alendronate  (FOSAMAX ) 70 mg, Oral, Weekly, Take with a full glass of water on an empty stomach.   aspirin  EC 81 mg, Daily   busPIRone  (BUSPAR ) 10 mg, Oral, 3 times daily   dorzolamide-timolol (COSOPT) 22.3-6.8 MG/ML ophthalmic solution 2 drops, Daily  DULoxetine  (CYMBALTA ) 30 mg, Daily   DULoxetine  (CYMBALTA ) 60 mg, Daily   furosemide  (LASIX ) 40 mg, Oral, As needed   gabapentin  (NEURONTIN ) 800 mg, 2 times daily   ipratropium-albuterol  (DUONEB) 0.5-2.5 (3) MG/3ML SOLN 3 mLs, Nebulization, 4 times daily   OXYGEN  8 L, Continuous   simvastatin  (ZOCOR ) 10 mg, Daily at bedtime   tiZANidine  (ZANAFLEX ) 4 MG tablet TAKE 1 TABLET BY MOUTH EVERY DAY AT BEDTIME AS NEEDED FOR MUSCLE SPASMS   topiramate  (TOPAMAX ) 25 mg, As needed   traZODone  (DESYREL ) 150 mg, Oral, Daily at bedtime   TRELEGY ELLIPTA  100-62.5-25 MCG/ACT AEPB INHALE 1  PUFF BY MOUTH EVERY DAY   Vitamin D  (Ergocalciferol ) (DRISDOL) 50,000 Units, Weekly    Objective:   PHYSICAL EXAMINATION:    VITALS:   Vitals:   09/11/24 1006  BP: 127/85  Pulse: (!) 104  SpO2: 95%  Weight: 175 lb (79.4 kg)  Height: 5' 6 (1.676 m)    GEN:  The patient appears stated age and is in NAD. HEENT:  Normocephalic, atraumatic.  The mucous membranes are moist. The superficial temporal arteries are without ropiness or tenderness. CV:  RRR Lungs:  CTAB.  There is some DOE.  She is wearing oxygen . Neck/HEME:  There are no carotid bruits bilaterally.  Neurological examination:  Orientation: The patient is alert and oriented x3.  Cranial nerves: There is good facial symmetry.  Extraocular muscles are intact. The visual fields are full to confrontational testing. The speech is fluent and clear. Soft palate rises symmetrically and there is no tongue deviation. Hearing is intact to conversational tone. Sensation: Sensation is intact to light touch throughout (facial, trunk, extremities). Vibration is intact at the bilateral ankle. There is no extinction with double simultaneous stimulation.  Motor: Strength is 5/5 in the bilateral upper and lower extremities.   Shoulder shrug is equal and symmetric.  There is no pronator drift. Deep tendon reflexes: Deep tendon reflexes are 1/4 at the bilateral biceps, triceps, brachioradialis, patella and achilles. Plantar responses are downgoing bilaterally.  Movement examination: Tone: There is normal tone in the bilateral upper extremities.  The tone in the lower extremities is normal.  Abnormal movements: There is no rest tremor.  There is minimal postural and intention tremor.  There is no asterixis.  No myoclonus seen today.  Minimal trouble with Archimedes spirals. Coordination:  There is no decremation with RAM's, with any form of RAMS, including alternating supination and pronation of the forearm, hand opening and closing, finger taps,  heel taps and toe taps.  Gait and Station: The patient has now difficulty arising out of a deep-seated chair without the use of the hands. The patient's stride length is good.   I have reviewed and interpreted the following labs independently   Chemistry      Component Value Date/Time   NA 139 11/18/2023 0339   NA 143 07/12/2018 1436   NA 141 09/30/2017 0954   K 4.5 11/18/2023 0339   K 3.7 09/30/2017 0954   CL 101 11/18/2023 0339   CO2 28 11/18/2023 0339   CO2 24 09/30/2017 0954   BUN 13 11/18/2023 0339   BUN 17 07/12/2018 1436   BUN 8.8 09/30/2017 0954   CREATININE 0.79 11/18/2023 0339   CREATININE 0.97 09/26/2023 1318   CREATININE 1.04 07/13/2023 1337   CREATININE 0.8 09/30/2017 0954      Component Value Date/Time   CALCIUM  9.6 11/18/2023 0339   CALCIUM  10.4 09/30/2017 0954   ALKPHOS 95 11/17/2023  0650   ALKPHOS 161 (H) 09/30/2017 0954   AST 22 11/17/2023 0650   AST 17 09/26/2023 1318   AST 22 09/30/2017 0954   ALT 15 11/17/2023 0650   ALT 8 09/26/2023 1318   ALT 32 09/30/2017 0954   BILITOT 0.3 11/17/2023 0650   BILITOT 0.2 09/26/2023 1318   BILITOT 0.29 09/30/2017 0954      Lab Results  Component Value Date   TSH 0.85 07/13/2023   Lab Results  Component Value Date   WBC 10.1 11/18/2023   HGB 9.8 (L) 11/18/2023   HCT 32.9 (L) 11/18/2023   MCV 92.2 11/18/2023   PLT 172 11/18/2023   She had lab work with her primary care physician on October 2.  That is reviewed.  TSH was 0.34.  Sodium was 144, potassium 3.4, chloride 97, CO2 37, BUN 12, creatinine was elevated at 1.47 (compared to 0.79 on November 18, 2023).  Her white blood cells were 5.8, hemoglobin 11.8, hematocrit 36.4 and platelets 219.  Total time spent on today's visit was 53 minutes, including both face-to-face time and nonface-to-face time.  Time included that spent on review of records (prior notes available to me/labs/imaging if pertinent), discussing treatment and goals, answering patient's questions  and coordinating care.  Cc:  Verena Mems, MD

## 2024-10-01 ENCOUNTER — Other Ambulatory Visit: Payer: Self-pay | Admitting: Rheumatology

## 2024-10-01 MED ORDER — DULOXETINE HCL 60 MG PO CPEP: 60.0000 mg | ORAL_CAPSULE | Freq: Every day | ORAL | 0 refills | Status: AC

## 2024-10-01 NOTE — Telephone Encounter (Signed)
 Last Fill: we have not filled since 11/2022, Dr. Joesph Likes has been prescribing since.   Next Visit: 11/01/2024  Last Visit: 08/22/2024  DX: Fibromyalgia   Current Dose per office note on 08/22/2024: Cymbalta  as prescribed.   I called the patient and advised that she needed to contact Dr. Joesph Likes for this refill and patient did not recall who that was. I looked up the doctors information and advised patient she works at [employer] [facility].  Patient states she is not going to pain management any longer and would like Dr. Samule to take the Cymbalta  prescription back over. Please advise.   According to the dispense hx, Cymbalta  60mg  was the last rx filled in 01/2024.  Okay to refill Cymbalta ?

## 2024-10-01 NOTE — Telephone Encounter (Signed)
 Patient contacted the office to request a medication refill.   1. Name of Medication: Duloxetine   2. How are you currently taking this medication (dosage and times per day)? 1 tablet daily   3. What pharmacy would you like for that to be sent to? [facility] [address] in [place]

## 2024-10-18 ENCOUNTER — Ambulatory Visit: Admitting: Neurology

## 2024-10-22 NOTE — Progress Notes (Signed)
 Office Visit Note  Patient: Teresa Roberts             Date of Birth: Apr 17, 1961           MRN: [id number]             PCP: Verena Mems, MD Referring: Verena Mems, MD Visit Date: 11/01/2024 Occupation: Data Unavailable  Subjective:  Pain in both hands and trapezius spasm  History of Present Illness: Davi Rotan is a 63 y.o. female with fibromyalgia and osteoarthritis.  She returns today after her last visit on August 22, 2024.  She states she has been having increased pain and discomfort in her both hands.  She does not recall doing any strenuous activity.  She also has discomfort in her bilateral trapezius region.  She states the trigger point injections lasted only for short time and the pain has recurred.  She has been taking tizanidine  4 mg p.o. nightly for muscle spasms.    Activities of Daily Living:  Patient reports morning stiffness for all day. Patient Reports nocturnal pain.  Difficulty dressing/grooming: Reports Difficulty climbing stairs: Reports Difficulty getting out of chair: Denies Difficulty using hands for taps, buttons, cutlery, and/or writing: Reports  Review of Systems  Constitutional:  Positive for fatigue.  HENT:  Negative for mouth sores and mouth dryness.   Eyes:  Negative for dryness.  Respiratory:  Positive for shortness of breath.   Cardiovascular:  Negative for chest pain and palpitations.  Gastrointestinal:  Negative for blood in stool, constipation and diarrhea.  Endocrine: Negative for increased urination.  Genitourinary:  Negative for involuntary urination.  Musculoskeletal:  Positive for joint pain, gait problem, joint pain, myalgias, muscle weakness, morning stiffness, muscle tenderness and myalgias. Negative for joint swelling.  Skin:  Negative for color change, rash, hair loss and sensitivity to sunlight.  Allergic/Immunologic: Negative for susceptible to infections.  Neurological:  Positive for dizziness and headaches.  Hematological:   Negative for swollen glands.  Psychiatric/Behavioral:  Positive for depressed mood and sleep disturbance. The patient is nervous/anxious.     PMFS History:  Patient Active Problem List   Diagnosis Date Noted   Respiratory syncytial virus (RSV) infection 11/18/2023   Acute and chronic respiratory failure (acute-on-chronic) (HCC) 11/17/2023   B12 deficiency 09/13/2023   Sciatica of left side associated with disorder of lumbar spine 09/13/2022   Chronic pain syndrome 09/13/2022   History of colonic polyps 01/01/2021   Esophageal reflux 01/01/2021   Hypercalcemia 09/23/2020   Hyperparathyroidism, primary 08/25/2020   Iron  deficiency anemia 06/27/2019   Coronary artery disease involving native coronary artery of native heart without angina pectoris 09/05/2018   Pain in joint of left elbow 09/04/2018   HLD (hyperlipidemia) 05/27/2018   Essential hypertension 05/27/2018   Depression 05/27/2018   OSA (obstructive sleep apnea) 05/03/2018   Pulmonary hypertension (HCC) 05/03/2018   Microcytic anemia 10/19/2017   Chronic diastolic (congestive) heart failure (HCC) 08/09/2017   Other fatigue 06/13/2017   Primary insomnia 06/13/2017   History of migraine 06/13/2017   COPD with acute exacerbation (HCC) 02/15/2017   Tobacco abuse 02/15/2017   Chronic respiratory failure with hypoxia, on home O2 therapy (HCC) - on 6-8 liters/min 01/28/2017   Migraine 01/28/2017   Daily headache    MGUS (monoclonal gammopathy of unknown significance) 01/11/2012   Fibromyalgia 01/11/2012   Bruises easily 01/11/2012    Past Medical History:  Diagnosis Date   Arthritis    Carpal tunnel syndrome    COPD (chronic obstructive pulmonary  disease) (HCC)    O2 dependent   Depression    PTSD   Fibromyalgia    Hyperlipidemia    Hypertension    off of x 4 years   Migraines    Osteoporosis    Sciatic nerve disease     Family History  Problem Relation Age of Onset   Diabetes Mother    Hypertension Mother     CAD Mother    Diabetes Father    Lung cancer Father    Diabetes Sister    Healthy Son    Hypercalcemia Neg Hx    Breast cancer Neg Hx    Past Surgical History:  Procedure Laterality Date   APPENDECTOMY     BIOPSY  01/01/2021   Procedure: BIOPSY;  Surgeon: Dianna Specking, MD;  Location: [facility];  Service: Endoscopy;;   CESAREAN SECTION     COLONOSCOPY WITH PROPOFOL  N/A 01/01/2021   Procedure: COLONOSCOPY WITH PROPOFOL ;  Surgeon: Dianna Specking, MD;  Location: [facility];  Service: Endoscopy;  Laterality: N/A;   ESOPHAGOGASTRODUODENOSCOPY (EGD) WITH PROPOFOL  N/A 01/01/2021   Procedure: ESOPHAGOGASTRODUODENOSCOPY (EGD) WITH PROPOFOL ;  Surgeon: Dianna Specking, MD;  Location: [facility];  Service: Endoscopy;  Laterality: N/A;   HEMORRHOID SURGERY N/A 01/27/2024   Procedure: HEMORRHOIDECTOMY;  Surgeon: Debby Hila, MD;  Location: [facility];  Service: General;  Laterality: N/A;   ORIF ANKLE FRACTURE Left 08/12/2017   Procedure: OPEN REDUCTION INTERNAL FIXATION (ORIF) ANKLE FRACTURE;  Surgeon: Fidel Rogue, MD;  Location: [facility];  Service: Orthopedics;  Laterality: Left;   RIGHT HEART CATH N/A 07/19/2018   Procedure: RIGHT HEART CATH;  Surgeon: Cherrie Toribio SAUNDERS, MD;  Location: [facility];  Service: Cardiovascular;  Laterality: N/A;   RIGHT/LEFT HEART CATH AND CORONARY ANGIOGRAPHY N/A 01/27/2017   Procedure: Right/Left Heart Cath and Coronary Angiography;  Surgeon: Debby DELENA Sor, MD;  Location: [facility];  Service: Cardiovascular;  Laterality: N/A;   Social History   Tobacco Use   Smoking status: Some Days    Current packs/day: 0.50    Types: Cigarettes    Passive exposure: Never   Smokeless tobacco: Never  Vaping Use   Vaping status: Never Used  Substance Use Topics   Alcohol  use: No   Drug use: Not Currently    Types: Marijuana   Social History   Social History Narrative   Right handed      Immunization History  Administered Date(s)  Administered   Influenza Split 08/15/2017, 08/28/2018, 08/24/2019, 08/16/2023   Influenza,inj,Quad PF,6+ Mos 12/28/2016, 09/28/2017, 09/04/2018, 08/03/2019, 08/29/2020, 08/03/2021, 08/11/2022   Influenza,inj,quad, With Preservative 08/09/2014   Influenza-Unspecified 08/15/2016   Moderna Sars-Covid-2 Vaccination 02/11/2020, 03/10/2020, 07/11/2020   Pneumococcal Polysaccharide-23 02/04/2014, 08/31/2016   Pneumococcal-Unspecified 02/13/2013   Respiratory Syncytial Virus Vaccine ,Recomb Aduvanted(Arexvy ) 08/11/2022   Tdap 04/25/2013, 02/04/2014   Zoster, Live 05/31/2019, 08/03/2019     Objective: Vital Signs: BP 112/79   Pulse 94   Temp 97.6 F (36.4 C)   Resp 17   Ht 5' 6 (1.676 m)   Wt 174 lb 3.2 oz (79 kg)   LMP 08/19/2012   BMI 28.12 kg/m    Physical Exam Vitals and nursing note reviewed.  Constitutional:      Appearance: She is well-developed.  HENT:     Head: Normocephalic and atraumatic.  Eyes:     Conjunctiva/sclera: Conjunctivae normal.  Cardiovascular:     Rate and Rhythm: Normal rate and regular rhythm.     Heart sounds: Normal heart  sounds.  Pulmonary:     Effort: Pulmonary effort is normal.     Breath sounds: Normal breath sounds.  Abdominal:     General: Bowel sounds are normal.     Palpations: Abdomen is soft.  Musculoskeletal:     Cervical back: Normal range of motion.  Lymphadenopathy:     Cervical: No cervical adenopathy.  Skin:    General: Skin is warm and dry.     Capillary Refill: Capillary refill takes less than 2 seconds.  Neurological:     Mental Status: She is alert and oriented to person, place, and time.  Psychiatric:        Behavior: Behavior normal.      Musculoskeletal Exam: She had limited lateral rotation of the cervical spine.  She had bilateral trapezius spasm.  There was no tenderness over thoracic or lumbar spine.  Shoulder joint abduction was limited to 90 degrees and had limited internal rotation.  Elbows, wrist joints, MCPs  PIPs and DIPs were in good range of motion with no synovitis.  Hip joints and knee joints in good range of motion without any warmth swelling or effusion.  There was no tenderness over ankles or MTPs.  CDAI Exam: CDAI Score: -- Patient Global: --; Provider Global: -- Swollen: --; Tender: -- Joint Exam 11/01/2024   No joint exam has been documented for this visit   There is currently no information documented on the homunculus. Go to the Rheumatology activity and complete the homunculus joint exam.  Investigation: No additional findings.  Imaging: No results found.  Recent Labs: Lab Results  Component Value Date   WBC 10.1 11/18/2023   HGB 9.8 (L) 11/18/2023   PLT 172 11/18/2023   NA 139 11/18/2023   K 4.5 11/18/2023   CL 101 11/18/2023   CO2 28 11/18/2023   GLUCOSE 128 (H) 11/18/2023   BUN 13 11/18/2023   CREATININE 0.79 11/18/2023   BILITOT 0.3 11/17/2023   ALKPHOS 95 11/17/2023   AST 22 11/17/2023   ALT 15 11/17/2023   PROT 7.1 11/17/2023   ALBUMIN 3.5 11/17/2023   CALCIUM  9.6 11/18/2023   GFRAA >60 08/27/2019    Speciality Comments: No specialty comments available.  Procedures:  Trigger Point Inj  Date/Time: 11/01/2024 2:03 PM  Performed by: Dolphus Reiter, MD Authorized by: Dolphus Reiter, MD   Consent Given by:  Patient Site marked: the procedure site was marked   Timeout: prior to procedure the correct patient, procedure, and site was verified   Indications:  Muscle spasm and pain Total # of Trigger Points:  2 Location: neck   Needle Size:  27 G Approach:  Dorsal Medications #1:  0.5 mL lidocaine  1 %; 20 mg triamcinolone  acetonide 40 MG/ML Medications #2:  0.5 mL lidocaine  1 %; 20 mg triamcinolone  acetonide 40 MG/ML Patient tolerance:  Patient tolerated the procedure well with no immediate complications Comments: Risk of infection, tendon injury, nerve injury, hypopigmentation and dermal atrophy were discussed.  Allergies: Citrus and  Lisinopril   Assessment / Plan:     Visit Diagnoses: Fibromyalgia -she continues to have generalized pain and discomfort.  She is in constant pain.  She will follow-up with her PCP to discuss treatment options.  I offered referral to pain management but she declined.  Generalized hyperalgesia and positive tender points were noted.   She is on duloxetine  60 mg p.o. daily, gabapentin  and Topamax .  Other fatigue-she continues to have fatigue.  Trapezius muscle spasm-she had bilateral trapezius spasm.  Patient requested trigger point injections.  She states the last trigger point injections did not last for long.  After informed consent was obtained bilateral trapezius area was injected with lidocaine  and Kenalog  as described above.  She tolerated the procedure well.  Postprocedure instructions were given.  Primary insomnia -with sleep hygiene was discussed.  She is on trazodone  150 mg by mouth at bedtime for insomnia.  Bilateral carpal tunnel syndrome - Bilateral mild carpal tunnel syndrome was noted on the nerve conduction velocities 2023 by Dr. Eldonna.  Trochanteric bursitis of both hips-she continues to have intermittent discomfort.  IT band stretches were discussed.  Chronic left-sided low back pain without sciatica-she continues to have lower back pain.  Age-related osteoporosis without current pathological fracture - 10/20/22 DEXA scan BMD 0.492, T-score -3.4 left forearm radius 33%.  She is supposed to get repeat DEXA scan.  Need for regular exercise, and muscle strengthening discussed.  Calcium  rich diet was encouraged.  Other medical problems listed as follows:  History of migraine  Vitamin D  deficiency-she is on vitamin D  supplement.  History of COPD  Former smoker  Anxiety  Gastroesophageal reflux disease, unspecified whether esophagitis present  Chronic diastolic (congestive) heart failure (HCC)  Essential hypertension  Pulmonary hypertension (HCC)  Coronary artery  disease involving native coronary artery of native heart without angina pectoris  MGUS (monoclonal gammopathy of unknown significance)  Orders: Orders Placed This Encounter  Procedures   Trigger Point Inj   No orders of the defined types were placed in this encounter.  Follow-Up Instructions: Return in about 6 months (around 05/02/2025) for Osteoarthritis.   Maya Nash, MD  Note - This record has been created using Animal nutritionist.  Chart creation errors have been sought, but may not always  have been located. Such creation errors do not reflect on  the standard of medical care.

## 2024-10-26 ENCOUNTER — Other Ambulatory Visit: Payer: Self-pay | Admitting: Cardiology

## 2024-11-01 ENCOUNTER — Ambulatory Visit: Attending: Rheumatology | Admitting: Rheumatology

## 2024-11-01 ENCOUNTER — Encounter: Payer: Self-pay | Admitting: Rheumatology

## 2024-11-01 VITALS — BP 112/79 | HR 94 | Temp 97.6°F | Resp 17 | Ht 66.0 in | Wt 174.2 lb

## 2024-11-01 DIAGNOSIS — Z8709 Personal history of other diseases of the respiratory system: Secondary | ICD-10-CM

## 2024-11-01 DIAGNOSIS — G8929 Other chronic pain: Secondary | ICD-10-CM

## 2024-11-01 DIAGNOSIS — Z87891 Personal history of nicotine dependence: Secondary | ICD-10-CM | POA: Diagnosis not present

## 2024-11-01 DIAGNOSIS — K219 Gastro-esophageal reflux disease without esophagitis: Secondary | ICD-10-CM

## 2024-11-01 DIAGNOSIS — F419 Anxiety disorder, unspecified: Secondary | ICD-10-CM

## 2024-11-01 DIAGNOSIS — G5603 Carpal tunnel syndrome, bilateral upper limbs: Secondary | ICD-10-CM

## 2024-11-01 DIAGNOSIS — I272 Pulmonary hypertension, unspecified: Secondary | ICD-10-CM

## 2024-11-01 DIAGNOSIS — F5101 Primary insomnia: Secondary | ICD-10-CM | POA: Diagnosis not present

## 2024-11-01 DIAGNOSIS — M545 Low back pain, unspecified: Secondary | ICD-10-CM

## 2024-11-01 DIAGNOSIS — Z8669 Personal history of other diseases of the nervous system and sense organs: Secondary | ICD-10-CM | POA: Diagnosis not present

## 2024-11-01 DIAGNOSIS — M7061 Trochanteric bursitis, right hip: Secondary | ICD-10-CM

## 2024-11-01 DIAGNOSIS — M810 Age-related osteoporosis without current pathological fracture: Secondary | ICD-10-CM | POA: Diagnosis not present

## 2024-11-01 DIAGNOSIS — I5032 Chronic diastolic (congestive) heart failure: Secondary | ICD-10-CM

## 2024-11-01 DIAGNOSIS — I2510 Atherosclerotic heart disease of native coronary artery without angina pectoris: Secondary | ICD-10-CM

## 2024-11-01 DIAGNOSIS — M7062 Trochanteric bursitis, left hip: Secondary | ICD-10-CM

## 2024-11-01 DIAGNOSIS — M62838 Other muscle spasm: Secondary | ICD-10-CM

## 2024-11-01 DIAGNOSIS — M797 Fibromyalgia: Secondary | ICD-10-CM

## 2024-11-01 DIAGNOSIS — E559 Vitamin D deficiency, unspecified: Secondary | ICD-10-CM | POA: Diagnosis not present

## 2024-11-01 DIAGNOSIS — R5383 Other fatigue: Secondary | ICD-10-CM | POA: Diagnosis not present

## 2024-11-01 DIAGNOSIS — D472 Monoclonal gammopathy: Secondary | ICD-10-CM

## 2024-11-01 DIAGNOSIS — I10 Essential (primary) hypertension: Secondary | ICD-10-CM

## 2024-11-01 MED ORDER — TRIAMCINOLONE ACETONIDE 40 MG/ML IJ SUSP
20.0000 mg | INTRAMUSCULAR | Status: AC | PRN
  Administered 2024-11-01: 14:00:00 20 mg via INTRAMUSCULAR

## 2024-11-01 MED ORDER — LIDOCAINE HCL 1 % IJ SOLN
0.5000 mL | INTRAMUSCULAR | Status: AC | PRN
  Administered 2024-11-01: 14:00:00 .5 mL

## 2024-11-01 MED ADMIN — Triamcinolone Acetonide Inj Susp 40 MG/ML: 20 mg | INTRAMUSCULAR | @ 14:00:00 | NDC 70121116801

## 2024-11-01 MED ADMIN — Lidocaine HCl Local Inj 1%: .5 mL | @ 14:00:00 | NDC 00409427617

## 2025-02-20 ENCOUNTER — Ambulatory Visit: Admitting: Rheumatology

## 2025-05-02 ENCOUNTER — Ambulatory Visit: Admitting: Rheumatology
